# Patient Record
Sex: Female | Born: 1990 | Race: Black or African American | Hispanic: No | Marital: Single | Smoking: Never smoker
Health system: Southern US, Community
[De-identification: ages and names within clinical notes are randomized; demographics above are authoritative.]

## PROBLEM LIST (undated history)

## (undated) ENCOUNTER — Inpatient Hospital Stay (HOSPITAL_COMMUNITY): Payer: Self-pay

## (undated) ENCOUNTER — Emergency Department (HOSPITAL_COMMUNITY): Discharge status: Against Medical Advice | Payer: No Typology Code available for payment source

## (undated) DIAGNOSIS — D509 Iron deficiency anemia, unspecified: Secondary | ICD-10-CM

## (undated) DIAGNOSIS — R10A3 Flank pain, bilateral: Secondary | ICD-10-CM

## (undated) DIAGNOSIS — T8859XA Other complications of anesthesia, initial encounter: Secondary | ICD-10-CM

## (undated) DIAGNOSIS — N182 Chronic kidney disease, stage 2 (mild): Secondary | ICD-10-CM

## (undated) DIAGNOSIS — D126 Benign neoplasm of colon, unspecified: Secondary | ICD-10-CM

## (undated) DIAGNOSIS — R011 Cardiac murmur, unspecified: Secondary | ICD-10-CM

## (undated) DIAGNOSIS — E876 Hypokalemia: Secondary | ICD-10-CM

## (undated) DIAGNOSIS — Z8759 Personal history of other complications of pregnancy, childbirth and the puerperium: Secondary | ICD-10-CM

## (undated) DIAGNOSIS — Z932 Ileostomy status: Secondary | ICD-10-CM

## (undated) DIAGNOSIS — T4145XA Adverse effect of unspecified anesthetic, initial encounter: Secondary | ICD-10-CM

## (undated) DIAGNOSIS — Z87442 Personal history of urinary calculi: Secondary | ICD-10-CM

## (undated) DIAGNOSIS — D649 Anemia, unspecified: Secondary | ICD-10-CM

## (undated) DIAGNOSIS — R109 Unspecified abdominal pain: Secondary | ICD-10-CM

## (undated) DIAGNOSIS — Z8619 Personal history of other infectious and parasitic diseases: Secondary | ICD-10-CM

## (undated) DIAGNOSIS — Z87448 Personal history of other diseases of urinary system: Secondary | ICD-10-CM

## (undated) DIAGNOSIS — N281 Cyst of kidney, acquired: Secondary | ICD-10-CM

## (undated) DIAGNOSIS — R3915 Urgency of urination: Secondary | ICD-10-CM

## (undated) DIAGNOSIS — N200 Calculus of kidney: Secondary | ICD-10-CM

## (undated) DIAGNOSIS — C189 Malignant neoplasm of colon, unspecified: Secondary | ICD-10-CM

## (undated) HISTORY — PX: LEG RECONSTRUCTION USING FASCIAL FLAP: SHX1962

## (undated) HISTORY — PX: ABDOMINAL SURGERY: SHX537

## (undated) HISTORY — PX: OTHER SURGICAL HISTORY: SHX169

---
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
## 2009-05-31 ENCOUNTER — Inpatient Hospital Stay (HOSPITAL_COMMUNITY): Admission: AD | Admit: 2009-05-31 | Discharge: 2009-05-31 | Payer: Self-pay | Admitting: Pediatrics

## 2010-05-04 ENCOUNTER — Emergency Department (HOSPITAL_COMMUNITY): Admission: EM | Admit: 2010-05-04 | Discharge: 2010-05-04 | Payer: Self-pay | Admitting: Family Medicine

## 2010-08-18 ENCOUNTER — Emergency Department (HOSPITAL_COMMUNITY): Admission: EM | Admit: 2010-08-18 | Discharge: 2010-08-18 | Payer: Self-pay | Admitting: Emergency Medicine

## 2010-08-23 ENCOUNTER — Inpatient Hospital Stay (HOSPITAL_COMMUNITY): Admission: RE | Admit: 2010-08-23 | Discharge: 2010-08-24 | Payer: Self-pay | Admitting: Orthopedic Surgery

## 2010-08-23 HISTORY — PX: IM NAILING TIBIA: SUR734

## 2011-02-01 ENCOUNTER — Other Ambulatory Visit: Payer: Self-pay | Admitting: Obstetrics and Gynecology

## 2011-02-09 LAB — TYPE AND SCREEN
ABO/RH(D): A POS
Antibody Screen: NEGATIVE

## 2011-02-09 LAB — BASIC METABOLIC PANEL
BUN: 3 mg/dL — ABNORMAL LOW (ref 6–23)
CO2: 27 mEq/L (ref 19–32)
Calcium: 8.6 mg/dL (ref 8.4–10.5)
Chloride: 107 mEq/L (ref 96–112)
Creatinine, Ser: 0.6 mg/dL (ref 0.4–1.2)
GFR calc Af Amer: >60 mL/min (ref >60)
GFR calc non Af Amer: >60 mL/min (ref >60)
Glucose, Bld: 119 mg/dL — ABNORMAL HIGH (ref 70–99)
Potassium: 3.5 mEq/L (ref 3.5–5.1)
Sodium: 137 mEq/L (ref 135–145)

## 2011-02-09 LAB — CBC
HCT: 25.4 % — ABNORMAL LOW (ref 36.0–46.0)
HCT: 28.5 % — ABNORMAL LOW (ref 36.0–46.0)
Hemoglobin: 7.3 g/dL — ABNORMAL LOW (ref 12.0–15.0)
Hemoglobin: 8.2 g/dL — ABNORMAL LOW (ref 12.0–15.0)
MCH: 17.1 pg — ABNORMAL LOW (ref 26.0–34.0)
MCH: 17.4 pg — ABNORMAL LOW (ref 26.0–34.0)
MCHC: 28.7 g/dL — ABNORMAL LOW (ref 30.0–36.0)
MCHC: 28.8 g/dL — ABNORMAL LOW (ref 30.0–36.0)
MCV: 59.5 fL — ABNORMAL LOW (ref 78.0–100.0)
MCV: 60.5 fL — ABNORMAL LOW (ref 78.0–100.0)
Platelets: 315 10*3/uL (ref 150–400)
Platelets: 363 10*3/uL (ref 150–400)
RBC: 4.27 MIL/uL (ref 3.87–5.11)
RBC: 4.71 MIL/uL (ref 3.87–5.11)
RDW: 17.6 % — ABNORMAL HIGH (ref 11.5–15.5)
RDW: 17.7 % — ABNORMAL HIGH (ref 11.5–15.5)
WBC: 10 10*3/uL (ref 4.0–10.5)
WBC: 10.1 10*3/uL (ref 4.0–10.5)

## 2011-02-09 LAB — SURGICAL PCR SCREEN: Staphylococcus aureus: POSITIVE — AB

## 2011-03-05 LAB — CBC
Hemoglobin: 10.1 g/dL — ABNORMAL LOW (ref 12.0–15.0)
MCHC: 32.3 g/dL (ref 30.0–36.0)
RBC: 4.81 MIL/uL (ref 3.87–5.11)
WBC: 9 10*3/uL (ref 4.0–10.5)

## 2011-03-05 LAB — URINALYSIS, ROUTINE W REFLEX MICROSCOPIC
Bilirubin Urine: NEGATIVE
Glucose, UA: NEGATIVE mg/dL
Ketones, ur: NEGATIVE mg/dL
Leukocytes, UA: NEGATIVE
Protein, ur: NEGATIVE mg/dL

## 2011-03-05 LAB — URINE MICROSCOPIC-ADD ON

## 2011-03-05 LAB — DIFFERENTIAL
Lymphocytes Relative: 20 % (ref 12–46)
Lymphs Abs: 1.8 10*3/uL (ref 0.7–4.0)
Monocytes Absolute: 0.7 10*3/uL (ref 0.1–1.0)
Monocytes Relative: 8 % (ref 3–12)
Neutro Abs: 6.4 10*3/uL (ref 1.7–7.7)
Neutrophils Relative %: 71 % (ref 43–77)

## 2011-03-05 LAB — WET PREP, GENITAL: Trich, Wet Prep: NONE SEEN

## 2011-03-05 LAB — GC/CHLAMYDIA PROBE AMP, GENITAL
Chlamydia, DNA Probe: NEGATIVE
GC Probe Amp, Genital: NEGATIVE

## 2011-03-05 LAB — POCT PREGNANCY, URINE: Preg Test, Ur: NEGATIVE

## 2011-04-11 NOTE — Consult Note (Signed)
Cindy [name], Cindy [name]              ACCOUNT NO.:  [id number]   MEDICAL RECORD NO.:  [id number]          PATIENT TYPE:  INP   LOCATION:  [id number]                         FACILITY:  [facility]   PHYSICIAN:  Sharlet Salina, M.D.   DATE OF BIRTH:  1991/09/10   DATE OF CONSULTATION:  12/24/2007  DATE OF DISCHARGE:                                 CONSULTATION   REASON FOR CONSULTATION:  Asked to see patient for pneumonia.   CHIEF COMPLAINT:  Shortness of breath.   HISTORY OF PRESENT ILLNESS:  The patient is an 20 year old African-  American female that first presented to [facility] with  abdominal pain, treated for urinary tract infection with Macrodantin and  Pyridium.  She states that her urinary symptoms improved after a couple  of days, but she has persistent right lower quadrant pain that gradually  worsened over the week.  She presented again for evaluation and was  noted on CAT scan to have acute appendicitis.   PAST MEDICAL HISTORY:  No significant Past Medical History except for  the UTI.   FAMILY HISTORY:  She is adopted.   SOCIAL HISTORY:  She is a Ship broker.  No alcohol or tobacco use.   MEDICATIONS:  None prior to the Macrodantin and Pyridium for UTI.   ALLERGIES:  None.   REVIEW OF SYSTEMS:  The patient complains of shortness of breath and  cough productive of yellow mucus.   PHYSICAL EXAMINATION:  VITAL SIGNS:  Temperature 102.5, pulse 103,  respirations 16, blood pressure 101/57, pulse oximetry 95% on room air.  HEENT:  Head is normocephalic and atraumatic.  Pupils equal, round, and  reactive to light.  Throat without erythema.  CARDIOVASCULAR:  Regular rate and rhythm.  LUNGS:  Decreased breath sounds in the bases.  ABDOMEN:  Soft, nontender.  Positive bowel sounds.  EXTREMITIES:  No edema.   LABORATORY DATA:  Hemoglobin 8.1, hematocrit 25.2.  D-dimer 10.2.  Sodium 142, potassium 3.5, chloride 112, CO2 24, BUN 3, creatinine 0.81,  glucose 104.  WBC 9.9,  hemoglobin 7.8, platelets 455.   Spiral CT showed no PE, bilateral lower lobe consolidation, right  greater than left, aspiration or multilobar pneumonia, small bilateral  pleural effusion.   ASSESSMENT AND PLAN:  1. Multilobar pneumonia.  Continue Zosyn.  Add nebulizer treatment.      Will get a blood gas.  Will do blood cultures x2 and Tylenol p.r.n.      for fever.  Will continue IV fluids and encourage use of incentive      spirometer.  2. Urinary tract infection.  Will be treated after 3 days for 1 more      day of therapy.  3. Appendicitis status post surgery.  Manage per surgery.  4. Anemia.  The patient will be transfused 1 unit of blood today.      Sharlet Salina, M.D.  Electronically Signed     NJ/MEDQ  D:  12/24/2007  T:  12/24/2007  Job:  [id number]

## 2011-04-11 NOTE — H&P (Signed)
NAMEANGELLY, Cindy [name] NO.:  [id number]   MEDICAL RECORD NO.:  [id number]          PATIENT TYPE:  INP   LOCATION:  5032                         FACILITY:  [facility]   PHYSICIAN:  Marland Kitchen T. Hoxworth, M.D.DATE OF BIRTH:  [birth year]   DATE OF ADMISSION:  12/22/2007  DATE OF DISCHARGE:                              HISTORY & PHYSICAL   CHIEF COMPLAINT:  Right lower quadrant abdominal pain.   HISTORY OF PRESENT ILLNESS:  Ms. [name] is an 20 year old African  American female who about a week ago had the gradual onset of right  lower quadrant abdominal pain.  This was associated with some urinary  frequency as well.  She presented to the [facility] Emergency Room. She  was evaluated.  She was felt to have a urinary tract infection, and was  started on Macrodantin.  She states her urinary symptoms improved after  a couple days, but she had persistent right lower quadrant pain.  This  pain has been constant, and has gradually worsened over the week.  She  has not had any nausea or vomiting, but somewhat decreased appetite.  She gets some mid abdominal pain which she tries to eat.  She has no  history of any similar symptoms or chronic GI complaints.  She has had  some fever recently.   PAST MEDICAL HISTORY:  Negative for previous medical or surgical  illnesses except for one previous UTI.   MEDICATIONS:  Currently and Macrodantin and ES28.   ALLERGIES:  None.   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   FAMILY HISTORY:  Parents alive and well.   REVIEW OF SYSTEMS:  All negative except as above.   PHYSICAL EXAM:  VITAL SIGNS: Temperature 102, pulse 129, respirations  16, blood pressure is 148/86.  GENERAL: She appears to be a mildly ill well-developed female.  SKIN:  Warm and dry.  No rash or infection.  HEENT:  No palpable mass or thyromegaly.  Sclerae nonicteric.  Oropharynx clear.  LUNGS:  Clear without wheezing or increased work of breathing.  CARDIAC:  Regular tachycardia.  No murmurs.  ABDOMEN:  Bowel sounds are present.  There is tenderness in the right  mid abdomen and right lower quadrant and mild tenderness in the left  lower quadrant.  Some mild guarding.  No peritoneal signs.  No masses.  EXTREMITIES:  No joint swelling for neurologic.  NEUROLOGIC:  Alert and oriented.  Motor and sensory exam is grossly  normal.   LABORATORY:  White count is mildly elevated at 10.9, hemoglobin 9.  Urinalysis shows 21-50 white cells, 3-6 red cells, and positive  bacteria.  Also positive ketones, negative leukocyte esterase.  Pregnancy test negative.  A CT scan of the abdomen and pelvis is  reviewed.  This shows some definite thickening and periappendiceal  inflammatory change at the tip of the appendix felt to represent early  appendicitis.   ASSESSMENT/PLAN:  An 20 year old female with somewhat confusing course  of 1-week of right lower quadrant pain, what appears to be a urinary  tract infection, but persistent pain and tenderness despite treatment  of  her urinary tract infection, and CT scan indicating appendicitis.  I  think, that it is possible that she has a partially treated appendicitis  secondary to her antibiotics.  She is tender, febrile, and with these  abnormal CT findings I believe that appendectomy would be the best  course.  I have recommended proceeding with laparoscopic appendectomy  and she is to be admitted for this procedure.  She is receiving broad-  spectrum antibiotics preoperatively.      Darene [name]. Hoxworth, M.D.  Electronically Signed     BTH/MEDQ  D:  12/23/2007  T:  12/23/2007  Job:  [id number]

## 2011-04-11 NOTE — Op Note (Signed)
Cindy [name], Cindy [name] NO.:  [id number]   MEDICAL RECORD NO.:  [id number]          PATIENT TYPE:  INP   LOCATION:  5032                         FACILITY:  [facility]   PHYSICIAN:  Marland Kitchen T. Hoxworth, M.D.DATE OF BIRTH:  Jan 23, 1991   DATE OF PROCEDURE:  12/23/2007  DATE OF DISCHARGE:                               OPERATIVE REPORT   PRE AND POSTOPERATIVE DIAGNOSIS:  Acute appendicitis.   SURGICAL PROCEDURES:  Laparoscopic appendectomy.   SURGEON:  Darene Lamer. Hoxworth, M.D.   ANESTHESIA:  General.   BRIEF HISTORY:  Ms. Klostermann is an 20 year old female who about one week  ago developed the onset of right lower quadrant abdominal pain.  She is  also having some dysuria at that time.  She was evaluated at [facility]  emergency room, felt to have a UTI and treated with Macrodantin.  She  states her urinary symptoms improved but she continues to have right  lower quadrant abdominal pain.  The pain has actually worsened somewhat  gradually.  She gets some mid abdominal pain when she eats.  She  presented to [facility] emergency room.  Workup has included a CT scan showing  thickening inflammatory change around the tip of the appendix consistent  with early appendicitis.  She is febrile and has local tenderness in the  right lower quadrant.  She, however, continues to have some white cells  in her urine.  I have recommended proceeding with laparoscopic  appendectomy for what appears to be possibly partially treated  appendicitis with her having been on antibiotics for about a week.  The  nature of the procedure, indications, possible diagnoses, risks of  bleeding, infection, open procedure were discussed and understood.  She  is now brought to operating room for this procedure.   DESCRIPTION OF OPERATION:  The patient brought to operating room, placed  in supine position on the operating table and general orotracheal  anesthesia was induced.  Foley catheter was placed.  The  abdomen was  widely sterilely prepped and draped.  She received preoperative  antibiotics.  Correct patient and procedure were verified.  Local  anesthesia was used to infiltrate the trocar sites prior to the  incision.  A 1 cm incision made at the umbilicus.  Dissection carried  down to midline fascia which sharply incised 1 cm and the peritoneum  entered under direct vision.  Through a mattress suture of 0-0 Vicryl  the Hasson trocar was placed and pneumoperitoneum established.  Under  direct vision a 5 mm trocar was placed in the right upper quadrant and  12 mL trocar in the left lower quadrant.  There was obvious inflammatory  change in the pelvis and mostly localized in the right lower quadrant  with some exudate over bowel loops and some filmy and inflammatory  adhesions between loops of terminal ileum.  The uterus and the adnexa  appeared somewhat secondarily inflamed but otherwise normal.  Inflammatory adhesions between loops of terminal ileum were broken up  and there was more exudate right at the extreme terminal ileum overlying  the appendix.  The appendix was exposed  and elevated and the tip  appeared clearly thickened and inflamed but without obvious perforation.  Most of the length of the appendix appeared relatively normal more  proximal to this.  The appendix was elevated and mobilized dividing  lateral peritoneal attachments.  The mesentery was then sequentially  divided with harmonic scalpel.  The appendix completely freed down to  its base at which point it was divided at the tip of the cecum with a  single firing of the 35 mm linear stapler, placed in EndoCatch bag and  removed.  Staple line was intact and no bleeding.  The abdomen was then  thoroughly  irrigated until clear.  The trocars removed, CO2 evacuated.  Mattress  suture secured at the umbilicus.  Skin incisions were closed with  interrupted subcuticular Monocryl and Dermabond.  Sponge, needle and  instrument  counts were correct.  The patient taken recovery in good  condition.      Darene Lamer. Hoxworth, M.D.  Electronically Signed     BTH/MEDQ  D:  12/23/2007  T:  12/23/2007  Job:  [id number]

## 2011-04-14 NOTE — Discharge Summary (Signed)
NAMELENYA, WERDEN NO.:  [id number]   MEDICAL RECORD NO.:  [id number]          PATIENT TYPE:  INP   LOCATION:  [id number]                         FACILITY:  [facility]   PHYSICIAN:  Hassell Done, MD             DATE OF BIRTH:  [birth year]   DATE OF ADMISSION:  12/22/2007  DATE OF DISCHARGE:  12/28/2007                               DISCHARGE SUMMARY   DISCHARGING PHYSICIAN:  Dr. Hassell Done.   OPERATING SURGEON:  Marland Kitchen T. Hoxworth, M.D.   CONSULTATIONSSharlet Salina, M.D. with [facility] Team [facility].   REASON FOR ADMISSION:  Ms. Stacy is an 20 year old African-American  female who about a week ago had a gradual onset of right lower quadrant  abdominal pain.  This was associated with some urinary frequency.  She  presented to [facility] Emergency Room where she was evaluated and felt  to have an urinary tract infection and was started on Macrodantin.  At  this time, she says her urinary symptoms have improved, but has had  persistent right lower quadrant pain.  The pain has been constant and  gradually worsening over the past week.  She denies any nausea or  vomiting, but does have somewhat of a decreased appetite.  She does  state that she gets some mid abdominal pain when she does try to eat.  She also did state that she has had some fever recently.  On physical  exam, the patient does have bowel sounds.  She is tender in her right  mid abdomen and right lower quadrant with mild tenderness in her left  lower quadrant.  There is some mild guarding.  However, no peritoneal  signs are present and no masses are present.   LABORATORY DATA:  White blood cell count was mildly elevated at 10.9.  Urinalysis shows 21-50 white blood cells, 36 red cells and positive for  bacteria.   DIAGNOSTICS:  A CT scan of her abdomen and pelvis was reviewed and  showed some definite thickening, appendiceal inflammatory changes at the  tip of the appendix which was felt to represent early  appendicitis.  At  this time, the patient was admitted and an appendectomy was performed.   ADMISSION DIAGNOSES:  1. Acute appendicitis  2. Urinary tract infection.   HOSPITAL COURSE:  After the patient's surgery on postoperative day one-  half, the patient was still sleepy, was still complaining of a sore  throat and some pain with coughing.  At this time, she would only drink  ginger ale.  However, she was not feeling very hungry at that time.  Her  abdomen was soft, slightly tender with positive bowel sounds.  At this  time, her diet was advanced as tolerated.  She was started on p.o. pain  medicines as well as her IV antibiotics were continued in the meantime  while her urine culture was pending.  Later that day, the patient's  temperature increased to 102 degrees and the patient was given 2  Percocet.  However when her temperature was rechecked, this had not  resolved her temperature  at all and an order for Toradol was given to  help control her fever.  By postoperative day 2, the patient was still  having fevers with a T-max at 102.5.  At this time, she was tachycardiac  with a heart rate of 101 and her oxygen saturations had decreased to 85%  on 5 liters of oxygen.  The patient states that she is okay.  However,  she says that she is having multiple coughing episodes which are hurting  her abdomen.  At this time because her oxygen saturations were so low,  the patient was assessed for possible DVTs that might have called  pulmonary emboli.  The patient at this time states that she has not had  any swelling, redness or pain in either one of her calves.  At this  point, she had a negative Homans' sign.  A multitude of tests were run  including a STAT  D-dimer, CBC, CMET and a chest CT.  Also due to the  possibility of a DVT/PE, the patient was started on DVT protocol with  Lovenox 1 mg/kg q.12 h.  At this time,  respiratory therapy also got  involved and began giving the patient  several breathing treatments.  Later that day, her CT of her chest came back which showed bilateral  lower lobe consolidations with left greater than right.  The patient at  this time also showed some signs of anemia with a hemoglobin of 8.1 and  was given an unit of blood.  Also based on her urine cultures and now  her new pneumonia, Unasyn was stopped and Zosyn was started to cover  both her urinary tract infection and her lungs.  Since no pulmonary  embolus was found, DVT protocol with Lovenox was stopped and the patient  was just started on a regular dose of 40 mg of Lovenox every day.  CBC  at this time only showed a white cell count of 9,900.  A D-dimer was  elevated at that time at 10.02.   Because of the patient's continued medical problems,  [facility] was  consulted to help follow along and help [place] treat the patient's pneumonia  and complicated UTI.  On postoperative day 3, the patient continued to  [place].  At this time was only satting at about  84-85% on 5 liters of  oxygen.  At this time, respiratory therapy put a non-rebreather mask on  the patient and her sats improved to 97%.  Because of the patient's  inability to stay saturated on anything but a non-rebreather, the  patient's condition was discussed with Dr. Marlou Starks.  At that time, we felt  like the patient needed to be transferred off the regular floor and put  in either a step-down or an  ICU bed.  The patient was later that day  transferred to 2100.  Among her other problems, however, her abdomen was  doing well and at this time had a completely benign exam.  Her incisions  were stable without erythema or drainage.  She no longer had any pain.  At this time, she was also tolerating a solid diet.  On postoperative  day 4, the patient was doing a little bit better and her oxygen was  starting to be titrated at this time so that we could get her back down  to just using a nasal cannula instead of being on the  non-rebreather  mask.  The patient states at this time that she was also feeling  better  and not having asthma or shortness of breath.  By the next day, the  patient says that she is feeling even better and is now satting at 100%  on 5 liters of nasal cannula.  At this time, her abdomen is stable and  she is still continuing to tolerate a solid diet.  She is also continued  on her IV antibiotics.  She was also at this time transferred back to a  regular floor.  By postoperative day 6, the patient was feeling much  better and now satting at 91-96% on room air.  The patient was  tolerating a diet and was wanting to go home at this time.  Based on the  examination of the [facility] physician at that time, he felt that the  patient was stable from a pulmonary standpoint and the patient was  discharged at that time.   DISCHARGE DIAGNOSES:  1. Acute appendicitis status post laparoscopic appendectomy.  2. Bilateral pneumonia.  3. Urinary tract infection which had been treated and was resolving at      that time.  4. Anemia which had been treated at time of discharge.  Her hemoglobin      and hematocrit were stable.   DISCHARGE MEDICATIONS:  The patient was not on any home medications.  However, she is sent out on Avelox 400 mg daily for 5 days.   DISCHARGE INSTRUCTIONS:  The patient was told that she had no diet  restrictions and needed to increase her activity slowly.  At this time,  she was able to take a shower.  However, was told that she was not able  to lift anything greater than 15 pounds for the next 2 weeks.  She was  also informed that she did not need to do any driving while taking any  narcotic pain medication.  She was also informed that she needed to  follow up with Dr. Jonelle Sidle for  management of her pneumonia in 1 week.  She was also told to follow up  with Dr. Excell Seltzer in 2 weeks for her appendectomy.  She was also  informed that if she began to get fevers, worsening abdominal  pain, any  redness or pus-like drainage around her incisions, that she was to call  [facility] to speak with the doctor.      Henreitta Cea, PA    ______________________________  Hassell Done, MD    KEO/MEDQ  D:  01/17/2008  T:  01/18/2008  Job:  [id number]   cc:   Darene Lamer. Hoxworth, M.D.  Sharlet Salina, M.D.  Barbette Merino, M.D.

## 2011-05-05 ENCOUNTER — Inpatient Hospital Stay (INDEPENDENT_AMBULATORY_CARE_PROVIDER_SITE_OTHER)
Admission: RE | Admit: 2011-05-05 | Discharge: 2011-05-05 | Disposition: A | Discharge status: Home / Self Care | Payer: BC Managed Care – PPO | Source: Ambulatory Visit | Attending: Emergency Medicine | Admitting: Emergency Medicine

## 2011-05-05 DIAGNOSIS — N739 Female pelvic inflammatory disease, unspecified: Secondary | ICD-10-CM

## 2011-05-05 LAB — POCT URINALYSIS DIP (DEVICE)
Glucose, UA: NEGATIVE mg/dL
Ketones, ur: 40 mg/dL — AB
Nitrite: NEGATIVE
Protein, ur: >=300 mg/dL — AB
Specific Gravity, Urine: >=1.03 (ref 1.005–1.030)
Urobilinogen, UA: 0.2 mg/dL (ref 0.0–1.0)
pH: 6 (ref 5.0–8.0)

## 2011-05-05 LAB — POCT PREGNANCY, URINE: Preg Test, Ur: NEGATIVE

## 2011-05-05 LAB — WET PREP, GENITAL: Yeast Wet Prep HPF POC: NONE SEEN

## 2011-05-06 LAB — URINE CULTURE
Culture  Setup Time: 201206082123
Culture: NO GROWTH

## 2011-08-17 LAB — CBC
HCT: 23.9 — ABNORMAL LOW
HCT: 27.9 — ABNORMAL LOW
HCT: 28.9 — ABNORMAL LOW
Hemoglobin: 7.8 — CL
Hemoglobin: 9 — ABNORMAL LOW
Hemoglobin: 9.3 — ABNORMAL LOW
MCHC: 32
MCHC: 32.8
MCV: 67.8 — ABNORMAL LOW
MCV: 68.2 — ABNORMAL LOW
MCV: 73.1 — ABNORMAL LOW
Platelets: 453 — ABNORMAL HIGH
RBC: 3.5 — ABNORMAL LOW
RBC: 3.96
RDW: 20.3 — ABNORMAL HIGH
RDW: 21.6 — ABNORMAL HIGH
WBC: 10.9 — ABNORMAL HIGH
WBC: 8.9

## 2011-08-17 LAB — CROSSMATCH: ABO/RH(D): A POS

## 2011-08-17 LAB — BLOOD GAS, ARTERIAL
Acid-base deficit: 0.2
Bicarbonate: 23.7
Drawn by: 275531
Drawn by: 280971
FIO2: 100
O2 Saturation: 86.9
O2 Saturation: 98.7
Patient temperature: 98.6
pCO2 arterial: 39.1
pH, Arterial: 7.404 — ABNORMAL HIGH
pO2, Arterial: 173 — ABNORMAL HIGH
pO2, Arterial: 56.3 — ABNORMAL LOW

## 2011-08-17 LAB — DIFFERENTIAL
Basophils Relative: 0
Eosinophils Absolute: 0
Lymphocytes Relative: 16
Lymphocytes Relative: 5 — ABNORMAL LOW
Lymphs Abs: 0.5 — ABNORMAL LOW
Lymphs Abs: 1.3
Monocytes Relative: 6
Monocytes Relative: 9
Neutro Abs: 6.3
Neutro Abs: 9.4 — ABNORMAL HIGH
Neutrophils Relative %: 77

## 2011-08-17 LAB — COMPREHENSIVE METABOLIC PANEL
ALT: 8
BUN: 1 — ABNORMAL LOW
BUN: 3 — ABNORMAL LOW
CO2: 23
CO2: 24
Calcium: 7.7 — ABNORMAL LOW
Calcium: 7.7 — ABNORMAL LOW
Creatinine, Ser: 0.66
Creatinine, Ser: 0.81
GFR calc Af Amer: >60
GFR calc non Af Amer: >60
GFR calc non Af Amer: >60
Glucose, Bld: 104 — ABNORMAL HIGH
Glucose, Bld: 117 — ABNORMAL HIGH
Total Bilirubin: 0.3
Total Protein: 5.5 — ABNORMAL LOW

## 2011-08-17 LAB — URINE CULTURE: Colony Count: >=100000

## 2011-08-17 LAB — URINE MICROSCOPIC-ADD ON

## 2011-08-17 LAB — POCT URINALYSIS DIP (DEVICE)
Ketones, ur: >=160 — AB
Operator id: 235561
Specific Gravity, Urine: 1.025
pH: 5.5

## 2011-08-17 LAB — FERRITIN: Ferritin: 132 (ref 10–291)

## 2011-08-17 LAB — I-STAT 8, (EC8 V) (CONVERTED LAB)
BUN: 7
Chloride: 107
Glucose, Bld: 98
Hemoglobin: 10.9 — ABNORMAL LOW
Potassium: 3.2 — ABNORMAL LOW
Sodium: 139

## 2011-08-17 LAB — CULTURE, BLOOD (ROUTINE X 2)
Culture: NO GROWTH
Culture: NO GROWTH

## 2011-08-17 LAB — BASIC METABOLIC PANEL
BUN: 1 — ABNORMAL LOW
Calcium: 8.1 — ABNORMAL LOW
GFR calc non Af Amer: >60
Glucose, Bld: 107 — ABNORMAL HIGH
Sodium: 142

## 2011-08-17 LAB — RETICULOCYTES
RBC.: 3.84 — ABNORMAL LOW
Retic Count, Absolute: 11.5 — ABNORMAL LOW

## 2011-08-17 LAB — VITAMIN B12: Vitamin B-12: 723 (ref 211–911)

## 2011-08-17 LAB — URINALYSIS, ROUTINE W REFLEX MICROSCOPIC
Glucose, UA: NEGATIVE
Ketones, ur: 15 — AB
Protein, ur: NEGATIVE
Urobilinogen, UA: 1

## 2011-08-17 LAB — HEMOGLOBIN AND HEMATOCRIT, BLOOD: HCT: 25.2 — ABNORMAL LOW

## 2011-08-17 LAB — IRON AND TIBC: Iron: <10 — ABNORMAL LOW

## 2011-08-17 LAB — GC/CHLAMYDIA PROBE AMP, GENITAL: Chlamydia, DNA Probe: POSITIVE — AB

## 2011-08-17 LAB — D-DIMER, QUANTITATIVE: D-Dimer, Quant: 10.02 — ABNORMAL HIGH

## 2011-08-17 LAB — WET PREP, GENITAL: Trich, Wet Prep: NONE SEEN

## 2011-08-17 LAB — POCT PREGNANCY, URINE: Operator id: 235561

## 2011-08-17 LAB — POCT I-STAT CREATININE
Creatinine, Ser: 0.9
Operator id: 151321

## 2011-08-18 LAB — URINALYSIS, ROUTINE W REFLEX MICROSCOPIC
Nitrite: NEGATIVE
Specific Gravity, Urine: 1.022
Urobilinogen, UA: 1

## 2011-08-18 LAB — CBC
MCHC: 32.1
MCV: 69.5 — ABNORMAL LOW
Platelets: 408 — ABNORMAL HIGH
RBC: 4.97

## 2011-08-18 LAB — DIFFERENTIAL
Basophils Relative: 0
Eosinophils Absolute: 0.1
Monocytes Absolute: 1.1 — ABNORMAL HIGH
Neutro Abs: 10.5 — ABNORMAL HIGH
Neutrophils Relative %: 78 — ABNORMAL HIGH

## 2011-08-18 LAB — BASIC METABOLIC PANEL
BUN: 12
CO2: 25
Chloride: 105
Creatinine, Ser: 0.68
Glucose, Bld: 95

## 2011-08-18 LAB — URINE MICROSCOPIC-ADD ON

## 2011-09-05 LAB — I-STAT 8, (EC8 V) (CONVERTED LAB)
Acid-base deficit: 4 — ABNORMAL HIGH
BUN: 4 — ABNORMAL LOW
Bicarbonate: 22
Chloride: 110
Glucose, Bld: 98
HCT: 40
Hemoglobin: 13.6
Operator id: 294341
Potassium: 3.7
Sodium: 143
TCO2: 23
pCO2, Ven: 44 — ABNORMAL LOW
pH, Ven: 7.306 — ABNORMAL HIGH

## 2011-09-05 LAB — DIFFERENTIAL
Basophils Relative: 0
Eosinophils Absolute: 0.1 — ABNORMAL LOW
Eosinophils Relative: 1
Monocytes Relative: 10
Neutrophils Relative %: 81 — ABNORMAL HIGH

## 2011-09-05 LAB — CBC
HCT: 37
MCHC: 32
MCV: 71.8 — ABNORMAL LOW
Platelets: 411 — ABNORMAL HIGH
RBC: 5.15 — ABNORMAL HIGH

## 2011-09-05 LAB — URINALYSIS, ROUTINE W REFLEX MICROSCOPIC
Bilirubin Urine: NEGATIVE
Ketones, ur: NEGATIVE
Protein, ur: NEGATIVE
Urobilinogen, UA: 0.2

## 2011-09-05 LAB — POCT PREGNANCY, URINE
Operator id: 294341
Preg Test, Ur: NEGATIVE

## 2011-09-05 LAB — POCT I-STAT CREATININE
Creatinine, Ser: 0.8
Operator id: 294341

## 2011-09-06 LAB — GC/CHLAMYDIA PROBE AMP, GENITAL: Chlamydia, DNA Probe: POSITIVE — AB

## 2011-09-06 LAB — WET PREP, GENITAL

## 2011-11-09 ENCOUNTER — Emergency Department (HOSPITAL_COMMUNITY)
Admission: EM | Admit: 2011-11-09 | Discharge: 2011-11-09 | Disposition: A | Discharge status: Home / Self Care | Payer: Managed Care, Other (non HMO) | Attending: Emergency Medicine | Admitting: Emergency Medicine

## 2011-11-09 ENCOUNTER — Emergency Department (HOSPITAL_COMMUNITY): Payer: Managed Care, Other (non HMO)

## 2011-11-09 ENCOUNTER — Encounter: Payer: Self-pay | Admitting: *Deleted

## 2011-11-09 DIAGNOSIS — R109 Unspecified abdominal pain: Secondary | ICD-10-CM | POA: Insufficient documentation

## 2011-11-09 DIAGNOSIS — N12 Tubulo-interstitial nephritis, not specified as acute or chronic: Secondary | ICD-10-CM | POA: Insufficient documentation

## 2011-11-09 DIAGNOSIS — R300 Dysuria: Secondary | ICD-10-CM | POA: Insufficient documentation

## 2011-11-09 DIAGNOSIS — R110 Nausea: Secondary | ICD-10-CM | POA: Insufficient documentation

## 2011-11-09 DIAGNOSIS — N200 Calculus of kidney: Secondary | ICD-10-CM | POA: Insufficient documentation

## 2011-11-09 LAB — URINALYSIS, ROUTINE W REFLEX MICROSCOPIC
Bilirubin Urine: NEGATIVE
Nitrite: POSITIVE — AB
Specific Gravity, Urine: 1.018 (ref 1.005–1.030)
Urobilinogen, UA: 0.2 mg/dL (ref 0.0–1.0)
pH: 6 (ref 5.0–8.0)

## 2011-11-09 LAB — DIFFERENTIAL
Lymphocytes Relative: 4 % — ABNORMAL LOW (ref 12–46)
Lymphs Abs: 0.5 10*3/uL — ABNORMAL LOW (ref 0.7–4.0)
Monocytes Relative: 8 % (ref 3–12)
Neutro Abs: 13.7 10*3/uL — ABNORMAL HIGH (ref 1.7–7.7)
Neutrophils Relative %: 88 % — ABNORMAL HIGH (ref 43–77)

## 2011-11-09 LAB — CBC
Hemoglobin: 11.5 g/dL — ABNORMAL LOW (ref 12.0–15.0)
MCH: 25.7 pg — ABNORMAL LOW (ref 26.0–34.0)
Platelets: 240 10*3/uL (ref 150–400)
RBC: 4.47 MIL/uL (ref 3.87–5.11)
WBC: 15.5 10*3/uL — ABNORMAL HIGH (ref 4.0–10.5)

## 2011-11-09 LAB — URINE MICROSCOPIC-ADD ON

## 2011-11-09 MED ORDER — SODIUM CHLORIDE 0.9 % IV BOLUS (SEPSIS)
500.0000 mL | Freq: Once | INTRAVENOUS | Status: AC
  Administered 2011-11-09: 500 mL via INTRAVENOUS

## 2011-11-09 MED ORDER — OXYCODONE-ACETAMINOPHEN 5-325 MG PO TABS: 1.0000 | ORAL_TABLET | ORAL | Status: AC | PRN

## 2011-11-09 MED ORDER — MORPHINE SULFATE 2 MG/ML IJ SOLN
4.0000 mg | Freq: Once | INTRAMUSCULAR | Status: AC
  Administered 2011-11-09: 4 mg via INTRAVENOUS

## 2011-11-09 MED ORDER — MORPHINE SULFATE 4 MG/ML IJ SOLN: 4.0000 mg | Freq: Once | INTRAMUSCULAR | Status: DC

## 2011-11-09 MED ORDER — KETOROLAC TROMETHAMINE 30 MG/ML IJ SOLN
30.0000 mg | Freq: Once | INTRAMUSCULAR | Status: AC
  Administered 2011-11-09: 30 mg via INTRAVENOUS
  Filled 2011-11-09: qty 1

## 2011-11-09 MED ORDER — NAPROXEN 375 MG PO TABS: 375.0000 mg | ORAL_TABLET | Freq: Two times a day (BID) | ORAL | Status: DC

## 2011-11-09 MED ORDER — SODIUM CHLORIDE 0.9 % IV SOLN
Freq: Once | INTRAVENOUS | Status: AC
  Administered 2011-11-09: 11:00:00 via INTRAVENOUS

## 2011-11-09 MED ORDER — CIPROFLOXACIN HCL 500 MG PO TABS: 500.0000 mg | ORAL_TABLET | Freq: Two times a day (BID) | ORAL | Status: DC

## 2011-11-09 MED ORDER — ONDANSETRON HCL 4 MG/2ML IJ SOLN
4.0000 mg | Freq: Once | INTRAMUSCULAR | Status: AC
  Administered 2011-11-09: 4 mg via INTRAVENOUS
  Filled 2011-11-09: qty 2

## 2011-11-09 MED ORDER — MORPHINE SULFATE 2 MG/ML IJ SOLN
4.0000 mg | Freq: Once | INTRAMUSCULAR | Status: AC
  Administered 2011-11-09: 4 mg via INTRAVENOUS
  Filled 2011-11-09: qty 2

## 2011-11-09 MED ORDER — MORPHINE SULFATE 2 MG/ML IJ SOLN
INTRAMUSCULAR | Status: AC
  Administered 2011-11-09: 4 mg via INTRAVENOUS
  Filled 2011-11-09: qty 2

## 2011-11-09 MED ORDER — CIPROFLOXACIN IN D5W 400 MG/200ML IV SOLN
400.0000 mg | Freq: Once | INTRAVENOUS | Status: AC
  Administered 2011-11-09: 400 mg via INTRAVENOUS
  Filled 2011-11-09: qty 200

## 2011-11-09 NOTE — ED Notes (Signed)
[id number] Expected date:11/09/11<BR> Expected time: 8:11 AM<BR> Means of arrival:Ambulance<BR> Comments:<BR> UTI

## 2011-11-09 NOTE — ED Notes (Signed)
Per ems: pt c/o right flank pain. Pain has increased since last night. Pain does not radiated. Pt c/o nausea with no vomiting. Frequent urination and cloudy.

## 2011-11-09 NOTE — ED Notes (Signed)
Patient transported to CT 

## 2011-11-09 NOTE — ED Provider Notes (Signed)
History     CSN: [id number] Arrival date & time: 11/09/2011  8:37 AM   First MD Initiated Contact with Patient 11/09/11 0848      Chief Complaint  Patient presents with  . Flank Pain    (Consider location/radiation/quality/duration/timing/severity/associated sxs/prior treatment) HPI Comments: Patient presents with chief complaint of right flank pain.  Pain began yesterday and has worsened.  Patient reports fevers, night sweats, chills, nausea, but denies vomiting, diarrhea, change in bowels.  In addition patient reports urinary symptoms including urinary frequency and dysuria.  Patient denies hematuria.  Patient's primary care is Dr. Dema Severin.   Patient is a 20 y.o. female presenting with flank pain. The history is provided by the patient.  Flank Pain This is a new problem. The current episode started yesterday. The problem occurs constantly. The problem has been gradually worsening. Associated symptoms include anorexia and nausea. Pertinent negatives include no abdominal pain, change in bowel habit, chest pain, chills, coughing, diaphoresis, fatigue, fever, headaches, myalgias, neck pain, rash, sore throat, swollen glands, urinary symptoms, vertigo, visual change, vomiting or weakness. The symptoms are aggravated by stress, standing, walking, sneezing, exertion, coughing and bending. She has tried nothing for the symptoms.    Past Medical History  Diagnosis Date  . UTI (lower urinary tract infection)   . Anemia     Past Surgical History  Procedure Date  . Appendectomy     History reviewed. No pertinent family history.  History  Substance Use Topics  . Smoking status: Never Smoker   . Smokeless tobacco: Never Used  . Alcohol Use: Yes     occa    OB History    Grav Para Term Preterm Abortions TAB SAB Ect Mult Living                  Review of Systems  Constitutional: Negative for fever, chills, diaphoresis and fatigue.  HENT: Negative for sore throat and neck pain.     Respiratory: Negative for cough.   Cardiovascular: Negative for chest pain.  Gastrointestinal: Positive for nausea and anorexia. Negative for vomiting, abdominal pain and change in bowel habit.  Genitourinary: Positive for flank pain.  Musculoskeletal: Negative for myalgias.  Skin: Negative for rash.  Neurological: Negative for vertigo, weakness and headaches.    Allergies  Review of patient's allergies indicates no known allergies.  Home Medications   Current Outpatient Rx  Name Route Sig Dispense Refill  . FERROUS SULFATE 325 (65 FE) MG PO TABS Oral Take 325 mg by mouth daily.      . IBUPROFEN 200 MG PO TABS Oral Take 400 mg by mouth every 8 (eight) hours as needed. For pain.       BP 104/51  Pulse 102  Temp(Src) 100.5 F (38.1 C) (Oral)  Resp 16  Ht 5\' 1"  (1.549 m)  Wt 125 lb (56.7 kg)  BMI 23.62 kg/m2  SpO2 100%  LMP 10/16/2011  Physical Exam  Nursing note and vitals reviewed. Constitutional: She is oriented to person, place, and time. She appears well-developed and well-nourished. She appears distressed.  HENT:  Head: Normocephalic and atraumatic.  Eyes: Conjunctivae and EOM are normal.  Neck: Normal range of motion. Neck supple.  Cardiovascular: Normal rate, regular rhythm and normal heart sounds.   Pulmonary/Chest: Effort normal.  Abdominal: Soft. Bowel sounds are normal. She exhibits no distension, no ascites and no pulsatile midline mass. There is tenderness (CVA tenderness & RLQ tenderness). There is CVA tenderness.  Musculoskeletal: Normal range of motion.  Neurological: She is alert and oriented to person, place, and time.  Skin: Skin is warm and dry. She is not diaphoretic.  Psychiatric: She has a normal mood and affect. Her behavior is normal.    ED Course  Procedures (including critical care time)  Labs Reviewed  CBC - Abnormal; Notable for the following:    WBC 15.5 (*)    Hemoglobin 11.5 (*)    HCT 35.5 (*)    MCH 25.7 (*)    All other  components within normal limits  DIFFERENTIAL - Abnormal; Notable for the following:    Neutrophils Relative 88 (*)    Neutro Abs 13.7 (*)    Lymphocytes Relative 4 (*)    Lymphs Abs 0.5 (*)    Monocytes Absolute 1.2 (*)    All other components within normal limits  URINALYSIS, ROUTINE W REFLEX MICROSCOPIC  POCT PREGNANCY, URINE   Ct Abdomen Pelvis Wo Contrast  11/09/2011  *RADIOLOGY REPORT*  Clinical Data: Right flank pain, nausea, no history of stones, prior appendectomy  CT ABDOMEN AND PELVIS WITHOUT CONTRAST  Technique:  Multidetector CT imaging of the abdomen and pelvis was performed following the standard protocol without intravenous contrast.  Comparison: [place] CT abdomen pelvis dated 12/22/2007  Findings: Lung bases are clear.  Unenhanced liver, spleen, pancreas, and adrenal glands within normal limits.  Gallbladder is unremarkable.  No intrahepatic or extrahepatic ductal dilatation.  At least three nonobstructing right renal calculi measuring up to 3 mm in the right lower pole (series 2/image 40).  At least four nonobstructing left renal calculi, measuring up to 4 mm in the left upper pole (series 2/image 31).  Right extrarenal pelvis.  No hydronephrosis.  No evidence of bowel obstruction.  Status post appendectomy.  No evidence of abdominal aortic aneurysm.  No abdominopelvic ascites.  No suspicious abdominopelvic lymphadenopathy.  Uterus and bilateral ovaries are unremarkable.  No ureteral or bladder calculi.  Visualized osseous structures are within normal limits.  IMPRESSION: Tiny bilateral nonobstructing calculi, measuring up to 3 mm in the right lower pole and 4 mm in the left upper pole.  No hydronephrosis.  No ureteral or bladder calculi.  Original Report Authenticated By: Julian Hy, M.D.     No diagnosis found. Pt has multiple stones in her kidneys bilaterally that are non obstructing. Pts UA came back w infection. D/t pts clinical presentation being febrile, having  CVA tenderness, nausea and RLQ tenderness i will treat with 1 IV dose of abx that covers pyelonephritis. When completed she will be PO fluid challenged at which point i will decide to dc hm or admit.   Patient reevaluated vital signs stable, in no acute distress, patient able to tolerate by mouth fluids.  Patient being discharged with antibiotics, painkillers, and recommendation to followup with Dr. Dema Severin.   MDM  Pyelonephritis         Verl Dicker, [place] 11/09/11 1410

## 2011-11-10 ENCOUNTER — Inpatient Hospital Stay (HOSPITAL_COMMUNITY)
Admission: EM | Admit: 2011-11-10 | Discharge: 2011-11-14 | DRG: 689 | Disposition: A | Discharge status: Home / Self Care | Payer: Managed Care, Other (non HMO) | Attending: Internal Medicine | Admitting: Internal Medicine

## 2011-11-10 ENCOUNTER — Other Ambulatory Visit (HOSPITAL_COMMUNITY): Payer: Self-pay

## 2011-11-10 ENCOUNTER — Encounter (HOSPITAL_COMMUNITY): Payer: Self-pay | Admitting: *Deleted

## 2011-11-10 DIAGNOSIS — J189 Pneumonia, unspecified organism: Secondary | ICD-10-CM | POA: Clinically undetermined

## 2011-11-10 DIAGNOSIS — I059 Rheumatic mitral valve disease, unspecified: Secondary | ICD-10-CM | POA: Diagnosis present

## 2011-11-10 DIAGNOSIS — N10 Acute tubulo-interstitial nephritis: Secondary | ICD-10-CM

## 2011-11-10 DIAGNOSIS — N200 Calculus of kidney: Secondary | ICD-10-CM | POA: Diagnosis present

## 2011-11-10 DIAGNOSIS — R112 Nausea with vomiting, unspecified: Secondary | ICD-10-CM | POA: Diagnosis present

## 2011-11-10 DIAGNOSIS — Z87440 Personal history of urinary (tract) infections: Secondary | ICD-10-CM

## 2011-11-10 DIAGNOSIS — N12 Tubulo-interstitial nephritis, not specified as acute or chronic: Secondary | ICD-10-CM | POA: Diagnosis present

## 2011-11-10 DIAGNOSIS — D509 Iron deficiency anemia, unspecified: Secondary | ICD-10-CM | POA: Diagnosis present

## 2011-11-10 LAB — POCT I-STAT, CHEM 8
BUN: 12 mg/dL (ref 6–23)
Calcium, Ion: 1.19 mmol/L (ref 1.12–1.32)
Chloride: 108 mEq/L (ref 96–112)
HCT: 39 % (ref 36.0–46.0)
Potassium: 3.6 mEq/L (ref 3.5–5.1)

## 2011-11-10 LAB — DIFFERENTIAL
Basophils Absolute: 0 10*3/uL (ref 0.0–0.1)
Lymphocytes Relative: 5 % — ABNORMAL LOW (ref 12–46)
Monocytes Absolute: 2.3 10*3/uL — ABNORMAL HIGH (ref 0.1–1.0)
Monocytes Relative: 12 % (ref 3–12)
Neutro Abs: 15.7 10*3/uL — ABNORMAL HIGH (ref 1.7–7.7)
Neutrophils Relative %: 83 % — ABNORMAL HIGH (ref 43–77)

## 2011-11-10 LAB — CBC
HCT: 36.6 % (ref 36.0–46.0)
Hemoglobin: 12 g/dL (ref 12.0–15.0)
RDW: 15 % (ref 11.5–15.5)
WBC: 19 10*3/uL — ABNORMAL HIGH (ref 4.0–10.5)

## 2011-11-10 MED ORDER — HYDROCODONE-ACETAMINOPHEN 5-325 MG PO TABS
1.0000 | ORAL_TABLET | ORAL | Status: DC | PRN
  Administered 2011-11-11 – 2011-11-12 (×4): 2 via ORAL
  Administered 2011-11-13: 1 via ORAL
  Administered 2011-11-13: 2 via ORAL
  Filled 2011-11-10 (×2): qty 2
  Filled 2011-11-10: qty 1
  Filled 2011-11-10 (×3): qty 2

## 2011-11-10 MED ORDER — MORPHINE SULFATE 4 MG/ML IJ SOLN
4.0000 mg | Freq: Once | INTRAMUSCULAR | Status: DC
  Administered 2011-11-10: 4 mg via INTRAVENOUS

## 2011-11-10 MED ORDER — SODIUM CHLORIDE 0.9 % IV SOLN
Freq: Once | INTRAVENOUS | Status: AC
  Administered 2011-11-10: 20:00:00 via INTRAVENOUS

## 2011-11-10 MED ORDER — HYDROMORPHONE HCL PF 1 MG/ML IJ SOLN
1.0000 mg | INTRAMUSCULAR | Status: DC | PRN
Start: 2011-11-10 — End: 2011-11-14
  Administered 2011-11-11 (×4): 1 mg via INTRAVENOUS
  Filled 2011-11-10 (×4): qty 1

## 2011-11-10 MED ORDER — MORPHINE SULFATE 2 MG/ML IJ SOLN
4.0000 mg | Freq: Once | INTRAMUSCULAR | Status: AC
  Administered 2011-11-10: 4 mg via INTRAVENOUS
  Filled 2011-11-10: qty 2

## 2011-11-10 MED ORDER — ONDANSETRON HCL 4 MG PO TABS
4.0000 mg | ORAL_TABLET | Freq: Four times a day (QID) | ORAL | Status: DC | PRN
  Administered 2011-11-11 – 2011-11-12 (×2): 4 mg via ORAL
  Filled 2011-11-10 (×2): qty 1

## 2011-11-10 MED ORDER — DEXTROSE-NACL 5-0.9 % IV SOLN
INTRAVENOUS | Status: DC
  Administered 2011-11-10: 125 mL/h via INTRAVENOUS
  Administered 2011-11-11 – 2011-11-12 (×3): via INTRAVENOUS

## 2011-11-10 MED ORDER — SODIUM CHLORIDE 0.9 % IV SOLN
1.5000 g | Freq: Once | INTRAVENOUS | Status: AC
  Administered 2011-11-11: 1.5 g via INTRAVENOUS
  Filled 2011-11-10: qty 1.5

## 2011-11-10 MED ORDER — SODIUM CHLORIDE 0.9 % IV BOLUS (SEPSIS)
1000.0000 mL | Freq: Once | INTRAVENOUS | Status: AC
  Administered 2011-11-10: 1000 mL via INTRAVENOUS

## 2011-11-10 MED ORDER — ONDANSETRON HCL 4 MG/2ML IJ SOLN
4.0000 mg | Freq: Once | INTRAMUSCULAR | Status: AC
  Administered 2011-11-10: 4 mg via INTRAVENOUS
  Filled 2011-11-10: qty 2

## 2011-11-10 MED ORDER — ONDANSETRON HCL 4 MG/2ML IJ SOLN: 4.0000 mg | Freq: Four times a day (QID) | INTRAMUSCULAR | Status: DC | PRN

## 2011-11-10 NOTE — ED Provider Notes (Signed)
History     CSN: [id number] Arrival date & time: 11/10/2011  6:45 PM   First MD Initiated Contact with Patient 11/10/11 1933      Chief Complaint  Patient presents with  . Emesis    (Consider location/radiation/quality/duration/timing/severity/associated sxs/prior treatment) Patient is a 20 y.o. female presenting with vomiting. The history is provided by the patient.  Emesis  Pertinent negatives include no abdominal pain, no chills, no cough, no diarrhea, no fever and no myalgias.   Patient was seen here in the ED yesterday for R flank pain. Her urinalysis was positive for both nitrites and leukocytes. A CT was also obtained which showed renal calculi, but no evidence of calculi in the ureters. Pyelonephritis was suspected, and she was given one dose of IV antibiotics in the department. She was rehydrated, and tolerated a by mouth trial prior to discharge. She states that she has been unable to tolerate anything by mouth today, and has been vomiting. She has been unable to keep down her anti-emetic medication or antibiotic. She has continued pain in her right flank. Denies vaginal discharge or change in bowel movements. She has been afebrile at home.  Pt's PCP is Harlan Stains with [facility].  Past Medical History  Diagnosis Date  . UTI (lower urinary tract infection)   . Anemia     Past Surgical History  Procedure Date  . Appendectomy     No family history on file.  History  Substance Use Topics  . Smoking status: Never Smoker   . Smokeless tobacco: Never Used  . Alcohol Use: Yes     occa    OB History    Grav Para Term Preterm Abortions TAB SAB Ect Mult Living                  Review of Systems  Constitutional: Positive for appetite change. Negative for fever, chills, activity change and fatigue.  Respiratory: Negative for cough and shortness of breath.   Gastrointestinal: Positive for nausea and vomiting. Negative for abdominal pain and diarrhea.  Genitourinary:  Positive for dysuria, frequency and flank pain. Negative for decreased urine volume, vaginal bleeding, vaginal discharge and difficulty urinating.  Musculoskeletal: Negative for myalgias.  Skin: Negative for color change and rash.  Neurological: Negative for dizziness and weakness.    Allergies  Review of patient's allergies indicates no known allergies.  Home Medications   Current Outpatient Rx  Name Route Sig Dispense Refill  . CIPROFLOXACIN HCL 500 MG PO TABS Oral Take 1 tablet (500 mg total) by mouth 2 (two) times daily. 14 tablet 0  . FERROUS SULFATE 325 (65 FE) MG PO TABS Oral Take 325 mg by mouth daily.      . IBUPROFEN 200 MG PO TABS Oral Take 400 mg by mouth every 8 (eight) hours as needed. For pain.     . OXYCODONE-ACETAMINOPHEN 5-325 MG PO TABS Oral Take 1 tablet by mouth every 4 (four) hours as needed for pain. 30 tablet 0    BP 100/56  Pulse 103  Temp(Src) 98.4 F (36.9 C) (Oral)  Resp 20  Wt 120 lb (54.432 kg)  SpO2 100%  LMP 10/16/2011  Physical Exam  Nursing note and vitals reviewed. Constitutional: She is oriented to person, place, and time. She appears well-developed and well-nourished. No distress.       Uncomfortable appearing  HENT:  Head: Normocephalic and atraumatic.  Eyes: Conjunctivae are normal. Pupils are equal, round, and reactive to light.  Neck: Normal  range of motion.  Cardiovascular: Regular rhythm and normal heart sounds.  Tachycardia present.   Pulmonary/Chest: Effort normal and breath sounds normal. No respiratory distress. She has no wheezes.  Abdominal: Soft. Bowel sounds are normal. There is tenderness in the right lower quadrant. There is CVA tenderness. There is no rebound and no guarding.  Musculoskeletal: Normal range of motion.  Neurological: She is alert and oriented to person, place, and time.  Skin: Skin is warm and dry. No rash noted. She is not diaphoretic.  Psychiatric: She has a normal mood and affect.    ED Course    Procedures (including critical care time)  Labs Reviewed - No data to display Ct Abdomen Pelvis Wo Contrast  11/09/2011  *RADIOLOGY REPORT*  Clinical Data: Right flank pain, nausea, no history of stones, prior appendectomy  CT ABDOMEN AND PELVIS WITHOUT CONTRAST  Technique:  Multidetector CT imaging of the abdomen and pelvis was performed following the standard protocol without intravenous contrast.  Comparison: [place] CT abdomen pelvis dated 12/22/2007  Findings: Lung bases are clear.  Unenhanced liver, spleen, pancreas, and adrenal glands within normal limits.  Gallbladder is unremarkable.  No intrahepatic or extrahepatic ductal dilatation.  At least three nonobstructing right renal calculi measuring up to 3 mm in the right lower pole (series 2/image 40).  At least four nonobstructing left renal calculi, measuring up to 4 mm in the left upper pole (series 2/image 31).  Right extrarenal pelvis.  No hydronephrosis.  No evidence of bowel obstruction.  Status post appendectomy.  No evidence of abdominal aortic aneurysm.  No abdominopelvic ascites.  No suspicious abdominopelvic lymphadenopathy.  Uterus and bilateral ovaries are unremarkable.  No ureteral or bladder calculi.  Visualized osseous structures are within normal limits.  IMPRESSION: Tiny bilateral nonobstructing calculi, measuring up to 3 mm in the right lower pole and 4 mm in the left upper pole.  No hydronephrosis.  No ureteral or bladder calculi.  Original Report Authenticated By: Julian Hy, M.D.     1. Pyelonephritis, acute       MDM  8:00 PM Patient assessed. Previous notes reviewed. She was seen here yesterday and treated for suspected pyelonephritis. She tolerated a PO trial prior to discharge yesterday. She states that she has been unable to keep anything down including her antibiotics and anti-emetics today. Plan to rehydrate, medicate with anti-emetics, and repeat CBC. It is possible that she may require admission for  rehydration, control of nausea and vomiting, and IV antibiotics. Will continue to monitor.  9:51 PM Patient states pain is slightly improved; however, she continues to feel nauseated. WBC increased from 15 yesterday to 19 today. Given this information, will admit the patient. Consult to medicine placed. Discussed with Dr. Marin Comment; will give Unasyn here and admit to gen med.       Abran Richard, [place] 11/10/11 2214

## 2011-11-10 NOTE — ED Notes (Signed)
Pt states "seen here yesterday, was given medicine for vomiting but can't keep anything down, I have kidney stones"

## 2011-11-10 NOTE — ED Provider Notes (Signed)
Medical screening examination/treatment/procedure(s) were performed by non-physician practitioner and as supervising physician I was immediately available for consultation/collaboration.  Kalman Drape, MD 11/10/11 2219

## 2011-11-10 NOTE — ED Notes (Signed)
Attempted to call report to [facility] but nurse was busy. Nurse will call back.

## 2011-11-10 NOTE — ED Notes (Signed)
Gave pt water for oral trial and pt was unable to keep water down.

## 2011-11-10 NOTE — ED Provider Notes (Signed)
Medical screening examination/treatment/procedure(s) were performed by non-physician practitioner and as supervising physician I was immediately available for consultation/collaboration.   Lezlie Octave, MD 11/10/11 [phone]

## 2011-11-11 ENCOUNTER — Other Ambulatory Visit: Payer: Self-pay

## 2011-11-11 ENCOUNTER — Encounter (HOSPITAL_COMMUNITY): Payer: Self-pay | Admitting: *Deleted

## 2011-11-11 DIAGNOSIS — I059 Rheumatic mitral valve disease, unspecified: Secondary | ICD-10-CM

## 2011-11-11 HISTORY — PX: TRANSTHORACIC ECHOCARDIOGRAM: SHX275

## 2011-11-11 LAB — CBC
HCT: 31.6 % — ABNORMAL LOW (ref 36.0–46.0)
Hemoglobin: 10.1 g/dL — ABNORMAL LOW (ref 12.0–15.0)
MCH: 25.4 pg — ABNORMAL LOW (ref 26.0–34.0)
MCV: 79.4 fL (ref 78.0–100.0)
RBC: 3.98 MIL/uL (ref 3.87–5.11)
WBC: 12.6 10*3/uL — ABNORMAL HIGH (ref 4.0–10.5)

## 2011-11-11 LAB — BASIC METABOLIC PANEL
CO2: 22 mEq/L (ref 19–32)
Calcium: 8.6 mg/dL (ref 8.4–10.5)
Chloride: 108 mEq/L (ref 96–112)
Glucose, Bld: 146 mg/dL — ABNORMAL HIGH (ref 70–99)
Sodium: 139 mEq/L (ref 135–145)

## 2011-11-11 MED ORDER — SODIUM CHLORIDE 0.9 % IV SOLN
1.5000 g | Freq: Four times a day (QID) | INTRAVENOUS | Status: DC
  Administered 2011-11-11 – 2011-11-12 (×5): 1.5 g via INTRAVENOUS
  Filled 2011-11-11 (×7): qty 1.5

## 2011-11-11 MED ORDER — FERROUS SULFATE 325 (65 FE) MG PO TABS
325.0000 mg | ORAL_TABLET | Freq: Every day | ORAL | Status: DC
  Administered 2011-11-11 – 2011-11-14 (×4): 325 mg via ORAL
  Filled 2011-11-11 (×4): qty 1

## 2011-11-11 MED ORDER — POTASSIUM CHLORIDE CRYS ER 20 MEQ PO TBCR
40.0000 meq | EXTENDED_RELEASE_TABLET | ORAL | Status: AC
  Administered 2011-11-11 (×2): 40 meq via ORAL
  Filled 2011-11-11 (×2): qty 2

## 2011-11-11 NOTE — Progress Notes (Addendum)
Subjective: Patient seen and examined ,feeling better but still nauseated   Objective: Vital signs in last 24 hours: Temp:  [98.4 F (36.9 C)-100.2 F (37.9 C)] 99.7 F (37.6 C) (12/15 1049) Pulse Rate:  [103-119] 105  (12/15 1049) Resp:  [16-20] 18  (12/15 1049) BP: (94-107)/(50-74) 107/74 mmHg (12/15 1049) SpO2:  [98 %-100 %] 100 % (12/15 1049) Weight:  [54.432 kg (120 lb)-54.658 kg (120 lb 8 oz)] 120 lb 8 oz (54.658 kg) (12/14 2308) Weight change:  Last BM Date: 11/10/11  Intake/Output from previous day: 12/14 0701 - 12/15 0700 In: 980 [P.O.:180; I.V.:750; IV Piggyback:50] Out: 575 [Urine:575]     Physical Exam: General: Alert, awake, oriented x3, in no acute distress. Heart: Regular rate and rhythm, without murmurs, rubs, gallops. Lungs: Clear to auscultation bilaterally. Abdomen: Soft, nontender, nondistended, positive bowel sounds.No CVA tenderness Extremities: No clubbing cyanosis or edema with positive pedal pulses. Neuro: Grossly intact, nonfocal.    Lab Results: Results for orders placed during the hospital encounter of 11/10/11 (from the past 24 hour(s))  CBC     Status: Abnormal   Collection Time   11/10/11  8:30 PM      Component Value Range   WBC 19.0 (*) 4.0 - 10.5 (K/uL)   RBC 4.60  3.87 - 5.11 (MIL/uL)   Hemoglobin 12.0  12.0 - 15.0 (g/dL)   HCT 36.6  36.0 - 46.0 (%)   MCV 79.6  78.0 - 100.0 (fL)   MCH 26.1  26.0 - 34.0 (pg)   MCHC 32.8  30.0 - 36.0 (g/dL)   RDW 15.0  11.5 - 15.5 (%)   Platelets 234  150 - 400 (K/uL)  DIFFERENTIAL     Status: Abnormal   Collection Time   11/10/11  8:30 PM      Component Value Range   Neutrophils Relative 83 (*) 43 - 77 (%)   Neutro Abs 15.7 (*) 1.7 - 7.7 (K/uL)   Lymphocytes Relative 5 (*) 12 - 46 (%)   Lymphs Abs 1.0  0.7 - 4.0 (K/uL)   Monocytes Relative 12  3 - 12 (%)   Monocytes Absolute 2.3 (*) 0.1 - 1.0 (K/uL)   Eosinophils Relative 0  0 - 5 (%)   Eosinophils Absolute 0.0  0.0 - 0.7 (K/uL)   Basophils Relative 0  0 - 1 (%)   Basophils Absolute 0.0  0.0 - 0.1 (K/uL)  POCT I-STAT, CHEM 8     Status: Normal   Collection Time   11/10/11  9:39 PM      Component Value Range   Sodium 141  135 - 145 (mEq/L)   Potassium 3.6  3.5 - 5.1 (mEq/L)   Chloride 108  96 - 112 (mEq/L)   BUN 12  6 - 23 (mg/dL)   Creatinine, Ser 1.00  0.50 - 1.10 (mg/dL)   Glucose, Bld 91  70 - 99 (mg/dL)   Calcium, Ion 1.19  1.12 - 1.32 (mmol/L)   TCO2 23  0 - 100 (mmol/L)   Hemoglobin 13.3  12.0 - 15.0 (g/dL)   HCT 39.0  36.0 - 46.0 (%)  MRSA PCR SCREENING     Status: Normal   Collection Time   11/11/11  3:36 AM      Component Value Range   MRSA by PCR NEGATIVE  NEGATIVE   BASIC METABOLIC PANEL     Status: Abnormal   Collection Time   11/11/11  3:50 AM      Component Value  Range   Sodium 139  135 - 145 (mEq/L)   Potassium 3.3 (*) 3.5 - 5.1 (mEq/L)   Chloride 108  96 - 112 (mEq/L)   CO2 22  19 - 32 (mEq/L)   Glucose, Bld 146 (*) 70 - 99 (mg/dL)   BUN 11  6 - 23 (mg/dL)   Creatinine, Ser 0.88  0.50 - 1.10 (mg/dL)   Calcium 8.6  8.4 - 10.5 (mg/dL)   GFR calc non Af Amer >90  >90 (mL/min)   GFR calc Af Amer >90  >90 (mL/min)  CBC     Status: Abnormal   Collection Time   11/11/11  3:50 AM      Component Value Range   WBC 12.6 (*) 4.0 - 10.5 (K/uL)   RBC 3.98  3.87 - 5.11 (MIL/uL)   Hemoglobin 10.1 (*) 12.0 - 15.0 (g/dL)   HCT 31.6 (*) 36.0 - 46.0 (%)   MCV 79.4  78.0 - 100.0 (fL)   MCH 25.4 (*) 26.0 - 34.0 (pg)   MCHC 32.0  30.0 - 36.0 (g/dL)   RDW 15.1  11.5 - 15.5 (%)   Platelets 191  150 - 400 (K/uL)    Studies/Results: No results found.  Medications:    . sodium chloride   Intravenous Once  . ampicillin-sulbactam (UNASYN) IV  1.5 g Intravenous Once  . ampicillin-sulbactam (UNASYN) IV  1.5 g Intravenous Q6H  . ferrous sulfate  325 mg Oral Daily  .  morphine injection  4 mg Intravenous Once  . ondansetron (ZOFRAN) IV  4 mg Intravenous Once  . sodium chloride  1,000 mL Intravenous  Once  . DISCONTD:  morphine injection  4 mg Intravenous Once    HYDROcodone-acetaminophen, HYDROmorphone, ondansetron (ZOFRAN) IV, ondansetron     . dextrose 5 % and 0.9% NaCl 125 mL/hr at 11/11/11 [id number]    Assessment/Plan:  Active Problems:  Nephrolithiasis  Nausea & vomiting UTI Plan: Continue antibiotics ,IVF and antiemetics  Follow urine culture results Replete k    LOS: 1 day   Myer Bohlman 11/11/2011, 11:54 AM

## 2011-11-11 NOTE — Progress Notes (Signed)
  Echocardiogram 2D Echocardiogram has been performed.  Cindy [name] 11/11/2011, 10:46 AM

## 2011-11-11 NOTE — H&P (Signed)
PCP: Harlan Stains, M.D.   Chief Complaint: Nausea and vomiting, pyelonephritis   HPI: Cindy [name] is an 20 y.o. female with history of iron deficiency anemia, prior UTI, recently diagnosed with pyelonephritis and nonobstructing nephrolithiasis, treated with Cipro, presents back to the emergency room for persistent nausea and vomiting. She has not been able to take her medications. She has mild back pain but denied fever or chills, shortness of breath or chest pain. She has no dysuria. She is aware that she has a heart murmur, but does not believe she ever had an echo.  Rewiew of Systems:  The patient denies , fever, weight loss,, vision loss, decreased hearing, hoarseness, chest pain, syncope, dyspnea on exertion, peripheral edema, balance deficits, hemoptysis, abdominal pain, melena, hematochezia, severe indigestion/heartburn, hematuria, incontinence, genital sores, muscle weakness, suspicious skin lesions, transient blindness, difficulty walking, depression, unusual weight change, abnormal bleeding, enlarged lymph nodes, angioedema, and breast masses.  Past Medical History  Diagnosis Date  . UTI (lower urinary tract infection)   . Anemia    heart murmur  Past Surgical History  Procedure Date  . Appendectomy     Medications:  HOME MEDS: Prior to Admission medications   Medication Sig Start Date End Date Taking? Authorizing Provider  ciprofloxacin (CIPRO) 500 MG tablet Take 1 tablet (500 mg total) by mouth 2 (two) times daily. 11/09/11 11/19/11 Yes Lisette Paz, PA  ferrous sulfate 325 (65 FE) MG tablet Take 325 mg by mouth daily.     Yes Historical Provider, MD  ibuprofen (ADVIL,MOTRIN) 200 MG tablet Take 400 mg by mouth every 8 (eight) hours as needed. For pain.    Yes Historical Provider, MD  oxyCODONE-acetaminophen (PERCOCET) 5-325 MG per tablet Take 1 tablet by mouth every 4 (four) hours as needed for pain. 11/09/11 11/19/11 Yes Allgood, PA     Allergies:  No Known  Allergies  Social History:   reports that she has never smoked. She has never used smokeless tobacco. She reports that she drinks alcohol. She reports that she does not use illicit drugs.  Family History: No family history on file.   Physical Exam: Filed Vitals:   11/10/11 1851 11/10/11 2308  BP: 100/56 104/50  Pulse: 103 114  Temp: 98.4 F (36.9 C) 98.6 F (37 C)  TempSrc: Oral Oral  Resp: 20 16  Height:  5\' 1"  (1.549 m)  Weight: 54.432 kg (120 lb) 54.658 kg (120 lb 8 oz)  SpO2: 100% 100%   Blood pressure 104/50, pulse 114, temperature 98.6 F (37 C), temperature source Oral, resp. rate 16, height 5\' 1"  (1.549 m), weight 54.658 kg (120 lb 8 oz), last menstrual period 10/16/2011, SpO2 100.00%.  GEN:  Pleasant person lying in the stretcher in no acute distress; cooperative with exam PSYCH:  alert and oriented x4; does not appear anxious does not appear depressed; affect is normal HEENT: Mucous membranes pink and anicteric; PERRLA; EOM intact; no cervical lymphadenopathy nor thyromegaly or carotid bruit; no JVD; Breasts:: Not examined CHEST WALL: No tenderness CHEST: Normal respiration, clear to auscultation bilaterally HEART: Regular rate and rhythm; there is a 2-3 systolic ejection murmur at the right sternal border.  BACK: No kyphosis or scoliosis; no CVA tenderness ABDOMEN: Obese, soft non-tender; no masses, no organomegaly, normal abdominal bowel sounds; no pannus; no intertriginous candida. Rectal Exam: Not done EXTREMITIES: No bone or joint deformity; age-appropriate arthropathy of the hands and knees; no edema; no ulcerations. Genitalia: not examined PULSES: 2+ and symmetric SKIN: Normal hydration  no rash or ulceration CNS: Cranial nerves 2-12 grossly intact no focal neurologic deficit   Labs & Imaging Results for orders placed during the hospital encounter of 11/10/11 (from the past 48 hour(s))  CBC     Status: Abnormal   Collection Time   11/10/11  8:30 PM       Component Value Range Comment   WBC 19.0 (*) 4.0 - 10.5 (K/uL)    RBC 4.60  3.87 - 5.11 (MIL/uL)    Hemoglobin 12.0  12.0 - 15.0 (g/dL)    HCT 36.6  36.0 - 46.0 (%)    MCV 79.6  78.0 - 100.0 (fL)    MCH 26.1  26.0 - 34.0 (pg)    MCHC 32.8  30.0 - 36.0 (g/dL)    RDW 15.0  11.5 - 15.5 (%)    Platelets 234  150 - 400 (K/uL)   DIFFERENTIAL     Status: Abnormal   Collection Time   11/10/11  8:30 PM      Component Value Range Comment   Neutrophils Relative 83 (*) 43 - 77 (%)    Neutro Abs 15.7 (*) 1.7 - 7.7 (K/uL)    Lymphocytes Relative 5 (*) 12 - 46 (%)    Lymphs Abs 1.0  0.7 - 4.0 (K/uL)    Monocytes Relative 12  3 - 12 (%)    Monocytes Absolute 2.3 (*) 0.1 - 1.0 (K/uL)    Eosinophils Relative 0  0 - 5 (%)    Eosinophils Absolute 0.0  0.0 - 0.7 (K/uL)    Basophils Relative 0  0 - 1 (%)    Basophils Absolute 0.0  0.0 - 0.1 (K/uL)   POCT I-STAT, CHEM 8     Status: Normal   Collection Time   11/10/11  9:39 PM      Component Value Range Comment   Sodium 141  135 - 145 (mEq/L)    Potassium 3.6  3.5 - 5.1 (mEq/L)    Chloride 108  96 - 112 (mEq/L)    BUN 12  6 - 23 (mg/dL)    Creatinine, Ser 1.00  0.50 - 1.10 (mg/dL)    Glucose, Bld 91  70 - 99 (mg/dL)    Calcium, Ion 1.19  1.12 - 1.32 (mmol/L)    TCO2 23  0 - 100 (mmol/L)    Hemoglobin 13.3  12.0 - 15.0 (g/dL)    HCT 39.0  36.0 - 46.0 (%)    Ct Abdomen Pelvis Wo Contrast  11/09/2011  *RADIOLOGY REPORT*  Clinical Data: Right flank pain, nausea, no history of stones, prior appendectomy  CT ABDOMEN AND PELVIS WITHOUT CONTRAST  Technique:  Multidetector CT imaging of the abdomen and pelvis was performed following the standard protocol without intravenous contrast.  Comparison: [place] CT abdomen pelvis dated 12/22/2007  Findings: Lung bases are clear.  Unenhanced liver, spleen, pancreas, and adrenal glands within normal limits.  Gallbladder is unremarkable.  No intrahepatic or extrahepatic ductal dilatation.  At least three  nonobstructing right renal calculi measuring up to 3 mm in the right lower pole (series 2/image 40).  At least four nonobstructing left renal calculi, measuring up to 4 mm in the left upper pole (series 2/image 31).  Right extrarenal pelvis.  No hydronephrosis.  No evidence of bowel obstruction.  Status post appendectomy.  No evidence of abdominal aortic aneurysm.  No abdominopelvic ascites.  No suspicious abdominopelvic lymphadenopathy.  Uterus and bilateral ovaries are unremarkable.  No ureteral or bladder calculi.  Visualized osseous structures are within normal limits.  IMPRESSION: Tiny bilateral nonobstructing calculi, measuring up to 3 mm in the right lower pole and 4 mm in the left upper pole.  No hydronephrosis.  No ureteral or bladder calculi.  Original Report Authenticated By: Julian Hy, M.D.      Assessment Present on Admission:  .Nephrolithiasis .Nausea & vomiting Heart murmur Pyelonephritis  PLAN: Will admit her to general medical floor. I will switch her from the quinolone to Unasyn to have better enterococcus coverage. She will be given antiemetic, analgesic, and IV fluids. She will be given antipyretic as well. She is stable, full code, and will be admitted to [facility] hospitalist. Her nephrolithiasis nonobstructing and will continue to follow. For heart murmur, we'll obtain a cardiac echo.  Other plans as per orders.    Tasnia Spegal 11/11/2011, 12:15 AM

## 2011-11-12 ENCOUNTER — Inpatient Hospital Stay (HOSPITAL_COMMUNITY): Payer: Managed Care, Other (non HMO)

## 2011-11-12 MED ORDER — DEXTROSE 5 % IV SOLN
1.0000 g | Freq: Two times a day (BID) | INTRAVENOUS | Status: DC
  Administered 2011-11-12 – 2011-11-14 (×4): 1 g via INTRAVENOUS
  Filled 2011-11-12 (×6): qty 1

## 2011-11-12 MED ORDER — CIPROFLOXACIN HCL 500 MG PO TABS
500.0000 mg | ORAL_TABLET | Freq: Two times a day (BID) | ORAL | Status: DC
  Administered 2011-11-12: 500 mg via ORAL
  Filled 2011-11-12 (×4): qty 1

## 2011-11-12 MED ORDER — LEVOFLOXACIN IN D5W 500 MG/100ML IV SOLN
500.0000 mg | INTRAVENOUS | Status: DC
  Administered 2011-11-12 – 2011-11-13 (×2): 500 mg via INTRAVENOUS
  Filled 2011-11-12 (×3): qty 100

## 2011-11-12 MED ORDER — SODIUM CHLORIDE 0.9 % IV SOLN
750.0000 mg | Freq: Three times a day (TID) | INTRAVENOUS | Status: DC
  Administered 2011-11-12 – 2011-11-14 (×5): 750 mg via INTRAVENOUS
  Filled 2011-11-12 (×9): qty 750

## 2011-11-12 MED ORDER — GUAIFENESIN 100 MG/5ML PO SOLN
5.0000 mL | ORAL | Status: DC | PRN
  Administered 2011-11-12 (×2): 100 mg via ORAL
  Filled 2011-11-12 (×2): qty 10

## 2011-11-12 NOTE — Progress Notes (Signed)
Subjective:  Patient seen and examined, complaining of cough which he described as dry denies any shortness of breath and  Objective: Vital signs in last 24 hours: Temp:  [98.1 F (36.7 C)-99.9 F (37.7 C)] 98.8 F (37.1 C) (12/16 0600) Pulse Rate:  [101-124] 105  (12/16 0600) Resp:  [18] 18  (12/16 0600) BP: (98-124)/(60-82) 105/72 mmHg (12/16 0600) SpO2:  [95 %-100 %] 95 % (12/16 0600) Weight change:  Last BM Date: 11/11/11  Intake/Output from previous day: 12/15 0701 - 12/16 0700 In: 3780.4 [P.O.:720; I.V.:3060.4] Out: 2425 [Urine:2425]     Physical Exam: General: Alert, awake, oriented x3, in no acute distress.  Heart: Regular rate and rhythm, without murmurs, rubs, gallops.  Lungs: Clear to auscultation bilaterally.  Abdomen: Soft, nontender, nondistended, positive bowel sounds.No CVA tenderness  Extremities: No clubbing cyanosis or edema with positive pedal pulses.  Neuro: Grossly intact, nonfocal.    Lab Results: No results found for this or any previous visit (from the past 24 hour(s)).  Studies/Results: No results found.  Medications:    . ciprofloxacin  500 mg Oral BID  . ferrous sulfate  325 mg Oral Daily  . potassium chloride  40 mEq Oral Q4H  . DISCONTD: ampicillin-sulbactam (UNASYN) IV  1.5 g Intravenous Q6H    HYDROcodone-acetaminophen, HYDROmorphone, ondansetron (ZOFRAN) IV, ondansetron     . 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 Plan:  She is improving, we'll change IV unaysn to by mouth ciprofloxacin . Unfortunately I was unable to locate the urine culture, I will order for urine culture even though the yield would be low since she is already on antibiotics. Will do chest x-ray and Robitussin when necessary for cough.       LOS: 2 days   Ronnell Clinger 11/12/2011, 10:19 AM

## 2011-11-12 NOTE — Progress Notes (Signed)
ANTIBIOTIC CONSULT NOTE - INITIAL  Pharmacy Consult for Cefepime and Vancomycin Indication: Suspected PNA   No Known Allergies  Patient Measurements: Height: 5\' 1"  (154.9 cm) Weight: 120 lb 8 oz (54.658 kg) IBW/kg (Calculated) : 47.8   Vital Signs: Temp: 99.2 F (37.3 C) (12/16 1750) Temp src: Oral (12/16 1750) BP: 109/64 mmHg (12/16 1750) Pulse Rate: 126  (12/16 1750) Intake/Output from previous day: 12/15 0701 - 12/16 0700 In: 3780.4 [P.O.:720; I.V.:3060.4] Out: 2425 [Urine:2425] Intake/Output from this shift: Total I/O In: 720 [P.O.:720] Out: 1300 [Urine:1300]  Labs:  [facility] 11/11/11 0350 11/10/11 2139 11/10/11 2030  WBC 12.6* -- 19.0*  HGB 10.1* 13.3 12.0  PLT 191 -- 234  LABCREA -- -- --  CREATININE 0.88 1.00 --   Estimated Creatinine Clearance: 75.7 ml/min (by C-G formula based on Cr of 0.88). No results found for this basename: VANCOTROUGH:2,VANCOPEAK:2,VANCORANDOM:2,GENTTROUGH:2,GENTPEAK:2,GENTRANDOM:2,TOBRATROUGH:2,TOBRAPEAK:2,TOBRARND:2,AMIKACINPEAK:2,AMIKACINTROU:2,AMIKACIN:2, in the last 72 hours   Microbiology: Recent Results (from the past 720 hour(s))  MRSA PCR SCREENING     Status: Normal   Collection Time   11/11/11  3:36 AM      Component Value Range Status Comment   MRSA by PCR NEGATIVE  NEGATIVE  Final     Medical History: Past Medical History  Diagnosis Date  . UTI (lower urinary tract infection)   . Anemia     Medications:  Scheduled:    . ferrous sulfate  325 mg Oral Daily  . levofloxacin (LEVAQUIN) IV  500 mg Intravenous Q24H  . DISCONTD: ampicillin-sulbactam (UNASYN) IV  1.5 g Intravenous Q6H  . DISCONTD: ciprofloxacin  500 mg Oral BID   Infusions:    . DISCONTD: dextrose 5 % and 0.9% NaCl 125 mL/hr at 11/12/11 0350   Assessment:  20 YOF to start Cefepime and Vancomycin per pharmacy for suspected PNA.  MD also ordered Levaquin 500 mg daily, ok if for CAP.    Normalized CrCl 111 ml/min, Tmax 99.9, leukocytosis  Goal  of Therapy:  Vancomycin trough level 15-20 mcg/ml  Plan:   Vancomycin 750 mg IV q8 hours, first dose now   Cefepime 1gm IV q12h, first dose now.  Follow renal fxn and trough at steady state if indicated.   Vanessa [place] Thi 11/12/2011,6:20 PM

## 2011-11-13 LAB — BASIC METABOLIC PANEL
Calcium: 9.1 mg/dL (ref 8.4–10.5)
GFR calc non Af Amer: >90 mL/min (ref >90)
Sodium: 138 mEq/L (ref 135–145)

## 2011-11-13 LAB — CBC
MCH: 25.1 pg — ABNORMAL LOW (ref 26.0–34.0)
MCHC: 32.2 g/dL (ref 30.0–36.0)
Platelets: 253 10*3/uL (ref 150–400)
RBC: 4.38 MIL/uL (ref 3.87–5.11)

## 2011-11-13 MED ORDER — POTASSIUM CHLORIDE CRYS ER 20 MEQ PO TBCR
40.0000 meq | EXTENDED_RELEASE_TABLET | ORAL | Status: AC
  Administered 2011-11-13 (×2): 40 meq via ORAL
  Filled 2011-11-13 (×2): qty 2

## 2011-11-13 NOTE — Progress Notes (Signed)
UR completed 

## 2011-11-13 NOTE — Progress Notes (Signed)
Subjective:  Patient seen and examined, feeling better today. Still coughing but no fever or chills.  Objective: Vital signs in last 24 hours: Temp:  [98.8 F (37.1 C)-99.3 F (37.4 C)] 98.8 F (37.1 C) (12/17 1343) Pulse Rate:  [100-126] 112  (12/17 1343) Resp:  [18] 18  (12/17 1343) BP: (107-137)/(64-85) 137/85 mmHg (12/17 1343) SpO2:  [85 %-95 %] 95 % (12/17 1343) Weight change:  Last BM Date: 11/11/11  Intake/Output from previous day: 12/16 0701 - 12/17 0700 In: 720 [P.O.:720] Out: 2700 [Urine:2700] Total I/O In: 680 [P.O.:480; IV Piggyback:200] Out: 1500 [Urine:1500]   Physical Exam: General: Alert, awake, oriented x3, in no acute distress. Heart: Regular rate and rhythm, without murmurs, rubs, gallops. Lungs: Clear to auscultation bilaterally. Abdomen: Soft, nontender, nondistended, positive bowel sounds. Extremities: No clubbing cyanosis or edema with positive pedal pulses. Neuro: Grossly intact, nonfocal.    Lab Results: Results for orders placed during the hospital encounter of 11/10/11 (from the past 24 hour(s))  CBC     Status: Abnormal   Collection Time   11/13/11  3:50 AM      Component Value Range   WBC 9.2  4.0 - 10.5 (K/uL)   RBC 4.38  3.87 - 5.11 (MIL/uL)   Hemoglobin 11.0 (*) 12.0 - 15.0 (g/dL)   HCT 34.2 (*) 36.0 - 46.0 (%)   MCV 78.1  78.0 - 100.0 (fL)   MCH 25.1 (*) 26.0 - 34.0 (pg)   MCHC 32.2  30.0 - 36.0 (g/dL)   RDW 15.7 (*) 11.5 - 15.5 (%)   Platelets 253  150 - 400 (K/uL)  BASIC METABOLIC PANEL     Status: Abnormal   Collection Time   11/13/11  3:50 AM      Component Value Range   Sodium 138  135 - 145 (mEq/L)   Potassium 3.4 (*) 3.5 - 5.1 (mEq/L)   Chloride 108  96 - 112 (mEq/L)   CO2 22  19 - 32 (mEq/L)   Glucose, Bld 98  70 - 99 (mg/dL)   BUN 4 (*) 6 - 23 (mg/dL)   Creatinine, Ser 0.80  0.50 - 1.10 (mg/dL)   Calcium 9.1  8.4 - 10.5 (mg/dL)   GFR calc non Af Amer >90  >90 (mL/min)   GFR calc Af Amer >90  >90 (mL/min)     Studies/Results: Dg Chest 2vsame Day  11/12/2011  *RADIOLOGY REPORT*  Clinical Data: Cough and fever  CHEST - 2 VIEW SAME DAY  Comparison: 12/26/2007  Findings: The heart size appears normal.  There is airspace consolidation involving much of the left lower lobe.   The right lung appears clear.  The visualized osseous structures are unremarkable.  IMPRESSION:  1.  Left lower lobe pneumonia.  Original Report Authenticated By: Angelita Ingles, M.D.    Medications:    . ceFEPime (MAXIPIME) IV  1 g Intravenous Q12H  . ferrous sulfate  325 mg Oral Daily  . levofloxacin (LEVAQUIN) IV  500 mg Intravenous Q24H  . potassium chloride  40 mEq Oral Q4H  . vancomycin  750 mg Intravenous Q8H  . DISCONTD: ciprofloxacin  500 mg Oral BID    guaiFENesin, HYDROcodone-acetaminophen, HYDROmorphone, ondansetron (ZOFRAN) IV, ondansetron     Assessment/Plan: Active Problems:  Nephrolithiasis  Nausea & vomiting  UTI  Hospital-acquired pneumonia Plan:  Continue  IV antibiotics for one more day then switched to by mouth tomorrow.titrate O2 down as tolerated. Follow urine culture results  Replete k Possible discharge to  home in the a.m.    LOS: 3 days   Cindy [name] 11/13/2011, 4:56 PM

## 2011-11-14 DIAGNOSIS — N12 Tubulo-interstitial nephritis, not specified as acute or chronic: Secondary | ICD-10-CM | POA: Diagnosis present

## 2011-11-14 DIAGNOSIS — J189 Pneumonia, unspecified organism: Secondary | ICD-10-CM | POA: Clinically undetermined

## 2011-11-14 LAB — URINE CULTURE
Colony Count: NO GROWTH
Culture: NO GROWTH

## 2011-11-14 LAB — POTASSIUM: Potassium: 3.9 meq/L (ref 3.5–5.1)

## 2011-11-14 MED ORDER — GUAIFENESIN 100 MG/5ML PO SOLN: 5.0000 mL | ORAL | Status: DC | PRN

## 2011-11-14 MED ORDER — LEVOFLOXACIN 500 MG PO TABS: 500.0000 mg | ORAL_TABLET | Freq: Every day | ORAL | Status: AC

## 2011-11-14 NOTE — Discharge Summary (Signed)
Patient ID: Cindy [name] MRN: [id number] DOB/AGE: 22-28-1992 56 y.o.  Admit date: 11/10/2011 Discharge date: 11/14/2011  Primary Care Physician:  No primary provider on file.   Discharge Diagnoses:    Present on Admission:  *Pyelonephritis *Pneumonia .Nephrolithiasis .Nausea & vomiting   Current Discharge Medication List    START taking these medications   Details  guaiFENesin (ROBITUSSIN) 100 MG/5ML SOLN Take 5 mLs (100 mg total) by mouth every 4 (four) hours as needed. Qty: 120 mL, Refills: 0    levofloxacin (LEVAQUIN) 500 MG tablet Take 1 tablet (500 mg total) by mouth daily. Qty: 8 tablet, Refills: 0      CONTINUE these medications which have NOT CHANGED   Details  ferrous sulfate 325 (65 FE) MG tablet Take 325 mg by mouth daily.     ibuprofen (ADVIL,MOTRIN) 200 MG tablet Take 400 mg by mouth every 8 (eight) hours as needed. For pain.    oxyCODONE-acetaminophen (PERCOCET) 5-325 MG per tablet Take 1 tablet by mouth every 4 (four) hours as needed for pain. Qty: 30 tablet, Refills: 0      STOP taking these medications     ciprofloxacin (CIPRO) 500 MG tablet          Consults:   None   Significant Diagnostic Studies:  Ct Abdomen Pelvis Wo Contrast  11/09/2011  *RADIOLOGY REPORT*  Clinical Data: Right flank pain, nausea, no history of stones, prior appendectomy  CT ABDOMEN AND PELVIS WITHOUT CONTRAST  Technique:  Multidetector CT imaging of the abdomen and pelvis was performed following the standard protocol without intravenous contrast.  Comparison: [place] CT abdomen pelvis dated 12/22/2007  Findings: Lung bases are clear.  Unenhanced liver, spleen, pancreas, and adrenal glands within normal limits.  Gallbladder is unremarkable.  No intrahepatic or extrahepatic ductal dilatation.  At least three nonobstructing right renal calculi measuring up to 3 mm in the right lower pole (series 2/image 40).  At least four nonobstructing left renal calculi, measuring up  to 4 mm in the left upper pole (series 2/image 31).  Right extrarenal pelvis.  No hydronephrosis.  No evidence of bowel obstruction.  Status post appendectomy.  No evidence of abdominal aortic aneurysm.  No abdominopelvic ascites.  No suspicious abdominopelvic lymphadenopathy.  Uterus and bilateral ovaries are unremarkable.  No ureteral or bladder calculi.  Visualized osseous structures are within normal limits.  IMPRESSION: Tiny bilateral nonobstructing calculi, measuring up to 3 mm in the right lower pole and 4 mm in the left upper pole.  No hydronephrosis.  No ureteral or bladder calculi.  Original Report Authenticated By: Julian Hy, M.D.   Dg Chest 2vsame Day  11/12/2011  *RADIOLOGY REPORT*  Clinical Data: Cough and fever  CHEST - 2 VIEW SAME DAY  Comparison: 12/26/2007  Findings: The heart size appears normal.  There is airspace consolidation involving much of the left lower lobe.   The right lung appears clear.  The visualized osseous structures are unremarkable.  IMPRESSION:  1.  Left lower lobe pneumonia.  Original Report Authenticated By: Angelita Ingles, M.D.    Brief H and P: For complete details please refer to admission H and P, but in brief  Cindy [name] is an 20 y.o. female with history of iron deficiency anemia, prior UTI, recently diagnosed with pyelonephritis and nonobstructing nephrolithiasis, treated with Cipro, presents back to the emergency room for persistent nausea and vomiting. She has not been able to take her medications. She has mild back pain but denied  fever or chills, shortness of breath or chest pain. She has no dysuria. She is aware that she has a heart murmur, but does not believe she ever had an echo.    Hospital Course:  Pyelonephritis/Nephrolitiasis : Patient was initially  treated with IV unasn ,however antibiotics were changed after she developed pneumonia .urine culture still pending ,please follow as outpatient. CT abdomen showed bilateral non  obstructing calculi as above  Nausea and vomiting: secondary to the above,now  resolved  Hospital acquired pneumonia Patient developed cough and hypoxemia  in the hospital ,CXR showed PNA,she was treated with vancomycin ,cefepime and levaquin ,will discharge on PO levaquin for more 8 days to complete 10 days of antibiotics .she was weaned off o2 ans saturating 95% on RA. Mitral valve prolapse on echo: Patient is a symptomatic ,to follow with PCP as outpatient   Subjective: Patient seen and examined ,feeling better ,not in distress   Filed Vitals:   11/14/11 0555  BP: 123/82  Pulse: 92  Temp: 98.6 F (37 C)  Resp: 18    General: Alert, awake, oriented x3, in no acute distress.  Heart: Regular rate and rhythm, without murmurs, rubs, gallops.  Lungs: Clear to auscultation bilaterally.  Abdomen: Soft, nontender, nondistended, positive bowel sounds.  Extremities: No clubbing cyanosis or edema with positive pedal pulses.  Neuro: Grossly intact, nonfocal.    Disposition and Follow-up: Home Follow with PCP in one week   Time spent on Discharge:  50 minutes    Signed: Gregoria Selvy 11/14/2011, 6:51 AM

## 2012-02-14 ENCOUNTER — Encounter (HOSPITAL_COMMUNITY): Payer: Self-pay | Admitting: Emergency Medicine

## 2012-02-14 ENCOUNTER — Emergency Department (HOSPITAL_COMMUNITY)
Admission: EM | Admit: 2012-02-14 | Discharge: 2012-02-15 | Disposition: A | Discharge status: Home / Self Care | Payer: Managed Care, Other (non HMO) | Attending: Emergency Medicine | Admitting: Emergency Medicine

## 2012-02-14 DIAGNOSIS — R197 Diarrhea, unspecified: Secondary | ICD-10-CM | POA: Insufficient documentation

## 2012-02-14 DIAGNOSIS — K117 Disturbances of salivary secretion: Secondary | ICD-10-CM | POA: Insufficient documentation

## 2012-02-14 DIAGNOSIS — R000 Tachycardia, unspecified: Secondary | ICD-10-CM | POA: Insufficient documentation

## 2012-02-14 DIAGNOSIS — D72829 Elevated white blood cell count, unspecified: Secondary | ICD-10-CM | POA: Insufficient documentation

## 2012-02-14 DIAGNOSIS — R1110 Vomiting, unspecified: Secondary | ICD-10-CM

## 2012-02-14 DIAGNOSIS — R231 Pallor: Secondary | ICD-10-CM | POA: Insufficient documentation

## 2012-02-14 DIAGNOSIS — E860 Dehydration: Secondary | ICD-10-CM | POA: Insufficient documentation

## 2012-02-14 DIAGNOSIS — R112 Nausea with vomiting, unspecified: Secondary | ICD-10-CM | POA: Insufficient documentation

## 2012-02-14 LAB — DIFFERENTIAL
Basophils Absolute: 0 10*3/uL (ref 0.0–0.1)
Basophils Relative: 0 % (ref 0–1)
Monocytes Absolute: 0.4 10*3/uL (ref 0.1–1.0)
Neutro Abs: 10.5 10*3/uL — ABNORMAL HIGH (ref 1.7–7.7)
Neutrophils Relative %: 92 % — ABNORMAL HIGH (ref 43–77)

## 2012-02-14 LAB — CBC
MCHC: 32.1 g/dL (ref 30.0–36.0)
Platelets: 315 10*3/uL (ref 150–400)
RDW: 14 % (ref 11.5–15.5)

## 2012-02-14 LAB — BASIC METABOLIC PANEL
Chloride: 105 mEq/L (ref 96–112)
Creatinine, Ser: 0.69 mg/dL (ref 0.50–1.10)
GFR calc Af Amer: >90 mL/min (ref >90)
Potassium: 4 mEq/L (ref 3.5–5.1)

## 2012-02-14 LAB — URINALYSIS, ROUTINE W REFLEX MICROSCOPIC
Ketones, ur: 40 mg/dL — AB
Leukocytes, UA: NEGATIVE
Nitrite: NEGATIVE
Protein, ur: NEGATIVE mg/dL
Urobilinogen, UA: 0.2 mg/dL (ref 0.0–1.0)

## 2012-02-14 MED ORDER — SODIUM CHLORIDE 0.9 % IV BOLUS (SEPSIS)
2000.0000 mL | Freq: Once | INTRAVENOUS | Status: AC
  Administered 2012-02-14: 2000 mL via INTRAVENOUS

## 2012-02-14 MED ORDER — ONDANSETRON 8 MG PO TBDP: 8.0000 mg | ORAL_TABLET | Freq: Three times a day (TID) | ORAL | Status: AC | PRN

## 2012-02-14 MED ORDER — SODIUM CHLORIDE 0.9 % IV SOLN
INTRAVENOUS | Status: DC
  Administered 2012-02-14 – 2012-02-15 (×2): via INTRAVENOUS

## 2012-02-14 MED ORDER — ONDANSETRON HCL 4 MG/2ML IJ SOLN
4.0000 mg | INTRAMUSCULAR | Status: DC | PRN
  Administered 2012-02-14: 4 mg via INTRAVENOUS
  Filled 2012-02-14: qty 2

## 2012-02-14 MED ORDER — PROMETHAZINE HCL 25 MG RE SUPP: 25.0000 mg | Freq: Four times a day (QID) | RECTAL | Status: DC | PRN

## 2012-02-14 NOTE — Discharge Instructions (Signed)
Drink plenty of fluids (clear liquids) the next 12-24 hours then start the BRAT diet. . Use the zofran or the phenergan suppositories for nausea or vomiting. Take imodium OTC for diarrhea. Avoid mild products until the diarrhea is gone. Recheck if you get worse.

## 2012-02-14 NOTE — ED Notes (Signed)
PT. REPORTS VOMITTING AND DIARRHEA ONSET THIS MORNING , DENIES FEVER OR CHILLS, NO ABDOMINAL PAIN .

## 2012-02-14 NOTE — ED Provider Notes (Signed)
History     CSN: [id number]  Arrival date & time 02/14/12  [id number]   First MD Initiated Contact with Patient 02/14/12 2201      Chief Complaint  Patient presents with  . Diarrhea    (Consider location/radiation/quality/duration/timing/severity/associated sxs/prior treatment) HPI  Patient relates about 5 AM she was awakened with nausea and vomiting over 10 times and diarrhea about 5 times. She states the diarrhea is watery and loose. She denies any abdominal pain or feeling dizzy. She states however if she stands up for a prolonged period of time she starts vomiting and having dry heaves. She denies any fever. She states her physician called in Phenergan tablets today however they have not controlled her vomiting. She denies being around anybody else is sick.  PCP Dr. Harlan Stains  Past Medical History  Diagnosis Date  . UTI (lower urinary tract infection)   . Anemia     Past Surgical History  Procedure Date  . Appendectomy     No family history on file.  History  Substance Use Topics  . Smoking status: Never Smoker   . Smokeless tobacco: Never Used  . Alcohol Use: Yes     occa   employed  OB History    Grav Para Term Preterm Abortions TAB SAB Ect Mult Living                  Review of Systems  All other systems reviewed and are negative.    Allergies  Review of patient's allergies indicates no known allergies.  Home Medications   Current Outpatient Rx  Name Route Sig Dispense Refill  . PROMETHAZINE HCL 25 MG PO TABS Oral Take 25 mg by mouth every 6 (six) hours as needed. For nausea      BP 109/64  Pulse 106  Temp(Src) 98.3 F (36.8 C) (Oral)  Resp 16  SpO2 100%  LMP 02/07/2012  Vital signs normal except tachycardia   Physical Exam  Nursing note and vitals reviewed. Constitutional: She is oriented to person, place, and time. She appears well-developed and well-nourished.  Non-toxic appearance. She does not appear ill. No distress.  HENT:    Head: Normocephalic and atraumatic.  Right Ear: External ear normal.  Left Ear: External ear normal.  Nose: Nose normal. No mucosal edema or rhinorrhea.  Mouth/Throat: Mucous membranes are normal. No dental abscesses or uvula swelling.       Mucous membranes are dry  Eyes: Conjunctivae and EOM are normal. Pupils are equal, round, and reactive to light.  Neck: Normal range of motion and full passive range of motion without pain. Neck supple.  Cardiovascular: Regular rhythm and normal heart sounds.  Tachycardia present.  Exam reveals no gallop and no friction rub.   No murmur heard. Pulmonary/Chest: Effort normal and breath sounds normal. No respiratory distress. She has no wheezes. She has no rhonchi. She has no rales. She exhibits no tenderness and no crepitus.  Abdominal: Soft. Normal appearance and bowel sounds are normal. She exhibits no distension. There is no tenderness. There is no rebound and no guarding.  Musculoskeletal: Normal range of motion. She exhibits no edema and no tenderness.       Moves all extremities well.   Neurological: She is alert and oriented to person, place, and time. She has normal strength. No cranial nerve deficit.  Skin: Skin is warm, dry and intact. No rash noted. No erythema. There is pallor.  Psychiatric: She has a normal mood and affect. Her  speech is normal and behavior is normal. Her mood appears not anxious.       Affect is flat    ED Course  Procedures (including critical care time)   Medications  0.9 %  sodium chloride infusion (not administered)  ondansetron (ZOFRAN) injection 4 mg (4 mg Intravenous Given 02/14/12 2308)  promethazine (PHENERGAN) 25 MG suppository (not administered)  sodium chloride 0.9 % bolus 2,000 mL (2000 mL Intravenous Given 02/14/12 2308)   23:48 recheck after first liter, she is feeling better, wants to try oral fluids.   Results for orders placed during the hospital encounter of 02/14/12  URINALYSIS, ROUTINE W REFLEX  MICROSCOPIC      Component Value Range   Color, Urine YELLOW  YELLOW    APPearance CLEAR  CLEAR    Specific Gravity, Urine 1.030  1.005 - 1.030    pH 5.5  5.0 - 8.0    Glucose, UA NEGATIVE  NEGATIVE (mg/dL)   Hgb urine dipstick NEGATIVE  NEGATIVE    Bilirubin Urine SMALL (*) NEGATIVE    Ketones, ur 40 (*) NEGATIVE (mg/dL)   Protein, ur NEGATIVE  NEGATIVE (mg/dL)   Urobilinogen, UA 0.2  0.0 - 1.0 (mg/dL)   Nitrite NEGATIVE  NEGATIVE    Leukocytes, UA NEGATIVE  NEGATIVE   CBC      Component Value Range   WBC 11.4 (*) 4.0 - 10.5 (K/uL)   RBC 5.13 (*) 3.87 - 5.11 (MIL/uL)   Hemoglobin 12.6  12.0 - 15.0 (g/dL)   HCT 39.3  36.0 - 46.0 (%)   MCV 76.6 (*) 78.0 - 100.0 (fL)   MCH 24.6 (*) 26.0 - 34.0 (pg)   MCHC 32.1  30.0 - 36.0 (g/dL)   RDW 14.0  11.5 - 15.5 (%)   Platelets 315  150 - 400 (K/uL)  DIFFERENTIAL      Component Value Range   Neutrophils Relative 92 (*) 43 - 77 (%)   Neutro Abs 10.5 (*) 1.7 - 7.7 (K/uL)   Lymphocytes Relative 5 (*) 12 - 46 (%)   Lymphs Abs 0.5 (*) 0.7 - 4.0 (K/uL)   Monocytes Relative 3  3 - 12 (%)   Monocytes Absolute 0.4  0.1 - 1.0 (K/uL)   Eosinophils Relative 0  0 - 5 (%)   Eosinophils Absolute 0.0  0.0 - 0.7 (K/uL)   Basophils Relative 0  0 - 1 (%)   Basophils Absolute 0.0  0.0 - 0.1 (K/uL)  BASIC METABOLIC PANEL      Component Value Range   Sodium 141  135 - 145 (mEq/L)   Potassium 4.0  3.5 - 5.1 (mEq/L)   Chloride 105  96 - 112 (mEq/L)   CO2 23  19 - 32 (mEq/L)   Glucose, Bld 99  70 - 99 (mg/dL)   BUN 17  6 - 23 (mg/dL)   Creatinine, Ser 0.69  0.50 - 1.10 (mg/dL)   Calcium 9.4  8.4 - 10.5 (mg/dL)   GFR calc non Af Amer >90  >90 (mL/min)   GFR calc Af Amer >90  >90 (mL/min)  POCT PREGNANCY, URINE      Component Value Range   Preg Test, Ur NEGATIVE  NEGATIVE    Laboratory interpretation all normal except leukocytosis    1. Vomiting and diarrhea   2. Dehydration    Patient's Medications  New Prescriptions   ONDANSETRON (ZOFRAN  ODT) 8 MG DISINTEGRATING TABLET    Take 1 tablet (8 mg total) by mouth every  8 (eight) hours as needed for nausea.   PROMETHAZINE (PHENERGAN) 25 MG SUPPOSITORY    Place 1 suppository (25 mg total) rectally every 6 (six) hours as needed for nausea.   Plan discharge Rolland Porter, MD, [place], MD 02/16/12 [phone]

## 2012-02-15 MED ORDER — METOCLOPRAMIDE HCL 5 MG/ML IJ SOLN
10.0000 mg | Freq: Once | INTRAMUSCULAR | Status: AC
  Administered 2012-02-15: 10 mg via INTRAVENOUS
  Filled 2012-02-15: qty 2

## 2012-02-15 NOTE — ED Notes (Signed)
Sprite on ice given - pt tolerating well

## 2012-02-15 NOTE — ED Notes (Signed)
Ginger Ale given to pt for oral trial

## 2012-07-18 ENCOUNTER — Encounter (HOSPITAL_COMMUNITY): Payer: Self-pay | Admitting: Emergency Medicine

## 2012-07-18 ENCOUNTER — Emergency Department (HOSPITAL_COMMUNITY)
Admission: EM | Admit: 2012-07-18 | Discharge: 2012-07-19 | Disposition: A | Discharge status: Home / Self Care | Payer: Managed Care, Other (non HMO) | Attending: Emergency Medicine | Admitting: Emergency Medicine

## 2012-07-18 DIAGNOSIS — N23 Unspecified renal colic: Secondary | ICD-10-CM

## 2012-07-18 LAB — CBC WITH DIFFERENTIAL/PLATELET
Basophils Relative: 0 % (ref 0–1)
Eosinophils Relative: 1 % (ref 0–5)
Hemoglobin: 10.7 g/dL — ABNORMAL LOW (ref 12.0–15.0)
Lymphs Abs: 1.2 10*3/uL (ref 0.7–4.0)
MCH: 20.8 pg — ABNORMAL LOW (ref 26.0–34.0)
MCHC: 30.5 g/dL (ref 30.0–36.0)
MCV: 68.3 fL — ABNORMAL LOW (ref 78.0–100.0)
Monocytes Absolute: 0.5 10*3/uL (ref 0.1–1.0)
RBC: 5.14 MIL/uL — ABNORMAL HIGH (ref 3.87–5.11)

## 2012-07-18 LAB — URINALYSIS, ROUTINE W REFLEX MICROSCOPIC
Bilirubin Urine: NEGATIVE
Glucose, UA: NEGATIVE mg/dL
Ketones, ur: NEGATIVE mg/dL
Protein, ur: NEGATIVE mg/dL

## 2012-07-18 LAB — BASIC METABOLIC PANEL
BUN: 12 mg/dL (ref 6–23)
CO2: 23 mEq/L (ref 19–32)
Calcium: 9.5 mg/dL (ref 8.4–10.5)
Creatinine, Ser: 0.86 mg/dL (ref 0.50–1.10)
Glucose, Bld: 128 mg/dL — ABNORMAL HIGH (ref 70–99)

## 2012-07-18 LAB — POCT PREGNANCY, URINE: Preg Test, Ur: NEGATIVE

## 2012-07-18 LAB — URINE MICROSCOPIC-ADD ON

## 2012-07-18 MED ORDER — KETOROLAC TROMETHAMINE 60 MG/2ML IM SOLN
60.0000 mg | Freq: Once | INTRAMUSCULAR | Status: AC
  Administered 2012-07-18: 60 mg via INTRAMUSCULAR
  Filled 2012-07-18: qty 2

## 2012-07-18 MED ORDER — PERCOCET 5-325 MG PO TABS: ORAL_TABLET | ORAL | Status: DC

## 2012-07-18 MED ORDER — KETOROLAC TROMETHAMINE 10 MG PO TABS: ORAL_TABLET | ORAL | Status: DC

## 2012-07-18 MED ORDER — TAMSULOSIN HCL 0.4 MG PO CAPS: ORAL_CAPSULE | ORAL | Status: DC

## 2012-07-18 MED ORDER — PROMETHAZINE HCL 25 MG RE SUPP: 25.0000 mg | Freq: Four times a day (QID) | RECTAL | Status: DC | PRN

## 2012-07-18 NOTE — ED Notes (Signed)
Pt c/o left sided flank pain that radiates around lateral abd to lower left abd started this morning. Pt reports she also has an increased urgency to urinate. Pt reports she just ate and has been able to keep food and fluids down.

## 2012-07-18 NOTE — ED Provider Notes (Signed)
History     CSN: [id number]  Arrival date & time 07/18/12  1502   First MD Initiated Contact with Patient 07/18/12 2225      Chief Complaint  Patient presents with  . Flank Pain    (Consider location/radiation/quality/duration/timing/severity/associated sxs/prior treatment) HPI  Patient relates this morning she had some urgency that when she went to the bathroom felt like she couldn't urinate. She started getting a pressure or disc scribed as a pain in her left flank that radiated into her left abdomen that got very intense about 2:30 PM. She reports she's had diarrhea scattered throughout the day and has had about 7 or 8 episodes. She also relates she's had 6-7 episodes of vomiting that started when she started getting the pain in her flank and abdomen. She states the pain became intense described as stabbing. She states she was extremely uncomfortable and could not find a comfortable position to stop the pain. She reports about 3-4 hours ago while waiting in the ED waiting room her pain has gotten much better. She relates she only has minimal discomfort now. She states she's had similar pain before with urinary tract infections and renal stones. She denies any nausea now. She states she has mild urgency now and states she has not had dysuria or frequency today. She denies any vaginal discharge. She relates she's not on her period currently. She states she has never passed a kidney stone although she knows she has kidney stones.  PCP Harlan Stains  Past Medical History  Diagnosis Date  . UTI (lower urinary tract infection)   . Anemia    renal stones  Past Surgical History  Procedure Date  . Appendectomy     History reviewed. No pertinent family history.  History  Substance Use Topics  . Smoking status: Never Smoker   . Smokeless tobacco: Never Used  . Alcohol Use: Yes     occa  employed  OB History    Grav Para Term Preterm Abortions TAB SAB Ect Mult Living                  Review of Systems  All other systems reviewed and are negative.    Allergies  Review of patient's allergies indicates no known allergies.  Home Medications   Current Outpatient Rx  Name Route Sig Dispense Refill  . FERROUS SULFATE 325 (65 FE) MG PO TABS Oral Take 325 mg by mouth 2 (two) times daily.    Marland Kitchen PROMETHAZINE HCL 25 MG PO TABS Oral Take 25 mg by mouth every 6 (six) hours as needed. For nausea    . KETOROLAC TROMETHAMINE 10 MG PO TABS  Take 1 po QID until gone 10 tablet 0    BP 120/66  Pulse 83  Temp 98.2 F (36.8 C) (Oral)  Resp 18  SpO2 100%  Vital signs normal    Physical Exam  Nursing note and vitals reviewed. Constitutional: She is oriented to person, place, and time. She appears well-developed and well-nourished.  Non-toxic appearance. She does not appear ill. No distress.  HENT:  Head: Normocephalic and atraumatic.  Right Ear: External ear normal.  Left Ear: External ear normal.  Nose: Nose normal. No mucosal edema or rhinorrhea.  Mouth/Throat: Oropharynx is clear and moist and mucous membranes are normal. No dental abscesses or uvula swelling.  Eyes: Conjunctivae and EOM are normal. Pupils are equal, round, and reactive to light.  Neck: Normal range of motion and full passive range of motion  without pain. Neck supple.  Cardiovascular: Normal rate, regular rhythm and normal heart sounds.  Exam reveals no gallop and no friction rub.   No murmur heard. Pulmonary/Chest: Effort normal and breath sounds normal. No respiratory distress. She has no wheezes. She has no rhonchi. She has no rales. She exhibits no tenderness and no crepitus.  Abdominal: Soft. Normal appearance and bowel sounds are normal. She exhibits no distension. There is no tenderness. There is no rebound and no guarding.  Genitourinary:       Indicates her pain was over the left flank.  Musculoskeletal: Normal range of motion. She exhibits no edema and no tenderness.       Moves all  extremities well.   Neurological: She is alert and oriented to person, place, and time. She has normal strength. No cranial nerve deficit.  Skin: Skin is warm, dry and intact. No rash noted. No erythema. No pallor.  Psychiatric: She has a normal mood and affect. Her speech is normal and behavior is normal. Her mood appears not anxious.    ED Course  Procedures (including critical care time)   Medications  ketorolac (TORADOL) injection 60 mg (not administered)   Pt states she has minimal pain, agreeable to get toradol before discharge.   Review of her prior charts shows her last abdominal/pelvic CT scan was in December 2012. At that time she had 3 intrarenal stones measuring up to 3 mm in size and 4 renal stones on the left measuring up to 4 mm in size.  Patient's symptoms today are consistent with passing a left ureteral stone. Her pain is now gone. Further testing was not done since she has known renal stones.   Results for orders placed during the hospital encounter of 07/18/12  CBC WITH DIFFERENTIAL      Component Value Range   WBC 11.7 (*) 4.0 - 10.5 K/uL   RBC 5.14 (*) 3.87 - 5.11 MIL/uL   Hemoglobin 10.7 (*) 12.0 - 15.0 g/dL   HCT 35.1 (*) 36.0 - 46.0 %   MCV 68.3 (*) 78.0 - 100.0 fL   MCH 20.8 (*) 26.0 - 34.0 pg   MCHC 30.5  30.0 - 36.0 g/dL   RDW 17.0 (*) 11.5 - 15.5 %   Platelets 317  150 - 400 K/uL   Neutrophils Relative 85 (*) 43 - 77 %   Lymphocytes Relative 10 (*) 12 - 46 %   Monocytes Relative 4  3 - 12 %   Eosinophils Relative 1  0 - 5 %   Basophils Relative 0  0 - 1 %   Neutro Abs 9.9 (*) 1.7 - 7.7 K/uL   Lymphs Abs 1.2  0.7 - 4.0 K/uL   Monocytes Absolute 0.5  0.1 - 1.0 K/uL   Eosinophils Absolute 0.1  0.0 - 0.7 K/uL   Basophils Absolute 0.0  0.0 - 0.1 K/uL   Smear Review MORPHOLOGY UNREMARKABLE    BASIC METABOLIC PANEL      Component Value Range   Sodium 142  135 - 145 mEq/L   Potassium 3.7  3.5 - 5.1 mEq/L   Chloride 107  96 - 112 mEq/L   CO2 23  19  - 32 mEq/L   Glucose, Bld 128 (*) 70 - 99 mg/dL   BUN 12  6 - 23 mg/dL   Creatinine, Ser 0.86  0.50 - 1.10 mg/dL   Calcium 9.5  8.4 - 10.5 mg/dL   GFR calc non Af Amer >90  >90  mL/min   GFR calc Af Amer >90  >90 mL/min  URINALYSIS, ROUTINE W REFLEX MICROSCOPIC      Component Value Range   Color, Urine YELLOW  YELLOW   APPearance CLEAR  CLEAR   Specific Gravity, Urine 1.015  1.005 - 1.030   pH 5.5  5.0 - 8.0   Glucose, UA NEGATIVE  NEGATIVE mg/dL   Hgb urine dipstick SMALL (*) NEGATIVE   Bilirubin Urine NEGATIVE  NEGATIVE   Ketones, ur NEGATIVE  NEGATIVE mg/dL   Protein, ur NEGATIVE  NEGATIVE mg/dL   Urobilinogen, UA 0.2  0.0 - 1.0 mg/dL   Nitrite NEGATIVE  NEGATIVE   Leukocytes, UA TRACE (*) NEGATIVE  POCT PREGNANCY, URINE      Component Value Range   Preg Test, Ur NEGATIVE  NEGATIVE  URINE MICROSCOPIC-ADD ON      Component Value Range   Squamous Epithelial / LPF MANY (*) RARE   WBC, UA 0-2  <3 WBC/hpf   RBC / HPF 3-6  <3 RBC/hpf   Bacteria, UA RARE  RARE   Laboratory interpretation all normal except mild anemia, mild leukocytosis    1. Ureteral colic    New Prescriptions   KETOROLAC (TORADOL) 10 MG TABLET    Take 1 po QID until gone   PERCOCET 5-325 MG PER TABLET    Take 1 or 2 po Q 6hrs for pain   PROMETHAZINE (PHENERGAN) 25 MG SUPPOSITORY    Place 1 suppository (25 mg total) rectally every 6 (six) hours as needed for nausea.   TAMSULOSIN HCL (FLOMAX) 0.4 MG CAPS    Take 1 po QD until you pass the stone.    Plan discharge  Rolland Porter, MD, [place], MD 07/18/12 [phone]

## 2012-07-18 NOTE — ED Notes (Signed)
Pt c/o left flank pain starting today with burning urination

## 2012-11-27 ENCOUNTER — Inpatient Hospital Stay (HOSPITAL_COMMUNITY)
Admission: AD | Admit: 2012-11-27 | Discharge: 2012-11-27 | Disposition: A | Discharge status: Home / Self Care | Payer: Managed Care, Other (non HMO) | Source: Ambulatory Visit | Attending: Obstetrics and Gynecology | Admitting: Obstetrics and Gynecology

## 2012-11-27 ENCOUNTER — Encounter (HOSPITAL_COMMUNITY): Payer: Self-pay

## 2012-11-27 DIAGNOSIS — C189 Malignant neoplasm of colon, unspecified: Secondary | ICD-10-CM

## 2012-11-27 DIAGNOSIS — J069 Acute upper respiratory infection, unspecified: Secondary | ICD-10-CM

## 2012-11-27 DIAGNOSIS — J00 Acute nasopharyngitis [common cold]: Secondary | ICD-10-CM | POA: Insufficient documentation

## 2012-11-27 DIAGNOSIS — O99891 Other specified diseases and conditions complicating pregnancy: Secondary | ICD-10-CM | POA: Insufficient documentation

## 2012-11-27 HISTORY — DX: Malignant neoplasm of colon, unspecified: C18.9

## 2012-11-27 LAB — URINALYSIS, ROUTINE W REFLEX MICROSCOPIC
Bilirubin Urine: NEGATIVE
Nitrite: NEGATIVE
Specific Gravity, Urine: 1.02 (ref 1.005–1.030)
Urobilinogen, UA: 0.2 mg/dL (ref 0.0–1.0)

## 2012-11-27 MED ORDER — DM-GUAIFENESIN ER 30-600 MG PO TB12
1.0000 | ORAL_TABLET | Freq: Two times a day (BID) | ORAL | Status: DC
  Administered 2012-11-27: 21:00:00 via ORAL
  Filled 2012-11-27 (×2): qty 1

## 2012-11-27 MED ORDER — ACETAMINOPHEN 325 MG PO TABS
650.0000 mg | ORAL_TABLET | Freq: Once | ORAL | Status: AC
  Administered 2012-11-27: 650 mg via ORAL
  Filled 2012-11-27: qty 2

## 2012-11-27 MED ORDER — PSEUDOEPHEDRINE HCL 60 MG PO TABS: 60.0000 mg | ORAL_TABLET | ORAL | Status: DC | PRN

## 2012-11-27 MED ORDER — DM-GUAIFENESIN ER 30-600 MG PO TB12: 1.0000 | ORAL_TABLET | Freq: Two times a day (BID) | ORAL | Status: DC

## 2012-11-27 MED ORDER — PSEUDOEPHEDRINE HCL 30 MG PO TABS
60.0000 mg | ORAL_TABLET | Freq: Once | ORAL | Status: AC
  Administered 2012-11-27: 60 mg via ORAL
  Filled 2012-11-27: qty 2

## 2012-11-27 NOTE — MAU Note (Signed)
Patient presents with c/o cough and sore throat since this morning, non productive cough.

## 2012-11-27 NOTE — L&D Delivery Note (Signed)
Pt had a total of 3 cytotecs placed in vagina. At one cm the pt had an amniotomy with bloody fluid and gas. Pt completed the first stage and had a SVD of one dead fetus with macerated skin and depressed skull. It did not appear c/w a fetal demise in the last 12 hours. There was a defect in the anterior abd wall superior to the umbilicus. This appeared c/w a tear in the friable wall and not a gastroschesis. There was a very foul odor to the fetus and the placenta. Placenta was delivered intact. Aerobic and anaerobic cultures were obtained. Placenta was sent to path. An autopsy was offered and declined.

## 2012-11-27 NOTE — MAU Provider Note (Signed)
History     CSN: [id number]  Arrival date and time: 11/27/12 2030   None     Chief Complaint  Patient presents with  . Cough  . Chest Pain   HPI This is a 22 y.o. female at [redacted]w[redacted]d who presents with c/o sore throat and nasal congestion since this morning. States is getting much worse. Denies fever. Denies other symptoms. Has an appt at [facility] on the 21st for New OB.  Requests ultrasound.  RN Note: Patient presents with c/o cough and sore throat since this morning, non productive cough.      OB History    Grav Para Term Preterm Abortions TAB SAB Ect Mult Living   1               Past Medical History  Diagnosis Date  . UTI (lower urinary tract infection)   . Anemia     Past Surgical History  Procedure Date  . Appendectomy     No family history on file.  History  Substance Use Topics  . Smoking status: Never Smoker   . Smokeless tobacco: Never Used  . Alcohol Use: Yes     Comment: occa    Allergies: No Known Allergies  Prescriptions prior to admission  Medication Sig Dispense Refill  . ferrous sulfate 325 (65 FE) MG tablet Take 325 mg by mouth 2 (two) times daily.      . Prenatal Vit-Fe Fumarate-FA (PRENATAL MULTIVITAMIN) TABS Take 1 tablet by mouth daily.      . promethazine (PHENERGAN) 25 MG suppository Place 1 suppository (25 mg total) rectally every 6 (six) hours as needed for nausea.  6 each  0  . promethazine (PHENERGAN) 25 MG suppository Place 1 suppository (25 mg total) rectally every 6 (six) hours as needed for nausea.  8 each  0  . [DISCONTINUED] ketorolac (TORADOL) 10 MG tablet Take 1 po QID until gone  10 tablet  0  . [DISCONTINUED] PERCOCET 5-325 MG per tablet Take 1 or 2 po Q 6hrs for pain  10 tablet  0  . [DISCONTINUED] promethazine (PHENERGAN) 25 MG tablet Take 25 mg by mouth every 6 (six) hours as needed. For nausea      . [DISCONTINUED] Tamsulosin HCl (FLOMAX) 0.4 MG CAPS Take 1 po QD until you pass the stone.  10 capsule  0     ROS See HPI  Physical Exam   Blood pressure 114/46, pulse 97, temperature 98.5 F (36.9 C), resp. rate 18, SpO2 97.00%.  Physical Exam  Constitutional: She is oriented to person, place, and time. She appears well-developed and well-nourished. No distress.  HENT:  Head: Normocephalic.  Nose: Nose normal.  Mouth/Throat: Oropharynx is clear and moist. No oropharyngeal exudate.       Clear nasal discharge   Eyes: Left eye exhibits no discharge.  Cardiovascular: Normal rate.   Respiratory: Effort normal.  GI: Soft. There is no tenderness.       Bedside US:  No fetus visible on bedside US.  Denies pain or bleeding. Uterus nontender to ultrasound.   Musculoskeletal: Normal range of motion.  Neurological: She is alert and oriented to person, place, and time.  Skin: Skin is warm and dry.  Psychiatric: She has a normal mood and affect.    MAU Course  Procedures  Assessment and Plan  A:  Pregnancy at [redacted]w[redacted]d       Common Cold, no fever or evidence of flu  P:  Discharge home  Gave Tylenol, Mucinex and Sudafed here and will Rx for home use      Followup with office. Instructed to call office tomorrow to request ultrasound due to S<D  [facility] 11/27/2012, 9:46 PM

## 2012-12-25 ENCOUNTER — Encounter (HOSPITAL_COMMUNITY): Payer: Self-pay

## 2012-12-25 ENCOUNTER — Inpatient Hospital Stay (HOSPITAL_COMMUNITY)
Admission: AD | Admit: 2012-12-25 | Discharge: 2012-12-25 | Disposition: A | Discharge status: Home / Self Care | Payer: Managed Care, Other (non HMO) | Source: Ambulatory Visit | Attending: Obstetrics and Gynecology | Admitting: Obstetrics and Gynecology

## 2012-12-25 DIAGNOSIS — N390 Urinary tract infection, site not specified: Secondary | ICD-10-CM | POA: Insufficient documentation

## 2012-12-25 DIAGNOSIS — O2340 Unspecified infection of urinary tract in pregnancy, unspecified trimester: Secondary | ICD-10-CM

## 2012-12-25 DIAGNOSIS — O2390 Unspecified genitourinary tract infection in pregnancy, unspecified trimester: Secondary | ICD-10-CM | POA: Insufficient documentation

## 2012-12-25 DIAGNOSIS — O219 Vomiting of pregnancy, unspecified: Secondary | ICD-10-CM

## 2012-12-25 DIAGNOSIS — O210 Mild hyperemesis gravidarum: Secondary | ICD-10-CM | POA: Insufficient documentation

## 2012-12-25 LAB — URINE MICROSCOPIC-ADD ON

## 2012-12-25 LAB — URINALYSIS, ROUTINE W REFLEX MICROSCOPIC
Glucose, UA: NEGATIVE mg/dL
Ketones, ur: 15 mg/dL — AB
Protein, ur: NEGATIVE mg/dL

## 2012-12-25 LAB — COMPREHENSIVE METABOLIC PANEL
Albumin: 3.2 g/dL — ABNORMAL LOW (ref 3.5–5.2)
BUN: 8 mg/dL (ref 6–23)
Calcium: 9.8 mg/dL (ref 8.4–10.5)
Creatinine, Ser: 0.67 mg/dL (ref 0.50–1.10)
Total Protein: 7.1 g/dL (ref 6.0–8.3)

## 2012-12-25 LAB — CBC
HCT: 30.5 % — ABNORMAL LOW (ref 36.0–46.0)
MCH: 18.4 pg — ABNORMAL LOW (ref 26.0–34.0)
MCHC: 29.5 g/dL — ABNORMAL LOW (ref 30.0–36.0)
MCV: 62.2 fL — ABNORMAL LOW (ref 78.0–100.0)
RDW: 17.7 % — ABNORMAL HIGH (ref 11.5–15.5)

## 2012-12-25 MED ORDER — ONDANSETRON 8 MG PO TBDP: 8.0000 mg | ORAL_TABLET | Freq: Once | ORAL | Status: DC

## 2012-12-25 MED ORDER — ONDANSETRON 8 MG PO TBDP
8.0000 mg | ORAL_TABLET | Freq: Once | ORAL | Status: AC
  Administered 2012-12-25: 8 mg via ORAL
  Filled 2012-12-25: qty 1

## 2012-12-25 MED ORDER — CEPHALEXIN 500 MG PO CAPS: 500.0000 mg | ORAL_CAPSULE | Freq: Two times a day (BID) | ORAL | Status: DC

## 2012-12-25 MED ORDER — LACTATED RINGERS IV BOLUS (SEPSIS)
1000.0000 mL | Freq: Once | INTRAVENOUS | Status: AC
  Administered 2012-12-25: 1000 mL via INTRAVENOUS

## 2012-12-25 NOTE — MAU Provider Note (Signed)
History     CSN: [id number]  Arrival date and time: 12/25/12 1638   None     Chief Complaint  Patient presents with  . Nausea  . Dizziness   HPI 22 y.o. G1P0 at [redacted]w[redacted]d with lightheadedness all day today and vomiting x 2 today. Only nausea prior to this. Taking B6 for nausea. Ate waffle, bacon and applesauce this morning, hasn't eaten since because of lack of appetite and feeling lightheaded.    Past Medical History  Diagnosis Date  . UTI (lower urinary tract infection)   . Anemia     Past Surgical History  Procedure Date  . Appendectomy     No family history on file.  History  Substance Use Topics  . Smoking status: Never Smoker   . Smokeless tobacco: Never Used  . Alcohol Use: Yes     Comment: occa    Allergies: No Known Allergies  Prescriptions prior to admission  Medication Sig Dispense Refill  . dextromethorphan-guaiFENesin (MUCINEX DM) 30-600 MG per 12 hr tablet Take 1 tablet by mouth 2 (two) times daily.  30 tablet  1  . ferrous sulfate 325 (65 FE) MG tablet Take 325 mg by mouth 2 (two) times daily.      . Prenatal Vit-Fe Fumarate-FA (PRENATAL MULTIVITAMIN) TABS Take 1 tablet by mouth daily.      . promethazine (PHENERGAN) 25 MG suppository Place 1 suppository (25 mg total) rectally every 6 (six) hours as needed for nausea.  6 each  0  . promethazine (PHENERGAN) 25 MG suppository Place 1 suppository (25 mg total) rectally every 6 (six) hours as needed for nausea.  8 each  0  . pseudoephedrine (SUDAFED) 60 MG tablet Take 1 tablet (60 mg total) by mouth every 4 (four) hours as needed for congestion.  30 tablet  0    Review of Systems  Constitutional: Positive for malaise/fatigue.  Respiratory: Negative.   Cardiovascular: Negative.   Gastrointestinal: Positive for nausea and vomiting. Negative for abdominal pain, diarrhea and constipation.  Genitourinary: Negative for dysuria, urgency, frequency, hematuria and flank pain.       Negative for vaginal  bleeding, vaginal discharge  Musculoskeletal: Negative.   Neurological: Positive for dizziness and weakness.  Psychiatric/Behavioral: Negative.    Physical Exam   Blood pressure 125/60, pulse 98, temperature 98.4 F (36.9 C), temperature source Oral, resp. rate 16, height 5\' 1"  (1.549 m), weight 115 lb (52.164 kg), SpO2 100.00%.  Physical Exam  Nursing note and vitals reviewed. Constitutional: She is oriented to person, place, and time. She appears well-developed and well-nourished. No distress.  Cardiovascular: Normal rate.   Respiratory: Effort normal.  GI: Soft. There is no tenderness.  Musculoskeletal: Normal range of motion.  Neurological: She is alert and oriented to person, place, and time.  Skin: Skin is warm and dry.  Psychiatric: She has a normal mood and affect.    MAU Course  Procedures  Results for orders placed during the hospital encounter of 12/25/12 (from the past 24 hour(s))  URINALYSIS, ROUTINE W REFLEX MICROSCOPIC     Status: Abnormal   Collection Time   12/25/12  5:00 PM      Component Value Range   Color, Urine YELLOW  YELLOW   APPearance CLOUDY (*) CLEAR   Specific Gravity, Urine >1.030 (*) 1.005 - 1.030   pH 6.0  5.0 - 8.0   Glucose, UA NEGATIVE  NEGATIVE mg/dL   Hgb urine dipstick MODERATE (*) NEGATIVE   Bilirubin Urine NEGATIVE  NEGATIVE   Ketones, ur 15 (*) NEGATIVE mg/dL   Protein, ur NEGATIVE  NEGATIVE mg/dL   Urobilinogen, UA 0.2  0.0 - 1.0 mg/dL   Nitrite NEGATIVE  NEGATIVE   Leukocytes, UA LARGE (*) NEGATIVE  URINE MICROSCOPIC-ADD ON     Status: Abnormal   Collection Time   12/25/12  5:00 PM      Component Value Range   Squamous Epithelial / LPF FEW (*) RARE   WBC, UA TOO NUMEROUS TO COUNT  <3 WBC/hpf   RBC / HPF 11-20  <3 RBC/hpf   Bacteria, UA MANY (*) RARE   Urine-Other MUCOUS PRESENT    CBC     Status: Abnormal   Collection Time   12/25/12  5:02 PM      Component Value Range   WBC 17.0 (*) 4.0 - 10.5 K/uL   RBC 4.90  3.87 - 5.11  MIL/uL   Hemoglobin 9.0 (*) 12.0 - 15.0 g/dL   HCT 30.5 (*) 36.0 - 46.0 %   MCV 62.2 (*) 78.0 - 100.0 fL   MCH 18.4 (*) 26.0 - 34.0 pg   MCHC 29.5 (*) 30.0 - 36.0 g/dL   RDW 17.7 (*) 11.5 - 15.5 %   Platelets 470 (*) 150 - 400 K/uL  COMPREHENSIVE METABOLIC PANEL     Status: Abnormal   Collection Time   12/25/12  5:02 PM      Component Value Range   Sodium 134 (*) 135 - 145 mEq/L   Potassium 3.9  3.5 - 5.1 mEq/L   Chloride 99  96 - 112 mEq/L   CO2 22  19 - 32 mEq/L   Glucose, Bld 86  70 - 99 mg/dL   BUN 8  6 - 23 mg/dL   Creatinine, Ser 0.67  0.50 - 1.10 mg/dL   Calcium 9.8  8.4 - 10.5 mg/dL   Total Protein 7.1  6.0 - 8.3 g/dL   Albumin 3.2 (*) 3.5 - 5.2 g/dL   AST 11  0 - 37 U/L   ALT 8  0 - 35 U/L   Alkaline Phosphatase 66  39 - 117 U/L   Total Bilirubin 0.2 (*) 0.3 - 1.2 mg/dL   GFR calc non Af Amer >90  >90 mL/min   GFR calc Af Amer >90  >90 mL/min    Meds ordered this encounter  Medications  . ondansetron (ZOFRAN-ODT) disintegrating tablet 8 mg    Sig:   . DISCONTD: ranitidine (ZANTAC) 150 MG tablet    Sig: Take 150 mg by mouth 2 (two) times daily.  Marland Kitchen lactated ringers bolus 1,000 mL    Sig:       Assessment and Plan   1. Nausea and vomiting in pregnancy prior to [redacted] weeks gestation   2. UTI in pregnancy, antepartum       Medication List     As of 12/25/2012  7:26 PM    START taking these medications         cephALEXin 500 MG capsule   Commonly known as: KEFLEX   Take 1 capsule (500 mg total) by mouth 2 (two) times daily.      ondansetron 8 MG disintegrating tablet   Commonly known as: ZOFRAN-ODT   Take 1 tablet (8 mg total) by mouth once.      CONTINUE taking these medications         ferrous sulfate 325 (65 FE) MG tablet      prenatal multivitamin Tabs  Where to get your medications    These are the prescriptions that you need to pick up. We sent them to a specific pharmacy, so you will need to go there to get them.   [facility]  #[id number] [address], [place] - [place] [address]    [phone]        cephALEXin 500 MG capsule   ondansetron 8 MG disintegrating tablet            Follow-up Information    Follow up with Olga Millers, MD. (as scheduled or sooner as needed)    Contact information:   [address] [place] [id number] [place] 12/25/2012, 5:00 PM

## 2012-12-25 NOTE — MAU Note (Signed)
Pt states she has been feeling light headed since this morning, and started feeling like she was going to vomit. Pt had and episode of vomiting after drinking water. Pt states she was able to tolerate breakfast

## 2012-12-25 NOTE — MAU Note (Signed)
Patient states she has had periods of being dizzy and lightheaded since this am. Has had nausea for a while and has been taking Vit B6. Vomited once today. Denies pain, bleeding or discharge.

## 2012-12-27 LAB — URINE CULTURE: Colony Count: >=100000

## 2013-01-13 ENCOUNTER — Other Ambulatory Visit: Payer: Self-pay | Admitting: Advanced Practice Midwife

## 2013-01-13 MED ORDER — ONDANSETRON 8 MG PO TBDP: 8.0000 mg | ORAL_TABLET | Freq: Once | ORAL | Status: DC

## 2013-01-13 NOTE — Progress Notes (Signed)
Pt called requesting refill of zofran, will send in rx this time, informed patient to call her regular OB doctor for refill in the future for refills.

## 2013-01-15 ENCOUNTER — Encounter (HOSPITAL_COMMUNITY): Payer: Self-pay | Admitting: *Deleted

## 2013-01-15 ENCOUNTER — Inpatient Hospital Stay (HOSPITAL_COMMUNITY)
Admission: AD | Admit: 2013-01-15 | Discharge: 2013-01-15 | Disposition: A | Discharge status: Home / Self Care | Payer: Managed Care, Other (non HMO) | Source: Ambulatory Visit | Attending: Obstetrics and Gynecology | Admitting: Obstetrics and Gynecology

## 2013-01-15 DIAGNOSIS — R112 Nausea with vomiting, unspecified: Secondary | ICD-10-CM

## 2013-01-15 DIAGNOSIS — D649 Anemia, unspecified: Secondary | ICD-10-CM

## 2013-01-15 DIAGNOSIS — O99019 Anemia complicating pregnancy, unspecified trimester: Secondary | ICD-10-CM | POA: Insufficient documentation

## 2013-01-15 DIAGNOSIS — O210 Mild hyperemesis gravidarum: Secondary | ICD-10-CM | POA: Insufficient documentation

## 2013-01-15 LAB — URINALYSIS, ROUTINE W REFLEX MICROSCOPIC
Leukocytes, UA: NEGATIVE
Nitrite: NEGATIVE
Protein, ur: NEGATIVE mg/dL
Specific Gravity, Urine: >1.03 — ABNORMAL HIGH (ref 1.005–1.030)
Urobilinogen, UA: 0.2 mg/dL (ref 0.0–1.0)

## 2013-01-15 LAB — COMPREHENSIVE METABOLIC PANEL
AST: 9 U/L (ref 0–37)
Albumin: 2.6 g/dL — ABNORMAL LOW (ref 3.5–5.2)
BUN: 7 mg/dL (ref 6–23)
Calcium: 8.6 mg/dL (ref 8.4–10.5)
Creatinine, Ser: 0.57 mg/dL (ref 0.50–1.10)
GFR calc non Af Amer: >90 mL/min (ref >90)

## 2013-01-15 LAB — CBC
HCT: 25.1 % — ABNORMAL LOW (ref 36.0–46.0)
MCH: 17.7 pg — ABNORMAL LOW (ref 26.0–34.0)
MCV: 60 fL — ABNORMAL LOW (ref 78.0–100.0)
Platelets: 393 10*3/uL (ref 150–400)
RDW: 16.9 % — ABNORMAL HIGH (ref 11.5–15.5)

## 2013-01-15 LAB — URINE MICROSCOPIC-ADD ON

## 2013-01-15 MED ORDER — METOCLOPRAMIDE HCL 5 MG/ML IJ SOLN
10.0000 mg | Freq: Once | INTRAMUSCULAR | Status: AC
  Administered 2013-01-15: 10 mg via INTRAVENOUS
  Filled 2013-01-15: qty 2

## 2013-01-15 MED ORDER — INTEGRA F 125-1 MG PO CAPS: 1.0000 | ORAL_CAPSULE | Freq: Every day | ORAL | Status: DC

## 2013-01-15 MED ORDER — LACTATED RINGERS IV BOLUS (SEPSIS)
1000.0000 mL | Freq: Once | INTRAVENOUS | Status: AC
  Administered 2013-01-15: 1000 mL via INTRAVENOUS

## 2013-01-15 MED ORDER — ONDANSETRON 8 MG PO TBDP: 8.0000 mg | ORAL_TABLET | Freq: Once | ORAL | Status: DC

## 2013-01-15 NOTE — MAU Provider Note (Signed)
History     CSN: [id number]  Arrival date and time: 01/15/13 1418   First Provider Initiated Contact with Patient 01/15/13 1641      Chief Complaint  Patient presents with  . Morning Sickness  . Abdominal Pain   HPI  Pt is a G1P0 here at 13.0 wks IUP here with report of nausea and vomiting that did not improve with antiemetic this morning.  Reports vomiting approximately four times today, unable to hold any food down.  No report of fever, diarrhea, body aches, or chills.    Past Medical History  Diagnosis Date  . UTI (lower urinary tract infection)   . Anemia     Past Surgical History  Procedure Laterality Date  . Appendectomy      Family History  Problem Relation Age of Onset  . Alcohol abuse Neg Hx   . Arthritis Neg Hx   . Asthma Neg Hx   . Birth defects Neg Hx   . Cancer Neg Hx   . COPD Neg Hx   . Depression Neg Hx   . Diabetes Neg Hx   . Drug abuse Neg Hx   . Early death Neg Hx   . Hearing loss Neg Hx   . Heart disease Neg Hx   . Hyperlipidemia Neg Hx   . Hypertension Neg Hx   . Kidney disease Neg Hx   . Learning disabilities Neg Hx   . Mental illness Neg Hx   . Mental retardation Neg Hx   . Miscarriages / Stillbirths Neg Hx   . Stroke Neg Hx   . Vision loss Neg Hx     History  Substance Use Topics  . Smoking status: Never Smoker   . Smokeless tobacco: Never Used  . Alcohol Use: Yes     Comment: occa    Allergies: No Known Allergies  Prescriptions prior to admission  Medication Sig Dispense Refill  . cephALEXin (KEFLEX) 500 MG capsule Take 1 capsule (500 mg total) by mouth 2 (two) times daily.  21 capsule  0  . ferrous sulfate 325 (65 FE) MG tablet Take 325 mg by mouth 2 (two) times daily.      . ondansetron (ZOFRAN-ODT) 8 MG disintegrating tablet Take 1 tablet (8 mg total) by mouth once.  20 tablet  2  . Prenatal Vit-Fe Fumarate-FA (PRENATAL MULTIVITAMIN) TABS Take 1 tablet by mouth daily.        Review of Systems  Gastrointestinal:  Positive for nausea and vomiting. Negative for abdominal pain, diarrhea and constipation.  Genitourinary: Negative.   All other systems reviewed and are negative.   Physical Exam   Blood pressure 118/51, pulse 95, temperature 98.3 F (36.8 C), temperature source Oral, resp. rate 16, height 5' 1.5" (1.562 m), weight 51.801 kg (114 lb 3.2 oz), SpO2 100.00%.  Physical Exam  Constitutional: She is oriented to person, place, and time. She appears well-developed and well-nourished.  HENT:  Head: Normocephalic.  Mouth/Throat: Mucous membranes are dry.  Neck: Normal range of motion. Neck supple.  Cardiovascular: Normal rate, regular rhythm and normal heart sounds.   Respiratory: Effort normal and breath sounds normal.  GI: Soft. Bowel sounds are normal. There is no tenderness.  Genitourinary: No bleeding around the vagina.  Neurological: She is alert and oriented to person, place, and time. She has normal reflexes.  Skin: Skin is warm and dry. She is not diaphoretic. There is pallor.  Psychiatric: She has a normal mood and affect.  Doppler:  162  MAU Course  Procedures IV LR 1L w/ Reglan 10 mg  Results for orders placed during the hospital encounter of 01/15/13 (from the past 24 hour(s))  URINALYSIS, ROUTINE W REFLEX MICROSCOPIC     Status: Abnormal   Collection Time    01/15/13  1:45 PM      Result Value Range   Color, Urine YELLOW  YELLOW   APPearance CLEAR  CLEAR   Specific Gravity, Urine >1.030 (*) 1.005 - 1.030   pH 5.5  5.0 - 8.0   Glucose, UA NEGATIVE  NEGATIVE mg/dL   Hgb urine dipstick SMALL (*) NEGATIVE   Bilirubin Urine NEGATIVE  NEGATIVE   Ketones, ur 15 (*) NEGATIVE mg/dL   Protein, ur NEGATIVE  NEGATIVE mg/dL   Urobilinogen, UA 0.2  0.0 - 1.0 mg/dL   Nitrite NEGATIVE  NEGATIVE   Leukocytes, UA NEGATIVE  NEGATIVE  URINE MICROSCOPIC-ADD ON     Status: None   Collection Time    01/15/13  1:45 PM      Result Value Range   Squamous Epithelial / LPF RARE  RARE    WBC, UA 3-6  <3 WBC/hpf   RBC / HPF 0-2  <3 RBC/hpf   Urine-Other MUCOUS PRESENT    CBC     Status: Abnormal   Collection Time    01/15/13  4:59 PM      Result Value Range   WBC 15.3 (*) 4.0 - 10.5 K/uL   RBC 4.18  3.87 - 5.11 MIL/uL   Hemoglobin 7.4 (*) 12.0 - 15.0 g/dL   HCT 25.1 (*) 36.0 - 46.0 %   MCV 60.0 (*) 78.0 - 100.0 fL   MCH 17.7 (*) 26.0 - 34.0 pg   MCHC 29.5 (*) 30.0 - 36.0 g/dL   RDW 16.9 (*) 11.5 - 15.5 %   Platelets 393  150 - 400 K/uL  COMPREHENSIVE METABOLIC PANEL     Status: Abnormal   Collection Time    01/15/13  4:59 PM      Result Value Range   Sodium 134 (*) 135 - 145 mEq/L   Potassium 3.8  3.5 - 5.1 mEq/L   Chloride 104  96 - 112 mEq/L   CO2 21  19 - 32 mEq/L   Glucose, Bld 94  70 - 99 mg/dL   BUN 7  6 - 23 mg/dL   Creatinine, Ser 0.57  0.50 - 1.10 mg/dL   Calcium 8.6  8.4 - 10.5 mg/dL   Total Protein 6.0  6.0 - 8.3 g/dL   Albumin 2.6 (*) 3.5 - 5.2 g/dL   AST 9  0 - 37 U/L   ALT 5  0 - 35 U/L   Alkaline Phosphatase 70  39 - 117 U/L   Total Bilirubin 0.2 (*) 0.3 - 1.2 mg/dL   GFR calc non Af Amer >90  >90 mL/min   GFR calc Af Amer >90  >90 mL/min   Consulted with Dr. Ouida Sills > IV hydration and DC home > ensure taking PO iron  Assessment and Plan  Anemia Nausea and Vomiting of Pregnancy  Plan: DC to home RX [facility] scheduled appointment  [facility] 01/15/2013, 7:35 PM

## 2013-01-15 NOTE — MAU Note (Signed)
Patient states she has been having nausea with occasional vomiting. Took last medication today. This morning had a ball of mucus come out of the vagina then started having left side pain. No bleeding.

## 2013-01-15 NOTE — MAU Note (Signed)
Pt states she has been throwing up all day even after taking nausea medication. Pt states she passed a ball of mucous

## 2013-01-28 ENCOUNTER — Encounter (HOSPITAL_COMMUNITY): Payer: Self-pay | Admitting: *Deleted

## 2013-01-28 ENCOUNTER — Inpatient Hospital Stay (HOSPITAL_COMMUNITY)
Admission: AD | Admit: 2013-01-28 | Discharge: 2013-01-28 | Disposition: A | Discharge status: Home / Self Care | Payer: Managed Care, Other (non HMO) | Source: Ambulatory Visit | Attending: Obstetrics and Gynecology | Admitting: Obstetrics and Gynecology

## 2013-01-28 DIAGNOSIS — O210 Mild hyperemesis gravidarum: Secondary | ICD-10-CM | POA: Insufficient documentation

## 2013-01-28 DIAGNOSIS — R42 Dizziness and giddiness: Secondary | ICD-10-CM | POA: Insufficient documentation

## 2013-01-28 LAB — URINE MICROSCOPIC-ADD ON

## 2013-01-28 LAB — COMPREHENSIVE METABOLIC PANEL
AST: 10 U/L (ref 0–37)
BUN: 6 mg/dL (ref 6–23)
CO2: 22 mEq/L (ref 19–32)
Calcium: 9.4 mg/dL (ref 8.4–10.5)
Chloride: 101 mEq/L (ref 96–112)
Creatinine, Ser: 0.48 mg/dL — ABNORMAL LOW (ref 0.50–1.10)
GFR calc Af Amer: >90 mL/min (ref >90)
GFR calc non Af Amer: >90 mL/min (ref >90)
Glucose, Bld: 92 mg/dL (ref 70–99)
Total Bilirubin: 0.2 mg/dL — ABNORMAL LOW (ref 0.3–1.2)

## 2013-01-28 LAB — URINALYSIS, ROUTINE W REFLEX MICROSCOPIC
Glucose, UA: NEGATIVE mg/dL
Leukocytes, UA: NEGATIVE
Protein, ur: NEGATIVE mg/dL
Specific Gravity, Urine: >1.03 — ABNORMAL HIGH (ref 1.005–1.030)
Urobilinogen, UA: 0.2 mg/dL (ref 0.0–1.0)

## 2013-01-28 MED ORDER — ONDANSETRON HCL 4 MG/2ML IJ SOLN
4.0000 mg | Freq: Once | INTRAMUSCULAR | Status: AC
  Administered 2013-01-28: 4 mg via INTRAVENOUS
  Filled 2013-01-28: qty 2

## 2013-01-28 MED ORDER — DEXTROSE 5 % IN LACTATED RINGERS IV BOLUS
1000.0000 mL | Freq: Once | INTRAVENOUS | Status: AC
  Administered 2013-01-28: 1000 mL via INTRAVENOUS

## 2013-01-28 NOTE — MAU Note (Signed)
ID and DOB verified, pt confirmed spelling of name on idband

## 2013-01-28 NOTE — MAU Note (Signed)
Dr Philis Pique called in and aware pt feels better, drinking juice, and CMP results. Pt stable for d/c home.

## 2013-01-28 NOTE — MAU Note (Signed)
Took nausea med this morning, ate breakfast, got to work, threw up. Since then has been vomiting straight blood.  Body hurts.

## 2013-01-28 NOTE — MAU Provider Note (Signed)
History     CSN: [id number]  Arrival date and time: 01/28/13 1206   None     Chief Complaint  Patient presents with  . Morning Sickness   HPI Cindy [name] is 22 y.o. G1P0 [redacted]w[redacted]d weeks presenting with vomiting, with blood, lightheadness that began this am.  Unable to eat this am.  She called Dr. Malachi Carl office and instructed to come in.  Has had nausea/vomiting the entire pregnancy.  Last seen in the office 2/28.   States she has begun care and was told she was anemia.  Taking prenatal vitamins.  HX MRSA at age 58.      Past Medical History  Diagnosis Date  . UTI (lower urinary tract infection)   . Anemia     Past Surgical History  Procedure Laterality Date  . Appendectomy    . Leg reconstruction using fascial flap      Family History  Problem Relation Age of Onset  . Alcohol abuse Neg Hx   . Arthritis Neg Hx   . Asthma Neg Hx   . Birth defects Neg Hx   . Cancer Neg Hx   . COPD Neg Hx   . Depression Neg Hx   . Diabetes Neg Hx   . Drug abuse Neg Hx   . Early death Neg Hx   . Hearing loss Neg Hx   . Heart disease Neg Hx   . Hyperlipidemia Neg Hx   . Hypertension Neg Hx   . Kidney disease Neg Hx   . Learning disabilities Neg Hx   . Mental illness Neg Hx   . Mental retardation Neg Hx   . Miscarriages / Stillbirths Neg Hx   . Stroke Neg Hx   . Vision loss Neg Hx     History  Substance Use Topics  . Smoking status: Never Smoker   . Smokeless tobacco: Never Used  . Alcohol Use: No    Allergies: No Known Allergies  Prescriptions prior to admission  Medication Sig Dispense Refill  . Fe Fum-FePoly-FA-Vit C-Vit B3 (INTEGRA F) 125-1 MG CAPS Take 1 tablet by mouth daily.  30 capsule  1  . ondansetron (ZOFRAN-ODT) 8 MG disintegrating tablet Take 1 tablet (8 mg total) by mouth once.  20 tablet  2  . Prenatal Vit-Fe Fumarate-FA (PRENATAL MULTIVITAMIN) TABS Take 1 tablet by mouth daily.      . cephALEXin (KEFLEX) 500 MG capsule Take 1 capsule (500 mg total) by  mouth 2 (two) times daily.  21 capsule  0    Review of Systems  Constitutional:       Decreased apptite  HENT:       Blood seen with vomiting  Gastrointestinal: Positive for nausea and vomiting.  Genitourinary: Negative.    Physical Exam   Blood pressure 119/56, pulse 89, temperature 98 F (36.7 C), temperature source Oral, resp. rate 16, height 5\' 1"  (1.549 m), weight 108 lb (48.988 kg).  Physical Exam  Constitutional: She is oriented to person, place, and time. She appears well-developed and well-nourished. No distress.  HENT:  Head: Normocephalic.  Respiratory: Effort normal.  GI: There is no tenderness.  Genitourinary:  Not indicated  Neurological: She is alert and oriented to person, place, and time.  Skin: Skin is warm and dry.  Psychiatric: She has a normal mood and affect. Her behavior is normal.    Results for orders placed during the hospital encounter of 01/28/13 (from the past 24 hour(s))  URINALYSIS, ROUTINE W REFLEX  MICROSCOPIC     Status: Abnormal   Collection Time    01/28/13 12:28 PM      Result Value Range   Color, Urine YELLOW  YELLOW   APPearance HAZY (*) CLEAR   Specific Gravity, Urine >1.030 (*) 1.005 - 1.030   pH 6.0  5.0 - 8.0   Glucose, UA NEGATIVE  NEGATIVE mg/dL   Hgb urine dipstick SMALL (*) NEGATIVE   Bilirubin Urine NEGATIVE  NEGATIVE   Ketones, ur NEGATIVE  NEGATIVE mg/dL   Protein, ur NEGATIVE  NEGATIVE mg/dL   Urobilinogen, UA 0.2  0.0 - 1.0 mg/dL   Nitrite NEGATIVE  NEGATIVE   Leukocytes, UA NEGATIVE  NEGATIVE  URINE MICROSCOPIC-ADD ON     Status: Abnormal   Collection Time    01/28/13 12:28 PM      Result Value Range   Squamous Epithelial / LPF MANY (*) RARE   WBC, UA 0-2  <3 WBC/hpf   RBC / HPF 7-10  <3 RBC/hpf   Bacteria, UA MANY (*) RARE   Urine-Other RARE YEAST     MAU Course  Procedures  MDM 13:30  Reported MSE and UA to Dr. Philis Pique.  Orders given for Zofran 4mg  IV, 1liter of D5LR, and CMP.  15:15  Patient is asking  for crackers.  700cc of Iv fluid has been infused.   Assessment and Plan  A:  Hyperemesis in pregnancy  P:  Instructed to see small frequent meals with bland foods      Keep scheduled appointment      Patient has Zofran at home, doesn't need new Rx  Marua Qin,EVE M 01/28/2013, 1:22 PM

## 2013-01-28 NOTE — Progress Notes (Signed)
Written and verbal d/c instructions given and understanding voiced. 

## 2013-02-25 ENCOUNTER — Encounter (HOSPITAL_COMMUNITY): Payer: Self-pay

## 2013-02-25 ENCOUNTER — Emergency Department (INDEPENDENT_AMBULATORY_CARE_PROVIDER_SITE_OTHER)
Admission: EM | Admit: 2013-02-25 | Discharge: 2013-02-25 | Disposition: A | Discharge status: Home / Self Care | Payer: Managed Care, Other (non HMO) | Source: Home / Self Care | Attending: Family Medicine | Admitting: Family Medicine

## 2013-02-25 DIAGNOSIS — Z3490 Encounter for supervision of normal pregnancy, unspecified, unspecified trimester: Secondary | ICD-10-CM

## 2013-02-25 DIAGNOSIS — R1110 Vomiting, unspecified: Secondary | ICD-10-CM

## 2013-02-25 DIAGNOSIS — Z331 Pregnant state, incidental: Secondary | ICD-10-CM

## 2013-02-25 LAB — POCT URINALYSIS DIP (DEVICE)
Glucose, UA: NEGATIVE mg/dL
Ketones, ur: 15 mg/dL — AB
Nitrite: NEGATIVE
pH: 6 (ref 5.0–8.0)

## 2013-02-25 MED ORDER — ONDANSETRON 4 MG PO TBDP: 4.0000 mg | ORAL_TABLET | Freq: Three times a day (TID) | ORAL | Status: DC | PRN

## 2013-02-25 MED ORDER — ONDANSETRON 4 MG PO TBDP
4.0000 mg | ORAL_TABLET | Freq: Once | ORAL | Status: AC
  Administered 2013-02-25: 4 mg via ORAL

## 2013-02-25 MED ORDER — ONDANSETRON 4 MG PO TBDP
ORAL_TABLET | ORAL | Status: AC
  Filled 2013-02-25: qty 1

## 2013-02-25 NOTE — ED Provider Notes (Signed)
History     CSN: [id number]  Arrival date & time 02/25/13  1307   First MD Initiated Contact with Patient 02/25/13 1427      Chief Complaint  Patient presents with  . Nausea    (Consider location/radiation/quality/duration/timing/severity/associated sxs/prior treatment) Patient is a 22 y.o. female presenting with vomiting. The history is provided by the patient. No language interpreter was used.  Emesis Severity:  Mild Timing:  Constant Quality:  Stomach contents Able to tolerate:  Liquids Progression:  Worsening Recent urination:  Normal Associated symptoms: no abdominal pain   Pt is [redacted] weeks pregnant.  Pt complains of nausea and vomiting today  Past Medical History  Diagnosis Date  . UTI (lower urinary tract infection)   . Anemia     Past Surgical History  Procedure Laterality Date  . Appendectomy    . Leg reconstruction using fascial flap      Family History  Problem Relation Age of Onset  . Alcohol abuse Neg Hx   . Arthritis Neg Hx   . Asthma Neg Hx   . Birth defects Neg Hx   . Cancer Neg Hx   . COPD Neg Hx   . Depression Neg Hx   . Diabetes Neg Hx   . Drug abuse Neg Hx   . Early death Neg Hx   . Hearing loss Neg Hx   . Heart disease Neg Hx   . Hyperlipidemia Neg Hx   . Hypertension Neg Hx   . Kidney disease Neg Hx   . Learning disabilities Neg Hx   . Mental illness Neg Hx   . Mental retardation Neg Hx   . Miscarriages / Stillbirths Neg Hx   . Stroke Neg Hx   . Vision loss Neg Hx     History  Substance Use Topics  . Smoking status: Never Smoker   . Smokeless tobacco: Never Used  . Alcohol Use: No    OB History   Grav Para Term Preterm Abortions TAB SAB Ect Mult Living   1               Review of Systems  Gastrointestinal: Positive for vomiting. Negative for abdominal pain.  All other systems reviewed and are negative.    Allergies  Review of patient's allergies indicates no known allergies.  Home Medications   Current  Outpatient Rx  Name  Route  Sig  Dispense  Refill  . cephALEXin (KEFLEX) 500 MG capsule   Oral   Take 1 capsule (500 mg total) by mouth 2 (two) times daily.   21 capsule   0   . Fe Fum-FePoly-FA-Vit C-Vit B3 (INTEGRA F) 125-1 MG CAPS   Oral   Take 1 tablet by mouth daily.   30 capsule   1   . ondansetron (ZOFRAN-ODT) 8 MG disintegrating tablet   Oral   Take 1 tablet (8 mg total) by mouth once.   20 tablet   2   . Prenatal Vit-Fe Fumarate-FA (PRENATAL MULTIVITAMIN) TABS   Oral   Take 1 tablet by mouth daily.           BP 113/57  Pulse 96  Temp(Src) 98.9 F (37.2 C) (Oral)  Resp 24  SpO2 100%  Physical Exam  Vitals reviewed. Constitutional: She is oriented to person, place, and time. She appears well-developed and well-nourished.  HENT:  Head: Normocephalic.  Neck: Normal range of motion.  Cardiovascular: Normal rate and normal heart sounds.   Pulmonary/Chest: Effort normal and  breath sounds normal.  Abdominal: Soft. Bowel sounds are normal.  Musculoskeletal: Normal range of motion.  Neurological: She is alert and oriented to person, place, and time.  Skin: Skin is warm.  Psychiatric: She has a normal mood and affect.    ED Course  Procedures (including critical care time)  Labs Reviewed  POCT URINALYSIS DIP (DEVICE) - Abnormal; Notable for the following:    Bilirubin Urine SMALL (*)    Ketones, ur 15 (*)    Hgb urine dipstick SMALL (*)    Protein, ur 30 (*)    Leukocytes, UA SMALL (*)    All other components within normal limits   No results found.   No diagnosis found.    MDM  Pt given zofran odt,  Urine shows 15 ketones.   Pt able to tolerate fluids,   Pt encouraged to drink liquids, go to women's if symptoms worsen or change.         [place], PA-C 02/25/13 [phone]

## 2013-02-25 NOTE — ED Notes (Signed)
States she has felt nauseated, vomiting , lightheaded, upper abdominal area; [redacted] weeks pregnant, pt of Dr Lenore Cordia , who she reportedly called, and has not had a called back

## 2013-02-27 NOTE — ED Provider Notes (Signed)
Medical screening examination/treatment/procedure(s) were performed by resident physician or non-physician practitioner and as supervising physician I was immediately available for consultation/collaboration.   Pauline Good MD.   Billy Fischer, MD 02/27/13 [phone]

## 2013-03-21 ENCOUNTER — Encounter (HOSPITAL_COMMUNITY): Payer: Self-pay | Admitting: *Deleted

## 2013-03-21 ENCOUNTER — Inpatient Hospital Stay (HOSPITAL_COMMUNITY)
Admission: AD | Admit: 2013-03-21 | Discharge: 2013-03-23 | DRG: 775 | Disposition: A | Discharge status: Home / Self Care | Payer: Managed Care, Other (non HMO) | Source: Ambulatory Visit | Attending: Obstetrics and Gynecology | Admitting: Obstetrics and Gynecology

## 2013-03-21 DIAGNOSIS — O9902 Anemia complicating childbirth: Secondary | ICD-10-CM | POA: Diagnosis present

## 2013-03-21 DIAGNOSIS — O2390 Unspecified genitourinary tract infection in pregnancy, unspecified trimester: Secondary | ICD-10-CM | POA: Diagnosis present

## 2013-03-21 DIAGNOSIS — O2302 Infections of kidney in pregnancy, second trimester: Secondary | ICD-10-CM

## 2013-03-21 DIAGNOSIS — O411090 Infection of amniotic sac and membranes, unspecified, unspecified trimester, not applicable or unspecified: Secondary | ICD-10-CM | POA: Diagnosis present

## 2013-03-21 DIAGNOSIS — N12 Tubulo-interstitial nephritis, not specified as acute or chronic: Secondary | ICD-10-CM | POA: Diagnosis present

## 2013-03-21 DIAGNOSIS — O364XX0 Maternal care for intrauterine death, not applicable or unspecified: Principal | ICD-10-CM | POA: Diagnosis present

## 2013-03-21 DIAGNOSIS — D649 Anemia, unspecified: Secondary | ICD-10-CM | POA: Diagnosis present

## 2013-03-21 LAB — COMPREHENSIVE METABOLIC PANEL
BUN: 6 mg/dL (ref 6–23)
CO2: 20 mEq/L (ref 19–32)
Calcium: 8.2 mg/dL — ABNORMAL LOW (ref 8.4–10.5)
Creatinine, Ser: 0.61 mg/dL (ref 0.50–1.10)
GFR calc Af Amer: >90 mL/min (ref >90)
GFR calc non Af Amer: >90 mL/min (ref >90)
Glucose, Bld: 87 mg/dL (ref 70–99)

## 2013-03-21 LAB — CBC
Hemoglobin: 6.4 g/dL — CL (ref 12.0–15.0)
MCH: 16.5 pg — ABNORMAL LOW (ref 26.0–34.0)
MCV: 55.9 fL — ABNORMAL LOW (ref 78.0–100.0)
Platelets: 453 10*3/uL — ABNORMAL HIGH (ref 150–400)
RBC: 3.88 MIL/uL (ref 3.87–5.11)

## 2013-03-21 LAB — URINALYSIS, ROUTINE W REFLEX MICROSCOPIC
Nitrite: NEGATIVE
Specific Gravity, Urine: 1.025 (ref 1.005–1.030)
Urobilinogen, UA: 0.2 mg/dL (ref 0.0–1.0)

## 2013-03-21 LAB — URINE MICROSCOPIC-ADD ON

## 2013-03-21 MED ORDER — DOCUSATE SODIUM 100 MG PO CAPS
100.0000 mg | ORAL_CAPSULE | Freq: Every day | ORAL | Status: DC
  Filled 2013-03-21: qty 1

## 2013-03-21 MED ORDER — CALCIUM CARBONATE ANTACID 500 MG PO CHEW
2.0000 | CHEWABLE_TABLET | ORAL | Status: DC | PRN
  Filled 2013-03-21: qty 2

## 2013-03-21 MED ORDER — ZOLPIDEM TARTRATE 5 MG PO TABS: 5.0000 mg | ORAL_TABLET | Freq: Every evening | ORAL | Status: DC | PRN

## 2013-03-21 MED ORDER — DEXTROSE 5 % IV SOLN
1.0000 g | INTRAVENOUS | Status: DC
  Administered 2013-03-22: 1 g via INTRAVENOUS
  Filled 2013-03-21: qty 10

## 2013-03-21 MED ORDER — ACETAMINOPHEN 325 MG PO TABS: 650.0000 mg | ORAL_TABLET | ORAL | Status: DC | PRN

## 2013-03-21 MED ORDER — ACETAMINOPHEN 160 MG/5ML PO SOLN
650.0000 mg | Freq: Once | ORAL | Status: AC
  Administered 2013-03-21: 650 mg via ORAL
  Filled 2013-03-21: qty 20.3

## 2013-03-21 MED ORDER — PRENATAL MULTIVITAMIN CH
1.0000 | ORAL_TABLET | Freq: Every day | ORAL | Status: DC
  Filled 2013-03-21: qty 1

## 2013-03-21 MED ORDER — LACTATED RINGERS IV SOLN
INTRAVENOUS | Status: DC
  Administered 2013-03-22: 02:00:00 via INTRAVENOUS

## 2013-03-21 NOTE — MAU Provider Note (Signed)
History     CSN: [id number]  Arrival date and time: 03/21/13 2125   First Provider Initiated Contact with Patient 03/21/13 2150      Chief Complaint  Patient presents with  . Foot Swelling   HPI  Cindy [name] is a 22 y.o. G1P0 at [redacted]w[redacted]d who presents today with a fever, headache, shortness of breath, vomiting and lightheadedness since about 1900 today. She did not take tylenol for her fever at home. She states that she did not call her doctors office prior to coming in today.   Past Medical History  Diagnosis Date  . UTI (lower urinary tract infection)   . Anemia     Past Surgical History  Procedure Laterality Date  . Appendectomy    . Leg reconstruction using fascial flap      Family History  Problem Relation Age of Onset  . Alcohol abuse Neg Hx   . Arthritis Neg Hx   . Asthma Neg Hx   . Birth defects Neg Hx   . Cancer Neg Hx   . COPD Neg Hx   . Depression Neg Hx   . Diabetes Neg Hx   . Drug abuse Neg Hx   . Early death Neg Hx   . Hearing loss Neg Hx   . Heart disease Neg Hx   . Hyperlipidemia Neg Hx   . Hypertension Neg Hx   . Kidney disease Neg Hx   . Learning disabilities Neg Hx   . Mental illness Neg Hx   . Mental retardation Neg Hx   . Miscarriages / Stillbirths Neg Hx   . Stroke Neg Hx   . Vision loss Neg Hx     History  Substance Use Topics  . Smoking status: Never Smoker   . Smokeless tobacco: Never Used  . Alcohol Use: No    Allergies: No Known Allergies  Prescriptions prior to admission  Medication Sig Dispense Refill  . Fe Fum-FePoly-Vit C-Vit B3 (INTEGRA) 62.5-62.5-40-3 MG CAPS Take 1 tablet by mouth daily.      . Prenatal Vit-Fe Fumarate-FA (PRENATAL MULTIVITAMIN) TABS Take 1 tablet by mouth daily at 12 noon.        Review of Systems  Constitutional: Positive for fever and chills.  Respiratory: Positive for shortness of breath.   Cardiovascular: Negative for chest pain.  Gastrointestinal: Positive for nausea and vomiting.  Negative for abdominal pain, diarrhea and constipation.  Genitourinary: Negative for dysuria, urgency and frequency.  Musculoskeletal: Positive for myalgias.  Neurological: Positive for dizziness and headaches.   Physical Exam   Blood pressure 118/48, pulse 104, temperature 100.6 F (38.1 C), temperature source Oral, resp. rate 24, height 5\' 1"  (1.549 m), weight 52.164 kg (115 lb), SpO2 98.00%.  Physical Exam  Nursing note and vitals reviewed. Constitutional: She is oriented to person, place, and time. She appears well-developed and well-nourished.  Somnolent.   Cardiovascular: Normal rate.   Respiratory: Effort normal and breath sounds normal. No respiratory distress. She has no wheezes. She has no rales. She exhibits no tenderness.  GI: Soft. She exhibits no distension. There is no tenderness.  Genitourinary:  No CVA tenderness  Neurological: She is alert and oriented to person, place, and time.  Skin: Skin is warm and dry.  Psychiatric: She has a normal mood and affect.    MAU Course  Procedures  Results for orders placed during the hospital encounter of 03/21/13 (from the past 24 hour(s))  URINALYSIS, ROUTINE W REFLEX MICROSCOPIC  Status: Abnormal   Collection Time    03/21/13 10:00 PM      Result Value Range   Color, Urine YELLOW  YELLOW   APPearance HAZY (*) CLEAR   Specific Gravity, Urine 1.025  1.005 - 1.030   pH 6.0  5.0 - 8.0   Glucose, UA NEGATIVE  NEGATIVE mg/dL   Hgb urine dipstick TRACE (*) NEGATIVE   Bilirubin Urine NEGATIVE  NEGATIVE   Ketones, ur NEGATIVE  NEGATIVE mg/dL   Protein, ur 30 (*) NEGATIVE mg/dL   Urobilinogen, UA 0.2  0.0 - 1.0 mg/dL   Nitrite NEGATIVE  NEGATIVE   Leukocytes, UA SMALL (*) NEGATIVE  URINE MICROSCOPIC-ADD ON     Status: Abnormal   Collection Time    03/21/13 10:00 PM      Result Value Range   Squamous Epithelial / LPF MANY (*) RARE   WBC, UA 0-2  <3 WBC/hpf   RBC / HPF 0-2  <3 RBC/hpf   Bacteria, UA RARE  RARE  CBC      Status: Abnormal   Collection Time    03/21/13 10:11 PM      Result Value Range   WBC 17.9 (*) 4.0 - 10.5 K/uL   RBC 3.88  3.87 - 5.11 MIL/uL   Hemoglobin 6.4 (*) 12.0 - 15.0 g/dL   HCT 21.7 (*) 36.0 - 46.0 %   MCV 55.9 (*) 78.0 - 100.0 fL   MCH 16.5 (*) 26.0 - 34.0 pg   MCHC 29.5 (*) 30.0 - 36.0 g/dL   RDW 18.0 (*) 11.5 - 15.5 %   Platelets 453 (*) 150 - 400 K/uL   2259: Spoke with Dr. Ouida Sills. He will review her chart and call back.  2322: Dr. Ouida Sills here on unit to see the patient.  Assessment and Plan  Presumed pyelonephritis Admit to women's unit.   Mathis Bud 03/21/2013, 9:50 PM

## 2013-03-21 NOTE — MAU Note (Signed)
Critical value -hgb of 6.4 reported to [facility]

## 2013-03-21 NOTE — MAU Note (Signed)
Pt states that after work she started shaking all over and feeling hot-her feet have both swollen and feels short of breath

## 2013-03-21 NOTE — H&P (Signed)
Pt is a 22 yr old black female, G1P0  At 22 weeks who was brought in by EMS for F/C and SOB. Pt states that she felt fine yesterday. She worked today and when she went home she broke out in sweats. She said she also could not catch her breath. She has not had any URI symptoms and does not have a productive cough. She has a normal appetite and bowel function. She had an Appendectomy at age 66. She has had multiple UTIs in the last 2 yrs but denies any symptoms today. She denies any pain.She has good FM.  A UA was negative. Her WBC is elevated and she is anemic. She states that she is taking FeSo4 Q day but her Hgb has dropped from 8.5 to 6.4in the last two months. She denies any blood loss. PE: HEENT-wnl, no nuchal rigidity.        Lungs - cta        Back- no CVAT        Abd- gravid, non tender. No rebound, no guarding        Exts- wnl IMP/ 1)Anemia          2) F/C- most likely diagnosis is early pyelo, given h.o. Multiple UTIs and F/Cs. No evidence of chorio.          3) IUP at 22 weeks Plan/ Admit          Rocephin

## 2013-03-22 ENCOUNTER — Inpatient Hospital Stay (HOSPITAL_COMMUNITY): Payer: Managed Care, Other (non HMO) | Admitting: Anesthesiology

## 2013-03-22 ENCOUNTER — Encounter (HOSPITAL_COMMUNITY): Payer: Self-pay | Admitting: Anesthesiology

## 2013-03-22 ENCOUNTER — Inpatient Hospital Stay (HOSPITAL_COMMUNITY): Payer: Managed Care, Other (non HMO)

## 2013-03-22 LAB — CBC
HCT: 24.9 % — ABNORMAL LOW (ref 36.0–46.0)
Hemoglobin: 6.6 g/dL — CL (ref 12.0–15.0)
MCH: 16.7 pg — ABNORMAL LOW (ref 26.0–34.0)
MCHC: 29.7 g/dL — ABNORMAL LOW (ref 30.0–36.0)
MCV: 56.2 fL — ABNORMAL LOW (ref 78.0–100.0)
Platelets: 369 10*3/uL (ref 150–400)
RBC: 3.91 MIL/uL (ref 3.87–5.11)
RDW: 18.3 % — ABNORMAL HIGH (ref 11.5–15.5)

## 2013-03-22 LAB — SAVE SMEAR: Smear Review: NONE SEEN

## 2013-03-22 LAB — INFLUENZA PANEL BY PCR (TYPE A & B)
Influenza A By PCR: NEGATIVE
Influenza B By PCR: NEGATIVE

## 2013-03-22 LAB — APTT: aPTT: 28 seconds (ref 24–37)

## 2013-03-22 LAB — PREPARE RBC (CROSSMATCH)

## 2013-03-22 MED ORDER — EPHEDRINE 5 MG/ML INJ: 10.0000 mg | INTRAVENOUS | Status: DC | PRN

## 2013-03-22 MED ORDER — EPHEDRINE 5 MG/ML INJ
10.0000 mg | INTRAVENOUS | Status: DC | PRN
  Filled 2013-03-22: qty 4

## 2013-03-22 MED ORDER — IBUPROFEN 100 MG/5ML PO SUSP
600.0000 mg | Freq: Four times a day (QID) | ORAL | Status: DC | PRN
  Administered 2013-03-22: 600 mg via ORAL
  Filled 2013-03-22 (×2): qty 30

## 2013-03-22 MED ORDER — DIBUCAINE 1 % RE OINT
1.0000 "application " | TOPICAL_OINTMENT | RECTAL | Status: DC | PRN
  Filled 2013-03-22: qty 28

## 2013-03-22 MED ORDER — ZOLPIDEM TARTRATE 5 MG PO TABS: 5.0000 mg | ORAL_TABLET | Freq: Every evening | ORAL | Status: DC | PRN

## 2013-03-22 MED ORDER — ONDANSETRON HCL 4 MG/2ML IJ SOLN: 4.0000 mg | Freq: Four times a day (QID) | INTRAMUSCULAR | Status: DC | PRN

## 2013-03-22 MED ORDER — LACTATED RINGERS IV SOLN: 500.0000 mL | INTRAVENOUS | Status: DC | PRN

## 2013-03-22 MED ORDER — PENICILLIN G POTASSIUM 5000000 UNITS IJ SOLR
2.5000 10*6.[IU] | INTRAVENOUS | Status: DC
  Filled 2013-03-22 (×3): qty 2.5

## 2013-03-22 MED ORDER — PENICILLIN G POTASSIUM 5000000 UNITS IJ SOLR
5.0000 10*6.[IU] | Freq: Once | INTRAVENOUS | Status: DC
  Filled 2013-03-22: qty 5

## 2013-03-22 MED ORDER — TETANUS-DIPHTH-ACELL PERTUSSIS 5-2.5-18.5 LF-MCG/0.5 IM SUSP
0.5000 mL | Freq: Once | INTRAMUSCULAR | Status: DC
  Filled 2013-03-22: qty 0.5

## 2013-03-22 MED ORDER — NALBUPHINE SYRINGE 5 MG/0.5 ML: 5.0000 mg | INJECTION | INTRAMUSCULAR | Status: DC | PRN

## 2013-03-22 MED ORDER — OXYTOCIN 40 UNITS IN LACTATED RINGERS INFUSION - SIMPLE MED
62.5000 mL/h | INTRAVENOUS | Status: DC
  Administered 2013-03-22: 62.5 mL/h via INTRAVENOUS
  Filled 2013-03-22: qty 1000

## 2013-03-22 MED ORDER — PHENYLEPHRINE 40 MCG/ML (10ML) SYRINGE FOR IV PUSH (FOR BLOOD PRESSURE SUPPORT)
80.0000 ug | PREFILLED_SYRINGE | INTRAVENOUS | Status: DC | PRN
  Filled 2013-03-22: qty 5

## 2013-03-22 MED ORDER — IBUPROFEN 600 MG PO TABS: 600.0000 mg | ORAL_TABLET | Freq: Four times a day (QID) | ORAL | Status: DC | PRN

## 2013-03-22 MED ORDER — LACTATED RINGERS IV SOLN: INTRAVENOUS | Status: DC

## 2013-03-22 MED ORDER — PHENYLEPHRINE 40 MCG/ML (10ML) SYRINGE FOR IV PUSH (FOR BLOOD PRESSURE SUPPORT): 80.0000 ug | PREFILLED_SYRINGE | INTRAVENOUS | Status: DC | PRN

## 2013-03-22 MED ORDER — SIMETHICONE 80 MG PO CHEW: 80.0000 mg | CHEWABLE_TABLET | ORAL | Status: DC | PRN

## 2013-03-22 MED ORDER — MISOPROSTOL 25 MCG QUARTER TABLET
100.0000 ug | ORAL_TABLET | ORAL | Status: DC
  Administered 2013-03-22: 100 ug via VAGINAL
  Filled 2013-03-22: qty 1

## 2013-03-22 MED ORDER — FENTANYL 2.5 MCG/ML BUPIVACAINE 1/10 % EPIDURAL INFUSION (WH - ANES)
14.0000 mL/h | INTRAMUSCULAR | Status: DC | PRN
  Administered 2013-03-22: 14 mL/h via EPIDURAL
  Filled 2013-03-22 (×2): qty 125

## 2013-03-22 MED ORDER — LACTATED RINGERS IV SOLN
500.0000 mL | Freq: Once | INTRAVENOUS | Status: AC
  Administered 2013-03-22: 500 mL via INTRAVENOUS

## 2013-03-22 MED ORDER — ONDANSETRON HCL 4 MG/2ML IJ SOLN: 4.0000 mg | INTRAMUSCULAR | Status: DC | PRN

## 2013-03-22 MED ORDER — IBUPROFEN 600 MG PO TABS
600.0000 mg | ORAL_TABLET | Freq: Four times a day (QID) | ORAL | Status: DC
  Filled 2013-03-22: qty 1

## 2013-03-22 MED ORDER — ONDANSETRON HCL 4 MG PO TABS: 4.0000 mg | ORAL_TABLET | ORAL | Status: DC | PRN

## 2013-03-22 MED ORDER — OXYCODONE-ACETAMINOPHEN 5-325 MG PO TABS: 1.0000 | ORAL_TABLET | ORAL | Status: DC | PRN

## 2013-03-22 MED ORDER — LIDOCAINE HCL (PF) 1 % IJ SOLN
INTRAMUSCULAR | Status: DC | PRN
  Administered 2013-03-22: 3 mL
  Administered 2013-03-22: 4 mL

## 2013-03-22 MED ORDER — FENTANYL 2.5 MCG/ML BUPIVACAINE 1/10 % EPIDURAL INFUSION (WH - ANES)
INTRAMUSCULAR | Status: DC | PRN
  Administered 2013-03-22: 12 mL/h via EPIDURAL

## 2013-03-22 MED ORDER — MEASLES, MUMPS & RUBELLA VAC ~~LOC~~ INJ
0.5000 mL | INJECTION | Freq: Once | SUBCUTANEOUS | Status: DC
  Filled 2013-03-22: qty 0.5

## 2013-03-22 MED ORDER — ACETAMINOPHEN 160 MG/5ML PO SOLN
1000.0000 mg | Freq: Once | ORAL | Status: AC
  Administered 2013-03-22: 1000 mg via ORAL
  Filled 2013-03-22: qty 40.6

## 2013-03-22 MED ORDER — DIPHENHYDRAMINE HCL 50 MG/ML IJ SOLN: 12.5000 mg | INTRAMUSCULAR | Status: DC | PRN

## 2013-03-22 MED ORDER — BENZOCAINE-MENTHOL 20-0.5 % EX AERO
1.0000 "application " | INHALATION_SPRAY | CUTANEOUS | Status: DC | PRN
  Filled 2013-03-22: qty 56

## 2013-03-22 MED ORDER — ACETAMINOPHEN 500 MG PO TABS
1000.0000 mg | ORAL_TABLET | Freq: Once | ORAL | Status: DC
  Filled 2013-03-22: qty 2

## 2013-03-22 MED ORDER — MISOPROSTOL 200 MCG PO TABS
200.0000 ug | ORAL_TABLET | ORAL | Status: DC
  Administered 2013-03-22 (×2): 200 ug via VAGINAL
  Filled 2013-03-22 (×2): qty 1

## 2013-03-22 MED ORDER — BUTORPHANOL TARTRATE 1 MG/ML IJ SOLN
1.0000 mg | INTRAMUSCULAR | Status: DC | PRN
  Administered 2013-03-22: 1 mg via INTRAVENOUS
  Filled 2013-03-22: qty 1

## 2013-03-22 MED ORDER — DEXTROSE 5 % IV SOLN
1.0000 g | Freq: Once | INTRAVENOUS | Status: AC
  Administered 2013-03-22: 1 g via INTRAVENOUS
  Filled 2013-03-22: qty 10

## 2013-03-22 MED ORDER — KCL-LACTATED RINGERS 20 MEQ/L IV SOLN
INTRAVENOUS | Status: DC
  Administered 2013-03-22 (×2): via INTRAVENOUS
  Filled 2013-03-22 (×4): qty 1000

## 2013-03-22 MED ORDER — ACETAMINOPHEN 325 MG PO TABS: 650.0000 mg | ORAL_TABLET | ORAL | Status: DC | PRN

## 2013-03-22 MED ORDER — OXYTOCIN BOLUS FROM INFUSION: 500.0000 mL | INTRAVENOUS | Status: DC

## 2013-03-22 MED ORDER — WITCH HAZEL-GLYCERIN EX PADS: 1.0000 "application " | MEDICATED_PAD | CUTANEOUS | Status: DC | PRN

## 2013-03-22 NOTE — Anesthesia Preprocedure Evaluation (Signed)
Anesthesia Evaluation  Patient identified by MRN, date of birth, ID band Patient awake    Reviewed: Allergy & Precautions, H&P , NPO status , Patient's Chart, lab work & pertinent test results  Airway Mallampati: III TM Distance: >3 FB Neck ROM: full    Dental no notable dental hx. (+) Teeth Intact   Pulmonary neg pulmonary ROS, pneumonia -, resolved,  breath sounds clear to auscultation  Pulmonary exam normal       Cardiovascular negative cardio ROS  Rhythm:regular Rate:Normal     Neuro/Psych negative neurological ROS  negative psych ROS   GI/Hepatic negative GI ROS, Neg liver ROS,   Endo/Other  negative endocrine ROS  Renal/GU Renal disease  negative genitourinary   Musculoskeletal   Abdominal Normal abdominal exam  (+)   Peds  Hematology negative hematology ROS (+) anemia ,   Anesthesia Other Findings   Reproductive/Obstetrics (+) Pregnancy                           Anesthesia Physical Anesthesia Plan  ASA: II  Anesthesia Plan: Epidural   Post-op Pain Management:    Induction:   Airway Management Planned:   Additional Equipment:   Intra-op Plan:   Post-operative Plan:   Informed Consent: I have reviewed the patients History and Physical, chart, labs and discussed the procedure including the risks, benefits and alternatives for the proposed anesthesia with the patient or authorized representative who has indicated his/her understanding and acceptance.     Plan Discussed with: Anesthesiologist  Anesthesia Plan Comments:         Anesthesia Quick Evaluation

## 2013-03-22 NOTE — Progress Notes (Signed)
US at bedside

## 2013-03-22 NOTE — Progress Notes (Signed)
Report given to [facility] RN L&D. Room assignment 161.

## 2013-03-22 NOTE — Progress Notes (Signed)
Pt tx via [place] with family to room 161.

## 2013-03-22 NOTE — Progress Notes (Signed)
RN unable to locate [facility] on admission to unit.  Called C. Harper RN to assess, unable to locate [place]. Diona Browner RN  house coverage to bedside unable to locate [facility].  No c/o of pain or vaginal bleeding. Pt states she has not felt the baby move as much today. Dr. Ouida Sills notified orders given for u/s. Will continue to monitor.

## 2013-03-22 NOTE — Progress Notes (Signed)
Dr. Ouida Sills notified absent [place]. Orders given to transfer to L&D. MD will see pt on the unit

## 2013-03-22 NOTE — Anesthesia Procedure Notes (Addendum)
Epidural Patient location during procedure: OB Start time: 03/22/2013 4:55 AM  Staffing Anesthesiologist: Lamichael Youkhana A. Performed by: anesthesiologist   Preanesthetic Checklist Completed: patient identified, site marked, surgical consent, pre-op evaluation, timeout performed, IV checked, risks and benefits discussed and monitors and equipment checked  Epidural Patient position: sitting Prep: site prepped and draped and DuraPrep Patient monitoring: continuous pulse ox and blood pressure Approach: midline Injection technique: LOR air  Needle:  Needle type: Tuohy  Needle gauge: 17 G Needle length: 9 cm and 9 Needle insertion depth: 4 cm Catheter type: closed end flexible Catheter size: 19 Gauge Catheter at skin depth: 8 cm Test dose: negative and Other  Assessment Events: blood not aspirated, injection not painful, no injection resistance, negative IV test and no paresthesia  Additional Notes Patient identified. Risks and benefits discussed including failed block, incomplete  Pain control, post dural puncture headache, nerve damage, paralysis, blood pressure Changes, nausea, vomiting, reactions to medications-both toxic and allergic and post Partum back pain. All questions were answered. Patient expressed understanding and wished to proceed. Sterile technique was used throughout procedure. Epidural site was Dressed with sterile barrier dressing. No paresthesias, signs of intravascular injection Or signs of intrathecal spread were encountered.  Patient was more comfortable after the epidural was dosed. Please see RN's note for documentation of vital signs.

## 2013-03-22 NOTE — Progress Notes (Signed)
After admission the nursing service could not hear FHTs. An ultrasound confirmed a fetal demise. I assume that the pt has chorioamnionitis despite having a non tender uterus. Will transfer to L&D for cytotec induction. Will check blood cultures and get a type and screen.

## 2013-03-23 LAB — CBC
Hemoglobin: 6.1 g/dL — CL (ref 12.0–15.0)
MCH: 16.5 pg — ABNORMAL LOW (ref 26.0–34.0)
MCHC: 29.3 g/dL — ABNORMAL LOW (ref 30.0–36.0)

## 2013-03-23 NOTE — Progress Notes (Signed)
PPD#1 Pt without complaints. No F/C. No pain Ut- non tender IMP/ stable PLAN/ will discharge to home and follow up in office this week.

## 2013-03-23 NOTE — Progress Notes (Signed)
Discharge instructions reviewed with patient.  Patient states understanding of home care, medications, activity, signs/symptoms to report to MD and return MD office visit.  No home equipment needed.  Patient ambulated with staff in stable condition for discharge without incident.

## 2013-03-23 NOTE — Anesthesia Postprocedure Evaluation (Signed)
  Anesthesia Post-op Note  Patient: Cindy [name]  Procedure(s) Performed: * No procedures listed *  Patient Location: PACU and Women's Unit  Anesthesia Type:Epidural  Level of Consciousness: awake, alert  and oriented  Airway and Oxygen Therapy: Patient Spontanous Breathing  Post-op Pain: mild  Post-op Assessment: Patient's Cardiovascular Status Stable, Respiratory Function Stable, No signs of Nausea or vomiting, Adequate PO intake and Pain level controlled  Post-op Vital Signs: stable  Complications: No apparent anesthesia complications

## 2013-03-23 NOTE — Anesthesia Postprocedure Evaluation (Signed)
  Anesthesia Post-op Note  Patient: Cindy [name]  Procedure(s) Performed: * No procedures listed *  Patient Location: Women's Unit  Anesthesia Type:Epidural  Level of Consciousness: awake, alert  and oriented  Airway and Oxygen Therapy: Patient Spontanous Breathing  Post-op Pain: none  Post-op Assessment: Post-op Vital signs reviewed, Patient's Cardiovascular Status Stable, Respiratory Function Stable, Patent Airway, No signs of Nausea or vomiting, Pain level controlled, No headache, No backache, No residual numbness and No residual motor weakness  Post-op Vital Signs: Reviewed and stable  Complications: No apparent anesthesia complications

## 2013-03-24 LAB — TYPE AND SCREEN: Unit division: 0

## 2013-03-24 LAB — RUBELLA SCREEN: Rubella: 1.98 Index — ABNORMAL HIGH (ref <0.90)

## 2013-03-24 NOTE — Discharge Summary (Signed)
NAMEMAGDALINA, Cindy [name] NO.:  [id number]  MEDICAL RECORD NO.:  [id number]  LOCATION:  9304                          FACILITY:  [facility]  PHYSICIAN:  Freda Munro, M.D.    DATE OF BIRTH:  [birth year]  DATE OF ADMISSION:  03/21/2013 DATE OF DISCHARGE:  03/23/2013                              DISCHARGE SUMMARY   PRINCIPAL DISCHARGE DIAGNOSES: 1. Intrauterine pregnancy at 22-1/2 weeks estimated gestational age. 2. Fetal demise. 3. Chorioamnionitis.  PRINCIPAL PROCEDURES: 1. Cytotec induction. 2. Fetal ultrasound.  HISTORY OF PRESENT ILLNESS:  Ms. Cindy [name] is a 22 year old, black female, G1, P0, at 22-1/2 weeks estimated gestational age.  She was brought into [facility] via EMS after having fever and chills and shortness of breath.  The patient stated that she had no complications until approximately 6 hours prior to admission when she broke out in sweats. She also said that she was having difficulty catching her breath; however, she had no cough.  She had normal bowel function.  Upon admission, the patient was noted to have temperature elevation and an elevated white count, however, her physical exam was completely benign. The etiology of her fever could not be determined because of a history of multiple UTIs for the last several years.  Presumptive diagnosis was pyelonephritis.  The patient was admitted and begun on Rocephin.  Two hours after admission, fetal heart tones were checked and found to be absent, and ultrasound confirmed fetal demise.  At that point, she was transferred to labor and delivery where a Cytotec induction was begun. The patient had 3 doses of Cytotec and then had a spontaneous delivery of 1 dead fetus in the breech presentation.  Placenta was delivered spontaneously and intact.  On delivery, the fetus appeared to have a very foul odor consistent with chorioamnionitis.  The skin was very friable and fragile.  It appeared that the infant had  been dead for several days.  This brings into question whether the fetal heart rate of 210 on admission was correct.  After delivery, the placenta had aerobic and anaerobic cultures obtained which are still pending.  The patient was offered an autopsy and declined.  In post partum course, the patient had no complaints.  She remained afebrile after delivery.  Initially, she was tachycardic, however, this resolved.  The patient's white count did reach 38,000.  On the day of discharge, it decreased to 29,000.  The patient's exam was completely benign.  She was discharged to home with Motrin, iron, and told to call the office for appointment in 3-4 days.  The patient's hemoglobin was 6.  On admission, it was 7.4.  It was stressed to the patient the need to take her iron.  The patient states that she had been taking her iron throughout the pregnancy.  If her hemoglobin continues to be low despite her taking iron, we will check iron level and TIBC in the office.          ______________________________ Freda Munro, M.D.     MA/MEDQ  D:  03/23/2013  T:  03/24/2013  Job:  [id number]

## 2013-03-25 LAB — URINE CULTURE: Colony Count: >=100000

## 2013-03-26 LAB — WOUND CULTURE: Special Requests: NORMAL

## 2013-03-27 ENCOUNTER — Encounter (HOSPITAL_COMMUNITY): Payer: Self-pay | Admitting: *Deleted

## 2013-03-28 LAB — CULTURE, BLOOD (ROUTINE X 2): Culture: NO GROWTH

## 2013-03-29 LAB — ANAEROBIC CULTURE: Special Requests: NORMAL

## 2013-04-25 ENCOUNTER — Encounter (HOSPITAL_COMMUNITY): Payer: Self-pay | Admitting: Emergency Medicine

## 2013-04-25 ENCOUNTER — Inpatient Hospital Stay (HOSPITAL_COMMUNITY): Payer: Medicaid Other

## 2013-04-25 ENCOUNTER — Inpatient Hospital Stay (HOSPITAL_COMMUNITY)
Admission: EM | Admit: 2013-04-25 | Discharge: 2013-04-26 | DRG: 812 | Disposition: A | Discharge status: Home / Self Care | Payer: Medicaid Other | Attending: Internal Medicine | Admitting: Internal Medicine

## 2013-04-25 DIAGNOSIS — D509 Iron deficiency anemia, unspecified: Secondary | ICD-10-CM | POA: Diagnosis present

## 2013-04-25 DIAGNOSIS — E876 Hypokalemia: Secondary | ICD-10-CM | POA: Diagnosis present

## 2013-04-25 DIAGNOSIS — R0989 Other specified symptoms and signs involving the circulatory and respiratory systems: Secondary | ICD-10-CM

## 2013-04-25 DIAGNOSIS — R0600 Dyspnea, unspecified: Secondary | ICD-10-CM

## 2013-04-25 DIAGNOSIS — D72829 Elevated white blood cell count, unspecified: Secondary | ICD-10-CM | POA: Diagnosis present

## 2013-04-25 DIAGNOSIS — R0609 Other forms of dyspnea: Secondary | ICD-10-CM

## 2013-04-25 DIAGNOSIS — D649 Anemia, unspecified: Secondary | ICD-10-CM

## 2013-04-25 LAB — URINALYSIS, ROUTINE W REFLEX MICROSCOPIC
Bilirubin Urine: NEGATIVE
Glucose, UA: NEGATIVE mg/dL
Hgb urine dipstick: NEGATIVE
Ketones, ur: NEGATIVE mg/dL
Leukocytes, UA: NEGATIVE
Nitrite: NEGATIVE
Protein, ur: NEGATIVE mg/dL
Specific Gravity, Urine: 1.021 (ref 1.005–1.030)
Urobilinogen, UA: 1 mg/dL (ref 0.0–1.0)
pH: 6 (ref 5.0–8.0)

## 2013-04-25 LAB — CBC WITH DIFFERENTIAL/PLATELET
Basophils Absolute: 0 10*3/uL (ref 0.0–0.1)
Basophils Relative: 0 % (ref 0–1)
Eosinophils Absolute: 0 10*3/uL (ref 0.0–0.7)
Eosinophils Relative: 0 % (ref 0–5)
HCT: 18.4 % — ABNORMAL LOW (ref 36.0–46.0)
Hemoglobin: 5 g/dL — CL (ref 12.0–15.0)
Lymphocytes Relative: 31 % (ref 12–46)
Lymphs Abs: 3.8 10*3/uL (ref 0.7–4.0)
MCH: 16 pg — ABNORMAL LOW (ref 26.0–34.0)
MCHC: 27.2 g/dL — ABNORMAL LOW (ref 30.0–36.0)
MCV: 59 fL — ABNORMAL LOW (ref 78.0–100.0)
Monocytes Absolute: 1.1 10*3/uL — ABNORMAL HIGH (ref 0.1–1.0)
Monocytes Relative: 9 % (ref 3–12)
Neutro Abs: 7.2 10*3/uL (ref 1.7–7.7)
Neutrophils Relative %: 60 % (ref 43–77)
Platelets: 430 10*3/uL — ABNORMAL HIGH (ref 150–400)
RBC: 3.12 MIL/uL — ABNORMAL LOW (ref 3.87–5.11)
RDW: 21.2 % — ABNORMAL HIGH (ref 11.5–15.5)
WBC: 12.1 10*3/uL — ABNORMAL HIGH (ref 4.0–10.5)

## 2013-04-25 LAB — PREPARE RBC (CROSSMATCH)

## 2013-04-25 LAB — RETICULOCYTES
RBC.: 3.11 MIL/uL — ABNORMAL LOW (ref 3.87–5.11)
Retic Count, Absolute: 40.4 10*3/uL (ref 19.0–186.0)
Retic Ct Pct: 1.3 % (ref 0.4–3.1)

## 2013-04-25 LAB — BASIC METABOLIC PANEL
BUN: 8 mg/dL (ref 6–23)
CO2: 25 mEq/L (ref 19–32)
Calcium: 8 mg/dL — ABNORMAL LOW (ref 8.4–10.5)
Chloride: 101 mEq/L (ref 96–112)
Creatinine, Ser: 0.47 mg/dL — ABNORMAL LOW (ref 0.50–1.10)
GFR calc Af Amer: >90 mL/min (ref >90)
GFR calc non Af Amer: >90 mL/min (ref >90)
Glucose, Bld: 106 mg/dL — ABNORMAL HIGH (ref 70–99)
Potassium: 2.9 mEq/L — ABNORMAL LOW (ref 3.5–5.1)
Sodium: 136 mEq/L (ref 135–145)

## 2013-04-25 LAB — ABO/RH: ABO/RH(D): A POS

## 2013-04-25 LAB — PREGNANCY, URINE: Preg Test, Ur: NEGATIVE

## 2013-04-25 LAB — MRSA PCR SCREENING: MRSA by PCR: NEGATIVE

## 2013-04-25 MED ORDER — ONDANSETRON HCL 4 MG PO TABS: 4.0000 mg | ORAL_TABLET | Freq: Four times a day (QID) | ORAL | Status: DC | PRN

## 2013-04-25 MED ORDER — SODIUM CHLORIDE 0.9 % IV SOLN
INTRAVENOUS | Status: AC
  Administered 2013-04-25: 21:00:00 via INTRAVENOUS

## 2013-04-25 MED ORDER — ACETAMINOPHEN 650 MG RE SUPP: 650.0000 mg | Freq: Four times a day (QID) | RECTAL | Status: DC | PRN

## 2013-04-25 MED ORDER — SODIUM CHLORIDE 0.9 % IJ SOLN: 3.0000 mL | Freq: Two times a day (BID) | INTRAMUSCULAR | Status: DC

## 2013-04-25 MED ORDER — ACETAMINOPHEN 325 MG PO TABS
ORAL_TABLET | ORAL | Status: AC
  Administered 2013-04-25: 650 mg
  Filled 2013-04-25: qty 2

## 2013-04-25 MED ORDER — FAMOTIDINE 20 MG PO TABS
20.0000 mg | ORAL_TABLET | Freq: Every evening | ORAL | Status: DC | PRN
  Filled 2013-04-25: qty 1

## 2013-04-25 MED ORDER — POTASSIUM CHLORIDE CRYS ER 20 MEQ PO TBCR
60.0000 meq | EXTENDED_RELEASE_TABLET | Freq: Once | ORAL | Status: AC
  Administered 2013-04-25: 60 meq via ORAL
  Filled 2013-04-25: qty 3

## 2013-04-25 MED ORDER — ACETAMINOPHEN 325 MG PO TABS: 650.0000 mg | ORAL_TABLET | Freq: Four times a day (QID) | ORAL | Status: DC | PRN

## 2013-04-25 MED ORDER — ONDANSETRON HCL 4 MG/2ML IJ SOLN: 4.0000 mg | Freq: Four times a day (QID) | INTRAMUSCULAR | Status: DC | PRN

## 2013-04-25 MED ORDER — HYDROCODONE-ACETAMINOPHEN 5-325 MG PO TABS: 1.0000 | ORAL_TABLET | ORAL | Status: DC | PRN

## 2013-04-25 NOTE — ED Notes (Signed)
Verified blood with pt's nurse at bedside only -  prior to pt's rn hanging blood. Lucius Conn BSN, RN-BC Admissions RN  04/25/2013 6:18 PM

## 2013-04-25 NOTE — Progress Notes (Signed)
   CARE MANAGEMENT ED NOTE 04/25/2013  Patient:  Cindy [name], Cindy [name]   Account Number:  [id number]  Date Initiated:  04/25/2013  Documentation initiated by:  Livia Snellen  Subjective/Objective Assessment:     Subjective/Objective Assessment Detail:   As per ED note, patient came to ED from PCP with low H&H.     Action/Plan:   Action/Plan Detail:   Anticipated DC Date:       Status Recommendation to Physician:   Result of Recommendation:    Other ED [place]  Other  PCP issues    Choice offered to / List presented to:            Status of service:  Completed, signed off  ED Comments:   ED Comments Detail:  Patient listed in computer as not having a PCP.  EDCM spoke to patient who stated that her PCP is Dr. Harlan Stains of [facility].  Offered patient encouragement and support.  No further needs at this time.

## 2013-04-25 NOTE — ED Notes (Signed)
Pt here for PCP office  for low h/h Hx of Anemia

## 2013-04-25 NOTE — ED Notes (Signed)
1st unit of PRBC transfusion completed without transfusion reaction. Latest V/S within normal ranges. Pt. Ambulated to restroom without any distress. To receive 2nd unit of PRBC, endorsed to receiving Nurse Sussie,RN on floor.

## 2013-04-25 NOTE — H&P (Addendum)
[facility] History and Physical  Cindy [name] [id number] DOB: 03-12-1991 DOA: 04/25/2013  Referring physician: ER physician PCP: Vidal Schwalbe, MD   Chief Complaint: shortness of breath  HPI:  22 year old female with history of iron deficiency anemia, recent pregnancy that resulted in intrauterine fetal demise and hemoglobin of 6.6 at that time (02/2013) who presented to [facility] ED at the PCP referral for low hemoglobin of 5.6. Patient additionally reported shortness of breath and weakness but no fevers or cough. No complaints of chest pain, no abdominal pain, no nausea or vomiting. No blood in stool or urine. No diarrhea or constipation. In ED, vital signs remain stable, O2 saturation of 100%, BP 126/74 and T = 98.8 F. CBC showed hemoglobin of 5, WBC count of 12.1. BMP revealed potassium of 2.9.  Assessment and Plan:  Principal Problem:   *Symptomatic Anemia - pt with history of iron deficiency anemia - will start 2 units PRBC in ED - follow up CBC in am Active Problems:   Leukocytosis - unclear etiology - follow up CXR - normal; urinalysis negative   Hypokalemia - repleted in ED - follow up BMP in am  Leisa Lenz [facility] [id number]  Review of Systems:  Constitutional: Negative for fever, chills and malaise/fatigue. Negative for diaphoresis.  HENT: Negative for hearing loss, ear pain, nosebleeds, congestion, sore throat, neck pain, tinnitus and ear discharge.  Eyes: Negative for blurred vision, double vision, photophobia, pain, discharge and redness.  Respiratory: Negative for cough, hemoptysis, sputum production, positive for shortness of breath, no wheezing and stridor.   Cardiovascular: Negative for chest pain, palpitations, orthopnea, claudication and leg swelling.  Gastrointestinal: Negative for nausea, vomiting and abdominal pain. Negative for heartburn, constipation, blood in stool and melena.  Genitourinary: Negative for dysuria, urgency, frequency, hematuria and  flank pain.  Musculoskeletal: Negative for myalgias, back pain, joint pain and falls.  Skin: Negative for itching and rash.  Neurological: Negative for dizziness and weakness. Negative for tingling, tremors, sensory change, speech change, focal weakness, loss of consciousness and headaches.  Endo/Heme/Allergies: Negative for environmental allergies and polydipsia. Does not bruise/bleed easily.  Psychiatric/Behavioral: Negative for suicidal ideas. The patient is not nervous/anxious.      Past Medical History  Diagnosis Date  . UTI (lower urinary tract infection)   . Anemia    Past Surgical History  Procedure Laterality Date  . Appendectomy    . Leg reconstruction using fascial flap    . Still born baby  03/22/13    delivered vaginally   Social History:  reports that she has never smoked. She has never used smokeless tobacco. She reports that she does not drink alcohol or use illicit drugs.  No Known Allergies  Family History:  Family History  Problem Relation Age of Onset  . Alcohol abuse Neg Hx   . Arthritis Neg Hx   . Asthma Neg Hx   . Birth defects Neg Hx   . Cancer Neg Hx   . COPD Neg Hx   . Depression Neg Hx   . Diabetes Neg Hx   . Drug abuse Neg Hx   . Early death Neg Hx   . Hearing loss Neg Hx   . Heart disease Neg Hx   . Hyperlipidemia Neg Hx   . Hypertension Neg Hx   . Kidney disease Neg Hx   . Learning disabilities Neg Hx   . Mental illness Neg Hx   . Mental retardation Neg Hx   . Miscarriages / Stillbirths Neg  Hx   . Stroke Neg Hx   . Vision loss Neg Hx      Prior to Admission medications   Medication Sig Start Date End Date Taking? Authorizing Provider  famotidine (PEPCID) 20 MG tablet Take 20 mg by mouth at bedtime as needed for heartburn.   Yes Historical Provider, MD  ferrous gluconate (FERGON) 325 MG tablet Take 325 mg by mouth 2 (two) times daily.   Yes Historical Provider, MD   Physical Exam: Filed Vitals:   04/25/13 1502 04/25/13 1531  BP:  126/74   Pulse: 114   Temp: 99 F (37.2 C)   TempSrc: Oral   Resp: 18   SpO2: 100% 100%    Physical Exam  Constitutional: Appears well-developed and well-nourished. No distress.  HENT: Normocephalic. External right and left ear normal. Oropharynx is clear and moist.  Eyes: Conjunctivae and EOM are normal. PERRLA, no scleral icterus.  Neck: Normal ROM. Neck supple. No JVD. No tracheal deviation. No thyromegaly.  CVS: RRR, S1/S2 +, no murmurs, no gallops, no carotid bruit.  Pulmonary: Effort and breath sounds normal, no stridor, rhonchi, wheezes, rales.  Abdominal: Soft. BS +,  no distension, tenderness, rebound or guarding.  Musculoskeletal: Normal range of motion. No edema and no tenderness.  Lymphadenopathy: No lymphadenopathy noted, cervical, inguinal. Neuro: Alert. Normal reflexes, muscle tone coordination. No cranial nerve deficit. Skin: Skin is warm and dry. No rash noted. Not diaphoretic. No erythema. No pallor.  Psychiatric: Normal mood and affect. Behavior, judgment, thought content normal.   Labs on Admission:  Basic Metabolic Panel:  Recent Labs Lab 04/25/13 1556  NA 136  K 2.9*  CL 101  CO2 25  GLUCOSE 106*  BUN 8  CREATININE 0.47*  CALCIUM 8.0*   Liver Function Tests: No results found for this basename: AST, ALT, ALKPHOS, BILITOT, PROT, ALBUMIN,  in the last 168 hours No results found for this basename: LIPASE, AMYLASE,  in the last 168 hours No results found for this basename: AMMONIA,  in the last 168 hours CBC:  Recent Labs Lab 04/25/13 1556  WBC 12.1*  NEUTROABS 7.2  HGB 5.0*  HCT 18.4*  MCV 59.0*  PLT 430*   Cardiac Enzymes: No results found for this basename: CKTOTAL, CKMB, CKMBINDEX, TROPONINI,  in the last 168 hours BNP: No components found with this basename: POCBNP,  CBG: No results found for this basename: GLUCAP,  in the last 168 hours  Radiological Exams on Admission: No results found.  EKG: Normal sinus rhythm, no ST/T wave  changes  Code Status: Full Family Communication: Pt at bedside Disposition Plan: Admit for further evaluation  Leisa Lenz, MD  [facility] Pager [phone]  If 7PM-7AM, please contact night-coverage [email or web address] Password [facility] 04/25/2013, 6:04 PM

## 2013-04-25 NOTE — ED Provider Notes (Signed)
History    22 year old female presenting with shortness of breath. Patient was evaluated by her PCP and had a CBC with hemoglobin of 5.6 so she was referred to the emergency room. Patient unfortunately had an intrauterine fetal demise and subsequent chorioamnionitis approximately one month ago. She was discharged with a hemoglobin of 6.1. She states she felt SOB then but she related this to her recent illness. She states that it has not improved. She has mild shortness of breath at rest which is exacerbated by even mild activities such as walking across the room. She has a history of anemia, but she's not quite sure of her baseline. She is on ferrous gluconate and reports compliance with this. Has not had a period since her delivery. No BRBPR or melena.  ROS also significant for b/l LE edema which has been persistent since her delivery. Worsens through out day while standing at work. Improved but still significant when she wakes up. No urinary complaints. No HA or visual changes. No fever or chills. Denies any pain.    CSN: [id number]  Arrival date & time 04/25/13  56   First MD Initiated Contact with Patient 04/25/13 1508      No chief complaint on file.   (Consider location/radiation/quality/duration/timing/severity/associated sxs/prior treatment) HPI  Past Medical History  Diagnosis Date  . UTI (lower urinary tract infection)   . Anemia     Past Surgical History  Procedure Laterality Date  . Appendectomy    . Leg reconstruction using fascial flap      Family History  Problem Relation Age of Onset  . Alcohol abuse Neg Hx   . Arthritis Neg Hx   . Asthma Neg Hx   . Birth defects Neg Hx   . Cancer Neg Hx   . COPD Neg Hx   . Depression Neg Hx   . Diabetes Neg Hx   . Drug abuse Neg Hx   . Early death Neg Hx   . Hearing loss Neg Hx   . Heart disease Neg Hx   . Hyperlipidemia Neg Hx   . Hypertension Neg Hx   . Kidney disease Neg Hx   . Learning disabilities Neg Hx   .  Mental illness Neg Hx   . Mental retardation Neg Hx   . Miscarriages / Stillbirths Neg Hx   . Stroke Neg Hx   . Vision loss Neg Hx     History  Substance Use Topics  . Smoking status: Never Smoker   . Smokeless tobacco: Never Used  . Alcohol Use: No    OB History   Grav Para Term Preterm Abortions TAB SAB Ect Mult Living   1 1  1             Review of Systems  All systems reviewed and negative, other than as noted in HPI.   Allergies  Review of patient's allergies indicates no known allergies.  Home Medications   Current Outpatient Rx  Name  Route  Sig  Dispense  Refill  . famotidine (PEPCID) 20 MG tablet   Oral   Take 20 mg by mouth at bedtime as needed for heartburn.         . ferrous gluconate (FERGON) 325 MG tablet   Oral   Take 325 mg by mouth 2 (two) times daily.           BP 126/74  Pulse 114  Temp(Src) 99 F (37.2 C) (Oral)  Resp 18  SpO2 100%  LMP  10/24/2012  Physical Exam  Nursing note and vitals reviewed. Constitutional: She appears well-developed and well-nourished. No distress.  HENT:  Head: Normocephalic and atraumatic.  Eyes: Pupils are equal, round, and reactive to light. Right eye exhibits no discharge. Left eye exhibits no discharge.  Conjunctiva pale  Neck: Neck supple.  Cardiovascular: Regular rhythm and normal heart sounds.  Exam reveals no gallop and no friction rub.   No murmur heard. Tachycardic  Pulmonary/Chest: Effort normal and breath sounds normal. No respiratory distress.  Abdominal: Soft. She exhibits no distension. There is no tenderness.  Musculoskeletal: She exhibits edema. She exhibits no tenderness.  Pitting, symmetric bilateral lower extremity edema  Neurological: She is alert.  Skin: Skin is warm and dry.  Psychiatric: She has a normal mood and affect. Her behavior is normal. Thought content normal.    ED Course  Procedures (including critical care time)  CRITICAL CARE Performed by: Virgel Manifold  Total critical care time: 35 minutes  Critical care time was exclusive of separately billable procedures and treating other patients. Critical care was necessary to treat or prevent imminent or life-threatening deterioration. Critical care was time spent personally by me on the following activities: development of treatment plan with patient and/or surrogate as well as nursing, discussions with consultants, evaluation of patient's response to treatment, examination of patient, obtaining history from patient or surrogate, ordering and performing treatments and interventions, ordering and review of laboratory studies, ordering and review of radiographic studies, pulse oximetry and re-evaluation of patient's condition.    Labs Reviewed  CBC WITH DIFFERENTIAL - Abnormal; Notable for the following:    WBC 12.1 (*)    RBC 3.12 (*)    Hemoglobin 5.0 (*)    HCT 18.4 (*)    MCV 59.0 (*)    MCH 16.0 (*)    MCHC 27.2 (*)    RDW 21.2 (*)    Platelets 430 (*)    All other components within normal limits  BASIC METABOLIC PANEL - Abnormal; Notable for the following:    Potassium 2.9 (*)    Glucose, Bld 106 (*)    Creatinine, Ser 0.47 (*)    Calcium 8.0 (*)    All other components within normal limits  URINALYSIS, ROUTINE W REFLEX MICROSCOPIC - Abnormal; Notable for the following:    APPearance CLOUDY (*)    All other components within normal limits  RETICULOCYTES - Abnormal; Notable for the following:    RBC. 3.11 (*)    All other components within normal limits  PREGNANCY, URINE  VITAMIN B12  FOLATE  IRON AND TIBC  FERRITIN  TYPE AND SCREEN  PREPARE RBC (CROSSMATCH)  ABO/RH   No results found.   1. Anemia   2. Dyspnea   3. Hypokalemia       MDM  22 year old female with symptomatic anemia. Patient's baseline is unclear. Hemoglobin as high as 12.6 barely over a year ago. Etiology not completely clear. Microcytic. She does not describe particulary heavy periods. No BRBPR  or melena. Fe deficiency acutely exacerbated by recent delivery? Pt SOB and sinus tach in 110s at rest. Will transfuse. Admit.        Virgel Manifold, MD 04/25/13 [phone]

## 2013-04-25 NOTE — ED Notes (Signed)
Attempted to call report to floor , receiving Nurse in the middle of procedure, to call back.

## 2013-04-26 LAB — CBC WITH DIFFERENTIAL/PLATELET
Basophils Absolute: 0 10*3/uL (ref 0.0–0.1)
Eosinophils Absolute: 0 10*3/uL (ref 0.0–0.7)
HCT: 25.3 % — ABNORMAL LOW (ref 36.0–46.0)
Lymphs Abs: 4 10*3/uL (ref 0.7–4.0)
MCH: 19.4 pg — ABNORMAL LOW (ref 26.0–34.0)
MCHC: 29.6 g/dL — ABNORMAL LOW (ref 30.0–36.0)
MCV: 65.5 fL — ABNORMAL LOW (ref 78.0–100.0)
Monocytes Absolute: 1.5 10*3/uL — ABNORMAL HIGH (ref 0.1–1.0)
Monocytes Relative: 10 % (ref 3–12)
Neutro Abs: 9.4 10*3/uL — ABNORMAL HIGH (ref 1.7–7.7)
RDW: 26.5 % — ABNORMAL HIGH (ref 11.5–15.5)

## 2013-04-26 LAB — COMPREHENSIVE METABOLIC PANEL
ALT: 16 U/L (ref 0–35)
AST: 39 U/L — ABNORMAL HIGH (ref 0–37)
CO2: 26 mEq/L (ref 19–32)
Chloride: 104 mEq/L (ref 96–112)
GFR calc non Af Amer: >90 mL/min (ref >90)
Glucose, Bld: 108 mg/dL — ABNORMAL HIGH (ref 70–99)
Sodium: 137 mEq/L (ref 135–145)
Total Bilirubin: 0.3 mg/dL (ref 0.3–1.2)

## 2013-04-26 LAB — IRON AND TIBC
Iron: <10 ug/dL — ABNORMAL LOW (ref 42–135)
UIBC: 162 ug/dL (ref 125–400)

## 2013-04-26 LAB — FOLATE: Folate: 6.1 ng/mL

## 2013-04-26 LAB — PROTIME-INR: Prothrombin Time: 13.9 seconds (ref 11.6–15.2)

## 2013-04-26 LAB — GLUCOSE, CAPILLARY: Glucose-Capillary: 94 mg/dL (ref 70–99)

## 2013-04-26 LAB — TSH: TSH: 1.2 u[IU]/mL (ref 0.350–4.500)

## 2013-04-26 LAB — VITAMIN B12: Vitamin B-12: 476 pg/mL (ref 211–911)

## 2013-04-26 LAB — FERRITIN: Ferritin: 33 ng/mL (ref 10–291)

## 2013-04-26 MED ORDER — POTASSIUM CHLORIDE CRYS ER 20 MEQ PO TBCR
40.0000 meq | EXTENDED_RELEASE_TABLET | Freq: Once | ORAL | Status: AC
  Administered 2013-04-26: 40 meq via ORAL
  Filled 2013-04-26: qty 2

## 2013-04-26 NOTE — Discharge Summary (Signed)
Physician Discharge Summary  Cindy [name] [id number] DOB: 06/12/1991 DOA: 04/25/2013  PCP: Vidal Schwalbe, MD  Admit date: 04/25/2013 Discharge date: 04/26/2013  Recommendations for Outpatient Follow-up:  1. Follow up with PCP as soon as possible to recheck hemoglobin  Discharge Diagnoses:  Principal Problem:   Anemia Active Problems:   Leukocytosis   Hypokalemia   Discharge Condition: medically stable for discharge home today  Diet recommendation: as tolerated  History of present illness:  22 year old female with history of iron deficiency anemia, recent pregnancy that resulted in intrauterine fetal demise and hemoglobin of 6.6 at that time (02/2013) who presented to [facility] ED at the PCP referral for low hemoglobin of 5.6. Patient additionally reported shortness of breath and weakness but no fevers or cough. No complaints of chest pain, no abdominal pain, no nausea or vomiting. No blood in stool or urine. No diarrhea or constipation.  In ED, vital signs remain stable, O2 saturation of 100%, BP 126/74 and T = 98.8 F. CBC showed hemoglobin of 5, WBC count of 12.1. BMP revealed potassium of 2.9.   Assessment and Plan:   Principal Problem:  *Symptomatic Anemia  - pt with history of iron deficiency anemia  - received 2 units PRBC in ED  - follow up hemoglobin 7.9  - we will transfuse additional 2 units PRBC prior to discharge Active Problems:  Leukocytosis  - unclear etiology  - follow up CXR - normal; urinalysis negative  Hypokalemia  - repleted in ED and today   Leisa Lenz  [facility]  [id number]   Discharge Exam: Filed Vitals:   04/26/13 1155  BP: 121/76  Pulse: 101  Temp: 98.4 F (36.9 C)  Resp: 14   Filed Vitals:   04/26/13 0915 04/26/13 1015 04/26/13 1110 04/26/13 1155  BP: 127/73 126/71 122/75 121/76  Pulse: 96 98 102 101  Temp: 98.3 F (36.8 C) 98.8 F (37.1 C) 98.2 F (36.8 C) 98.4 F (36.9 C)  TempSrc: Oral Oral Oral Oral  Resp: 18 16 16 14   Height:       Weight:      SpO2:        General: Pt is alert, follows commands appropriately, not in acute distress Cardiovascular: Regular rate and rhythm, S1/S2 +, no murmurs, no rubs, no gallops Respiratory: Clear to auscultation bilaterally, no wheezing, no crackles, no rhonchi Abdominal: Soft, non tender, non distended, bowel sounds +, no guarding Extremities: no edema, no cyanosis, pulses palpable bilaterally DP and PT Neuro: Grossly nonfocal  Discharge Instructions  Discharge Orders   Future Orders Complete By Expires     Call MD for:  difficulty breathing, headache or visual disturbances  As directed     Call MD for:  persistant dizziness or light-headedness  As directed     Call MD for:  persistant nausea and vomiting  As directed     Call MD for:  severe uncontrolled pain  As directed     Diet - low sodium heart healthy  As directed     Discharge instructions  As directed     Comments:      Please follow up with PCP in next few days after discharge to recheck CBC and ensure hemoglobin stability. You have received 4 units of blood on this admission    Increase activity slowly  As directed         Medication List    TAKE these medications       famotidine 20 MG tablet  Commonly  known as:  PEPCID  Take 20 mg by mouth at bedtime as needed for heartburn.     ferrous gluconate 325 MG tablet  Commonly known as:  FERGON  Take 325 mg by mouth 2 (two) times daily.           Follow-up Information   Schedule an appointment as soon as possible for a visit with Vidal Schwalbe, MD.   Contact information:   [address] [address] [phone]        The results of significant diagnostics from this hospitalization (including imaging, microbiology, ancillary and laboratory) are listed below for reference.    Significant Diagnostic Studies: Dg Chest Port 1 View  04/25/2013   *RADIOLOGY REPORT*  Clinical Data: Shortness of breath for 1 month.  History of  anemia. Nonsmoker.  PORTABLE CHEST - 1 VIEW  Comparison: 11/12/2011  Findings: Midline trachea.  Normal heart size for level of inspiration.  No pleural effusion or pneumothorax.  Clear lungs.  IMPRESSION: Normal chest.   Original Report Authenticated By: Abigail Miyamoto, M.D.    Microbiology: Recent Results (from the past 240 hour(s))  MRSA PCR SCREENING     Status: None   Collection Time    04/25/13  9:07 PM      Result Value Range Status   MRSA by PCR NEGATIVE  NEGATIVE Final   Comment:            The GeneXpert MRSA Assay (FDA     approved for NASAL specimens     only), is one component of a     comprehensive MRSA colonization     surveillance program. It is not     intended to diagnose MRSA     infection nor to guide or     monitor treatment for     MRSA infections.     Labs: Basic Metabolic Panel:  Recent Labs Lab 04/25/13 1556 04/26/13 0340  NA 136 137  K 2.9* 3.3*  CL 101 104  CO2 25 26  GLUCOSE 106* 108*  BUN 8 8  CREATININE 0.47* 0.48*  CALCIUM 8.0* 8.1*  MG  --  1.8  PHOS  --  2.9   Liver Function Tests:  Recent Labs Lab 04/26/13 0340  AST 39*  ALT 16  ALKPHOS 125*  BILITOT 0.3  PROT 5.9*  ALBUMIN 1.6*   No results found for this basename: LIPASE, AMYLASE,  in the last 168 hours No results found for this basename: AMMONIA,  in the last 168 hours CBC:  Recent Labs Lab 04/25/13 1556 04/26/13 0340  WBC 12.1* 14.9*  NEUTROABS 7.2 9.4*  HGB 5.0* 7.5*  HCT 18.4* 25.3*  MCV 59.0* 65.5*  PLT 430* 353   Cardiac Enzymes: No results found for this basename: CKTOTAL, CKMB, CKMBINDEX, TROPONINI,  in the last 168 hours BNP: BNP (last 3 results) No results found for this basename: PROBNP,  in the last 8760 hours CBG:  Recent Labs Lab 04/26/13 0723  GLUCAP 94    Time coordinating discharge: Over 30 minutes  Signed:  Leisa Lenz, MD  [place]  04/26/2013, 12:23 PM  Pager #: [phone]

## 2013-04-27 LAB — TYPE AND SCREEN
ABO/RH(D): A POS
Antibody Screen: NEGATIVE
Unit division: 0
Unit division: 0
Unit division: 0
Unit division: 0

## 2013-05-20 ENCOUNTER — Emergency Department (HOSPITAL_COMMUNITY)
Admission: EM | Admit: 2013-05-20 | Discharge: 2013-05-20 | Disposition: A | Discharge status: Other Acute Inpatient Hospital | Payer: Medicaid Other | Source: Home / Self Care | Attending: Family Medicine | Admitting: Family Medicine

## 2013-05-20 ENCOUNTER — Encounter (HOSPITAL_COMMUNITY): Payer: Self-pay

## 2013-05-20 ENCOUNTER — Encounter (HOSPITAL_COMMUNITY): Payer: Self-pay | Admitting: *Deleted

## 2013-05-20 ENCOUNTER — Inpatient Hospital Stay (HOSPITAL_COMMUNITY)
Admission: EM | Admit: 2013-05-20 | Discharge: 2013-05-27 | DRG: 374 | Disposition: A | Discharge status: Home / Self Care | Payer: Medicaid Other | Attending: Internal Medicine | Admitting: Internal Medicine

## 2013-05-20 DIAGNOSIS — D72829 Elevated white blood cell count, unspecified: Secondary | ICD-10-CM | POA: Diagnosis present

## 2013-05-20 DIAGNOSIS — R1903 Right lower quadrant abdominal swelling, mass and lump: Secondary | ICD-10-CM

## 2013-05-20 DIAGNOSIS — C189 Malignant neoplasm of colon, unspecified: Secondary | ICD-10-CM | POA: Diagnosis present

## 2013-05-20 DIAGNOSIS — Z801 Family history of malignant neoplasm of trachea, bronchus and lung: Secondary | ICD-10-CM

## 2013-05-20 DIAGNOSIS — K635 Polyp of colon: Secondary | ICD-10-CM

## 2013-05-20 DIAGNOSIS — D129 Benign neoplasm of anus and anal canal: Secondary | ICD-10-CM | POA: Diagnosis present

## 2013-05-20 DIAGNOSIS — R6510 Systemic inflammatory response syndrome (SIRS) of non-infectious origin without acute organ dysfunction: Secondary | ICD-10-CM

## 2013-05-20 DIAGNOSIS — R0981 Nasal congestion: Secondary | ICD-10-CM | POA: Diagnosis not present

## 2013-05-20 DIAGNOSIS — C182 Malignant neoplasm of ascending colon: Principal | ICD-10-CM | POA: Diagnosis present

## 2013-05-20 DIAGNOSIS — Z9089 Acquired absence of other organs: Secondary | ICD-10-CM

## 2013-05-20 DIAGNOSIS — D128 Benign neoplasm of rectum: Secondary | ICD-10-CM | POA: Diagnosis present

## 2013-05-20 DIAGNOSIS — J189 Pneumonia, unspecified organism: Secondary | ICD-10-CM | POA: Diagnosis present

## 2013-05-20 DIAGNOSIS — Z79899 Other long term (current) drug therapy: Secondary | ICD-10-CM

## 2013-05-20 DIAGNOSIS — R112 Nausea with vomiting, unspecified: Secondary | ICD-10-CM | POA: Diagnosis present

## 2013-05-20 DIAGNOSIS — D509 Iron deficiency anemia, unspecified: Secondary | ICD-10-CM | POA: Diagnosis present

## 2013-05-20 DIAGNOSIS — D649 Anemia, unspecified: Secondary | ICD-10-CM

## 2013-05-20 DIAGNOSIS — K6389 Other specified diseases of intestine: Secondary | ICD-10-CM | POA: Diagnosis present

## 2013-05-20 DIAGNOSIS — E876 Hypokalemia: Secondary | ICD-10-CM

## 2013-05-20 HISTORY — DX: Iron deficiency anemia, unspecified: D50.9

## 2013-05-20 HISTORY — DX: Cardiac murmur, unspecified: R01.1

## 2013-05-20 HISTORY — DX: Adverse effect of unspecified anesthetic, initial encounter: T41.45XA

## 2013-05-20 HISTORY — DX: Other complications of anesthesia, initial encounter: T88.59XA

## 2013-05-20 LAB — COMPREHENSIVE METABOLIC PANEL
ALT: 9 U/L (ref 0–35)
AST: 10 U/L (ref 0–37)
Albumin: 2.3 g/dL — ABNORMAL LOW (ref 3.5–5.2)
CO2: 28 mEq/L (ref 19–32)
Calcium: 9.2 mg/dL (ref 8.4–10.5)
Creatinine, Ser: 0.53 mg/dL (ref 0.50–1.10)
Sodium: 139 mEq/L (ref 135–145)
Total Protein: 7.6 g/dL (ref 6.0–8.3)

## 2013-05-20 LAB — CBC WITH DIFFERENTIAL/PLATELET
Basophils Absolute: 0 10*3/uL (ref 0.0–0.1)
Eosinophils Absolute: 0 10*3/uL (ref 0.0–0.7)
Eosinophils Relative: 0 % (ref 0–5)
MCH: 21 pg — ABNORMAL LOW (ref 26.0–34.0)
Monocytes Absolute: 1.8 10*3/uL — ABNORMAL HIGH (ref 0.1–1.0)
Neutrophils Relative %: 68 % (ref 43–77)
Platelets: 579 10*3/uL — ABNORMAL HIGH (ref 150–400)
RBC: 3.95 MIL/uL (ref 3.87–5.11)
RDW: 24.8 % — ABNORMAL HIGH (ref 11.5–15.5)
WBC: 19.6 10*3/uL — ABNORMAL HIGH (ref 4.0–10.5)

## 2013-05-20 LAB — POCT URINALYSIS DIP (DEVICE)
Glucose, UA: NEGATIVE mg/dL
Nitrite: NEGATIVE
Protein, ur: 30 mg/dL — AB
Urobilinogen, UA: 0.2 mg/dL (ref 0.0–1.0)

## 2013-05-20 NOTE — ED Notes (Signed)
Pt c/o RLQ pain w/palpation, vomiting, and sore throat x1 day

## 2013-05-20 NOTE — ED Notes (Signed)
Pt  Reports  Symptoms  Of  Vomiting    X   2  Today  -  3  Times  Yesterday      - she  Reports  As well  intermittant  abd  Pain       -   Denys  Any     Diarrhea    -  She  Reports  Able  To       Take  Po  Fluids

## 2013-05-20 NOTE — ED Notes (Signed)
Pt reports she gave birth to a still born daughter March 22, 2013

## 2013-05-20 NOTE — ED Notes (Signed)
Pt  Had   Still  Birth    sev  Months  Ago

## 2013-05-20 NOTE — ED Notes (Signed)
Pt sent here from [facility], they collected a urine sample on her there

## 2013-05-20 NOTE — ED Provider Notes (Signed)
History    CSN: [id number] Arrival date & time 05/20/13  1850  First MD Initiated Contact with Patient 05/20/13 1858     Chief Complaint  Patient presents with  . Emesis   (Consider location/radiation/quality/duration/timing/severity/associated sxs/prior Treatment) Patient is a 22 y.o. female presenting with vomiting. The history is provided by the patient.  Emesis Severity:  Moderate Duration:  1 day Timing:  Intermittent Quality:  Stomach contents Progression:  Unchanged Chronicity:  New Recent urination:  Normal Associated symptoms: abdominal pain   Associated symptoms: no diarrhea and no fever   Risk factors: not pregnant now, no sick contacts, no suspect food intake and no travel to endemic areas   Risk factors comment:  S/p stillborn in april, no vag bleeding or d/c  Past Medical History  Diagnosis Date  . UTI (lower urinary tract infection)   . Heart murmur   . Pneumonia 2008; 2012  . Complication of anesthesia     "I swallowed the anesthesia when appendix taken out; got pneumonia"  . Iron deficiency anemia   . History of blood transfusion 04/26/2013    "4 bags" (05/21/2013)  . Cancer     Colon   Past Surgical History  Procedure Laterality Date  . Leg reconstruction using fascial flap  ~ 2009  . Still born baby  03/22/13    delivered vaginally @ ~ 24 weeks  . Appendectomy  2008  . Colonoscopy N/A 05/24/2013    Procedure: COLONOSCOPY;  Surgeon: Missy Sabins, MD;  Location: [facility];  Service: Endoscopy;  Laterality: N/A;   Family History  Problem Relation Age of Onset  . Alcohol abuse Neg Hx   . Arthritis Neg Hx   . Asthma Neg Hx   . Birth defects Neg Hx   . Cancer Neg Hx   . COPD Neg Hx   . Depression Neg Hx   . Diabetes Neg Hx   . Drug abuse Neg Hx   . Early death Neg Hx   . Hearing loss Neg Hx   . Heart disease Neg Hx   . Hyperlipidemia Neg Hx   . Hypertension Neg Hx   . Kidney disease Neg Hx   . Learning disabilities Neg Hx   . Mental  illness Neg Hx   . Mental retardation Neg Hx   . Miscarriages / Stillbirths Neg Hx   . Stroke Neg Hx   . Vision loss Neg Hx   . ALS Mother    History  Substance Use Topics  . Smoking status: Never Smoker   . Smokeless tobacco: Never Used  . Alcohol Use: No   OB History   Grav Para Term Preterm Abortions TAB SAB Ect Mult Living   1 1  1            Review of Systems  Constitutional: Negative.   Gastrointestinal: Positive for vomiting and abdominal pain. Negative for diarrhea and constipation.  Genitourinary: Negative for dysuria, frequency, hematuria, vaginal bleeding, vaginal discharge and vaginal pain.  Musculoskeletal: Negative.     Allergies  Review of patient's allergies indicates no known allergies.  Home Medications   Current Outpatient Rx  Name  Route  Sig  Dispense  Refill  . acetaminophen (TYLENOL) 325 MG tablet   Oral   Take 2 tablets (650 mg total) by mouth every 6 (six) hours as needed for pain or fever.         . diphenhydrAMINE (BENADRYL) 50 MG capsule   Oral   Take  1 capsule (50 mg total) by mouth every 6 (six) hours as needed for allergies.   30 capsule   0   . ferrous sulfate 325 (65 FE) MG tablet   Oral   Take 325 mg by mouth 3 (three) times daily with meals.         . fluticasone (FLONASE) 50 MCG/ACT nasal spray   Nasal   Place 2 sprays into the nose daily.      2   . guaiFENesin (MUCINEX) 600 MG 12 hr tablet   Oral   Take 1 tablet (600 mg total) by mouth 2 (two) times daily as needed for congestion.         Marland Kitchen HYDROcodone-acetaminophen (NORCO/VICODIN) 5-325 MG per tablet   Oral   Take 1 tablet by mouth every 6 (six) hours as needed for pain.   10 tablet   0   . levofloxacin (LEVAQUIN) 500 MG tablet   Oral   Take 1 tablet (500 mg total) by mouth daily.   6 tablet   0   . promethazine (PHENERGAN) 12.5 MG tablet   Oral   Take 1 tablet (12.5 mg total) by mouth every 6 (six) hours as needed for nausea.   10 tablet   0    BP  107/68  Pulse 122  Temp(Src) 98.6 F (37 C) (Oral)  Resp 16  SpO2 100%  LMP 10/24/2012 Physical Exam  Nursing note and vitals reviewed. Constitutional: She is oriented to person, place, and time. She appears well-developed and well-nourished.  Abdominal: Soft. Bowel sounds are normal. She exhibits mass. She exhibits no distension. There is no hepatosplenomegaly. There is no tenderness. There is no rebound, no guarding and no CVA tenderness.    Neurological: She is alert and oriented to person, place, and time.  Skin: Skin is warm and dry.    ED Course  Procedures (including critical care time) Labs Reviewed  POCT URINALYSIS DIP (DEVICE) - Abnormal; Notable for the following:    Hgb urine dipstick SMALL (*)    Protein, ur 30 (*)    Leukocytes, UA TRACE (*)    All other components within normal limits  POCT PREGNANCY, URINE   No results found. 1. Abdominal mass, RLQ (right lower quadrant)     MDM  Sent for ct eval of right sided abd mass with assoc n/v, nl bm , no fever.  Billy Fischer, MD 06/03/13 [phone]

## 2013-05-21 ENCOUNTER — Inpatient Hospital Stay (HOSPITAL_COMMUNITY): Payer: Medicaid Other

## 2013-05-21 ENCOUNTER — Emergency Department (HOSPITAL_COMMUNITY): Payer: Medicaid Other

## 2013-05-21 ENCOUNTER — Encounter (HOSPITAL_COMMUNITY): Payer: Self-pay

## 2013-05-21 DIAGNOSIS — D490 Neoplasm of unspecified behavior of digestive system: Secondary | ICD-10-CM

## 2013-05-21 DIAGNOSIS — K6389 Other specified diseases of intestine: Secondary | ICD-10-CM

## 2013-05-21 DIAGNOSIS — R112 Nausea with vomiting, unspecified: Secondary | ICD-10-CM | POA: Diagnosis present

## 2013-05-21 DIAGNOSIS — D509 Iron deficiency anemia, unspecified: Secondary | ICD-10-CM

## 2013-05-21 DIAGNOSIS — D72829 Elevated white blood cell count, unspecified: Secondary | ICD-10-CM

## 2013-05-21 LAB — RETICULOCYTES
RBC.: 3.72 MIL/uL — ABNORMAL LOW (ref 3.87–5.11)
Retic Count, Absolute: 33.5 10*3/uL (ref 19.0–186.0)
Retic Ct Pct: 0.9 % (ref 0.4–3.1)

## 2013-05-21 LAB — LACTIC ACID, PLASMA: Lactic Acid, Venous: 0.6 mmol/L (ref 0.5–2.2)

## 2013-05-21 LAB — CBC WITH DIFFERENTIAL/PLATELET
Basophils Absolute: 0 10*3/uL (ref 0.0–0.1)
Eosinophils Absolute: 0.1 10*3/uL (ref 0.0–0.7)
Hemoglobin: 7.8 g/dL — ABNORMAL LOW (ref 12.0–15.0)
Lymphocytes Relative: 17 % (ref 12–46)
MCHC: 29.8 g/dL — ABNORMAL LOW (ref 30.0–36.0)
Monocytes Relative: 8 % (ref 3–12)
Neutrophils Relative %: 74 % (ref 43–77)
RDW: 24.7 % — ABNORMAL HIGH (ref 11.5–15.5)
WBC: 14.5 10*3/uL — ABNORMAL HIGH (ref 4.0–10.5)

## 2013-05-21 LAB — LACTATE DEHYDROGENASE: LDH: 177 U/L (ref 94–250)

## 2013-05-21 LAB — FERRITIN: Ferritin: 283 ng/mL (ref 10–291)

## 2013-05-21 LAB — POCT I-STAT TROPONIN I: Troponin i, poc: 0.01 ng/mL (ref 0.00–0.08)

## 2013-05-21 LAB — POCT PREGNANCY, URINE: Preg Test, Ur: NEGATIVE

## 2013-05-21 MED ORDER — ONDANSETRON HCL 4 MG/2ML IJ SOLN
4.0000 mg | Freq: Three times a day (TID) | INTRAMUSCULAR | Status: DC | PRN
  Administered 2013-05-21: 4 mg via INTRAVENOUS
  Filled 2013-05-21: qty 2

## 2013-05-21 MED ORDER — SODIUM CHLORIDE 0.9 % IV SOLN: INTRAVENOUS | Status: DC

## 2013-05-21 MED ORDER — IOHEXOL 300 MG/ML  SOLN
25.0000 mL | INTRAMUSCULAR | Status: AC
  Administered 2013-05-21: 25 mL via ORAL

## 2013-05-21 MED ORDER — PROMETHAZINE HCL 25 MG/ML IJ SOLN: 12.5000 mg | Freq: Once | INTRAMUSCULAR | Status: DC

## 2013-05-21 MED ORDER — PIPERACILLIN-TAZOBACTAM 3.375 G IVPB 30 MIN
3.3750 g | Freq: Once | INTRAVENOUS | Status: AC
  Administered 2013-05-21: 3.375 g via INTRAVENOUS
  Filled 2013-05-21: qty 50

## 2013-05-21 MED ORDER — SODIUM CHLORIDE 0.9 % IV BOLUS (SEPSIS)
1000.0000 mL | Freq: Once | INTRAVENOUS | Status: AC
  Administered 2013-05-21: 1000 mL via INTRAVENOUS

## 2013-05-21 MED ORDER — IOHEXOL 300 MG/ML  SOLN
100.0000 mL | Freq: Once | INTRAMUSCULAR | Status: AC | PRN
  Administered 2013-05-21: 70 mL via INTRAVENOUS

## 2013-05-21 MED ORDER — ONDANSETRON HCL 4 MG/2ML IJ SOLN
4.0000 mg | Freq: Four times a day (QID) | INTRAMUSCULAR | Status: DC | PRN
  Administered 2013-05-21: 4 mg via INTRAVENOUS
  Filled 2013-05-21: qty 2

## 2013-05-21 MED ORDER — DEXTROSE-NACL 5-0.45 % IV SOLN
INTRAVENOUS | Status: AC
  Administered 2013-05-21: 10:00:00 via INTRAVENOUS

## 2013-05-21 MED ORDER — PEG 3350-KCL-NA BICARB-NACL 420 G PO SOLR
4000.0000 mL | Freq: Once | ORAL | Status: AC
  Administered 2013-05-21: 4000 mL via ORAL
  Filled 2013-05-21: qty 4000

## 2013-05-21 MED ORDER — OXYCODONE-ACETAMINOPHEN 5-325 MG PO TABS: 1.0000 | ORAL_TABLET | Freq: Once | ORAL | Status: DC

## 2013-05-21 MED ORDER — ONDANSETRON HCL 4 MG/2ML IJ SOLN
4.0000 mg | Freq: Once | INTRAMUSCULAR | Status: AC
  Administered 2013-05-21: 4 mg via INTRAVENOUS
  Filled 2013-05-21: qty 2

## 2013-05-21 MED ORDER — ACETAMINOPHEN 325 MG PO TABS
650.0000 mg | ORAL_TABLET | Freq: Four times a day (QID) | ORAL | Status: DC | PRN
  Filled 2013-05-21: qty 2

## 2013-05-21 MED ORDER — HYDROMORPHONE HCL PF 1 MG/ML IJ SOLN
1.0000 mg | INTRAMUSCULAR | Status: DC | PRN
  Administered 2013-05-21: 1 mg via INTRAVENOUS
  Filled 2013-05-21: qty 1

## 2013-05-21 MED ORDER — POTASSIUM CHLORIDE IN NACL 20-0.9 MEQ/L-% IV SOLN
INTRAVENOUS | Status: DC
  Administered 2013-05-21: 15:00:00 via INTRAVENOUS
  Filled 2013-05-21 (×4): qty 1000

## 2013-05-21 MED ORDER — ZOLPIDEM TARTRATE 5 MG PO TABS: 5.0000 mg | ORAL_TABLET | Freq: Every evening | ORAL | Status: DC | PRN

## 2013-05-21 MED ORDER — MORPHINE SULFATE 4 MG/ML IJ SOLN
4.0000 mg | Freq: Once | INTRAMUSCULAR | Status: AC
Start: 2013-05-21 — End: 2013-05-21
  Administered 2013-05-21: 4 mg via INTRAVENOUS
  Filled 2013-05-21: qty 1

## 2013-05-21 NOTE — ED Provider Notes (Signed)
History    CSN: [id number] Arrival date & time 05/20/13  [id number]  First MD Initiated Contact with Patient 05/20/13 2326     Chief Complaint  Patient presents with  . Abdominal Pain   (Consider location/radiation/quality/duration/timing/severity/associated sxs/prior Treatment) HPI Patient is a generally healthy 22 yo woman who was referred to the ED from local urgent care center for evaluation of an abdominal mass. The patient presented to UC for evaluation of 2d of nausea and vomiting along with sore throat. She admitted to abdominal pain in the ED but says that this was not among her complaints when she presented to UC.   The patient denies unusual vaginal discharge, dysuria. She is s/p IUFD at approximately 24 wks on 03/22/13.  She endorses daily night sweats since delivery and says she has lost weight and her appetite has been poor. Her pre-pregnancy weight was 125lb and she says she weighed 105lb today.   She denies diarrhea, constipation, bloody stools.  Past Medical History  Diagnosis Date  . UTI (lower urinary tract infection)   . Anemia    Past Surgical History  Procedure Laterality Date  . Appendectomy    . Leg reconstruction using fascial flap    . Still born baby  03/22/13    delivered vaginally   Family History  Problem Relation Age of Onset  . Alcohol abuse Neg Hx   . Arthritis Neg Hx   . Asthma Neg Hx   . Birth defects Neg Hx   . Cancer Neg Hx   . COPD Neg Hx   . Depression Neg Hx   . Diabetes Neg Hx   . Drug abuse Neg Hx   . Early death Neg Hx   . Hearing loss Neg Hx   . Heart disease Neg Hx   . Hyperlipidemia Neg Hx   . Hypertension Neg Hx   . Kidney disease Neg Hx   . Learning disabilities Neg Hx   . Mental illness Neg Hx   . Mental retardation Neg Hx   . Miscarriages / Stillbirths Neg Hx   . Stroke Neg Hx   . Vision loss Neg Hx    History  Substance Use Topics  . Smoking status: Never Smoker   . Smokeless tobacco: Never Used  . Alcohol Use: No    OB History   Grav Para Term Preterm Abortions TAB SAB Ect Mult Living   1 1  1            Review of Systems Gen: no weight loss, fevers, chills, night sweats Eyes: no discharge or drainage, no occular pain or visual changes Nose: no epistaxis or rhinorrhea Mouth: no dental pain, no sore throat Neck: no neck pain Lungs: no SOB, cough, wheezing CV: no chest pain, palpitations, dependent edema or orthopnea Abd: no abdominal pain, nausea, vomiting GU: no dysuria or gross hematuria MSK: no myalgias or arthralgias Neuro: no headache, no focal neurologic deficits Skin: no rash Psyche: negative.  Allergies  Review of patient's allergies indicates no known allergies.  Home Medications   Current Outpatient Rx  Name  Route  Sig  Dispense  Refill  . ferrous sulfate 325 (65 FE) MG tablet   Oral   Take 325 mg by mouth 2 (two) times daily.          BP 122/77  Pulse 109  Temp(Src) 99.1 F (37.3 C) (Oral)  Resp 18  Ht 5\' 1"  (1.549 m)  Wt 106 lb (48.081 kg)  BMI 20.04  kg/m2  SpO2 98%  LMP 10/24/2012 Physical Exam Gen: well developed and thin, does not appear in distress Head: NCAT Eyes: PERL, EOMI Nose: no epistaixis or rhinorrhea Mouth/throat: mucosa is moist and pink Neck: supple, no stridor, no adenopathy Lungs: CTA B, no wheezing, rhonchi or rales CV: rapid and regular, rate 108 Abd: soft, nondistended, large mass palpated in rlq which extends to cross the midline, mass is mildly tender.no rebound or gaurding.  Back: no ttp, no cva ttp Skin: no rashese, wnl Neuro: CN ii-xii grossly intact, no focal deficits Psyche; normal affect,  calm and cooperative.   ED Course  Procedures (including critical care time) Results for orders placed during the hospital encounter of 05/20/13 (from the past 24 hour(s))  CBC WITH DIFFERENTIAL     Status: Abnormal   Collection Time    05/20/13  8:54 PM      Result Value Range   WBC 19.6 (*) 4.0 - 10.5 K/uL   RBC 3.95  3.87 - 5.11  MIL/uL   Hemoglobin 8.3 (*) 12.0 - 15.0 g/dL   HCT 27.4 (*) 36.0 - 46.0 %   MCV 69.4 (*) 78.0 - 100.0 fL   MCH 21.0 (*) 26.0 - 34.0 pg   MCHC 30.3  30.0 - 36.0 g/dL   RDW 24.8 (*) 11.5 - 15.5 %   Platelets 579 (*) 150 - 400 K/uL   Neutrophils Relative % 68  43 - 77 %   Lymphocytes Relative 23  12 - 46 %   Monocytes Relative 9  3 - 12 %   Eosinophils Relative 0  0 - 5 %   Basophils Relative 0  0 - 1 %   Neutro Abs 13.3 (*) 1.7 - 7.7 K/uL   Lymphs Abs 4.5 (*) 0.7 - 4.0 K/uL   Monocytes Absolute 1.8 (*) 0.1 - 1.0 K/uL   Eosinophils Absolute 0.0  0.0 - 0.7 K/uL   Basophils Absolute 0.0  0.0 - 0.1 K/uL   RBC Morphology POLYCHROMASIA PRESENT    COMPREHENSIVE METABOLIC PANEL     Status: Abnormal   Collection Time    05/20/13  8:54 PM      Result Value Range   Sodium 139  135 - 145 mEq/L   Potassium 3.8  3.5 - 5.1 mEq/L   Chloride 101  96 - 112 mEq/L   CO2 28  19 - 32 mEq/L   Glucose, Bld 99  70 - 99 mg/dL   BUN 8  6 - 23 mg/dL   Creatinine, Ser 0.53  0.50 - 1.10 mg/dL   Calcium 9.2  8.4 - 10.5 mg/dL   Total Protein 7.6  6.0 - 8.3 g/dL   Albumin 2.3 (*) 3.5 - 5.2 g/dL   AST 10  0 - 37 U/L   ALT 9  0 - 35 U/L   Alkaline Phosphatase 133 (*) 39 - 117 U/L   Total Bilirubin 0.4  0.3 - 1.2 mg/dL   GFR calc non Af Amer >90  >90 mL/min   GFR calc Af Amer >90  >90 mL/min  LIPASE, BLOOD     Status: None   Collection Time    05/20/13  8:54 PM      Result Value Range   Lipase 11  11 - 59 U/L  POCT PREGNANCY, URINE     Status: None   Collection Time    05/21/13 12:11 AM      Result Value Range   Preg Test, Ur NEGATIVE  NEGATIVE  LACTIC ACID, PLASMA     Status: None   Collection Time    05/21/13  1:54 AM      Result Value Range   Lactic Acid, Venous 0.6  0.5 - 2.2 mmol/L  POCT I-STAT TROPONIN I     Status: None   Collection Time    05/21/13  4:56 AM      Result Value Range   Troponin i, poc 0.01  0.00 - 0.08 ng/mL   Comment 3             CT ABDOMEN AND PELVIS WITH  CONTRAST  Technique: Multidetector CT imaging of the abdomen and pelvis was performed following the standard protocol during bolus administration of intravenous contrast.  Contrast: 38mL OMNIPAQUE IOHEXOL 300 MG/ML SOLN  Comparison: 11/09/2011  Findings: Heart is borderline in size. Minimal linear ground-glass opacities in the left base, presumably atelectasis. Right lung base clear. No effusions.  Liver, gallbladder, spleen, pancreas, adrenals are unremarkable. Small bilateral nonobstructing renal stones. Mild fullness of the right renal pelvis appears similar to prior study.  There is marked irregular wall thickening and mass involving the ascending colon and hepatic flexure. Contrast is seen within the lumen of the colon at this level, at some places the lumen is mildly prominent and dilated. Colonic wall measures as thick as 3.5 cm in places. No evidence of bowel obstruction. Mild wall thickening extends into the transverse colon. There appears to be a large polypoid mass within the distal transverse colon on a stalk. The polypoid mass measures maximally 4.0 cm on coronal image 47. While infection/colitis could have this appearance, the constellation of findings is concerning for possible neoplasm. Lymphoma not excluded. There are enlarged adjacent mesenteric lymph nodes. Small bowel is decompressed.  Trace through the tip in the pelvis. Uterus, adnexa urinary bladder are unremarkable. No acute bony abnormality.  IMPRESSION: Large irregular mass involving the ascending colon and hepatic flexure of the colon. This has a cobblestone appearance. The lumen in areas is dilated. There is also a 4 cm mass within the distal transverse colon on a stalk. Constellation of findings concerning for malignancy such as lymphoma. There are adjacent enlarged mesenteric lymph nodes.  Bilateral nephrolithiasis. Slight fullness of the right renal pelvis could be related to compression from  the large colonic mass or mild chronic UPJ obstruction.  These results were called to Dr. Cheri Guppy at the time of interpretation.    MDM  Patient with large intracolonic mass and bowel wall thickening which is suspicious for lymphoma per Radiology report. Results conveyed to the patient. I am requesting general surgery consultation.  Receved phone call back from [place].  Dr. Hulen Skains suggests that the patient might be best served on Medicine Service but, will send a colleague down to evaluate the patient in the ED. Will page medicine to admit.   Elyn Peers, MD 05/21/13 [phone]

## 2013-05-21 NOTE — ED Notes (Signed)
CT notified pt is finished with contrast.

## 2013-05-21 NOTE — Consult Note (Signed)
Subjective:   HPI  The patient is a 22 year old female who went to the urgent care yesterday thinking that she had a cold. Abdominal examination was done there and no mass was felt. She was sent to the hospital and had a CT scan of the abdomen done which shows a large mass in the right colon of uncertain etiology. The patient states that she has been having normal bowel movements. She denies rectal bleeding. She has not been losing weight. Also found to be markedly anemic and  Review of Systems No chest pain or shortness of breath. No rectal bleeding. No change in bowel habits. No weight loss.  Past Medical History  Diagnosis Date  . UTI (lower urinary tract infection)   . Anemia   . Blood transfusion without reported diagnosis    Past Surgical History  Procedure Laterality Date  . Appendectomy    . Leg reconstruction using fascial flap    . Still born baby  03/22/13    delivered vaginally   History   Social History  . Marital Status: Single    Spouse Name: N/A    Number of Children: N/A  . Years of Education: N/A   Occupational History  . Not on file.   Social History Main Topics  . Smoking status: Never Smoker   . Smokeless tobacco: Never Used  . Alcohol Use: No  . Drug Use: No  . Sexually Active: Yes    Birth Control/ Protection: Condom     Comment: pregnancy   Other Topics Concern  . Not on file   Social History Narrative  . No narrative on file   family history includes ALS in her mother.  There is no history of Alcohol abuse, and Arthritis, and Asthma, and Birth defects, and Cancer, and COPD, and Depression, and Diabetes, and Drug abuse, and Early death, and Hearing loss, and Heart disease, and Hyperlipidemia, and Hypertension, and Kidney disease, and Learning disabilities, and Mental illness, and Mental retardation, and Miscarriages / Stillbirths, and Stroke, and Vision loss, . Current facility-administered medications:0.9 % NaCl with KCl 20 mEq/ L  infusion, ,  Intravenous, Continuous, Delfina Redwood, MD, Last Rate: 75 mL/hr at 05/21/13 1502;  acetaminophen (TYLENOL) tablet 650 mg, 650 mg, Oral, Q6H PRN, Delfina Redwood, MD;  dextrose 5 %-0.45 % sodium chloride infusion, , Intravenous, STAT, Elyn Peers, MD, Last Rate: 125 mL/hr at 05/21/13 0959 ondansetron (ZOFRAN) injection 4 mg, 4 mg, Intravenous, Q6H PRN, Delfina Redwood, MD;  zolpidem (AMBIEN) tablet 5 mg, 5 mg, Oral, QHS PRN, Delfina Redwood, MD No Known Allergies   Objective:     BP 121/73  Pulse 100  Temp(Src) 98.2 F (36.8 C) (Oral)  Resp 18  Ht 5\' 1"  (1.549 m)  Wt 48.081 kg (106 lb)  BMI 20.04 kg/m2  SpO2 100%  LMP 10/24/2012  She is in no distress  Nonicteric  Heart regular rhythm  Lungs clear  Abdomen: Bowel sounds are normal, it is soft, there is a mass which is rather large felt in the right side of her abdomen  Laboratory No components found with this basename: d1      Assessment:     Large mass in the region of the ascending colon of uncertain etiology, and also a 4 cm mass in the distal transverse colon of uncertain etiology which appears to be on a stalk.      Plan:     I will plan to proceed with colonoscopy. Lab  Results  Component Value Date   HGB 8.3* 05/20/2013   HGB 7.5* 04/26/2013   HGB 5.0* 04/25/2013   HCT 27.4* 05/20/2013   HCT 25.3* 04/26/2013   HCT 18.4* 04/25/2013   ALKPHOS 133* 05/20/2013   ALKPHOS 125* 04/26/2013   ALKPHOS 119* 03/21/2013   AST 10 05/20/2013   AST 39* 04/26/2013   AST 8 03/21/2013   ALT 9 05/20/2013   ALT 16 04/26/2013   ALT 5 03/21/2013

## 2013-05-21 NOTE — Consult Note (Signed)
Reason for Consult: colon mass Referring Physician: Dr. Roney Mans is an 22 y.o. female.  HPI: This is a surgical consultation for Cindy [name] who is a 22 year old female with a history of iron deficiency anemia.  She was hospitalized at [facility] at the end of may at which time she was transfused with 4 units of blood according to the patient.  She has been followed by PCP and OB/GYN.  She had a miscarriage in April of 2014. She is otherwise healthy.  She developed cold like symptoms; sneezing, rhinorrhea and cough on Monday followed by nausea and vomiting on Monday. On Tuesday, she was evaluated at an urgent care and subsequently sent to the emergency because of evidence of a abdominal mass.  The patient reports that her weight is stable, however, her normal wieight is 125lb and is now down to 105lbs.  She denies abdominal pain, changes in bowel pattern, melena or hematochezia.  She denies personal history of cancer. Family history includes; PGP with lung cancer, mother with ALS, father and brother are healthy.  She had a cousin who recently died of cancer but she is unsure of the type. She denies history of alcohol, drug or tobacco use.  Past Medical History  Diagnosis Date  . UTI (lower urinary tract infection)   . Anemia   . Blood transfusion without reported diagnosis     Past Surgical History  Procedure Laterality Date  . Appendectomy    . Leg reconstruction using fascial flap    . Still born baby  03/22/13    delivered vaginally    Family History  Problem Relation Age of Onset  . Alcohol abuse Neg Hx   . Arthritis Neg Hx   . Asthma Neg Hx   . Birth defects Neg Hx   . Cancer Neg Hx   . COPD Neg Hx   . Depression Neg Hx   . Diabetes Neg Hx   . Drug abuse Neg Hx   . Early death Neg Hx   . Hearing loss Neg Hx   . Heart disease Neg Hx   . Hyperlipidemia Neg Hx   . Hypertension Neg Hx   . Kidney disease Neg Hx   . Learning disabilities Neg Hx   . Mental  illness Neg Hx   . Mental retardation Neg Hx   . Miscarriages / Stillbirths Neg Hx   . Stroke Neg Hx   . Vision loss Neg Hx   . ALS Mother     Social History:  reports that she has never smoked. She has never used smokeless tobacco. She reports that she does not drink alcohol or use illicit drugs.  Allergies: No Known Allergies  Medications: iron supplement BID  Results for orders placed during the hospital encounter of 05/20/13 (from the past 48 hour(s))  CBC WITH DIFFERENTIAL     Status: Abnormal   Collection Time    05/20/13  8:54 PM      Result Value Range   WBC 19.6 (*) 4.0 - 10.5 K/uL   RBC 3.95  3.87 - 5.11 MIL/uL   Hemoglobin 8.3 (*) 12.0 - 15.0 g/dL   HCT 27.4 (*) 36.0 - 46.0 %   MCV 69.4 (*) 78.0 - 100.0 fL   MCH 21.0 (*) 26.0 - 34.0 pg   MCHC 30.3  30.0 - 36.0 g/dL   RDW 24.8 (*) 11.5 - 15.5 %   Platelets 579 (*) 150 - 400 K/uL  Neutrophils Relative % 68  43 - 77 %   Lymphocytes Relative 23  12 - 46 %   Monocytes Relative 9  3 - 12 %   Eosinophils Relative 0  0 - 5 %   Basophils Relative 0  0 - 1 %   Neutro Abs 13.3 (*) 1.7 - 7.7 K/uL   Lymphs Abs 4.5 (*) 0.7 - 4.0 K/uL   Monocytes Absolute 1.8 (*) 0.1 - 1.0 K/uL   Eosinophils Absolute 0.0  0.0 - 0.7 K/uL   Basophils Absolute 0.0  0.0 - 0.1 K/uL   RBC Morphology POLYCHROMASIA PRESENT     Comment: SPHEROCYTES  COMPREHENSIVE METABOLIC PANEL     Status: Abnormal   Collection Time    05/20/13  8:54 PM      Result Value Range   Sodium 139  135 - 145 mEq/L   Potassium 3.8  3.5 - 5.1 mEq/L   Chloride 101  96 - 112 mEq/L   CO2 28  19 - 32 mEq/L   Glucose, Bld 99  70 - 99 mg/dL   BUN 8  6 - 23 mg/dL   Creatinine, Ser 0.53  0.50 - 1.10 mg/dL   Calcium 9.2  8.4 - 10.5 mg/dL   Total Protein 7.6  6.0 - 8.3 g/dL   Albumin 2.3 (*) 3.5 - 5.2 g/dL   AST 10  0 - 37 U/L   ALT 9  0 - 35 U/L   Alkaline Phosphatase 133 (*) 39 - 117 U/L   Total Bilirubin 0.4  0.3 - 1.2 mg/dL   GFR calc non Af Amer >90  >90 mL/min   GFR  calc Af Amer >90  >90 mL/min   Comment:            The eGFR has been calculated     using the CKD EPI equation.     This calculation has not been     validated in all clinical     situations.     eGFR's persistently     <90 mL/min signify     possible Chronic Kidney Disease.  LIPASE, BLOOD     Status: None   Collection Time    05/20/13  8:54 PM      Result Value Range   Lipase 11  11 - 59 U/L  POCT PREGNANCY, URINE     Status: None   Collection Time    05/21/13 12:11 AM      Result Value Range   Preg Test, Ur NEGATIVE  NEGATIVE   Comment:            THE SENSITIVITY OF THIS     METHODOLOGY IS >24 mIU/mL  LACTIC ACID, PLASMA     Status: None   Collection Time    05/21/13  1:54 AM      Result Value Range   Lactic Acid, Venous 0.6  0.5 - 2.2 mmol/L  POCT I-STAT TROPONIN I     Status: None   Collection Time    05/21/13  4:56 AM      Result Value Range   Troponin i, poc 0.01  0.00 - 0.08 ng/mL   Comment 3            Comment: Due to the release kinetics of cTnI,     a negative result within the first hours     of the onset of symptoms does not rule out     myocardial infarction with certainty.  If myocardial infarction is still suspected,     repeat the test at appropriate intervals.    US Transvaginal Non-ob  05/21/2013   *RADIOLOGY REPORT*  Clinical Data: Abdominal pain.  Evaluate right lower quadrant mass.  TRANSABDOMINAL AND TRANSVAGINAL ULTRASOUND OF PELVIS  Technique:  Both transabdominal and transvaginal ultrasound examinations of the pelvis were performed including evaluation of the uterus, ovaries, adnexal regions, and pelvic cul-de-sac.  Comparison: CT 11/09/2011  Findings:  Uterus:  6.9 x 3.3 x 4.4 cm. Normal echotexture.  No abnormality.  Endometrium: Normal appearance and thickness, 4 mm.  Right Ovary: 4.3 x 2.2 x 2.9 cm.  Dominant follicles in the right ovary, the largest 1.4 cm.  No adnexal masses.  Left Ovary: 2.7 x 1.5 x 2.2 cm.  Multiple small follicles.   Other Findings:  Small amount of free fluid in the pelvis.  IMPRESSION: Unremarkable exam.   Original Report Authenticated By: Rolm Baptise, M.D.   US Pelvis Complete  05/21/2013   *RADIOLOGY REPORT*  Clinical Data: Abdominal pain.  Evaluate right lower quadrant mass.  TRANSABDOMINAL AND TRANSVAGINAL ULTRASOUND OF PELVIS  Technique:  Both transabdominal and transvaginal ultrasound examinations of the pelvis were performed including evaluation of the uterus, ovaries, adnexal regions, and pelvic cul-de-sac.  Comparison: CT 11/09/2011  Findings:  Uterus:  6.9 x 3.3 x 4.4 cm. Normal echotexture.  No abnormality.  Endometrium: Normal appearance and thickness, 4 mm.  Right Ovary: 4.3 x 2.2 x 2.9 cm.  Dominant follicles in the right ovary, the largest 1.4 cm.  No adnexal masses.  Left Ovary: 2.7 x 1.5 x 2.2 cm.  Multiple small follicles.  Other Findings:  Small amount of free fluid in the pelvis.  IMPRESSION: Unremarkable exam.   Original Report Authenticated By: Rolm Baptise, M.D.   Ct Abdomen Pelvis W Contrast  05/21/2013   *RADIOLOGY REPORT*  Clinical Data: Right lower quadrant pain.  CT ABDOMEN AND PELVIS WITH CONTRAST  Technique:  Multidetector CT imaging of the abdomen and pelvis was performed following the standard protocol during bolus administration of intravenous contrast.  Contrast: 67mL OMNIPAQUE IOHEXOL 300 MG/ML  SOLN  Comparison: 11/09/2011  Findings: Heart is borderline in size.  Minimal linear ground-glass opacities in the left base, presumably atelectasis.  Right lung base clear.  No effusions.  Liver, gallbladder, spleen, pancreas, adrenals are unremarkable. Small bilateral nonobstructing renal stones.  Mild fullness of the right renal pelvis appears similar to prior study.  There is marked irregular wall thickening and mass involving the ascending colon and hepatic flexure.  Contrast is seen within the lumen of the colon at this level, at some places the lumen is mildly prominent and dilated.   Colonic wall measures as thick as 3.5 cm in places.  No evidence of bowel obstruction.  Mild wall thickening extends into the transverse colon.  There appears to be a large polypoid mass within the distal transverse colon on a stalk.  The polypoid mass measures maximally 4.0 cm on coronal image 47.  While infection/colitis could have this appearance, the constellation of findings is concerning for possible neoplasm. Lymphoma not excluded.  There are enlarged adjacent mesenteric lymph nodes.  Small bowel is decompressed.  Trace through the tip in the pelvis.  Uterus, adnexa urinary bladder are unremarkable.  No acute bony abnormality.  IMPRESSION: Large irregular mass involving the ascending colon and hepatic flexure of the colon.  This has a cobblestone appearance.  The lumen in areas is dilated.  There is also a  4 cm mass within the distal transverse colon on a stalk.  Constellation of findings concerning for malignancy such as lymphoma.  There are adjacent enlarged mesenteric lymph nodes.  Bilateral nephrolithiasis.  Slight fullness of the right renal pelvis could be related to compression from the large colonic mass or mild chronic UPJ obstruction.  These results were called to Dr. Cheri Guppy at the time of interpretation.   Original Report Authenticated By: Rolm Baptise, M.D.    Review of Systems  Constitutional: Positive for chills. Negative for fever, malaise/fatigue and diaphoresis.  Eyes: Negative for blurred vision and photophobia.  Respiratory: Positive for cough and sputum production. Negative for hemoptysis, shortness of breath and wheezing.   Cardiovascular: Positive for leg swelling. Negative for chest pain, palpitations and PND.  Gastrointestinal: Positive for nausea and vomiting. Negative for heartburn, abdominal pain, diarrhea, constipation, blood in stool and melena.  Genitourinary: Negative for dysuria, urgency, frequency, hematuria and flank pain.  Neurological: Negative for dizziness,  tingling, sensory change, speech change, focal weakness, seizures, loss of consciousness, weakness and headaches.  Psychiatric/Behavioral: Negative for substance abuse. The patient is not nervous/anxious.    Blood pressure 113/70, pulse 95, temperature 99.1 F (37.3 C), temperature source Oral, resp. rate 18, height 5\' 1"  (1.549 m), weight 106 lb (48.081 kg), last menstrual period 10/24/2012, SpO2 100.00%. Physical Exam  Constitutional: She is oriented to person, place, and time. She appears well-developed and well-nourished. No distress.  HENT:  Head: Normocephalic and atraumatic.  Left Ear: External ear normal.  Nose: Nose normal.  Mouth/Throat: Oropharynx is clear and moist. No oropharyngeal exudate.  Eyes: Conjunctivae and EOM are normal. Pupils are equal, round, and reactive to light. Right eye exhibits no discharge. Left eye exhibits no discharge. No scleral icterus.  Neck: Normal range of motion. Neck supple. No thyromegaly present.  Cardiovascular: Normal rate, regular rhythm, normal heart sounds and intact distal pulses.  Exam reveals no gallop and no friction rub.   No murmur heard. Respiratory: Effort normal and breath sounds normal. No respiratory distress. She has no wheezes. She has no rales. She exhibits no tenderness.  GI: Bowel sounds are normal. She exhibits no distension.  Large palpable mass right side of abdomen, firm, immovable  Musculoskeletal: She exhibits no edema and no tenderness.  Lymphadenopathy:    She has no cervical adenopathy.  Neurological: She is oriented to person, place, and time.  Skin: Skin is warm and dry. No rash noted. She is not diaphoretic. No erythema. No pallor.  Psychiatric: She has a normal mood and affect. Her behavior is normal. Judgment and thought content normal.    Assessment/Plan: Colon mass involving the hepatic flexure of colon and ascending colon 4cm distal transverse colon mass -recommend consult to GI and oncology -further  surgical intervention will be based on above consultation -monitor hemoglobin and hematocrit -she is being admitted under medicine service for nausea/vomiting -surgery will follow along.  Dr. Hulen Skains to see the patient while in ED   Oneida Arenas [facility] ANP-BC  Pager [phone]  05/21/2013, 9:39 AM

## 2013-05-21 NOTE — ED Notes (Signed)
Pt in room resting with eyes closed.

## 2013-05-21 NOTE — H&P (Signed)
Hospital Admission Note Date: 05/21/2013  Patient name: Cindy [name] Medical record number: [id number] Date of birth: [birth year] Age: 22 y.o. Gender: female PCP: Vidal Schwalbe, MD  Attending physician: Delfina Redwood, MD  Chief Complaint: sore throat, vomiting  History of Present Illness:  Cindy [name] is an 22 y.o. female who presented to [facility] with sore throat and periodic vomiting for a few days. Abdominal mass found on exam, CT abdomen and pelvis done which showed colonic mass/polypoid and severel wall thickening concerning for malignancy/lymphoma.  Initially tachycardic, and received saline bolus.  Patient has had weight loss of 20 pounds, but thought it was related to recent miscarriage.  Has had nightsweats, weakness, vague cough. Also required recent admission for transfusion.  Leukocytosis noted from 1/14. EDP initially called surgery to admit, who recommended admission to medicine for workup.  Received single dose empiric zosyn. Sore throat now improved.  Nausea improved after zofran.  Asking for food.  Past Medical History  Diagnosis Date  . UTI (lower urinary tract infection)   . Anemia   . Blood transfusion without reported diagnosis     Meds: Prescriptions prior to admission  Medication Sig Dispense Refill  . ferrous sulfate 325 (65 FE) MG tablet Take 325 mg by mouth 2 (two) times daily.        Allergies: Review of patient's allergies indicates no known allergies. History   Social History  . Marital Status: Single    Spouse Name: N/A    Number of Children: N/A  . Years of Education: N/A   Occupational History  . Not on file.   Social History Main Topics  . Smoking status: Never Smoker   . Smokeless tobacco: Never Used  . Alcohol Use: No  . Drug Use: No  . Sexually Active: Yes    Birth Control/ Protection: Condom     Comment: pregnancy   Other Topics Concern  . Not on file   Social History Narrative  . No narrative on file   Family history:   Unknown. Patient is adopted. Past Surgical History  Procedure Laterality Date  . Appendectomy    . Leg reconstruction using fascial flap    . Still born baby  03[birth year]/13    delivered vaginally    Review of Systems: Systems reviewed and as per HPI, otherwise negative.  Physical Exam: Blood pressure 121/73, pulse 100, temperature 98.2 F (36.8 C), temperature source Oral, resp. rate 18, height 5\' 1"  (1.549 m), weight 48.081 kg (106 lb), last menstrual period 10/24/2012, SpO2 100.00%.  BP 122/67  Pulse 100  Temp(Src) 98.7 F (37.1 C) (Oral)  Resp 18  Ht 5\' 1"  (1.549 m)  Wt 48.081 kg (106 lb)  BMI 20.04 kg/m2  SpO2 100%  LMP 10/24/2012  General Appearance:    Thin female, no acute distress  Head:    Normocephalic, without obvious abnormality, atraumatic  Eyes:    PERRL, pale conjunctiva, EOM's intact, fundi    benign, both eyes     Nose:   Nares normal, septum midline, mucosa normal, no drainage    or sinus tenderness  Throat:   Lips, mucosa, and tongue normal; teeth and gums normal  Neck:   Supple, symmetrical, trachea midline, no adenopathy;    thyroid:  no enlargement/tenderness/nodules; no carotid   bruit or JVD  Back:     Symmetric, no curvature, ROM normal, no CVA tenderness  Lungs:     Clear to auscultation bilaterally, respirations unlabored  Chest Wall:  No tenderness or deformity   Heart:    Regular rate and rhythm, S1 and S2 normal, no murmur, rub   or gallop     Abdomen:     Right sided abdominal mass. Normal bowel sounds. nontender  Genitalia:  deferred  Rectal:   deferred  Extremities:   Extremities normal, atraumatic, no cyanosis or edema  Pulses:   2+ and symmetric all extremities  Skin:   Skin color, texture, turgor normal, no rashes or lesions  Lymph nodes:   Cervical, supraclavicular, and axillary nodes normal  Neurologic:   CNII-XII intact, normal strength, sensation and reflexes    throughout    Psychiatric:  Guarded.  Cooperative  Lab  results: Basic Metabolic Panel:  Recent Labs  05/20/13 2054  NA 139  K 3.8  CL 101  CO2 28  GLUCOSE 99  BUN 8  CREATININE 0.53  CALCIUM 9.2   Liver Function Tests:  Recent Labs  05/20/13 2054  AST 10  ALT 9  ALKPHOS 133*  BILITOT 0.4  PROT 7.6  ALBUMIN 2.3*    Recent Labs  05/20/13 2054  LIPASE 11   No results found for this basename: AMMONIA,  in the last 72 hours CBC:  Recent Labs  05/20/13 2054  WBC 19.6*  NEUTROABS 13.3*  HGB 8.3*  HCT 27.4*  MCV 69.4*  PLT 579*    Alcohol Level: No results found for this basename: ETH,  in the last 72 hours Urinalysis:  Recent Labs  05/20/13 1948  LABSPEC >=1.030  PHURINE 6.0  GLUCOSEU NEGATIVE  HGBUR SMALL*  BILIRUBINUR NEGATIVE  KETONESUR NEGATIVE  PROTEINUR 30*  UROBILINOGEN 0.2  NITRITE NEGATIVE  LEUKOCYTESUR TRACE*    Imaging results:  US Transvaginal Non-ob  05/21/2013   *RADIOLOGY REPORT*  Clinical Data: Abdominal pain.  Evaluate right lower quadrant mass.  TRANSABDOMINAL AND TRANSVAGINAL ULTRASOUND OF PELVIS  Technique:  Both transabdominal and transvaginal ultrasound examinations of the pelvis were performed including evaluation of the uterus, ovaries, adnexal regions, and pelvic cul-de-sac.  Comparison: CT 11/09/2011  Findings:  Uterus:  6.9 x 3.3 x 4.4 cm. Normal echotexture.  No abnormality.  Endometrium: Normal appearance and thickness, 4 mm.  Right Ovary: 4.3 x 2.2 x 2.9 cm.  Dominant follicles in the right ovary, the largest 1.4 cm.  No adnexal masses.  Left Ovary: 2.7 x 1.5 x 2.2 cm.  Multiple small follicles.  Other Findings:  Small amount of free fluid in the pelvis.  IMPRESSION: Unremarkable exam.   Original Report Authenticated By: Rolm Baptise, M.D.   US Pelvis Complete  05/21/2013   *RADIOLOGY REPORT*  Clinical Data: Abdominal pain.  Evaluate right lower quadrant mass.  TRANSABDOMINAL AND TRANSVAGINAL ULTRASOUND OF PELVIS  Technique:  Both transabdominal and transvaginal ultrasound  examinations of the pelvis were performed including evaluation of the uterus, ovaries, adnexal regions, and pelvic cul-de-sac.  Comparison: CT 11/09/2011  Findings:  Uterus:  6.9 x 3.3 x 4.4 cm. Normal echotexture.  No abnormality.  Endometrium: Normal appearance and thickness, 4 mm.  Right Ovary: 4.3 x 2.2 x 2.9 cm.  Dominant follicles in the right ovary, the largest 1.4 cm.  No adnexal masses.  Left Ovary: 2.7 x 1.5 x 2.2 cm.  Multiple small follicles.  Other Findings:  Small amount of free fluid in the pelvis.  IMPRESSION: Unremarkable exam.   Original Report Authenticated By: Rolm Baptise, M.D.   Ct Abdomen Pelvis W Contrast  05/21/2013   *RADIOLOGY REPORT*  Clinical Data: Right  lower quadrant pain.  CT ABDOMEN AND PELVIS WITH CONTRAST  Technique:  Multidetector CT imaging of the abdomen and pelvis was performed following the standard protocol during bolus administration of intravenous contrast.  Contrast: 74mL OMNIPAQUE IOHEXOL 300 MG/ML  SOLN  Comparison: 11/09/2011  Findings: Heart is borderline in size.  Minimal linear ground-glass opacities in the left base, presumably atelectasis.  Right lung base clear.  No effusions.  Liver, gallbladder, spleen, pancreas, adrenals are unremarkable. Small bilateral nonobstructing renal stones.  Mild fullness of the right renal pelvis appears similar to prior study.  There is marked irregular wall thickening and mass involving the ascending colon and hepatic flexure.  Contrast is seen within the lumen of the colon at this level, at some places the lumen is mildly prominent and dilated.  Colonic wall measures as thick as 3.5 cm in places.  No evidence of bowel obstruction.  Mild wall thickening extends into the transverse colon.  There appears to be a large polypoid mass within the distal transverse colon on a stalk.  The polypoid mass measures maximally 4.0 cm on coronal image 47.  While infection/colitis could have this appearance, the constellation of findings is  concerning for possible neoplasm. Lymphoma not excluded.  There are enlarged adjacent mesenteric lymph nodes.  Small bowel is decompressed.  Trace through the tip in the pelvis.  Uterus, adnexa urinary bladder are unremarkable.  No acute bony abnormality.  IMPRESSION: Large irregular mass involving the ascending colon and hepatic flexure of the colon.  This has a cobblestone appearance.  The lumen in areas is dilated.  There is also a 4 cm mass within the distal transverse colon on a stalk.  Constellation of findings concerning for malignancy such as lymphoma.  There are adjacent enlarged mesenteric lymph nodes.  Bilateral nephrolithiasis.  Slight fullness of the right renal pelvis could be related to compression from the large colonic mass or mild chronic UPJ obstruction.  These results were called to Dr. Cheri Guppy at the time of interpretation.   Original Report Authenticated By: Rolm Baptise, M.D.    Assessment & Plan:  Colonic mass: with B symptoms, anemia, concerning for lymphoma/malignancy.  Have consulted Dr. Penelope Coop for colonoscopy/biopsy.    Microcytic anemia:  Check anemia panel.  Monitor    Leukocytosis: chronic since at least 1/14.  Does have mild cough.  Will check cxr r/o infiltrate.  Hold further antibiotics for now    Nausea with vomiting: continue zofran as needed.     Herndon L 05/21/2013, 3:12 PM

## 2013-05-21 NOTE — Progress Notes (Signed)
UR completed 

## 2013-05-22 ENCOUNTER — Encounter (HOSPITAL_COMMUNITY)
Admission: EM | Disposition: A | Discharge status: Home / Self Care | Payer: Self-pay | Source: Home / Self Care | Attending: Internal Medicine

## 2013-05-22 DIAGNOSIS — D649 Anemia, unspecified: Secondary | ICD-10-CM

## 2013-05-22 DIAGNOSIS — R1903 Right lower quadrant abdominal swelling, mass and lump: Secondary | ICD-10-CM

## 2013-05-22 LAB — COMPREHENSIVE METABOLIC PANEL
BUN: 3 mg/dL — ABNORMAL LOW (ref 6–23)
CO2: 24 mEq/L (ref 19–32)
Calcium: 8.6 mg/dL (ref 8.4–10.5)
Chloride: 104 mEq/L (ref 96–112)
Creatinine, Ser: 0.55 mg/dL (ref 0.50–1.10)
GFR calc Af Amer: >90 mL/min (ref >90)
GFR calc non Af Amer: >90 mL/min (ref >90)
Glucose, Bld: 90 mg/dL (ref 70–99)
Total Bilirubin: 0.3 mg/dL (ref 0.3–1.2)

## 2013-05-22 LAB — URINALYSIS, ROUTINE W REFLEX MICROSCOPIC
Bilirubin Urine: NEGATIVE
Ketones, ur: NEGATIVE mg/dL
Nitrite: NEGATIVE
Urobilinogen, UA: 0.2 mg/dL (ref 0.0–1.0)

## 2013-05-22 LAB — GLUCOSE, CAPILLARY: Glucose-Capillary: 89 mg/dL (ref 70–99)

## 2013-05-22 SURGERY — COLONOSCOPY
Anesthesia: Moderate Sedation

## 2013-05-22 MED ORDER — ACETAMINOPHEN 160 MG/5ML PO SOLN
650.0000 mg | Freq: Four times a day (QID) | ORAL | Status: DC | PRN
  Administered 2013-05-22 – 2013-05-26 (×4): 650 mg via ORAL
  Filled 2013-05-22 (×4): qty 20.3

## 2013-05-22 NOTE — Progress Notes (Signed)
The patient's colonoscopy is being canceled today because she did not drink her prep. She does not want to drink her prep today either. She is demanding to eat food. She said that she would drink her prep tomorrow which means that we would not be able to do her colonoscopy until Saturday.

## 2013-05-22 NOTE — Progress Notes (Signed)
During the night Pt. Stated that she needed her blood sugar checked because she had not eaten in two days. BS=89. Will continue to monitor pt.

## 2013-05-22 NOTE — Progress Notes (Signed)
Noted bowel prep not drank by patient, patient stated that she cannot tolerated prep and that it makes her nauseous. She claimed she had not eaten for 2 days and wants to eat at this time then proceed with the prep. Explained to the patient about the colonoscopy and that she needed to be NPo prior to that test but patient still refused. Md made aware.

## 2013-05-22 NOTE — Progress Notes (Signed)
Patient ID: Cindy [name], female   DOB: 1990/12/05, 22 y.o.   MRN: [id number]    Subjective: No acute changes but having a hard time drinking prep for colonoscopy which is scheduled for tomrrow, pain a little better  Objective: Vital signs in last 24 hours: Temp:  [98.2 F (36.8 C)-98.7 F (37.1 C)] 98.5 F (36.9 C) (06/26 1013) Pulse Rate:  [96-119] 101 (06/26 1013) Resp:  [17-18] 18 (06/26 1013) BP: (115-140)/(67-85) 119/70 mmHg (06/26 1013) SpO2:  [99 %-100 %] 100 % (06/26 1013) Last BM Date: 05/21/13  Intake/Output from previous day: 06/25 0701 - 06/26 0700 In: 1197.5 [I.V.:1197.5] Out: -  Intake/Output this shift:    PE: Abd: mildly distended, mildly tender, +BS General: appears sleepy, NAD  Lab Results:   Recent Labs  05/20/13 2054 05/21/13 1510  WBC 19.6* 14.5*  HGB 8.3* 7.8*  HCT 27.4* 26.2*  PLT 579* 453*   BMET  Recent Labs  05/20/13 2054 05/22/13 0605  NA 139 138  K 3.8 3.6  CL 101 104  CO2 28 24  GLUCOSE 99 90  BUN 8 3*  CREATININE 0.53 0.55  CALCIUM 9.2 8.6   PT/INR No results found for this basename: LABPROT, INR,  in the last 72 hours CMP     Component Value Date/Time   NA 138 05/22/2013 0605   K 3.6 05/22/2013 0605   CL 104 05/22/2013 0605   CO2 24 05/22/2013 0605   GLUCOSE 90 05/22/2013 0605   BUN 3* 05/22/2013 0605   CREATININE 0.55 05/22/2013 0605   CALCIUM 8.6 05/22/2013 0605   PROT 6.2 05/22/2013 0605   ALBUMIN 1.7* 05/22/2013 0605   AST 7 05/22/2013 0605   ALT 6 05/22/2013 0605   ALKPHOS 106 05/22/2013 0605   BILITOT 0.3 05/22/2013 0605   GFRNONAA >90 05/22/2013 0605   GFRAA >90 05/22/2013 0605   Lipase     Component Value Date/Time   LIPASE 11 05/20/2013 2054       Studies/Results: Dg Chest 2 View  05/21/2013   *RADIOLOGY REPORT*  Clinical Data: Cough.  Leukocytosis.  Colon mass.  CHEST - 2 VIEW  Comparison: None.  Findings: Borderline heart size.  The lungs are clear.  No airspace disease.  No effusion.  Stable prominence  of pulmonary vessels seen on end on the lateral view.  IMPRESSION: Borderline heart size without acute cardiopulmonary disease.   Original Report Authenticated By: Dereck Ligas, M.D.   US Transvaginal Non-ob  05/21/2013   *RADIOLOGY REPORT*  Clinical Data: Abdominal pain.  Evaluate right lower quadrant mass.  TRANSABDOMINAL AND TRANSVAGINAL ULTRASOUND OF PELVIS  Technique:  Both transabdominal and transvaginal ultrasound examinations of the pelvis were performed including evaluation of the uterus, ovaries, adnexal regions, and pelvic cul-de-sac.  Comparison: CT 11/09/2011  Findings:  Uterus:  6.9 x 3.3 x 4.4 cm. Normal echotexture.  No abnormality.  Endometrium: Normal appearance and thickness, 4 mm.  Right Ovary: 4.3 x 2.2 x 2.9 cm.  Dominant follicles in the right ovary, the largest 1.4 cm.  No adnexal masses.  Left Ovary: 2.7 x 1.5 x 2.2 cm.  Multiple small follicles.  Other Findings:  Small amount of free fluid in the pelvis.  IMPRESSION: Unremarkable exam.   Original Report Authenticated By: Rolm Baptise, M.D.   US Pelvis Complete  05/21/2013   *RADIOLOGY REPORT*  Clinical Data: Abdominal pain.  Evaluate right lower quadrant mass.  TRANSABDOMINAL AND TRANSVAGINAL ULTRASOUND OF PELVIS  Technique:  Both transabdominal and transvaginal  ultrasound examinations of the pelvis were performed including evaluation of the uterus, ovaries, adnexal regions, and pelvic cul-de-sac.  Comparison: CT 11/09/2011  Findings:  Uterus:  6.9 x 3.3 x 4.4 cm. Normal echotexture.  No abnormality.  Endometrium: Normal appearance and thickness, 4 mm.  Right Ovary: 4.3 x 2.2 x 2.9 cm.  Dominant follicles in the right ovary, the largest 1.4 cm.  No adnexal masses.  Left Ovary: 2.7 x 1.5 x 2.2 cm.  Multiple small follicles.  Other Findings:  Small amount of free fluid in the pelvis.  IMPRESSION: Unremarkable exam.   Original Report Authenticated By: Rolm Baptise, M.D.   Ct Abdomen Pelvis W Contrast  05/21/2013   *RADIOLOGY REPORT*   Clinical Data: Right lower quadrant pain.  CT ABDOMEN AND PELVIS WITH CONTRAST  Technique:  Multidetector CT imaging of the abdomen and pelvis was performed following the standard protocol during bolus administration of intravenous contrast.  Contrast: 74mL OMNIPAQUE IOHEXOL 300 MG/ML  SOLN  Comparison: 11/09/2011  Findings: Heart is borderline in size.  Minimal linear ground-glass opacities in the left base, presumably atelectasis.  Right lung base clear.  No effusions.  Liver, gallbladder, spleen, pancreas, adrenals are unremarkable. Small bilateral nonobstructing renal stones.  Mild fullness of the right renal pelvis appears similar to prior study.  There is marked irregular wall thickening and mass involving the ascending colon and hepatic flexure.  Contrast is seen within the lumen of the colon at this level, at some places the lumen is mildly prominent and dilated.  Colonic wall measures as thick as 3.5 cm in places.  No evidence of bowel obstruction.  Mild wall thickening extends into the transverse colon.  There appears to be a large polypoid mass within the distal transverse colon on a stalk.  The polypoid mass measures maximally 4.0 cm on coronal image 47.  While infection/colitis could have this appearance, the constellation of findings is concerning for possible neoplasm. Lymphoma not excluded.  There are enlarged adjacent mesenteric lymph nodes.  Small bowel is decompressed.  Trace through the tip in the pelvis.  Uterus, adnexa urinary bladder are unremarkable.  No acute bony abnormality.  IMPRESSION: Large irregular mass involving the ascending colon and hepatic flexure of the colon.  This has a cobblestone appearance.  The lumen in areas is dilated.  There is also a 4 cm mass within the distal transverse colon on a stalk.  Constellation of findings concerning for malignancy such as lymphoma.  There are adjacent enlarged mesenteric lymph nodes.  Bilateral nephrolithiasis.  Slight fullness of the right  renal pelvis could be related to compression from the large colonic mass or mild chronic UPJ obstruction.  These results were called to Dr. Cheri Guppy at the time of interpretation.   Original Report Authenticated By: Rolm Baptise, M.D.    Anti-infectives: Anti-infectives   Start     Dose/Rate Route Frequency Ordered Stop   05/21/13 0900  piperacillin-tazobactam (ZOSYN) IVPB 3.375 g     3.375 g 100 mL/hr over 30 Minutes Intravenous  Once 05/21/13 0854 05/21/13 1156       Assessment/Plan Colon mass involving the hepatic flexure of colon and ascending colon 4cm distal transverse colon mass: colonoscopy tomorrow for path, will plan surgical course pending resutls.   LOS: 2 days    Kayne Yuhas 05/22/2013

## 2013-05-22 NOTE — Progress Notes (Signed)
Pt. Requested something to eat and to have IV pain medication. Pt. States that she feels nauseous and is unable to tolerate drinking prep without eating first.  MD notified. MD stated that she would not allow her to eat but would place a order for her to have Phenergan and Percocet. MD orders explained to pt. . Pt. Refused medications. Pt. Also refused to finish bowel prep. Pt. Strongly encouraged to finish prep. Pt. Still has only drank two cups and refuses to finish.

## 2013-05-22 NOTE — Progress Notes (Signed)
[facility] PROGRESS NOTE  DELAINEE KISHEL [id number] DOB: August 29, 1991 DOA: 05/20/2013 PCP: Vidal Schwalbe, MD  Assessment/Plan:  Active Problems:   Microcytic anemia   Leukocytosis   Colonic mass   Nausea with vomiting  Unable to tolerate prep. Refused any further.  Discussed with Dr. Penelope Coop.  Try prep tomorrow, colonoscopy sat, then discharge home with outpt f/u onc. Repeat cbc in am. No bleeding. May need blood if symptomatic   Code Status: full Family Communication: none today Disposition Plan: home   Consultants:  GI  surgery  Procedures:    Antibiotics:    HPI/Subjective: Some nausea, but ate pizza without difficulty. No bleeding. Not much pain. Lightheaded last night. Better today.  Objective: Filed Vitals:   05/21/13 2155 05/22/13 0528 05/22/13 1013 05/22/13 1317  BP: 122/67 115/71 119/70 120/72  Pulse: 100 104 101 109  Temp: 98.7 F (37.1 C) 98.3 F (36.8 C) 98.5 F (36.9 C) 98 F (36.7 C)  TempSrc: Oral Oral  Oral  Resp: 18 17 18 16   Height:      Weight:      SpO2: 100% 100% 100% 100%    Intake/Output Summary (Last 24 hours) at 05/22/13 1723 Last data filed at 05/22/13 0700  Gross per 24 hour  Intake 1197.5 ml  Output      0 ml  Net 1197.5 ml   Filed Weights   05/20/13 2011  Weight: 48.081 kg (106 lb)    Exam:   General:  comfortable  Cardiovascular: RRR without MGR  Respiratory: CTA without WRR  Abdomen: s, nt, nd  Ext: no CCE   Data Reviewed: Basic Metabolic Panel:  Recent Labs Lab 05/20/13 2054 05/22/13 0605  NA 139 138  K 3.8 3.6  CL 101 104  CO2 28 24  GLUCOSE 99 90  BUN 8 3*  CREATININE 0.53 0.55  CALCIUM 9.2 8.6   Liver Function Tests:  Recent Labs Lab 05/20/13 2054 05/22/13 0605  AST 10 7  ALT 9 6  ALKPHOS 133* 106  BILITOT 0.4 0.3  PROT 7.6 6.2  ALBUMIN 2.3* 1.7*    Recent Labs Lab 05/20/13 2054  LIPASE 11   No results found for this basename: AMMONIA,  in the last 168  hours CBC:  Recent Labs Lab 05/20/13 2054 05/21/13 1510  WBC 19.6* 14.5*  NEUTROABS 13.3* 10.7*  HGB 8.3* 7.8*  HCT 27.4* 26.2*  MCV 69.4* 70.4*  PLT 579* 453*   Cardiac Enzymes: No results found for this basename: CKTOTAL, CKMB, CKMBINDEX, TROPONINI,  in the last 168 hours BNP (last 3 results) No results found for this basename: PROBNP,  in the last 8760 hours CBG:  Recent Labs Lab 05/22/13 0012  GLUCAP 89    No results found for this or any previous visit (from the past 240 hour(s)).   Studies: Dg Chest 2 View  05/21/2013   *RADIOLOGY REPORT*  Clinical Data: Cough.  Leukocytosis.  Colon mass.  CHEST - 2 VIEW  Comparison: None.  Findings: Borderline heart size.  The lungs are clear.  No airspace disease.  No effusion.  Stable prominence of pulmonary vessels seen on end on the lateral view.  IMPRESSION: Borderline heart size without acute cardiopulmonary disease.   Original Report Authenticated By: Dereck Ligas, M.D.   US Transvaginal Non-ob  05/21/2013   *RADIOLOGY REPORT*  Clinical Data: Abdominal pain.  Evaluate right lower quadrant mass.  TRANSABDOMINAL AND TRANSVAGINAL ULTRASOUND OF PELVIS  Technique:  Both transabdominal and transvaginal ultrasound examinations  of the pelvis were performed including evaluation of the uterus, ovaries, adnexal regions, and pelvic cul-de-sac.  Comparison: CT 11/09/2011  Findings:  Uterus:  6.9 x 3.3 x 4.4 cm. Normal echotexture.  No abnormality.  Endometrium: Normal appearance and thickness, 4 mm.  Right Ovary: 4.3 x 2.2 x 2.9 cm.  Dominant follicles in the right ovary, the largest 1.4 cm.  No adnexal masses.  Left Ovary: 2.7 x 1.5 x 2.2 cm.  Multiple small follicles.  Other Findings:  Small amount of free fluid in the pelvis.  IMPRESSION: Unremarkable exam.   Original Report Authenticated By: Rolm Baptise, M.D.   US Pelvis Complete  05/21/2013   *RADIOLOGY REPORT*  Clinical Data: Abdominal pain.  Evaluate right lower quadrant mass.   TRANSABDOMINAL AND TRANSVAGINAL ULTRASOUND OF PELVIS  Technique:  Both transabdominal and transvaginal ultrasound examinations of the pelvis were performed including evaluation of the uterus, ovaries, adnexal regions, and pelvic cul-de-sac.  Comparison: CT 11/09/2011  Findings:  Uterus:  6.9 x 3.3 x 4.4 cm. Normal echotexture.  No abnormality.  Endometrium: Normal appearance and thickness, 4 mm.  Right Ovary: 4.3 x 2.2 x 2.9 cm.  Dominant follicles in the right ovary, the largest 1.4 cm.  No adnexal masses.  Left Ovary: 2.7 x 1.5 x 2.2 cm.  Multiple small follicles.  Other Findings:  Small amount of free fluid in the pelvis.  IMPRESSION: Unremarkable exam.   Original Report Authenticated By: Rolm Baptise, M.D.   Ct Abdomen Pelvis W Contrast  05/21/2013   *RADIOLOGY REPORT*  Clinical Data: Right lower quadrant pain.  CT ABDOMEN AND PELVIS WITH CONTRAST  Technique:  Multidetector CT imaging of the abdomen and pelvis was performed following the standard protocol during bolus administration of intravenous contrast.  Contrast: 32mL OMNIPAQUE IOHEXOL 300 MG/ML  SOLN  Comparison: 11/09/2011  Findings: Heart is borderline in size.  Minimal linear ground-glass opacities in the left base, presumably atelectasis.  Right lung base clear.  No effusions.  Liver, gallbladder, spleen, pancreas, adrenals are unremarkable. Small bilateral nonobstructing renal stones.  Mild fullness of the right renal pelvis appears similar to prior study.  There is marked irregular wall thickening and mass involving the ascending colon and hepatic flexure.  Contrast is seen within the lumen of the colon at this level, at some places the lumen is mildly prominent and dilated.  Colonic wall measures as thick as 3.5 cm in places.  No evidence of bowel obstruction.  Mild wall thickening extends into the transverse colon.  There appears to be a large polypoid mass within the distal transverse colon on a stalk.  The polypoid mass measures maximally 4.0  cm on coronal image 47.  While infection/colitis could have this appearance, the constellation of findings is concerning for possible neoplasm. Lymphoma not excluded.  There are enlarged adjacent mesenteric lymph nodes.  Small bowel is decompressed.  Trace through the tip in the pelvis.  Uterus, adnexa urinary bladder are unremarkable.  No acute bony abnormality.  IMPRESSION: Large irregular mass involving the ascending colon and hepatic flexure of the colon.  This has a cobblestone appearance.  The lumen in areas is dilated.  There is also a 4 cm mass within the distal transverse colon on a stalk.  Constellation of findings concerning for malignancy such as lymphoma.  There are adjacent enlarged mesenteric lymph nodes.  Bilateral nephrolithiasis.  Slight fullness of the right renal pelvis could be related to compression from the large colonic mass or mild chronic UPJ obstruction.  These results were called to Dr. Cheri Guppy at the time of interpretation.   Original Report Authenticated By: Rolm Baptise, M.D.    Scheduled Meds: . oxyCODONE-acetaminophen  1 tablet Oral Once  . promethazine  12.5 mg Intravenous Once   Continuous Infusions: . sodium chloride    . 0.9 % NaCl with KCl 20 mEq / L 75 mL/hr at 05/21/13 1502   Time spent: 25 min  [facility] Pager [phone]. If 7PM-7AM, please contact night-coverage at [email or web address], password [facility] 05/22/2013, 5:23 PM  LOS: 2 days

## 2013-05-23 DIAGNOSIS — J029 Acute pharyngitis, unspecified: Secondary | ICD-10-CM

## 2013-05-23 LAB — CBC
MCV: 70 fL — ABNORMAL LOW (ref 78.0–100.0)
Platelets: 480 10*3/uL — ABNORMAL HIGH (ref 150–400)
RBC: 3.6 MIL/uL — ABNORMAL LOW (ref 3.87–5.11)
RDW: 24 % — ABNORMAL HIGH (ref 11.5–15.5)
WBC: 12.7 10*3/uL — ABNORMAL HIGH (ref 4.0–10.5)

## 2013-05-23 MED ORDER — MENTHOL 3 MG MT LOZG
1.0000 | LOZENGE | OROMUCOSAL | Status: DC | PRN
  Administered 2013-05-23: 3 mg via ORAL
  Filled 2013-05-23: qty 9

## 2013-05-23 MED ORDER — PEG 3350-KCL-NA BICARB-NACL 420 G PO SOLR
4000.0000 mL | Freq: Once | ORAL | Status: AC
  Administered 2013-05-23: 4000 mL via ORAL
  Filled 2013-05-23: qty 4000

## 2013-05-23 MED ORDER — SODIUM CHLORIDE 0.9 % IV SOLN
INTRAVENOUS | Status: DC
  Administered 2013-05-24: 01:00:00 via INTRAVENOUS

## 2013-05-23 NOTE — Progress Notes (Signed)
Subjective: Pt feels good, no real abdominal pain N/V.  Pt refused to drink her contrast on Wednesday so she couldn't have the prep done Yesterday.  She has agreed to drink her contrast today so she can have a colonoscopy on Saturday.    Objective: Vital signs in last 24 hours: Temp:  [98 F (36.7 C)-99.8 F (37.7 C)] 98.3 F (36.8 C) (06/27 0548) Pulse Rate:  [83-114] 83 (06/27 0548) Resp:  [16-19] 16 (06/27 0548) BP: (115-130)/(64-76) 115/64 mmHg (06/27 0548) SpO2:  [98 %-100 %] 100 % (06/27 0548) Last BM Date: 05/21/13  Intake/Output from previous day:   Intake/Output this shift:    PE: Gen:  Alert, NAD, pleasant Abd: Soft, NT, mild distension, +BS, large palpable mass in the right side of the abdomen half way between the costal margin and pelvis   Lab Results:   Recent Labs  05/21/13 1510 05/23/13 0410  WBC 14.5* 12.7*  HGB 7.8* 7.4*  HCT 26.2* 25.2*  PLT 453* 480*   BMET  Recent Labs  05/20/13 2054 05/22/13 0605  NA 139 138  K 3.8 3.6  CL 101 104  CO2 28 24  GLUCOSE 99 90  BUN 8 3*  CREATININE 0.53 0.55  CALCIUM 9.2 8.6   PT/INR No results found for this basename: LABPROT, INR,  in the last 72 hours CMP     Component Value Date/Time   NA 138 05/22/2013 0605   K 3.6 05/22/2013 0605   CL 104 05/22/2013 0605   CO2 24 05/22/2013 0605   GLUCOSE 90 05/22/2013 0605   BUN 3* 05/22/2013 0605   CREATININE 0.55 05/22/2013 0605   CALCIUM 8.6 05/22/2013 0605   PROT 6.2 05/22/2013 0605   ALBUMIN 1.7* 05/22/2013 0605   AST 7 05/22/2013 0605   ALT 6 05/22/2013 0605   ALKPHOS 106 05/22/2013 0605   BILITOT 0.3 05/22/2013 0605   GFRNONAA >90 05/22/2013 0605   GFRAA >90 05/22/2013 0605   Lipase     Component Value Date/Time   LIPASE 11 05/20/2013 2054       Studies/Results: Dg Chest 2 View  05/21/2013   *RADIOLOGY REPORT*  Clinical Data: Cough.  Leukocytosis.  Colon mass.  CHEST - 2 VIEW  Comparison: None.  Findings: Borderline heart size.  The lungs are clear.   No airspace disease.  No effusion.  Stable prominence of pulmonary vessels seen on end on the lateral view.  IMPRESSION: Borderline heart size without acute cardiopulmonary disease.   Original Report Authenticated By: Dereck Ligas, M.D.    Anti-infectives: Anti-infectives   Start     Dose/Rate Route Frequency Ordered Stop   05/21/13 0900  piperacillin-tazobactam (ZOSYN) IVPB 3.375 g     3.375 g 100 mL/hr over 30 Minutes Intravenous  Once 05/21/13 0854 05/21/13 1156       Assessment/Plan Colon mass involving the hepatic flexure of colon and ascending colon 4cm distal transverse colon mass:  1.  Colonoscopy on Saturday since she refused to drink the contrast previously 2.  Talked with Dr. Georgette Dover who will be DOW next week, his preference is to keep her on clear liquids after the CSP then perform surgery sometime next week 3.  May need a small prep on Sunday and hopefully surgery on Monday, but need path from both masses to determine how much colon needs to be taken out 4.   Ambulate off the floor with a tech/RN 5.  SCD, ok to start lovenox/heparin  if okay with GI  Sore throat - start cepacol cont tylenol     LOS: 3 days    Cindy [name], Cindy [name] 05/23/2013, 7:40 AM Pager: [phone]

## 2013-05-23 NOTE — Progress Notes (Signed)
[facility] PROGRESS NOTE  Cindy [name] [id number] DOB: [birth year] DOA: 05/20/2013 PCP: Vidal Schwalbe, MD  Assessment/Plan:  Active Problems:   Microcytic anemia   Leukocytosis   Colonic mass   Nausea with vomiting  Tolerating prep.  For colonoscopy tomorrow  Code Status: full Family Communication: none today Disposition Plan: home   Consultants:  GI  surgery  Procedures:    Antibiotics:    HPI/Subjective: No n/v/pain.  Objective: Filed Vitals:   05/22/13 1800 05/22/13 2145 05/23/13 0548 05/23/13 1300  BP: 130/74 130/76 115/64 133/71  Pulse: 114 106 83 92  Temp: 99.6 F (37.6 C) 99.8 F (37.7 C) 98.3 F (36.8 C) 97.7 F (36.5 C)  TempSrc: Oral Oral Oral Oral  Resp: 19 18 16 18   Height:      Weight:      SpO2: 98% 99% 100% 100%   No intake or output data in the 24 hours ending 05/23/13 2107 Filed Weights   05/20/13 2011  Weight: 48.081 kg (106 lb)    Exam:   General:  comfortable  Cardiovascular: RRR without MGR  Respiratory: CTA without WRR  Abdomen: s, nt, nd  Ext: no CCE   Data Reviewed: Basic Metabolic Panel:  Recent Labs Lab 05/20/13 2054 05/22/13 0605  NA 139 138  K 3.8 3.6  CL 101 104  CO2 28 24  GLUCOSE 99 90  BUN 8 3*  CREATININE 0.53 0.55  CALCIUM 9.2 8.6   Liver Function Tests:  Recent Labs Lab 05/20/13 2054 05/22/13 0605  AST 10 7  ALT 9 6  ALKPHOS 133* 106  BILITOT 0.4 0.3  PROT 7.6 6.2  ALBUMIN 2.3* 1.7*    Recent Labs Lab 05/20/13 2054  LIPASE 11   No results found for this basename: AMMONIA,  in the last 168 hours CBC:  Recent Labs Lab 05/20/13 2054 05/21/13 1510 05/23/13 0410  WBC 19.6* 14.5* 12.7*  NEUTROABS 13.3* 10.7*  --   HGB 8.3* 7.8* 7.4*  HCT 27.4* 26.2* 25.2*  MCV 69.4* 70.4* 70.0*  PLT 579* 453* 480*   Cardiac Enzymes: No results found for this basename: CKTOTAL, CKMB, CKMBINDEX, TROPONINI,  in the last 168 hours BNP (last 3 results) No results found  for this basename: PROBNP,  in the last 8760 hours CBG:  Recent Labs Lab 05/22/13 0012  GLUCAP 89    Recent Results (from the past 240 hour(s))  CULTURE, BLOOD (ROUTINE X 2)     Status: None   Collection Time    05/21/13  3:00 PM      Result Value Range Status   Specimen Description BLOOD LEFT ARM   Final   Special Requests     Final   Value: BOTTLES DRAWN AEROBIC AND ANAEROBIC 10CC AER,5CC ANA   Culture  Setup Time 05/22/2013 01:15   Final   Culture     Final   Value:        BLOOD CULTURE RECEIVED NO GROWTH TO DATE CULTURE WILL BE HELD FOR 5 DAYS BEFORE ISSUING A FINAL NEGATIVE REPORT   Report Status PENDING   Incomplete  CULTURE, BLOOD (ROUTINE X 2)     Status: None   Collection Time    05/21/13  3:15 PM      Result Value Range Status   Specimen Description BLOOD LEFT HAND   Final   Special Requests BOTTLES DRAWN AEROBIC ONLY 10CC   Final   Culture  Setup Time 05/22/2013 01:15   Final  Culture     Final   Value:        BLOOD CULTURE RECEIVED NO GROWTH TO DATE CULTURE WILL BE HELD FOR 5 DAYS BEFORE ISSUING A FINAL NEGATIVE REPORT   Report Status PENDING   Incomplete     Studies: No results found.  Scheduled Meds: . oxyCODONE-acetaminophen  1 tablet Oral Once  . promethazine  12.5 mg Intravenous Once   Continuous Infusions: . sodium chloride     Time spent: 25 min  [facility] Pager [phone]. If 7PM-7AM, please contact night-coverage at [email or web address], password [facility] 05/23/2013, 9:07 PM  LOS: 3 days

## 2013-05-23 NOTE — Progress Notes (Signed)
She has no specific complaints today. She is agreeable to proceed with her prep today for colonoscopy tomorrow.

## 2013-05-23 NOTE — Progress Notes (Signed)
Needs colonoscopy before anything surgical can be considered.  Kathryne Eriksson. Dahlia Bailiff, MD, [place] [phone] [facility]

## 2013-05-24 ENCOUNTER — Encounter (HOSPITAL_COMMUNITY): Payer: Self-pay | Admitting: *Deleted

## 2013-05-24 ENCOUNTER — Encounter (HOSPITAL_COMMUNITY)
Admission: EM | Disposition: A | Discharge status: Home / Self Care | Payer: Self-pay | Source: Home / Self Care | Attending: Internal Medicine

## 2013-05-24 DIAGNOSIS — C189 Malignant neoplasm of colon, unspecified: Secondary | ICD-10-CM

## 2013-05-24 HISTORY — PX: COLONOSCOPY: SHX5424

## 2013-05-24 SURGERY — COLONOSCOPY
Anesthesia: Moderate Sedation

## 2013-05-24 MED ORDER — FENTANYL CITRATE 0.05 MG/ML IJ SOLN
INTRAMUSCULAR | Status: DC | PRN
  Administered 2013-05-24 (×3): 25 ug via INTRAVENOUS

## 2013-05-24 MED ORDER — MIDAZOLAM HCL 5 MG/5ML IJ SOLN
INTRAMUSCULAR | Status: DC | PRN
  Administered 2013-05-24 (×3): 2.5 mg via INTRAVENOUS

## 2013-05-24 NOTE — Progress Notes (Signed)
[facility] PROGRESS NOTE  Cindy [name] [id number] DOB: 1991/06/01 DOA: 05/20/2013 PCP: Vidal Schwalbe, MD  Assessment/Plan:  Active Problems:   Microcytic anemia   Leukocytosis   Colonic mass   Nausea with vomiting  Await pathology. Appreciate consultants.  Code Status: full Family Communication: none today Disposition Plan: home   Consultants:  GI  surgery  Procedures:    Antibiotics:    HPI/Subjective: No new complaints.; Her cousin who is a Stage manager at [employer] in [facility].  Dr. Rosemarie Ax [phone]  Objective: Filed Vitals:   05/24/13 1054 05/24/13 1139 05/24/13 1430 05/24/13 1901  BP: 105/58 98/53 122/79 116/72  Pulse: 92 86 116 124  Temp: 97.4 F (36.3 C) 97.2 F (36.2 C) 98 F (36.7 C) 97.6 F (36.4 C)  TempSrc: Axillary Axillary Axillary Axillary  Resp: 24  24 18   Height:      Weight:      SpO2: 100% 100% 100% 100%    Intake/Output Summary (Last 24 hours) at 05/24/13 1920 Last data filed at 05/24/13 0514  Gross per 24 hour  Intake  92.33 ml  Output      0 ml  Net  92.33 ml   Filed Weights   05/20/13 2011  Weight: 48.081 kg (106 lb)    Exam:   General:  comfortable  Cardiovascular: RRR without MGR  Respiratory: CTA without WRR  Abdomen: s, nt, nd  Ext: no CCE   Data Reviewed: Basic Metabolic Panel:  Recent Labs Lab 05/20/13 2054 05/22/13 0605  NA 139 138  K 3.8 3.6  CL 101 104  CO2 28 24  GLUCOSE 99 90  BUN 8 3*  CREATININE 0.53 0.55  CALCIUM 9.2 8.6   Liver Function Tests:  Recent Labs Lab 05/20/13 2054 05/22/13 0605  AST 10 7  ALT 9 6  ALKPHOS 133* 106  BILITOT 0.4 0.3  PROT 7.6 6.2  ALBUMIN 2.3* 1.7*    Recent Labs Lab 05/20/13 2054  LIPASE 11   No results found for this basename: AMMONIA,  in the last 168 hours CBC:  Recent Labs Lab 05/20/13 2054 05/21/13 1510 05/23/13 0410  WBC 19.6* 14.5* 12.7*  NEUTROABS 13.3* 10.7*  --   HGB 8.3* 7.8* 7.4*  HCT  27.4* 26.2* 25.2*  MCV 69.4* 70.4* 70.0*  PLT 579* 453* 480*   Cardiac Enzymes: No results found for this basename: CKTOTAL, CKMB, CKMBINDEX, TROPONINI,  in the last 168 hours BNP (last 3 results) No results found for this basename: PROBNP,  in the last 8760 hours CBG:  Recent Labs Lab 05/22/13 0012  GLUCAP 89    Recent Results (from the past 240 hour(s))  CULTURE, BLOOD (ROUTINE X 2)     Status: None   Collection Time    05/21/13  3:00 PM      Result Value Range Status   Specimen Description BLOOD LEFT ARM   Final   Special Requests     Final   Value: BOTTLES DRAWN AEROBIC AND ANAEROBIC 10CC AER,5CC ANA   Culture  Setup Time 05/22/2013 01:15   Final   Culture     Final   Value:        BLOOD CULTURE RECEIVED NO GROWTH TO DATE CULTURE WILL BE HELD FOR 5 DAYS BEFORE ISSUING A FINAL NEGATIVE REPORT   Report Status PENDING   Incomplete  CULTURE, BLOOD (ROUTINE X 2)     Status: None   Collection Time    05/21/13  3:15  PM      Result Value Range Status   Specimen Description BLOOD LEFT HAND   Final   Special Requests BOTTLES DRAWN AEROBIC ONLY 10CC   Final   Culture  Setup Time 05/22/2013 01:15   Final   Culture     Final   Value:        BLOOD CULTURE RECEIVED NO GROWTH TO DATE CULTURE WILL BE HELD FOR 5 DAYS BEFORE ISSUING A FINAL NEGATIVE REPORT   Report Status PENDING   Incomplete     Studies: No results found.  Scheduled Meds: . oxyCODONE-acetaminophen  1 tablet Oral Once  . promethazine  12.5 mg Intravenous Once   Continuous Infusions:   Time spent: 25 min  [facility] Pager [phone]. If 7PM-7AM, please contact night-coverage at [email or web address], password [email or web address] 05/24/2013, 7:20 PM  LOS: 4 days

## 2013-05-24 NOTE — Progress Notes (Signed)
Addendum. Patient has a very large obviously bulky distal  ascending colon mass I could not traverse. It was hard like a malignancy. However she also had 2 more greater than 3-4 cm softer polypoid masses that were quite bulky, one in the transverse and one estimated in the descending colon. There was a large 3 cm pedunculated polyp at the rectosigmoid which I removed. However there were multiple small sessile less than 6 cm polyps in all segments of the colon several of which were randomly biopsied.  Her young age and the small polyps throughout the colon raise a question of familial polyposis.  Will await pathology and unless in expected histology such as lymphoma etc. will need to be evaluated for familial polyposis and consideration of total colectomy with ileal pouch anal anastomosis.

## 2013-05-24 NOTE — Progress Notes (Signed)
Day of Surgery  Subjective:  Alert and stable. Cooperative. Minimal abdominal pain. She drank half of the prep and is having watery stools. He ready for her colonoscopy.  CT scan reviewed. A very large, very bulky mass of the right colon and hepatic flexure. Second, baseball size mass of the distal transverse colon. No obstruction radiographically.  Hemoglobin 7.4 yesterday.  Objective: Vital signs in last 24 hours: Temp:  [97.7 F (36.5 C)-99.7 F (37.6 C)] 98.8 F (37.1 C) (06/28 0511) Pulse Rate:  [91-106] 91 (06/28 0511) Resp:  [17-19] 17 (06/28 0511) BP: (95-133)/(61-80) 95/61 mmHg (06/28 0511) SpO2:  [100 %] 100 % (06/28 0511) Last BM Date: 05/24/13  Intake/Output from previous day: 06/27 0701 - 06/28 0700 In: 92.3 [I.V.:92.3] Out: -  Intake/Output this shift:    General appearance: Alert. No distress. Mental status normal. GI: soft. Large palpable mass central and right abdomen. Slightly tender. No hernia.  Lab Results:   Recent Labs  05/21/13 1510 05/23/13 0410  WBC 14.5* 12.7*  HGB 7.8* 7.4*  HCT 26.2* 25.2*  PLT 453* 480*   BMET  Recent Labs  05/22/13 0605  NA 138  K 3.6  CL 104  CO2 24  GLUCOSE 90  BUN 3*  CREATININE 0.55  CALCIUM 8.6   PT/INR No results found for this basename: LABPROT, INR,  in the last 72 hours ABG No results found for this basename: PHART, PCO2, PO2, HCO3,  in the last 72 hours  Studies/Results: No results found.  Anti-infectives: Anti-infectives   Start     Dose/Rate Route Frequency Ordered Stop   05/21/13 0900  piperacillin-tazobactam (ZOSYN) IVPB 3.375 g     3.375 g 100 mL/hr over 30 Minutes Intravenous  Once 05/21/13 0854 05/21/13 1156      Assessment/Plan: s/p Procedure(s): COLONOSCOPY  Bulky colonic mass of the ascending colon and hepatic flexure with synchronous 4 cm distal transverse colonic mass. Lymphoma seems more likely than adenocarcinoma or GIST.  Significant anemia. Tolerating  well.  Proceed with colonoscopy today. Eventually she will need resection. Hopefully she can have primary anastomosis.  She should stay on clear liquids only in anticipation of surgery Monday or Tuesday. Check CBC tomorrow.   LOS: 4 days    Cedar Roseman M. Dalbert Batman, M.D., [facility] General and Minimally invasive Surgery Breast and Colorectal Surgery Office:   [phone] Pager:   [phone]  05/24/2013

## 2013-05-24 NOTE — Op Note (Signed)
[facility] [place], [address]   COLONOSCOPY PROCEDURE REPORT  PATIENT: Cindy [name], Cindy [name]  MR#: [id number] BIRTHDATE: Dec 31, 1990 , 22  yrs. old GENDER: Female ENDOSCOPIST: Teena Irani, MD REFERRED BY: PROCEDURE DATE:  05/24/2013 PROCEDURE: ASA CLASS: INDICATIONS: colonic mass on CT scan MEDICATIONS:  fentanyl 100 mcg, Versed 10 mg  FINDINGS :large obstructing hard mass in the distal acending colon, unable to traverse. Biopsies taken Relatively soft and nonobstructing polypoid massin the mid transverse colon biopsies taken Similar soft and nonobstructing slightly smaller polypoid mass in the descending colon, not biopsied. Large pedunculated rectosigmoid polyp removed by snare Multiple small sessile polyps from 2-6 mm scattered throughout all sections of the colon  COMPLICATIONS: None  ENDOSCOPIC IMPRESSION:obstructing ascending colon mass, Endoscopically unresectable soft polypoid masses in the transverse and descending colon Large pedunculated rectal polyp approximately 2-1/2-3 cm, snared. Multiple small sessile less than 6 cm polyps scattered through all sections of the colon including the rectum, raising the question of familial polyposis.   RECOMMENDATIONS:Await pathology which has been rushed. If the lesions above are in the adenoma carcinoma spectrum will need genetic testing for familial polyposis and if positive total colectomy with ileal pouch anal anastomosis, otherwise at minimum will probably need subtotal colectomy.    _______________________________ Lorrin MaisTeena Irani, MD 05/24/2013 10:26 AM

## 2013-05-24 NOTE — Progress Notes (Signed)
0030 Patient stated she could not drink anymore of the golytely. Patient had drink 1/2 of the golytely and stated that her stools are liquid light brown. Will continue to monitor.

## 2013-05-25 LAB — CBC
HCT: 25.1 % — ABNORMAL LOW (ref 36.0–46.0)
Hemoglobin: 7.5 g/dL — ABNORMAL LOW (ref 12.0–15.0)
MCHC: 29.9 g/dL — ABNORMAL LOW (ref 30.0–36.0)
RBC: 3.61 MIL/uL — ABNORMAL LOW (ref 3.87–5.11)
WBC: 14.2 10*3/uL — ABNORMAL HIGH (ref 4.0–10.5)

## 2013-05-25 LAB — CEA: CEA: 1074.2 ng/mL — ABNORMAL HIGH (ref 0.0–5.0)

## 2013-05-25 LAB — TYPE AND SCREEN: Antibody Screen: NEGATIVE

## 2013-05-25 MED ORDER — PSEUDOEPHEDRINE HCL 30 MG PO TABS
30.0000 mg | ORAL_TABLET | Freq: Four times a day (QID) | ORAL | Status: DC | PRN
  Administered 2013-05-25 – 2013-05-26 (×3): 30 mg via ORAL
  Filled 2013-05-25 (×4): qty 1

## 2013-05-25 NOTE — Progress Notes (Signed)
[facility] PROGRESS NOTE  Cindy [name] [id number] DOB: 07/15/1991 DOA: 05/20/2013 PCP: Vidal Schwalbe, MD  Assessment/Plan:  Active Problems:   Microcytic anemia   Leukocytosis   Colonic mass   Nausea with vomiting  Await pathology. Appreciate consultants.  Code Status: full Family Communication: none today Disposition Plan: home   Consultants:  GI  surgery  Procedures:    Antibiotics:    HPI/Subjective: No new complaints.; Requests I contact Her cousin who is a Stage manager at [employer] in [place] [facility] when path is back.  Dr. Rosemarie Ax [phone]  Objective: Filed Vitals:   05/24/13 1901 05/24/13 2119 05/25/13 0227 05/25/13 0606  BP: 116/72 114/59 115/70 99/45  Pulse: 124 71 70 75  Temp: 97.6 F (36.4 C) 98.7 F (37.1 C) 98.6 F (37 C) 98.5 F (36.9 C)  TempSrc: Axillary Oral Oral Oral  Resp: 18 18 16 16   Height:      Weight:      SpO2: 100% 100% 100% 100%   No intake or output data in the 24 hours ending 05/25/13 1125 Filed Weights   05/20/13 2011  Weight: 48.081 kg (106 lb)    Exam:   General:  comfortable  Cardiovascular: RRR without MGR  Respiratory: CTA without WRR  Abdomen: s, nt, nd  Ext: no CCE   Data Reviewed: Basic Metabolic Panel:  Recent Labs Lab 05/20/13 2054 05/22/13 0605  NA 139 138  K 3.8 3.6  CL 101 104  CO2 28 24  GLUCOSE 99 90  BUN 8 3*  CREATININE 0.53 0.55  CALCIUM 9.2 8.6   Liver Function Tests:  Recent Labs Lab 05/20/13 2054 05/22/13 0605  AST 10 7  ALT 9 6  ALKPHOS 133* 106  BILITOT 0.4 0.3  PROT 7.6 6.2  ALBUMIN 2.3* 1.7*    Recent Labs Lab 05/20/13 2054  LIPASE 11   No results found for this basename: AMMONIA,  in the last 168 hours CBC:  Recent Labs Lab 05/20/13 2054 05/21/13 1510 05/23/13 0410 05/25/13 0525  WBC 19.6* 14.5* 12.7* 14.2*  NEUTROABS 13.3* 10.7*  --   --   HGB 8.3* 7.8* 7.4* 7.5*  HCT 27.4* 26.2* 25.2* 25.1*  MCV 69.4* 70.4* 70.0*  69.5*  PLT 579* 453* 480* 507*   Cardiac Enzymes: No results found for this basename: CKTOTAL, CKMB, CKMBINDEX, TROPONINI,  in the last 168 hours BNP (last 3 results) No results found for this basename: PROBNP,  in the last 8760 hours CBG:  Recent Labs Lab 05/22/13 0012  GLUCAP 89    Recent Results (from the past 240 hour(s))  CULTURE, BLOOD (ROUTINE X 2)     Status: None   Collection Time    05/21/13  3:00 PM      Result Value Range Status   Specimen Description BLOOD LEFT ARM   Final   Special Requests     Final   Value: BOTTLES DRAWN AEROBIC AND ANAEROBIC 10CC AER,5CC ANA   Culture  Setup Time 05/22/2013 01:15   Final   Culture     Final   Value:        BLOOD CULTURE RECEIVED NO GROWTH TO DATE CULTURE WILL BE HELD FOR 5 DAYS BEFORE ISSUING A FINAL NEGATIVE REPORT   Report Status PENDING   Incomplete  CULTURE, BLOOD (ROUTINE X 2)     Status: None   Collection Time    05/21/13  3:15 PM      Result Value Range Status  Specimen Description BLOOD LEFT HAND   Final   Special Requests BOTTLES DRAWN AEROBIC ONLY 10CC   Final   Culture  Setup Time 05/22/2013 01:15   Final   Culture     Final   Value:        BLOOD CULTURE RECEIVED NO GROWTH TO DATE CULTURE WILL BE HELD FOR 5 DAYS BEFORE ISSUING A FINAL NEGATIVE REPORT   Report Status PENDING   Incomplete     Studies: No results found.  Scheduled Meds: . oxyCODONE-acetaminophen  1 tablet Oral Once  . promethazine  12.5 mg Intravenous Once   Continuous Infusions:   Time spent: 25 min  [facility] Pager [phone]. If 7PM-7AM, please contact night-coverage at [email or web address], password [email or web address] 05/25/2013, 11:25 AM  LOS: 5 days

## 2013-05-25 NOTE — Progress Notes (Signed)
1 Day Post-Op  Subjective: Stable and alert. Denies abdominal pain. Having stools. Actually ate a regular diet last night..  Colonoscopy yesterday showed 4 areas of neoplasia. A large obstructing mass in the distal A.; which was biopsied. Second, a relatively soft polypoid mass in the mid transverse colon which was biopsied. Third, ACE similarly soft and nonobstructing polypoid mass in the descending colon, not biopsied. Fourth, a large pedunculated rectosigmoid polyp which was removed by snare.  I have discussed this with the pathology Department, and they are going to get the reports out at 8:00 AM on Monday. I have discussed this with the patient and told her that the histology will have an influence of the extent of surgery. We will hold off on final surgical recommendations in total pathology review.  Hemoglobin remained stable, 7.5 this morning  Objective: Vital signs in last 24 hours: Temp:  [97.2 F (36.2 C)-98.7 F (37.1 C)] 98.5 F (36.9 C) (06/29 0606) Pulse Rate:  [70-124] 75 (06/29 0606) Resp:  [15-29] 16 (06/29 0606) BP: (98-122)/(45-79) 99/45 mmHg (06/29 0606) SpO2:  [98 %-100 %] 100 % (06/29 0606) Last BM Date: 05/24/13  Intake/Output from previous day:   Intake/Output this shift:    General appearance: sleepy, arousable,  flattened affect, no distress. thin. GI: large palpable mass filling the right abdomen, slightly tender. Soft elsewhere. Normal bowel sounds.  Lab Results:   Recent Labs  05/23/13 0410 05/25/13 0525  WBC 12.7* 14.2*  HGB 7.4* 7.5*  HCT 25.2* 25.1*  PLT 480* 507*   BMET No results found for this basename: NA, K, CL, CO2, GLUCOSE, BUN, CREATININE, CALCIUM,  in the last 72 hours PT/INR No results found for this basename: LABPROT, INR,  in the last 72 hours ABG No results found for this basename: PHART, PCO2, PO2, HCO3,  in the last 72 hours  Studies/Results: No results found.  Anti-infectives: Anti-infectives   Start     Dose/Rate  Route Frequency Ordered Stop   05/21/13 0900  piperacillin-tazobactam (ZOSYN) IVPB 3.375 g     3.375 g 100 mL/hr over 30 Minutes Intravenous  Once 05/21/13 0854 05/21/13 1156      Assessment/Plan: s/p Procedure(s): COLONOSCOPY  Multifocal colonic neoplasia as described above. We'll postpone surgery until histopathology is completed, As that will affect extent of resection and level of distal anastomosis.  Full liquid diet.  Review pathology at 8:00 AM Monday   LOS: 5 days    Felicia Bloomquist M. Dalbert Batman, M.D., [facility] General and Minimally invasive Surgery Breast and Colorectal Surgery Office:   [phone] Pager:   [phone]  05/25/2013

## 2013-05-26 ENCOUNTER — Inpatient Hospital Stay (HOSPITAL_COMMUNITY): Payer: Medicaid Other

## 2013-05-26 ENCOUNTER — Encounter (HOSPITAL_COMMUNITY): Payer: Self-pay | Admitting: Gastroenterology

## 2013-05-26 DIAGNOSIS — R0981 Nasal congestion: Secondary | ICD-10-CM | POA: Diagnosis not present

## 2013-05-26 DIAGNOSIS — C189 Malignant neoplasm of colon, unspecified: Secondary | ICD-10-CM

## 2013-05-26 HISTORY — DX: Malignant neoplasm of colon, unspecified: C18.9

## 2013-05-26 MED ORDER — DIPHENHYDRAMINE HCL 25 MG PO CAPS
50.0000 mg | ORAL_CAPSULE | Freq: Four times a day (QID) | ORAL | Status: DC | PRN
  Administered 2013-05-26 – 2013-05-27 (×2): 50 mg via ORAL
  Filled 2013-05-26: qty 1
  Filled 2013-05-26 (×2): qty 2

## 2013-05-26 MED ORDER — ENOXAPARIN SODIUM 40 MG/0.4ML ~~LOC~~ SOLN
40.0000 mg | SUBCUTANEOUS | Status: DC
  Administered 2013-05-26 – 2013-05-27 (×2): 40 mg via SUBCUTANEOUS
  Filled 2013-05-26 (×3): qty 0.4

## 2013-05-26 MED ORDER — FLUTICASONE PROPIONATE 50 MCG/ACT NA SUSP
2.0000 | Freq: Every day | NASAL | Status: DC
  Administered 2013-05-26 – 2013-05-27 (×2): 2 via NASAL
  Filled 2013-05-26: qty 16

## 2013-05-26 MED ORDER — GUAIFENESIN ER 600 MG PO TB12
600.0000 mg | ORAL_TABLET | Freq: Two times a day (BID) | ORAL | Status: DC | PRN
  Administered 2013-05-27: 600 mg via ORAL
  Filled 2013-05-26: qty 1

## 2013-05-26 MED ORDER — IOHEXOL 300 MG/ML  SOLN
80.0000 mL | Freq: Once | INTRAMUSCULAR | Status: AC | PRN
  Administered 2013-05-26: 80 mL via INTRAVENOUS

## 2013-05-26 NOTE — Progress Notes (Signed)
[facility] Progress Note  Subjective: Patient has no new complaints.  Objective: Vital signs in last 24 hours: Temp:  [98.5 F (36.9 C)-99.5 F (37.5 C)] 98.7 F (37.1 C) (06/30 0531) Pulse Rate:  [100-124] 100 (06/30 0531) Resp:  [16-18] 18 (06/30 0531) BP: (104-122)/(59-75) 104/59 mmHg (06/30 0531) SpO2:  [100 %] 100 % (06/30 0531) Weight change:    PE: Unchanged. Abdominal mass palpable  Lab Results: Results for orders placed during the hospital encounter of 05/20/13 (from the past 24 hour(s))  CEA     Status: Abnormal   Collection Time    05/25/13  9:12 AM      Result Value Range   CEA 1074.2 (*) 0.0 - 5.0 ng/mL  TYPE AND SCREEN     Status: None   Collection Time    05/25/13  9:20 AM      Result Value Range   ABO/RH(D) A POS     Antibody Screen NEG     Sample Expiration 05/28/2013      Studies/Results: No results found. Histology from colonoscopy still pending    Assessment: Strong likelihood of proximal colon cancer with 3 other large neoplastic lesion only one of which could be removed with multiple small polyps.  Plan: Await all biopsy results. If all histology is within the adenoma carcinoma spectrum, particularly the small polyps, will need genetic counseling and screening for familial polyposis before definitive surgical procedure decided upon.   [id number] C 05/26/2013, 8:01 AM

## 2013-05-26 NOTE — Progress Notes (Signed)
Discussed with Drs. 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 Genetic testing underway. CT chest pending. Plans per surgery and GI. Updated patient's family at bedside and via phone  Anemia stable  Sinus congestion: flonase and guaifenesin ordered  Code Status: full Family Communication: mother and cousin Disposition Plan: home   Consultants:  GI  surgery  Procedures:  Colonoscopy with biopsy  Antibiotics:    HPI/Subjective: C/o sinus congestion. Upset about diagnosis. Eating ok  Objective: Filed Vitals:   05/25/13 1100 05/25/13 1435 05/25/13 2125 05/26/13 0531  BP: 118/75 122/74 120/75 104/59  Pulse: 121 122 124 100  Temp: 98.9 F (37.2 C) 98.5 F (36.9 C) 99.5 F (37.5 C) 98.7 F (37.1 C)  TempSrc: Oral Axillary Oral Oral  Resp: 16 18 18 18   Height:      Weight:      SpO2: 100% 100% 100% 100%   No intake or output data in the 24 hours ending 05/26/13 1521 Filed Weights   05/20/13 2011  Weight: 48.081 kg (106 lb)    Exam:   General:  tearful  Cardiovascular: RRR without MGR  Respiratory: CTA without WRR  Abdomen: s, nt, nd  Ext: no CCE   Data Reviewed: Basic Metabolic Panel:  Recent Labs Lab 05/20/13 2054 05/22/13 0605  NA 139 138  K 3.8 3.6  CL 101 104  CO2 28 24  GLUCOSE 99 90  BUN 8 3*  CREATININE 0.53 0.55  CALCIUM 9.2 8.6   Liver Function Tests:  Recent Labs Lab 05/20/13 2054 05/22/13 0605  AST 10 7  ALT 9 6  ALKPHOS 133* 106  BILITOT 0.4 0.3  PROT 7.6 6.2  ALBUMIN 2.3* 1.7*    Recent Labs Lab 05/20/13 2054  LIPASE 11   No results found for this basename: AMMONIA,  in the last 168 hours CBC:  Recent Labs Lab 05/20/13 2054 05/21/13 1510 05/23/13 0410 05/25/13 0525  WBC 19.6* 14.5* 12.7* 14.2*  NEUTROABS 13.3* 10.7*  --   --    HGB 8.3* 7.8* 7.4* 7.5*  HCT 27.4* 26.2* 25.2* 25.1*  MCV 69.4* 70.4* 70.0* 69.5*  PLT 579* 453* 480* 507*   Cardiac Enzymes: No results found for this basename: CKTOTAL, CKMB, CKMBINDEX, TROPONINI,  in the last 168 hours BNP (last 3 results) No results found for this basename: PROBNP,  in the last 8760 hours CBG:  Recent Labs Lab 05/22/13 0012  GLUCAP 89    Recent Results (from the past 240 hour(s))  CULTURE, BLOOD (ROUTINE X 2)     Status: None   Collection Time    05/21/13  3:00 PM      Result Value Range Status   Specimen Description BLOOD LEFT ARM   Final   Special Requests     Final   Value: BOTTLES DRAWN AEROBIC AND ANAEROBIC 10CC AER,5CC ANA   Culture  Setup Time 05/22/2013 01:15   Final   Culture     Final   Value:        BLOOD CULTURE RECEIVED NO GROWTH TO DATE CULTURE WILL BE HELD FOR 5 DAYS BEFORE ISSUING A FINAL NEGATIVE REPORT   Report Status PENDING   Incomplete  CULTURE, BLOOD (ROUTINE X 2)     Status: None   Collection Time    05/21/13  3:15 PM      Result Value Range  Status   Specimen Description BLOOD LEFT HAND   Final   Special Requests BOTTLES DRAWN AEROBIC ONLY 10CC   Final   Culture  Setup Time 05/22/2013 01:15   Final   Culture     Final   Value:        BLOOD CULTURE RECEIVED NO GROWTH TO DATE CULTURE WILL BE HELD FOR 5 DAYS BEFORE ISSUING A FINAL NEGATIVE REPORT   Report Status PENDING   Incomplete     Studies: No results found.  Scheduled Meds: . enoxaparin (LOVENOX) injection  40 mg Subcutaneous Q24H  . fluticasone  2 spray Each Nare Daily  . oxyCODONE-acetaminophen  1 tablet Oral Once  . promethazine  12.5 mg Intravenous Once   Continuous Infusions:   Time spent: 35 min  [facility] Pager [phone]. If 7PM-7AM, please contact night-coverage at [email or web address], password [facility] 05/26/2013, 3:21 PM  LOS: 6 days

## 2013-05-26 NOTE — Progress Notes (Signed)
2 Days Post-Op  Subjective: Pt in sad spirits after hearing the news of her pathology.  She notes she has unknown family history because she is adopted.  She also notes she recently miscarried her baby.  She is concerned about getting an ostomy.  She also wanted to know why she has to be on Lovenox.  She is tolerating fulls well.  +BM's none yesterday or today yet.  +flatus.  Objective: Vital signs in last 24 hours: Temp:  [98.5 F (36.9 C)-99.5 F (37.5 C)] 98.7 F (37.1 C) (06/30 0531) Pulse Rate:  [100-124] 100 (06/30 0531) Resp:  [16-18] 18 (06/30 0531) BP: (104-122)/(59-75) 104/59 mmHg (06/30 0531) SpO2:  [100 %] 100 % (06/30 0531) Last BM Date: 05/24/13  Intake/Output from previous day: 06/29 0701 - 06/30 0700 In: 360 [P.O.:360] Out: -  Intake/Output this shift:    PE: Gen:  Alert, NAD, very emotional and concerned Abd:  Soft, NT, mild distension, +BS, large palpable mass in the right side of the abdomen half way between the costal margin and pelvis   Lab Results:   Recent Labs  05/25/13 0525  WBC 14.2*  HGB 7.5*  HCT 25.1*  PLT 507*   BMET No results found for this basename: NA, K, CL, CO2, GLUCOSE, BUN, CREATININE, CALCIUM,  in the last 72 hours PT/INR No results found for this basename: LABPROT, INR,  in the last 72 hours CMP     Component Value Date/Time   NA 138 05/22/2013 0605   K 3.6 05/22/2013 0605   CL 104 05/22/2013 0605   CO2 24 05/22/2013 0605   GLUCOSE 90 05/22/2013 0605   BUN 3* 05/22/2013 0605   CREATININE 0.55 05/22/2013 0605   CALCIUM 8.6 05/22/2013 0605   PROT 6.2 05/22/2013 0605   ALBUMIN 1.7* 05/22/2013 0605   AST 7 05/22/2013 0605   ALT 6 05/22/2013 0605   ALKPHOS 106 05/22/2013 0605   BILITOT 0.3 05/22/2013 0605   GFRNONAA >90 05/22/2013 0605   GFRAA >90 05/22/2013 0605   Lipase     Component Value Date/Time   LIPASE 11 05/20/2013 2054       Studies/Results: No results found.  Anti-infectives: Anti-infectives   Start     Dose/Rate  Route Frequency Ordered Stop   05/21/13 0900  piperacillin-tazobactam (ZOSYN) IVPB 3.375 g     3.375 g 100 mL/hr over 30 Minutes Intravenous  Once 05/21/13 0854 05/21/13 1156       Assessment/Plan Colon mass 4 large colon masses along with multiple smaller polyps pathology says adenoca 1.  CSP done, GI following 2.  Called cancer center and talked to genetic counselor Irving Copas) who recommended that tumor testing be done after surgical specimens are obtained (pathology should do this automatically MSI, IHC to check for Lynch) 3.  Pathologist Dr. Saralyn Pilar said this would be done to the surgical specimens as well 4.  The genetic counselor recommended no urgent genetic testing (most of their patients have it after their surgery), but FAP (APC) gene testing could be done by blood as well as CEA - these were sent off today 5.  She is adopted so has unknown family history 6.  If the patient can tolerate regular food then we can send her home and await the results of the FAP which will determine whether she will need only a right hemicolectomy vs subtotal colectomy with loop ileostomy, Dr. Marcello Moores can do her surgery, but not this week.  She could go home as  early as tomorrow. 7.  If she becomes obstructed then we will await the labs then have her stay in the hospital until we can operate on her.   8.  Obtain a CT chest with contrast to look for lung mets     LOS: 6 days    DORT, Kayson Tasker 05/26/2013, 8:30 AM Pager: [phone]

## 2013-05-26 NOTE — Progress Notes (Signed)
I spent some time discussing the situation with the patient and her family.  She likely has some form of familial polyposis or Lynch syndrome, with the multiple polyps and the early onset of colon adenocarcinoma.  Staging and genetic testing are pending.  CT chest/ CEA/ FAP.  The patient is tolerating full liquids without difficulty and still having bowel movements.  If she continues to do well with full liquids, then she can be discharged to finish her work-up.  She will likely need a total colectomy with an ileoanal pouch with diverting loop ileostomy.  She will then have subsequent ileostomy closure.  If she is unable to tolerate PO's, then she will need an initial right hemicolectomy with end ileostomy, complete her genetic work-up, then undergo probable completion colectomy with pouch and loop ileostomy, with a third surgery to close the ileostomy.    Imogene Burn. Georgette Dover, MD, [facility]  General/ Trauma Surgery  05/26/2013 5:22 PM

## 2013-05-27 DIAGNOSIS — J189 Pneumonia, unspecified organism: Secondary | ICD-10-CM | POA: Diagnosis present

## 2013-05-27 DIAGNOSIS — E876 Hypokalemia: Secondary | ICD-10-CM

## 2013-05-27 DIAGNOSIS — C189 Malignant neoplasm of colon, unspecified: Secondary | ICD-10-CM

## 2013-05-27 LAB — BASIC METABOLIC PANEL
BUN: 9 mg/dL (ref 6–23)
Calcium: 9.2 mg/dL (ref 8.4–10.5)
Creatinine, Ser: 0.52 mg/dL (ref 0.50–1.10)
GFR calc Af Amer: >90 mL/min (ref >90)
GFR calc non Af Amer: >90 mL/min (ref >90)
Glucose, Bld: 114 mg/dL — ABNORMAL HIGH (ref 70–99)

## 2013-05-27 MED ORDER — LEVOFLOXACIN 500 MG PO TABS
500.0000 mg | ORAL_TABLET | Freq: Every day | ORAL | Status: DC
  Filled 2013-05-27: qty 1

## 2013-05-27 MED ORDER — DIPHENHYDRAMINE HCL 50 MG PO CAPS: 50.0000 mg | ORAL_CAPSULE | Freq: Four times a day (QID) | ORAL | Status: DC | PRN

## 2013-05-27 MED ORDER — HYDROCODONE-ACETAMINOPHEN 5-325 MG PO TABS: 1.0000 | ORAL_TABLET | Freq: Four times a day (QID) | ORAL | Status: DC | PRN

## 2013-05-27 MED ORDER — FLUTICASONE PROPIONATE 50 MCG/ACT NA SUSP: 2.0000 | Freq: Every day | NASAL | Status: DC

## 2013-05-27 MED ORDER — ACETAMINOPHEN 325 MG PO TABS: 650.0000 mg | ORAL_TABLET | Freq: Four times a day (QID) | ORAL | Status: DC | PRN

## 2013-05-27 MED ORDER — PROMETHAZINE HCL 12.5 MG PO TABS: 12.5000 mg | ORAL_TABLET | Freq: Four times a day (QID) | ORAL | Status: DC | PRN

## 2013-05-27 MED ORDER — GUAIFENESIN ER 600 MG PO TB12: 600.0000 mg | ORAL_TABLET | Freq: Two times a day (BID) | ORAL | Status: DC | PRN

## 2013-05-27 MED ORDER — LEVOFLOXACIN 500 MG PO TABS: 500.0000 mg | ORAL_TABLET | Freq: Every day | ORAL | Status: DC

## 2013-05-27 NOTE — Progress Notes (Signed)
3 Days Post-Op  Subjective: Pt in good spirits today.  Denies any abdominal pain or distension, no N/V tolerating a regular diet.  Had a BM yesterday and this morning.  Ambulating well.  C/o cough, but no SOB.  Objective: Vital signs in last 24 hours: Temp:  [98.1 F (36.7 C)-100.3 F (37.9 C)] 99.7 F (37.6 C) (07/01 0551) Pulse Rate:  [128-146] 128 (07/01 0551) Resp:  [17-18] 17 (07/01 0551) BP: (113-125)/(66-74) 113/66 mmHg (07/01 0551) SpO2:  [100 %] 100 % (07/01 0551) Last BM Date: 05/26/13  Intake/Output from previous day: 06/30 0701 - 07/01 0700 In: 720 [P.O.:720] Out: -  Intake/Output this shift:    PE: Gen:  Alert, NAD, pleasant Abd: Soft, NT/ND, +BS, large palpable mass in the right side of the abdomen half way between the costal margin and pelvis, no abdominal scars noted   Lab Results:   Recent Labs  05/25/13 0525  WBC 14.2*  HGB 7.5*  HCT 25.1*  PLT 507*   BMET  Recent Labs  05/27/13 0455  NA 133*  K 4.6  CL 97  CO2 26  GLUCOSE 114*  BUN 9  CREATININE 0.52  CALCIUM 9.2   PT/INR No results found for this basename: LABPROT, INR,  in the last 72 hours CMP     Component Value Date/Time   NA 133* 05/27/2013 0455   K 4.6 05/27/2013 0455   CL 97 05/27/2013 0455   CO2 26 05/27/2013 0455   GLUCOSE 114* 05/27/2013 0455   BUN 9 05/27/2013 0455   CREATININE 0.52 05/27/2013 0455   CALCIUM 9.2 05/27/2013 0455   PROT 6.2 05/22/2013 0605   ALBUMIN 1.7* 05/22/2013 0605   AST 7 05/22/2013 0605   ALT 6 05/22/2013 0605   ALKPHOS 106 05/22/2013 0605   BILITOT 0.3 05/22/2013 0605   GFRNONAA >90 05/27/2013 0455   GFRAA >90 05/27/2013 0455   Lipase     Component Value Date/Time   LIPASE 11 05/20/2013 2054       Studies/Results: Ct Chest W Contrast  05/26/2013   *RADIOLOGY REPORT*  Clinical Data: Colon cancer staging  CT CHEST WITH CONTRAST  Technique:  Multidetector CT imaging of the chest was performed following the standard protocol during bolus administration of  intravenous contrast.  Contrast: 31mL OMNIPAQUE IOHEXOL 300 MG/ML  SOLN  Comparison: Chest radiographs dated 05/21/2013  Findings: Mild patchy/ground-glass left lower lobe opacity (series 3/image 39), suspicious for pneumonia.  No suspicious pulmonary nodules. No pleural effusion or pneumothorax.  The visualized thyroid is unremarkable.  The heart is normal in size.  No pericardial effusion.  No suspicious mediastinal, hilar, or axillary lymphadenopathy.  Visualized upper abdomen is unremarkable.  Visualized osseous structures are within normal limits.  IMPRESSION: Mild patchy left lower lobe opacity, suspicious for pneumonia.  No evidence of metastatic disease in the chest.   Original Report Authenticated By: Julian Hy, M.D.    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 CSP done, GI following, may need descending colon mass tattooed 2. Called cancer center and talked to genetic counselor Santiago Glad Powel)  3. The genetic counselor recommended no urgent genetic testing, but FAP (APC)  gene testing could be done by blood as well as CEA - CEA is 1346.4, pending FAP testing 4. Pathologist Dr. Saralyn Pilar said will check MSI, IHC for Lynch on surgical specimens 5. She is adopted so has unknown family history, notes a recent stillbirth of a baby girl 72. Can still plan on sending her home and await the results of the FAP.  If she becomes obstructed before Dr. Marcello Moores can help with the procedure she may need a right hemicolectomy with ostomy.  If we can get this scheduled as an outpatient we are planning a subtotal colectomy with loop ileostomy.   7. If she becomes obstructed she knows to return to the ER or  call the [facility] office 8. CT of chest - no lung mets, but showed left lower lobe opacity suspicious for pneumonia, may need antibiotic treatment - IS 9. F/u appt on 06/02/13 at 2:10pm, the patient needs to arrive no later than 1:40pm for check (the check in process takes 30 minutes for new patients)    LOS: 7 days    DORT, Lory Nowaczyk 05/27/2013, 9:10 AM Pager: [phone]

## 2013-05-27 NOTE — Progress Notes (Addendum)
[facility]  SURGICAL [id number] [place] [address] Phone: [phone]  May 27, 2013  Patient: Cindy [name]  Date of Birth: 10-24-1991  Date of Visit: 05/20/2013    To Whom It May Concern:  Ailyn Flanary was seen and treated in our emergency department on 05/20/2013. LIANNY OLLAR  may return to work on 07/20/2013.  Sincerely,      Doree Barthel, M.D.

## 2013-05-27 NOTE — Progress Notes (Signed)
Staging work-up to this point shows no sign of mets. Tolerating PO's.  No signs of obstruction. Plan d/c home with close follow-up with Dr. Marcello Moores for possible total colectomy/ ileoanal pouch reconstruction.    Imogene Burn. Georgette Dover, MD, [facility] Surgery  General/ Trauma Surgery  05/27/2013 1:46 PM

## 2013-05-28 LAB — CULTURE, BLOOD (ROUTINE X 2): Culture: NO GROWTH

## 2013-06-01 NOTE — Discharge Summary (Addendum)
Physician Discharge Summary  Cindy [name] [id number] DOB: 06-Apr-1991 DOA: 05/20/2013  PCP: Vidal Schwalbe, MD  Admit date: 05/20/2013 Discharge date: 05/27/2013  Time spent: greater than 30 minutes  Recommendations for Outpatient Follow-up:  1. Follow up genetic testing for Familial polyposis Follow up with Dr. Marcello Moores. May requiretotal colectomy/ ileoanal pouch reconstruction.   Discharge Diagnoses:  Principal Problem:   Adenocarcinoma of colon Active Problems:   Microcytic anemia   Leukocytosis   Nausea with vomiting   Colon polyps, numerous   CAP (community acquired pneumonia)  Discharge Condition: stable   Filed Weights   05/20/13 2011  Weight: 48.081 kg (106 lb)    History of present illness:  Cindy [name] is an 22 y.o. female who presented to [facility] with sore throat and periodic vomiting for a few days. Abdominal mass found on exam, CT abdomen and pelvis done which showed colonic mass/polypoid and severel wall thickening concerning for malignancy/lymphoma. Initially tachycardic, and received saline bolus. Patient has had weight loss of 20 pounds, but thought it was related to recent miscarriage. Has had nightsweats, weakness, vague cough. Also required recent admission for transfusion. Leukocytosis noted from 1/14. EDP initially called surgery to admit, who recommended admission to medicine for workup. Received single dose empiric zosyn. Sore throat now improved. Nausea improved after zofran. Asking for food.   Hospital Course:  GI and surgery consulted.  Colonoscopy showed ascending colon mass,  Endoscopically  unresectable soft polypoid masses in the transverse and descending  colon  Large pedunculated  rectal polyp approximately 2-1/2-3 cm, snared.  Multiple small  sessile less than 6 cm polyps scattered through all sections of the  colon including the rectum, raising the question of familial  Polyposis.  Pathology showed adenocarcinoma. CT chest for staging  showed no mass, but probable pneumonia. Patient likely had pneumonia on admission that was not seen on CXR. Started on levaquin and complete as outpatient. As patient is tolerating a diet, may be discharged and follow up with colorectal surgeon as outpatient when FAP result is back.  Procedures:  colonoscopy  Consultations:  GI, surgery  Discharge Exam: Filed Vitals:   05/26/13 0531 05/26/13 1430 05/26/13 2230 05/27/13 0551  BP: 104/59 121/69 125/74 113/66  Pulse: 100 146 142 128  Temp: 98.7 F (37.1 C) 100.3 F (37.9 C) 98.1 F (36.7 C) 99.7 F (37.6 C)  TempSrc: Oral Oral  Oral  Resp: 18 18 18 17   Height:      Weight:      SpO2: 100% 100% 100% 100%    General: comfortable Cardiovascular: RRR without MGR Respiratory: CTA without WRR Abd: soft, nontender, nondistended  Discharge Instructions  Discharge Orders   Future Appointments Provider Department Dept Phone   [phone] PM Leighton Ruff, MD [facility] Surgery, [place] [phone]   Future Orders Complete By Expires     Activity as tolerated - No restrictions  As directed     Diet general  As directed         Medication List    STOP taking these medications       ferrous sulfate 325 (65 FE) MG tablet      TAKE these medications       acetaminophen 325 MG tablet  Commonly known as:  TYLENOL  Take 2 tablets (650 mg total) by mouth every 6 (six) hours as needed for pain or fever.     diphenhydrAMINE 50 MG capsule  Commonly known as:  BENADRYL  Take 1 capsule (  50 mg total) by mouth every 6 (six) hours as needed for allergies.     fluticasone 50 MCG/ACT nasal spray  Commonly known as:  FLONASE  Place 2 sprays into the nose daily.     guaiFENesin 600 MG 12 hr tablet  Commonly known as:  MUCINEX  Take 1 tablet (600 mg total) by mouth 2 (two) times daily as needed for congestion.     HYDROcodone-acetaminophen 5-325 MG per tablet  Commonly known as:  NORCO/VICODIN  Take 1 tablet by mouth every 6  (six) hours as needed for pain.     levofloxacin 500 MG tablet  Commonly known as:  LEVAQUIN  Take 1 tablet (500 mg total) by mouth daily.     promethazine 12.5 MG tablet  Commonly known as:  PHENERGAN  Take 1 tablet (12.5 mg total) by mouth every 6 (six) hours as needed for nausea.       No Known Allergies     Follow-up Information   Follow up with Rosario Adie., MD On [id number]. (Your appointment is at 2:10am, please arrive at least 30 minutes before (1:40pm) your appointment to complete your check in paperwork.  If you are unable to arrive 30 minutes prior to your appointment time we may have to cancel or reschedule you.)    Contact information:   [place] [place] [address] [phone]        The results of significant diagnostics from this hospitalization (including imaging, microbiology, ancillary and laboratory) are listed below for reference.    Significant Diagnostic Studies: Dg Chest 2 View  05/21/2013   *RADIOLOGY REPORT*  Clinical Data: Cough.  Leukocytosis.  Colon mass.  CHEST - 2 VIEW  Comparison: None.  Findings: Borderline heart size.  The lungs are clear.  No airspace disease.  No effusion.  Stable prominence of pulmonary vessels seen on end on the lateral view.  IMPRESSION: Borderline heart size without acute cardiopulmonary disease.   Original Report Authenticated By: Dereck Ligas, M.D.   Ct Chest W Contrast  05/26/2013   *RADIOLOGY REPORT*  Clinical Data: Colon cancer staging  CT CHEST WITH CONTRAST  Technique:  Multidetector CT imaging of the chest was performed following the standard protocol during bolus administration of intravenous contrast.  Contrast: 27mL OMNIPAQUE IOHEXOL 300 MG/ML  SOLN  Comparison: Chest radiographs dated 05/21/2013  Findings: Mild patchy/ground-glass left lower lobe opacity (series 3/image 39), suspicious for pneumonia.  No suspicious pulmonary nodules. No pleural effusion or pneumothorax.  The visualized  thyroid is unremarkable.  The heart is normal in size.  No pericardial effusion.  No suspicious mediastinal, hilar, or axillary lymphadenopathy.  Visualized upper abdomen is unremarkable.  Visualized osseous structures are within normal limits.  IMPRESSION: Mild patchy left lower lobe opacity, suspicious for pneumonia.  No evidence of metastatic disease in the chest.   Original Report Authenticated By: Julian Hy, M.D.   US Transvaginal Non-ob  05/21/2013   *RADIOLOGY REPORT*  Clinical Data: Abdominal pain.  Evaluate right lower quadrant mass.  TRANSABDOMINAL AND TRANSVAGINAL ULTRASOUND OF PELVIS  Technique:  Both transabdominal and transvaginal ultrasound examinations of the pelvis were performed including evaluation of the uterus, ovaries, adnexal regions, and pelvic cul-de-sac.  Comparison: CT 11/09/2011  Findings:  Uterus:  6.9 x 3.3 x 4.4 cm. Normal echotexture.  No abnormality.  Endometrium: Normal appearance and thickness, 4 mm.  Right Ovary: 4.3 x 2.2 x 2.9 cm.  Dominant follicles in the right ovary, the largest 1.4  cm.  No adnexal masses.  Left Ovary: 2.7 x 1.5 x 2.2 cm.  Multiple small follicles.  Other Findings:  Small amount of free fluid in the pelvis.  IMPRESSION: Unremarkable exam.   Original Report Authenticated By: Rolm Baptise, M.D.   US Pelvis Complete  05/21/2013   *RADIOLOGY REPORT*  Clinical Data: Abdominal pain.  Evaluate right lower quadrant mass.  TRANSABDOMINAL AND TRANSVAGINAL ULTRASOUND OF PELVIS  Technique:  Both transabdominal and transvaginal ultrasound examinations of the pelvis were performed including evaluation of the uterus, ovaries, adnexal regions, and pelvic cul-de-sac.  Comparison: CT 11/09/2011  Findings:  Uterus:  6.9 x 3.3 x 4.4 cm. Normal echotexture.  No abnormality.  Endometrium: Normal appearance and thickness, 4 mm.  Right Ovary: 4.3 x 2.2 x 2.9 cm.  Dominant follicles in the right ovary, the largest 1.4 cm.  No adnexal masses.  Left Ovary: 2.7 x 1.5 x 2.2  cm.  Multiple small follicles.  Other Findings:  Small amount of free fluid in the pelvis.  IMPRESSION: Unremarkable exam.   Original Report Authenticated By: Rolm Baptise, M.D.   Ct Abdomen Pelvis W Contrast  05/21/2013   *RADIOLOGY REPORT*  Clinical Data: Right lower quadrant pain.  CT ABDOMEN AND PELVIS WITH CONTRAST  Technique:  Multidetector CT imaging of the abdomen and pelvis was performed following the standard protocol during bolus administration of intravenous contrast.  Contrast: 48mL OMNIPAQUE IOHEXOL 300 MG/ML  SOLN  Comparison: 11/09/2011  Findings: Heart is borderline in size.  Minimal linear ground-glass opacities in the left base, presumably atelectasis.  Right lung base clear.  No effusions.  Liver, gallbladder, spleen, pancreas, adrenals are unremarkable. Small bilateral nonobstructing renal stones.  Mild fullness of the right renal pelvis appears similar to prior study.  There is marked irregular wall thickening and mass involving the ascending colon and hepatic flexure.  Contrast is seen within the lumen of the colon at this level, at some places the lumen is mildly prominent and dilated.  Colonic wall measures as thick as 3.5 cm in places.  No evidence of bowel obstruction.  Mild wall thickening extends into the transverse colon.  There appears to be a large polypoid mass within the distal transverse colon on a stalk.  The polypoid mass measures maximally 4.0 cm on coronal image 47.  While infection/colitis could have this appearance, the constellation of findings is concerning for possible neoplasm. Lymphoma not excluded.  There are enlarged adjacent mesenteric lymph nodes.  Small bowel is decompressed.  Trace through the tip in the pelvis.  Uterus, adnexa urinary bladder are unremarkable.  No acute bony abnormality.  IMPRESSION: Large irregular mass involving the ascending colon and hepatic flexure of the colon.  This has a cobblestone appearance.  The lumen in areas is dilated.  There is  also a 4 cm mass within the distal transverse colon on a stalk.  Constellation of findings concerning for malignancy such as lymphoma.  There are adjacent enlarged mesenteric lymph nodes.  Bilateral nephrolithiasis.  Slight fullness of the right renal pelvis could be related to compression from the large colonic mass or mild chronic UPJ obstruction.  These results were called to Dr. Cheri Guppy at the time of interpretation.   Original Report Authenticated By: Rolm Baptise, M.D.    Microbiology: No results found for this or any previous visit (from the past 240 hour(s)).   Labs: Basic Metabolic Panel:  Recent Labs Lab 05/27/13 0455  NA 133*  K 4.6  CL 97  CO2  26  GLUCOSE 114*  BUN 9  CREATININE 0.52  CALCIUM 9.2   Liver Function Tests: No results found for this basename: AST, ALT, ALKPHOS, BILITOT, PROT, ALBUMIN,  in the last 168 hours No results found for this basename: LIPASE, AMYLASE,  in the last 168 hours No results found for this basename: AMMONIA,  in the last 168 hours CBC: No results found for this basename: WBC, NEUTROABS, HGB, HCT, MCV, PLT,  in the last 168 hours Cardiac Enzymes: No results found for this basename: CKTOTAL, CKMB, CKMBINDEX, TROPONINI,  in the last 168 hours BNP: BNP (last 3 results) No results found for this basename: PROBNP,  in the last 8760 hours CBG: No results found for this basename: GLUCAP,  in the last 168 hours   Signed:  Marinette L  [facility] 06/01/2013, 9:35 PM

## 2013-06-02 ENCOUNTER — Encounter: Payer: Self-pay | Admitting: Genetic Counselor

## 2013-06-02 ENCOUNTER — Telehealth (INDEPENDENT_AMBULATORY_CARE_PROVIDER_SITE_OTHER): Payer: Self-pay | Admitting: *Deleted

## 2013-06-02 ENCOUNTER — Encounter (INDEPENDENT_AMBULATORY_CARE_PROVIDER_SITE_OTHER): Payer: Self-pay | Admitting: General Surgery

## 2013-06-02 ENCOUNTER — Ambulatory Visit (INDEPENDENT_AMBULATORY_CARE_PROVIDER_SITE_OTHER): Payer: BC Managed Care – PPO | Admitting: General Surgery

## 2013-06-02 ENCOUNTER — Telehealth (INDEPENDENT_AMBULATORY_CARE_PROVIDER_SITE_OTHER): Payer: Self-pay

## 2013-06-02 VITALS — BP 112/68 | HR 118 | Temp 96.6°F | Resp 18 | Ht 61.0 in | Wt 92.6 lb

## 2013-06-02 DIAGNOSIS — C189 Malignant neoplasm of colon, unspecified: Secondary | ICD-10-CM

## 2013-06-02 NOTE — Telephone Encounter (Signed)
LATE ENTRY:    Dr Marcello Moores and I both spoke to Cindy [name] regarding genetic testing on this pt for FAP.  Cindy [name] recommends that instead of [place] sending her to [facility] that she would need further testing and the insurance may not cover it.  She offered to see her tomorrow at 1:30 or Wednesday at 10 am.  I called the pt right away and caught her at [facility] before they drew her blood.  I told her to cancel that and I offered her an appointment at one of those times with Cindy [name] at the [facility].  She took the one for tomorrow.  I notified Cindy [name].

## 2013-06-02 NOTE — Telephone Encounter (Signed)
I called [facility] to try to obtain the FAP lab results and they state they no longer have the specimen and they only ran the APC resistance.  Dr Marcello Moores said this is not the same and we are looking for a genetic result.  I notified Dr Marcello Moores.

## 2013-06-02 NOTE — Telephone Encounter (Signed)
I called [facility] to see which lab test to order for FAP.  I was transferred to a nice man in [place] whose name I forgot.  He told me they run the FAP mutation screening, full sequence and it's sent out to [facility].  He gave me the test # [phone] and said I should write that on the order sheet along with FAP.  I will send the pt to the lab with a written order sheet.

## 2013-06-02 NOTE — Progress Notes (Signed)
Chief Complaint  Patient presents with  . New Evaluation    multiple colon masses    HISTORY: Cindy [name] is a 22 y.o. female who presents to the office with a colon cancer found on colonoscopy.  She was admitted to the hospital last week with obstruction.  She was found to have multiple large polyps and a partially obstructing right sided mass.  Workup thus far has included a chest CT and CT A&P, which were negative for any signs of metastatic disease.    Past Medical History  Diagnosis Date  . UTI (lower urinary tract infection)   . Heart murmur   . Pneumonia 2008; 2012  . Complication of anesthesia     "I swallowed the anesthesia when appendix taken out; got pneumonia"  . Iron deficiency anemia   . History of blood transfusion 04/26/2013    "4 bags" (05/21/2013)  . Cancer     Colon      Past Surgical History  Procedure Laterality Date  . Leg reconstruction using fascial flap  ~ 2009  . Still born baby  03/22/13    delivered vaginally @ ~ 24 weeks  . Appendectomy  2008  . Colonoscopy N/A 05/24/2013    Procedure: COLONOSCOPY;  Surgeon: Missy Sabins, MD;  Location: [facility];  Service: Endoscopy;  Laterality: N/A;      Current Outpatient Prescriptions  Medication Sig Dispense Refill  . levofloxacin (LEVAQUIN) 500 MG tablet Take 1 tablet (500 mg total) by mouth daily.  6 tablet  0  . acetaminophen (TYLENOL) 325 MG tablet Take 2 tablets (650 mg total) by mouth every 6 (six) hours as needed for pain or fever.      . diphenhydrAMINE (BENADRYL) 50 MG capsule Take 1 capsule (50 mg total) by mouth every 6 (six) hours as needed for allergies.  30 capsule  0  . ferrous sulfate 325 (65 FE) MG tablet Take 325 mg by mouth 3 (three) times daily with meals.      . fluticasone (FLONASE) 50 MCG/ACT nasal spray Place 2 sprays into the nose daily.    2  . guaiFENesin (MUCINEX) 600 MG 12 hr tablet Take 1 tablet (600 mg total) by mouth 2 (two) times daily as needed for congestion.      Marland Kitchen  HYDROcodone-acetaminophen (NORCO/VICODIN) 5-325 MG per tablet Take 1 tablet by mouth every 6 (six) hours as needed for pain.  10 tablet  0  . promethazine (PHENERGAN) 12.5 MG tablet Take 1 tablet (12.5 mg total) by mouth every 6 (six) hours as needed for nausea.  10 tablet  0   No current facility-administered medications for this visit.      No Known Allergies    Family History  Problem Relation Age of Onset  . Alcohol abuse Neg Hx   . Arthritis Neg Hx   . Asthma Neg Hx   . Birth defects Neg Hx   . Cancer Neg Hx   . COPD Neg Hx   . Depression Neg Hx   . Diabetes Neg Hx   . Drug abuse Neg Hx   . Early death Neg Hx   . Hearing loss Neg Hx   . Heart disease Neg Hx   . Hyperlipidemia Neg Hx   . Hypertension Neg Hx   . Kidney disease Neg Hx   . Learning disabilities Neg Hx   . Mental illness Neg Hx   . Mental retardation Neg Hx   . Miscarriages / Stillbirths Neg  Hx   . Stroke Neg Hx   . Vision loss Neg Hx   . ALS Mother       History   Social History  . Marital Status: Single    Spouse Name: N/A    Number of Children: N/A  . Years of Education: N/A   Social History Main Topics  . Smoking status: Never Smoker   . Smokeless tobacco: Never Used  . Alcohol Use: No  . Drug Use: No  . Sexually Active: Not Currently    Birth Control/ Protection: Condom     Comment: pregnancy   Other Topics Concern  . None   Social History Narrative  . None       REVIEW OF SYSTEMS - PERTINENT POSITIVES ONLY: Review of Systems - General ROS: negative for - chills or fever Hematological and Lymphatic ROS: negative for - bleeding problems or blood clots Respiratory ROS: no cough, shortness of breath, or wheezing Cardiovascular ROS: no chest pain or dyspnea on exertion Gastrointestinal ROS: positive for - change in stools negative for - abdominal pain, gas/bloating or nausea/vomiting Genito-Urinary ROS: no dysuria, trouble voiding, or hematuria  EXAM: Filed Vitals:   06/02/13  1415  BP: 112/68  Pulse: 118  Temp: 96.6 F (35.9 C)  Resp: 18    Gen:  No acute distress.  Well nourished and well groomed.   Neurological: Alert and oriented to person, place, and time. Coordination normal.  Head: Normocephalic and atraumatic.  Eyes: Conjunctivae are normal. Pupils are equal, round, and reactive to light. No scleral icterus.  Neck: Normal range of motion. Neck supple. No tracheal deviation or thyromegaly present.  No cervical lymphadenopathy. Cardiovascular: Normal rate, regular rhythm, normal heart sounds and intact distal pulses.  Exam reveals no gallop and no friction rub.  No murmur heard. Respiratory: Effort normal.  No respiratory distress. No chest wall tenderness. Breath sounds normal.  No wheezes, rales or rhonchi.  GI: Soft. Bowel sounds are normal. The abdomen is soft and nontender.  There is no rebound and no guarding.  Musculoskeletal: Normal range of motion. Extremities are nontender.  Skin: Skin is warm and dry. No rash noted. No diaphoresis. No erythema. No pallor. No clubbing, cyanosis, or edema.   Psychiatric: Normal mood and affect. Behavior is normal. Judgment and thought content normal.    LABORATORY RESULTS: Available labs are reviewed  Alb 1.7-2.4  Lab Results  Component Value Date   CEA 1346.4* 05/26/2013     RADIOLOGY RESULTS:   Images and reports are reviewed. CT ABD IMPRESSION:  Large irregular mass involving the ascending colon and hepatic flexure of the colon. This has a cobblestone appearance. The lumen in areas is dilated. There is also a 4 cm mass within the distal transverse colon on a stalk. Constellation of findings concerning for malignancy such as lymphoma. There are adjacent enlarged mesenteric lymph nodes.  Bilateral nephrolithiasis. Slight fullness of the right renal pelvis could be related to compression from the large colonic mass or mild chronic UPJ obstruction. CT CHEST IMPRESSION:  Mild patchy left lower lobe opacity,  suspicious for pneumonia.  No evidence of metastatic disease in the chest.  ASSESSMENT AND PLAN: Cindy [name] is a 22 y.o. 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  I have discussed her case with Santiago Glad in genetics.  She offered to see her tomorrow for urgent evaluation.  We will await her FAP testing and in the meantime, I will have her continue to work on her  nutrition.  I discussed options that include cancer resection alone, TAC and leave the rectum in until after done having children or total proctocolectomy with j pouch and diverting ileostomy.  I have asked her to discuss her inability to tolerate her antibiotics with her PCP to see if there was an alternative treatment available or needed.  I will see her back in 3-4 weeks to plan surgery.  She will call us sooner if develops obstructive symptoms.  We will discuss her case further in multidisciplinary GI conference as well.      Rosario Adie, MD Colon and Rectal Surgery / [facility]      Visit Diagnoses: No diagnosis found.  Primary Care Physician: Vidal Schwalbe, MD

## 2013-06-02 NOTE — Patient Instructions (Addendum)
Start drinking more liquids.  Try using a nutritional supplement at least twice daily.  We will order the appropriate genetic test today.

## 2013-06-02 NOTE — Telephone Encounter (Signed)
Patient called to ask if Dr. Marcello Moores can call her cousin Dr. Clotilde Dieter to discuss her case.  Instructed patient that Dr. Marcello Moores would be fine calling him however she would need to come in to sign a HIPAA form placing his name as one of people to release medical information too.  Patient states understanding at this time.

## 2013-06-03 ENCOUNTER — Encounter: Payer: Self-pay | Admitting: Genetic Counselor

## 2013-06-03 ENCOUNTER — Encounter: Payer: Self-pay | Admitting: Oncology

## 2013-06-03 ENCOUNTER — Ambulatory Visit (HOSPITAL_BASED_OUTPATIENT_CLINIC_OR_DEPARTMENT_OTHER): Payer: BC Managed Care – PPO | Admitting: Genetic Counselor

## 2013-06-03 ENCOUNTER — Other Ambulatory Visit: Payer: Medicaid Other | Admitting: Lab

## 2013-06-03 DIAGNOSIS — K635 Polyp of colon: Secondary | ICD-10-CM

## 2013-06-03 DIAGNOSIS — IMO0002 Reserved for concepts with insufficient information to code with codable children: Secondary | ICD-10-CM

## 2013-06-03 DIAGNOSIS — C189 Malignant neoplasm of colon, unspecified: Secondary | ICD-10-CM

## 2013-06-03 NOTE — Progress Notes (Signed)
Dr.  Leighton Ruff requested a consultation for genetic counseling and risk assessment for Cindy [name], a 22 y.o. female, for discussion of her personal history of colon cancer.  She presents to clinic today to discuss the possibility of a genetic predisposition to cancer, and to further clarify her risks, as well as her family members' risks for cancer.   HISTORY OF PRESENT ILLNESS: In 2014, at the age of 28, Cindy [name] was diagnosed with colon cancer.  She has been found to have multiple large polyps and a partially obstructing mass. Thus far, all testing has been negative for metastatic disease.  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  This will be treated with surgery, but that is pending the genetic test results.    Past Medical History  Diagnosis Date  . UTI (lower urinary tract infection)   . Heart murmur   . Pneumonia 2008; 2012  . Complication of anesthesia     "I swallowed the anesthesia when appendix taken out; got pneumonia"  . Iron deficiency anemia   . History of blood transfusion 04/26/2013    "4 bags" (05/21/2013)  . Cancer 2014    Colon    Past Surgical History  Procedure Laterality Date  . Leg reconstruction using fascial flap  ~ 2009  . Still born baby  03/22/13    delivered vaginally @ ~ 24 weeks  . Appendectomy  2008  . Colonoscopy N/A 05/24/2013    Procedure: COLONOSCOPY;  Surgeon: Missy Sabins, MD;  Location: [facility];  Service: Endoscopy;  Laterality: N/A;    History   Social History  . Marital Status: Single    Spouse Name: N/A    Number of Children: N/A  . Years of Education: N/A   Social History Main Topics  . Smoking status: Never Smoker   . Smokeless tobacco: Never Used  . Alcohol Use: No  . Drug Use: No  . Sexually Active: Not Currently    Birth Control/ Protection: Condom     Comment: pregnancy   Other Topics Concern  . None   Social History Narrative  . None    REPRODUCTIVE HISTORY AND  PERSONAL RISK ASSESSMENT FACTORS: Colonoscopy: yes, found polyps and large tumor with signet ring features Menarche was at age 22.   premenopausal Uterus Intact: yes Ovaries Intact: yes G1P0A1, first live birth at age 53; Cindy [name] had a stillborn baby girl in April.  She has not previously undergone treatment for infertility.   Oral Contraceptive use: 7 years   She has not used HRT in the past.    FAMILY HISTORY:  We obtained a detailed, 4-generation family history.  Significant diagnoses are listed below: Family History  Problem Relation Age of Onset  . Adopted: Yes  . Alcohol abuse Neg Hx   . Arthritis Neg Hx   . Asthma Neg Hx   . Birth defects Neg Hx   . Cancer Neg Hx   . COPD Neg Hx   . Depression Neg Hx   . Diabetes Neg Hx   . Drug abuse Neg Hx   . Early death Neg Hx   . Hearing loss Neg Hx   . Heart disease Neg Hx   . Hyperlipidemia Neg Hx   . Hypertension Neg Hx   . Kidney disease Neg Hx   . Learning disabilities Neg Hx   . Mental illness Neg Hx   . Mental retardation Neg Hx   . Miscarriages /  Stillbirths Neg Hx   . Stroke Neg Hx   . Vision loss Neg Hx   . ALS Mother    The patient is adopted and reports that she does not know anything about her biological family.  GENETIC COUNSELING ASSESSMENT: Cindy [name] is a 22 y.o. female with a personal history of colon cancer which somewhat suggestive of familial adenomatous polyposis (FAP) or MYH-associated polyposis (MAP) and predisposition to cancer. We, therefore, discussed and recommended the following at today's visit.   DISCUSSION: We reviewed the characteristics, features and inheritance patterns of hereditary cancer syndromes. About 5-10% of colon cancer is hereditary.  Based on her early onset of colon cancer, and multiple polyps, we are concerned primarily for FAP or MAP, although other colon cancer syndromes could be implicated as well.  We also discussed genetic testing, the process of testing, insurance  coverage and turn-around-time for results. We recommended Cindy [name] pursue genetic testing for FAP, MYH mutations and other colon cancer syndromes at [facility]   PLAN: After considering the risks, benefits, and limitations, Cindy [name] provided informed consent to pursue genetic testing and the blood sample will be sent to [facility] for analysis of the [facility] AP MyRisk. We discussed the implications of a positive, negative and/ or variant of uncertain significance genetic test result. Results should be available within approximately 3 weeks' time, at which point they will be disclosed by telephone to Cindy [name], as will any additional her recommendations warranted by these results. Cindy [name] will receive a summary of her genetic counseling visit and a copy of her results once available. This information will also be available in Epic. We encouraged Cindy [name] to remain in contact with cancer genetics annually so that we can continuously update the family history and inform her of any changes in cancer genetics and testing that may be of benefit for her family. Cindy [name]'s questions were answered to her satisfaction today. Our contact information was provided should additional questions or concerns arise.   Per the patient's request, we will contact her by telephone to discuss these results. A follow up genetic counseling visit will be scheduled if indicated.  The patient was seen for a total of 30 minutes, greater than 50% of which was spent face-to-face counseling.  This plan is being carried out per Dr. Marcy [name] recommendations.  This note will also be sent to the referring provider via the electronic medical record. The patient will be supplied with a summary of this genetic counseling discussion as well as educational information on the discussed hereditary cancer syndromes following the conclusion of their visit.   Patient was discussed with Dr.  Marcy [name].   _______________________________________________________________________ For Office Staff:  Number of people involved in session: 1 Was an Intern/ student involved with case: no

## 2013-06-03 NOTE — Progress Notes (Signed)
Patient to know if she needed to get regular medicaid. She had pregnancy medicaid. I gave her an application. She gave ok to s/w with cousin [place]. She just wondered if the wait was 90days like in [facility], and advised her yes. I advised the patient to go ahead and check so it can get in the system prior to her surgery. She still has [place].

## 2013-06-04 ENCOUNTER — Telehealth (INDEPENDENT_AMBULATORY_CARE_PROVIDER_SITE_OTHER): Payer: Self-pay | Admitting: General Surgery

## 2013-06-04 NOTE — Telephone Encounter (Signed)
Discussed patient's treatment with her cousin who is a Stage manager.  Explained the issues regarding surgical decisions.  He will discuss this with her and get back to [place] with their decision.  AT

## 2013-06-06 ENCOUNTER — Encounter (INDEPENDENT_AMBULATORY_CARE_PROVIDER_SITE_OTHER): Payer: Self-pay | Admitting: General Surgery

## 2013-06-09 ENCOUNTER — Telehealth (INDEPENDENT_AMBULATORY_CARE_PROVIDER_SITE_OTHER): Payer: Self-pay | Admitting: General Surgery

## 2013-06-09 NOTE — Telephone Encounter (Signed)
I believe this is in reference to her Genetic blood tests.  The cancer center is managing that.  We await those results.

## 2013-06-09 NOTE — Telephone Encounter (Signed)
Patient calling stating that Dr Marcello Moores told her she would need her protein level checked prior to scheduling surgery. There is not an order in EPIC. Please advise. She would like to have drawn today. [id number].

## 2013-06-10 ENCOUNTER — Other Ambulatory Visit (INDEPENDENT_AMBULATORY_CARE_PROVIDER_SITE_OTHER): Payer: Self-pay | Admitting: *Deleted

## 2013-06-10 ENCOUNTER — Other Ambulatory Visit (INDEPENDENT_AMBULATORY_CARE_PROVIDER_SITE_OTHER): Payer: Self-pay | Admitting: General Surgery

## 2013-06-10 DIAGNOSIS — E440 Moderate protein-calorie malnutrition: Secondary | ICD-10-CM

## 2013-06-10 NOTE — Telephone Encounter (Signed)
Patient called back and was informed to go to [facility] to have labs drawn.  Patient agreeable at this time and will head over there now.

## 2013-06-10 NOTE — Telephone Encounter (Signed)
We were wanting to check her nutrition labs again.  Orders are in the computer.  I believe the family does not want to wait for the genetic tests and would like to proceed with a subtotal colectomy, but I will need to discuss that with Cindy [name].  She can go ahead and have the labs drawn, and I will call her once the results have returned.

## 2013-06-11 ENCOUNTER — Other Ambulatory Visit (INDEPENDENT_AMBULATORY_CARE_PROVIDER_SITE_OTHER): Payer: Self-pay | Admitting: General Surgery

## 2013-06-11 ENCOUNTER — Telehealth (INDEPENDENT_AMBULATORY_CARE_PROVIDER_SITE_OTHER): Payer: Self-pay

## 2013-06-11 ENCOUNTER — Telehealth (INDEPENDENT_AMBULATORY_CARE_PROVIDER_SITE_OTHER): Payer: Self-pay | Admitting: General Surgery

## 2013-06-11 ENCOUNTER — Other Ambulatory Visit: Payer: Self-pay | Admitting: Family Medicine

## 2013-06-11 NOTE — Telephone Encounter (Signed)
Pt calling back for the results again. I spoke to Clarise Cruz about the results and she stated that Dr Marcello Moores is planning on calling pt this afternoon when she gets done with her clinic to discuss the results. I advised pt that Dr Orest Dikes office did call to notify Dr Marcello Moores that the pt's hgb was low so she should be receiving a phone call from their office too. The pt understands.

## 2013-06-11 NOTE — Telephone Encounter (Signed)
Called patient to discuss results.  Prealbumin low, suggesting added PO nutrition not helping much.  We decided to proceed with open TAC and IRA.  We discussed the possibility of anastomotic leak, but I think her chances of healing are good.    The surgery and anatomy were described to the patient as well as the risks of surgery and the possible complications.  These include: Bleeding, infection and possible wound complications such as hernia, damage to adjacent structures, leak of surgical connections, which can lead to other surgeries and possibly an ostomy (5-7%), possible need for other procedures, such as abscess drains in radiology, possible prolonged hospital stay, possible diarrhea from removal of part of the colon, possible constipation from narcotics, prolonged fatigue/weakness or appetite loss, possible early recurrence of cancer, possible complications of their medical problems such as heart disease or arrhythmias or lung problems, death (less than 1%). I believe the patient understands and wishes to proceed with the surgery.  We will get her scheduled ASAP.

## 2013-06-11 NOTE — Telephone Encounter (Signed)
Rec'd a call from Dr Dema Severin that the pt's Hgb is 6.7 and wondered about transfusing her.  I spoke to Dr Marcello Moores who states it is fine.  Dr Dema Severin was notified and she said they will take care of that.

## 2013-06-11 NOTE — Telephone Encounter (Signed)
Pt calling for her lab results from yesterday. Pls call pt with results.

## 2013-06-12 ENCOUNTER — Emergency Department (HOSPITAL_COMMUNITY): Admission: EM | Admit: 2013-06-12 | Payer: BC Managed Care – PPO | Source: Home / Self Care

## 2013-06-12 ENCOUNTER — Other Ambulatory Visit: Payer: Self-pay | Admitting: Family Medicine

## 2013-06-12 ENCOUNTER — Encounter (HOSPITAL_COMMUNITY)
Admission: RE | Admit: 2013-06-12 | Discharge: 2013-06-12 | Disposition: A | Discharge status: Home / Self Care | Payer: BC Managed Care – PPO | Source: Ambulatory Visit | Attending: Family Medicine | Admitting: Family Medicine

## 2013-06-12 DIAGNOSIS — C189 Malignant neoplasm of colon, unspecified: Secondary | ICD-10-CM | POA: Insufficient documentation

## 2013-06-12 DIAGNOSIS — D509 Iron deficiency anemia, unspecified: Secondary | ICD-10-CM | POA: Insufficient documentation

## 2013-06-13 ENCOUNTER — Encounter (HOSPITAL_COMMUNITY): Payer: BC Managed Care – PPO

## 2013-06-13 ENCOUNTER — Ambulatory Visit (HOSPITAL_COMMUNITY)
Admission: RE | Admit: 2013-06-13 | Discharge: 2013-06-13 | Disposition: A | Discharge status: Home / Self Care | Payer: BC Managed Care – PPO | Source: Ambulatory Visit | Attending: Family Medicine | Admitting: Family Medicine

## 2013-06-13 ENCOUNTER — Encounter (HOSPITAL_COMMUNITY): Payer: Self-pay

## 2013-06-13 DIAGNOSIS — Z5189 Encounter for other specified aftercare: Secondary | ICD-10-CM | POA: Insufficient documentation

## 2013-06-13 DIAGNOSIS — Z0183 Encounter for blood typing: Secondary | ICD-10-CM | POA: Insufficient documentation

## 2013-06-13 MED ORDER — SODIUM CHLORIDE 0.9 % IV SOLN: 250.0000 mL | INTRAVENOUS | Status: DC

## 2013-06-14 LAB — TYPE AND SCREEN
ABO/RH(D): A POS
Antibody Screen: NEGATIVE
Unit division: 0

## 2013-06-18 ENCOUNTER — Telehealth (INDEPENDENT_AMBULATORY_CARE_PROVIDER_SITE_OTHER): Payer: Self-pay

## 2013-06-18 NOTE — Telephone Encounter (Signed)
I called the pt because I had mailed her bowel prep information to her but it came back undeliverable.  She said they are having trouble with their mailboxes so they can't get mail.  She asked me to mail it to her parents address.  I will mail it to [place], [place].

## 2013-06-19 ENCOUNTER — Encounter (INDEPENDENT_AMBULATORY_CARE_PROVIDER_SITE_OTHER): Payer: Self-pay

## 2013-06-20 ENCOUNTER — Encounter (INDEPENDENT_AMBULATORY_CARE_PROVIDER_SITE_OTHER): Payer: Self-pay

## 2013-06-23 ENCOUNTER — Encounter (HOSPITAL_COMMUNITY): Payer: Self-pay | Admitting: Pharmacy Technician

## 2013-06-23 ENCOUNTER — Encounter (INDEPENDENT_AMBULATORY_CARE_PROVIDER_SITE_OTHER): Payer: Self-pay

## 2013-06-27 ENCOUNTER — Telehealth (INDEPENDENT_AMBULATORY_CARE_PROVIDER_SITE_OTHER): Payer: Self-pay

## 2013-06-27 ENCOUNTER — Encounter (INDEPENDENT_AMBULATORY_CARE_PROVIDER_SITE_OTHER): Payer: Self-pay | Admitting: General Surgery

## 2013-06-27 ENCOUNTER — Telehealth (INDEPENDENT_AMBULATORY_CARE_PROVIDER_SITE_OTHER): Payer: Self-pay | Admitting: General Surgery

## 2013-06-27 NOTE — Patient Instructions (Signed)
Cindy [name]  06/27/2013   Your procedure is scheduled on: 07/03/13               Surgery [id number]   Report to [facility] at   [place]  AM.  Call this number if you have problems the morning of surgery: [phone]   Remember:   Do not eat food or drink liquids after midnight.   Take these medicines the morning of surgery with A SIP OF WATER:    Do not wear jewelry, make-up or nail polish.  Do not wear lotions, powders, or perfumes. .  Do not shave 48 hours prior to surgery.   Do not bring valuables to the hospital.  Contacts, dentures or bridgework may not be worn into surgery.  Leave suitcase in the car. After surgery it may be brought to your room.  For patients admitted to the hospital, checkout time is 11:00 AM the day of  discharge.       SEE CHG INSTRUCTION SHEET    Please read over the following fact sheets that you were given:  , coughing and deep breathing exercises, leg exercises               Failure to comply with these instructions may result in cancellation of your surgery.                Patient Signature ____________________________              Nurse Signature _____________________________

## 2013-06-27 NOTE — Telephone Encounter (Signed)
I was unable to reach the pt on her # so I called her dad's #.  When he heard who I was and where I was calling from he asked did I not know she already had surgery in [place].  I did not know.  He said she had been in pain and went for a 2nd opinion.  They have already done her surgery.  Her dad told me she had a bad experience with our office too because of all the confusion with the lab work.  He said he will have her call me.  I notified Katie in surgery scheduling to cancel her surgery.  Katie had been unable to reach her and her medicaid didn't go through anyway.

## 2013-06-27 NOTE — Telephone Encounter (Signed)
Received notice that patient has surgery at [facility]. Have been unable to reach pt address and # not correct

## 2013-06-30 ENCOUNTER — Telehealth: Payer: Self-pay | Admitting: Genetic Counselor

## 2013-06-30 ENCOUNTER — Inpatient Hospital Stay (HOSPITAL_COMMUNITY)
Admission: RE | Admit: 2013-06-30 | Discharge: 2013-06-30 | Disposition: A | Discharge status: Home / Self Care | Payer: BC Managed Care – PPO | Source: Ambulatory Visit

## 2013-06-30 NOTE — Telephone Encounter (Signed)
Left message on VM that we had test results back and to please call so we could go through them.

## 2013-06-30 NOTE — Telephone Encounter (Signed)
Left message that I was calling and to please call back.

## 2013-07-01 ENCOUNTER — Telehealth: Payer: Self-pay | Admitting: Genetic Counselor

## 2013-07-01 ENCOUNTER — Encounter (INDEPENDENT_AMBULATORY_CARE_PROVIDER_SITE_OTHER): Payer: BC Managed Care – PPO | Admitting: General Surgery

## 2013-07-01 ENCOUNTER — Encounter: Payer: Self-pay | Admitting: Genetic Counselor

## 2013-07-01 NOTE — Telephone Encounter (Signed)
Left voice mail message that we had results back and to please call so we could go through them.

## 2013-07-02 ENCOUNTER — Telehealth: Payer: Self-pay | Admitting: Genetic Counselor

## 2013-07-02 NOTE — Telephone Encounter (Signed)
Spoke with patients father that I had genetic test results back.  There was some confusion on whether these were part of her surgery that was scheduled or if this was something different.  He asked whether he could get the results.  I told him that since she had come in by herself, that I did not think that I could reveal results to him.  He took my name and number and told me that he would have her call me.

## 2013-07-03 ENCOUNTER — Encounter (HOSPITAL_COMMUNITY): Admission: RE | Payer: Self-pay | Source: Ambulatory Visit

## 2013-07-03 ENCOUNTER — Ambulatory Visit (HOSPITAL_COMMUNITY): Admission: RE | Admit: 2013-07-03 | Payer: BC Managed Care – PPO | Source: Ambulatory Visit | Admitting: General Surgery

## 2013-07-03 ENCOUNTER — Telehealth: Payer: Self-pay | Admitting: Genetic Counselor

## 2013-07-03 SURGERY — COLECTOMY, TOTAL
Anesthesia: General

## 2013-07-03 NOTE — Telephone Encounter (Signed)
Revealed positive result for FAP.  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  She had surgery a week ago Tuesday and is expected to get out tomorrow.  When she is feeling better and up to coming in I asked that she call and set up an appointment.

## 2013-07-22 ENCOUNTER — Encounter: Payer: Self-pay | Admitting: Genetic Counselor

## 2013-07-22 ENCOUNTER — Telehealth: Payer: Self-pay | Admitting: Genetic Counselor

## 2013-07-22 NOTE — Telephone Encounter (Signed)
Called Cindy [name] to set up a time to come in and talk about her genetic test results.  She will come in on Tuesday, 9/2 at 2 PM.

## 2013-07-23 ENCOUNTER — Telehealth: Payer: Self-pay | Admitting: Genetic Counselor

## 2013-07-23 NOTE — Telephone Encounter (Signed)
Per staff message from Santiago Glad added pt to her schedule 9/2 @ 2pm. Per Santiago Glad no lb and no need to call just add pt.

## 2013-08-01 ENCOUNTER — Encounter: Payer: Self-pay | Admitting: Genetic Counselor

## 2013-08-05 ENCOUNTER — Encounter (INDEPENDENT_AMBULATORY_CARE_PROVIDER_SITE_OTHER): Payer: Self-pay

## 2013-08-10 ENCOUNTER — Emergency Department (HOSPITAL_COMMUNITY)
Admission: EM | Admit: 2013-08-10 | Discharge: 2013-08-11 | Disposition: A | Discharge status: Home / Self Care | Payer: Medicaid Other | Attending: Emergency Medicine | Admitting: Emergency Medicine

## 2013-08-10 ENCOUNTER — Encounter (HOSPITAL_COMMUNITY): Payer: Self-pay | Admitting: Emergency Medicine

## 2013-08-10 DIAGNOSIS — R1110 Vomiting, unspecified: Secondary | ICD-10-CM | POA: Insufficient documentation

## 2013-08-10 DIAGNOSIS — R011 Cardiac murmur, unspecified: Secondary | ICD-10-CM | POA: Insufficient documentation

## 2013-08-10 DIAGNOSIS — R0989 Other specified symptoms and signs involving the circulatory and respiratory systems: Secondary | ICD-10-CM | POA: Insufficient documentation

## 2013-08-10 DIAGNOSIS — E860 Dehydration: Secondary | ICD-10-CM | POA: Insufficient documentation

## 2013-08-10 DIAGNOSIS — Z87440 Personal history of urinary (tract) infections: Secondary | ICD-10-CM | POA: Insufficient documentation

## 2013-08-10 DIAGNOSIS — T7840XA Allergy, unspecified, initial encounter: Secondary | ICD-10-CM

## 2013-08-10 DIAGNOSIS — R0609 Other forms of dyspnea: Secondary | ICD-10-CM | POA: Insufficient documentation

## 2013-08-10 DIAGNOSIS — Z862 Personal history of diseases of the blood and blood-forming organs and certain disorders involving the immune mechanism: Secondary | ICD-10-CM | POA: Insufficient documentation

## 2013-08-10 DIAGNOSIS — Z8701 Personal history of pneumonia (recurrent): Secondary | ICD-10-CM | POA: Insufficient documentation

## 2013-08-10 DIAGNOSIS — R21 Rash and other nonspecific skin eruption: Secondary | ICD-10-CM | POA: Insufficient documentation

## 2013-08-10 DIAGNOSIS — Z85038 Personal history of other malignant neoplasm of large intestine: Secondary | ICD-10-CM | POA: Insufficient documentation

## 2013-08-10 MED ORDER — PREDNISONE 20 MG PO TABS: ORAL_TABLET | ORAL | Status: DC

## 2013-08-10 MED ORDER — SODIUM CHLORIDE 0.9 % IV BOLUS (SEPSIS)
1000.0000 mL | Freq: Once | INTRAVENOUS | Status: AC
  Administered 2013-08-10: 1000 mL via INTRAVENOUS

## 2013-08-10 MED ORDER — ONDANSETRON HCL 4 MG/2ML IJ SOLN
4.0000 mg | Freq: Once | INTRAMUSCULAR | Status: AC
  Administered 2013-08-10: 4 mg via INTRAVENOUS
  Filled 2013-08-10: qty 2

## 2013-08-10 MED ORDER — PREDNISONE 20 MG PO TABS
60.0000 mg | ORAL_TABLET | Freq: Once | ORAL | Status: AC
  Administered 2013-08-10: 60 mg via ORAL
  Filled 2013-08-10: qty 3

## 2013-08-10 MED ORDER — SODIUM CHLORIDE 0.9 % IV BOLUS (SEPSIS): 1000.0000 mL | Freq: Once | INTRAVENOUS | Status: DC

## 2013-08-10 MED ORDER — RANITIDINE HCL 150 MG PO CAPS: 150.0000 mg | ORAL_CAPSULE | Freq: Every day | ORAL | Status: DC

## 2013-08-10 MED ORDER — FAMOTIDINE 20 MG PO TABS
20.0000 mg | ORAL_TABLET | Freq: Once | ORAL | Status: AC
  Administered 2013-08-10: 20 mg via ORAL
  Filled 2013-08-10: qty 1

## 2013-08-10 MED ORDER — EPINEPHRINE 0.3 MG/0.3ML IJ SOAJ: 0.3000 mg | INTRAMUSCULAR | Status: DC | PRN

## 2013-08-10 NOTE — ED Notes (Signed)
Pt c/o itching on arms and abdomen

## 2013-08-10 NOTE — ED Provider Notes (Signed)
CSN: [id number]     Arrival date & time 08/10/13  2105 History   First MD Initiated Contact with Patient 08/10/13 2218     Chief Complaint  Patient presents with  . Emesis  . Urticaria   (Consider location/radiation/quality/duration/timing/severity/associated sxs/prior Treatment) Patient is a 22 y.o. female presenting with allergic reaction. The history is provided by the patient. No language interpreter was used.  Allergic Reaction Presenting symptoms: difficulty breathing, itching and rash   Presenting symptoms: no swelling and no wheezing   Difficulty breathing:    Severity:  Mild   Onset quality:  Gradual   Timing:  Intermittent   Progression:  Resolved Itching:    Location:  Arm   Severity:  Mild   Onset quality:  Sudden   Timing:  Constant Rash:    Location:  Arm   Quality: itchiness     Severity:  Mild   Onset quality:  Sudden   Timing:  Constant   Progression:  Unchanged Severity:  Moderate Prior allergic episodes:  No prior episodes Context: no animal exposure, no chemicals, no cosmetics, no dairy/milk products, no eggs, no insect bite/sting, no medications, no new detergents/soaps and no nuts   Relieved by:  Antihistamines Worsened by:  Nothing tried Ineffective treatments:  None tried   Past Medical History  Diagnosis Date  . UTI (lower urinary tract infection)   . Heart murmur   . Pneumonia 2008; 2012  . Complication of anesthesia     "I swallowed the anesthesia when appendix taken out; got pneumonia"  . Iron deficiency anemia   . History of blood transfusion 04/26/2013    "4 bags" (05/21/2013)  . Cancer 2014    Colon   Past Surgical History  Procedure Laterality Date  . Leg reconstruction using fascial flap  ~ 2009  . Still born baby  03/22/13    delivered vaginally @ ~ 24 weeks  . Appendectomy  2008  . Colonoscopy N/A 05/24/2013    Procedure: COLONOSCOPY;  Surgeon: Missy Sabins, MD;  Location: [place] [facility];  Service: Endoscopy;  Laterality: N/A;  .  Colon surgery     Family History  Problem Relation Age of Onset  . Adopted: Yes  . Alcohol abuse Neg Hx   . Arthritis Neg Hx   . Asthma Neg Hx   . Birth defects Neg Hx   . Cancer Neg Hx   . COPD Neg Hx   . Depression Neg Hx   . Diabetes Neg Hx   . Drug abuse Neg Hx   . Early death Neg Hx   . Hearing loss Neg Hx   . Heart disease Neg Hx   . Hyperlipidemia Neg Hx   . Hypertension Neg Hx   . Kidney disease Neg Hx   . Learning disabilities Neg Hx   . Mental illness Neg Hx   . Mental retardation Neg Hx   . Miscarriages / Stillbirths Neg Hx   . Stroke Neg Hx   . Vision loss Neg Hx   . ALS Mother    History  Substance Use Topics  . Smoking status: Never Smoker   . Smokeless tobacco: Never Used  . Alcohol Use: No   OB History   Grav Para Term Preterm Abortions TAB SAB Ect Mult Living   1 1  1            Review of Systems  Constitutional: Negative for fever.  HENT: Negative for congestion, sore throat and rhinorrhea.   Respiratory:  Negative for cough, shortness of breath and wheezing.   Cardiovascular: Negative for chest pain.  Gastrointestinal: Negative for nausea, vomiting, abdominal pain and diarrhea.  Genitourinary: Negative for dysuria and hematuria.  Skin: Positive for itching and rash.  Neurological: Negative for syncope, light-headedness and headaches.  All other systems reviewed and are negative.    Allergies  Review of patient's allergies indicates no known allergies.  Home Medications   Current Outpatient Rx  Name  Route  Sig  Dispense  Refill  . loperamide (IMODIUM) 1 MG/5ML solution   Oral   Take 4 mg by mouth 4 (four) times daily as needed for diarrhea or loose stools. For diarrhea          BP 98/67  Pulse 141  Temp(Src) 98 F (36.7 C) (Oral)  Resp 16  SpO2 100%  LMP 07/22/2013 Physical Exam  Nursing note and vitals reviewed. Constitutional: She is oriented to person, place, and time. She appears well-developed and well-nourished.  HENT:   Head: Normocephalic and atraumatic.  Right Ear: External ear normal.  Left Ear: External ear normal.  Eyes: EOM are normal. Pupils are equal, round, and reactive to light.  Neck: Normal range of motion. Neck supple.  Cardiovascular: Normal rate, regular rhythm, normal heart sounds and intact distal pulses.  Exam reveals no gallop and no friction rub.   No murmur heard. Pulmonary/Chest: Effort normal and breath sounds normal. No respiratory distress. She has no wheezes. She has no rales. She exhibits no tenderness.  Abdominal: Soft. Bowel sounds are normal. She exhibits no distension. There is no tenderness. There is no rebound.  Ostomy no evidence of drainage, erythema, or infection  Musculoskeletal: Normal range of motion. She exhibits no edema and no tenderness.  Lymphadenopathy:    She has no cervical adenopathy.  Neurological: She is alert and oriented to person, place, and time.  Skin: Skin is warm. Rash noted.  Psychiatric: She has a normal mood and affect. Her behavior is normal.    ED Course  Procedures (including critical care time) Labs Review Labs Reviewed - No data to display Imaging Review No results found.  MDM   1. Allergic reaction, initial encounter   2. Dehydration    10:18 PM Pt is a 22 y.o. female with pertinent PMHX of Colon cancer s/p colectomy with placement of ostomy bag who presents to the ED with possible allergic reaction. Pt noted shortness of breath, rash to her arm and back which spread to her left arm. Shortness of breath has since resolved. Pt took benadryl earlier today. Unknown exposure. Pt denies food or drug allergies. Pt denies eating shellfish or peanuts. Pt denies changes in soaps, detergents or diet. Pt also had associated lightheadedness  On exam: AF, tachycardic. Pt having decreased PO intake. Plan to check orthostatics if orthostatic will give 1L bag of fluids. Small papules that are erythematous and itchy noted on Right upper and left  upper extremity. No stridor or wheezing noted. Given multiple systems: GI/Respiratory and skin plan to discharge with epi pen, steroids and zantac. Will give po dose of pepcid and prednisone in ED.  Pt noted symptomatic lightheadedness on sitting and standing will give 1L bolus and re-eval. On re-eval at 12am pt's heart rate 97. Pt feeling subjectively better. Plan for discharge.  11:06 PM:  I have discussed the diagnosis/risks/treatment options with the patient and believe the pt to be eligible for discharge home to follow-up with PCP for possible skin testing. We also discussed returning  to the ED immediately if new or worsening sx occur. We discussed the sx which are most concerning (e.g., anaphylaxis symptoms) that necessitate immediate return. Any new prescriptions provided to the patient are listed below.   New Prescriptions   EPINEPHRINE (EPIPEN) 0.3 MG/0.3 ML SOAJ INJECTION    Inject 0.3 mLs (0.3 mg total) into the muscle as needed.   PREDNISONE (DELTASONE) 20 MG TABLET    3 tabs po daily x 3 days, then 2 tabs x 3 days, then 1.5 tabs x 3 days, then 1 tab x 3 days, then 0.5 tabs x 3 days   RANITIDINE (ZANTAC) 150 MG CAPSULE    Take 1 capsule (150 mg total) by mouth daily.    The patient appears reasonably screened and/or stabilized for discharge and I doubt any other medical condition or other [facility] requiring further screening, evaluation or treatment in the ED at this time prior to discharge . Pt in agreement with discharge plan. Return precautions given. Pt discharged VSS  Pt was discussed with my attending, Dr. Caryl Ada, MD 08/10/13 2357  Eyes personally saw and evaluated this patient. I I agree with Dr. Aris Everts assessment and plan above. This seems to be an urticarial reaction. She still felt dizzy and was a bit tachycardic therefore was given a liter of IV fluid. She is not show signs of this being anaphylaxis she has clear lungs her rash is minimal nearly resolved  upon the time of her arrival. She is feeling much better after the above. I think her dizziness was due to poor by mouth intake and  her ostomy output.  Lolita Patella, MD 08/12/13 [phone]

## 2013-08-10 NOTE — ED Notes (Signed)
C/o hives all over, vomiting, and feeling lightheaded since this morning.  No known allergies.

## 2013-08-28 ENCOUNTER — Encounter: Payer: BC Managed Care – PPO | Admitting: Genetic Counselor

## 2013-09-22 ENCOUNTER — Inpatient Hospital Stay (HOSPITAL_COMMUNITY)
Admission: EM | Admit: 2013-09-22 | Discharge: 2013-09-23 | DRG: 683 | Disposition: A | Discharge status: Home / Self Care | Payer: Medicaid Other | Attending: Internal Medicine | Admitting: Internal Medicine

## 2013-09-22 ENCOUNTER — Encounter (HOSPITAL_COMMUNITY): Payer: Self-pay | Admitting: Emergency Medicine

## 2013-09-22 ENCOUNTER — Encounter: Payer: BC Managed Care – PPO | Admitting: Genetic Counselor

## 2013-09-22 ENCOUNTER — Emergency Department (HOSPITAL_COMMUNITY)
Admission: EM | Admit: 2013-09-22 | Discharge: 2013-09-22 | Disposition: A | Discharge status: Home / Self Care | Payer: Medicaid Other | Source: Home / Self Care

## 2013-09-22 DIAGNOSIS — IMO0002 Reserved for concepts with insufficient information to code with codable children: Secondary | ICD-10-CM

## 2013-09-22 DIAGNOSIS — J189 Pneumonia, unspecified organism: Secondary | ICD-10-CM

## 2013-09-22 DIAGNOSIS — R0981 Nasal congestion: Secondary | ICD-10-CM

## 2013-09-22 DIAGNOSIS — C189 Malignant neoplasm of colon, unspecified: Secondary | ICD-10-CM

## 2013-09-22 DIAGNOSIS — N390 Urinary tract infection, site not specified: Secondary | ICD-10-CM | POA: Diagnosis present

## 2013-09-22 DIAGNOSIS — R112 Nausea with vomiting, unspecified: Secondary | ICD-10-CM

## 2013-09-22 DIAGNOSIS — R55 Syncope and collapse: Secondary | ICD-10-CM | POA: Diagnosis present

## 2013-09-22 DIAGNOSIS — K6389 Other specified diseases of intestine: Secondary | ICD-10-CM

## 2013-09-22 DIAGNOSIS — E860 Dehydration: Secondary | ICD-10-CM | POA: Diagnosis present

## 2013-09-22 DIAGNOSIS — K635 Polyp of colon: Secondary | ICD-10-CM

## 2013-09-22 DIAGNOSIS — Z79899 Other long term (current) drug therapy: Secondary | ICD-10-CM

## 2013-09-22 DIAGNOSIS — Z85038 Personal history of other malignant neoplasm of large intestine: Secondary | ICD-10-CM | POA: Diagnosis present

## 2013-09-22 DIAGNOSIS — D509 Iron deficiency anemia, unspecified: Secondary | ICD-10-CM

## 2013-09-22 DIAGNOSIS — Z932 Ileostomy status: Secondary | ICD-10-CM

## 2013-09-22 DIAGNOSIS — E876 Hypokalemia: Secondary | ICD-10-CM

## 2013-09-22 DIAGNOSIS — R0602 Shortness of breath: Secondary | ICD-10-CM | POA: Diagnosis present

## 2013-09-22 DIAGNOSIS — D72829 Elevated white blood cell count, unspecified: Secondary | ICD-10-CM

## 2013-09-22 DIAGNOSIS — N179 Acute kidney failure, unspecified: Secondary | ICD-10-CM

## 2013-09-22 LAB — URINALYSIS, ROUTINE W REFLEX MICROSCOPIC
Glucose, UA: NEGATIVE mg/dL
Ketones, ur: NEGATIVE mg/dL
Protein, ur: 30 mg/dL — AB

## 2013-09-22 LAB — CBC WITH DIFFERENTIAL/PLATELET
Eosinophils Absolute: 0.2 10*3/uL (ref 0.0–0.7)
Eosinophils Relative: 1 % (ref 0–5)
Lymphs Abs: 3.7 10*3/uL (ref 0.7–4.0)
MCH: 27.1 pg (ref 26.0–34.0)
MCV: 78.3 fL (ref 78.0–100.0)
Monocytes Relative: 7 % (ref 3–12)
Platelets: 398 10*3/uL (ref 150–400)
RBC: 5.21 MIL/uL — ABNORMAL HIGH (ref 3.87–5.11)

## 2013-09-22 LAB — POCT I-STAT, CHEM 8
BUN: 27 mg/dL — ABNORMAL HIGH (ref 6–23)
Calcium, Ion: 1.24 mmol/L — ABNORMAL HIGH (ref 1.12–1.23)
Creatinine, Ser: 1.7 mg/dL — ABNORMAL HIGH (ref 0.50–1.10)
TCO2: 16 mmol/L (ref 0–100)

## 2013-09-22 LAB — URINE MICROSCOPIC-ADD ON

## 2013-09-22 MED ORDER — ALBUTEROL SULFATE (5 MG/ML) 0.5% IN NEBU: 2.5000 mg | INHALATION_SOLUTION | RESPIRATORY_TRACT | Status: DC | PRN

## 2013-09-22 MED ORDER — ONDANSETRON 4 MG PO TBDP
8.0000 mg | ORAL_TABLET | Freq: Once | ORAL | Status: AC
  Administered 2013-09-22: 8 mg via ORAL
  Filled 2013-09-22: qty 2

## 2013-09-22 MED ORDER — SODIUM CHLORIDE 0.9 % IV BOLUS (SEPSIS)
1000.0000 mL | Freq: Once | INTRAVENOUS | Status: AC
  Administered 2013-09-22: 1000 mL via INTRAVENOUS

## 2013-09-22 MED ORDER — POTASSIUM CHLORIDE 10 MEQ/100ML IV SOLN
10.0000 meq | INTRAVENOUS | Status: AC
  Administered 2013-09-22 – 2013-09-23 (×2): 10 meq via INTRAVENOUS
  Filled 2013-09-22 (×2): qty 100

## 2013-09-22 MED ORDER — POTASSIUM CHLORIDE 20 MEQ/15ML (10%) PO LIQD
40.0000 meq | Freq: Once | ORAL | Status: AC
  Administered 2013-09-22: 40 meq via ORAL
  Filled 2013-09-22: qty 30

## 2013-09-22 NOTE — H&P (Signed)
[facility] History and Physical  Cindy [name] [id number] DOB: 1991-04-26 DOA: 09/22/2013  Referring physician: ED physician PCP: Vidal Schwalbe, MD   Chief Complaint: wewakness, shortness of breath  HPI:  22 year old female with past medical history significant for colon carcinoma and ostomy bag placement who presented to [facility] ED with feeling weak, dizzy and short of breath for past few days prior to this admission. Patient reported having dizzy spells but no associated loss of consciousness. Pt also reported somewhat increased ostomy output but with no blood in stool. No fever or chills. No abdominal pain but she did endorse nausea. No chest pain and no palpitations.  ed urinary output , near syncopal..  In ED, BP was 91/54 and HR 89 - 105 but with IV fluids BP was 106/69. O2 saturation was 99 % on room air. BMP revealed potassium of 2.6 which was repelted with total of 60 meq of potassium in ED. Creatinine was 1.7 on admission.   Assessment and Plan:  Principal Problem: Hypokalemia - secondary to GI losses - repelted in ED and will continue IV fluids with potassium supplementation - follow up magnesium level - follow up BMP in am  Active Problems: Acute renal failure - secondary to dehydration - continue IV fluids - follow up BMP in am Dehydration - continue IV fluids     Code Status: Full Family Communication: Pt at bedside Disposition Plan: PT evaluation    Review of Systems:  Constitutional: Negative for fever, chills and positive for malaise/fatigue. Negative for diaphoresis.  HENT: Negative for hearing loss, ear pain, nosebleeds, congestion, sore throat, neck pain, tinnitus and ear discharge.   Eyes: Negative for blurred vision, double vision, photophobia, pain, discharge and redness.  Respiratory: Negative for cough, hemoptysis, sputum production, shortness of breath, wheezing and stridor.   Cardiovascular: Negative for chest pain, palpitations,  orthopnea, claudication and leg swelling.  Gastrointestinal: positive for nausea, no vomiting or abdominal pain. Negative for heartburn, constipation, blood in stool and melena.  Genitourinary: Negative for dysuria, urgency, frequency, hematuria and flank pain.  Musculoskeletal: Negative for myalgias, back pain, joint pain and falls.  Skin: Negative for itching and rash.  Neurological: positive for dizziness and weakness. Negative for tingling, tremors, sensory change, speech change, focal weakness, loss of consciousness and headaches.  Endo/Heme/Allergies: Negative for environmental allergies and polydipsia. Does not bruise/bleed easily.  Psychiatric/Behavioral: Negative for suicidal ideas. The patient is not nervous/anxious.      Past Medical History  Diagnosis Date  . UTI (lower urinary tract infection)   . Heart murmur   . Pneumonia 2008; 2012  . Complication of anesthesia     "I swallowed the anesthesia when appendix taken out; got pneumonia"  . Iron deficiency anemia   . History of blood transfusion 04/26/2013    "4 bags" (05/21/2013)  . Cancer 2014    Colon    Past Surgical History  Procedure Laterality Date  . Leg reconstruction using fascial flap  ~ 2009  . Still born baby  03/22/13    delivered vaginally @ ~ 24 weeks  . Appendectomy  2008  . Colonoscopy N/A 05/24/2013    Procedure: COLONOSCOPY;  Surgeon: Missy Sabins, MD;  Location: [place];  Service: Endoscopy;  Laterality: N/A;  . Colon surgery      Social History:  reports that she has never smoked. She has never used smokeless tobacco. She reports that she does not drink alcohol or use illicit drugs.  No Known Allergies  Family History  Problem Relation Age of Onset  . Adopted: Yes  . Alcohol abuse Neg Hx   . Arthritis Neg Hx   . Asthma Neg Hx   . Birth defects Neg Hx   . Cancer Neg Hx   . COPD Neg Hx   . Depression Neg Hx   . Diabetes Neg Hx   . Drug abuse Neg Hx   . Early death Neg Hx   . Hearing  loss Neg Hx   . Heart disease Neg Hx   . Hyperlipidemia Neg Hx   . Hypertension Neg Hx   . Kidney disease Neg Hx   . Learning disabilities Neg Hx   . Mental illness Neg Hx   . Mental retardation Neg Hx   . Miscarriages / Stillbirths Neg Hx   . Stroke Neg Hx   . Vision loss Neg Hx   . ALS Mother     Prior to Admission medications   Medication Sig Start Date End Date Taking? Authorizing Provider  loperamide (IMODIUM) 1 MG/5ML solution Take 3 mg by mouth 4 (four) times daily as needed for diarrhea or loose stools.   Yes Historical Provider, MD  EPINEPHrine (EPIPEN) 0.3 mg/0.3 mL SOAJ injection Inject 0.3 mLs (0.3 mg total) into the muscle as needed. 08/10/13   Joanell Rising, MD    Physical Exam: Filed Vitals:   09/22/13 1949 09/22/13 1959 09/22/13 2130  BP: 95/56  92/59  Pulse: 105    Temp: 97.8 F (36.6 C) 98.3 F (36.8 C) 98.1 F (36.7 C)  TempSrc: Oral Oral Oral  Resp: 16  16  Height: 5\' 1"  (1.549 m)    Weight: 42.666 kg (94 lb 1 oz)    SpO2: 100%  100%    Physical Exam  Constitutional: Appears ill but no acute distress HENT: Normocephalic. External right and left ear normal. Dry mucus membranes Eyes: Conjunctivae and EOM are normal. PERRLA, no scleral icterus.  Neck: Normal ROM. Neck supple. No JVD. No tracheal deviation. No thyromegaly.  CVS: RRR, S1/S2 appreciated Pulmonary: Effort and breath sounds normal, no stridor, rhonchi, wheezes, rales.  Abdominal: Soft. BS +,  no distension, tenderness, rebound, (+) osotomy Musculoskeletal: Normal range of motion. No edema and no tenderness.  Lymphadenopathy: No lymphadenopathy noted, cervical, inguinal. Neuro: Alert. Normal reflexes, muscle tone coordination. No cranial nerve deficit. Skin: Skin is warm and dry. No rash noted. Not diaphoretic. No erythema. No pallor.  Psychiatric: Normal mood and affect. Behavior, judgment, thought content normal.   Labs on Admission:  Basic Metabolic Panel:  Recent Labs Lab  09/22/13 2040  NA 133*  K 2.6*  CL 103  GLUCOSE 94  BUN 27*  CREATININE 1.70*   Liver Function Tests: No results found for this basename: AST, ALT, ALKPHOS, BILITOT, PROT, ALBUMIN,  in the last 168 hours No results found for this basename: LIPASE, AMYLASE,  in the last 168 hours No results found for this basename: AMMONIA,  in the last 168 hours CBC:  Recent Labs Lab 09/22/13 2015 09/22/13 2040  WBC 10.4  --   NEUTROABS 5.8  --   HGB 14.1 15.3*  HCT 40.8 45.0  MCV 78.3  --   PLT 398  --    Cardiac Enzymes: No results found for this basename: CKTOTAL, CKMB, CKMBINDEX, TROPONINI,  in the last 168 hours BNP: No components found with this basename: POCBNP,  CBG: No results found for this basename: GLUCAP,  in the last 168 hours  Radiological  Exams on Admission: No results found.   Faye Ramsay, MD  [facility] Pager [phone]  If 7PM-7AM, please contact night-coverage [email or web address] Password [email or web address] 09/22/2013, 11:16 PM

## 2013-09-22 NOTE — ED Provider Notes (Addendum)
CSN: [id number]     Arrival date & time 09/22/13  1939 History   None    Chief Complaint  Patient presents with  . Shortness of Breath  . Near Syncope  . Dehydration   (Consider location/radiation/quality/duration/timing/severity/associated sxs/prior Treatment) HPI  Past Medical History  Diagnosis Date  . UTI (lower urinary tract infection)   . Heart murmur   . Pneumonia 2008; 2012  . Complication of anesthesia     "I swallowed the anesthesia when appendix taken out; got pneumonia"  . Iron deficiency anemia   . History of blood transfusion 04/26/2013    "4 bags" (05/21/2013)  . Cancer 2014    Colon   Past Surgical History  Procedure Laterality Date  . Leg reconstruction using fascial flap  ~ 2009  . Still born baby  03/22/13    delivered vaginally @ ~ 24 weeks  . Appendectomy  2008  . Colonoscopy N/A 05/24/2013    Procedure: COLONOSCOPY;  Surgeon: Missy Sabins, MD;  Location: [place];  Service: Endoscopy;  Laterality: N/A;  . Colon surgery     Family History  Problem Relation Age of Onset  . Adopted: Yes  . Alcohol abuse Neg Hx   . Arthritis Neg Hx   . Asthma Neg Hx   . Birth defects Neg Hx   . Cancer Neg Hx   . COPD Neg Hx   . Depression Neg Hx   . Diabetes Neg Hx   . Drug abuse Neg Hx   . Early death Neg Hx   . Hearing loss Neg Hx   . Heart disease Neg Hx   . Hyperlipidemia Neg Hx   . Hypertension Neg Hx   . Kidney disease Neg Hx   . Learning disabilities Neg Hx   . Mental illness Neg Hx   . Mental retardation Neg Hx   . Miscarriages / Stillbirths Neg Hx   . Stroke Neg Hx   . Vision loss Neg Hx   . ALS Mother    History  Substance Use Topics  . Smoking status: Never Smoker   . Smokeless tobacco: Never Used  . Alcohol Use: No   OB History   Grav Para Term Preterm Abortions TAB SAB Ect Mult Living   1 1  1            Review of Systems  Allergies  Review of patient's allergies indicates no known allergies.  Home Medications   Current  Outpatient Rx  Name  Route  Sig  Dispense  Refill  . EPINEPHrine (EPIPEN) 0.3 mg/0.3 mL SOAJ injection   Intramuscular   Inject 0.3 mLs (0.3 mg total) into the muscle as needed.   2 Device   0   . loperamide (IMODIUM) 1 MG/5ML solution   Oral   Take 4 mg by mouth 4 (four) times daily as needed for diarrhea or loose stools. For diarrhea         . predniSONE (DELTASONE) 20 MG tablet      3 tabs po daily x 3 days, then 2 tabs x 3 days, then 1.5 tabs x 3 days, then 1 tab x 3 days, then 0.5 tabs x 3 days   27 tablet   0   . ranitidine (ZANTAC) 150 MG capsule   Oral   Take 1 capsule (150 mg total) by mouth daily.   30 capsule   0    BP 95/56  Pulse 105  Temp(Src) 98.3 F (36.8 C) (Oral)  Resp 16  Ht 5\' 1"  (1.549 m)  Wt 94 lb 1 oz (42.666 kg)  BMI 17.78 kg/m2  SpO2 100%  LMP 08/27/2013 Physical Exam  ED Course  Procedures (including critical care time) Labs Review Labs Reviewed  CBC WITH DIFFERENTIAL - Abnormal; Notable for the following:    RBC 5.21 (*)    All other components within normal limits  POCT I-STAT, CHEM 8 - Abnormal; Notable for the following:    Sodium 133 (*)    Potassium 2.6 (*)    BUN 27 (*)    Creatinine, Ser 1.70 (*)    Calcium, Ion 1.24 (*)    Hemoglobin 15.3 (*)    All other components within normal limits  URINALYSIS, ROUTINE W REFLEX MICROSCOPIC   Imaging Review No results found.  EKG Interpretation   None       MDM  No diagnosis found.      Billy Fischer, MD 09/22/13 2120  Billy Fischer, MD 09/22/13 2121

## 2013-09-22 NOTE — ED Notes (Signed)
Has an ostomy bag. Reports increased ostomy out put, "feels dehydrated", also sob, dizzy, nausea, weakness, (denies: fever, pain, vomiting, bleeding), last ate 1630, last drink 1630. VS noted.

## 2013-09-22 NOTE — ED Provider Notes (Signed)
CSN: [id number]     Arrival date & time 09/22/13  1939 History   First MD Initiated Contact with Patient 09/22/13 2146     Chief Complaint  Patient presents with  . Shortness of Breath  . Near Syncope  . Dehydration   The history is provided by the patient. No language interpreter was used.   Cindy [name] is a 22 year old Caucasian female with past medical history of colon cancer resulting in a colectomy approximately 4 years ago who comes emergency department today with dehydration and high output from her ostomy. She has been admitted to the hospital many times for similar complaint. Yesterday she began having a large amount of output from her ostomy. She normally changes her ostomy through 4 times a day. And has had to change it every 30 minutes for the past 48 hours. Today she began feeling weak and dizzy a result she came to the emergency department. She denies abdominal pain, fevers, chills, dysuria, frequency, or urgency. She does report some nausea but has not vomited.  Past Medical History  Diagnosis Date  . UTI (lower urinary tract infection)   . Heart murmur   . Pneumonia 2008; 2012  . Complication of anesthesia     "I swallowed the anesthesia when appendix taken out; got pneumonia"  . Iron deficiency anemia   . History of blood transfusion 04/26/2013    "4 bags" (05/21/2013)  . Cancer 2014    Colon   Past Surgical History  Procedure Laterality Date  . Leg reconstruction using fascial flap  ~ 2009  . Still born baby  03/22/13    delivered vaginally @ ~ 24 weeks  . Appendectomy  2008  . Colonoscopy N/A 05/24/2013    Procedure: COLONOSCOPY;  Surgeon: Missy Sabins, MD;  Location: [facility];  Service: Endoscopy;  Laterality: N/A;  . Colon surgery     Family History  Problem Relation Age of Onset  . Adopted: Yes  . Alcohol abuse Neg Hx   . Arthritis Neg Hx   . Asthma Neg Hx   . Birth defects Neg Hx   . Cancer Neg Hx   . COPD Neg Hx   . Depression Neg Hx   . Diabetes  Neg Hx   . Drug abuse Neg Hx   . Early death Neg Hx   . Hearing loss Neg Hx   . Heart disease Neg Hx   . Hyperlipidemia Neg Hx   . Hypertension Neg Hx   . Kidney disease Neg Hx   . Learning disabilities Neg Hx   . Mental illness Neg Hx   . Mental retardation Neg Hx   . Miscarriages / Stillbirths Neg Hx   . Stroke Neg Hx   . Vision loss Neg Hx   . ALS Mother    History  Substance Use Topics  . Smoking status: Never Smoker   . Smokeless tobacco: Never Used  . Alcohol Use: No   OB History   Grav Para Term Preterm Abortions TAB SAB Ect Mult Living   1 1  1            Review of Systems  Constitutional: Negative for fever and chills.  Respiratory: Negative for cough and shortness of breath.   Gastrointestinal: Negative for nausea, vomiting, abdominal pain, diarrhea, constipation and abdominal distention.  Genitourinary: Negative for dysuria, urgency, frequency, decreased urine volume, vaginal bleeding and vaginal discharge.  Skin: Negative for color change.  Neurological: Positive for dizziness. Negative for weakness  and numbness.  All other systems reviewed and are negative.    Allergies  Review of patient's allergies indicates no known allergies.  Home Medications   Current Outpatient Rx  Name  Route  Sig  Dispense  Refill  . loperamide (IMODIUM) 1 MG/5ML solution   Oral   Take 3 mg by mouth 4 (four) times daily as needed for diarrhea or loose stools.         Marland Kitchen EPINEPHrine (EPIPEN) 0.3 mg/0.3 mL SOAJ injection   Intramuscular   Inject 0.3 mLs (0.3 mg total) into the muscle as needed.   2 Device   0    BP 106/69  Pulse 89  Temp(Src) 98.1 F (36.7 C) (Oral)  Resp 19  Ht 5\' 1"  (1.549 m)  Wt 94 lb 1 oz (42.666 kg)  BMI 17.78 kg/m2  SpO2 99%  LMP 08/27/2013  Physical Exam  Nursing note and vitals reviewed. Constitutional: She is oriented to person, place, and time. She appears well-developed and well-nourished. No distress.  HENT:  Head: Normocephalic  and atraumatic.  Eyes: Pupils are equal, round, and reactive to light.  Neck: Normal range of motion.  Cardiovascular: Normal rate, regular rhythm, normal heart sounds and intact distal pulses.   Pulmonary/Chest: Effort normal. No respiratory distress. She has no wheezes. She exhibits no tenderness.  Abdominal: Soft. Bowel sounds are normal. She exhibits no distension. There is no tenderness. There is no rebound and no guarding.  Colostomy with liquid watery stool in bag.    Neurological: She is alert and oriented to person, place, and time. She has normal strength. No cranial nerve deficit or sensory deficit. She exhibits normal muscle tone. Coordination and gait normal.  Skin: Skin is warm and dry.    ED Course  Procedures (including critical care time) Labs Review Labs Reviewed  CBC WITH DIFFERENTIAL - Abnormal; Notable for the following:    RBC 5.21 (*)    All other components within normal limits  URINALYSIS, ROUTINE W REFLEX MICROSCOPIC - Abnormal; Notable for the following:    APPearance CLOUDY (*)    Hgb urine dipstick LARGE (*)    Protein, ur 30 (*)    Leukocytes, UA LARGE (*)    All other components within normal limits  URINE MICROSCOPIC-ADD ON - Abnormal; Notable for the following:    Squamous Epithelial / LPF MANY (*)    Bacteria, UA MANY (*)    Casts HYALINE CASTS (*)    All other components within normal limits  POCT I-STAT, CHEM 8 - Abnormal; Notable for the following:    Sodium 133 (*)    Potassium 2.6 (*)    BUN 27 (*)    Creatinine, Ser 1.70 (*)    Calcium, Ion 1.24 (*)    Hemoglobin 15.3 (*)    All other components within normal limits  URINE CULTURE   Imaging Review No results found.  EKG Interpretation   None       MDM  Cindy [name] is a 22 year old Caucasian female with past medical history of colostomy and colon cancer who comes emergency department today with concern for dehydration. Physical exam as above. With no abdominal pain and a benign  abdominal exam shows not felt to require abdominal imaging. Initial workup included an i-STAT chem 8, CBC, and a UA. I-STAT chem 8 demonstrated a creatinine of 1.7 which is up from her baseline of 0.5. CBC had a hemoglobin of 14 which is up from baseline of 7. This is  concerning for severe dehydration. In addition potassium was low at 2.6. Dispense potassium was replaced using IV and by mouth potassium. She was administered a liter of normal saline IV. Cindy [name] was felt to require admission to the hospital for dehydration secondary to high output ostomy. Cindy [name] was admitted to the hospitalist service in stable condition. Labs reviewed by myself and considered and medical decision-making. Care was discussed with my attending Dr. Roxanne Mins.  1. Adenocarcinoma of colon   2. Hypokalemia   3. Microcytic anemia   4. Nausea with vomiting        Katheren Shams, MD 09/23/13 0000

## 2013-09-22 NOTE — ED Provider Notes (Addendum)
CSN: [id number]     Arrival date & time 09/22/13  1833 History   None    Chief Complaint  Patient presents with  . Dehydration   (Consider location/radiation/quality/duration/timing/severity/associated sxs/prior Treatment) Patient is a 22 y.o. female presenting with weakness. The history is provided by the patient.  Weakness This is a new problem. The current episode started 2 days ago. The problem has been gradually worsening. Associated symptoms include shortness of breath. Pertinent negatives include no chest pain and no abdominal pain. Associated symptoms comments: Colon ca and hiv , h/o dehydration . Feeling weak from increased urinary output , near syncopal..    Past Medical History  Diagnosis Date  . UTI (lower urinary tract infection)   . Heart murmur   . Pneumonia 2008; 2012  . Complication of anesthesia     "I swallowed the anesthesia when appendix taken out; got pneumonia"  . Iron deficiency anemia   . History of blood transfusion 04/26/2013    "4 bags" (05/21/2013)  . Cancer 2014    Colon   Past Surgical History  Procedure Laterality Date  . Leg reconstruction using fascial flap  ~ 2009  . Still born baby  03/22/13    delivered vaginally @ ~ 24 weeks  . Appendectomy  2008  . Colonoscopy N/A 05/24/2013    Procedure: COLONOSCOPY;  Surgeon: Missy Sabins, MD;  Location: [facility];  Service: Endoscopy;  Laterality: N/A;  . Colon surgery     Family History  Problem Relation Age of Onset  . Adopted: Yes  . Alcohol abuse Neg Hx   . Arthritis Neg Hx   . Asthma Neg Hx   . Birth defects Neg Hx   . Cancer Neg Hx   . COPD Neg Hx   . Depression Neg Hx   . Diabetes Neg Hx   . Drug abuse Neg Hx   . Early death Neg Hx   . Hearing loss Neg Hx   . Heart disease Neg Hx   . Hyperlipidemia Neg Hx   . Hypertension Neg Hx   . Kidney disease Neg Hx   . Learning disabilities Neg Hx   . Mental illness Neg Hx   . Mental retardation Neg Hx   . Miscarriages / Stillbirths Neg Hx    . Stroke Neg Hx   . Vision loss Neg Hx   . ALS Mother    History  Substance Use Topics  . Smoking status: Never Smoker   . Smokeless tobacco: Never Used  . Alcohol Use: No   OB History   Grav Para Term Preterm Abortions TAB SAB Ect Mult Living   1 1  1            Review of Systems  Constitutional: Negative for fever.  Respiratory: Positive for shortness of breath. Negative for cough.   Cardiovascular: Negative for chest pain and leg swelling.  Gastrointestinal: Negative for nausea, vomiting, abdominal pain, diarrhea and blood in stool.  Neurological: Positive for weakness.    Allergies  Review of patient's allergies indicates no known allergies.  Home Medications   No current outpatient prescriptions on file. BP 91/54  Pulse 105  Temp(Src) 97.7 F (36.5 C) (Oral)  Resp 16  SpO2 100%  LMP 08/27/2013 Physical Exam  Nursing note and vitals reviewed. Constitutional: She is oriented to person, place, and time. She appears lethargic. She has a sickly appearance. She appears ill. No distress.  Neck: Normal range of motion. Neck supple.  Cardiovascular: Regular  rhythm.   Pulmonary/Chest: Breath sounds normal.  Abdominal: She exhibits no distension. There is no tenderness. There is no rebound.  Ostomy bag present.  Lymphadenopathy:    She has no cervical adenopathy.  Neurological: She is oriented to person, place, and time. She appears lethargic.  Skin: Skin is warm and dry.    ED Course  Procedures (including critical care time) Labs Review Labs Reviewed - No data to display Imaging Review No results found.    MDM   1. Dehydration syndrome        Billy Fischer, MD 09/22/13 [place], MD 09/23/13 [phone]

## 2013-09-22 NOTE — ED Notes (Signed)
Received report from the off going RN and introduced self to the pt.

## 2013-09-22 NOTE — ED Notes (Signed)
Report attempted x 1

## 2013-09-22 NOTE — ED Notes (Signed)
Sob, dehydration.  Patient has been dehydrated before.  Feeling dehydrated now.

## 2013-09-23 ENCOUNTER — Encounter (HOSPITAL_COMMUNITY): Payer: Self-pay | Admitting: General Practice

## 2013-09-23 DIAGNOSIS — N179 Acute kidney failure, unspecified: Secondary | ICD-10-CM | POA: Diagnosis present

## 2013-09-23 LAB — COMPREHENSIVE METABOLIC PANEL
AST: 14 U/L (ref 0–37)
BUN: 20 mg/dL (ref 6–23)
Calcium: 8.8 mg/dL (ref 8.4–10.5)
Chloride: 106 mEq/L (ref 96–112)
GFR calc Af Amer: >90 mL/min (ref >90)
Glucose, Bld: 100 mg/dL — ABNORMAL HIGH (ref 70–99)
Sodium: 131 mEq/L — ABNORMAL LOW (ref 135–145)
Total Protein: 7 g/dL (ref 6.0–8.3)

## 2013-09-23 LAB — CBC WITH DIFFERENTIAL/PLATELET
Basophils Absolute: 0 10*3/uL (ref 0.0–0.1)
Basophils Relative: 0 % (ref 0–1)
Eosinophils Relative: 2 % (ref 0–5)
Lymphocytes Relative: 41 % (ref 12–46)
Lymphs Abs: 3.3 10*3/uL (ref 0.7–4.0)
Neutro Abs: 4 10*3/uL (ref 1.7–7.7)
Neutrophils Relative %: 49 % (ref 43–77)
Platelets: 349 10*3/uL (ref 150–400)
RBC: 4.51 MIL/uL (ref 3.87–5.11)
WBC: 8.1 10*3/uL (ref 4.0–10.5)

## 2013-09-23 LAB — TSH: TSH: 0.828 u[IU]/mL (ref 0.350–4.500)

## 2013-09-23 LAB — MRSA PCR SCREENING: MRSA by PCR: POSITIVE — AB

## 2013-09-23 LAB — PROTIME-INR
INR: 1.13 (ref 0.00–1.49)
Prothrombin Time: 14.3 seconds (ref 11.6–15.2)

## 2013-09-23 LAB — MAGNESIUM: Magnesium: 2.1 mg/dL (ref 1.5–2.5)

## 2013-09-23 MED ORDER — SODIUM CHLORIDE 0.9 % IV SOLN
INTRAVENOUS | Status: DC
  Administered 2013-09-23: 10:00:00 via INTRAVENOUS

## 2013-09-23 MED ORDER — PROMETHAZINE HCL 12.5 MG PO TABS: 12.5000 mg | ORAL_TABLET | Freq: Four times a day (QID) | ORAL | Status: DC | PRN

## 2013-09-23 MED ORDER — ACETAMINOPHEN 325 MG PO TABS: 650.0000 mg | ORAL_TABLET | Freq: Four times a day (QID) | ORAL | Status: DC | PRN

## 2013-09-23 MED ORDER — ONDANSETRON HCL 4 MG PO TABS: 4.0000 mg | ORAL_TABLET | Freq: Four times a day (QID) | ORAL | Status: DC | PRN

## 2013-09-23 MED ORDER — INFLUENZA VAC SPLIT QUAD 0.5 ML IM SUSP
0.5000 mL | Freq: Once | INTRAMUSCULAR | Status: AC
  Administered 2013-09-23: 0.5 mL via INTRAMUSCULAR
  Filled 2013-09-23: qty 0.5

## 2013-09-23 MED ORDER — CIPROFLOXACIN HCL 250 MG PO TABS: 250.0000 mg | ORAL_TABLET | Freq: Two times a day (BID) | ORAL | Status: DC

## 2013-09-23 MED ORDER — LOPERAMIDE HCL 1 MG/5ML PO LIQD: 3.0000 mg | Freq: Four times a day (QID) | ORAL | Status: DC | PRN

## 2013-09-23 MED ORDER — ACETAMINOPHEN 650 MG RE SUPP: 650.0000 mg | Freq: Four times a day (QID) | RECTAL | Status: DC | PRN

## 2013-09-23 MED ORDER — SODIUM CHLORIDE 0.9 % IJ SOLN: 3.0000 mL | Freq: Two times a day (BID) | INTRAMUSCULAR | Status: DC

## 2013-09-23 MED ORDER — DEXTROSE 5 % IV SOLN
1.0000 g | INTRAVENOUS | Status: DC
  Administered 2013-09-23: 1 g via INTRAVENOUS
  Filled 2013-09-23: qty 10

## 2013-09-23 MED ORDER — POTASSIUM CHLORIDE IN NACL 20-0.9 MEQ/L-% IV SOLN
INTRAVENOUS | Status: DC
  Administered 2013-09-23: 04:00:00 via INTRAVENOUS
  Filled 2013-09-23 (×2): qty 1000

## 2013-09-23 MED ORDER — HYDROCODONE-ACETAMINOPHEN 5-325 MG PO TABS: 1.0000 | ORAL_TABLET | ORAL | Status: DC | PRN

## 2013-09-23 MED ORDER — ONDANSETRON HCL 4 MG/2ML IJ SOLN: 4.0000 mg | Freq: Four times a day (QID) | INTRAMUSCULAR | Status: DC | PRN

## 2013-09-23 NOTE — Progress Notes (Signed)
Ambulated pt up in the hallway around nurses station 2 times, pt did not have any c/o lightheadedness or being dizzy; Dr.Rai paged and made aware

## 2013-09-23 NOTE — ED Provider Notes (Signed)
22 year old female with colostomy status post colon resection for cancer and has had excessive output from her colostomy today. She states that this happens frequently and she usually just gets IV hydration. However, and today her potassium is noted to be low at 2.9, BUN has increased from 0.5-1.7 and hemoglobin has increased from 8.5-14. This indicates severe dehydration. It is too severe to warrant hydration in the ED and discharged. She is admitted for aggressive hydration and will need monitoring of her renal status.  I saw and evaluated the patient, reviewed the resident's note and I agree with the findings and plan.   Delora Fuel, MD [id number]

## 2013-09-23 NOTE — Consult Note (Signed)
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 She is independent with ostomy care. Changing pouch every 4 days at home.   Stomal assessment/size: 7/8" round, budded(slight prolapse), pink and moist Peristomal assessment: pouch intact from patient's placement last evening Treatment options for stomal/peristomal skin: pt not using any additional products other than wafer and pouch Output high volume, liquid brown-green Ostomy pouching: 1pc karaya in place from last evening (house staff could not identify correct pouching system and ordered this for patient), she will change once correct supplies arrive to her room. Education provided:  Pt independent in her ostomy care and understands she will need to use CTF wafer while inpatient, she is ok with this and will cut new wafer once arrives to her room. She is familiar with lock and roll closure also.   No other ostomy needs at this time. Discussed POC with patient and bedside nurse.  Re consult if needed, will not follow at this time. Thanks  Alaze Garverick [place], [place] [phone])

## 2013-09-23 NOTE — Discharge Summary (Signed)
Physician Discharge Summary  Patient ID: Cindy [name] MRN: [id number] DOB/AGE: 12-15-1990 22 y.o.  Admit date: 09/22/2013 Discharge date: 09/23/2013  Primary Care Physician:  Vidal Schwalbe, MD  Discharge Diagnoses:    . Acute renal failure . Hypokalemia Severe dehydration due to high output ostomy Urinary tract infection Recent diagnosis of colon cancer, status post colostomy   Consults:  None   Recommendations for Outpatient Follow-up:  1. patient was recommended to followup with Dr. Morton Stall at [facility] sooner as she's been having intermittent dehydration issues due to high output in colostomy. She has an appointment next month. 2. please follow urine cultures and sensitivities.   Allergies:  No Known Allergies   Discharge Medications:   Medication List         ciprofloxacin 250 MG tablet  Commonly known as:  CIPRO  Take 1 tablet (250 mg total) by mouth 2 (two) times daily. X 3 days     EPINEPHrine 0.3 mg/0.3 mL Soaj injection  Commonly known as:  EPIPEN  Inject 0.3 mLs (0.3 mg total) into the muscle as needed.     loperamide 1 MG/5ML solution  Commonly known as:  IMODIUM  Take 15 mLs (3 mg total) by mouth 4 (four) times daily as needed for diarrhea or loose stools.     promethazine 12.5 MG tablet  Commonly known as:  PHENERGAN  Take 1 tablet (12.5 mg total) by mouth every 6 (six) hours as needed for nausea.         Brief H and P: For complete details please refer to admission H and P, but in brief patient is a 22 year old Caucasian female with history of colon cancer s/p colostomy, diagnosed in June 2014 presented to the ER feeling weak, dizzy and short of breath for the last few days prior to admission. Patient reported dizzy spells but no associated loss of consciousness. She also reported somewhat increased ostomy output but no blood in the stools. No fever or chills no abdominal pain. Patient was noted to have potassium of 2.6 in ED. Creatinine was 1.7,  hemoglobin of 15.3, hematocrit 45.0, consistent with severe dehydration.  Hospital Course:     Acute renal failure with dehydration, hypokalemia: Secondary to dehydration from highly output ostomy.  Patient was admitted and placed on aggressive IV fluid hydration. Potassium was replaced. She is tolerating her oral diet without any difficulty. Per patient, the output from the ostomy has normalized now and this intermittently happens causing dehydration. She feels closer to her baseline. Creatinine has improved to 0.94, potassium to 3.9, hemoglobin 12.2 which is probably close to her baseline. I encouraged her to followup with Dr. Morton Stall (surgical oncology) at [facility] sooner than her November 20th appt if possible. BP is borderline low but patient is ambulating in the hallway without any difficulty, dizziness prior to DC.  UTI: Asymptomatic, patient denied any dysuria or hematuria, placed on oral ciprofloxacin for 3 days. Please follow urine cultures for the sensitivities    Hypokalemia: replaced   Day of Discharge BP 83/41  Pulse 67  Temp(Src) 97.6 F (36.4 C) (Oral)  Resp 18  Ht 5\' 1"  (1.549 m)  Wt 42.3 kg (93 lb 4.1 oz)  BMI 17.63 kg/m2  SpO2 100%  LMP 08/27/2013  Physical Exam: General: Alert and awake oriented x3 not in any acute distress. CVS: S1-S2 clear no murmur rubs or gallops Chest: clear to auscultation bilaterally, no wheezing rales or rhonchi Abdomen: soft nontender, nondistended, + colostomy  bag with small watery stool Extremities: no cyanosis, clubbing or edema noted bilaterally    The results of significant diagnostics from this hospitalization (including imaging, microbiology, ancillary and laboratory) are listed below for reference.    LAB RESULTS: Basic Metabolic Panel:  Recent Labs Lab 09/22/13 2040 09/23/13 0535  NA 133* 131*  K 2.6* 3.9  CL 103 106  CO2  --  13*  GLUCOSE 94 100*  BUN 27* 20  CREATININE 1.70* 0.94  CALCIUM   --  8.8  MG  --  2.1  PHOS  --  3.2   Liver Function Tests:  Recent Labs Lab 09/23/13 0535  AST 14  ALT 11  ALKPHOS 81  BILITOT 0.2*  PROT 7.0  ALBUMIN 3.4*   No results found for this basename: LIPASE, AMYLASE,  in the last 168 hours No results found for this basename: AMMONIA,  in the last 168 hours CBC:  Recent Labs Lab 09/22/13 2015 09/22/13 2040 09/23/13 0535  WBC 10.4  --  8.1  NEUTROABS 5.8  --  4.0  HGB 14.1 15.3* 12.2  HCT 40.8 45.0 35.5*  MCV 78.3  --  78.7  PLT 398  --  349   Cardiac Enzymes: No results found for this basename: CKTOTAL, CKMB, CKMBINDEX, TROPONINI,  in the last 168 hours BNP: No components found with this basename: POCBNP,  CBG:  Recent Labs Lab 09/23/13 0746  GLUCAP 103*    Significant Diagnostic Studies:  No results found.    Disposition and Follow-up:     Discharge Orders   Future Orders Complete By Expires   Diet general  As directed    Increase activity slowly  As directed        DISPOSITION: Home DIET: Regular diet  ACTIVITY: As tolerated  DISCHARGE FOLLOW-UP Follow-up Information   Follow up with Vidal Schwalbe, MD. Schedule an appointment as soon as possible for a visit in 10 days. (for hospital follow-up)    Specialty:  Family Medicine   Contact information:   [address], [place] [address] [phone]       Follow up with Cheryll Cockayne, MD. Schedule an appointment as soon as possible for a visit in 2 weeks. (please try to get the appointment sooner if you can, otherwise keep your appointment on Nov 20th.   )    Specialty:  Surgical Oncology   Contact information:   [place] [address] [phone]       Time spent on Discharge: 35 mins  Signed:   Malay Fantroy M.D. [facility] 09/23/2013, 1:24 PM Pager: [phone]

## 2013-09-23 NOTE — Progress Notes (Signed)
Utilization review completed.  

## 2013-09-23 NOTE — Progress Notes (Signed)
D/c orders received;IV removed with gauze on, pt remains in stable condition, pt meds and instructions reviewed and given to pt; pt d/c to home 

## 2013-09-24 LAB — URINE CULTURE: Colony Count: NO GROWTH

## 2013-11-03 ENCOUNTER — Emergency Department (HOSPITAL_COMMUNITY): Payer: Self-pay

## 2013-11-03 ENCOUNTER — Encounter (HOSPITAL_COMMUNITY): Payer: Self-pay | Admitting: Emergency Medicine

## 2013-11-03 ENCOUNTER — Emergency Department (HOSPITAL_COMMUNITY)
Admission: EM | Admit: 2013-11-03 | Discharge: 2013-11-03 | Disposition: A | Discharge status: Home / Self Care | Payer: Self-pay | Attending: Emergency Medicine | Admitting: Emergency Medicine

## 2013-11-03 DIAGNOSIS — R011 Cardiac murmur, unspecified: Secondary | ICD-10-CM | POA: Insufficient documentation

## 2013-11-03 DIAGNOSIS — T23122A Burn of first degree of single left finger (nail) except thumb, initial encounter: Secondary | ICD-10-CM

## 2013-11-03 DIAGNOSIS — T23229A Burn of second degree of unspecified single finger (nail) except thumb, initial encounter: Secondary | ICD-10-CM | POA: Insufficient documentation

## 2013-11-03 DIAGNOSIS — S62639A Displaced fracture of distal phalanx of unspecified finger, initial encounter for closed fracture: Secondary | ICD-10-CM | POA: Insufficient documentation

## 2013-11-03 DIAGNOSIS — Z3202 Encounter for pregnancy test, result negative: Secondary | ICD-10-CM | POA: Insufficient documentation

## 2013-11-03 DIAGNOSIS — Y93G9 Activity, other involving cooking and grilling: Secondary | ICD-10-CM | POA: Insufficient documentation

## 2013-11-03 DIAGNOSIS — W268XXA Contact with other sharp object(s), not elsewhere classified, initial encounter: Secondary | ICD-10-CM | POA: Insufficient documentation

## 2013-11-03 DIAGNOSIS — Z8701 Personal history of pneumonia (recurrent): Secondary | ICD-10-CM | POA: Insufficient documentation

## 2013-11-03 DIAGNOSIS — Y990 Civilian activity done for income or pay: Secondary | ICD-10-CM | POA: Insufficient documentation

## 2013-11-03 DIAGNOSIS — Z85038 Personal history of other malignant neoplasm of large intestine: Secondary | ICD-10-CM | POA: Insufficient documentation

## 2013-11-03 DIAGNOSIS — Y9269 Other specified industrial and construction area as the place of occurrence of the external cause: Secondary | ICD-10-CM | POA: Insufficient documentation

## 2013-11-03 DIAGNOSIS — W230XXA Caught, crushed, jammed, or pinched between moving objects, initial encounter: Secondary | ICD-10-CM | POA: Insufficient documentation

## 2013-11-03 DIAGNOSIS — Z87440 Personal history of urinary (tract) infections: Secondary | ICD-10-CM | POA: Insufficient documentation

## 2013-11-03 DIAGNOSIS — Z862 Personal history of diseases of the blood and blood-forming organs and certain disorders involving the immune mechanism: Secondary | ICD-10-CM | POA: Insufficient documentation

## 2013-11-03 MED ORDER — SILVER SULFADIAZINE 1 % EX CREA: 1.0000 "application " | TOPICAL_CREAM | Freq: Every day | CUTANEOUS | Status: DC

## 2013-11-03 MED ORDER — TRAMADOL HCL 50 MG PO TABS: 50.0000 mg | ORAL_TABLET | Freq: Four times a day (QID) | ORAL | Status: DC | PRN

## 2013-11-03 MED ORDER — SILVER SULFADIAZINE 1 % EX CREA
TOPICAL_CREAM | Freq: Two times a day (BID) | CUTANEOUS | Status: DC
Start: 2013-11-03 — End: 2013-11-03
  Administered 2013-11-03: 16:00:00 via TOPICAL
  Filled 2013-11-03: qty 50

## 2013-11-03 NOTE — Progress Notes (Signed)
[facility] provided pt with a list of primary care resources and information on [facility].

## 2013-11-03 NOTE — ED Provider Notes (Signed)
CSN: [id number]     Arrival date & time 11/03/13  1321 History   This chart was scribed for Ignacia Felling, [place] working with Varney Biles, MD by Roxan Diesel, ED Scribe. This patient was seen in room WTR7/WTR7 and the patient's care was started at 3:15 PM.   Chief Complaint  Patient presents with  . Finger Injury    The history is provided by the patient. No language interpreter was used.    HPI Comments: Cindy [name] is a 22 y.o. female who presents to the Emergency Department complaining of a left middle finger injury sustained 5 days ago.  Pt reports she was at work at [employer] 5 days ago and was making a sandwich when she accidentally jabbed her finger into something.  She immediately developed severe pain to that finger that is worsened by movement.  The following day she also noticed a painful blister to the tip of the finger.  She was working near [employer] but does not remember whether she burned herself during the injury.  She has been wearing a finger splint supplied by her work, without relief.  She denies pain or injury to any other area.  Pt also requests a pregnancy test.   Past Medical History  Diagnosis Date  . UTI (lower urinary tract infection)   . Heart murmur   . Pneumonia 2008; 2012  . Complication of anesthesia     "I swallowed the anesthesia when appendix taken out; got pneumonia"  . Iron deficiency anemia   . History of blood transfusion 04/26/2013    "4 bags" (05/21/2013)  . Cancer 2014    Colon    Past Surgical History  Procedure Laterality Date  . Leg reconstruction using fascial flap  ~ 2009  . Still born baby  03/22/13    delivered vaginally @ ~ 24 weeks  . Appendectomy  2008  . Colonoscopy N/A 05/24/2013    Procedure: COLONOSCOPY;  Surgeon: Missy Sabins, MD;  Location: [place];  Service: Endoscopy;  Laterality: N/A;  . Colon surgery    . Ileostomy  06/26/2013    Family History  Problem Relation Age of Onset  . Adopted: Yes  .  Alcohol abuse Neg Hx   . Arthritis Neg Hx   . Asthma Neg Hx   . Birth defects Neg Hx   . Cancer Neg Hx   . COPD Neg Hx   . Depression Neg Hx   . Diabetes Neg Hx   . Drug abuse Neg Hx   . Early death Neg Hx   . Hearing loss Neg Hx   . Heart disease Neg Hx   . Hyperlipidemia Neg Hx   . Hypertension Neg Hx   . Kidney disease Neg Hx   . Learning disabilities Neg Hx   . Mental illness Neg Hx   . Mental retardation Neg Hx   . Miscarriages / Stillbirths Neg Hx   . Stroke Neg Hx   . Vision loss Neg Hx   . ALS Mother     History  Substance Use Topics  . Smoking status: Never Smoker   . Smokeless tobacco: Never Used  . Alcohol Use: No    OB History   Grav Para Term Preterm Abortions TAB SAB Ect Mult Living   1 1  1             Review of Systems  All other systems reviewed and are negative.     Allergies  Review of  patient's allergies indicates no known allergies.  Home Medications   Current Outpatient Rx  Name  Route  Sig  Dispense  Refill  . acetaminophen (TYLENOL) 325 MG tablet   Oral   Take 325 mg by mouth every 6 (six) hours as needed (pain).         Marland Kitchen loperamide (IMODIUM) 1 MG/5ML solution   Oral   Take 15 mLs (3 mg total) by mouth 4 (four) times daily as needed for diarrhea or loose stools.   120 mL   3   . EPINEPHrine (EPIPEN) 0.3 mg/0.3 mL SOAJ injection   Intramuscular   Inject 0.3 mLs (0.3 mg total) into the muscle as needed.   2 Device   0    BP 115/64  Pulse 108  Temp(Src) 98.4 F (36.9 C) (Oral)  SpO2 98%  Physical Exam  Nursing note and vitals reviewed. Constitutional: She is oriented to person, place, and time. She appears well-developed and well-nourished. No distress.  HENT:  Head: Normocephalic and atraumatic.  Eyes: Conjunctivae are normal. No scleral icterus.  Pulmonary/Chest: Effort normal.  Musculoskeletal:       Left hand: She exhibits tenderness. She exhibits normal range of motion, normal two-point discrimination and no  deformity. Normal sensation noted. Normal strength noted. She exhibits no thumb/finger opposition.       Hands: Neurological: She is alert and oriented to person, place, and time. She exhibits normal muscle tone. Coordination normal.  Skin: Skin is warm and dry. No rash noted. No erythema. No pallor.  Psychiatric: She has a normal mood and affect. Her behavior is normal. Judgment and thought content normal.    ED Course  Procedures (including critical care time)  DIAGNOSTIC STUDIES: Oxygen Saturation is 98% on room air, normal by my interpretation.    COORDINATION OF CARE: 3:20 PM-Informed pt that imaging reveals fracture.  Discussed treatment plan which includes finger splint application and referral to hand surgery with pt at bedside and pt agreed to plan.    Results for orders placed during the hospital encounter of 11/03/13  POCT PREGNANCY, URINE      Result Value Range   Preg Test, Ur NEGATIVE  NEGATIVE    Dg Finger Middle Left  11/03/2013   CLINICAL DATA:  Pain post trauma  EXAM: LEFT 3RD FINGER 2+V  COMPARISON:  None.  FINDINGS: Frontal, oblique, and lateral views were obtained. There is an obliquely oriented fracture along the proximal most aspect of the 3rd distal phalanx along the medial aspect. Alignment is near anatomic. No other fracture. No dislocation. Joint spaces appear intact.  IMPRESSION: Fracture along the medial aspect of the proximal portion of the 3rd distal phalanx. Alignment near anatomic. Study otherwise unremarkable.   Electronically Signed   By: Lowella Grip M.D.   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  Placed in finger splint and dressing.  I personally performed the services described in this documentation, which was scribed in my  presence. The recorded information has been reviewed and is accurate.    Idalia Needle Joelyn Oms, PA-C 11/03/13 1558

## 2013-11-03 NOTE — ED Notes (Signed)
On wed jammed lt middle finger and now has a bump at top of finger that concerns her. Pain to finger and work gave her a splint for it with no relief.

## 2013-11-04 NOTE — ED Provider Notes (Signed)
Medical screening examination/treatment/procedure(s) were performed by non-physician practitioner and as supervising physician I was immediately available for consultation/collaboration.  EKG Interpretation   None        Varney Biles, MD 11/04/13 [phone]

## 2013-11-09 ENCOUNTER — Emergency Department (HOSPITAL_COMMUNITY)
Admission: EM | Admit: 2013-11-09 | Discharge: 2013-11-10 | Disposition: A | Discharge status: Home / Self Care | Payer: Medicaid Other | Attending: Emergency Medicine | Admitting: Emergency Medicine

## 2013-11-09 ENCOUNTER — Encounter (HOSPITAL_COMMUNITY): Payer: Self-pay | Admitting: Emergency Medicine

## 2013-11-09 DIAGNOSIS — Z87440 Personal history of urinary (tract) infections: Secondary | ICD-10-CM | POA: Insufficient documentation

## 2013-11-09 DIAGNOSIS — Z8701 Personal history of pneumonia (recurrent): Secondary | ICD-10-CM | POA: Insufficient documentation

## 2013-11-09 DIAGNOSIS — Z792 Long term (current) use of antibiotics: Secondary | ICD-10-CM | POA: Insufficient documentation

## 2013-11-09 DIAGNOSIS — Z862 Personal history of diseases of the blood and blood-forming organs and certain disorders involving the immune mechanism: Secondary | ICD-10-CM | POA: Insufficient documentation

## 2013-11-09 DIAGNOSIS — R011 Cardiac murmur, unspecified: Secondary | ICD-10-CM | POA: Insufficient documentation

## 2013-11-09 DIAGNOSIS — M79609 Pain in unspecified limb: Secondary | ICD-10-CM | POA: Insufficient documentation

## 2013-11-09 DIAGNOSIS — Z85038 Personal history of other malignant neoplasm of large intestine: Secondary | ICD-10-CM | POA: Insufficient documentation

## 2013-11-09 DIAGNOSIS — S62639S Displaced fracture of distal phalanx of unspecified finger, sequela: Secondary | ICD-10-CM

## 2013-11-09 DIAGNOSIS — G8911 Acute pain due to trauma: Secondary | ICD-10-CM | POA: Insufficient documentation

## 2013-11-09 MED ORDER — OXYCODONE-ACETAMINOPHEN 5-325 MG PO TABS
1.0000 | ORAL_TABLET | Freq: Once | ORAL | Status: AC
  Administered 2013-11-09: 1 via ORAL
  Filled 2013-11-09: qty 1

## 2013-11-09 MED ORDER — IBUPROFEN 800 MG PO TABS
800.0000 mg | ORAL_TABLET | Freq: Once | ORAL | Status: AC
  Administered 2013-11-09: 800 mg via ORAL
  Filled 2013-11-09: qty 1

## 2013-11-09 NOTE — ED Notes (Signed)
Pt arrived to th ED with a complaint of finger pain. Pt was here on Tuesday and told that she had a broken knuckle and a burn to her left middle finger.  Pt states she had increased swelling so she took off her finger splint and stopped using her silverdyne

## 2013-11-10 MED ORDER — IBUPROFEN 800 MG PO TABS: 800.0000 mg | ORAL_TABLET | Freq: Three times a day (TID) | ORAL | Status: DC | PRN

## 2013-11-10 MED ORDER — HYDROCODONE-ACETAMINOPHEN 5-325 MG PO TABS: 1.0000 | ORAL_TABLET | Freq: Four times a day (QID) | ORAL | Status: DC | PRN

## 2013-11-10 NOTE — ED Provider Notes (Signed)
CSN: [id number]     Arrival date & time 11/09/13  2101 History   First MD Initiated Contact with Patient 11/09/13 2308     Chief Complaint  Patient presents with  . Extremity Pain   (Consider location/radiation/quality/duration/timing/severity/associated sxs/prior Treatment) HPI Patient presents emergency department for reevaluation of her finger injury that occurred 10 days, ago.  Patient, states, that she smashed her finger at work and then burned it on the grill.  The patient, states, other area, is still very painful.  Patient denies numbness, weakness, in the finger.  He states that she did not receive any pain medications on her last visit. Past Medical History  Diagnosis Date  . UTI (lower urinary tract infection)   . Heart murmur   . Pneumonia 2008; 2012  . Complication of anesthesia     "I swallowed the anesthesia when appendix taken out; got pneumonia"  . Iron deficiency anemia   . History of blood transfusion 04/26/2013    "4 bags" (05/21/2013)  . Cancer 2014    Colon   Past Surgical History  Procedure Laterality Date  . Leg reconstruction using fascial flap  ~ 2009  . Still born baby  03[birth year]    delivered vaginally @ ~ 24 weeks  . Appendectomy  2008  . Colonoscopy N/A 05/24/2013    Procedure: COLONOSCOPY;  Surgeon: Missy Sabins, MD;  Location: [facility];  Service: Endoscopy;  Laterality: N/A;  . Colon surgery    . Ileostomy  06/26/2013   Family History  Problem Relation Age of Onset  . Adopted: Yes  . Alcohol abuse Neg Hx   . Arthritis Neg Hx   . Asthma Neg Hx   . Birth defects Neg Hx   . Cancer Neg Hx   . COPD Neg Hx   . Depression Neg Hx   . Diabetes Neg Hx   . Drug abuse Neg Hx   . Early death Neg Hx   . Hearing loss Neg Hx   . Heart disease Neg Hx   . Hyperlipidemia Neg Hx   . Hypertension Neg Hx   . Kidney disease Neg Hx   . Learning disabilities Neg Hx   . Mental illness Neg Hx   . Mental retardation Neg Hx   . Miscarriages / Stillbirths Neg Hx    . Stroke Neg Hx   . Vision loss Neg Hx   . ALS Mother    History  Substance Use Topics  . Smoking status: Never Smoker   . Smokeless tobacco: Never Used  . Alcohol Use: No   OB History   Grav Para Term Preterm Abortions TAB SAB Ect Mult Living   1 1  1            Review of Systems All other systems negative except as documented in the HPI. All pertinent positives and negatives as reviewed in the HPI. Allergies  Review of patient's allergies indicates no known allergies.  Home Medications   Current Outpatient Rx  Name  Route  Sig  Dispense  Refill  . acetaminophen (TYLENOL) 500 MG tablet   Oral   Take 2,000 mg by mouth every 4 (four) hours as needed for moderate pain.         Marland Kitchen EPINEPHrine (EPIPEN) 0.3 mg/0.3 mL SOAJ injection   Intramuscular   Inject 0.3 mLs (0.3 mg total) into the muscle as needed.   2 Device   0   . ibuprofen (ADVIL,MOTRIN) 200 MG tablet   Oral  Take 200 mg by mouth every 6 (six) hours as needed for moderate pain.         Marland Kitchen loperamide (IMODIUM) 1 MG/5ML solution   Oral   Take 15 mLs (3 mg total) by mouth 4 (four) times daily as needed for diarrhea or loose stools.   120 mL   3   . naproxen sodium (ANAPROX) 220 MG tablet   Oral   Take 220 mg by mouth every 8 (eight) hours as needed (pain).         Marland Kitchen silver sulfADIAZINE (SILVADENE) 1 % cream   Topical   Apply 1 application topically daily.   50 g   0    BP 112/58  Pulse 112  Temp(Src) 98.5 F (36.9 C) (Oral)  Resp 15  SpO2 100%  LMP 10/27/2013 Physical Exam  Constitutional: She is oriented to person, place, and time. She appears well-developed and well-nourished.  Eyes: Pupils are equal, round, and reactive to light.  Musculoskeletal:       Hands: Neurological: She is alert and oriented to person, place, and time.  Skin: Skin is warm and dry.    ED Course  Procedures (including critical care time) Patient be referred to hand.  There is no sign of infection at this  time.  She is advised to return here as needed.  Patient is advised to ice and elevate the finger.   Brent General, PA-C 11/10/13 [phone]

## 2013-11-10 NOTE — ED Provider Notes (Signed)
Medical screening examination/treatment/procedure(s) were conducted as a shared visit with non-physician practitioner(s) and myself.  I personally evaluated the patient during the encounter.  EKG Interpretation   None       Pt with burn to the finger without signs of infection at this time and no evidence of felon or paronychia  Blanchie Dessert, MD 11/10/13 [phone]

## 2013-11-13 ENCOUNTER — Emergency Department (HOSPITAL_COMMUNITY)
Admission: EM | Admit: 2013-11-13 | Discharge: 2013-11-14 | Disposition: A | Discharge status: Home / Self Care | Payer: Worker's Compensation | Attending: Emergency Medicine | Admitting: Emergency Medicine

## 2013-11-13 DIAGNOSIS — R011 Cardiac murmur, unspecified: Secondary | ICD-10-CM | POA: Insufficient documentation

## 2013-11-13 DIAGNOSIS — Z87828 Personal history of other (healed) physical injury and trauma: Secondary | ICD-10-CM | POA: Insufficient documentation

## 2013-11-13 DIAGNOSIS — Z862 Personal history of diseases of the blood and blood-forming organs and certain disorders involving the immune mechanism: Secondary | ICD-10-CM | POA: Insufficient documentation

## 2013-11-13 DIAGNOSIS — Z85038 Personal history of other malignant neoplasm of large intestine: Secondary | ICD-10-CM | POA: Insufficient documentation

## 2013-11-13 DIAGNOSIS — Z8701 Personal history of pneumonia (recurrent): Secondary | ICD-10-CM | POA: Insufficient documentation

## 2013-11-13 DIAGNOSIS — Z87440 Personal history of urinary (tract) infections: Secondary | ICD-10-CM | POA: Insufficient documentation

## 2013-11-13 DIAGNOSIS — Z792 Long term (current) use of antibiotics: Secondary | ICD-10-CM | POA: Insufficient documentation

## 2013-11-13 DIAGNOSIS — IMO0002 Reserved for concepts with insufficient information to code with codable children: Secondary | ICD-10-CM | POA: Insufficient documentation

## 2013-11-13 DIAGNOSIS — L02512 Cutaneous abscess of left hand: Secondary | ICD-10-CM

## 2013-11-14 ENCOUNTER — Emergency Department (HOSPITAL_COMMUNITY): Payer: Worker's Compensation

## 2013-11-14 ENCOUNTER — Ambulatory Visit (HOSPITAL_BASED_OUTPATIENT_CLINIC_OR_DEPARTMENT_OTHER): Payer: Self-pay | Admitting: Certified Registered"

## 2013-11-14 ENCOUNTER — Ambulatory Visit (HOSPITAL_BASED_OUTPATIENT_CLINIC_OR_DEPARTMENT_OTHER)
Admission: RE | Admit: 2013-11-14 | Discharge: 2013-11-14 | Disposition: A | Discharge status: Home / Self Care | Payer: Medicaid Other | Source: Ambulatory Visit | Attending: Orthopedic Surgery | Admitting: Orthopedic Surgery

## 2013-11-14 ENCOUNTER — Other Ambulatory Visit: Payer: Self-pay | Admitting: Orthopedic Surgery

## 2013-11-14 ENCOUNTER — Encounter (HOSPITAL_COMMUNITY): Payer: Self-pay | Admitting: Emergency Medicine

## 2013-11-14 ENCOUNTER — Encounter (HOSPITAL_BASED_OUTPATIENT_CLINIC_OR_DEPARTMENT_OTHER): Payer: Self-pay | Admitting: *Deleted

## 2013-11-14 ENCOUNTER — Encounter (HOSPITAL_BASED_OUTPATIENT_CLINIC_OR_DEPARTMENT_OTHER): Payer: Self-pay | Admitting: Certified Registered"

## 2013-11-14 ENCOUNTER — Encounter (HOSPITAL_BASED_OUTPATIENT_CLINIC_OR_DEPARTMENT_OTHER)
Admission: RE | Disposition: A | Discharge status: Home / Self Care | Payer: Self-pay | Source: Ambulatory Visit | Attending: Orthopedic Surgery

## 2013-11-14 DIAGNOSIS — L03019 Cellulitis of unspecified finger: Secondary | ICD-10-CM | POA: Insufficient documentation

## 2013-11-14 DIAGNOSIS — L02519 Cutaneous abscess of unspecified hand: Secondary | ICD-10-CM | POA: Insufficient documentation

## 2013-11-14 DIAGNOSIS — D649 Anemia, unspecified: Secondary | ICD-10-CM | POA: Insufficient documentation

## 2013-11-14 DIAGNOSIS — M869 Osteomyelitis, unspecified: Secondary | ICD-10-CM | POA: Insufficient documentation

## 2013-11-14 DIAGNOSIS — A4902 Methicillin resistant Staphylococcus aureus infection, unspecified site: Secondary | ICD-10-CM | POA: Insufficient documentation

## 2013-11-14 HISTORY — PX: I & D EXTREMITY: SHX5045

## 2013-11-14 LAB — CBC WITH DIFFERENTIAL/PLATELET
Basophils Relative: 0 % (ref 0–1)
Eosinophils Absolute: 0.1 10*3/uL (ref 0.0–0.7)
Eosinophils Relative: 1 % (ref 0–5)
Hemoglobin: 11.7 g/dL — ABNORMAL LOW (ref 12.0–15.0)
MCHC: 33 g/dL (ref 30.0–36.0)
Monocytes Absolute: 1.2 10*3/uL — ABNORMAL HIGH (ref 0.1–1.0)
Monocytes Relative: 10 % (ref 3–12)
Neutro Abs: 9 10*3/uL — ABNORMAL HIGH (ref 1.7–7.7)
Neutrophils Relative %: 75 % (ref 43–77)
RDW: 14.3 % (ref 11.5–15.5)
WBC: 12 10*3/uL — ABNORMAL HIGH (ref 4.0–10.5)

## 2013-11-14 LAB — BASIC METABOLIC PANEL
BUN: 15 mg/dL (ref 6–23)
CO2: 14 mEq/L — ABNORMAL LOW (ref 19–32)
Calcium: 9.3 mg/dL (ref 8.4–10.5)
Creatinine, Ser: 1.41 mg/dL — ABNORMAL HIGH (ref 0.50–1.10)
GFR calc non Af Amer: 52 mL/min — ABNORMAL LOW (ref >90)
Glucose, Bld: 114 mg/dL — ABNORMAL HIGH (ref 70–99)
Sodium: 136 mEq/L (ref 135–145)

## 2013-11-14 SURGERY — IRRIGATION AND DEBRIDEMENT EXTREMITY
Anesthesia: General | Site: Finger | Laterality: Left

## 2013-11-14 MED ORDER — BUPIVACAINE HCL (PF) 0.25 % IJ SOLN
INTRAMUSCULAR | Status: AC
  Filled 2013-11-14: qty 30

## 2013-11-14 MED ORDER — PROPOFOL 10 MG/ML IV BOLUS
INTRAVENOUS | Status: AC
  Filled 2013-11-14: qty 20

## 2013-11-14 MED ORDER — FENTANYL CITRATE 0.05 MG/ML IJ SOLN
INTRAMUSCULAR | Status: AC
  Filled 2013-11-14: qty 4

## 2013-11-14 MED ORDER — CEPHALEXIN 500 MG PO CAPS: 500.0000 mg | ORAL_CAPSULE | Freq: Four times a day (QID) | ORAL | Status: DC

## 2013-11-14 MED ORDER — LACTATED RINGERS IV SOLN
INTRAVENOUS | Status: DC
  Administered 2013-11-14: 15:00:00 via INTRAVENOUS

## 2013-11-14 MED ORDER — MIDAZOLAM HCL 5 MG/5ML IJ SOLN
INTRAMUSCULAR | Status: DC | PRN
  Administered 2013-11-14: 2 mg via INTRAVENOUS

## 2013-11-14 MED ORDER — FENTANYL CITRATE 0.05 MG/ML IJ SOLN
INTRAMUSCULAR | Status: DC | PRN
  Administered 2013-11-14: 100 ug via INTRAVENOUS

## 2013-11-14 MED ORDER — VANCOMYCIN HCL IN DEXTROSE 1-5 GM/200ML-% IV SOLN
INTRAVENOUS | Status: AC
  Filled 2013-11-14: qty 200

## 2013-11-14 MED ORDER — OXYCODONE HCL 5 MG/5ML PO SOLN: 5.0000 mg | Freq: Once | ORAL | Status: DC | PRN

## 2013-11-14 MED ORDER — PROMETHAZINE HCL 25 MG PO TABS: 25.0000 mg | ORAL_TABLET | Freq: Four times a day (QID) | ORAL | Status: DC | PRN

## 2013-11-14 MED ORDER — PROPOFOL 10 MG/ML IV BOLUS
INTRAVENOUS | Status: DC | PRN
  Administered 2013-11-14: 150 mg via INTRAVENOUS

## 2013-11-14 MED ORDER — OXYCODONE-ACETAMINOPHEN 5-325 MG PO TABS: 1.0000 | ORAL_TABLET | Freq: Four times a day (QID) | ORAL | Status: DC | PRN

## 2013-11-14 MED ORDER — CHLORHEXIDINE GLUCONATE 4 % EX LIQD: 60.0000 mL | Freq: Once | CUTANEOUS | Status: DC

## 2013-11-14 MED ORDER — CEPHALEXIN 250 MG PO CAPS
250.0000 mg | ORAL_CAPSULE | Freq: Once | ORAL | Status: DC
  Filled 2013-11-14: qty 1

## 2013-11-14 MED ORDER — ONDANSETRON HCL 4 MG/2ML IJ SOLN
INTRAMUSCULAR | Status: DC | PRN
  Administered 2013-11-14: 4 mg via INTRAVENOUS

## 2013-11-14 MED ORDER — DEXAMETHASONE SODIUM PHOSPHATE 10 MG/ML IJ SOLN
INTRAMUSCULAR | Status: DC | PRN
  Administered 2013-11-14: 10 mg via INTRAVENOUS

## 2013-11-14 MED ORDER — OXYCODONE HCL 5 MG PO TABS: 5.0000 mg | ORAL_TABLET | Freq: Once | ORAL | Status: DC | PRN

## 2013-11-14 MED ORDER — MIDAZOLAM HCL 2 MG/2ML IJ SOLN
INTRAMUSCULAR | Status: AC
  Filled 2013-11-14: qty 2

## 2013-11-14 MED ORDER — SULFAMETHOXAZOLE-TRIMETHOPRIM 800-160 MG PO TABS: 1.0000 | ORAL_TABLET | Freq: Two times a day (BID) | ORAL | Status: DC

## 2013-11-14 MED ORDER — VANCOMYCIN HCL IN DEXTROSE 1-5 GM/200ML-% IV SOLN
1000.0000 mg | Freq: Once | INTRAVENOUS | Status: AC
  Administered 2013-11-14: 1000 mg via INTRAVENOUS

## 2013-11-14 MED ORDER — LIDOCAINE HCL (CARDIAC) 20 MG/ML IV SOLN
INTRAVENOUS | Status: DC | PRN
  Administered 2013-11-14: 50 mg via INTRAVENOUS

## 2013-11-14 MED ORDER — OXYCODONE-ACETAMINOPHEN 5-325 MG PO TABS: ORAL_TABLET | ORAL | Status: DC

## 2013-11-14 MED ORDER — SULFAMETHOXAZOLE-TMP DS 800-160 MG PO TABS
1.0000 | ORAL_TABLET | Freq: Once | ORAL | Status: AC
  Administered 2013-11-14: 1 via ORAL
  Filled 2013-11-14: qty 1

## 2013-11-14 MED ORDER — BUPIVACAINE HCL (PF) 0.25 % IJ SOLN
INTRAMUSCULAR | Status: DC | PRN
  Administered 2013-11-14: 10 mL

## 2013-11-14 MED ORDER — OXYCODONE-ACETAMINOPHEN 5-325 MG PO TABS
1.0000 | ORAL_TABLET | Freq: Once | ORAL | Status: AC
  Administered 2013-11-14: 1 via ORAL
  Filled 2013-11-14: qty 1

## 2013-11-14 MED ORDER — PROMETHAZINE HCL 25 MG/ML IJ SOLN: 6.2500 mg | INTRAMUSCULAR | Status: DC | PRN

## 2013-11-14 MED ORDER — HYDROMORPHONE HCL PF 1 MG/ML IJ SOLN: 0.2500 mg | INTRAMUSCULAR | Status: DC | PRN

## 2013-11-14 SURGICAL SUPPLY — 48 items
BAG DECANTER FOR FLEXI CONT (MISCELLANEOUS) IMPLANT
BANDAGE ELASTIC 3 VELCRO ST LF (GAUZE/BANDAGES/DRESSINGS) IMPLANT
BANDAGE GAUZE STRT 1 STR LF (GAUZE/BANDAGES/DRESSINGS) IMPLANT
BLADE MINI RND TIP GREEN BEAV (BLADE) IMPLANT
BLADE SURG 15 STRL LF DISP TIS (BLADE) ×2 IMPLANT
BLADE SURG 15 STRL SS (BLADE) ×2
BNDG CMPR 9X4 STRL LF SNTH (GAUZE/BANDAGES/DRESSINGS) ×1
BNDG CMPR MD 5X2 ELC HKLP STRL (GAUZE/BANDAGES/DRESSINGS)
BNDG COHESIVE 1X5 TAN STRL LF (GAUZE/BANDAGES/DRESSINGS) ×1 IMPLANT
BNDG ELASTIC 2 VLCR STRL LF (GAUZE/BANDAGES/DRESSINGS) IMPLANT
BNDG ESMARK 4X9 LF (GAUZE/BANDAGES/DRESSINGS) ×1 IMPLANT
BNDG GAUZE ELAST 4 BULKY (GAUZE/BANDAGES/DRESSINGS) IMPLANT
CHLORAPREP W/TINT 26ML (MISCELLANEOUS) ×1 IMPLANT
CORDS BIPOLAR (ELECTRODE) ×1 IMPLANT
COVER MAYO STAND STRL (DRAPES) ×2 IMPLANT
COVER TABLE BACK 60X90 (DRAPES) ×2 IMPLANT
CUFF TOURNIQUET SINGLE 18IN (TOURNIQUET CUFF) ×2 IMPLANT
DRAPE EXTREMITY T 121X128X90 (DRAPE) ×2 IMPLANT
DRAPE SURG 17X23 STRL (DRAPES) ×2 IMPLANT
GAUZE PACKING IODOFORM 1/4X5 (PACKING) ×1 IMPLANT
GAUZE XEROFORM 1X8 LF (GAUZE/BANDAGES/DRESSINGS) ×2 IMPLANT
GLOVE BIO SURGEON STRL SZ7.5 (GLOVE) ×2 IMPLANT
GLOVE BIOGEL M STRL SZ7.5 (GLOVE) ×1 IMPLANT
GLOVE BIOGEL PI IND STRL 8 (GLOVE) ×1 IMPLANT
GLOVE BIOGEL PI INDICATOR 8 (GLOVE) ×2
GOWN BRE IMP PREV XXLGXLNG (GOWN DISPOSABLE) ×2 IMPLANT
GOWN PREVENTION PLUS XLARGE (GOWN DISPOSABLE) ×1 IMPLANT
LOOP VESSEL MAXI BLUE (MISCELLANEOUS) IMPLANT
NDL HYPO 25X1 1.5 SAFETY (NEEDLE) IMPLANT
NEEDLE HYPO 25X1 1.5 SAFETY (NEEDLE) ×2 IMPLANT
NS IRRIG 1000ML POUR BTL (IV SOLUTION) ×2 IMPLANT
PACK BASIN DAY SURGERY FS (CUSTOM PROCEDURE TRAY) ×2 IMPLANT
PAD CAST 3X4 CTTN HI CHSV (CAST SUPPLIES) IMPLANT
PADDING CAST ABS 4INX4YD NS (CAST SUPPLIES)
PADDING CAST ABS COTTON 4X4 ST (CAST SUPPLIES) ×1 IMPLANT
PADDING CAST COTTON 3X4 STRL (CAST SUPPLIES)
SPLINT PLASTER CAST XFAST 3X15 (CAST SUPPLIES) IMPLANT
SPLINT PLASTER XTRA FASTSET 3X (CAST SUPPLIES)
SPONGE GAUZE 4X4 12PLY (GAUZE/BANDAGES/DRESSINGS) ×2 IMPLANT
STOCKINETTE 4X48 STRL (DRAPES) ×2 IMPLANT
SUT ETHILON 4 0 PS 2 18 (SUTURE) IMPLANT
SWAB COLLECTION DEVICE MRSA (MISCELLANEOUS) ×1 IMPLANT
SYR BULB 3OZ (MISCELLANEOUS) ×2 IMPLANT
SYR CONTROL 10ML LL (SYRINGE) ×1 IMPLANT
TOWEL OR 17X24 6PK STRL BLUE (TOWEL DISPOSABLE) ×3 IMPLANT
TUBE ANAEROBIC SPECIMEN COL (MISCELLANEOUS) ×1 IMPLANT
TUBE FEEDING 5FR 15 INCH (TUBING) IMPLANT
UNDERPAD 30X30 INCONTINENT (UNDERPADS AND DIAPERS) ×2 IMPLANT

## 2013-11-14 NOTE — Transfer of Care (Signed)
Immediate Anesthesia Transfer of Care Note  Patient: Cindy [name]  Procedure(s) Performed: Procedure(s): IRRIGATION AND DEBRIDEMENT LEFT LONG FINGER (Left)  Patient Location: PACU  Anesthesia Type:General  Level of Consciousness: sedated  Airway & Oxygen Therapy: Patient Spontanous Breathing and Patient connected to face mask oxygen  Post-op Assessment: Report given to PACU RN and Post -op Vital signs reviewed and stable  Post vital signs: Reviewed and stable  Complications: No apparent anesthesia complications

## 2013-11-14 NOTE — Anesthesia Procedure Notes (Signed)
Procedure Name: LMA Insertion Performed by: Tawni Millers Pre-anesthesia Checklist: Patient identified, Emergency Drugs available, Suction available and Patient being monitored Patient Re-evaluated:Patient Re-evaluated prior to inductionOxygen Delivery Method: Circle System Utilized Preoxygenation: Pre-oxygenation with 100% oxygen Intubation Type: IV induction Ventilation: Mask ventilation without difficulty LMA: LMA inserted LMA Size: 3.0 Number of attempts: 1 Airway Equipment and Method: bite block Placement Confirmation: positive ETCO2 Tube secured with: Tape Dental Injury: Teeth and Oropharynx as per pre-operative assessment

## 2013-11-14 NOTE — ED Provider Notes (Signed)
CSN: [id number]     Arrival date & time 11/13/13  2346 History   First MD Initiated Contact with Patient 11/14/13 0010     Chief Complaint  Patient presents with  . Finger Injury   (Consider location/radiation/quality/duration/timing/severity/associated sxs/prior Treatment) HPI  Patient presents to the ED with complaints of infection to her left middle finger. She accidentally burned it on December 3rd, was seen in the ED and given Silvadene, she said her finger started to get infected, she saw her PCP yesterday who agreed and started abx. She endorses her finger being significantly worse and more swollen.  Past Medical History  Diagnosis Date  . UTI (lower urinary tract infection)   . Heart murmur   . Pneumonia 2008; 2012  . Complication of anesthesia     "I swallowed the anesthesia when appendix taken out; got pneumonia"  . Iron deficiency anemia   . History of blood transfusion 04/26/2013    "4 bags" (05/21/2013)  . Cancer 2014    Colon   Past Surgical History  Procedure Laterality Date  . Leg reconstruction using fascial flap  ~ 2009  . Still born baby  03/22/13    delivered vaginally @ ~ 24 weeks  . Appendectomy  2008  . Colonoscopy N/A 05/24/2013    Procedure: COLONOSCOPY;  Surgeon: Missy Sabins, MD;  Location: [place];  Service: Endoscopy;  Laterality: N/A;  . Colon surgery    . Ileostomy  06/26/2013   Family History  Problem Relation Age of Onset  . Adopted: Yes  . Alcohol abuse Neg Hx   . Arthritis Neg Hx   . Asthma Neg Hx   . Birth defects Neg Hx   . Cancer Neg Hx   . COPD Neg Hx   . Depression Neg Hx   . Diabetes Neg Hx   . Drug abuse Neg Hx   . Early death Neg Hx   . Hearing loss Neg Hx   . Heart disease Neg Hx   . Hyperlipidemia Neg Hx   . Hypertension Neg Hx   . Kidney disease Neg Hx   . Learning disabilities Neg Hx   . Mental illness Neg Hx   . Mental retardation Neg Hx   . Miscarriages / Stillbirths Neg Hx   . Stroke Neg Hx   . Vision loss  Neg Hx   . ALS Mother    History  Substance Use Topics  . Smoking status: Never Smoker   . Smokeless tobacco: Never Used  . Alcohol Use: No   OB History   Grav Para Term Preterm Abortions TAB SAB Ect Mult Living   1 1  1            Review of Systems The patient denies anorexia, fever, weight loss,, vision loss, decreased hearing, hoarseness, chest pain, syncope, dyspnea on exertion, peripheral edema, balance deficits, hemoptysis, abdominal pain, melena, hematochezia, severe indigestion/heartburn, hematuria, incontinence, genital sores, muscle weakness, suspicious skin lesions, transient blindness, difficulty walking, depression, unusual weight change, abnormal bleeding, enlarged lymph nodes, angioedema, and breast masses.  Allergies  Review of patient's allergies indicates no known allergies.  Home Medications   Current Outpatient Rx  Name  Route  Sig  Dispense  Refill  . acetaminophen (TYLENOL) 500 MG tablet   Oral   Take 2,000 mg by mouth every 4 (four) hours as needed for moderate pain.         . cephALEXin (KEFLEX) 500 MG capsule   Oral   Take  1 capsule (500 mg total) by mouth 4 (four) times daily.   20 capsule   0   . EPINEPHrine (EPIPEN) 0.3 mg/0.3 mL SOAJ injection   Intramuscular   Inject 0.3 mLs (0.3 mg total) into the muscle as needed.   2 Device   0   . HYDROcodone-acetaminophen (NORCO/VICODIN) 5-325 MG per tablet   Oral   Take 1 tablet by mouth every 6 (six) hours as needed for moderate pain.   20 tablet   0   . ibuprofen (ADVIL,MOTRIN) 200 MG tablet   Oral   Take 200 mg by mouth every 6 (six) hours as needed for moderate pain.         Marland Kitchen ibuprofen (ADVIL,MOTRIN) 800 MG tablet   Oral   Take 1 tablet (800 mg total) by mouth every 8 (eight) hours as needed.   21 tablet   0   . loperamide (IMODIUM) 1 MG/5ML solution   Oral   Take 15 mLs (3 mg total) by mouth 4 (four) times daily as needed for diarrhea or loose stools.   120 mL   3   . naproxen  sodium (ANAPROX) 220 MG tablet   Oral   Take 220 mg by mouth every 8 (eight) hours as needed (pain).         Marland Kitchen oxyCODONE-acetaminophen (PERCOCET/ROXICET) 5-325 MG per tablet   Oral   Take 1-2 tablets by mouth every 6 (six) hours as needed for severe pain.   20 tablet   0   . promethazine (PHENERGAN) 25 MG tablet   Oral   Take 1 tablet (25 mg total) by mouth every 6 (six) hours as needed for nausea or vomiting.   30 tablet   0   . silver sulfADIAZINE (SILVADENE) 1 % cream   Topical   Apply 1 application topically daily.   50 g   0   . sulfamethoxazole-trimethoprim (SEPTRA DS) 800-160 MG per tablet   Oral   Take 1 tablet by mouth 2 (two) times daily.   14 tablet   0    BP 113/77  Pulse 112  Temp(Src) 98.4 F (36.9 C) (Oral)  Resp 16  SpO2 98%  LMP 10/27/2013 Physical Exam  Nursing note and vitals reviewed. Constitutional: She appears well-developed and well-nourished. No distress.  HENT:  Head: Normocephalic and atraumatic.  Eyes: Pupils are equal, round, and reactive to light.  Neck: Normal range of motion. Neck supple.  Cardiovascular: Normal rate and regular rhythm.   Pulmonary/Chest: Effort normal.  Abdominal: Soft.  Musculoskeletal:       Left hand: She exhibits decreased range of motion, tenderness, bony tenderness and laceration.       Hands: Pt has large felon to middle left finger. IT is open and draining. Pt has significant tenderness to the distal top but it does not extend down to the MIP.  Neurological: She is alert.  Skin: Skin is warm and dry.    ED Course  Procedures (including critical care time) Labs Review Labs Reviewed  CBC WITH DIFFERENTIAL  BASIC METABOLIC PANEL   Imaging Review No results found.  EKG Interpretation   None       MDM   1. Felon, left    INCISION AND DRAINAGE Performed by: Linus Mako Consent: Verbal consent obtained. Risks and benefits: risks, benefits and alternatives were discussed Type:  abscess  Body area: left middle finger  Anesthesia: local infiltration  Incision was made with a scalpel.  Local anesthetic: lidocaine  2% wo epinephrine  Anesthetic total: 5 ml  Lateral incision  Drainage: purulent  Drainage amount: large   Patient tolerance: Patient tolerated the procedure well with no immediate complications.   Pt tolerated procedure well. Will switch from Amoxicillin as previously prescribed by PCP and place her on Bactrim and pain medication. Referral to Dr. Leanora Cover, I request the ysee him Monday or return to the ER if they can not be seen anywhere else for recheck.  22 y.o.Cindy [name]'s evaluation in the Emergency Department is complete. It has been determined that no acute conditions requiring further emergency intervention are present at this time. The patient/guardian have been advised of the diagnosis and plan. We have discussed signs and symptoms that warrant return to the ED, such as changes or worsening in symptoms.  Vital signs are stable at discharge. Filed Vitals:   11/14/13 0001  BP: 113/77  Pulse: 112  Temp: 98.4 F (36.9 C)  Resp: 16    Patient/guardian has voiced understanding and agreed to follow-up with the PCP or specialist.     Linus Mako, PA-C 11/14/13 0119

## 2013-11-14 NOTE — ED Notes (Signed)
Pt states that she's been vomiting since yesterday, she started amoxicillian and vicodin yesterday as well.

## 2013-11-14 NOTE — ED Notes (Signed)
PA @ beside doing a procedure. Will attempt lab draw when PA is finish.

## 2013-11-14 NOTE — Anesthesia Preprocedure Evaluation (Addendum)
Anesthesia Evaluation  Patient identified by MRN, date of birth, ID band Patient awake    Reviewed: Allergy & Precautions, H&P , NPO status , Patient's Chart, lab work & pertinent test results  History of Anesthesia Complications Negative for: history of anesthetic complications  Airway Mallampati: II TM Distance: >3 FB Neck ROM: full    Dental  (+) Teeth Intact and Dental Advidsory Given   Pulmonary neg pulmonary ROS, resolved,  breath sounds clear to auscultation  Pulmonary exam normal       Cardiovascular negative cardio ROS  Rhythm:regular Rate:Normal     Neuro/Psych negative neurological ROS  negative psych ROS   GI/Hepatic negative GI ROS, Neg liver ROS,   Endo/Other  negative endocrine ROS  Renal/GU negative Renal ROS     Musculoskeletal   Abdominal Normal abdominal exam  (+)   Peds  Hematology  (+) anemia ,   Anesthesia Other Findings   Reproductive/Obstetrics negative OB ROS                           Anesthesia Physical Anesthesia Plan  ASA: II  Anesthesia Plan: General LMA   Post-op Pain Management:    Induction:   Airway Management Planned:   Additional Equipment:   Intra-op Plan:   Post-operative Plan:   Informed Consent: I have reviewed the patients History and Physical, chart, labs and discussed the procedure including the risks, benefits and alternatives for the proposed anesthesia with the patient or authorized representative who has indicated his/her understanding and acceptance.   Dental Advisory Given  Plan Discussed with: Anesthesiologist, CRNA and Surgeon  Anesthesia Plan Comments:         Anesthesia Quick Evaluation

## 2013-11-14 NOTE — Op Note (Signed)
[id number] 

## 2013-11-14 NOTE — Anesthesia Postprocedure Evaluation (Signed)
Anesthesia Post Note  Patient: Cindy [name]  Procedure(s) Performed: Procedure(s) (LRB): IRRIGATION AND DEBRIDEMENT LEFT LONG FINGER (Left)  Anesthesia type: general  Patient location: PACU  Post pain: Pain level controlled  Post assessment: Patient's Cardiovascular Status Stable  Last Vitals:  Filed Vitals:   11/14/13 1730  BP: 100/58  Pulse: 88  Temp:   Resp: 26    Post vital signs: Reviewed and stable  Level of consciousness: sedated  Complications: No apparent anesthesia complications

## 2013-11-14 NOTE — Brief Op Note (Signed)
11/14/2013  3:38 PM  PATIENT:  Cindy [name]  23 y.o. female  PRE-OPERATIVE DIAGNOSIS:  LEFT LONG FINGER INFECTION & osteomyelitis  POST-OPERATIVE DIAGNOSIS:  LEFT LONG FINGER INFECTION & osteomyelitis  PROCEDURE:  Procedure(s): IRRIGATION AND DEBRIDEMENT LEFT LONG FINGER (Left) including osteomyelitis  SURGEON:  Surgeon(s) and Role:    * Tennis Must, MD - Primary  PHYSICIAN ASSISTANT:   ASSISTANTS: none   ANESTHESIA:   general  EBL:     BLOOD ADMINISTERED:none  DRAINS: iodoform packing  LOCAL MEDICATIONS USED:  MARCAINE     SPECIMEN:  Source of Specimen:  left long finger  DISPOSITION OF SPECIMEN:  micro  COUNTS:  YES  TOURNIQUET:   Total Tourniquet Time Documented: Upper Arm (Left) - 24 minutes Total: Upper Arm (Left) - 24 minutes   DICTATION: .Other Dictation: Dictation Number [id number]  PLAN OF CARE: Discharge to home after PACU  PATIENT DISPOSITION:  PACU - hemodynamically stable.

## 2013-11-14 NOTE — H&P (Signed)
Cindy [name] is an 22 y.o. female.   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  Seen 5 days later in ED where XR revealed avulsion fracture distal phalanx and topical treatment begun for burn.  Seen by PCP and ED since and has had increasing pain, swelling, erythema of finger.  I&D in ER last night.  No fevers, chills, night sweats.  Reports no previous problem with finger and no other injury at this time.  Past Medical History  Diagnosis Date  . UTI (lower urinary tract infection)   . Heart murmur   . Pneumonia 2008; 2012  . Complication of anesthesia     "I swallowed the anesthesia when appendix taken out; got pneumonia"  . Iron deficiency anemia   . History of blood transfusion 04/26/2013    "4 bags" (05/21/2013)  . Cancer 2014    Colon    Past Surgical History  Procedure Laterality Date  . Leg reconstruction using fascial flap  ~ 2009  . Still born baby  03/22/13    delivered vaginally @ ~ 24 weeks  . Appendectomy  2008  . Colonoscopy N/A 05/24/2013    Procedure: COLONOSCOPY;  Surgeon: Missy Sabins, MD;  Location: [facility];  Service: Endoscopy;  Laterality: N/A;  . Colon surgery    . Ileostomy  06/26/2013    Family History  Problem Relation Age of Onset  . Adopted: Yes  . Alcohol abuse Neg Hx   . Arthritis Neg Hx   . Asthma Neg Hx   . Birth defects Neg Hx   . Cancer Neg Hx   . COPD Neg Hx   . Depression Neg Hx   . Diabetes Neg Hx   . Drug abuse Neg Hx   . Early death Neg Hx   . Hearing loss Neg Hx   . Heart disease Neg Hx   . Hyperlipidemia Neg Hx   . Hypertension Neg Hx   . Kidney disease Neg Hx   . Learning disabilities Neg Hx   . Mental illness Neg Hx   . Mental retardation Neg Hx   . Miscarriages / Stillbirths Neg Hx   . Stroke Neg Hx   . Vision loss Neg Hx   . ALS Mother    Social History:  reports that she has never smoked. She has never used smokeless tobacco. She  reports that she does not drink alcohol or use illicit drugs.  Allergies: No Known Allergies  No prescriptions prior to admission    Results for orders placed during the hospital encounter of 11/13/13 (from the past 48 hour(s))  CBC WITH DIFFERENTIAL     Status: Abnormal   Collection Time    11/14/13  1:12 AM      Result Value Range   WBC 12.0 (*) 4.0 - 10.5 K/uL   RBC 4.39  3.87 - 5.11 MIL/uL   Hemoglobin 11.7 (*) 12.0 - 15.0 g/dL   HCT 35.5 (*) 36.0 - 46.0 %   MCV 80.9  78.0 - 100.0 fL   MCH 26.7  26.0 - 34.0 pg   MCHC 33.0  30.0 - 36.0 g/dL   RDW 14.3  11.5 - 15.5 %   Platelets 332  150 - 400 K/uL   Neutrophils Relative % 75  43 - 77 %   Neutro Abs 9.0 (*) 1.7 - 7.7 K/uL   Lymphocytes Relative 14  12 - 46 %  Lymphs Abs 1.6  0.7 - 4.0 K/uL   Monocytes Relative 10  3 - 12 %   Monocytes Absolute 1.2 (*) 0.1 - 1.0 K/uL   Eosinophils Relative 1  0 - 5 %   Eosinophils Absolute 0.1  0.0 - 0.7 K/uL   Basophils Relative 0  0 - 1 %   Basophils Absolute 0.0  0.0 - 0.1 K/uL  BASIC METABOLIC PANEL     Status: Abnormal   Collection Time    11/14/13  1:12 AM      Result Value Range   Sodium 136  135 - 145 mEq/L   Potassium 3.3 (*) 3.5 - 5.1 mEq/L   Chloride 109  96 - 112 mEq/L   CO2 14 (*) 19 - 32 mEq/L   Glucose, Bld 114 (*) 70 - 99 mg/dL   BUN 15  6 - 23 mg/dL   Creatinine, Ser 1.41 (*) 0.50 - 1.10 mg/dL   Calcium 9.3  8.4 - 10.5 mg/dL   GFR calc non Af Amer 52 (*) >90 mL/min   GFR calc Af Amer 60 (*) >90 mL/min   Comment: (NOTE)     The eGFR has been calculated using the CKD EPI equation.     This calculation has not been validated in all clinical situations.     eGFR's persistently <90 mL/min signify possible Chronic Kidney     Disease.    Dg Finger Middle Left  11/14/2013   CLINICAL DATA:  Fractured finger 2 weeks ago, finger now infected  EXAM: LEFT MIDDLE FINGER 2+V  COMPARISON:  11/03/2013  FINDINGS: Osseous mineralization normal.  Joint spaces preserved.  Again  identified fracture at radial aspect base of distal phalanx with intra-articular extension.  Newly identified significant soft tissue swelling of the left little finger greatest distally.  Loss of cortical margination and bone destruction at the tuft of the distal phalanx consistent with osteomyelitis.  No additional focal bony abnormalities identified.  IMPRESSION: Bone destruction at the tuft of the distal phalanx left middle finger consistent with osteomyelitis.  Nondisplaced intra-articular fracture at radial aspect base of distal phalanx left middle finger.   Electronically Signed   By: Lavonia Dana M.D.   On: 11/14/2013 01:24     A comprehensive review of systems was negative.  Last menstrual period 10/27/2013.  General appearance: alert, cooperative and appears stated age Head: Normocephalic, without obvious abnormality, atraumatic Neck: supple, symmetrical, trachea midline Resp: clear to auscultation bilaterally Cardio: regular rate and rhythm GI: non tender Extremities: ttp left long finger at distal and middle phalanges.  erythema distal and middle phalanges and distall of proximal phalanx.  no proximal phalanx or hand tenderness.  able to wiggle finger.  no swelling proximal or middle phalanges.  purulent drainage distal phalanx. Pulses: 2+ and symmetric Skin: as above Neurologic: Grossly normal Incision/Wound: As above  Assessment/Plan Left long finger abscess and osteomyelitis.  Non operative and operative treatment options were discussed with the patient and patient wishes to proceed with operative treatment. Risks, benefits, and alternatives of surgery were discussed and the patient agrees with the plan of care.   Cindy [name] 11/14/2013, 1:33 PM

## 2013-11-14 NOTE — ED Notes (Signed)
Pt injured her finger Dec. 3rd and now it's infected

## 2013-11-14 NOTE — ED Provider Notes (Signed)
The patient presents with left middle finger swelling after having an accidental burn approximately 2 weeks ago. On exam she has distal finger tuft swelling with fluctuance and purulence extruding from a distal finger opening. Digital block performed by physician Asst. Green, drainage completed, antibiotic started, patient appears stable followup with hand surgery.  Medical screening examination/treatment/procedure(s) were conducted as a shared visit with non-physician practitioner(s) and myself.  I personally evaluated the patient during the encounter.       Johnna Acosta, MD 11/14/13 [phone]

## 2013-11-15 NOTE — Op Note (Signed)
NAMEARIAS, BARMES NO.:  [id number]  MEDICAL RECORD NO.:  [id number]  LOCATION:                                 FACILITY:  PHYSICIAN:  Leanora Cover, MD        DATE OF BIRTH:  [birth year]  DATE OF PROCEDURE:  11/14/2013 DATE OF DISCHARGE:                              OPERATIVE REPORT   PREOPERATIVE DIAGNOSIS:  Left long finger abscess and osteomyelitis.  POSTOPERATIVE DIAGNOSIS:  Left long finger abscess and osteomyelitis.  PROCEDURE:  Left long finger irrigation and debridement of abscess and osteomyelitis distal phalanx.  SURGEON:  Leanora Cover, MD  ASSISTANT:  None.  ANESTHESIA:  General.  IV FLUIDS:  Per anesthesia flow sheet.  ESTIMATED BLOOD LOSS:  Minimal.  COMPLICATIONS:  None.  SPECIMENS:  Cultures to Micro and left long finger.  TOURNIQUET TIME:  24 minutes.  DISPOSITION:  Stable to PACU.  INDICATIONS:  Ms. Autry is a 22 year old female who approximately 2 weeks ago burned her left long finger.  She has been seen by her primary care physician in the emergency department on multiple occasions. Incision and drainage was performed last night by the emergency department.  She was referred to me for further care.  She was seen in the office.  There was purulent drainage from the finger as well as significant erythema.  I discussed with Ms. Job and her mother who was with her the nature of the condition.  I recommended irrigation and debridement of the left long finger abscess and osteomyelitis in the operating room.  Risks, benefits, and alternatives of surgery were discussed including risk of blood loss, infection, damage to nerves, vessels, tendons, ligaments, bone, failure of surgery, need for additional surgery, complications with wound healing, continued pain, continued infection, potential need for repeat irrigation, debridement, or amputation.  They voiced understanding of these risks and elected to proceed.  OPERATIVE COURSE:   After being identified preoperatively by myself, the patient, the patient's mother, and I agreed upon procedure and site of procedure.  Surgical site was marked.  The risks, benefits, and alternatives of surgery were reviewed and she wished to proceed. Surgical consent had been signed.  She was transported to the operating room and placed on the operating table in a supine position with left upper extremity on the arm board.  General anesthesia was induced by the anesthesiologist.  Left upper extremity was prepped and draped in normal sterile orthopedic fashion.  Surgical pause was performed between surgeons, anesthesia, and operating room staff, and all were in agreement as to the patient, procedure, and site of procedure. Tourniquet at the proximal aspect of the extremity was inflated to 250 mmHg after gravity exsanguination of the hand and Esmarch exsanguination the forearm.  The distal phalanx of the finger was squeezed and purulent drainage was expressed.  Cultures were taken for aerobes, anaerobes, AFB, and fungus.  The superficial layers of the skin had delaminated and dried out.  These were surgically debrided with the scissors sharply. There was skin remaining underneath.  There was a hole at the distal aspect of the phalanx as well as 2 holes in the pad of the finger.  An  incision presumably from the incision and drainage last night was present on the radial side of the finger.  The nail was removed.  There was no purulent drainage from under the nail.  A small incision was made at the dorsum of the finger where there was significant erythema.  There was no purulence from within the dorsal tissues.  A small incision was made over the middle phalanx again where there was significant erythema. There was no purulence in these tissues either.  The purulence seemed to be localized to the distal phalanx.  There was significant fatty necrosis.  This was all removed using the rongeurs  and scraping with the Ray-Tec sponge.  The bone at the tip of the tuft had eroded.  This was removed with the rongeurs as well.  The wounds were copiously irrigated with 1000 mL of sterile saline by bulb syringe.  All wounds were packed with quarter-inch iodoform gauze.  A piece of Xeroform was placed in the nail fold and all wounds were dressed with sterile Xeroform.  They were then dressed with sterile 4 x 4 and wrapped with a Coban dressing lightly.  A digital block had been performed with 10 mL of 0.25% plain Marcaine to aid in postoperative analgesia.  The operative drapes were broken down.  The tourniquet was deflated at 24 minutes.  The patient was awoken from anesthesia safely.  She was transferred back to the stretcher and taken to PACU in stable condition.  I will see her back in the office next week for postprocedure followup.  She will be given Percocet 5/325 one to two p.o. q.6 hours p.r.n. pain, dispensed #30 and she will continue on Bactrim DS 1 p.o. b.i.d., which was prescribed by the emergency department yesterday.  We will also give her a dose of vancomycin prior to her discharge from here today.  We worked in a referral to Infectious Disease early next week.     Leanora Cover, MD     KK/MEDQ  D:  11/14/2013  T:  11/15/2013  Job:  [id number]

## 2013-11-17 LAB — CULTURE, ROUTINE-ABSCESS

## 2013-11-18 ENCOUNTER — Ambulatory Visit (INDEPENDENT_AMBULATORY_CARE_PROVIDER_SITE_OTHER): Payer: Worker's Compensation | Admitting: Internal Medicine

## 2013-11-18 ENCOUNTER — Encounter: Payer: Self-pay | Admitting: Internal Medicine

## 2013-11-18 VITALS — BP 120/73 | HR 86 | Temp 98.3°F | Wt 99.0 lb

## 2013-11-18 DIAGNOSIS — M869 Osteomyelitis, unspecified: Secondary | ICD-10-CM

## 2013-11-18 DIAGNOSIS — A4902 Methicillin resistant Staphylococcus aureus infection, unspecified site: Secondary | ICD-10-CM

## 2013-11-18 LAB — CBC WITH DIFFERENTIAL/PLATELET
Basophils Relative: 0 % (ref 0–1)
Eosinophils Absolute: 0.1 10*3/uL (ref 0.0–0.7)
HCT: 34.1 % — ABNORMAL LOW (ref 36.0–46.0)
Hemoglobin: 11 g/dL — ABNORMAL LOW (ref 12.0–15.0)
Lymphocytes Relative: 44 % (ref 12–46)
Lymphs Abs: 3.1 10*3/uL (ref 0.7–4.0)
MCHC: 32.3 g/dL (ref 30.0–36.0)
Monocytes Relative: 8 % (ref 3–12)
Neutro Abs: 3.2 10*3/uL (ref 1.7–7.7)
Neutrophils Relative %: 46 % (ref 43–77)
Platelets: 395 10*3/uL (ref 150–400)
RBC: 4.16 MIL/uL (ref 3.87–5.11)

## 2013-11-18 LAB — BASIC METABOLIC PANEL WITH GFR
CO2: 15 mEq/L — ABNORMAL LOW (ref 19–32)
Calcium: 8.9 mg/dL (ref 8.4–10.5)
Chloride: 109 mEq/L (ref 96–112)
GFR, Est African American: 79 mL/min
Glucose, Bld: 82 mg/dL (ref 70–99)
Potassium: 3.9 mEq/L (ref 3.5–5.3)
Sodium: 135 mEq/L (ref 135–145)

## 2013-11-18 LAB — C-REACTIVE PROTEIN: CRP: <0.5 mg/dL (ref <0.60)

## 2013-11-18 LAB — SEDIMENTATION RATE: Sed Rate: 40 mm/hr — ABNORMAL HIGH (ref 0–22)

## 2013-11-18 MED ORDER — PROMETHAZINE HCL 25 MG PO TABS: 25.0000 mg | ORAL_TABLET | Freq: Four times a day (QID) | ORAL | Status: DC | PRN

## 2013-11-18 MED ORDER — OXYCODONE-ACETAMINOPHEN 5-325 MG PO TABS: ORAL_TABLET | ORAL | Status: DC

## 2013-11-18 MED ORDER — DOXYCYCLINE HYCLATE 100 MG PO TABS: 100.0000 mg | ORAL_TABLET | Freq: Two times a day (BID) | ORAL | Status: DC

## 2013-11-18 NOTE — Progress Notes (Signed)
Subjective:    Patient ID: Cindy [name], female    DOB: 01-11-1991, 22 y.o.   MRN: [id number]  HPI 22yo right-handed F with recent diagnosis of colon ca s/p colectomy, ileostomy who  sustained a work place injury, where she burned and fractured her long finger of her left hand on 12/3 and had numerous ED visits - 5 to evaluate her injury. Predominantly treated with topical ointments and pain medication. Did receive course of amox for 1 day. She eventually was referred to Dr. Fredna Dow on 5 days ago and taken to the OR that day for debridement on 12/19. cx showed MRSA. Placed on bactrim ds 1 tab bid.she reports that she saw dr. Fredna Dow yesterday and her wound is looking good. She goes back tomorrow.  She is on leave from work from [employer]. Has been paying out of pocket despite being workers comp case.  No fever, chills, nightsweats  No Known Allergies   Current Outpatient Prescriptions on File Prior to Visit  Medication Sig Dispense Refill  . EPINEPHrine (EPIPEN) 0.3 mg/0.3 mL SOAJ injection Inject 0.3 mLs (0.3 mg total) into the muscle as needed.  2 Device  0  . ibuprofen (ADVIL,MOTRIN) 800 MG tablet Take 1 tablet (800 mg total) by mouth every 8 (eight) hours as needed.  21 tablet  0  . loperamide (IMODIUM) 1 MG/5ML solution Take 15 mLs (3 mg total) by mouth 4 (four) times daily as needed for diarrhea or loose stools.  120 mL  3  . oxyCODONE-acetaminophen (PERCOCET) 5-325 MG per tablet 1-2 tabs po q6 hours prn pain  30 tablet  0  . promethazine (PHENERGAN) 25 MG tablet Take 1 tablet (25 mg total) by mouth every 6 (six) hours as needed for nausea or vomiting.  30 tablet  0  . sulfamethoxazole-trimethoprim (SEPTRA DS) 800-160 MG per tablet Take 1 tablet by mouth 2 (two) times daily.  14 tablet  0   No current facility-administered medications on file prior to visit.   Active Ambulatory Problems    Diagnosis Date Noted  . Microcytic anemia 04/25/2013  . Leukocytosis 04/25/2013  . Hypokalemia  04/25/2013  . Colonic mass 05/21/2013  . Nausea with vomiting 05/21/2013  . Sinus congestion 05/26/2013  . Adenocarcinoma of colon 05/26/2013  . Colon polyps 05/26/2013  . CAP (community acquired pneumonia) 05/27/2013  . Acute renal failure 09/23/2013   Resolved Ambulatory Problems    Diagnosis Date Noted  . Nephrolithiasis 11/10/2011  . Nausea & vomiting 11/10/2011  . Pyelonephritis 11/14/2011  . Pneumonia 11/14/2011   Past Medical History  Diagnosis Date  . UTI (lower urinary tract infection)   . Heart murmur   . Complication of anesthesia   . Iron deficiency anemia   . History of blood transfusion 04/26/2013  . Cancer 2014   History  Substance Use Topics  . Smoking status: Never Smoker   . Smokeless tobacco: Never Used  . Alcohol Use: No  family history includes ALS in her mother. There is no history of Alcohol abuse, Arthritis, Asthma, Birth defects, Cancer, COPD, Depression, Diabetes, Drug abuse, Early death, Hearing loss, Heart disease, Hyperlipidemia, Hypertension, Kidney disease, Learning disabilities, Mental illness, Mental retardation, Miscarriages / Stillbirths, Stroke, or Vision loss. She was adopted.  Review of Systems  Constitutional: Negative for fever, chills, diaphoresis, activity change, appetite change, fatigue and + unexpected weight change due to malignancy.  HENT: Negative for congestion, sore throat, rhinorrhea, sneezing, trouble swallowing and sinus pressure.  Eyes: Negative for  photophobia and visual disturbance.  Respiratory: Negative for cough, chest tightness, shortness of breath, wheezing and stridor.  Cardiovascular: Negative for chest pain, palpitations and leg swelling.  Gastrointestinal: Negative for nausea, vomiting, abdominal pain, diarrhea, constipation, blood in stool, abdominal distention and anal bleeding.  Genitourinary: Negative for dysuria, hematuria, flank pain and difficulty urinating.  Musculoskeletal: Negative for myalgias, back  pain, joint swelling, arthralgias and gait problem.  Skin: left long finger injury per hpi. Neurological: Negative for dizziness, tremors, weakness and light-headedness.  Hematological: Negative for adenopathy. Does not bruise/bleed easily.  Psychiatric/Behavioral: Negative for behavioral problems, confusion, sleep disturbance, dysphoric mood, decreased concentration and agitation.       Objective:   Physical Exam BP 120/73  Pulse 86  Temp(Src) 98.3 F (36.8 C) (Oral)  Wt 99 lb (44.906 kg)  LMP 10/27/2013  Constitutional:  oriented to person, place, and time.  appears well-developed and well-nourished. No distress.  HENT:  Mouth/Throat: Oropharynx is clear and moist. No oropharyngeal exudate.  Cardiovascular: Normal rate, regular rhythm and normal heart sounds. Exam reveals no gallop and no friction rub.  No murmur heard.  Pulmonary/Chest: Effort normal and breath sounds normal. No respiratory distress. no wheezes.  Abdominal: Soft. Bowel sounds are normal.  exhibits no distension. no tenderness. Ileostomy in the right lower quadrant. Midline scar.  Lymphadenopathy:  no cervical adenopathy.  Ext: left long finger wrapped Neurological:  alert and oriented to person, place, and time.  Skin: Skin is warm and dry. No rash noted. No erythema.  Psychiatric:  a normal mood and affect.  behavior is normal.       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 Will switch from bactrim due to her AKI.  We will check sed rate and crp today in addn to cbc with diff and bmp.  aki = will check bmp  rtc in 4 wk.  Cc: Fredna Dow

## 2013-11-19 LAB — ANAEROBIC CULTURE

## 2013-12-04 ENCOUNTER — Ambulatory Visit: Payer: Self-pay | Admitting: Infectious Disease

## 2013-12-15 ENCOUNTER — Telehealth: Payer: Self-pay | Admitting: *Deleted

## 2013-12-15 NOTE — Telephone Encounter (Signed)
Pt states that her left middle finger "hurts when it gets cold outside."  Requesting refill for pain rx.  Pt is continuing to be seen at Dr Levell July office for rehab.  Please adivse.

## 2013-12-16 NOTE — Telephone Encounter (Signed)
Please have her see Cindy [name] about pain mgmt. thx

## 2013-12-17 NOTE — Telephone Encounter (Addendum)
Patient called back as she did not get a return call about her pain medication. Advised her Dr Baxter Flattery request that she calls Dr Fredna Dow about pain management. She advised she will do so. Advised her if she has any question or issues with her antibiotics she can call here and we will do what we can to help.

## 2013-12-25 ENCOUNTER — Encounter (HOSPITAL_COMMUNITY): Payer: Self-pay | Admitting: Emergency Medicine

## 2013-12-25 ENCOUNTER — Inpatient Hospital Stay (HOSPITAL_COMMUNITY)
Admission: EM | Admit: 2013-12-25 | Discharge: 2013-12-27 | DRG: 682 | Disposition: A | Discharge status: Home / Self Care | Payer: No Typology Code available for payment source | Attending: Internal Medicine | Admitting: Internal Medicine

## 2013-12-25 DIAGNOSIS — E860 Dehydration: Secondary | ICD-10-CM

## 2013-12-25 DIAGNOSIS — Z9049 Acquired absence of other specified parts of digestive tract: Secondary | ICD-10-CM

## 2013-12-25 DIAGNOSIS — D509 Iron deficiency anemia, unspecified: Secondary | ICD-10-CM | POA: Diagnosis present

## 2013-12-25 DIAGNOSIS — J189 Pneumonia, unspecified organism: Secondary | ICD-10-CM

## 2013-12-25 DIAGNOSIS — M869 Osteomyelitis, unspecified: Secondary | ICD-10-CM

## 2013-12-25 DIAGNOSIS — N179 Acute kidney failure, unspecified: Principal | ICD-10-CM

## 2013-12-25 DIAGNOSIS — Z681 Body mass index (BMI) 19 or less, adult: Secondary | ICD-10-CM

## 2013-12-25 DIAGNOSIS — C189 Malignant neoplasm of colon, unspecified: Secondary | ICD-10-CM

## 2013-12-25 DIAGNOSIS — R000 Tachycardia, unspecified: Secondary | ICD-10-CM | POA: Diagnosis present

## 2013-12-25 DIAGNOSIS — R0981 Nasal congestion: Secondary | ICD-10-CM

## 2013-12-25 DIAGNOSIS — E872 Acidosis, unspecified: Secondary | ICD-10-CM | POA: Diagnosis present

## 2013-12-25 DIAGNOSIS — Z79899 Other long term (current) drug therapy: Secondary | ICD-10-CM

## 2013-12-25 DIAGNOSIS — I959 Hypotension, unspecified: Secondary | ICD-10-CM

## 2013-12-25 DIAGNOSIS — E43 Unspecified severe protein-calorie malnutrition: Secondary | ICD-10-CM | POA: Diagnosis present

## 2013-12-25 DIAGNOSIS — Z85038 Personal history of other malignant neoplasm of large intestine: Secondary | ICD-10-CM

## 2013-12-25 DIAGNOSIS — Z792 Long term (current) use of antibiotics: Secondary | ICD-10-CM

## 2013-12-25 DIAGNOSIS — E871 Hypo-osmolality and hyponatremia: Secondary | ICD-10-CM | POA: Diagnosis present

## 2013-12-25 DIAGNOSIS — Z932 Ileostomy status: Secondary | ICD-10-CM

## 2013-12-25 DIAGNOSIS — R112 Nausea with vomiting, unspecified: Secondary | ICD-10-CM | POA: Diagnosis present

## 2013-12-25 DIAGNOSIS — R571 Hypovolemic shock: Secondary | ICD-10-CM

## 2013-12-25 DIAGNOSIS — K6389 Other specified diseases of intestine: Secondary | ICD-10-CM

## 2013-12-25 DIAGNOSIS — K635 Polyp of colon: Secondary | ICD-10-CM

## 2013-12-25 DIAGNOSIS — D75839 Thrombocytosis, unspecified: Secondary | ICD-10-CM

## 2013-12-25 DIAGNOSIS — D72829 Elevated white blood cell count, unspecified: Secondary | ICD-10-CM | POA: Diagnosis present

## 2013-12-25 DIAGNOSIS — D473 Essential (hemorrhagic) thrombocythemia: Secondary | ICD-10-CM | POA: Diagnosis present

## 2013-12-25 DIAGNOSIS — E876 Hypokalemia: Secondary | ICD-10-CM | POA: Diagnosis present

## 2013-12-25 DIAGNOSIS — D518 Other vitamin B12 deficiency anemias: Secondary | ICD-10-CM | POA: Diagnosis present

## 2013-12-25 DIAGNOSIS — R578 Other shock: Secondary | ICD-10-CM | POA: Diagnosis present

## 2013-12-25 HISTORY — DX: Malignant neoplasm of colon, unspecified: C18.9

## 2013-12-25 LAB — COMPREHENSIVE METABOLIC PANEL
ALT: 15 U/L (ref 0–35)
AST: 20 U/L (ref 0–37)
Albumin: 5 g/dL (ref 3.5–5.2)
Alkaline Phosphatase: 129 U/L — ABNORMAL HIGH (ref 39–117)
BILIRUBIN TOTAL: 0.4 mg/dL (ref 0.3–1.2)
BUN: 38 mg/dL — AB (ref 6–23)
CO2: 12 mEq/L — ABNORMAL LOW (ref 19–32)
Calcium: 9.7 mg/dL (ref 8.4–10.5)
Chloride: 93 mEq/L — ABNORMAL LOW (ref 96–112)
Creatinine, Ser: 2.11 mg/dL — ABNORMAL HIGH (ref 0.50–1.10)
GFR calc non Af Amer: 32 mL/min — ABNORMAL LOW (ref >90)
GFR, EST AFRICAN AMERICAN: 37 mL/min — AB (ref >90)
GLUCOSE: 109 mg/dL — AB (ref 70–99)
Potassium: 3.8 mEq/L (ref 3.7–5.3)
Sodium: 127 mEq/L — ABNORMAL LOW (ref 137–147)
TOTAL PROTEIN: 9.9 g/dL — AB (ref 6.0–8.3)

## 2013-12-25 LAB — URINALYSIS, ROUTINE W REFLEX MICROSCOPIC
BILIRUBIN URINE: NEGATIVE
Glucose, UA: NEGATIVE mg/dL
KETONES UR: NEGATIVE mg/dL
NITRITE: NEGATIVE
PROTEIN: 30 mg/dL — AB
Specific Gravity, Urine: 1.012 (ref 1.005–1.030)
UROBILINOGEN UA: 0.2 mg/dL (ref 0.0–1.0)
pH: 6 (ref 5.0–8.0)

## 2013-12-25 LAB — TYPE AND SCREEN
ABO/RH(D): A POS
Antibody Screen: NEGATIVE

## 2013-12-25 LAB — MRSA PCR SCREENING: MRSA by PCR: NEGATIVE

## 2013-12-25 LAB — TROPONIN I: Troponin I: <0.3 ng/mL (ref <0.30)

## 2013-12-25 LAB — CG4 I-STAT (LACTIC ACID): Lactic Acid, Venous: 1.49 mmol/L (ref 0.5–2.2)

## 2013-12-25 LAB — CBC WITH DIFFERENTIAL/PLATELET
BASOS PCT: 0 % (ref 0–1)
Basophils Absolute: 0 10*3/uL (ref 0.0–0.1)
EOS PCT: 0 % (ref 0–5)
Eosinophils Absolute: 0 10*3/uL (ref 0.0–0.7)
HCT: 44 % (ref 36.0–46.0)
Hemoglobin: 15.4 g/dL — ABNORMAL HIGH (ref 12.0–15.0)
Lymphocytes Relative: 10 % — ABNORMAL LOW (ref 12–46)
Lymphs Abs: 1.4 10*3/uL (ref 0.7–4.0)
MCH: 28.3 pg (ref 26.0–34.0)
MCHC: 35 g/dL (ref 30.0–36.0)
MCV: 80.7 fL (ref 78.0–100.0)
MONOS PCT: 4 % (ref 3–12)
Monocytes Absolute: 0.6 10*3/uL (ref 0.1–1.0)
NEUTROS PCT: 86 % — AB (ref 43–77)
Neutro Abs: 11.5 10*3/uL — ABNORMAL HIGH (ref 1.7–7.7)
PLATELETS: 513 10*3/uL — AB (ref 150–400)
RBC: 5.45 MIL/uL — ABNORMAL HIGH (ref 3.87–5.11)
RDW: 15.1 % (ref 11.5–15.5)
WBC: 13.5 10*3/uL — ABNORMAL HIGH (ref 4.0–10.5)

## 2013-12-25 LAB — CLOSTRIDIUM DIFFICILE BY PCR: Toxigenic C. Difficile by PCR: NEGATIVE

## 2013-12-25 LAB — PHOSPHORUS: Phosphorus: 7.2 mg/dL — ABNORMAL HIGH (ref 2.3–4.6)

## 2013-12-25 LAB — URINE MICROSCOPIC-ADD ON

## 2013-12-25 LAB — POCT PREGNANCY, URINE: PREG TEST UR: NEGATIVE

## 2013-12-25 LAB — MAGNESIUM: Magnesium: 2.5 mg/dL (ref 1.5–2.5)

## 2013-12-25 MED ORDER — PROMETHAZINE HCL 25 MG/ML IJ SOLN
25.0000 mg | Freq: Once | INTRAMUSCULAR | Status: AC
  Administered 2013-12-25: 25 mg via INTRAVENOUS
  Filled 2013-12-25: qty 1

## 2013-12-25 MED ORDER — SODIUM CHLORIDE 0.9 % IV BOLUS (SEPSIS): 1000.0000 mL | Freq: Once | INTRAVENOUS | Status: AC

## 2013-12-25 MED ORDER — SODIUM CHLORIDE 0.9 % IV BOLUS (SEPSIS)
1000.0000 mL | Freq: Once | INTRAVENOUS | Status: AC
  Administered 2013-12-25: 1000 mL via INTRAVENOUS

## 2013-12-25 MED ORDER — ENOXAPARIN SODIUM 30 MG/0.3ML ~~LOC~~ SOLN
30.0000 mg | SUBCUTANEOUS | Status: DC
  Administered 2013-12-25 – 2013-12-26 (×2): 30 mg via SUBCUTANEOUS
  Filled 2013-12-25 (×3): qty 0.3

## 2013-12-25 MED ORDER — ACETAMINOPHEN 650 MG RE SUPP
650.0000 mg | Freq: Four times a day (QID) | RECTAL | Status: DC | PRN
Start: 2013-12-25 — End: 2013-12-27

## 2013-12-25 MED ORDER — PROCHLORPERAZINE EDISYLATE 5 MG/ML IJ SOLN: 10.0000 mg | Freq: Four times a day (QID) | INTRAMUSCULAR | Status: DC | PRN

## 2013-12-25 MED ORDER — PROCHLORPERAZINE EDISYLATE 5 MG/ML IJ SOLN
10.0000 mg | Freq: Once | INTRAMUSCULAR | Status: AC
  Administered 2013-12-25: 10 mg via INTRAVENOUS
  Filled 2013-12-25: qty 2

## 2013-12-25 MED ORDER — SODIUM CHLORIDE 0.9 % IV SOLN
INTRAVENOUS | Status: DC
  Administered 2013-12-25: 18:00:00 via INTRAVENOUS

## 2013-12-25 MED ORDER — LOPERAMIDE HCL 1 MG/5ML PO LIQD
4.0000 mg | Freq: Three times a day (TID) | ORAL | Status: DC
  Administered 2013-12-25 – 2013-12-27 (×5): 4 mg via ORAL
  Filled 2013-12-25 (×7): qty 20

## 2013-12-25 MED ORDER — ONDANSETRON HCL 4 MG/2ML IJ SOLN: 4.0000 mg | Freq: Four times a day (QID) | INTRAMUSCULAR | Status: DC

## 2013-12-25 MED ORDER — ZOLPIDEM TARTRATE 5 MG PO TABS
5.0000 mg | ORAL_TABLET | Freq: Every day | ORAL | Status: DC
  Administered 2013-12-25 – 2013-12-26 (×2): 5 mg via ORAL
  Filled 2013-12-25 (×2): qty 1

## 2013-12-25 MED ORDER — PROMETHAZINE HCL 25 MG PO TABS: 25.0000 mg | ORAL_TABLET | Freq: Four times a day (QID) | ORAL | Status: DC | PRN

## 2013-12-25 MED ORDER — DOXYCYCLINE HYCLATE 100 MG PO TABS
100.0000 mg | ORAL_TABLET | Freq: Two times a day (BID) | ORAL | Status: DC
  Administered 2013-12-25 – 2013-12-27 (×4): 100 mg via ORAL
  Filled 2013-12-25 (×5): qty 1

## 2013-12-25 MED ORDER — ONDANSETRON HCL 4 MG/2ML IJ SOLN: 4.0000 mg | Freq: Three times a day (TID) | INTRAMUSCULAR | Status: DC | PRN

## 2013-12-25 MED ORDER — ONDANSETRON HCL 4 MG/2ML IJ SOLN
4.0000 mg | Freq: Four times a day (QID) | INTRAMUSCULAR | Status: DC
  Administered 2013-12-25 – 2013-12-26 (×3): 4 mg via INTRAVENOUS
  Filled 2013-12-25 (×3): qty 2

## 2013-12-25 MED ORDER — SODIUM CHLORIDE 0.9 % IV SOLN
INTRAVENOUS | Status: DC
  Administered 2013-12-25 – 2013-12-26 (×3): via INTRAVENOUS

## 2013-12-25 MED ORDER — ACETAMINOPHEN 325 MG PO TABS: 650.0000 mg | ORAL_TABLET | Freq: Four times a day (QID) | ORAL | Status: DC | PRN

## 2013-12-25 MED ORDER — ONDANSETRON HCL 4 MG/2ML IJ SOLN
4.0000 mg | Freq: Once | INTRAMUSCULAR | Status: AC
  Administered 2013-12-25: 4 mg via INTRAVENOUS
  Filled 2013-12-25: qty 2

## 2013-12-25 MED ORDER — SODIUM CHLORIDE 0.9 % IJ SOLN
3.0000 mL | Freq: Two times a day (BID) | INTRAMUSCULAR | Status: DC
  Administered 2013-12-26 – 2013-12-27 (×2): 3 mL via INTRAVENOUS

## 2013-12-25 NOTE — H&P (Addendum)
[facility] History and Physical  Cindy [name] [id number] DOB: [birth year] DOA: 12/25/2013  Referring physician:  Joseph Berkshire PCP:  Vidal Schwalbe, MD   Chief Complaint:  Nausea and vomiting HPI:  The patient is a 23 y.o. year-old female with history of colon cancer s/p colectomy with ileostomy 05/2013, iron deficiency anemia, and left third finger osteomyelitis who presents with a two day history of nausea, vomiting and inability to tolerate food or beverage.  The patient was last at their baseline health 3-4 days ago.  She normally has watery stools and empties her ostomy bag 5-6 times per day.  The frequency and consistency of her stools has not changed.  She denies blood in her stools and abdominal pain.  For the last two days, however, she has had nausea and vomiting, NBNB, about 3 times per day and inability to eat or drink.  She denies fevers, URI symptoms, cough, dysuria.  She is taking doxycycline for left third finger osteomyelitis and has been on antibiotics for > 5 weeks.  She presented to her PCP office today and she was feeling lightheaded and fatigued.  Her SBP was 60.  She was brought to the ER where initial BP was 90/57, HR 145, sinus tachycardia.  In ER, labs were notable for WBC 13.5, hgb 15.4, plt 513, Na 127, CO2 12, BUN 38, creatinine 2.11.  UA is pending.  She has been started on IVF and after 1.5 liters, her BP is now 110s and HR 110s.  She is being admitted for severe dehydration, AKI, and electrolyte disturbance.    Review of Systems:  General:  Denies fevers, chills, weight loss or gain HEENT:  Denies changes to hearing and vision, rhinorrhea, sinus congestion, sore throat CV:  Denies chest pain and palpitations, lower extremity edema.  PULM:  + SOB today GI:  Per HPI GU:  Denies dysuria, frequency, urgency ENDO:  Denies polyuria, polydipsia.   HEME:  Denies hematemesis, blood in stools, melena, abnormal bruising or bleeding.  LYMPH:  Denies  lymphadenopathy.   MSK:  Denies arthralgias, myalgias.   DERM:  Denies skin rash or ulcer.   NEURO:  Denies focal numbness, weakness, slurred speech, confusion, facial droop.  PSYCH:  Denies anxiety and depression.    Past Medical History  Diagnosis Date  . UTI (lower urinary tract infection)   . Heart murmur   . Pneumonia 2008; 2012  . Complication of anesthesia     "I swallowed the anesthesia when appendix taken out; got pneumonia"  . Iron deficiency anemia   . History of blood transfusion 04/26/2013    "4 bags" (05/21/2013)  . Colon cancer 2014    s/p resection and ileostomy  . 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 Leg reconstruction using fascial flap  ~ 2009  . Still born baby  03/22/13    delivered vaginally @ ~ 24 weeks  . Appendectomy  2008  . Colonoscopy N/A 05/24/2013    Procedure: COLONOSCOPY;  Surgeon: Missy Sabins, MD;  Location: [place];  Service: Endoscopy;  Laterality: N/A;  . Colon surgery    . Ileostomy  06/26/2013  . I&d extremity Left 11/14/2013    Procedure: IRRIGATION AND DEBRIDEMENT LEFT LONG FINGER;  Surgeon: Tennis Must, MD;  Location: [facility];  Service: Orthopedics;  Laterality: Left;   Social History:  reports that she has never smoked. She  has never used smokeless tobacco. She reports that she does not drink alcohol or use illicit drugs. Worker's comp.  Previously worked at [employer]  . Other     [employer]    Family History  Problem Relation Age of Onset  . Adopted: Yes  . Alcohol abuse Neg Hx   . Arthritis Neg Hx   . Asthma Neg Hx   . Birth defects Neg Hx   . Cancer Neg Hx   . COPD Neg Hx   . Depression Neg Hx   . Diabetes Neg Hx   . Drug abuse Neg Hx   . Early death Neg Hx   . Hearing loss Neg Hx   . Heart disease Neg Hx   . Hyperlipidemia Neg Hx   . Hypertension Neg Hx   . Kidney disease Neg Hx   .  Learning disabilities Neg Hx   . Mental illness Neg Hx   . Mental retardation Neg Hx   . Miscarriages / Stillbirths Neg Hx   . Stroke Neg Hx   . Vision loss Neg Hx   . ALS Mother      Prior to Admission medications   Medication Sig Start Date End Date Taking? Authorizing Provider  doxycycline (VIBRA-TABS) 100 MG tablet Take 1 tablet (100 mg total) by mouth 2 (two) times daily. 11/18/13  Yes Carlyle Basques, MD  loperamide (IMODIUM) 1 MG/5ML solution Take 3 mg by mouth 2 (two) times daily.   Yes Historical Provider, MD  promethazine (PHENERGAN) 25 MG tablet Take 1 tablet (25 mg total) by mouth every 6 (six) hours as needed for nausea or vomiting. 11/18/13  Yes Carlyle Basques, MD  EPINEPHrine (EPIPEN) 0.3 mg/0.3 mL SOAJ injection Inject 0.3 mLs (0.3 mg total) into the muscle as needed. 08/10/13   Joanell Rising, MD   Physical Exam: Filed Vitals:   12/25/13 1359 12/25/13 1440 12/25/13 1618  BP: 90/57  105/64  Pulse: 145  118  Temp: 97.8 F (36.6 C)    TempSrc: Oral    Resp: 16  20  Height:  5\' 2"  (1.575 m)   Weight:  43.999 kg (97 lb)   SpO2: 97%  98%     General:  Ill-appearing caucasian female with sunken eyes, perioral pallor, NAD  Eyes:  PERRL, anicteric, non-injected.    ENT:  Nares clear.  OP clear, non-erythematous without plaques or exudates.  Dry lips  Neck:  Supple without TM or JVD.    Lymph:  No cervical, supraclavicular, or submandibular LAD.  Cardiovascular:  Tachycardic RR, normal S1, S2, without m/r/g.  2+ pulses, warm extremities  Respiratory:  CTA bilaterally without increased WOB.  Abdomen:  Hyperactive BS.  Soft, ND/NT.  ileostomy with watery green liquid  Skin:  No rashes or focal lesions.  Musculoskeletal:  Normal bulk and tone.  No LE edema.  Psychiatric:  A & O x 4.  Appropriate affect.  Neurologic:  CN 3-12 intact.  4/5 strength diffusely.  Sensation intact.  Labs on Admission:  Basic Metabolic Panel:  Recent Labs Lab 12/25/13 1435   NA 127*  K 3.8  CL 93*  CO2 12*  GLUCOSE 109*  BUN 38*  CREATININE 2.11*  CALCIUM 9.7   Liver Function Tests:  Recent Labs Lab 12/25/13 1435  AST 20  ALT 15  ALKPHOS 129*  BILITOT 0.4  PROT 9.9*  ALBUMIN 5.0   No results found for this basename: LIPASE, AMYLASE,  in the  last 168 hours No results found for this basename: AMMONIA,  in the last 168 hours CBC:  Recent Labs Lab 12/25/13 1435  WBC 13.5*  NEUTROABS 11.5*  HGB 15.4*  HCT 44.0  MCV 80.7  PLT 513*   Cardiac Enzymes:  Recent Labs Lab 12/25/13 1435  TROPONINI <0.30    BNP (last 3 results) No results found for this basename: PROBNP,  in the last 8760 hours CBG: No results found for this basename: GLUCAP,  in the last 168 hours  Radiological Exams on Admission: No results found.  EKG: Independently reviewed. Sinus tachycardia with some mild ST-segment elevations probably due to repolarization abnl from tachycardia.    Assessment/Plan Principal Problem:   Hypovolemic shock Active Problems:   Leukocytosis   Nausea with vomiting   Dehydration   AKI (acute kidney injury)   Hyponatremia   Thrombocytosis   Metabolic acidosis   Osteomyelitis  ---  Hypovolemic shock due to nausea, vomiting, and inability to concentrate stools because of ileostomy with tachycardia and hypotension and AKI.   -  NS bolus x 2 more liters -  NS at 148ml/h overnight -  Low threshold for blood cultures and empiric antibiotics  AKI, hyponatremia, and hypochloremia are likely due to dehydration, baseline creatinine 0.4. -  IVF as above -  Repeat BMP in AM -  Minimize nephrotoxins -  Renally dose medications ( lovenox) -  Trend potassium, magnesium, and phos as these are likely depleted   + gap metabolic acidosis is likely due to starvation ketosis, but most of acidosis is due to high output ileostomy.  Baseline CO2 is 14-15.  Lactic acid 1.49. -  IVF overnight -  Regular diet as tolerated  Nausea & vomiting,  normal LFTs, no abdominal pain to suggest pancreatitis -  F/u UA -  zofran scheduled -  Compazine prn  Diarrhea with abx exposure -  C. Diff -  Stool culture and GI pathogen panel -  Okay to increase imodium to 4mg  TID despite pending C. Diff -  Consider trying lomotil if imodium does not work  Leukocytosis, thrombocytosis, ane elevated hgb, may all be due to dehydration -  No resp symptoms to suggest PNA -  F/u UA -  F/u stool studies -  Repeat CBC in AM  Osteomyelitis, stable.  Continue doxycycline  Colon cancer in remission.    Diet:  regular Access:  PIV IVF:  yes Proph:  Lovenox, renally dosed  Code Status: full Family Communication: patient alone Disposition Plan: Admit to telemetry  Time spent: 60 min Janece Canterbury [facility] Pager [phone]  If 7PM-7AM, please contact night-coverage [email or web address] Password [email or web address] 12/25/2013, 5:30 PM

## 2013-12-25 NOTE — ED Notes (Signed)
Pt reports she was being seen at pcp for nausea and sob. Reports blood pressure was in the 60's and was told to come to ED. At present pt denies pain. Reports SOB and weakness.

## 2013-12-25 NOTE — ED Provider Notes (Signed)
CSN: [id number]     Arrival date & time 12/25/13  1352 History   First MD Initiated Contact with Patient 12/25/13 1456     Chief Complaint  Patient presents with  . Hypotension   (Consider location/radiation/quality/duration/timing/severity/associated sxs/prior Treatment) HPI Comments: Patient sent to the emergency department from her primary care doctor's office for hypotension. The patient reports that she has been sick for the last several days. She has had nausea without vomiting. She has not been here taking because of this. She has not experienced any abdominal pain. She reports that she has felt progressively worsening weakness and some shortness of breath and dizziness associated with the symptoms. When she was seen by her doctor this morning, blood pressure was 60 systolic.  Patient has a complex past medical history including osteomyelitis of the left index finger, currently on long-term antibiotic coverage. She reports no change in the finger, has not had any swelling, redness or fever. Patient has ileostomy secondary to colon cancer. She has not noticed any significant increase in output.   Past Medical History  Diagnosis Date  . UTI (lower urinary tract infection)   . Heart murmur   . Pneumonia 2008; 2012  . Complication of anesthesia     "I swallowed the anesthesia when appendix taken out; got pneumonia"  . Iron deficiency anemia   . History of blood transfusion 04/26/2013    "4 bags" (05/21/2013)  . Cancer 2014    Colon   Past Surgical History  Procedure Laterality Date  . Leg reconstruction using fascial flap  ~ 2009  . Still born baby  03/22/13    delivered vaginally @ ~ 24 weeks  . Appendectomy  2008  . Colonoscopy N/A 05/24/2013    Procedure: COLONOSCOPY;  Surgeon: Missy Sabins, MD;  Location: [facility];  Service: Endoscopy;  Laterality: N/A;  . Colon surgery    . Ileostomy  06/26/2013  . I&d extremity Left 11/14/2013    Procedure: IRRIGATION AND DEBRIDEMENT LEFT  LONG FINGER;  Surgeon: Tennis Must, MD;  Location: [facility];  Service: Orthopedics;  Laterality: Left;   Family History  Problem Relation Age of Onset  . Adopted: Yes  . Alcohol abuse Neg Hx   . Arthritis Neg Hx   . Asthma Neg Hx   . Birth defects Neg Hx   . Cancer Neg Hx   . COPD Neg Hx   . Depression Neg Hx   . Diabetes Neg Hx   . Drug abuse Neg Hx   . Early death Neg Hx   . Hearing loss Neg Hx   . Heart disease Neg Hx   . Hyperlipidemia Neg Hx   . Hypertension Neg Hx   . Kidney disease Neg Hx   . Learning disabilities Neg Hx   . Mental illness Neg Hx   . Mental retardation Neg Hx   . Miscarriages / Stillbirths Neg Hx   . Stroke Neg Hx   . Vision loss Neg Hx   . ALS Mother    History  Substance Use Topics  . Smoking status: Never Smoker   . Smokeless tobacco: Never Used  . Alcohol Use: No   OB History   Grav Para Term Preterm Abortions TAB SAB Ect Mult Living   1 1  1            Review of Systems  Constitutional: Negative for fever.  Gastrointestinal: Positive for nausea. Negative for vomiting and abdominal pain.  Neurological: Positive  for dizziness and weakness.  All other systems reviewed and are negative.    Allergies  Review of patient's allergies indicates no known allergies.  Home Medications   Current Outpatient Rx  Name  Route  Sig  Dispense  Refill  . doxycycline (VIBRA-TABS) 100 MG tablet   Oral   Take 1 tablet (100 mg total) by mouth 2 (two) times daily.   60 tablet   1   . oxyCODONE-acetaminophen (PERCOCET) 5-325 MG per tablet      1-2 tabs po q6 hours prn pain   30 tablet   0   . promethazine (PHENERGAN) 25 MG tablet   Oral   Take 1 tablet (25 mg total) by mouth every 6 (six) hours as needed for nausea or vomiting.   30 tablet   1   . EPINEPHrine (EPIPEN) 0.3 mg/0.3 mL SOAJ injection   Intramuscular   Inject 0.3 mLs (0.3 mg total) into the muscle as needed.   2 Device   0    BP 90/57  Pulse 145   Temp(Src) 97.8 F (36.6 C) (Oral)  Resp 16  Ht 5\' 2"  (1.575 m)  Wt 97 lb (43.999 kg)  BMI 17.74 kg/m2  SpO2 97% Physical Exam  Constitutional: She is oriented to person, place, and time. She appears well-developed and well-nourished. No distress.  HENT:  Head: Normocephalic and atraumatic.  Right Ear: Hearing normal.  Left Ear: Hearing normal.  Nose: Nose normal.  Mouth/Throat: Oropharynx is clear and moist and mucous membranes are normal.  Eyes: Conjunctivae and EOM are normal. Pupils are equal, round, and reactive to light.  Neck: Normal range of motion. Neck supple.  Cardiovascular: Regular rhythm, S1 normal and S2 normal.  Exam reveals no gallop and no friction rub.   No murmur heard. Pulmonary/Chest: Effort normal and breath sounds normal. No respiratory distress. She exhibits no tenderness.  Abdominal: Soft. Normal appearance and bowel sounds are normal. There is no hepatosplenomegaly. There is no tenderness. There is no rebound, no guarding, no tenderness at McBurney's point and negative Murphy's sign. No hernia.  Musculoskeletal: Normal range of motion.  Neurological: She is alert and oriented to person, place, and time. She has normal strength. No cranial nerve deficit or sensory deficit. Coordination normal. GCS eye subscore is 4. GCS verbal subscore is 5. GCS motor subscore is 6.  Skin: Skin is warm, dry and intact. No rash noted. No cyanosis.  Psychiatric: She has a normal mood and affect. Her speech is normal and behavior is normal. Thought content normal.    ED Course  Procedures (including critical care time) Labs Review Labs Reviewed  CBC WITH DIFFERENTIAL - Abnormal; Notable for the following:    WBC 13.5 (*)    RBC 5.45 (*)    Hemoglobin 15.4 (*)    Platelets 513 (*)    Neutrophils Relative % 86 (*)    Neutro Abs 11.5 (*)    Lymphocytes Relative 10 (*)    All other components within normal limits  COMPREHENSIVE METABOLIC PANEL - Abnormal; Notable for the  following:    Sodium 127 (*)    Chloride 93 (*)    CO2 12 (*)    Glucose, Bld 109 (*)    BUN 38 (*)    Creatinine, Ser 2.11 (*)    Total Protein 9.9 (*)    Alkaline Phosphatase 129 (*)    GFR calc non Af Amer 32 (*)    GFR calc Af Amer 37 (*)  All other components within normal limits  CLOSTRIDIUM DIFFICILE BY PCR  TROPONIN I  URINALYSIS, ROUTINE W REFLEX MICROSCOPIC  CG4 I-STAT (LACTIC ACID)  TYPE AND SCREEN   Imaging Review No results found.  EKG Interpretation    Date/Time:  Thursday December 25 2013 14:47:24 EST Ventricular Rate:  98 PR Interval:  141 QRS Duration: 77 QT Interval:  333 QTC Calculation: 425 R Axis:   84 Text Interpretation:  Sinus rhythm Right atrial enlargement ST elev, probable normal early repol pattern No significant change since last tracing Confirmed by Anayah Arvanitis  MD, [place] ([id number]) on 12/25/2013 3:01:22 PM            MDM  Diagnosis: 1. Dehydration 2. Hypertension 3. Acute kidney injury  Patient presents to the ER for evaluation of profound dehydration and hypotension. The patient has had nausea without vomiting. She has had poor intake because of this. Reviewing the patient's records reveals that she has had similar presentations in the past which were secondary to high output ostomy.  Patient has had some improvement with IV fluids. There is no sign of acute infection or sepsis. Because of her chronic antibiotic use for osteomyelitis, however, a C. difficile antigen has been sent. Patient's abdominal exam is benign and nontender. Lab work reveals mild leukocytosis of unclear etiology. She has mild hyponatremia with elevated BUN and creatinine of 38 and 2.11.  Patient will require admission for IV fluids due to her profound dehydration.    Orpah Greek, MD 12/25/13 [id number]

## 2013-12-25 NOTE — Progress Notes (Signed)
Utilization Review completed.  Brooklynn Brandenburg RN CM  

## 2013-12-26 DIAGNOSIS — E871 Hypo-osmolality and hyponatremia: Secondary | ICD-10-CM

## 2013-12-26 DIAGNOSIS — E876 Hypokalemia: Secondary | ICD-10-CM

## 2013-12-26 LAB — BASIC METABOLIC PANEL
BUN: 29 mg/dL — ABNORMAL HIGH (ref 6–23)
CO2: 13 mEq/L — ABNORMAL LOW (ref 19–32)
Calcium: 8.3 mg/dL — ABNORMAL LOW (ref 8.4–10.5)
Chloride: 102 mEq/L (ref 96–112)
Creatinine, Ser: 1.4 mg/dL — ABNORMAL HIGH (ref 0.50–1.10)
GFR calc Af Amer: 60 mL/min — ABNORMAL LOW (ref >90)
GFR calc non Af Amer: 52 mL/min — ABNORMAL LOW (ref >90)
GLUCOSE: 96 mg/dL (ref 70–99)
Potassium: 3.3 mEq/L — ABNORMAL LOW (ref 3.7–5.3)
Sodium: 132 mEq/L — ABNORMAL LOW (ref 137–147)

## 2013-12-26 LAB — CBC
HCT: 34.6 % — ABNORMAL LOW (ref 36.0–46.0)
Hemoglobin: 11.7 g/dL — ABNORMAL LOW (ref 12.0–15.0)
MCH: 27.5 pg (ref 26.0–34.0)
MCHC: 33.8 g/dL (ref 30.0–36.0)
MCV: 81.4 fL (ref 78.0–100.0)
Platelets: 376 10*3/uL (ref 150–400)
RBC: 4.25 MIL/uL (ref 3.87–5.11)
RDW: 15.1 % (ref 11.5–15.5)
WBC: 7.3 10*3/uL (ref 4.0–10.5)

## 2013-12-26 LAB — MAGNESIUM: Magnesium: 2 mg/dL (ref 1.5–2.5)

## 2013-12-26 LAB — URINE CULTURE: Colony Count: 40000

## 2013-12-26 LAB — IRON AND TIBC
IRON: 104 ug/dL (ref 42–135)
Saturation Ratios: 30 % (ref 20–55)
TIBC: 351 ug/dL (ref 250–470)
UIBC: 247 ug/dL (ref 125–400)

## 2013-12-26 LAB — PHOSPHORUS: PHOSPHORUS: 4.6 mg/dL (ref 2.3–4.6)

## 2013-12-26 LAB — FERRITIN: Ferritin: 24 ng/mL (ref 10–291)

## 2013-12-26 LAB — VITAMIN B12: VITAMIN B 12: 224 pg/mL (ref 211–911)

## 2013-12-26 LAB — TRANSFERRIN: Transferrin: 275 mg/dL (ref 200–360)

## 2013-12-26 MED ORDER — KCL IN DEXTROSE-NACL 20-5-0.45 MEQ/L-%-% IV SOLN
INTRAVENOUS | Status: DC
  Administered 2013-12-26 – 2013-12-27 (×3): via INTRAVENOUS
  Filled 2013-12-26 (×5): qty 1000

## 2013-12-26 MED ORDER — ONDANSETRON HCL 4 MG/2ML IJ SOLN: 4.0000 mg | Freq: Four times a day (QID) | INTRAMUSCULAR | Status: DC | PRN

## 2013-12-26 MED ORDER — ENSURE COMPLETE PO LIQD
237.0000 mL | Freq: Two times a day (BID) | ORAL | Status: DC
  Administered 2013-12-26 – 2013-12-27 (×2): 237 mL via ORAL

## 2013-12-26 NOTE — Progress Notes (Signed)
[facility] PROGRESS NOTE  Cindy [name] [id number] DOB: [birth year] DOA: 12/25/2013 PCP: Vidal Schwalbe, MD  Assessment/Plan  Hypovolemic shock due to nausea, vomiting, and inability to concentrate stools because of ileostomy with tachycardia and hypotension and AKI, resolving with IVF. - Decrease IVF rate today -  No signs of sepsis so far  AKI, hyponatremia, and hypochloremia are likely due to dehydration, baseline creatinine 0.4.  Resolving - IVF as above  - Repeat BMP in AM  - Minimize nephrotoxins  - Renally dose medications ( lovenox)  - Trend potassium, magnesium, and phos as these are likely depleted   + gap metabolic acidosis is likely due to starvation ketosis, but most of acidosis is due to high output ileostomy.  Resolving. Baseline CO2 is 14-15. Lactic acid 1.49.  - Continue IVF  - Regular diet as tolerated   Nausea & vomiting, normal LFTs, no abdominal pain to suggest pancreatitis, resolving.   - F/u UA: trace LE, 3-6 WBC, 11-20 RBC, many bacteria and hyaline casts -  F/u urine culture - zofran scheduled  - Compazine prn   Diarrhea, likely due to ileostomy but rule out infection - C. Diff neg - Stool culture and GI pathogen panel pending - Continue increased imodium and adjust dose as needed  Leukocytosis, thrombocytosis, and elevated hgb, due to dehydration and resolving with IVF - No resp symptoms to suggest PNA  - F/u UCx  - F/u stool studies   Normocytic anemia, at risk for b12 and iron deficiency -  Iron studies, b12, folate  Osteomyelitis, stable. Continue doxycycline   Colon cancer in remission.   Hypokalemia, likely due to diarrhea and severe protein-calorie malnutrition -  IV supplementation -  Nutrition consultation -  Start supplements  Diet: regular  Access: PIV  IVF: yes  Proph: Lovenox, renally dosed  Code Status: full  Family Communication: patient alone  Disposition Plan: Admit to  telemetry  Consultants:  none  Procedures:  none  Antibiotics:  none   HPI/Subjective:  States she is feeling much better.  Fatigue and nausea improving.      Objective: Filed Vitals:   12/25/13 1800 12/25/13 2240 12/26/13 0258 12/26/13 0455  BP: 106/60 96/50 98/50  95/49  Pulse: 105 92  80  Temp: 97.7 F (36.5 C) 98.1 F (36.7 C)  98 F (36.7 C)  TempSrc: Oral Oral  Oral  Resp: 20 20  18   Height: 5\' 2"  (1.575 m)     Weight: 43.999 kg (97 lb)     SpO2: 100% 100%  100%    Intake/Output Summary (Last 24 hours) at 12/26/13 1130 Last data filed at 12/26/13 1014  Gross per 24 hour  Intake      0 ml  Output   1575 ml  Net  -1575 ml   Filed Weights   12/25/13 1440 12/25/13 1800  Weight: 43.999 kg (97 lb) 43.999 kg (97 lb)    Exam:   General:  Thin F, No acute distress, eyes less sunken  HEENT:  NCAT, MMM  Cardiovascular:  RRR, nl S1, S2 no mrg, 2+ pulses, warm extremities  Respiratory:  CTAB, no increased WOB  Abdomen:   NABS, soft, NT/ND, liquid stool in ostomy bag  MSK:   Normal tone and bulk, no LEE  Neuro:  Grossly intact  Data Reviewed: Basic Metabolic Panel:  Recent Labs Lab 12/25/13 1435 12/25/13 1727 12/26/13 0333  NA 127*  --  132*  K 3.8  --  3.3*  CL 93*  --  102  CO2 12*  --  13*  GLUCOSE 109*  --  96  BUN 38*  --  29*  CREATININE 2.11*  --  1.40*  CALCIUM 9.7  --  8.3*  MG  --  2.5 2.0  PHOS  --  7.2* 4.6   Liver Function Tests:  Recent Labs Lab 12/25/13 1435  AST 20  ALT 15  ALKPHOS 129*  BILITOT 0.4  PROT 9.9*  ALBUMIN 5.0   No results found for this basename: LIPASE, AMYLASE,  in the last 168 hours No results found for this basename: AMMONIA,  in the last 168 hours CBC:  Recent Labs Lab 12/25/13 1435 12/26/13 0333  WBC 13.5* 7.3  NEUTROABS 11.5*  --   HGB 15.4* 11.7*  HCT 44.0 34.6*  MCV 80.7 81.4  PLT 513* 376   Cardiac Enzymes:  Recent Labs Lab 12/25/13 1435  TROPONINI <0.30   BNP (last 3  results) No results found for this basename: PROBNP,  in the last 8760 hours CBG: No results found for this basename: GLUCAP,  in the last 168 hours  Recent Results (from the past 240 hour(s))  CLOSTRIDIUM DIFFICILE BY PCR     Status: None   Collection Time    12/25/13  3:16 PM      Result Value Range Status   C difficile by pcr NEGATIVE  NEGATIVE Final   Comment: Performed at [facility]  MRSA PCR SCREENING     Status: None   Collection Time    12/25/13  8:37 PM      Result Value Range Status   MRSA by PCR NEGATIVE  NEGATIVE Final   Comment:            The GeneXpert MRSA Assay (FDA     approved for NASAL specimens     only), is one component of a     comprehensive MRSA colonization     surveillance program. It is not     intended to diagnose MRSA     infection nor to guide or     monitor treatment for     MRSA infections.     Studies: No results found.  Scheduled Meds: . doxycycline  100 mg Oral BID  . enoxaparin (LOVENOX) injection  30 mg Subcutaneous Q24H  . loperamide  4 mg Oral TID  . ondansetron (ZOFRAN) IV  4 mg Intravenous Q6H  . sodium chloride  3 mL Intravenous Q12H  . zolpidem  5 mg Oral QHS   Continuous Infusions: . dextrose 5 % and 0.45 % NaCl with KCl 20 mEq/L 100 mL/hr at 12/26/13 [id number]    Principal Problem:   Hypovolemic shock Active Problems:   Leukocytosis   Nausea with vomiting   Dehydration   AKI (acute kidney injury)   Hyponatremia   Thrombocytosis   Metabolic acidosis   Osteomyelitis    Time spent: 30 min    Kush Farabee, [facility] Pager [phone]. If 7PM-7AM, please contact night-coverage at [email or web address], password [facility] 12/26/2013, 11:30 AM  LOS: 1 day

## 2013-12-26 NOTE — Progress Notes (Signed)
INITIAL NUTRITION ASSESSMENT  DOCUMENTATION CODES Per approved criteria  -Underweight   INTERVENTION: - Ensure Complete BID - Educated pt on high calorie/protein diet which was discussed with pt as well as diet therapy for high ileostomy output and provided handouts of this information - Will continue to monitor   NUTRITION DIAGNOSIS: Increased nutrient needs related to underweight as evidenced by BMI of 17 kg/(m^2).   Goal: Pt to consume >90% of meals/supplements   Monitor:  Weights, labs, intake, ostomy output  Reason for Assessment: Consult   23 y.o. female  Admitting Dx: Hypovolemic shock  ASSESSMENT: Pt with history of colon cancer s/p colectomy with ileostomy 05/2013, iron deficiency anemia, and left third finger osteomyelitis who presents with a two day history of nausea, vomiting and inability to tolerate food or beverage. The patient was last at their baseline health 3-4 days ago. She normally has watery stools and empties her ostomy bag 5-6 times per day. The frequency and consistency of her stools has not changed. She denies blood in her stools and abdominal pain. For the last two days, however, she has had nausea and vomiting, NBNB, about 3 times per day and inability to eat or drink. She denies fevers, URI symptoms, cough, dysuria.  She presented to her PCP office yesterday and she was feeling lightheaded and fatigued.   Met with pt who reports eating nothing but half of a chick-fil-a sandwich for the past 2 days due to nausea. Reports then she was eating all time, mostly foods like steak, chicken, sandwiches, and chips. Tried to drink 12 cups of fluid per day, would drink water, juices, and soda. Reports weighing 125 pounds in July of last year however weight trend shows pt's weight was 92 pounds in July. Denies any nausea today. Pt with 364m total ileostomy output yesterday.   Potassium low, getting replacement in IVF Alk phos elevated BUN/Cr elevated with low  GFR  Nutrition Focused Physical Exam:  Subcutaneous Fat:  Orbital Region: WNL Upper Arm Region: WNL Thoracic and Lumbar Region: WNL  Muscle:  Temple Region: WNL Clavicle Bone Region: WNL Clavicle and Acromion Bone Region: WNL Scapular Bone Region: NA Dorsal Hand: WNL Patellar Region: WNL Anterior Thigh Region: WNL Posterior Calf Region: WNL  Edema: None noted    Height: Ht Readings from Last 1 Encounters:  12/25/13 5' 2"  (1.575 m)    Weight: Wt Readings from Last 1 Encounters:  12/25/13 97 lb (43.999 kg)    Ideal Body Weight: 110 lb   % Ideal Body Weight: 88%  Wt Readings from Last 10 Encounters:  12/25/13 97 lb (43.999 kg)  11/18/13 99 lb (44.906 kg)  11/14/13 96 lb (43.545 kg)  11/14/13 96 lb (43.545 kg)  09/23/13 93 lb 4.1 oz (42.3 kg)  06/13/13 92 lb (41.731 kg)  06/02/13 92 lb 9.6 oz (42.003 kg)  05/20/13 106 lb (48.081 kg)  05/20/13 106 lb (48.081 kg)  05/20/13 106 lb (48.081 kg)    Usual Body Weight: 125 lb in July 2014 per pt  % Usual Body Weight: 78%  BMI:  Body mass index is 17.74 kg/(m^2). Underweight  Estimated Nutritional Needs: Kcal: 1350-1550 Protein: 55-70g Fluid: 1.3-1.5L/day   Skin: Intact  Diet Order: General  EDUCATION NEEDS: -Education needs addressed - discussed diet therapy for high ileostomy output, provided handouts with RD contact information    Intake/Output Summary (Last 24 hours) at 12/26/13 1421 Last data filed at 12/26/13 1218  Gross per 24 hour  Intake  0 ml  Output   1775 ml  Net  -1775 ml    Last BM: 1/30, watery brown from ileostomy   Labs:   Recent Labs Lab 12/25/13 1435 12/25/13 1727 12/26/13 0333  NA 127*  --  132*  K 3.8  --  3.3*  CL 93*  --  102  CO2 12*  --  13*  BUN 38*  --  29*  CREATININE 2.11*  --  1.40*  CALCIUM 9.7  --  8.3*  MG  --  2.5 2.0  PHOS  --  7.2* 4.6  GLUCOSE 109*  --  96    CBG (last 3)  No results found for this basename: GLUCAP,  in the last 72  hours  Scheduled Meds: . doxycycline  100 mg Oral BID  . enoxaparin (LOVENOX) injection  30 mg Subcutaneous Q24H  . feeding supplement (ENSURE COMPLETE)  237 mL Oral BID BM  . loperamide  4 mg Oral TID  . sodium chloride  3 mL Intravenous Q12H  . zolpidem  5 mg Oral QHS    Continuous Infusions: . dextrose 5 % and 0.45 % NaCl with KCl 20 mEq/L 100 mL/hr at 12/26/13 8588    Past Medical History  Diagnosis Date  . UTI (lower urinary tract infection)   . Heart murmur   . Pneumonia 2008; 2012  . Complication of anesthesia     "I swallowed the anesthesia when appendix taken out; got pneumonia"  . Iron deficiency anemia   . History of blood transfusion 04/26/2013    "4 bags" (05/21/2013)  . Colon cancer 2014    s/p resection and ileostomy  . 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 Leg reconstruction using fascial flap  ~ 2009  . Still born baby  03/22/13    delivered vaginally @ ~ 24 weeks  . Appendectomy  2008  . Colonoscopy N/A 05/24/2013    Procedure: COLONOSCOPY;  Surgeon: Missy Sabins, MD;  Location: [facility];  Service: Endoscopy;  Laterality: N/A;  . Colon surgery    . Ileostomy  06/26/2013  . I&d extremity Left 11/14/2013    Procedure: IRRIGATION AND DEBRIDEMENT LEFT LONG FINGER;  Surgeon: Tennis Must, MD;  Location: [facility];  Service: Orthopedics;  Laterality: Left;    Mikey College MS, [place], [place] Pager [phone] After Hours Pager

## 2013-12-26 NOTE — Care Management Note (Signed)
    Page 1 of 1   12/26/2013     1:22:14 PM   CARE MANAGEMENT NOTE 12/26/2013  Patient:  Cindy [name], Cindy [name]   Account Number:  [id number]  Date Initiated:  12/26/2013  Documentation initiated by:  [facility]  Subjective/Objective Assessment:   23 Y/O F ADMITTED W/NAUSEA/VOMITING.ZA:[id number] CA,OSTEOMYELITIS,ILEOSTOMY.     Action/Plan:   FROM HOME.HAS PCP,PHARMACY.   Anticipated DC Date:  12/30/2013   Anticipated DC Plan:  [facility]  CM consult      Choice offered to / List presented to:             Status of service:  In process, will continue to follow Medicare Important Message given?   (If response is "NO", the following Medicare IM given date fields will be blank) Date Medicare IM given:   Date Additional Medicare IM given:    Discharge Disposition:    Per UR Regulation:  Reviewed for med. necessity/level of care/duration of stay  If discussed at [place] of Stay Meetings, dates discussed:    Comments:  12/26/13 Jerell Demery RN,BSN NCM [phone] UTI,ELEVATED BUN,& HR.C DIFF NEG.

## 2013-12-27 DIAGNOSIS — D509 Iron deficiency anemia, unspecified: Secondary | ICD-10-CM

## 2013-12-27 LAB — CBC
HCT: 30.4 % — ABNORMAL LOW (ref 36.0–46.0)
Hemoglobin: 9.8 g/dL — ABNORMAL LOW (ref 12.0–15.0)
MCH: 26.9 pg (ref 26.0–34.0)
MCHC: 32.2 g/dL (ref 30.0–36.0)
MCV: 83.5 fL (ref 78.0–100.0)
Platelets: 273 10*3/uL (ref 150–400)
RBC: 3.64 MIL/uL — ABNORMAL LOW (ref 3.87–5.11)
RDW: 15.5 % (ref 11.5–15.5)
WBC: 5.5 10*3/uL (ref 4.0–10.5)

## 2013-12-27 LAB — BASIC METABOLIC PANEL
BUN: 16 mg/dL (ref 6–23)
CO2: 17 mEq/L — ABNORMAL LOW (ref 19–32)
Calcium: 8.2 mg/dL — ABNORMAL LOW (ref 8.4–10.5)
Chloride: 108 mEq/L (ref 96–112)
Creatinine, Ser: 1.03 mg/dL (ref 0.50–1.10)
GFR, EST AFRICAN AMERICAN: 87 mL/min — AB (ref >90)
GFR, EST NON AFRICAN AMERICAN: 75 mL/min — AB (ref >90)
Glucose, Bld: 100 mg/dL — ABNORMAL HIGH (ref 70–99)
Potassium: 3.9 mEq/L (ref 3.7–5.3)
SODIUM: 135 meq/L — AB (ref 137–147)

## 2013-12-27 LAB — MAGNESIUM: Magnesium: 1.6 mg/dL (ref 1.5–2.5)

## 2013-12-27 LAB — PHOSPHORUS: PHOSPHORUS: 3.3 mg/dL (ref 2.3–4.6)

## 2013-12-27 MED ORDER — FERROUS SULFATE 325 (65 FE) MG PO TABS
325.0000 mg | ORAL_TABLET | Freq: Every day | ORAL | Status: DC
  Filled 2013-12-27 (×2): qty 1

## 2013-12-27 MED ORDER — VITAMIN B-12 1000 MCG PO TABS
1000.0000 ug | ORAL_TABLET | Freq: Every day | ORAL | Status: DC
  Filled 2013-12-27 (×2): qty 1

## 2013-12-27 MED ORDER — MAGNESIUM SULFATE 40 MG/ML IJ SOLN
2.0000 g | Freq: Once | INTRAMUSCULAR | Status: AC
  Administered 2013-12-27: 2 g via INTRAVENOUS
  Filled 2013-12-27: qty 50

## 2013-12-27 MED ORDER — FERUMOXYTOL INJECTION 510 MG/17 ML
510.0000 mg | Freq: Once | INTRAVENOUS | Status: AC
  Administered 2013-12-27: 510 mg via INTRAVENOUS
  Filled 2013-12-27: qty 17

## 2013-12-27 MED ORDER — CYANOCOBALAMIN 1000 MCG PO TABS: 1000.0000 ug | ORAL_TABLET | Freq: Every day | ORAL | Status: DC

## 2013-12-27 MED ORDER — LOPERAMIDE HCL 1 MG/5ML PO LIQD: 4.0000 mg | Freq: Three times a day (TID) | ORAL | Status: DC

## 2013-12-27 NOTE — Discharge Summary (Addendum)
Physician Discharge Summary  Cindy [name] [id number] DOB: 1991-02-04 DOA: 12/25/2013  PCP: Vidal Schwalbe, MD  Admit date: 12/25/2013 Discharge date: 12/27/2013  Recommendations for Outpatient Follow-up:  1. Follow up with primary care doctor within 1 week of discharge for repeat BMP and to discuss ongoing treatment of iron deficiency anemia.  Stool culture and GI pathogen panel pending.  Folate level pending.  She may need injection vitamin B12 if the oral tabs are ineffective due to her colectomy.  2. Advised to increase her imodium as needed.    Discharge Diagnoses:  Principal Problem:   Hypovolemic shock Active Problems:   Leukocytosis   Nausea with vomiting   Dehydration   AKI (acute kidney injury)   Hyponatremia   Thrombocytosis   Metabolic acidosis   Osteomyelitis   Anemia, iron deficiency   Vitamin B12 deficiency anemia   Discharge Condition: stable, improved  Diet recommendation: regular  Wt Readings from Last 3 Encounters:  12/25/13 43.999 kg (97 lb)  11/18/13 44.906 kg (99 lb)  11/14/13 43.545 kg (96 lb)    History of present illness:  The patient is a 23 y.o. year-old female with history of colon cancer s/p colectomy with ileostomy 05/2013, iron deficiency anemia, and left third finger osteomyelitis who presents with a two day history of nausea, vomiting and inability to tolerate food or beverage. The patient was last at their baseline health 3-4 days ago. She normally has watery stools and empties her ostomy bag 5-6 times per day. The frequency and consistency of her stools has not changed. She denies blood in her stools and abdominal pain. For the last two days, however, she has had nausea and vomiting, NBNB, about 3 times per day and inability to eat or drink. She denies fevers, URI symptoms, cough, dysuria. She is taking doxycycline for left third finger osteomyelitis and has been on antibiotics for > 5 weeks. She presented to her PCP office today and she was  feeling lightheaded and fatigued. Her SBP was 60. She was brought to the ER where initial BP was 90/57, HR 145, sinus tachycardia.  In ER, labs were notable for WBC 13.5, hgb 15.4, plt 513, Na 127, CO2 12, BUN 38, creatinine 2.11. UA is pending. She has been started on IVF and after 1.5 liters, her BP is now 110s and HR 110s. She is being admitted for severe dehydration, AKI, and electrolyte disturbance.    Hospital Course:   Hypovolemic shock due to nausea, vomiting, and inability to concentrate stools because of ileostomy with tachycardia and hypotension and AKI, resolved with IVF.  She did not exhibit evidence of sepsis.    AKI, hyponatremia, and hypochloremia were likely due to dehydration, baseline creatinine 0.4.  All three improved with IVF.  Her creatinine trended down to 1 by the day of discharge and now that she is eating and drinking well, she should be able to stay hydrated and have recovery of her kidney function over the next week.  Her primary care doctor may repeat her blood work in 1 week to verify resolution.    Hypomagnesemia, likely secondary to diarrhea and dehydration.  She was given IV repletion.    + gap metabolic acidosis is likely due to starvation ketosis, but most of acidosis is due to high output ileostomy.  Lactic acid 1.49.  With IVF her bicarb trended up to 16.  She is encouraged to drink at least 1.5L of fluid every day and more if possible.    Nausea &  vomiting, normal LFTs, no abdominal pain to suggest pancreatitis.  Her lipase was mildly elevated, but this was probably due to vomiting and dehydration.  UA: trace LE, 3-6 WBC, 11-20 RBC, many bacteria and hyaline casts  Urine culture grew 40K colonies of mixed flora.  She was given zofran and compazine and her nausea resolved and she was able to tolerate a regular diet.    Diarrhea, likely due to ileostomy but rule out infection.  C. Diff neg.  Stool culture and GI pathogen panel pending.  Increased imodium so that  her stools because slightly less frequent.  Warned against dangers of constipation with ileostomy and advised her to adjust her dose of imodium so that she has 3 soft stools per day.    Leukocytosis, thrombocytosis, and elevated hgb, due to dehydration and resolved with IVF.  No resp symptoms to suggest PNA.  UCx grew mixed flora.  Stool studies pending.    Normocytic anemia due to iron deficiency and b12 deficiency.  She was started on vitamin B12 1073mcg daily and given a dose of IV iron while in the hospital.  She states that she cannot take oral iron because it causes constipation.  Advised her to talk to her primary care doctor about ongoing treatment options.  Folate pending.  She may need injection vitamin B12 if the oral tabs are ineffective due to her colectomy.   Osteomyelitis, stable. Continue doxycycline per ID.   Colon cancer in remission.   Hypokalemia, likely due to diarrhea and severe protein-calorie malnutrition, resolved with IV supplementation.    Severe protein calorie malnutrition, encouraged regular diet with supplements.   Consultants:  none Procedures:  none Antibiotics:  none    Discharge Exam: Filed Vitals:   12/27/13 0635  BP: 90/40  Pulse: 74  Temp: 98.4 F (36.9 C)  Resp: 18   Filed Vitals:   12/26/13 0455 12/26/13 1359 12/26/13 2323 12/27/13 0635  BP: 95/49 102/47 100/58 90/40  Pulse: 80 58 97 74  Temp: 98 F (36.7 C) 98 F (36.7 C) 98.1 F (36.7 C) 98.4 F (36.9 C)  TempSrc: Oral Oral Oral Oral  Resp: 18 20  18   Height:      Weight:      SpO2: 100% 100% 100% 100%    General: Thin F, No acute distress HEENT: NCAT, MMM  Cardiovascular: RRR, nl S1, S2 no mrg, 2+ pulses, warm extremities  Respiratory: CTAB, no increased WOB  Abdomen: NABS, soft, NT/ND, liquid stool in ostomy bag  MSK: Normal tone and bulk, no LEE  Neuro: Grossly intact   Discharge Instructions      Discharge Orders   Future Appointments Provider Department Dept  Phone   01/06/2014 9:45 AM Carlyle Basques, MD [facility] for Infectious Disease [phone]   Future Orders Complete By Expires   Call MD for:  difficulty breathing, headache or visual disturbances  As directed    Call MD for:  extreme fatigue  As directed    Call MD for:  hives  As directed    Call MD for:  persistant dizziness or light-headedness  As directed    Call MD for:  persistant nausea and vomiting  As directed    Call MD for:  severe uncontrolled pain  As directed    Call MD for:  temperature >100.4  As directed    Diet general  As directed    Discharge instructions  As directed    Comments:  You were hospitalized with dehydration.  Please drink at least 1.5 to 2L of fluid every day.  Consider increasing your imodium to 4mg  three times a day to slightly decrease the frequency of your bowel movements to decrease the risk of dehydration.  You are iron and vitamin B12 deficiency.  You received a dose of IV iron and have been given a prescription for oral vitamin B12 supplementation to take daily at home.  You will need to have your levels repeated by your primary care doctor in a month and you will need to discuss ongoing treatment for iron deficiency.  Please have your doctor repeat your bloodwork in 1-2 weeks to make sure your kidneys have fully recovered.  You are severely malnourished and underweight.  Please try to eat more and drink at least two supplements a day.  You can try ensure or similar supplements or carnation instant breakfast, whatever tastes good enough to take at least twice a day for extra calories.  Please return to the hospital if you feel you are getting dehydrated again.   Increase activity slowly  As directed        Medication List         cyanocobalamin 1000 MCG tablet  Take 1 tablet (1,000 mcg total) by mouth daily.     doxycycline 100 MG tablet  Commonly known as:  VIBRA-TABS  Take 1 tablet (100 mg total) by mouth 2 (two) times daily.      EPINEPHrine 0.3 mg/0.3 mL Soaj injection  Commonly known as:  EPIPEN  Inject 0.3 mLs (0.3 mg total) into the muscle as needed.     loperamide 1 MG/5ML solution  Commonly known as:  IMODIUM  Take 20 mLs (4 mg total) by mouth 3 (three) times daily.     promethazine 25 MG tablet  Commonly known as:  PHENERGAN  Take 1 tablet (25 mg total) by mouth every 6 (six) hours as needed for nausea or vomiting.       Follow-up Information   Follow up with Vidal Schwalbe, MD. Schedule an appointment as soon as possible for a visit in 1 week.   Specialty:  Family Medicine   Contact information:   [address], [place] [address] [phone]        The results of significant diagnostics from this hospitalization (including imaging, microbiology, ancillary and laboratory) are listed below for reference.    Significant Diagnostic Studies: No results found.  Microbiology: Recent Results (from the past 240 hour(s))  CLOSTRIDIUM DIFFICILE BY PCR     Status: None   Collection Time    12/25/13  3:16 PM      Result Value Range Status   C difficile by pcr NEGATIVE  NEGATIVE Final   Comment: Performed at [facility]     Status: None   Collection Time    12/25/13  4:19 PM      Result Value Range Status   Specimen Description URINE, CLEAN CATCH   Final   Special Requests NONE   Final   Culture  Setup Time     Final   Value: 12/25/2013 22:34     Performed at [facility]     Final   Value: 40,000 COLONIES/ML     Performed at [facility]   Culture     Final   Value: Multiple bacterial morphotypes present, none predominant. Suggest appropriate recollection if clinically indicated.  Performed at [facility]   Report Status 12/26/2013 FINAL   Final  MRSA PCR SCREENING     Status: None   Collection Time    12/25/13  8:37 PM      Result Value Range Status   MRSA by PCR NEGATIVE  NEGATIVE Final    Comment:            The GeneXpert MRSA Assay (FDA     approved for NASAL specimens     only), is one component of a     comprehensive MRSA colonization     surveillance program. It is not     intended to diagnose MRSA     infection nor to guide or     monitor treatment for     MRSA infections.     Labs: Basic Metabolic Panel:  Recent Labs Lab 12/25/13 1435 12/25/13 1727 12/26/13 0333 12/27/13 0505  NA 127*  --  132* 135*  K 3.8  --  3.3* 3.9  CL 93*  --  102 108  CO2 12*  --  13* 17*  GLUCOSE 109*  --  96 100*  BUN 38*  --  29* 16  CREATININE 2.11*  --  1.40* 1.03  CALCIUM 9.7  --  8.3* 8.2*  MG  --  2.5 2.0 1.6  PHOS  --  7.2* 4.6 3.3   Liver Function Tests:  Recent Labs Lab 12/25/13 1435  AST 20  ALT 15  ALKPHOS 129*  BILITOT 0.4  PROT 9.9*  ALBUMIN 5.0   No results found for this basename: LIPASE, AMYLASE,  in the last 168 hours No results found for this basename: AMMONIA,  in the last 168 hours CBC:  Recent Labs Lab 12/25/13 1435 12/26/13 0333 12/27/13 0505  WBC 13.5* 7.3 5.5  NEUTROABS 11.5*  --   --   HGB 15.4* 11.7* 9.8*  HCT 44.0 34.6* 30.4*  MCV 80.7 81.4 83.5  PLT 513* 376 273   Cardiac Enzymes:  Recent Labs Lab 12/25/13 1435  TROPONINI <0.30   BNP: BNP (last 3 results) No results found for this basename: PROBNP,  in the last 8760 hours CBG: No results found for this basename: GLUCAP,  in the last 168 hours  Time coordinating discharge: 45 minutes  Signed:  Joyceann Kruser  [facility] 12/27/2013, 11:27 AM

## 2013-12-28 LAB — AFB CULTURE WITH SMEAR (NOT AT ARMC): Acid Fast Smear: NONE SEEN

## 2013-12-28 LAB — FOLATE RBC: RBC FOLATE: 595 ng/mL (ref >280)

## 2014-01-01 ENCOUNTER — Ambulatory Visit: Payer: Self-pay | Admitting: Internal Medicine

## 2014-01-06 ENCOUNTER — Ambulatory Visit: Payer: Self-pay | Admitting: Internal Medicine

## 2014-01-19 ENCOUNTER — Ambulatory Visit (INDEPENDENT_AMBULATORY_CARE_PROVIDER_SITE_OTHER): Payer: Worker's Compensation | Admitting: Internal Medicine

## 2014-01-19 ENCOUNTER — Encounter: Payer: Self-pay | Admitting: Internal Medicine

## 2014-01-19 VITALS — BP 97/65 | HR 77 | Temp 97.7°F | Wt 107.0 lb

## 2014-01-19 DIAGNOSIS — M869 Osteomyelitis, unspecified: Secondary | ICD-10-CM

## 2014-01-19 LAB — SEDIMENTATION RATE: Sed Rate: 6 mm/hr (ref 0–22)
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
## 2014-01-28 ENCOUNTER — Emergency Department (HOSPITAL_COMMUNITY)
Admission: EM | Admit: 2014-01-28 | Discharge: 2014-01-28 | Disposition: A | Discharge status: Home / Self Care | Payer: No Typology Code available for payment source | Attending: Emergency Medicine | Admitting: Emergency Medicine

## 2014-01-28 ENCOUNTER — Encounter (HOSPITAL_COMMUNITY): Payer: Self-pay | Admitting: Emergency Medicine

## 2014-01-28 DIAGNOSIS — Z87440 Personal history of urinary (tract) infections: Secondary | ICD-10-CM | POA: Insufficient documentation

## 2014-01-28 DIAGNOSIS — Z85038 Personal history of other malignant neoplasm of large intestine: Secondary | ICD-10-CM | POA: Insufficient documentation

## 2014-01-28 DIAGNOSIS — Z3202 Encounter for pregnancy test, result negative: Secondary | ICD-10-CM | POA: Insufficient documentation

## 2014-01-28 DIAGNOSIS — Z8739 Personal history of other diseases of the musculoskeletal system and connective tissue: Secondary | ICD-10-CM | POA: Insufficient documentation

## 2014-01-28 DIAGNOSIS — R109 Unspecified abdominal pain: Secondary | ICD-10-CM | POA: Insufficient documentation

## 2014-01-28 DIAGNOSIS — R300 Dysuria: Secondary | ICD-10-CM | POA: Insufficient documentation

## 2014-01-28 DIAGNOSIS — Z9089 Acquired absence of other organs: Secondary | ICD-10-CM | POA: Insufficient documentation

## 2014-01-28 DIAGNOSIS — R011 Cardiac murmur, unspecified: Secondary | ICD-10-CM | POA: Insufficient documentation

## 2014-01-28 DIAGNOSIS — Z79899 Other long term (current) drug therapy: Secondary | ICD-10-CM | POA: Insufficient documentation

## 2014-01-28 DIAGNOSIS — Z862 Personal history of diseases of the blood and blood-forming organs and certain disorders involving the immune mechanism: Secondary | ICD-10-CM | POA: Insufficient documentation

## 2014-01-28 DIAGNOSIS — Z8701 Personal history of pneumonia (recurrent): Secondary | ICD-10-CM | POA: Insufficient documentation

## 2014-01-28 LAB — CBC WITH DIFFERENTIAL/PLATELET
Basophils Absolute: 0 10*3/uL (ref 0.0–0.1)
Basophils Relative: 0 % (ref 0–1)
EOS ABS: 0.1 10*3/uL (ref 0.0–0.7)
EOS PCT: 1 % (ref 0–5)
HCT: 38.2 % (ref 36.0–46.0)
Hemoglobin: 13.1 g/dL (ref 12.0–15.0)
Lymphocytes Relative: 19 % (ref 12–46)
Lymphs Abs: 2.2 10*3/uL (ref 0.7–4.0)
MCH: 28.7 pg (ref 26.0–34.0)
MCHC: 34.3 g/dL (ref 30.0–36.0)
MCV: 83.8 fL (ref 78.0–100.0)
Monocytes Absolute: 0.9 10*3/uL (ref 0.1–1.0)
Monocytes Relative: 8 % (ref 3–12)
NEUTROS PCT: 72 % (ref 43–77)
Neutro Abs: 8.1 10*3/uL — ABNORMAL HIGH (ref 1.7–7.7)
PLATELETS: 313 10*3/uL (ref 150–400)
RBC: 4.56 MIL/uL (ref 3.87–5.11)
RDW: 15.1 % (ref 11.5–15.5)
WBC: 11.3 10*3/uL — ABNORMAL HIGH (ref 4.0–10.5)

## 2014-01-28 LAB — URINE MICROSCOPIC-ADD ON

## 2014-01-28 LAB — BASIC METABOLIC PANEL
BUN: 27 mg/dL — ABNORMAL HIGH (ref 6–23)
CALCIUM: 9 mg/dL (ref 8.4–10.5)
CO2: 16 mEq/L — ABNORMAL LOW (ref 19–32)
Chloride: 99 mEq/L (ref 96–112)
Creatinine, Ser: 1.2 mg/dL — ABNORMAL HIGH (ref 0.50–1.10)
GFR calc Af Amer: 73 mL/min — ABNORMAL LOW (ref >90)
GFR calc non Af Amer: 63 mL/min — ABNORMAL LOW (ref >90)
GLUCOSE: 105 mg/dL — AB (ref 70–99)
Potassium: 4.1 mEq/L (ref 3.7–5.3)
SODIUM: 131 meq/L — AB (ref 137–147)

## 2014-01-28 LAB — URINALYSIS, ROUTINE W REFLEX MICROSCOPIC
Bilirubin Urine: NEGATIVE
Glucose, UA: NEGATIVE mg/dL
Ketones, ur: NEGATIVE mg/dL
Leukocytes, UA: NEGATIVE
Nitrite: NEGATIVE
PROTEIN: NEGATIVE mg/dL
Specific Gravity, Urine: 1.012 (ref 1.005–1.030)
Urobilinogen, UA: 0.2 mg/dL (ref 0.0–1.0)
pH: 6 (ref 5.0–8.0)

## 2014-01-28 LAB — POC URINE PREG, ED: Preg Test, Ur: NEGATIVE

## 2014-01-28 MED ORDER — ONDANSETRON HCL 4 MG/2ML IJ SOLN
4.0000 mg | Freq: Once | INTRAMUSCULAR | Status: AC
  Administered 2014-01-28: 4 mg via INTRAVENOUS
  Filled 2014-01-28: qty 2

## 2014-01-28 MED ORDER — HYDROMORPHONE HCL PF 1 MG/ML IJ SOLN
1.0000 mg | Freq: Once | INTRAMUSCULAR | Status: AC
  Administered 2014-01-28: 1 mg via INTRAMUSCULAR

## 2014-01-28 MED ORDER — HYDROMORPHONE HCL PF 1 MG/ML IJ SOLN
1.0000 mg | Freq: Once | INTRAMUSCULAR | Status: DC
  Filled 2014-01-28: qty 1

## 2014-01-28 MED ORDER — HYDROMORPHONE HCL PF 1 MG/ML IJ SOLN
1.0000 mg | Freq: Once | INTRAMUSCULAR | Status: AC
  Administered 2014-01-28: 1 mg via INTRAVENOUS
  Filled 2014-01-28: qty 1

## 2014-01-28 MED ORDER — ONDANSETRON HCL 4 MG PO TABS: 4.0000 mg | ORAL_TABLET | Freq: Four times a day (QID) | ORAL | Status: DC

## 2014-01-28 MED ORDER — SODIUM CHLORIDE 0.9 % IV BOLUS (SEPSIS)
1000.0000 mL | Freq: Once | INTRAVENOUS | Status: AC
  Administered 2014-01-28: 1000 mL via INTRAVENOUS

## 2014-01-28 MED ORDER — HYDROCODONE-ACETAMINOPHEN 5-325 MG PO TABS: 2.0000 | ORAL_TABLET | Freq: Four times a day (QID) | ORAL | Status: DC | PRN

## 2014-01-28 NOTE — ED Notes (Signed)
Per pt report: Pt c/o of left flank pain that began yesterday morning and has become progressively worse.  Pt reports it feels like a "really bad urinary tract infection."  Pt reports pain increases after voiding. Pt endorses nausea but denies vomiting, SOB, fever or chills.  Pt a/o x 4 and ambulatory.  Skin warm and dry.

## 2014-01-28 NOTE — Discharge Instructions (Signed)
Flank Pain Flank pain refers to pain that is located on the side of the body between the upper abdomen and the back. The pain may occur over a short period of time (acute) or may be long-term or reoccurring (chronic). It may be mild or severe. Flank pain can be caused by many things. CAUSES  Some of the more common causes of flank pain include:  Muscle strains.   Muscle spasms.   A disease of your spine (vertebral disk disease).   A lung infection (pneumonia).   Fluid around your lungs (pulmonary edema).   A kidney infection.   Kidney stones.   A very painful skin rash caused by the chickenpox virus (shingles).   Gallbladder disease.  [place] care will depend on the cause of your pain. In general,  Rest as directed by your caregiver.  Drink enough fluids to keep your urine clear or pale yellow.  Only take over-the-counter or prescription medicines as directed by your caregiver. Some medicines may help relieve the pain.  Tell your caregiver about any changes in your pain.  Follow up with your caregiver as directed. SEEK IMMEDIATE MEDICAL CARE IF:   Your pain is not controlled with medicine.   You have new or worsening symptoms.  Your pain increases.   You have abdominal pain.   You have shortness of breath.   You have persistent nausea or vomiting.   You have swelling in your abdomen.   You feel faint or pass out.   You have blood in your urine.  You have a fever or persistent symptoms for more than 2 3 days.  You have a fever and your symptoms suddenly get worse. MAKE SURE YOU:   Understand these instructions.  Will watch your condition.  Will get help right away if you are not doing well or get worse. Document Released: 01/04/2006 Document Revised: 08/07/2012 Document Reviewed: 06/27/2012 [facility] Patient Information 2014 [place]. Kidney Stones Kidney stones (urolithiasis) are deposits that form inside  your kidneys. The intense pain is caused by the stone moving through the urinary tract. When the stone moves, the ureter goes into spasm around the stone. The stone is usually passed in the urine.  CAUSES   A disorder that makes certain neck glands produce too much parathyroid hormone (primary hyperparathyroidism).  A buildup of uric acid crystals, similar to gout in your joints.  Narrowing (stricture) of the ureter.  A kidney obstruction present at birth (congenital obstruction).  Previous surgery on the kidney or ureters.  Numerous kidney infections. SYMPTOMS   Feeling sick to your stomach (nauseous).  Throwing up (vomiting).  Blood in the urine (hematuria).  Pain that usually spreads (radiates) to the groin.  Frequency or urgency of urination. DIAGNOSIS   Taking a history and physical exam.  Blood or urine tests.  CT scan.  Occasionally, an examination of the inside of the urinary bladder (cystoscopy) is performed. TREATMENT   Observation.  Increasing your fluid intake.  Extracorporeal shock wave lithotripsy This is a noninvasive procedure that uses shock waves to break up kidney stones.  Surgery may be needed if you have severe pain or persistent obstruction. There are various surgical procedures. Most of the procedures are performed with the use of small instruments. Only small incisions are needed to accommodate these instruments, so recovery time is minimized. The size, location, and chemical composition are all important variables that will determine the proper choice of action for you. Talk to your  health care provider to better understand your situation so that you will minimize the risk of injury to yourself and your kidney.  HOME CARE INSTRUCTIONS   Drink enough water and fluids to keep your urine clear or pale yellow. This will help you to pass the stone or stone fragments.  Strain all urine through the provided strainer. Keep all particulate matter and  stones for your health care provider to see. The stone causing the pain may be as small as a grain of salt. It is very important to use the strainer each and every time you pass your urine. The collection of your stone will allow your health care provider to analyze it and verify that a stone has actually passed. The stone analysis will often identify what you can do to reduce the incidence of recurrences.  Only take over-the-counter or prescription medicines for pain, discomfort, or fever as directed by your health care provider.  Make a follow-up appointment with your health care provider as directed.  Get follow-up X-rays if required. The absence of pain does not always mean that the stone has passed. It may have only stopped moving. If the urine remains completely obstructed, it can cause loss of kidney function or even complete destruction of the kidney. It is your responsibility to make sure X-rays and follow-ups are completed. Ultrasounds of the kidney can show blockages and the status of the kidney. Ultrasounds are not associated with any radiation and can be performed easily in a matter of minutes. SEEK MEDICAL CARE IF:  You experience pain that is progressive and unresponsive to any pain medicine you have been prescribed. SEEK IMMEDIATE MEDICAL CARE IF:   Pain cannot be controlled with the prescribed medicine.  You have a fever or shaking chills.  The severity or intensity of pain increases over 18 hours and is not relieved by pain medicine.  You develop a new onset of abdominal pain.  You feel faint or pass out.  You are unable to urinate. MAKE SURE YOU:   Understand these instructions.  Will watch your condition.  Will get help right away if you are not doing well or get worse. Document Released: 11/13/2005 Document Revised: 07/16/2013 Document Reviewed: 04/16/2013 [facility] Patient Information 2014 [place].

## 2014-01-28 NOTE — ED Notes (Signed)
POC urine preg result not crossing over in system. [place], [place] informed of neg result and updated on pt status.

## 2014-01-28 NOTE — ED Notes (Signed)
Pt waiting in ED until 15 minutes after IM injection.

## 2014-01-28 NOTE — ED Provider Notes (Signed)
CSN: [id number]     Arrival date & time 01/28/14  [id number] History   First MD Initiated Contact with Patient 01/28/14 [phone]     Chief Complaint  Patient presents with  . Flank Pain     (Consider location/radiation/quality/duration/timing/severity/associated sxs/prior Treatment) HPI Comments: Patient with history of kidney stones, presents emergency department with chief complaints of dysuria, and flank pain that began yesterday. She states that it is progressively worsened. She complains of 10 out of 10 pain. She denies any vomiting, but has felt nauseated. She denies any chest pain, shortness of breath, fevers, or chills. She has not tried taking anything to alleviate her symptoms. Of note, patient has a complex past medical history remarkable for recent colectomy with colostomy placement. She states that she does not have any complaints regarding her recent surgeries. She has close followup. She states only thing that is bothering her is the dysuria and flank pain.  The history is provided by the patient. No language interpreter was used.    Past Medical History  Diagnosis Date  . UTI (lower urinary tract infection)   . Heart murmur   . Pneumonia 2008; 2012  . Complication of anesthesia     "I swallowed the anesthesia when appendix taken out; got pneumonia"  . Iron deficiency anemia   . History of blood transfusion 04/26/2013    "4 bags" (05/21/2013)  . Colon cancer 2014    s/p resection and ileostomy  . 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 Leg reconstruction using fascial flap  ~ 2009  . Still born baby  [birth year]/13    delivered vaginally @ ~ 24 weeks  . Appendectomy  2008  . Colonoscopy N/A 05/24/2013    Procedure: COLONOSCOPY;  Surgeon: Missy Sabins, MD;  Location: [place];  Service: Endoscopy;  Laterality: N/A;  . Colon surgery    . Ileostomy  06/26/2013  . I&d extremity Left 11/14/2013    Procedure:  IRRIGATION AND DEBRIDEMENT LEFT LONG FINGER;  Surgeon: Tennis Must, MD;  Location: [place];  Service: Orthopedics;  Laterality: Left;   Family History  Problem Relation Age of Onset  . Adopted: Yes  . Alcohol abuse Neg Hx   . Arthritis Neg Hx   . Asthma Neg Hx   . Birth defects Neg Hx   . Cancer Neg Hx   . COPD Neg Hx   . Depression Neg Hx   . Diabetes Neg Hx   . Drug abuse Neg Hx   . Early death Neg Hx   . Hearing loss Neg Hx   . Heart disease Neg Hx   . Hyperlipidemia Neg Hx   . Hypertension Neg Hx   . Kidney disease Neg Hx   . Learning disabilities Neg Hx   . Mental illness Neg Hx   . Mental retardation Neg Hx   . Miscarriages / Stillbirths Neg Hx   . Stroke Neg Hx   . Vision loss Neg Hx   . ALS Mother    History  Substance Use Topics  . Smoking status: Never Smoker   . Smokeless tobacco: Never Used  . Alcohol Use: No   OB History   Grav Para Term Preterm Abortions TAB SAB Ect Mult Living   1 1  1            Review of Systems  All other systems reviewed and are  negative.      Allergies  Other  Home Medications   Current Outpatient Rx  Name  Route  Sig  Dispense  Refill  . EPINEPHrine (EPIPEN) 0.3 mg/0.3 mL SOAJ injection   Intramuscular   Inject 0.3 mLs (0.3 mg total) into the muscle as needed.   2 Device   0   . loperamide (IMODIUM) 1 MG/5ML solution   Oral   Take 20 mLs (4 mg total) by mouth 3 (three) times daily.   120 mL   0   . promethazine (PHENERGAN) 25 MG tablet   Oral   Take 1 tablet (25 mg total) by mouth every 6 (six) hours as needed for nausea or vomiting.   30 tablet   1   . vitamin B-12 1000 MCG tablet   Oral   Take 1 tablet (1,000 mcg total) by mouth daily.   32 tablet   0    BP 101/63  Pulse 87  Temp(Src) 97.6 F (36.4 C) (Oral)  Resp 16  Ht 5\' 1"  (1.549 m)  Wt 107 lb (48.535 kg)  BMI 20.23 kg/m2  SpO2 100%  LMP 01/25/2014 Physical Exam  Nursing note and vitals reviewed. Constitutional: She  is oriented to person, place, and time. She appears well-developed and well-nourished.  HENT:  Head: Normocephalic and atraumatic.  Eyes: Conjunctivae and EOM are normal. Pupils are equal, round, and reactive to light.  Neck: Normal range of motion. Neck supple.  Cardiovascular: Normal rate and regular rhythm.  Exam reveals no gallop and no friction rub.   No murmur heard. Pulmonary/Chest: Effort normal and breath sounds normal. No respiratory distress. She has no wheezes. She has no rales. She exhibits no tenderness.  Abdominal: Soft. Bowel sounds are normal. She exhibits no distension and no mass. There is no tenderness. There is no rebound and no guarding.  CVA tenderness, some tenderness over the urinary bladder, no other focal abdominal tenderness, no RLQ tenderness or pain at McBurney's point, no RUQ tenderness or Murphy's sign, no left-sided abdominal tenderness, no fluid wave, or signs of peritonitis   Musculoskeletal: Normal range of motion. She exhibits no edema and no tenderness.  Neurological: She is alert and oriented to person, place, and time.  Skin: Skin is warm and dry.  Psychiatric: She has a normal mood and affect. Her behavior is normal. Judgment and thought content normal.    ED Course  Procedures (including critical care time) Results for orders placed during the hospital encounter of 01/28/14  CBC WITH DIFFERENTIAL      Result Value Ref Range   WBC 11.3 (*) 4.0 - 10.5 K/uL   RBC 4.56  3.87 - 5.11 MIL/uL   Hemoglobin 13.1  12.0 - 15.0 g/dL   HCT 38.2  36.0 - 46.0 %   MCV 83.8  78.0 - 100.0 fL   MCH 28.7  26.0 - 34.0 pg   MCHC 34.3  30.0 - 36.0 g/dL   RDW 15.1  11.5 - 15.5 %   Platelets 313  150 - 400 K/uL   Neutrophils Relative % 72  43 - 77 %   Neutro Abs 8.1 (*) 1.7 - 7.7 K/uL   Lymphocytes Relative 19  12 - 46 %   Lymphs Abs 2.2  0.7 - 4.0 K/uL   Monocytes Relative 8  3 - 12 %   Monocytes Absolute 0.9  0.1 - 1.0 K/uL   Eosinophils Relative 1  0 - 5 %    Eosinophils Absolute  0.1  0.0 - 0.7 K/uL   Basophils Relative 0  0 - 1 %   Basophils Absolute 0.0  0.0 - 0.1 K/uL  BASIC METABOLIC PANEL      Result Value Ref Range   Sodium 131 (*) 137 - 147 mEq/L   Potassium 4.1  3.7 - 5.3 mEq/L   Chloride 99  96 - 112 mEq/L   CO2 16 (*) 19 - 32 mEq/L   Glucose, Bld 105 (*) 70 - 99 mg/dL   BUN 27 (*) 6 - 23 mg/dL   Creatinine, Ser 1.20 (*) 0.50 - 1.10 mg/dL   Calcium 9.0  8.4 - 10.5 mg/dL   GFR calc non Af Amer 63 (*) >90 mL/min   GFR calc Af Amer 73 (*) >90 mL/min  URINALYSIS, ROUTINE W REFLEX MICROSCOPIC      Result Value Ref Range   Color, Urine YELLOW  YELLOW   APPearance CLEAR  CLEAR   Specific Gravity, Urine 1.012  1.005 - 1.030   pH 6.0  5.0 - 8.0   Glucose, UA NEGATIVE  NEGATIVE mg/dL   Hgb urine dipstick LARGE (*) NEGATIVE   Bilirubin Urine NEGATIVE  NEGATIVE   Ketones, ur NEGATIVE  NEGATIVE mg/dL   Protein, ur NEGATIVE  NEGATIVE mg/dL   Urobilinogen, UA 0.2  0.0 - 1.0 mg/dL   Nitrite NEGATIVE  NEGATIVE   Leukocytes, UA NEGATIVE  NEGATIVE  URINE MICROSCOPIC-ADD ON      Result Value Ref Range   Squamous Epithelial / LPF RARE  RARE   RBC / HPF 21-50  <3 RBC/hpf   Casts GRANULAR CAST (*) NEGATIVE  POC URINE PREG, ED      Result Value Ref Range   Preg Test, Ur NEGATIVE  NEGATIVE   No results found.    EKG Interpretation None      MDM   Final diagnoses:  Flank pain    Patient with flank pain.  Hx of kidney stones.  No fever, or chills, or vomiting.  9:47 AM Patient's pain is now 4/10. States that she is feeling little better.  Labs are near patient's baseline.  I suspect that she is dehydrated based on the chemistry panel.  Patient states that she is feeling much better.  I have discussed the patient with Dr. Maryan Rued, who agrees that the patient can be discharged to home.  She has not findings of acute abdomen.  Nothing to suggest obstruction or complication from prior surgery.  Patient has agreed to follow-up as  needed.  Return precautions have been given.  Patient is stable and read for discharge.    Montine Circle, PA-C 01/28/14 1029

## 2014-01-29 NOTE — ED Provider Notes (Signed)
Medical screening examination/treatment/procedure(s) were performed by non-physician practitioner and as supervising physician I was immediately available for consultation/collaboration.   EKG Interpretation None        Blanchie Dessert, MD 01/29/14 [phone]

## 2014-02-11 ENCOUNTER — Emergency Department (HOSPITAL_COMMUNITY)
Admission: EM | Admit: 2014-02-11 | Discharge: 2014-02-12 | Disposition: A | Discharge status: Home / Self Care | Payer: No Typology Code available for payment source | Attending: Emergency Medicine | Admitting: Emergency Medicine

## 2014-02-11 ENCOUNTER — Encounter (HOSPITAL_COMMUNITY): Payer: Self-pay | Admitting: Emergency Medicine

## 2014-02-11 DIAGNOSIS — Z87440 Personal history of urinary (tract) infections: Secondary | ICD-10-CM | POA: Insufficient documentation

## 2014-02-11 DIAGNOSIS — Z862 Personal history of diseases of the blood and blood-forming organs and certain disorders involving the immune mechanism: Secondary | ICD-10-CM | POA: Insufficient documentation

## 2014-02-11 DIAGNOSIS — A499 Bacterial infection, unspecified: Secondary | ICD-10-CM | POA: Insufficient documentation

## 2014-02-11 DIAGNOSIS — Z8739 Personal history of other diseases of the musculoskeletal system and connective tissue: Secondary | ICD-10-CM | POA: Insufficient documentation

## 2014-02-11 DIAGNOSIS — Z85038 Personal history of other malignant neoplasm of large intestine: Secondary | ICD-10-CM | POA: Insufficient documentation

## 2014-02-11 DIAGNOSIS — R109 Unspecified abdominal pain: Secondary | ICD-10-CM

## 2014-02-11 DIAGNOSIS — Z3202 Encounter for pregnancy test, result negative: Secondary | ICD-10-CM | POA: Insufficient documentation

## 2014-02-11 DIAGNOSIS — Z8701 Personal history of pneumonia (recurrent): Secondary | ICD-10-CM | POA: Insufficient documentation

## 2014-02-11 DIAGNOSIS — B9689 Other specified bacterial agents as the cause of diseases classified elsewhere: Secondary | ICD-10-CM | POA: Insufficient documentation

## 2014-02-11 DIAGNOSIS — R112 Nausea with vomiting, unspecified: Secondary | ICD-10-CM | POA: Insufficient documentation

## 2014-02-11 DIAGNOSIS — Z79899 Other long term (current) drug therapy: Secondary | ICD-10-CM | POA: Insufficient documentation

## 2014-02-11 DIAGNOSIS — N760 Acute vaginitis: Secondary | ICD-10-CM | POA: Insufficient documentation

## 2014-02-11 DIAGNOSIS — R011 Cardiac murmur, unspecified: Secondary | ICD-10-CM | POA: Insufficient documentation

## 2014-02-11 LAB — URINE MICROSCOPIC-ADD ON

## 2014-02-11 LAB — URINALYSIS, ROUTINE W REFLEX MICROSCOPIC
BILIRUBIN URINE: NEGATIVE
Glucose, UA: NEGATIVE mg/dL
Ketones, ur: NEGATIVE mg/dL
Leukocytes, UA: NEGATIVE
NITRITE: NEGATIVE
PROTEIN: 30 mg/dL — AB
Specific Gravity, Urine: 1.015 (ref 1.005–1.030)
UROBILINOGEN UA: 0.2 mg/dL (ref 0.0–1.0)
pH: 6 (ref 5.0–8.0)

## 2014-02-11 MED ORDER — ONDANSETRON HCL 4 MG/2ML IJ SOLN
4.0000 mg | Freq: Once | INTRAMUSCULAR | Status: AC
  Administered 2014-02-11: 4 mg via INTRAVENOUS
  Filled 2014-02-11: qty 2

## 2014-02-11 MED ORDER — MORPHINE SULFATE 4 MG/ML IJ SOLN
4.0000 mg | Freq: Once | INTRAMUSCULAR | Status: AC
  Administered 2014-02-11: 4 mg via INTRAVENOUS
  Filled 2014-02-11: qty 1

## 2014-02-11 NOTE — ED Notes (Signed)
Pt states she has had nausea and vomiting since 6am this morning  Pt denies pain or diarrhea  Pt states her last period was 12/28/13 and is late  States her periods are normally regular  Pt states she has not done a preg test

## 2014-02-11 NOTE — ED Provider Notes (Signed)
CSN: [id number]     Arrival date & time 02/11/14  2158 History  This chart was scribed for non-physician practitioner, Cindy Burke, PA-C working with Cindy Acosta, MD by Cindy [name], ED scribe. This patient was seen in room WTR2/WLPT2 and the patient's care was started at 11:13 PM.    Chief Complaint  Patient presents with  . Emesis   The history is provided by the patient. No language interpreter was used.   HPI Comments: Cindy [name] is a 23 y.o. female with history of FAP and colectomy who presents to the Emergency Department complaining of emesis that started at 6AM today. Pt states that her emesis is exarcerbated by eating. She is also complaining of associated abdominal pain. Pt describes the pain as stabbing. She reports eating a grilled chicken sandwich from [facility] this afternoon and [employer] at 6 am when her symptoms started.  She later tried to eat chicken wings. She has not taken any medication to improve her symptoms. Her last period was 12/28/13. She says that her periods are usually regular. She is also complaining of associated white vaginal discharge. [name] states that she was diagnosed with colon cancer so she had a colostomy bag placed. Her last appointment with her oncologist was February 12 who told her everything looked good. She reports being sexually active. Denies any fever, chills, or diaphoresis.   Past Medical History  Diagnosis Date  . UTI (lower urinary tract infection)   . Heart murmur   . Pneumonia 2008; 2012  . Complication of anesthesia     "I swallowed the anesthesia when appendix taken out; got pneumonia"  . Iron deficiency anemia   . History of blood transfusion 04/26/2013    "4 bags" (05/21/2013)  . Colon cancer 2014    s/p resection and ileostomy  . 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 Leg reconstruction using fascial flap  ~ 2009  . Still born baby   03/22/13    delivered vaginally @ ~ 24 weeks  . Appendectomy  2008  . Colonoscopy N/A 05/24/2013    Procedure: COLONOSCOPY;  Surgeon: Cindy Sabins, MD;  Location: [place];  Service: Endoscopy;  Laterality: N/A;  . Colon surgery    . Ileostomy  06/26/2013  . I&d extremity Left 11/14/2013    Procedure: IRRIGATION AND DEBRIDEMENT LEFT LONG FINGER;  Surgeon: Cindy Must, MD;  Location: [place];  Service: Orthopedics;  Laterality: Left;   Family History  Problem Relation Age of Onset  . Adopted: Yes  . Alcohol abuse Neg Hx   . Arthritis Neg Hx   . Asthma Neg Hx   . Birth defects Neg Hx   . Cancer Neg Hx   . COPD Neg Hx   . Depression Neg Hx   . Diabetes Neg Hx   . Drug abuse Neg Hx   . Early death Neg Hx   . Hearing loss Neg Hx   . Heart disease Neg Hx   . Hyperlipidemia Neg Hx   . Hypertension Neg Hx   . Kidney disease Neg Hx   . Learning disabilities Neg Hx   . Mental illness Neg Hx   . Mental retardation Neg Hx   . Miscarriages / Stillbirths Neg Hx   . Stroke Neg Hx   . Vision loss Neg Hx   . ALS Mother    History  Substance Use Topics  . Smoking status: Never Smoker   . Smokeless tobacco: Never Used  . Alcohol Use: No   OB History   Grav Para Term Preterm Abortions TAB SAB Ect Mult Living   1 1  1            Review of Systems  Constitutional: Negative for fever, chills and diaphoresis.  Gastrointestinal: Positive for nausea, vomiting and abdominal pain.  Genitourinary: Positive for vaginal discharge.  All other systems reviewed and are negative.       Allergies  Other  Home Medications   Current Outpatient Rx  Name  Route  Sig  Dispense  Refill  . EPINEPHrine (EPIPEN) 0.3 mg/0.3 mL SOAJ injection   Intramuscular   Inject 0.3 mLs (0.3 mg total) into the muscle as needed.   2 Device   0   . loperamide (IMODIUM) 1 MG/5ML solution   Oral   Take 20 mLs (4 mg total) by mouth 3 (three) times daily.   120 mL   0    BP 108/67   Pulse 99  Temp(Src) 97.9 F (36.6 C) (Oral)  Resp 18  SpO2 99%  LMP 12/28/2013  Physical Exam  Nursing note and vitals reviewed. Constitutional: She is oriented to person, place, and time. She appears well-developed and well-nourished.  Non-toxic appearance. She does not have a sickly appearance. She does not appear ill. No distress.  Well appearing, no distress noted  HENT:  Head: Normocephalic and atraumatic.  Right Ear: External ear normal.  Left Ear: External ear normal.  Nose: Nose normal.  Mouth/Throat: Uvula is midline, oropharynx is clear and moist and mucous membranes are normal.  Eyes: Conjunctivae are normal.  Neck: Normal range of motion.  No nuchal rigidity or meningeal signs  Cardiovascular: Normal rate, regular rhythm, normal heart sounds, intact distal pulses and normal pulses.   Pulmonary/Chest: Effort normal and breath sounds normal. No stridor. No respiratory distress. She has no wheezes. She has no rales.  Abdominal: Soft. Bowel sounds are normal. She exhibits no distension. There is generalized tenderness.  Colostomy bag in place. No surrounding erythema. Generalized abdominal tenderness without rebound  Genitourinary: No labial fusion. There is no rash, tenderness, lesion or injury on the right labia. There is no rash, tenderness, lesion or injury on the left labia. Uterus is tender. Uterus is not enlarged. Cervix exhibits no motion tenderness and no friability. Right adnexum displays no mass, no tenderness and no fullness. Left adnexum displays no mass, no tenderness and no fullness. No erythema, tenderness or bleeding around the vagina. No foreign body around the vagina. No signs of injury around the vagina. No vaginal discharge found.  Small amount of white discharge coming from cervical os. No cervical motion tenderness.   Musculoskeletal: Normal range of motion.  Neurological: She is alert and oriented to person, place, and time. She has normal strength.  Skin:  Skin is warm and dry. She is not diaphoretic. No erythema.  Psychiatric: Her behavior is normal.  Flat affect    ED Course  Procedures (including critical care time)  DIAGNOSTIC STUDIES: Oxygen Saturation is 99% on RA, normal by my interpretation.    COORDINATION OF CARE: 11:27 PM- Will order pregnancy test. Pt advised of plan for treatment and pt agrees.  Medications  ondansetron (ZOFRAN) injection 4 mg (4 mg Intravenous Given 02/11/14 2343)  morphine 4 MG/ML injection 4 mg (4 mg Intravenous Given 02/11/14 2342)  sodium chloride 0.9 % bolus 1,000 mL (0  mLs Intravenous Stopped 02/12/14 0419)  morphine 4 MG/ML injection 4 mg (4 mg Intravenous Given 02/12/14 0227)  ondansetron (ZOFRAN) injection 4 mg (4 mg Intravenous Given 02/12/14 0227)    Labs Review Labs Reviewed  WET PREP, GENITAL - Abnormal; Notable for the following:    Clue Cells Wet Prep HPF POC FEW (*)    WBC, Wet Prep HPF POC FEW (*)    All other components within normal limits  URINALYSIS, ROUTINE W REFLEX MICROSCOPIC - Abnormal; Notable for the following:    APPearance CLOUDY (*)    Hgb urine dipstick LARGE (*)    Protein, ur 30 (*)    All other components within normal limits  CBC WITH DIFFERENTIAL - Abnormal; Notable for the following:    WBC 11.5 (*)    All other components within normal limits  COMPREHENSIVE METABOLIC PANEL - Abnormal; Notable for the following:    Sodium 130 (*)    Potassium 3.5 (*)    CO2 15 (*)    Glucose, Bld 107 (*)    Creatinine, Ser 1.14 (*)    Alkaline Phosphatase 121 (*)    Total Bilirubin 0.2 (*)    GFR calc non Af Amer 67 (*)    GFR calc Af Amer 77 (*)    All other components within normal limits  LIPASE, BLOOD - Abnormal; Notable for the following:    Lipase 61 (*)    All other components within normal limits  URINE MICROSCOPIC-ADD ON - Abnormal; Notable for the following:    Squamous Epithelial / LPF MANY (*)    All other components within normal limits  URINE CULTURE   GC/CHLAMYDIA PROBE AMP  HIV ANTIBODY (ROUTINE TESTING)  POC URINE PREG, ED   Imaging Review Dg Abd Acute W/chest  02/12/2014   CLINICAL DATA:  Upper abdominal pain.  Nausea.  EXAM: ACUTE ABDOMEN SERIES (ABDOMEN 2 VIEW & CHEST 1 VIEW)  COMPARISON:  PA and lateral chest 05/21/2013. CT abdomen and pelvis 05/21/2013  FINDINGS: Single view of the chest demonstrates clear lungs and normal heart size. No pneumothorax or pleural fluid.  Two views of the abdomen show no free intraperitoneal air. The bowel gas pattern is normal. No abnormal abdominal calcification is seen.  IMPRESSION: Negative exam.   Electronically Signed   By: Inge Rise M.D.   On: 02/12/2014 03:27     EKG Interpretation None      MDM   Final diagnoses:  Abdominal pain  Nausea and vomiting  Bacterial vaginosis   Patient is nontoxic, nonseptic appearing, in no apparent distress.  Patient's pain and other symptoms adequately managed in emergency department.  Fluid bolus given.  Labs, imaging and vitals reviewed.  Patient does not meet the SIRS or Sepsis criteria.  On repeat exam patient does not have a surgical abdomen and there are no peritoneal signs.  Patient able to tolerate PO fluids and saltines. No indication of appendicitis, bowel obstruction, bowel perforation, cholecystitis, diverticulitis, PID or ectopic pregnancy.  Pt does have mild BV. Will treat with Flagyl. Discussed risks of drinking alcohol while on Flagyl. Patient discharged home with symptomatic treatment and given strict instructions for follow-up with their primary care physician.  I have also discussed reasons to return immediately to the ER.  Patient expresses understanding and agrees with plan.   I personally performed the services described in this documentation, which was scribed in my presence. The recorded information has been reviewed and is accurate.     Jarrett Soho  Baker Janus, PA-C 02/12/14 [phone]

## 2014-02-12 ENCOUNTER — Emergency Department (HOSPITAL_COMMUNITY): Payer: No Typology Code available for payment source

## 2014-02-12 LAB — COMPREHENSIVE METABOLIC PANEL
ALBUMIN: 4.2 g/dL (ref 3.5–5.2)
ALT: 11 U/L (ref 0–35)
AST: 15 U/L (ref 0–37)
Alkaline Phosphatase: 121 U/L — ABNORMAL HIGH (ref 39–117)
BUN: 22 mg/dL (ref 6–23)
CALCIUM: 9 mg/dL (ref 8.4–10.5)
CO2: 15 mEq/L — ABNORMAL LOW (ref 19–32)
CREATININE: 1.14 mg/dL — AB (ref 0.50–1.10)
Chloride: 98 mEq/L (ref 96–112)
GFR calc Af Amer: 77 mL/min — ABNORMAL LOW (ref >90)
GFR calc non Af Amer: 67 mL/min — ABNORMAL LOW (ref >90)
Glucose, Bld: 107 mg/dL — ABNORMAL HIGH (ref 70–99)
Potassium: 3.5 mEq/L — ABNORMAL LOW (ref 3.7–5.3)
Sodium: 130 mEq/L — ABNORMAL LOW (ref 137–147)
TOTAL PROTEIN: 8.2 g/dL (ref 6.0–8.3)
Total Bilirubin: 0.2 mg/dL — ABNORMAL LOW (ref 0.3–1.2)

## 2014-02-12 LAB — WET PREP, GENITAL
TRICH WET PREP: NONE SEEN
Yeast Wet Prep HPF POC: NONE SEEN

## 2014-02-12 LAB — CBC WITH DIFFERENTIAL/PLATELET
BASOS ABS: 0 10*3/uL (ref 0.0–0.1)
BASOS PCT: 0 % (ref 0–1)
EOS PCT: 1 % (ref 0–5)
Eosinophils Absolute: 0.1 10*3/uL (ref 0.0–0.7)
HCT: 42.6 % (ref 36.0–46.0)
Hemoglobin: 14.6 g/dL (ref 12.0–15.0)
Lymphocytes Relative: 32 % (ref 12–46)
Lymphs Abs: 3.7 10*3/uL (ref 0.7–4.0)
MCH: 28.8 pg (ref 26.0–34.0)
MCHC: 34.3 g/dL (ref 30.0–36.0)
MCV: 84 fL (ref 78.0–100.0)
Monocytes Absolute: 0.7 10*3/uL (ref 0.1–1.0)
Monocytes Relative: 6 % (ref 3–12)
Neutro Abs: 6.9 10*3/uL (ref 1.7–7.7)
Neutrophils Relative %: 60 % (ref 43–77)
Platelets: 370 10*3/uL (ref 150–400)
RBC: 5.07 MIL/uL (ref 3.87–5.11)
RDW: 14.3 % (ref 11.5–15.5)
WBC: 11.5 10*3/uL — ABNORMAL HIGH (ref 4.0–10.5)

## 2014-02-12 LAB — POC URINE PREG, ED: PREG TEST UR: NEGATIVE

## 2014-02-12 LAB — GC/CHLAMYDIA PROBE AMP
CT Probe RNA: NEGATIVE
GC Probe RNA: NEGATIVE

## 2014-02-12 LAB — HIV ANTIBODY (ROUTINE TESTING W REFLEX): HIV: NONREACTIVE

## 2014-02-12 LAB — LIPASE, BLOOD: Lipase: 61 U/L — ABNORMAL HIGH (ref 11–59)

## 2014-02-12 MED ORDER — ONDANSETRON HCL 4 MG PO TABS: 4.0000 mg | ORAL_TABLET | Freq: Four times a day (QID) | ORAL | Status: DC

## 2014-02-12 MED ORDER — METRONIDAZOLE 500 MG PO TABS: 500.0000 mg | ORAL_TABLET | Freq: Two times a day (BID) | ORAL | Status: DC

## 2014-02-12 MED ORDER — SODIUM CHLORIDE 0.9 % IV BOLUS (SEPSIS)
1000.0000 mL | Freq: Once | INTRAVENOUS | Status: AC
  Administered 2014-02-12: 1000 mL via INTRAVENOUS

## 2014-02-12 MED ORDER — ONDANSETRON HCL 4 MG/2ML IJ SOLN
4.0000 mg | Freq: Once | INTRAMUSCULAR | Status: AC
  Administered 2014-02-12: 4 mg via INTRAVENOUS
  Filled 2014-02-12: qty 2

## 2014-02-12 MED ORDER — MORPHINE SULFATE 4 MG/ML IJ SOLN
4.0000 mg | Freq: Once | INTRAMUSCULAR | Status: AC
  Administered 2014-02-12: 4 mg via INTRAVENOUS
  Filled 2014-02-12: qty 1

## 2014-02-12 NOTE — ED Notes (Signed)
Patient transported to X-ray 

## 2014-02-12 NOTE — Discharge Instructions (Signed)
Abdominal Pain, Women °Abdominal (stomach, pelvic, or belly) pain can be caused by many things. It is important to tell your doctor: °· The location of the pain. °· Does it come and go or is it present all the time? °· Are there things that start the pain (eating certain foods, exercise)? °· Are there other symptoms associated with the pain (fever, nausea, vomiting, diarrhea)? °All of this is helpful to know when trying to find the cause of the pain. °CAUSES  °· Stomach: virus or bacteria infection, or ulcer. °· Intestine: appendicitis (inflamed appendix), regional ileitis (Crohn's disease), ulcerative colitis (inflamed colon), irritable bowel syndrome, diverticulitis (inflamed diverticulum of the colon), or cancer of the stomach or intestine. °· Gallbladder disease or stones in the gallbladder. °· Kidney disease, kidney stones, or infection. °· Pancreas infection or cancer. °· Fibromyalgia (pain disorder). °· Diseases of the female organs: °· Uterus: fibroid (non-cancerous) tumors or infection. °· Fallopian tubes: infection or tubal pregnancy. °· Ovary: cysts or tumors. °· Pelvic adhesions (scar tissue). °· Endometriosis (uterus lining tissue growing in the pelvis and on the pelvic organs). °· Pelvic congestion syndrome (female organs filling up with blood just before the menstrual period). °· Pain with the menstrual period. °· Pain with ovulation (producing an egg). °· Pain with an IUD (intrauterine device, birth control) in the uterus. °· Cancer of the female organs. °· Functional pain (pain not caused by a disease, may improve without treatment). °· Psychological pain. °· Depression. °DIAGNOSIS  °Your doctor will decide the seriousness of your pain by doing an examination. °· Blood tests. °· X-rays. °· Ultrasound. °· CT scan (computed tomography, special type of X-ray). °· MRI (magnetic resonance imaging). °· Cultures, for infection. °· Barium enema (dye inserted in the large intestine, to better view it with  X-rays). °· Colonoscopy (looking in intestine with a lighted tube). °· Laparoscopy (minor surgery, looking in abdomen with a lighted tube). °· Major abdominal exploratory surgery (looking in abdomen with a large incision). °TREATMENT  °The treatment will depend on the cause of the pain.  °· Many cases can be observed and treated at home. °· Over-the-counter medicines recommended by your caregiver. °· Prescription medicine. °· Antibiotics, for infection. °· Birth control pills, for painful periods or for ovulation pain. °· Hormone treatment, for endometriosis. °· Nerve blocking injections. °· Physical therapy. °· Antidepressants. °· Counseling with a psychologist or psychiatrist. °· Minor or major surgery. °HOME CARE INSTRUCTIONS  °· Do not take laxatives, unless directed by your caregiver. °· Take over-the-counter pain medicine only if ordered by your caregiver. Do not take aspirin because it can cause an upset stomach or bleeding. °· Try a clear liquid diet (broth or water) as ordered by your caregiver. Slowly move to a bland diet, as tolerated, if the pain is related to the stomach or intestine. °· Have a thermometer and take your temperature several times a day, and record it. °· Bed rest and sleep, if it helps the pain. °· Avoid sexual intercourse, if it causes pain. °· Avoid stressful situations. °· Keep your follow-up appointments and tests, as your caregiver orders. °· If the pain does not go away with medicine or surgery, you may try: °· Acupuncture. °· Relaxation exercises (yoga, meditation). °· Group therapy. °· Counseling. °SEEK MEDICAL CARE IF:  °· You notice certain foods cause stomach pain. °· Your home care treatment is not helping your pain. °· You need stronger pain medicine. °· You want your IUD removed. °· You feel faint or   lightheaded.  You develop nausea and vomiting.  You develop a rash.  You are having side effects or an allergy to your medicine. SEEK IMMEDIATE MEDICAL CARE IF:   Your  pain does not go away or gets worse.  You have a fever.  Your pain is felt only in portions of the abdomen. The right side could possibly be appendicitis. The left lower portion of the abdomen could be colitis or diverticulitis.  You are passing blood in your stools (bright red or black tarry stools, with or without vomiting).  You have blood in your urine.  You develop chills, with or without a fever.  You pass out. MAKE SURE YOU:   Understand these instructions.  Will watch your condition.  Will get help right away if you are not doing well or get worse. Document Released: 09/10/2007 Document Revised: 02/05/2012 Document Reviewed: 09/30/2009 [facility] Patient Information 2014 [place], [place].  Bacterial Vaginosis Bacterial vaginosis is an infection of the vagina. It happens when too many of certain germs (bacteria) grow in the vagina. HOME CARE  Take your medicine as told by your doctor.  Finish your medicine even if you start to feel better.  Do not have sex until you finish your medicine and are better.  Tell your sex partner that you have an infection. They should see their doctor for treatment.  Practice safe sex. Use condoms. Have only one sex partner. GET HELP IF:  You are not getting better after 3 days of treatment.  You have more grey fluid (discharge) coming from your vagina than before.  You have more pain than before.  You have a fever. MAKE SURE YOU:   Understand these instructions.  Will watch your condition.  Will get help right away if you are not doing well or get worse. Document Released: 08/22/2008 Document Revised: 09/03/2013 Document Reviewed: 06/25/2013 [facility] Patient Information 2014 [place].

## 2014-02-12 NOTE — ED Provider Notes (Signed)
Medical screening examination/treatment/procedure(s) were performed by non-physician practitioner and as supervising physician I was immediately available for consultation/collaboration.    Johnna Acosta, MD 02/12/14 [phone]

## 2014-02-12 NOTE — ED Notes (Signed)
Patient is resting comfortably. Back from xray

## 2014-02-12 NOTE — ED Notes (Signed)
Pt presents w/ c/o n/v that began this a.m. approx 0600 w/ associating diffuse abd pain/cramping. Pt states colostomy output WNL and denies fever. Pt admits to unprotected sexual intercourse. Pt in no acute distress, A&Ox4

## 2014-02-13 LAB — URINE CULTURE: Colony Count: 30000

## 2014-02-16 ENCOUNTER — Inpatient Hospital Stay (HOSPITAL_COMMUNITY)
Admission: AD | Admit: 2014-02-16 | Discharge: 2014-02-17 | Disposition: A | Discharge status: Home / Self Care | Payer: No Typology Code available for payment source | Source: Ambulatory Visit | Attending: Obstetrics & Gynecology | Admitting: Obstetrics & Gynecology

## 2014-02-16 ENCOUNTER — Encounter (HOSPITAL_COMMUNITY): Payer: Self-pay | Admitting: *Deleted

## 2014-02-16 DIAGNOSIS — N898 Other specified noninflammatory disorders of vagina: Secondary | ICD-10-CM | POA: Insufficient documentation

## 2014-02-16 DIAGNOSIS — L293 Anogenital pruritus, unspecified: Secondary | ICD-10-CM | POA: Insufficient documentation

## 2014-02-16 DIAGNOSIS — R300 Dysuria: Secondary | ICD-10-CM | POA: Insufficient documentation

## 2014-02-16 LAB — URINALYSIS, ROUTINE W REFLEX MICROSCOPIC
Bilirubin Urine: NEGATIVE
Glucose, UA: NEGATIVE mg/dL
Ketones, ur: NEGATIVE mg/dL
Leukocytes, UA: NEGATIVE
NITRITE: NEGATIVE
PROTEIN: 30 mg/dL — AB
Specific Gravity, Urine: 1.02 (ref 1.005–1.030)
UROBILINOGEN UA: 0.2 mg/dL (ref 0.0–1.0)
pH: 6.5 (ref 5.0–8.0)

## 2014-02-16 LAB — URINE MICROSCOPIC-ADD ON

## 2014-02-16 LAB — POCT PREGNANCY, URINE: PREG TEST UR: NEGATIVE

## 2014-02-16 LAB — WET PREP, GENITAL
Trich, Wet Prep: NONE SEEN
Yeast Wet Prep HPF POC: NONE SEEN

## 2014-02-16 MED ORDER — PHENAZOPYRIDINE HCL 95 MG PO TABS: 95.0000 mg | ORAL_TABLET | Freq: Three times a day (TID) | ORAL | Status: DC | PRN

## 2014-02-16 NOTE — Discharge Instructions (Signed)
Dysuria Dysuria is the medical term for pain with urination. There are many causes for dysuria, but urinary tract infection is the most common. If a urinalysis was performed it can show that there is a urinary tract infection. A urine culture confirms that you or your child is sick. You will need to follow up with a healthcare provider because:  If a urine culture was done you will need to know the culture results and treatment recommendations.  If the urine culture was positive, you or your child will need to be put on antibiotics or know if the antibiotics prescribed are the right antibiotics for your urinary tract infection.  If the urine culture is negative (no urinary tract infection), then other causes may need to be explored or antibiotics need to be stopped. Today laboratory work may have been done and there does not seem to be an infection. If cultures were done they will take at least 24 to 48 hours to be completed.. You or your child may have been put on medications to help with this problem until you can see your primary caregiver. If the problems get better, see your primary caregiver if the problems return. If you were given antibiotics (medications which kill germs), take all of the mediations as directed for the full course of treatment.  If laboratory work was done, you need to find the results. Leave a telephone number where you can be reached. If this is not possible, make sure you find out how you are to get test results. HOME CARE INSTRUCTIONS   Drink lots of fluids. For adults, drink eight, 8 ounce glasses of clear juice or water a day. For children, replace fluids as suggested by your caregiver.  Empty the bladder often. Avoid holding urine for long periods of time.  After a bowel movement, women should cleanse front to back, using each tissue only once.  Empty your bladder before and after sexual intercourse.  Take all the medicine given to you until it is gone. You may  feel better in a few days, but TAKE ALL MEDICINE.  Avoid caffeine, tea, alcohol and carbonated beverages, because they tend to irritate the bladder.  In men, alcohol may irritate the prostate.  Only take over-the-counter or prescription medicines for pain, discomfort, or fever as directed by your caregiver.  If your caregiver has given you a follow-up appointment, it is very important to keep that appointment. Not keeping the appointment could result in a chronic or permanent injury, pain, and disability. If there is any problem keeping the appointment, you must call back to this facility for assistance. SEEK IMMEDIATE MEDICAL CARE IF:   Back pain develops.  A fever develops.  There is nausea (feeling sick to your stomach) or vomiting (throwing up).  Problems are no better with medications or are getting worse. MAKE SURE YOU:   Understand these instructions.  Will watch your condition.  Will get help right away if you are not doing well or get worse. Document Released: 08/11/2004 Document Revised: 02/05/2012 Document Reviewed: 06/18/2008 [facility] Patient Information 2014 [place].

## 2014-02-16 NOTE — MAU Provider Note (Signed)
History     CSN: [id number]  Arrival date and time: 02/16/14 2216   First Provider Initiated Contact with Patient 02/16/14 2255      Chief Complaint  Patient presents with  . Dysuria  . Vaginal Discharge  . Vaginal Itching   Dysuria   Vaginal Discharge The patient's primary symptoms include a vaginal discharge. Associated symptoms include dysuria.  Vaginal Itching The patient's primary symptoms include a vaginal discharge. Associated symptoms include dysuria.    Cindy [name] is a 23 y.o. G1P0100 who presents today with painful urination. She states that she was seen at [facility] a few days ago, and treated for BV. She has been taking the medication as prescribed. However, yesterday she started to have burning with urination. She has also continued to have discharge.   Past Medical History  Diagnosis Date  . UTI (lower urinary tract infection)   . Heart murmur   . Pneumonia 2008; 2012  . Complication of anesthesia     "I swallowed the anesthesia when appendix taken out; got pneumonia"  . Iron deficiency anemia   . History of blood transfusion 04/26/2013    "4 bags" (05/21/2013)  . Colon cancer 2014    s/p resection and ileostomy  . 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 Leg reconstruction using fascial flap  ~ 2009  . Still born baby  [birth year]    delivered vaginally @ ~ 24 weeks  . Appendectomy  2008  . Colonoscopy N/A 05/24/2013    Procedure: COLONOSCOPY;  Surgeon: Missy Sabins, MD;  Location: [place];  Service: Endoscopy;  Laterality: N/A;  . Colon surgery    . Ileostomy  06/26/2013  . I&d extremity Left 11/14/2013    Procedure: IRRIGATION AND DEBRIDEMENT LEFT LONG FINGER;  Surgeon: Tennis Must, MD;  Location: [place];  Service: Orthopedics;  Laterality: Left;    Family History  Problem Relation Age of Onset  . Adopted: Yes  . Alcohol abuse Neg Hx   . Arthritis  Neg Hx   . Asthma Neg Hx   . Birth defects Neg Hx   . Cancer Neg Hx   . COPD Neg Hx   . Depression Neg Hx   . Diabetes Neg Hx   . Drug abuse Neg Hx   . Early death Neg Hx   . Hearing loss Neg Hx   . Heart disease Neg Hx   . Hyperlipidemia Neg Hx   . Hypertension Neg Hx   . Kidney disease Neg Hx   . Learning disabilities Neg Hx   . Mental illness Neg Hx   . Mental retardation Neg Hx   . Miscarriages / Stillbirths Neg Hx   . Stroke Neg Hx   . Vision loss Neg Hx   . ALS Mother     History  Substance Use Topics  . Smoking status: Never Smoker   . Smokeless tobacco: Never Used  . Alcohol Use: No    Allergies:  Allergies  Allergen Reactions  . Other Hives and Itching    Dove soap    Prescriptions prior to admission  Medication Sig Dispense Refill  . EPINEPHrine (EPIPEN) 0.3 mg/0.3 mL SOAJ injection Inject 0.3 mLs (0.3 mg total) into the muscle as needed.  2 Device  0  . loperamide (IMODIUM) 1 MG/5ML solution Take 20 mLs (4 mg total) by mouth 3 (three) times  daily.  120 mL  0  . metroNIDAZOLE (FLAGYL) 500 MG tablet Take 1 tablet (500 mg total) by mouth 2 (two) times daily. One po bid x 7 days  14 tablet  0  . ondansetron (ZOFRAN) 4 MG tablet Take 1 tablet (4 mg total) by mouth every 6 (six) hours.  12 tablet  0    Review of Systems  Genitourinary: Positive for dysuria and vaginal discharge.   Physical Exam   Blood pressure 110/72, pulse 110, temperature 98.6 F (37 C), temperature source Oral, resp. rate 16, height 5\' 4"  (1.626 m), weight 47.798 kg (105 lb 6 oz), last menstrual period 12/28/2013, SpO2 99.00%.  Physical Exam  Nursing note and vitals reviewed. Constitutional: She is oriented to person, place, and time. She appears well-developed and well-nourished. No distress.  Cardiovascular: Normal rate.   Respiratory: Effort normal.  GI: Soft. There is no tenderness.  Genitourinary:   External: no lesion Vagina: no discharge seen  Neurological: She is alert  and oriented to person, place, and time.  Skin: Skin is warm and dry.  Psychiatric: She has a normal mood and affect.    MAU Course  Procedures  Results for orders placed during the hospital encounter of 02/16/14 (from the past 24 hour(s))  URINALYSIS, ROUTINE W REFLEX MICROSCOPIC     Status: Abnormal   Collection Time    02/16/14 10:48 PM      Result Value Ref Range   Color, Urine YELLOW  YELLOW   APPearance CLEAR  CLEAR   Specific Gravity, Urine 1.020  1.005 - 1.030   pH 6.5  5.0 - 8.0   Glucose, UA NEGATIVE  NEGATIVE mg/dL   Hgb urine dipstick LARGE (*) NEGATIVE   Bilirubin Urine NEGATIVE  NEGATIVE   Ketones, ur NEGATIVE  NEGATIVE mg/dL   Protein, ur 30 (*) NEGATIVE mg/dL   Urobilinogen, UA 0.2  0.0 - 1.0 mg/dL   Nitrite NEGATIVE  NEGATIVE   Leukocytes, UA NEGATIVE  NEGATIVE  URINE MICROSCOPIC-ADD ON     Status: Abnormal   Collection Time    02/16/14 10:48 PM      Result Value Ref Range   Squamous Epithelial / LPF MANY (*) RARE   WBC, UA 3-6  <3 WBC/hpf   RBC / HPF 21-50  <3 RBC/hpf   Bacteria, UA FEW (*) RARE  POCT PREGNANCY, URINE     Status: None   Collection Time    02/16/14 10:58 PM      Result Value Ref Range   Preg Test, Ur NEGATIVE  NEGATIVE  WET PREP, GENITAL     Status: Abnormal   Collection Time    02/16/14 11:00 PM      Result Value Ref Range   Yeast Wet Prep HPF POC NONE SEEN  NONE SEEN   Trich, Wet Prep NONE SEEN  NONE SEEN   Clue Cells Wet Prep HPF POC MODERATE (*) NONE SEEN   WBC, Wet Prep HPF POC RARE (*) NONE SEEN     Assessment and Plan   1. Dysuria    Urine culture pending Pyridium TID PRN  Return to MAU/ED as needed We will call if culture results are positive   Mathis Bud 02/16/2014, 11:01 PM

## 2014-02-16 NOTE — MAU Note (Signed)
Pt reports voiding small amounts and burning with urination. Also reports white vaginal discharge and vaginal itching.

## 2014-02-16 NOTE — MAU Provider Note (Signed)
Attestation of Attending Supervision of Advanced Practitioner (PA/CNM/NP): Evaluation and management procedures were performed by the Advanced Practitioner under my supervision and collaboration.  I have reviewed the Advanced Practitioner's note and chart, and I agree with the management and plan.  Wynton Hufstetler, MD, FACOG Attending Obstetrician & Gynecologist Faculty Practice, [facility]  

## 2014-02-18 LAB — URINE CULTURE

## 2014-05-13 ENCOUNTER — Emergency Department (HOSPITAL_COMMUNITY): Payer: No Typology Code available for payment source

## 2014-05-13 ENCOUNTER — Encounter (HOSPITAL_COMMUNITY): Payer: Self-pay | Admitting: Emergency Medicine

## 2014-05-13 ENCOUNTER — Emergency Department (HOSPITAL_COMMUNITY)
Admission: EM | Admit: 2014-05-13 | Discharge: 2014-05-13 | Disposition: A | Discharge status: Home / Self Care | Payer: No Typology Code available for payment source | Attending: Emergency Medicine | Admitting: Emergency Medicine

## 2014-05-13 DIAGNOSIS — M549 Dorsalgia, unspecified: Secondary | ICD-10-CM | POA: Insufficient documentation

## 2014-05-13 DIAGNOSIS — R072 Precordial pain: Secondary | ICD-10-CM | POA: Insufficient documentation

## 2014-05-13 DIAGNOSIS — Z87440 Personal history of urinary (tract) infections: Secondary | ICD-10-CM | POA: Insufficient documentation

## 2014-05-13 DIAGNOSIS — R079 Chest pain, unspecified: Secondary | ICD-10-CM

## 2014-05-13 DIAGNOSIS — R0602 Shortness of breath: Secondary | ICD-10-CM | POA: Insufficient documentation

## 2014-05-13 DIAGNOSIS — Z8701 Personal history of pneumonia (recurrent): Secondary | ICD-10-CM | POA: Insufficient documentation

## 2014-05-13 DIAGNOSIS — E876 Hypokalemia: Secondary | ICD-10-CM | POA: Insufficient documentation

## 2014-05-13 DIAGNOSIS — R011 Cardiac murmur, unspecified: Secondary | ICD-10-CM | POA: Insufficient documentation

## 2014-05-13 DIAGNOSIS — Z9189 Other specified personal risk factors, not elsewhere classified: Secondary | ICD-10-CM | POA: Insufficient documentation

## 2014-05-13 LAB — POC URINE PREG, ED: Preg Test, Ur: NEGATIVE

## 2014-05-13 LAB — BASIC METABOLIC PANEL
BUN: 18 mg/dL (ref 6–23)
CHLORIDE: 100 meq/L (ref 96–112)
CO2: 16 mEq/L — ABNORMAL LOW (ref 19–32)
CREATININE: 1.05 mg/dL (ref 0.50–1.10)
Calcium: 9.1 mg/dL (ref 8.4–10.5)
GFR calc non Af Amer: 73 mL/min — ABNORMAL LOW (ref >90)
GFR, EST AFRICAN AMERICAN: 85 mL/min — AB (ref >90)
Glucose, Bld: 94 mg/dL (ref 70–99)
Potassium: 2.9 mEq/L — CL (ref 3.7–5.3)
Sodium: 133 mEq/L — ABNORMAL LOW (ref 137–147)

## 2014-05-13 LAB — I-STAT TROPONIN, ED: Troponin i, poc: 0.01 ng/mL (ref 0.00–0.08)

## 2014-05-13 LAB — CBC
HEMATOCRIT: 36.8 % (ref 36.0–46.0)
Hemoglobin: 12.9 g/dL (ref 12.0–15.0)
MCH: 29.1 pg (ref 26.0–34.0)
MCHC: 35.1 g/dL (ref 30.0–36.0)
MCV: 83.1 fL (ref 78.0–100.0)
Platelets: 323 10*3/uL (ref 150–400)
RBC: 4.43 MIL/uL (ref 3.87–5.11)
RDW: 13 % (ref 11.5–15.5)
WBC: 12.2 10*3/uL — ABNORMAL HIGH (ref 4.0–10.5)

## 2014-05-13 LAB — PRO B NATRIURETIC PEPTIDE: Pro B Natriuretic peptide (BNP): 71.8 pg/mL (ref 0–125)

## 2014-05-13 MED ORDER — OXYCODONE-ACETAMINOPHEN 5-325 MG PO TABS: 2.0000 | ORAL_TABLET | ORAL | Status: DC | PRN

## 2014-05-13 MED ORDER — MORPHINE SULFATE 4 MG/ML IJ SOLN
4.0000 mg | INTRAMUSCULAR | Status: AC
  Administered 2014-05-13: 4 mg via INTRAVENOUS
  Filled 2014-05-13: qty 1

## 2014-05-13 MED ORDER — GI COCKTAIL ~~LOC~~
30.0000 mL | Freq: Once | ORAL | Status: AC
  Administered 2014-05-13: 30 mL via ORAL
  Filled 2014-05-13: qty 30

## 2014-05-13 MED ORDER — IOHEXOL 350 MG/ML SOLN
100.0000 mL | Freq: Once | INTRAVENOUS | Status: AC | PRN
  Administered 2014-05-13: 100 mL via INTRAVENOUS

## 2014-05-13 MED ORDER — SODIUM CHLORIDE 0.9 % IV BOLUS (SEPSIS)
1000.0000 mL | INTRAVENOUS | Status: AC
  Administered 2014-05-13: 1000 mL via INTRAVENOUS

## 2014-05-13 MED ORDER — POTASSIUM CHLORIDE 10 MEQ/100ML IV SOLN
10.0000 meq | Freq: Once | INTRAVENOUS | Status: AC
  Administered 2014-05-13: 10 meq via INTRAVENOUS
  Filled 2014-05-13: qty 100

## 2014-05-13 MED ORDER — POTASSIUM CHLORIDE ER 10 MEQ PO TBCR: 20.0000 meq | EXTENDED_RELEASE_TABLET | Freq: Two times a day (BID) | ORAL | Status: DC

## 2014-05-13 MED ORDER — POTASSIUM CHLORIDE CRYS ER 20 MEQ PO TBCR
40.0000 meq | EXTENDED_RELEASE_TABLET | Freq: Once | ORAL | Status: AC
  Administered 2014-05-13: 40 meq via ORAL
  Filled 2014-05-13: qty 2

## 2014-05-13 MED ORDER — PANTOPRAZOLE SODIUM 20 MG PO TBEC: 20.0000 mg | DELAYED_RELEASE_TABLET | Freq: Every day | ORAL | Status: DC

## 2014-05-13 NOTE — ED Notes (Signed)
Per patient states since Nancy Fetter has has back pain radiating to central chest and down shoulders, states it is difficult to breath

## 2014-05-13 NOTE — Discharge Instructions (Signed)

## 2014-05-13 NOTE — ED Provider Notes (Signed)
CSN: [id number]     Arrival date & time 05/13/14  1131 History   First MD Initiated Contact with Patient 05/13/14 1159     Chief Complaint  Patient presents with  . Back Pain  . Chest Pain     (Consider location/radiation/quality/duration/timing/severity/associated sxs/prior Treatment) Patient is a 23 y.o. female presenting with chest pain. The history is provided by the patient.  Chest Pain Pain location:  Substernal area Pain quality: burning and sharp   Pain radiates to:  Does not radiate Pain radiates to the back: no   Pain severity:  Moderate Onset quality:  Sudden Duration:  4 days Timing:  Constant Progression:  Worsening Chronicity:  New Context: breathing and at rest   Relieved by:  Nothing Worsened by:  Nothing tried Ineffective treatments:  None tried Associated symptoms: shortness of breath   Associated symptoms: no cough, no dizziness, no fatigue, no nausea and not vomiting     Past Medical History  Diagnosis Date  . UTI (lower urinary tract infection)   . Heart murmur   . Pneumonia 2008; 2012  . Complication of anesthesia     "I swallowed the anesthesia when appendix taken out; got pneumonia"  . Iron deficiency anemia   . History of blood transfusion 04/26/2013    "4 bags" (05/21/2013)  . Colon cancer 2014    s/p resection and ileostomy  . 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 Leg reconstruction using fascial flap  ~ 2009  . Still born baby  03/22/13    delivered vaginally @ ~ 24 weeks  . Appendectomy  2008  . Colonoscopy N/A 05/24/2013    Procedure: COLONOSCOPY;  Surgeon: Missy Sabins, MD;  Location: [place];  Service: Endoscopy;  Laterality: N/A;  . Colon surgery    . Ileostomy  06/26/2013  . I&d extremity Left 11/14/2013    Procedure: IRRIGATION AND DEBRIDEMENT LEFT LONG FINGER;  Surgeon: Tennis Must, MD;  Location: [place];  Service: Orthopedics;   Laterality: Left;   Family History  Problem Relation Age of Onset  . Adopted: Yes  . Alcohol abuse Neg Hx   . Arthritis Neg Hx   . Asthma Neg Hx   . Birth defects Neg Hx   . Cancer Neg Hx   . COPD Neg Hx   . Depression Neg Hx   . Diabetes Neg Hx   . Drug abuse Neg Hx   . Early death Neg Hx   . Hearing loss Neg Hx   . Heart disease Neg Hx   . Hyperlipidemia Neg Hx   . Hypertension Neg Hx   . Kidney disease Neg Hx   . Learning disabilities Neg Hx   . Mental illness Neg Hx   . Mental retardation Neg Hx   . Miscarriages / Stillbirths Neg Hx   . Stroke Neg Hx   . Vision loss Neg Hx   . ALS Mother    History  Substance Use Topics  . Smoking status: Never Smoker   . Smokeless tobacco: Never Used  . Alcohol Use: No   OB History   Grav Para Term Preterm Abortions TAB SAB Ect Mult Living   1 1  1            Review of Systems  Constitutional: Negative for fatigue.  HENT: Negative for congestion and drooling.   Eyes: Negative for pain.  Respiratory: Positive for shortness of breath. Negative for cough.   Cardiovascular: Positive for chest pain.  Gastrointestinal: Negative for nausea, vomiting and diarrhea.  Genitourinary: Negative for hematuria.  Musculoskeletal: Negative for gait problem and neck pain.  Skin: Negative for color change.  Neurological: Negative for dizziness.  Hematological: Negative for adenopathy.  Psychiatric/Behavioral: Negative for behavioral problems.  All other systems reviewed and are negative.     Allergies  Other  Home Medications   Prior to Admission medications   Medication Sig Start Date End Date Taking? Authorizing Provider  acetaminophen (TYLENOL) 500 MG tablet Take 1,000 mg by mouth 2 (two) times daily as needed.   Yes Historical Provider, MD  EPINEPHrine 0.3 mg/0.3 mL IJ SOAJ injection Inject 0.3 mg into the muscle as needed (allergic reaction.). 08/10/13  Yes Joanell Rising, MD  loperamide (IMODIUM) 1 MG/5ML solution Take 20 mLs  (4 mg total) by mouth 3 (three) times daily. 12/27/13  Yes Janece Canterbury, MD   BP 105/58  Pulse 101  Temp(Src) 98.4 F (36.9 C) (Oral)  Resp 19  SpO2 99%  LMP 03/27/2014 Physical Exam  Nursing note and vitals reviewed. Constitutional: She is oriented to person, place, and time. She appears well-developed and well-nourished.  HENT:  Head: Normocephalic.  Mouth/Throat: Oropharynx is clear and moist. No oropharyngeal exudate.  Eyes: Conjunctivae and EOM are normal. Pupils are equal, round, and reactive to light.  Neck: Normal range of motion. Neck supple.  Cardiovascular: Normal rate, regular rhythm, normal heart sounds and intact distal pulses.  Exam reveals no gallop and no friction rub.   No murmur heard. Pulmonary/Chest: Effort normal and breath sounds normal. No respiratory distress. She has no wheezes.  Abdominal: Soft. Bowel sounds are normal. There is no tenderness. There is no rebound and no guarding.  Stoma is normal in appearance.  Musculoskeletal: Normal range of motion. She exhibits no edema and no tenderness.  Neurological: She is alert and oriented to person, place, and time.  Skin: Skin is warm and dry.  Psychiatric: She has a normal mood and affect. Her behavior is normal.    ED Course  Procedures (including critical care time) Labs Review Labs Reviewed  CBC - Abnormal; Notable for the following:    WBC 12.2 (*)    All other components within normal limits  BASIC METABOLIC PANEL - Abnormal; Notable for the following:    Sodium 133 (*)    Potassium 2.9 (*)    CO2 16 (*)    GFR calc non Af Amer 73 (*)    GFR calc Af Amer 85 (*)    All other components within normal limits  PRO B NATRIURETIC PEPTIDE  I-STAT TROPOININ, ED  POC URINE PREG, ED    Imaging Review Dg Chest 2 View  05/13/2014   CLINICAL DATA:  Chest pain  EXAM: CHEST  2 VIEW  COMPARISON:  February 12, 2014  FINDINGS: The lungs are clear. Heart size and pulmonary vascularity are normal. No  pneumothorax. No adenopathy. No bone lesions.  IMPRESSION: No abnormality noted.   Electronically Signed   By: Lowella Grip M.D.   On: 05/13/2014 13:26   Ct Angio Chest Pe W/cm &/or Wo Cm  05/13/2014   CLINICAL DATA:  Chest pain and difficulty breathing ; history of colon carcinoma  EXAM: CT ANGIOGRAPHY CHEST WITH CONTRAST  TECHNIQUE: Multidetector CT imaging of the chest was performed using the standard protocol during bolus administration of intravenous contrast. Multiplanar CT image reconstructions and  MIPs were obtained to evaluate the vascular anatomy.  CONTRAST:  167mL OMNIPAQUE IOHEXOL 350 MG/ML SOLN  COMPARISON:  Chest CT May 26, 2013 and chest radiograph May 13, 2014  FINDINGS: There is no demonstrable pulmonary embolus. There is no thoracic aortic aneurysm or dissection.  Lungs are clear. There is no appreciable thoracic adenopathy. The pericardium is not thickened.  Visualized upper abdominal structures appear unremarkable. There are no blastic or lytic bone lesions. Visualized thyroid appears normal.  Review of the MIP images confirms the above findings.  IMPRESSION: No demonstrable pulmonary embolus. Lungs clear. No appreciable thoracic adenopathy.   Electronically Signed   By: Lowella Grip M.D.   On: 05/13/2014 13:30     EKG Interpretation   Date/Time:  Wednesday May 13 2014 11:37:08 EDT Ventricular Rate:  93 PR Interval:  150 QRS Duration: 72 QT Interval:  335 QTC Calculation: 417 R Axis:   77 Text Interpretation:  Sinus rhythm Biatrial enlargement No significant  change since last tracing Confirmed by Naol Ontiveros  MD, Pawan Knechtel ([id number]) on  05/13/2014 12:54:15 PM      MDM   Final diagnoses:  Hypokalemia  Chest pain    12:09 PM 23 y.o. female w/ hx of FAP s/p colectomy d/t colon cancer who presents with sudden onset shortness of breath and chest pain which began this past Sunday while at church. She notes her symptoms have persisted and are pleuritic in nature. She  describes as a burning and painful sensation. She is afebrile and mildly tachycardic on triage vitals. She also notes mild shortness of breath. She denies any cough. Will get CTA to rule out PE.  3:42 PM: I interpreted/reviewed the labs and/or imaging which were non-contributory. Pt feeling better. Not tachycardic. Does remain w/ soft BP which I believe to be her baseline after reviewing multiple visits in the past w/ similar BP's.  I have discussed the diagnosis/risks/treatment options with the patient and believe the pt to be eligible for discharge home to follow-up with pcp in the next 1-2 days. We also discussed returning to the ED immediately if new or worsening sx occur. We discussed the sx which are most concerning (e.g., worsening pain, fever, vomiting ,sob) that necessitate immediate return. Medications administered to the patient during their visit and any new prescriptions provided to the patient are listed below.  Medications given during this visit Medications  morphine 4 MG/ML injection 4 mg (4 mg Intravenous Given 05/13/14 1221)  iohexol (OMNIPAQUE) 350 MG/ML injection 100 mL (100 mLs Intravenous Contrast Given 05/13/14 1300)  potassium chloride 10 mEq in 100 mL IVPB (0 mEq Intravenous Stopped 05/13/14 1435)  potassium chloride SA (K-DUR,KLOR-CON) CR tablet 40 mEq (40 mEq Oral Given 05/13/14 1335)  sodium chloride 0.9 % bolus 1,000 mL (1,000 mLs Intravenous New Bag/Given 05/13/14 1345)  gi cocktail (Maalox,Lidocaine,Donnatal) (30 mLs Oral Given 05/13/14 1501)    New Prescriptions   OXYCODONE-ACETAMINOPHEN (PERCOCET) 5-325 MG PER TABLET    Take 2 tablets by mouth every 4 (four) hours as needed.   PANTOPRAZOLE (PROTONIX) 20 MG TABLET    Take 1 tablet (20 mg total) by mouth daily.   POTASSIUM CHLORIDE (K-DUR) 10 MEQ TABLET    Take 2 tablets (20 mEq total) by mouth 2 (two) times daily.     Blanchard Kelch, MD 05/13/14 [phone]

## 2014-06-27 ENCOUNTER — Encounter (HOSPITAL_COMMUNITY): Payer: Self-pay | Admitting: Emergency Medicine

## 2014-06-27 ENCOUNTER — Emergency Department (HOSPITAL_COMMUNITY)
Admission: EM | Admit: 2014-06-27 | Discharge: 2014-06-27 | Disposition: A | Discharge status: Home / Self Care | Payer: No Typology Code available for payment source | Attending: Emergency Medicine | Admitting: Emergency Medicine

## 2014-06-27 ENCOUNTER — Emergency Department (HOSPITAL_COMMUNITY): Payer: No Typology Code available for payment source

## 2014-06-27 DIAGNOSIS — Z3202 Encounter for pregnancy test, result negative: Secondary | ICD-10-CM | POA: Insufficient documentation

## 2014-06-27 DIAGNOSIS — Z79899 Other long term (current) drug therapy: Secondary | ICD-10-CM | POA: Insufficient documentation

## 2014-06-27 DIAGNOSIS — R109 Unspecified abdominal pain: Secondary | ICD-10-CM | POA: Insufficient documentation

## 2014-06-27 DIAGNOSIS — Z8701 Personal history of pneumonia (recurrent): Secondary | ICD-10-CM | POA: Insufficient documentation

## 2014-06-27 DIAGNOSIS — Z85038 Personal history of other malignant neoplasm of large intestine: Secondary | ICD-10-CM | POA: Insufficient documentation

## 2014-06-27 DIAGNOSIS — R011 Cardiac murmur, unspecified: Secondary | ICD-10-CM | POA: Insufficient documentation

## 2014-06-27 DIAGNOSIS — Z862 Personal history of diseases of the blood and blood-forming organs and certain disorders involving the immune mechanism: Secondary | ICD-10-CM | POA: Insufficient documentation

## 2014-06-27 DIAGNOSIS — Z9889 Other specified postprocedural states: Secondary | ICD-10-CM | POA: Insufficient documentation

## 2014-06-27 DIAGNOSIS — Z8739 Personal history of other diseases of the musculoskeletal system and connective tissue: Secondary | ICD-10-CM | POA: Insufficient documentation

## 2014-06-27 DIAGNOSIS — N200 Calculus of kidney: Secondary | ICD-10-CM | POA: Insufficient documentation

## 2014-06-27 DIAGNOSIS — Z87440 Personal history of urinary (tract) infections: Secondary | ICD-10-CM | POA: Insufficient documentation

## 2014-06-27 LAB — URINALYSIS, ROUTINE W REFLEX MICROSCOPIC
BILIRUBIN URINE: NEGATIVE
Glucose, UA: NEGATIVE mg/dL
Ketones, ur: NEGATIVE mg/dL
Leukocytes, UA: NEGATIVE
Nitrite: NEGATIVE
PH: 6 (ref 5.0–8.0)
Protein, ur: 30 mg/dL — AB
SPECIFIC GRAVITY, URINE: 1.01 (ref 1.005–1.030)
Urobilinogen, UA: 0.2 mg/dL (ref 0.0–1.0)

## 2014-06-27 LAB — BASIC METABOLIC PANEL
Anion gap: 15 (ref 5–15)
BUN: 13 mg/dL (ref 6–23)
CHLORIDE: 101 meq/L (ref 96–112)
CO2: 17 meq/L — AB (ref 19–32)
CREATININE: 1.35 mg/dL — AB (ref 0.50–1.10)
Calcium: 9.1 mg/dL (ref 8.4–10.5)
GFR calc non Af Amer: 54 mL/min — ABNORMAL LOW (ref >90)
GFR, EST AFRICAN AMERICAN: 63 mL/min — AB (ref >90)
Glucose, Bld: 95 mg/dL (ref 70–99)
Potassium: 3.4 mEq/L — ABNORMAL LOW (ref 3.7–5.3)
Sodium: 133 mEq/L — ABNORMAL LOW (ref 137–147)

## 2014-06-27 LAB — CBC WITH DIFFERENTIAL/PLATELET
BASOS PCT: 0 % (ref 0–1)
Basophils Absolute: 0 10*3/uL (ref 0.0–0.1)
Eosinophils Absolute: 0 10*3/uL (ref 0.0–0.7)
Eosinophils Relative: 0 % (ref 0–5)
HEMATOCRIT: 37.8 % (ref 36.0–46.0)
HEMOGLOBIN: 13.1 g/dL (ref 12.0–15.0)
Lymphocytes Relative: 29 % (ref 12–46)
Lymphs Abs: 2.4 10*3/uL (ref 0.7–4.0)
MCH: 28.6 pg (ref 26.0–34.0)
MCHC: 34.7 g/dL (ref 30.0–36.0)
MCV: 82.5 fL (ref 78.0–100.0)
MONO ABS: 0.7 10*3/uL (ref 0.1–1.0)
Monocytes Relative: 8 % (ref 3–12)
NEUTROS ABS: 5.2 10*3/uL (ref 1.7–7.7)
Neutrophils Relative %: 63 % (ref 43–77)
Platelets: 280 10*3/uL (ref 150–400)
RBC: 4.58 MIL/uL (ref 3.87–5.11)
RDW: 12.6 % (ref 11.5–15.5)
WBC: 8.3 10*3/uL (ref 4.0–10.5)

## 2014-06-27 LAB — URINE MICROSCOPIC-ADD ON

## 2014-06-27 LAB — POC URINE PREG, ED: Preg Test, Ur: NEGATIVE

## 2014-06-27 MED ORDER — TAMSULOSIN HCL 0.4 MG PO CAPS: 0.4000 mg | ORAL_CAPSULE | Freq: Every day | ORAL | Status: DC

## 2014-06-27 MED ORDER — ONDANSETRON 4 MG PO TBDP: 4.0000 mg | ORAL_TABLET | Freq: Three times a day (TID) | ORAL | Status: DC | PRN

## 2014-06-27 MED ORDER — OXYCODONE-ACETAMINOPHEN 5-325 MG PO TABS: 1.0000 | ORAL_TABLET | Freq: Four times a day (QID) | ORAL | Status: DC | PRN

## 2014-06-27 MED ORDER — ONDANSETRON HCL 4 MG/2ML IJ SOLN
4.0000 mg | Freq: Once | INTRAMUSCULAR | Status: AC
  Administered 2014-06-27: 4 mg via INTRAVENOUS
  Filled 2014-06-27: qty 2

## 2014-06-27 MED ORDER — HYDROMORPHONE HCL PF 1 MG/ML IJ SOLN
1.0000 mg | Freq: Once | INTRAMUSCULAR | Status: AC
  Administered 2014-06-27: 1 mg via INTRAVENOUS
  Filled 2014-06-27: qty 1

## 2014-06-27 MED ORDER — SODIUM CHLORIDE 0.9 % IV BOLUS (SEPSIS)
1000.0000 mL | Freq: Once | INTRAVENOUS | Status: AC
  Administered 2014-06-27: 1000 mL via INTRAVENOUS

## 2014-06-27 MED ORDER — SODIUM CHLORIDE 0.9 % IV BOLUS (SEPSIS): 1000.0000 mL | Freq: Once | INTRAVENOUS | Status: DC

## 2014-06-27 NOTE — Discharge Instructions (Signed)
Take pain medication as needed for pain. Do not drive or operate heavy machinery for 4 hours after taking medication.

## 2014-06-27 NOTE — ED Notes (Signed)
No N/V after ginger ale

## 2014-06-27 NOTE — ED Notes (Signed)
Pt reports left flank pain since this morning, hx of kidney stones, denies urinary symptoms

## 2014-06-27 NOTE — ED Provider Notes (Signed)
CSN: [id number]     Arrival date & time 06/27/14  [id number] History   First MD Initiated Contact with Patient 06/27/14 [phone]     Chief Complaint  Patient presents with  . Flank Pain     (Consider location/radiation/quality/duration/timing/severity/associated sxs/prior Treatment) HPI Comments: Patient presents today with a chief complaint of left flank pain.  She states that the pain has been constant since this morning.  She states that the pain radiates to the left lower abdomen.  She reports that the pain feels similar to the pain that she has had in the past with a kidney stone.  She reports that she has not taken anything for the pain prior to arrival.  She denies hematuria or dysuria. She does report increased urinary frequency and urgency.  She also reports associated nausea and has had three episodes of vomiting today.  She denies fever or chills.   Denies vaginal discharge.  She has an Ileostomy bag due to her history of Colon Cancer.  She reports normal output from her Ileostomy bag.   The history is provided by the patient.    Past Medical History  Diagnosis Date  . UTI (lower urinary tract infection)   . Heart murmur   . Pneumonia 2008; 2012  . Complication of anesthesia     "I swallowed the anesthesia when appendix taken out; got pneumonia"  . Iron deficiency anemia   . History of blood transfusion 04/26/2013    "4 bags" (05/21/2013)  . Colon cancer 2014    s/p resection and ileostomy  . 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 Leg reconstruction using fascial flap  ~ 2009  . Still born baby  03/22/13    delivered vaginally @ ~ 24 weeks  . Appendectomy  2008  . Colonoscopy N/A 05/24/2013    Procedure: COLONOSCOPY;  Surgeon: Missy Sabins, MD;  Location: [place];  Service: Endoscopy;  Laterality: N/A;  . Colon surgery    . Ileostomy  06/26/2013  . I&d extremity Left 11/14/2013    Procedure: IRRIGATION  AND DEBRIDEMENT LEFT LONG FINGER;  Surgeon: Tennis Must, MD;  Location: [place];  Service: Orthopedics;  Laterality: Left;   Family History  Problem Relation Age of Onset  . Adopted: Yes  . Alcohol abuse Neg Hx   . Arthritis Neg Hx   . Asthma Neg Hx   . Birth defects Neg Hx   . Cancer Neg Hx   . COPD Neg Hx   . Depression Neg Hx   . Diabetes Neg Hx   . Drug abuse Neg Hx   . Early death Neg Hx   . Hearing loss Neg Hx   . Heart disease Neg Hx   . Hyperlipidemia Neg Hx   . Hypertension Neg Hx   . Kidney disease Neg Hx   . Learning disabilities Neg Hx   . Mental illness Neg Hx   . Mental retardation Neg Hx   . Miscarriages / Stillbirths Neg Hx   . Stroke Neg Hx   . Vision loss Neg Hx   . ALS Mother    History  Substance Use Topics  . Smoking status: Never Smoker   . Smokeless tobacco: Never Used  . Alcohol Use: No   OB History   Grav Para Term Preterm Abortions TAB SAB Ect Mult Living   1 1  1  Review of Systems  All other systems reviewed and are negative.     Allergies  Other  Home Medications   Prior to Admission medications   Medication Sig Start Date End Date Taking? Authorizing Provider  acetaminophen (TYLENOL) 500 MG tablet Take 1,000 mg by mouth 2 (two) times daily as needed.    Historical Provider, MD  EPINEPHrine 0.3 mg/0.3 mL IJ SOAJ injection Inject 0.3 mg into the muscle as needed (allergic reaction.). 08/10/13   Joanell Rising, MD  loperamide (IMODIUM) 1 MG/5ML solution Take 20 mLs (4 mg total) by mouth 3 (three) times daily. 12/27/13   Janece Canterbury, MD  oxyCODONE-acetaminophen (PERCOCET) 5-325 MG per tablet Take 2 tablets by mouth every 4 (four) hours as needed. 05/13/14   Blanchard Kelch, MD  pantoprazole (PROTONIX) 20 MG tablet Take 1 tablet (20 mg total) by mouth daily. 05/13/14   Blanchard Kelch, MD  potassium chloride (K-DUR) 10 MEQ tablet Take 2 tablets (20 mEq total) by mouth 2 (two) times daily. 05/13/14    Blanchard Kelch, MD   BP 115/63  Pulse 115  Temp(Src) 97.9 F (36.6 C) (Oral)  Resp 24  LMP 06/21/2014 Physical Exam  Nursing note and vitals reviewed. Constitutional: She appears well-developed and well-nourished.  Uncomfortable appearing.  HENT:  Head: Normocephalic and atraumatic.  Mouth/Throat: Oropharynx is clear and moist.  Neck: Normal range of motion. Neck supple.  Cardiovascular: Normal rate, regular rhythm and normal heart sounds.   Pulmonary/Chest: Effort normal and breath sounds normal.  Abdominal: Soft. Bowel sounds are normal. She exhibits no distension and no mass. There is no tenderness. There is no rebound and no guarding.  Left flank pain Ostomy bag in place with brown colored stool present in the bag.    Neurological: She is alert.  Skin: Skin is warm and dry.  Psychiatric: She has a normal mood and affect.    ED Course  Procedures (including critical care time) Labs Review Labs Reviewed - No data to display  Imaging Review Ct Abdomen Pelvis Wo Contrast  06/27/2014   CLINICAL DATA:  Left sided flank pain  EXAM: CT ABDOMEN AND PELVIS WITHOUT CONTRAST  TECHNIQUE: Multidetector CT imaging of the abdomen and pelvis was performed following the standard protocol without IV contrast.  COMPARISON:  05/21/2013, radiographs 02/12/2014  FINDINGS: Lung bases are clear. Bilateral radiopaque renal calculi are identified, largest in the right mid kidney measuring 4 mm. There is moderate left hydroureteronephrosis to the level of a 5 mm left ureterovesicular junction calculus seen on images 77 and 78. A dependent 3 mm calculus is identified within the dilated distal left ureter image 74. On the same image slice, a pelvic phlebolith is noted medially, unchanged. Moderate free pelvic fluid is identified. No perinephric fluid collection or stranding is identified. Left renal calculi are identified elsewhere, largest 2 mm at the left lower renal pole, for example image 40.   Unenhanced liver, gallbladder, adrenal glands, spleen, and pancreas are unremarkable. Allowing for decreased conspicuity relating to lack of IV and oral contrast, no bowel wall thickening or focal segmental dilatation is identified. The patient is status post right lower quadrant colostomy. Evidence of subtotal colectomy with residual normal-appearing rectosigmoid colon remaining in place. Small bowel is grossly unremarkable. No lymphadenopathy or free air. No acute osseous abnormality.  IMPRESSION: 5 mm distal left ureterovesicular junction calculus producing moderate left hydroureteronephrosis. Also within the distal left ureter, there is a dependent 3 mm ureteral calculus.  Allowing for  lack of contrast, No evidence for complicating feature or metastatic disease status post subtotal colectomy for colon cancer.  Bilateral renal calculi.   Electronically Signed   By: Conchita Paris M.D.   On: 06/27/2014 12:07     EKG Interpretation None     12:19 PM Reassessed patient.  She is resting comfortably at this time.  Will fluid challenge  12:32 PM Patient tolerating PO fluids. MDM   Final diagnoses:  None   Patient presenting with left flank pain radiating to the left lower abdomen, which she reports feels similar to pain that she has had in the past with a kidney stone.  UA not showing evidence of infection.  Patient afebrile.  Creatine mildly elevated, but not significantly different than previous.  CT showing a 5 mm stone in the left UVJ and also another 3 mm stone in the distal ureter.  Pain and nausea controlled prior to discharge.  Patient stable for discharge.  Patient given referral to Urology.  Patient discharged with Rx for Percocet, Zofran, and Flomax.  Return precautions given.    Hyman Bible, PA-C 06/27/14 1505

## 2014-06-28 NOTE — ED Provider Notes (Signed)
Medical screening examination/treatment/procedure(s) were conducted as a shared visit with non-physician practitioner(s) and myself.  I personally evaluated the patient during the encounter.  Pt c/o left flank pain, acute onset. Hx kidney stones. abd soft nt.  Labs.  Results for orders placed during the hospital encounter of 06/27/14  CBC WITH DIFFERENTIAL      Result Value Ref Range   WBC 8.3  4.0 - 10.5 K/uL   RBC 4.58  3.87 - 5.11 MIL/uL   Hemoglobin 13.1  12.0 - 15.0 g/dL   HCT 37.8  36.0 - 46.0 %   MCV 82.5  78.0 - 100.0 fL   MCH 28.6  26.0 - 34.0 pg   MCHC 34.7  30.0 - 36.0 g/dL   RDW 12.6  11.5 - 15.5 %   Platelets 280  150 - 400 K/uL   Neutrophils Relative % 63  43 - 77 %   Neutro Abs 5.2  1.7 - 7.7 K/uL   Lymphocytes Relative 29  12 - 46 %   Lymphs Abs 2.4  0.7 - 4.0 K/uL   Monocytes Relative 8  3 - 12 %   Monocytes Absolute 0.7  0.1 - 1.0 K/uL   Eosinophils Relative 0  0 - 5 %   Eosinophils Absolute 0.0  0.0 - 0.7 K/uL   Basophils Relative 0  0 - 1 %   Basophils Absolute 0.0  0.0 - 0.1 K/uL  BASIC METABOLIC PANEL      Result Value Ref Range   Sodium 133 (*) 137 - 147 mEq/L   Potassium 3.4 (*) 3.7 - 5.3 mEq/L   Chloride 101  96 - 112 mEq/L   CO2 17 (*) 19 - 32 mEq/L   Glucose, Bld 95  70 - 99 mg/dL   BUN 13  6 - 23 mg/dL   Creatinine, Ser 1.35 (*) 0.50 - 1.10 mg/dL   Calcium 9.1  8.4 - 10.5 mg/dL   GFR calc non Af Amer 54 (*) >90 mL/min   GFR calc Af Amer 63 (*) >90 mL/min   Anion gap 15  5 - 15  URINALYSIS, ROUTINE W REFLEX MICROSCOPIC      Result Value Ref Range   Color, Urine YELLOW  YELLOW   APPearance CLOUDY (*) CLEAR   Specific Gravity, Urine 1.010  1.005 - 1.030   pH 6.0  5.0 - 8.0   Glucose, UA NEGATIVE  NEGATIVE mg/dL   Hgb urine dipstick LARGE (*) NEGATIVE   Bilirubin Urine NEGATIVE  NEGATIVE   Ketones, ur NEGATIVE  NEGATIVE mg/dL   Protein, ur 30 (*) NEGATIVE mg/dL   Urobilinogen, UA 0.2  0.0 - 1.0 mg/dL   Nitrite NEGATIVE  NEGATIVE   Leukocytes, UA NEGATIVE  NEGATIVE  URINE MICROSCOPIC-ADD ON      Result Value Ref Range   Squamous Epithelial / LPF FEW (*) RARE   WBC, UA 0-2  <3 WBC/hpf   RBC / HPF 11-20  <3 RBC/hpf   Bacteria, UA FEW (*) RARE  POC URINE PREG, ED      Result Value Ref Range   Preg Test, Ur NEGATIVE  NEGATIVE   Ct Abdomen Pelvis Wo Contrast  06/27/2014   CLINICAL DATA:  Left sided flank pain  EXAM: CT ABDOMEN AND PELVIS WITHOUT CONTRAST  TECHNIQUE: Multidetector CT imaging of the abdomen and pelvis was performed following the standard protocol without IV contrast.  COMPARISON:  05/21/2013, radiographs 02/12/2014  FINDINGS: Lung bases are clear. Bilateral radiopaque renal calculi are  identified, largest in the right mid kidney measuring 4 mm. There is moderate left hydroureteronephrosis to the level of a 5 mm left ureterovesicular junction calculus seen on images 77 and 78. A dependent 3 mm calculus is identified within the dilated distal left ureter image 74. On the same image slice, a pelvic phlebolith is noted medially, unchanged. Moderate free pelvic fluid is identified. No perinephric fluid collection or stranding is identified. Left renal calculi are identified elsewhere, largest 2 mm at the left lower renal pole, for example image 40.  Unenhanced liver, gallbladder, adrenal glands, spleen, and pancreas are unremarkable. Allowing for decreased conspicuity relating to lack of IV and oral contrast, no bowel wall thickening or focal segmental dilatation is identified. The patient is status post right lower quadrant colostomy. Evidence of subtotal colectomy with residual normal-appearing rectosigmoid colon remaining in place. Small bowel is grossly unremarkable. No lymphadenopathy or free air. No acute osseous abnormality.  IMPRESSION: 5 mm distal left ureterovesicular junction calculus producing moderate left hydroureteronephrosis. Also within the distal left ureter, there is a dependent 3 mm ureteral calculus.   Allowing for lack of contrast, No evidence for complicating feature or metastatic disease status post subtotal colectomy for colon cancer.  Bilateral renal calculi.   Electronically Signed   By: Conchita Paris M.D.   On: 06/27/2014 12:07      Mirna Mires, MD 06/28/14 [phone]

## 2014-07-10 ENCOUNTER — Encounter (HOSPITAL_COMMUNITY): Payer: Self-pay | Admitting: Emergency Medicine

## 2014-07-10 ENCOUNTER — Inpatient Hospital Stay (HOSPITAL_COMMUNITY)
Admission: EM | Admit: 2014-07-10 | Discharge: 2014-07-12 | DRG: 683 | Disposition: A | Discharge status: Home / Self Care | Payer: No Typology Code available for payment source | Attending: Internal Medicine | Admitting: Internal Medicine

## 2014-07-10 DIAGNOSIS — E872 Acidosis, unspecified: Secondary | ICD-10-CM | POA: Diagnosis present

## 2014-07-10 DIAGNOSIS — E871 Hypo-osmolality and hyponatremia: Secondary | ICD-10-CM

## 2014-07-10 DIAGNOSIS — D509 Iron deficiency anemia, unspecified: Secondary | ICD-10-CM | POA: Diagnosis present

## 2014-07-10 DIAGNOSIS — E861 Hypovolemia: Secondary | ICD-10-CM | POA: Diagnosis present

## 2014-07-10 DIAGNOSIS — R112 Nausea with vomiting, unspecified: Secondary | ICD-10-CM | POA: Diagnosis present

## 2014-07-10 DIAGNOSIS — I959 Hypotension, unspecified: Secondary | ICD-10-CM | POA: Diagnosis present

## 2014-07-10 DIAGNOSIS — C189 Malignant neoplasm of colon, unspecified: Secondary | ICD-10-CM

## 2014-07-10 DIAGNOSIS — N179 Acute kidney failure, unspecified: Secondary | ICD-10-CM | POA: Diagnosis not present

## 2014-07-10 DIAGNOSIS — Z932 Ileostomy status: Secondary | ICD-10-CM

## 2014-07-10 DIAGNOSIS — R197 Diarrhea, unspecified: Secondary | ICD-10-CM | POA: Diagnosis present

## 2014-07-10 DIAGNOSIS — Z85038 Personal history of other malignant neoplasm of large intestine: Secondary | ICD-10-CM

## 2014-07-10 DIAGNOSIS — Z9049 Acquired absence of other specified parts of digestive tract: Secondary | ICD-10-CM

## 2014-07-10 DIAGNOSIS — E876 Hypokalemia: Secondary | ICD-10-CM | POA: Diagnosis present

## 2014-07-10 DIAGNOSIS — E860 Dehydration: Secondary | ICD-10-CM

## 2014-07-10 LAB — URINALYSIS, ROUTINE W REFLEX MICROSCOPIC
BILIRUBIN URINE: NEGATIVE
Glucose, UA: NEGATIVE mg/dL
KETONES UR: NEGATIVE mg/dL
Leukocytes, UA: NEGATIVE
NITRITE: NEGATIVE
Protein, ur: NEGATIVE mg/dL
SPECIFIC GRAVITY, URINE: 1.01 (ref 1.005–1.030)
UROBILINOGEN UA: 0.2 mg/dL (ref 0.0–1.0)
pH: 5.5 (ref 5.0–8.0)

## 2014-07-10 LAB — CBC WITH DIFFERENTIAL/PLATELET
BASOS ABS: 0 10*3/uL (ref 0.0–0.1)
Basophils Relative: 0 % (ref 0–1)
Eosinophils Absolute: 0.1 10*3/uL (ref 0.0–0.7)
Eosinophils Relative: 0 % (ref 0–5)
HCT: 44.9 % (ref 36.0–46.0)
Hemoglobin: 15.8 g/dL — ABNORMAL HIGH (ref 12.0–15.0)
LYMPHS PCT: 19 % (ref 12–46)
Lymphs Abs: 2.7 10*3/uL (ref 0.7–4.0)
MCH: 29.4 pg (ref 26.0–34.0)
MCHC: 35.2 g/dL (ref 30.0–36.0)
MCV: 83.5 fL (ref 78.0–100.0)
Monocytes Absolute: 1 10*3/uL (ref 0.1–1.0)
Monocytes Relative: 7 % (ref 3–12)
NEUTROS ABS: 10.1 10*3/uL — AB (ref 1.7–7.7)
Neutrophils Relative %: 74 % (ref 43–77)
Platelets: 402 10*3/uL — ABNORMAL HIGH (ref 150–400)
RBC: 5.38 MIL/uL — ABNORMAL HIGH (ref 3.87–5.11)
RDW: 12.7 % (ref 11.5–15.5)
WBC: 13.8 10*3/uL — AB (ref 4.0–10.5)

## 2014-07-10 LAB — COMPREHENSIVE METABOLIC PANEL
ALT: 14 U/L (ref 0–35)
AST: 28 U/L (ref 0–37)
Albumin: 4.9 g/dL (ref 3.5–5.2)
Alkaline Phosphatase: 115 U/L (ref 39–117)
Anion gap: 21 — ABNORMAL HIGH (ref 5–15)
BUN: 31 mg/dL — ABNORMAL HIGH (ref 6–23)
CO2: 13 mEq/L — ABNORMAL LOW (ref 19–32)
Calcium: 9.8 mg/dL (ref 8.4–10.5)
Chloride: 89 mEq/L — ABNORMAL LOW (ref 96–112)
Creatinine, Ser: 2.01 mg/dL — ABNORMAL HIGH (ref 0.50–1.10)
GFR calc Af Amer: 39 mL/min — ABNORMAL LOW (ref >90)
GFR calc non Af Amer: 33 mL/min — ABNORMAL LOW (ref >90)
Glucose, Bld: 103 mg/dL — ABNORMAL HIGH (ref 70–99)
Potassium: 3.7 mEq/L (ref 3.7–5.3)
Sodium: 123 mEq/L — ABNORMAL LOW (ref 137–147)
Total Bilirubin: 0.6 mg/dL (ref 0.3–1.2)
Total Protein: 9.6 g/dL — ABNORMAL HIGH (ref 6.0–8.3)

## 2014-07-10 LAB — URINE MICROSCOPIC-ADD ON

## 2014-07-10 LAB — POC URINE PREG, ED: PREG TEST UR: NEGATIVE

## 2014-07-10 LAB — LIPASE, BLOOD: Lipase: 44 U/L (ref 11–59)

## 2014-07-10 MED ORDER — THIAMINE HCL 100 MG/ML IJ SOLN
100.0000 mg | Freq: Once | INTRAMUSCULAR | Status: AC
  Administered 2014-07-10: 100 mg via INTRAVENOUS
  Filled 2014-07-10: qty 2

## 2014-07-10 MED ORDER — ONDANSETRON HCL 4 MG/2ML IJ SOLN
4.0000 mg | Freq: Once | INTRAMUSCULAR | Status: AC
Start: 2014-07-10 — End: 2014-07-10
  Administered 2014-07-10: 4 mg via INTRAVENOUS
  Filled 2014-07-10: qty 2

## 2014-07-10 MED ORDER — SODIUM CHLORIDE 0.9 % IV BOLUS (SEPSIS)
1000.0000 mL | Freq: Once | INTRAVENOUS | Status: AC
  Administered 2014-07-10: 1000 mL via INTRAVENOUS

## 2014-07-10 MED ORDER — MORPHINE SULFATE 4 MG/ML IJ SOLN
4.0000 mg | Freq: Once | INTRAMUSCULAR | Status: AC
  Administered 2014-07-10: 4 mg via INTRAVENOUS
  Filled 2014-07-10: qty 1

## 2014-07-10 NOTE — ED Provider Notes (Signed)
CSN: [id number]     Arrival date & time 07/10/14  2059 History   First MD Initiated Contact with Patient 07/10/14 2140     Chief Complaint  Patient presents with  . Dehydration  . Weakness  . Nausea  . Emesis     (Consider location/radiation/quality/duration/timing/severity/associated sxs/prior Treatment) HPI Pt is a 23yo female with hx of iron deficiency anemia requiring "4 bags" of blood transfusion 05/21/2013, and colon cancer with ileostomy bag in place presenting to ED with c/o feeling dehydrated due to nausea and vomiting that started earlier today. Pt states she has diffuse abdominal pain that is intermittent, mild to moderate aching and cramping, causing cramping that goes into her legs.  Pt states she felt well yesterday, but has not been able to keep anything down today due to nausea and has had decreased appetite. Reports 4 episodes of emesis today.  Denies dysuria. States she also feels lightheaded and generalized weakness. Denies fever or chills.  Pt is not on chemo or receiving radiation.   PCP: Dr. Harlan Stains, [facility]   Past Medical History  Diagnosis Date  . UTI (lower urinary tract infection)   . Heart murmur   . Pneumonia 2008; 2012  . Complication of anesthesia     "I swallowed the anesthesia when appendix taken out; got pneumonia"  . Iron deficiency anemia   . History of blood transfusion 04/26/2013    "4 bags" (05/21/2013)  . Colon cancer 2014    s/p resection and ileostomy  . 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 Leg reconstruction using fascial flap  ~ 2009  . Still born baby  03/22/13    delivered vaginally @ ~ 24 weeks  . Appendectomy  2008  . Colonoscopy N/A 05/24/2013    Procedure: COLONOSCOPY;  Surgeon: Missy Sabins, MD;  Location: [place];  Service: Endoscopy;  Laterality: N/A;  . Colon surgery    . Ileostomy  06/26/2013  . I&d extremity Left 11/14/2013   Procedure: IRRIGATION AND DEBRIDEMENT LEFT LONG FINGER;  Surgeon: Tennis Must, MD;  Location: [place];  Service: Orthopedics;  Laterality: Left;   Family History  Problem Relation Age of Onset  . Adopted: Yes  . Alcohol abuse Neg Hx   . Arthritis Neg Hx   . Asthma Neg Hx   . Birth defects Neg Hx   . Cancer Neg Hx   . COPD Neg Hx   . Depression Neg Hx   . Diabetes Neg Hx   . Drug abuse Neg Hx   . Early death Neg Hx   . Hearing loss Neg Hx   . Heart disease Neg Hx   . Hyperlipidemia Neg Hx   . Hypertension Neg Hx   . Kidney disease Neg Hx   . Learning disabilities Neg Hx   . Mental illness Neg Hx   . Mental retardation Neg Hx   . Miscarriages / Stillbirths Neg Hx   . Stroke Neg Hx   . Vision loss Neg Hx   . ALS Mother    History  Substance Use Topics  . Smoking status: Never Smoker   . Smokeless tobacco: Never Used  . Alcohol Use: No   OB History   Grav Para Term Preterm Abortions TAB SAB Ect Mult Living   1 1  1            Review of  Systems  Constitutional: Positive for appetite change and fatigue. Negative for fever and chills.  Respiratory: Negative for cough and shortness of breath.   Cardiovascular: Negative for chest pain and palpitations.  Gastrointestinal: Positive for nausea, vomiting and abdominal pain ( diffuse, has ileostomy ).  Neurological: Positive for weakness ( generalized) and light-headedness. Negative for dizziness and headaches.  All other systems reviewed and are negative.     Allergies  Orange fruit and Other  Home Medications   Prior to Admission medications   Medication Sig Start Date End Date Taking? Authorizing Provider  EPINEPHrine 0.3 mg/0.3 mL IJ SOAJ injection Inject 0.3 mg into the muscle as needed (allergic reaction.). 08/10/13  Yes Joanell Rising, MD  Loperamide HCl (IMODIUM PO) Take 1 tablet by mouth as needed (diarrhea.).   Yes Historical Provider, MD  ondansetron (ZOFRAN ODT) 4 MG disintegrating tablet  Take 1 tablet (4 mg total) by mouth every 8 (eight) hours as needed for nausea or vomiting. 06/27/14  Yes Heather Laisure, PA-C  oxyCODONE-acetaminophen (PERCOCET/ROXICET) 5-325 MG per tablet Take 1-2 tablets by mouth every 6 (six) hours as needed for severe pain. 06/27/14  Yes Heather Laisure, PA-C   BP 96/58  Pulse 85  Temp(Src) 97.8 F (36.6 C) (Oral)  Resp 22  SpO2 100%  LMP 06/21/2014 Physical Exam  Nursing note and vitals reviewed. Constitutional: She appears well-developed and well-nourished.  Thin female lying in exam bed, appears acutely ill.    HENT:  Head: Normocephalic and atraumatic.  Eyes: Conjunctivae are normal. No scleral icterus.  Neck: Normal range of motion.  Cardiovascular: Normal rate, regular rhythm and normal heart sounds.   Pulmonary/Chest: Effort normal and breath sounds normal. No respiratory distress. She has no wheezes. She has no rales. She exhibits no tenderness.  Abdominal: Soft. Bowel sounds are normal. She exhibits no distension and no mass. There is tenderness ( diffuse). There is no rebound and no guarding.  Ileostomy bag in place, brown-yellow liquid in bag. Stoma-pink, non-bloody. Abdomen is soft, non-distended, mild diffuse tenderness.   Musculoskeletal: Normal range of motion.  Neurological: She is alert.  Skin: Skin is warm and dry. She is not diaphoretic.    ED Course  Procedures (including critical care time) Labs Review Labs Reviewed  CBC WITH DIFFERENTIAL - Abnormal; Notable for the following:    WBC 13.8 (*)    RBC 5.38 (*)    Hemoglobin 15.8 (*)    Platelets 402 (*)    Neutro Abs 10.1 (*)    All other components within normal limits  COMPREHENSIVE METABOLIC PANEL - Abnormal; Notable for the following:    Sodium 123 (*)    Chloride 89 (*)    CO2 13 (*)    Glucose, Bld 103 (*)    BUN 31 (*)    Creatinine, Ser 2.01 (*)    Total Protein 9.6 (*)    GFR calc non Af Amer 33 (*)    GFR calc Af Amer 39 (*)    Anion gap 21 (*)    All  other components within normal limits  URINALYSIS, ROUTINE W REFLEX MICROSCOPIC - Abnormal; Notable for the following:    APPearance CLOUDY (*)    Hgb urine dipstick TRACE (*)    All other components within normal limits  URINE MICROSCOPIC-ADD ON - Abnormal; Notable for the following:    Squamous Epithelial / LPF FEW (*)    Bacteria, UA FEW (*)    Casts HYALINE CASTS (*)  All other components within normal limits  CLOSTRIDIUM DIFFICILE BY PCR  STOOL CULTURE  LIPASE, BLOOD  POC URINE PREG, ED  I-STAT CG4 LACTIC ACID, ED    Imaging Review No results found.   EKG Interpretation None      MDM   Final diagnoses:  Acute renal failure, unspecified acute renal failure type  Hyponatremia  Non-intractable vomiting with nausea, vomiting of unspecified type    Pt is a 23yo female with hx of colon cancer dx June 2014, not on chemo or radiation presenting to ED with c/o feeling dehydrated with nausea and vomiting.  Pt appears acutely ill with generalized abdominal pain but soft abdomen, no rebound or guarding.  Afebrile. Mildly Hypotensive: 95/66. Labs: elevated WBC: 13.8, CMP significant for hyponatremia of 123, Cr 2.01  Pt has been given 2L of IV fluids and states she is feeling better.  Discussed pt with Dr. Kathrynn Humble who also examined pt, ordered lactic acid, C. Diff and stool culture.   Will consult hospitalist to admit for acute renal failure due to nausea and vomiting.   12:44 AM Consulted with Dr. Thereasa Solo who agreed to admit pt to a med-surg bed for acute renal failure and hyponatremia. Pt is stable at this time.     Noland Fordyce, PA-C 07/11/14 [phone]

## 2014-07-10 NOTE — ED Notes (Signed)
Unsuccessful lab stick. RN made aware.

## 2014-07-10 NOTE — ED Notes (Addendum)
Pt. Came in with complaint of dehydration, pt. Stated that she  Was vomiting since 0630 this morning and had 4 episodes, pt. Has ileostomy, claimed  of poor appetite. Pt. Has Colon cancer. Also claimed of weakness , denies pain.

## 2014-07-11 ENCOUNTER — Encounter (HOSPITAL_COMMUNITY): Payer: Self-pay

## 2014-07-11 DIAGNOSIS — E861 Hypovolemia: Secondary | ICD-10-CM | POA: Diagnosis present

## 2014-07-11 DIAGNOSIS — C189 Malignant neoplasm of colon, unspecified: Secondary | ICD-10-CM

## 2014-07-11 DIAGNOSIS — N179 Acute kidney failure, unspecified: Secondary | ICD-10-CM | POA: Diagnosis present

## 2014-07-11 DIAGNOSIS — E872 Acidosis, unspecified: Secondary | ICD-10-CM | POA: Diagnosis present

## 2014-07-11 DIAGNOSIS — D509 Iron deficiency anemia, unspecified: Secondary | ICD-10-CM | POA: Diagnosis present

## 2014-07-11 DIAGNOSIS — Z85038 Personal history of other malignant neoplasm of large intestine: Secondary | ICD-10-CM | POA: Diagnosis not present

## 2014-07-11 DIAGNOSIS — R112 Nausea with vomiting, unspecified: Secondary | ICD-10-CM | POA: Diagnosis present

## 2014-07-11 DIAGNOSIS — I959 Hypotension, unspecified: Secondary | ICD-10-CM | POA: Diagnosis present

## 2014-07-11 DIAGNOSIS — Z932 Ileostomy status: Secondary | ICD-10-CM | POA: Diagnosis not present

## 2014-07-11 DIAGNOSIS — Z9049 Acquired absence of other specified parts of digestive tract: Secondary | ICD-10-CM | POA: Diagnosis not present

## 2014-07-11 DIAGNOSIS — R197 Diarrhea, unspecified: Secondary | ICD-10-CM | POA: Diagnosis present

## 2014-07-11 LAB — CBC
HEMATOCRIT: 37.5 % (ref 36.0–46.0)
Hemoglobin: 12.8 g/dL (ref 12.0–15.0)
MCH: 28.5 pg (ref 26.0–34.0)
MCHC: 34.1 g/dL (ref 30.0–36.0)
MCV: 83.5 fL (ref 78.0–100.0)
Platelets: 306 10*3/uL (ref 150–400)
RBC: 4.49 MIL/uL (ref 3.87–5.11)
RDW: 12.7 % (ref 11.5–15.5)
WBC: 9.6 10*3/uL (ref 4.0–10.5)

## 2014-07-11 LAB — COMPREHENSIVE METABOLIC PANEL
ALBUMIN: 3.5 g/dL (ref 3.5–5.2)
ALT: 10 U/L (ref 0–35)
ANION GAP: 13 (ref 5–15)
AST: 16 U/L (ref 0–37)
Alkaline Phosphatase: 87 U/L (ref 39–117)
BUN: 25 mg/dL — AB (ref 6–23)
CO2: 16 mEq/L — ABNORMAL LOW (ref 19–32)
CREATININE: 1.52 mg/dL — AB (ref 0.50–1.10)
Calcium: 8.6 mg/dL (ref 8.4–10.5)
Chloride: 105 mEq/L (ref 96–112)
GFR calc Af Amer: 54 mL/min — ABNORMAL LOW (ref >90)
GFR calc non Af Amer: 47 mL/min — ABNORMAL LOW (ref >90)
Glucose, Bld: 96 mg/dL (ref 70–99)
Potassium: 3.5 mEq/L — ABNORMAL LOW (ref 3.7–5.3)
Sodium: 134 mEq/L — ABNORMAL LOW (ref 137–147)
Total Bilirubin: 0.5 mg/dL (ref 0.3–1.2)
Total Protein: 7 g/dL (ref 6.0–8.3)

## 2014-07-11 LAB — MAGNESIUM: Magnesium: 2.1 mg/dL (ref 1.5–2.5)

## 2014-07-11 LAB — CLOSTRIDIUM DIFFICILE BY PCR: CDIFFPCR: NEGATIVE

## 2014-07-11 LAB — MRSA PCR SCREENING: MRSA BY PCR: NEGATIVE

## 2014-07-11 LAB — PHOSPHORUS: PHOSPHORUS: 4.2 mg/dL (ref 2.3–4.6)

## 2014-07-11 LAB — I-STAT CG4 LACTIC ACID, ED: LACTIC ACID, VENOUS: 1.18 mmol/L (ref 0.5–2.2)

## 2014-07-11 MED ORDER — ACETAMINOPHEN 650 MG RE SUPP: 650.0000 mg | Freq: Four times a day (QID) | RECTAL | Status: DC | PRN

## 2014-07-11 MED ORDER — ADULT MULTIVITAMIN W/MINERALS CH
1.0000 | ORAL_TABLET | Freq: Every day | ORAL | Status: DC
  Administered 2014-07-11 – 2014-07-12 (×2): 1 via ORAL
  Filled 2014-07-11 (×2): qty 1

## 2014-07-11 MED ORDER — KCL IN DEXTROSE-NACL 20-5-0.9 MEQ/L-%-% IV SOLN
INTRAVENOUS | Status: DC
  Administered 2014-07-11: 04:00:00 via INTRAVENOUS
  Filled 2014-07-11 (×3): qty 1000

## 2014-07-11 MED ORDER — HEPARIN SODIUM (PORCINE) 5000 UNIT/ML IJ SOLN
5000.0000 [IU] | Freq: Three times a day (TID) | INTRAMUSCULAR | Status: DC
  Administered 2014-07-11 – 2014-07-12 (×4): 5000 [IU] via SUBCUTANEOUS
  Filled 2014-07-11 (×7): qty 1

## 2014-07-11 MED ORDER — OXYCODONE HCL 5 MG PO TABS: 5.0000 mg | ORAL_TABLET | ORAL | Status: DC | PRN

## 2014-07-11 MED ORDER — ALUM & MAG HYDROXIDE-SIMETH 200-200-20 MG/5ML PO SUSP: 30.0000 mL | Freq: Four times a day (QID) | ORAL | Status: DC | PRN

## 2014-07-11 MED ORDER — ONDANSETRON HCL 4 MG PO TABS: 4.0000 mg | ORAL_TABLET | Freq: Four times a day (QID) | ORAL | Status: DC | PRN

## 2014-07-11 MED ORDER — KCL IN DEXTROSE-NACL 20-5-0.9 MEQ/L-%-% IV SOLN
INTRAVENOUS | Status: AC
  Filled 2014-07-11: qty 1000

## 2014-07-11 MED ORDER — LOPERAMIDE HCL 2 MG PO CAPS: 2.0000 mg | ORAL_CAPSULE | ORAL | Status: DC | PRN

## 2014-07-11 MED ORDER — MORPHINE SULFATE 2 MG/ML IJ SOLN
1.0000 mg | INTRAMUSCULAR | Status: DC | PRN
Start: 2014-07-11 — End: 2014-07-12

## 2014-07-11 MED ORDER — ONDANSETRON HCL 4 MG/2ML IJ SOLN: 4.0000 mg | Freq: Four times a day (QID) | INTRAMUSCULAR | Status: DC | PRN

## 2014-07-11 MED ORDER — ACETAMINOPHEN 325 MG PO TABS: 650.0000 mg | ORAL_TABLET | Freq: Four times a day (QID) | ORAL | Status: DC | PRN

## 2014-07-11 MED ORDER — POTASSIUM CHLORIDE CRYS ER 20 MEQ PO TBCR
40.0000 meq | EXTENDED_RELEASE_TABLET | Freq: Once | ORAL | Status: AC
  Administered 2014-07-11: 40 meq via ORAL
  Filled 2014-07-11: qty 2

## 2014-07-11 NOTE — Progress Notes (Signed)
Patient ID: Cindy [name], female   DOB: 21-Jan-1991, 23 y.o.   MRN: [id number] [facility] PROGRESS NOTE  Cindy [name] [id number] DOB: [birth year] DOA: 07/10/2014 PCP: Vidal Schwalbe, MD  Brief narrative: Addendum to admission note done 07/11/2014. 23 y.o. female with history of colon cancer in the setting of familial adenomatous polyposis s/p colectomy with ileostomy 05/2013, iron deficiency anemia, and prior left third finger osteomyelitis who presented to [facility] ED 07/10/2014 with increased ostomy output, watery stools, nausea and occasional vomiting for about 1 day prior to this admission. C.diff by PCR was negative. In addition, she was found to have acute renal failure with creatinine of 2.01.   Assessment/Plan:    Principal Problem: Increased ostomy output, diarrhea, nausea and vomiting  Seems to be better this am. Pt reported normal stool this am in ostomy  C.diff negative.  No complaints of N/V so diet changed to regular this am  May continue supportive care with IV fluids (reduce the rate to 50 cc/hr instead of 125 cc/hr), antiemetics and analgesia as needed. Active Problems:   Hypokalemia  Secondary to GI losses  Repleted  Follow up BMP in am   Hyponatremia  Likely due to GI losses, dehydration  Sodium improved from 123 to 134 with IV fluids  Acute renal failure / metabolic acidosis   Secondary to dehydration, volume loss from diarrhea  Improving with IV fluids   Continue IV fluids for another 12 hours.   DVT Prophylaxis   Heparin subQ while pt is in hospital   Code Status: Full.  Family Communication:  plan of care discussed with the patient Disposition Plan: Home when stable.    IV Access:   Peripheral IV  Procedures and diagnostic studies:   No results found.  Medical Consultants:   None   Other Consultants:   None   Anti-Infectives:   None    Leisa Lenz, MD  [facility] Pager [phone]  If 7PM-7AM, please contact  night-coverage [email or web address] Password [facility] 07/11/2014, 12:50 PM   LOS: 1 day    HPI/Subjective: No acute overnight events.  Objective: Filed Vitals:   07/11/14 0015 07/11/14 0045 07/11/14 0144 07/11/14 0558  BP:   97/52 90/47  Pulse: 85 87 84 70  Temp:   97.8 F (36.6 C) 97.7 F (36.5 C)  TempSrc:   Oral Oral  Resp: 22 17 18 16   Height:   5\' 1"  (1.549 m)   Weight:   47.809 kg (105 lb 6.4 oz)   SpO2: 100% 99% 100% 94%    Intake/Output Summary (Last 24 hours) at 07/11/14 1250 Last data filed at 07/11/14 0800  Gross per 24 hour  Intake    240 ml  Output      0 ml  Net    240 ml    Exam:   General:  Pt is alert, follows commands appropriately, not in acute distress  Cardiovascular: Regular rate and rhythm, S1/S2, no murmurs  Respiratory: Clear to auscultation bilaterally, no wheezing, no crackles, no rhonchi  Abdomen: Soft, non tender, (+) ostomy; non distended, bowel sounds present  Extremities: No edema, pulses DP and PT palpable bilaterally  Neuro: Grossly nonfocal  Data Reviewed: Basic Metabolic Panel:  Recent Labs Lab 07/10/14 2150 07/11/14 0549  NA 123* 134*  K 3.7 3.5*  CL 89* 105  CO2 13* 16*  GLUCOSE 103* 96  BUN 31* 25*  CREATININE 2.01* 1.52*  CALCIUM 9.8 8.6  MG  --  2.1  PHOS  --  4.2   Liver Function Tests:  Recent Labs Lab 07/10/14 2150 07/11/14 0549  AST 28 16  ALT 14 10  ALKPHOS 115 87  BILITOT 0.6 0.5  PROT 9.6* 7.0  ALBUMIN 4.9 3.5    Recent Labs Lab 07/10/14 2150  LIPASE 44   No results found for this basename: AMMONIA,  in the last 168 hours CBC:  Recent Labs Lab 07/10/14 2150 07/11/14 0549  WBC 13.8* 9.6  NEUTROABS 10.1*  --   HGB 15.8* 12.8  HCT 44.9 37.5  MCV 83.5 83.5  PLT 402* 306   Cardiac Enzymes: No results found for this basename: CKTOTAL, CKMB, CKMBINDEX, TROPONINI,  in the last 168 hours BNP: No components found with this basename: POCBNP,  CBG: No results found for this basename:  GLUCAP,  in the last 168 hours  Recent Results (from the past 240 hour(s))  CLOSTRIDIUM DIFFICILE BY PCR     Status: None   Collection Time    07/11/14 12:11 AM      Result Value Ref Range Status   C difficile by pcr NEGATIVE  NEGATIVE Final   Comment: Performed at [facility]     Scheduled Meds: . heparin subcutaneous  5,000 Units Subcutaneous 3 times per day  . multivitamin with minerals  1 tablet Oral Daily   Continuous Infusions: . dextrose 5 % and 0.9 % NaCl with KCl 20 mEq/L 150 mL/hr at 07/11/14 0424

## 2014-07-11 NOTE — H&P (Addendum)
History and Physical  Cindy [name] [id number] DOB: 1991-04-05 DOA: 07/10/2014  Referring physician: Dirk Dress EDP PCP: Vidal Schwalbe, MD   Chief Complaint: high ostomy output  HPI: 23 y.o. female with history of colon cancer in the setting of familial adenomatous polyposis s/p colectomy with ileostomy 05/2013, iron deficiency anemia, and prior left third finger osteomyelitis who presented w/ c/o ~24hrs of high ostomy output, associated with nausea and some vomiting.  She normally has watery stools and empties her ostomy bag 5-6 times per day, but today her volume increased markedly, and with this she lost her appetite and developed nausea w/ vomiting.  She reports completing a course of cipro ~2 weeks ago for kidney stones.  She denies other sick contacts, fevers/chills, hematemesis, hematochezia, melena, or abdom pain/cramps.  She states she "knew I was getting dehydrated so I came on in."  Assessment/Plan  Acute kidney failure Baseline crt is 0.4 - clearly prerenal azotemia - hydrate and follow trend   Hypotension Due to hypovolemia - no evidence of shock - hydrate and follow   Nausea w/ vomiting  UA not convincing for UTI - urine preg test negative   Gap Metabolic acidosis Lactate normal - likely combination of chronic component due to ileostomy, as well as acute renal failure   Hyponatremia Due to hypovolemia - follow w/ hydration   Chronic Fe deficiency anemia  Hgb high at present due to marked DH  Colon cancer in remission  Code Status: FULL DVT Prophylaxis: SQ heparin  Family Communication: no family present at time of exam  Disposition Plan: admit to med/surg bed    Review of Systems:  HEENT:  No headaches, Difficulty swallowing,Tooth/dental problems,Sore throat,  No sneezing, itching, ear ache, nasal congestion, post nasal drip,  Cardio-vascular:  No chest pain, Orthopnea, PND, swelling in lower extremities, anasarca, dizziness, palpitations  Resp:  No shortness  of breath with exertion or at rest. No excess mucus, no productive cough, No non-productive cough, No coughing up of blood.No change in color of mucus.No wheezing.No chest wall deformity  Skin:  no rash or lesions.  GU:  no dysuria, change in color of urine, no urgency or frequency. No flank pain.  Musculoskeletal:  No joint pain or swelling. No decreased range of motion. No back pain.  Psych:  No change in mood or affect. No depression or anxiety. No memory loss.   Past Medical History  Diagnosis Date  . UTI (lower urinary tract infection)   . Heart murmur   . Pneumonia 2008; 2012  . Complication of anesthesia     "I swallowed the anesthesia when appendix taken out; got pneumonia"  . Iron deficiency anemia   . History of blood transfusion 04/26/2013    "4 bags" (05/21/2013)  . Colon cancer 2014    s/p resection and ileostomy  . 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 Leg reconstruction using fascial flap  ~ 2009  . Still born baby  03/22/13    delivered vaginally @ ~ 24 weeks  . Appendectomy  2008  . Colonoscopy N/A 05/24/2013    Procedure: COLONOSCOPY;  Surgeon: Missy Sabins, MD;  [place]: [place];  Service: Endoscopy;  Laterality: N/A;  . Colon surgery    . Ileostomy  06/26/2013  . I&d extremity Left 11/14/2013    Procedure: IRRIGATION AND DEBRIDEMENT LEFT LONG FINGER;  Surgeon: Tennis Must, MD;  Location: [facility]  [place];  Service: Orthopedics;  Laterality: Left;   Social History:  reports that she has never smoked. She has never used smokeless tobacco. She reports that she does not drink alcohol or use illicit drugs.  Allergies  Allergen Reactions  . 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 Other Hives and Itching    Dove soap    Family History  Problem Relation Age of Onset  . Adopted: Yes  . Alcohol abuse Neg Hx   . Arthritis Neg Hx   . Asthma Neg Hx   . Birth defects Neg Hx   .  Cancer Neg Hx   . COPD Neg Hx   . Depression Neg Hx   . Diabetes Neg Hx   . Drug abuse Neg Hx   . Early death Neg Hx   . Hearing loss Neg Hx   . Heart disease Neg Hx   . Hyperlipidemia Neg Hx   . Hypertension Neg Hx   . Kidney disease Neg Hx   . Learning disabilities Neg Hx   . Mental illness Neg Hx   . Mental retardation Neg Hx   . Miscarriages / Stillbirths Neg Hx   . Stroke Neg Hx   . Vision loss Neg Hx   . ALS Mother      Prior to Admission medications   Medication Sig Start Date End Date Taking? Authorizing Provider  EPINEPHrine 0.3 mg/0.3 mL IJ SOAJ injection Inject 0.3 mg into the muscle as needed (allergic reaction.). 08/10/13  Yes Joanell Rising, MD  Loperamide HCl (IMODIUM PO) Take 1 tablet by mouth as needed (diarrhea.).   Yes Historical Provider, MD  ondansetron (ZOFRAN ODT) 4 MG disintegrating tablet Take 1 tablet (4 mg total) by mouth every 8 (eight) hours as needed for nausea or vomiting. 06/27/14  Yes Heather Laisure, PA-C  oxyCODONE-acetaminophen (PERCOCET/ROXICET) 5-325 MG per tablet Take 1-2 tablets by mouth every 6 (six) hours as needed for severe pain. 06/27/14  Yes Hyman Bible, PA-C   Physical Exam: Filed Vitals:   07/10/14 2348 07/11/14 0000 07/11/14 0015 07/11/14 0045  BP: 96/58     Pulse: 88 92 85 87  Temp: 97.8 F (36.6 C)     TempSrc: Oral     Resp: 18 18 22 17   SpO2: 100% 100% 100% 99%    Wt Readings from Last 3 Encounters:  02/16/14 47.798 kg (105 lb 6 oz)  01/28/14 48.535 kg (107 lb)  01/19/14 48.535 kg (107 lb)    General: No acute respiratory distress - thin  HEENT: Normocephalic,atraumatic, pupils equal round reactive to light and accommodation, extraocular muscles intact bilaterally, OC/OP clear, sclera nonicteric Neck: No JVD or thyromegaly Lungs: Clear to auscultation bilaterally without wheezes or rhonchi, with good air movement throughout all fields Cardiovascular: Regular rate and rhythm without murmur gallop or rub normal S1  and S2 Abdomen: thin, large well healed midline scar, ostomy in R LQ with tan liquid stool, no mass, non-distended, no rebound  Extremities: No significant cyanosis, clubbing, edema bilateral lower extremities Neurologic: Alert and oriented x4, cranial nerves II through XII intact bilaterally, 5 over 5 strength bilateral upper and lower extremities   Labs on Admission:  Basic Metabolic Panel:  Recent Labs Lab 07/10/14 2150  NA 123*  K 3.7  CL 89*  CO2 13*  GLUCOSE 103*  BUN 31*  CREATININE 2.01*  CALCIUM 9.8   Liver Function Tests:  Recent Labs Lab 07/10/14 2150  AST 28  ALT 14  ALKPHOS  115  BILITOT 0.6  PROT 9.6*  ALBUMIN 4.9    Recent Labs Lab 07/10/14 2150  LIPASE 44   No results found for this basename: AMMONIA,  in the last 168 hours  CBC:  Recent Labs Lab 07/10/14 2150  WBC 13.8*  NEUTROABS 10.1*  HGB 15.8*  HCT 44.9  MCV 83.5  PLT 402*   Time spent: 70 minutes  Cherene Altes, MD [facility] For Consults/Admissions - Flow Manager - [phone] Office  [phone] Pager [phone]  On-Call/Text Page:      [email or web address]      password [facility]

## 2014-07-12 DIAGNOSIS — E860 Dehydration: Secondary | ICD-10-CM

## 2014-07-12 DIAGNOSIS — R112 Nausea with vomiting, unspecified: Secondary | ICD-10-CM

## 2014-07-12 LAB — BASIC METABOLIC PANEL
ANION GAP: 16 — AB (ref 5–15)
BUN: 16 mg/dL (ref 6–23)
CHLORIDE: 107 meq/L (ref 96–112)
CO2: 13 mEq/L — ABNORMAL LOW (ref 19–32)
Calcium: 9.6 mg/dL (ref 8.4–10.5)
Creatinine, Ser: 1.2 mg/dL — ABNORMAL HIGH (ref 0.50–1.10)
GFR calc non Af Amer: 62 mL/min — ABNORMAL LOW (ref >90)
GFR, EST AFRICAN AMERICAN: 72 mL/min — AB (ref >90)
Glucose, Bld: 89 mg/dL (ref 70–99)
POTASSIUM: 3.6 meq/L — AB (ref 3.7–5.3)
SODIUM: 136 meq/L — AB (ref 137–147)

## 2014-07-12 MED ORDER — LOPERAMIDE HCL 1 MG/7.5ML PO LIQD: 15.0000 mL | Freq: Four times a day (QID) | ORAL | Status: DC | PRN

## 2014-07-12 MED ORDER — POTASSIUM CHLORIDE ER 10 MEQ PO TBCR: EXTENDED_RELEASE_TABLET | ORAL | Status: DC

## 2014-07-12 MED ORDER — POTASSIUM CHLORIDE CRYS ER 20 MEQ PO TBCR
40.0000 meq | EXTENDED_RELEASE_TABLET | Freq: Once | ORAL | Status: AC
  Administered 2014-07-12: 40 meq via ORAL
  Filled 2014-07-12 (×2): qty 2

## 2014-07-12 NOTE — Discharge Instructions (Signed)
Dehydration, Adult Dehydration is when you lose more fluids from the body than you take in. Vital organs like the kidneys, brain, and heart cannot function without a proper amount of fluids and salt. Any loss of fluids from the body can cause dehydration.  CAUSES   Vomiting.  Diarrhea.  Excessive sweating.  Excessive urine output.  Fever. SYMPTOMS  Mild dehydration  Thirst.  Dry lips.  Slightly dry mouth. Moderate dehydration  Very dry mouth.  Sunken eyes.  Skin does not bounce back quickly when lightly pinched and released.  Dark urine and decreased urine production.  Decreased tear production.  Headache. Severe dehydration  Very dry mouth.  Extreme thirst.  Rapid, weak pulse (more than 100 beats per minute at rest).  Cold hands and feet.  Not able to sweat in spite of heat and temperature.  Rapid breathing.  Blue lips.  Confusion and lethargy.  Difficulty being awakened.  Minimal urine production.  No tears. DIAGNOSIS  Your caregiver will diagnose dehydration based on your symptoms and your exam. Blood and urine tests will help confirm the diagnosis. The diagnostic evaluation should also identify the cause of dehydration. TREATMENT  Treatment of mild or moderate dehydration can often be done at home by increasing the amount of fluids that you drink. It is best to drink small amounts of fluid more often. Drinking too much at one time can make vomiting worse. Refer to the home care instructions below. Severe dehydration needs to be treated at the hospital where you will probably be given intravenous (IV) fluids that contain water and electrolytes. HOME CARE INSTRUCTIONS   Ask your caregiver about specific rehydration instructions.  Drink enough fluids to keep your urine clear or pale yellow.  Drink small amounts frequently if you have nausea and vomiting.  Eat as you normally do.  Avoid:  Foods or drinks high in sugar.  Carbonated  drinks.  Juice.  Extremely hot or cold fluids.  Drinks with caffeine.  Fatty, greasy foods.  Alcohol.  Tobacco.  Overeating.  Gelatin desserts.  Wash your hands well to avoid spreading bacteria and viruses.  Only take over-the-counter or prescription medicines for pain, discomfort, or fever as directed by your caregiver.  Ask your caregiver if you should continue all prescribed and over-the-counter medicines.  Keep all follow-up appointments with your caregiver. SEEK MEDICAL CARE IF:  You have abdominal pain and it increases or stays in one area (localizes).  You have a rash, stiff neck, or severe headache.  You are irritable, sleepy, or difficult to awaken.  You are weak, dizzy, or extremely thirsty. SEEK IMMEDIATE MEDICAL CARE IF:   You are unable to keep fluids down or you get worse despite treatment.  You have frequent episodes of vomiting or diarrhea.  You have blood or green matter (bile) in your vomit.  You have blood in your stool or your stool looks black and tarry.  You have not urinated in 6 to 8 hours, or you have only urinated a small amount of very dark urine.  You have a fever.  You faint. MAKE SURE YOU:   Understand these instructions.  Will watch your condition.  Will get help right away if you are not doing well or get worse. Document Released: 11/13/2005 Document Revised: 02/05/2012 Document Reviewed: 07/03/2011 Exit[employer] Patient Information 2015 [employer]. This information is not intended to replace advice given to you by your health care provider. Make sure you discuss any questions you have with your health care   provider.  

## 2014-07-12 NOTE — Discharge Summary (Signed)
Physician Discharge Summary  Cindy [name] [id number] DOB: [birth year] DOA: 07/10/2014  PCP: Vidal Schwalbe, MD  Admit date: 07/10/2014 Discharge date: 07/12/2014   Recommendations for Outpatient Follow-Up:   1. F/U electrolytes in 1 week. F/U final stool culture results.   Discharge Diagnosis:   Principal Problem:    Acute renal failure in the setting of increased ostomy output, diarrhea and vomiting Active Problems:    Hypokalemia    Nausea with vomiting    Dehydration    Hyponatremia    Metabolic acidosis   Discharge Condition: Improved.  Diet recommendation: Regular.   History of Present Illness:   Cindy [name] is an 23 y.o. 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 C.diff by PCR was negative. In addition, she was found to have acute renal failure with creatinine of 2.01.  Hospital Course by Problem:   Principal Problem:  Acute renal failure in the setting of increased ostomy output, diarrhea, nausea and vomiting  Resolvrf with conservative therapy including IV fluids, anti-emetics.  C.diff negative.  Creatinine improved to baseline values. Baseline creatinine 1.05-1.35. D/C creatinine 1.20.  Active Problems:  Hypokalemia  Secondary to GI losses. Given additional 40 mEq of oral potassium prior to d/c and put on supplementation.  Hyponatremia  Likely due to GI losses, dehydration. Improved.    Medical Consultants:    None.   Discharge Exam:   Filed Vitals:   07/12/14 1205  BP: 96/51  Pulse: 88  Temp: 97.5 F (36.4 C)  Resp: 16   Filed Vitals:   07/11/14 0558 07/11/14 1409 07/11/14 2251 07/12/14 1205  BP: 90/47 95/53 97/54  96/51  Pulse: 70 95  78 88  Temp: 97.7 F (36.5 C) 98.5 F (36.9 C) 97.7 F (36.5 C) 97.5 F (36.4 C)  TempSrc: Oral Oral Oral Oral  Resp: 16 16 16 16   Height:      Weight:      SpO2: 94% 100% 100% 100%    Gen:  NAD Cardiovascular:  RRR, No M/R/G Respiratory: Lungs CTAB Gastrointestinal: Abdomen soft, NT/ND with normal active bowel sounds. Extremities: No C/E/C   The results of significant diagnostics from this hospitalization (including imaging, microbiology, ancillary and laboratory) are listed below for reference.     Procedures and Diagnostic Studies:    None  Labs:   Basic Metabolic Panel:  Recent Labs Lab 07/10/14 2150 07/11/14 0549 07/12/14 1012  NA 123* 134* 136*  K 3.7 3.5* 3.6*  CL 89* 105 107  CO2 13* 16* 13*  GLUCOSE 103* 96 89  BUN 31* 25* 16  CREATININE 2.01* 1.52* 1.20*  CALCIUM 9.8 8.6 9.6  MG  --  2.1  --   PHOS  --  4.2  --    GFR Estimated Creatinine Clearance: 54.1 ml/min (by C-G formula based on Cr of 1.2). Liver Function Tests:  Recent Labs Lab 07/10/14 2150 07/11/14 0549  AST 28 16  ALT 14 10  ALKPHOS 115 87  BILITOT 0.6 0.5  PROT 9.6* 7.0  ALBUMIN 4.9 3.5    Recent Labs Lab 07/10/14 2150  LIPASE 44   CBC:  Recent Labs Lab 07/10/14 2150 07/11/14 0549  WBC 13.8* 9.6  NEUTROABS 10.1*  --   HGB  15.8* 12.8  HCT 44.9 37.5  MCV 83.5 83.5  PLT 402* 306   Microbiology Recent Results (from the past 240 hour(s))  CLOSTRIDIUM DIFFICILE BY PCR     Status: None   Collection Time    07/11/14 12:11 AM      Result Value Ref Range Status   C difficile by pcr NEGATIVE  NEGATIVE Final   Comment: Performed at [facility]     Status: None   Collection Time    07/11/14 12:11 AM      Result Value Ref Range Status   Specimen Description STOOL   Final   Special Requests NONE   Final   Culture     Final   Value: NO SUSPICIOUS COLONIES, CONTINUING TO HOLD     Performed at [facility]   Report Status PENDING    Incomplete  MRSA PCR SCREENING     Status: None   Collection Time    07/11/14  5:10 PM      Result Value Ref Range Status   MRSA by PCR NEGATIVE  NEGATIVE Final   Comment:            The GeneXpert MRSA Assay (FDA     approved for NASAL specimens     only), is one component of a     comprehensive MRSA colonization     surveillance program. It is not     intended to diagnose MRSA     infection nor to guide or     monitor treatment for     MRSA infections.     Discharge Instructions:   Discharge Instructions   Call MD for:  persistant nausea and vomiting    Complete by:  As directed      Call MD for:    Complete by:  As directed   Ongoing high ostomy output.     Diet general    Complete by:  As directed      Discharge instructions    Complete by:  As directed   You were cared for by Dr. Jacquelynn Cree  (a hospitalist) during your hospital stay. If you have any questions about your discharge medications or the care you received while you were in the hospital after you are discharged, you can call the unit and ask to speak with the hospitalist on call if the hospitalist that took care of you is not available. Once you are discharged, your primary care physician will handle any further medical issues. Please note that NO REFILLS for any discharge medications will be authorized once you are discharged, as it is imperative that you return to your primary care physician (or establish a relationship with a primary care physician if you do not have one) for your aftercare needs so that they can reassess your need for medications and monitor your lab values.  Any outstanding tests can be reviewed by your PCP at your follow up visit.  It is also important to review any medicine changes with your PCP.  Please bring these d/c instructions with you to your next visit so your physician can review these changes with you.  If you do not have a primary care physician, you can call [phone] for a  physician referral.  It is highly recommended that you obtain a PCP for hospital follow up.     Increase activity slowly    Complete by:  As directed  Medication List    STOP taking these medications       IMODIUM PO  Replaced by:  Loperamide HCl 1 MG/7.5ML Liqd      TAKE these medications       EPINEPHrine 0.3 mg/0.3 mL Soaj injection  Commonly known as:  EPI-PEN  Inject 0.3 mg into the muscle as needed (allergic reaction.).     Loperamide HCl 1 MG/7.5ML Liqd  Commonly known as:  IMODIUM A-D  Take 15 mLs (2 mg total) by mouth every 6 (six) hours as needed.     ondansetron 4 MG disintegrating tablet  Commonly known as:  ZOFRAN ODT  Take 1 tablet (4 mg total) by mouth every 8 (eight) hours as needed for nausea or vomiting.     oxyCODONE-acetaminophen 5-325 MG per tablet  Commonly known as:  PERCOCET/ROXICET  Take 1-2 tablets by mouth every 6 (six) hours as needed for severe pain.     potassium chloride 10 MEQ tablet  Commonly known as:  K-DUR  Take 1-2 tabs daily as needed for high ostomy output.           Follow-up Information   Schedule an appointment as soon as possible for a visit with Vidal Schwalbe, MD. [facility] follow up, recheck electrolytes.)    Specialty:  Family Medicine   Contact information:   [address] [place] [address] [phone]        Time coordinating discharge: 25 minutes.  Signed:  RAMA,CHRISTINA  Pager [phone] [facility] 07/12/2014, 3:49 PM

## 2014-07-12 NOTE — ED Provider Notes (Signed)
Medical screening examination/treatment/procedure(s) were conducted as a shared visit with non-physician practitioner(s) and myself.  I personally evaluated the patient during the encounter.   EKG Interpretation None     Pt with abd pain, hx of colon ca, s/p ileostomy. Comes in with weakness, n/v. Pt tachycardic. Has AKI. Abd exam is non surgical, and patient states to me that the pain is she is having is typical of her pain with flareups and dehydration. Will not CT her in the ER, but patient will need admission.  Varney Biles, MD 07/12/14 [phone]

## 2014-07-14 LAB — STOOL CULTURE

## 2014-09-07 ENCOUNTER — Emergency Department (HOSPITAL_COMMUNITY): Payer: No Typology Code available for payment source

## 2014-09-07 ENCOUNTER — Observation Stay (HOSPITAL_COMMUNITY)
Admission: EM | Admit: 2014-09-07 | Discharge: 2014-09-09 | Disposition: A | Discharge status: Home / Self Care | Payer: No Typology Code available for payment source | Attending: Internal Medicine | Admitting: Internal Medicine

## 2014-09-07 ENCOUNTER — Encounter (HOSPITAL_COMMUNITY): Payer: Self-pay | Admitting: Emergency Medicine

## 2014-09-07 DIAGNOSIS — R42 Dizziness and giddiness: Secondary | ICD-10-CM | POA: Insufficient documentation

## 2014-09-07 DIAGNOSIS — Z3202 Encounter for pregnancy test, result negative: Secondary | ICD-10-CM | POA: Insufficient documentation

## 2014-09-07 DIAGNOSIS — D473 Essential (hemorrhagic) thrombocythemia: Secondary | ICD-10-CM | POA: Diagnosis present

## 2014-09-07 DIAGNOSIS — D509 Iron deficiency anemia, unspecified: Secondary | ICD-10-CM | POA: Diagnosis not present

## 2014-09-07 DIAGNOSIS — R531 Weakness: Secondary | ICD-10-CM | POA: Diagnosis not present

## 2014-09-07 DIAGNOSIS — R1115 Cyclical vomiting syndrome unrelated to migraine: Secondary | ICD-10-CM

## 2014-09-07 DIAGNOSIS — K635 Polyp of colon: Secondary | ICD-10-CM

## 2014-09-07 DIAGNOSIS — R011 Cardiac murmur, unspecified: Secondary | ICD-10-CM | POA: Diagnosis not present

## 2014-09-07 DIAGNOSIS — N390 Urinary tract infection, site not specified: Secondary | ICD-10-CM | POA: Diagnosis not present

## 2014-09-07 DIAGNOSIS — E876 Hypokalemia: Secondary | ICD-10-CM | POA: Diagnosis present

## 2014-09-07 DIAGNOSIS — E872 Acidosis, unspecified: Secondary | ICD-10-CM

## 2014-09-07 DIAGNOSIS — R197 Diarrhea, unspecified: Secondary | ICD-10-CM | POA: Insufficient documentation

## 2014-09-07 DIAGNOSIS — M869 Osteomyelitis, unspecified: Secondary | ICD-10-CM | POA: Insufficient documentation

## 2014-09-07 DIAGNOSIS — E860 Dehydration: Secondary | ICD-10-CM

## 2014-09-07 DIAGNOSIS — C189 Malignant neoplasm of colon, unspecified: Secondary | ICD-10-CM | POA: Insufficient documentation

## 2014-09-07 DIAGNOSIS — N179 Acute kidney failure, unspecified: Secondary | ICD-10-CM

## 2014-09-07 DIAGNOSIS — Z8701 Personal history of pneumonia (recurrent): Secondary | ICD-10-CM | POA: Insufficient documentation

## 2014-09-07 DIAGNOSIS — E871 Hypo-osmolality and hyponatremia: Secondary | ICD-10-CM

## 2014-09-07 DIAGNOSIS — R1110 Vomiting, unspecified: Secondary | ICD-10-CM

## 2014-09-07 DIAGNOSIS — D519 Vitamin B12 deficiency anemia, unspecified: Secondary | ICD-10-CM

## 2014-09-07 DIAGNOSIS — R112 Nausea with vomiting, unspecified: Secondary | ICD-10-CM | POA: Diagnosis present

## 2014-09-07 DIAGNOSIS — D75839 Thrombocytosis, unspecified: Secondary | ICD-10-CM | POA: Diagnosis present

## 2014-09-07 LAB — COMPREHENSIVE METABOLIC PANEL
ALBUMIN: 4.6 g/dL (ref 3.5–5.2)
ALK PHOS: 108 U/L (ref 39–117)
ALT: 15 U/L (ref 0–35)
AST: 19 U/L (ref 0–37)
Anion gap: 18 — ABNORMAL HIGH (ref 5–15)
BUN: 35 mg/dL — ABNORMAL HIGH (ref 6–23)
CHLORIDE: 93 meq/L — AB (ref 96–112)
CO2: 20 mEq/L (ref 19–32)
Calcium: 9 mg/dL (ref 8.4–10.5)
Creatinine, Ser: 1.91 mg/dL — ABNORMAL HIGH (ref 0.50–1.10)
GFR calc Af Amer: 41 mL/min — ABNORMAL LOW (ref >90)
GFR calc non Af Amer: 35 mL/min — ABNORMAL LOW (ref >90)
Glucose, Bld: 96 mg/dL (ref 70–99)
POTASSIUM: 3.5 meq/L — AB (ref 3.7–5.3)
Sodium: 131 mEq/L — ABNORMAL LOW (ref 137–147)
Total Bilirubin: 0.4 mg/dL (ref 0.3–1.2)
Total Protein: 9.1 g/dL — ABNORMAL HIGH (ref 6.0–8.3)

## 2014-09-07 LAB — CBC WITH DIFFERENTIAL/PLATELET
BASOS PCT: 0 % (ref 0–1)
Basophils Absolute: 0 10*3/uL (ref 0.0–0.1)
Eosinophils Absolute: 0.1 10*3/uL (ref 0.0–0.7)
Eosinophils Relative: 1 % (ref 0–5)
HCT: 41.4 % (ref 36.0–46.0)
HEMOGLOBIN: 14.3 g/dL (ref 12.0–15.0)
LYMPHS PCT: 23 % (ref 12–46)
Lymphs Abs: 2.6 10*3/uL (ref 0.7–4.0)
MCH: 28.4 pg (ref 26.0–34.0)
MCHC: 34.5 g/dL (ref 30.0–36.0)
MCV: 82.3 fL (ref 78.0–100.0)
MONOS PCT: 6 % (ref 3–12)
Monocytes Absolute: 0.7 10*3/uL (ref 0.1–1.0)
NEUTROS ABS: 8.2 10*3/uL — AB (ref 1.7–7.7)
NEUTROS PCT: 70 % (ref 43–77)
Platelets: 448 10*3/uL — ABNORMAL HIGH (ref 150–400)
RBC: 5.03 MIL/uL (ref 3.87–5.11)
RDW: 13.2 % (ref 11.5–15.5)
WBC: 11.6 10*3/uL — AB (ref 4.0–10.5)

## 2014-09-07 LAB — URINALYSIS, ROUTINE W REFLEX MICROSCOPIC
Bilirubin Urine: NEGATIVE
Glucose, UA: NEGATIVE mg/dL
Ketones, ur: NEGATIVE mg/dL
Nitrite: NEGATIVE
Protein, ur: 30 mg/dL — AB
SPECIFIC GRAVITY, URINE: 1.016 (ref 1.005–1.030)
Urobilinogen, UA: 0.2 mg/dL (ref 0.0–1.0)
pH: 5.5 (ref 5.0–8.0)

## 2014-09-07 LAB — URINE MICROSCOPIC-ADD ON

## 2014-09-07 LAB — I-STAT CG4 LACTIC ACID, ED: LACTIC ACID, VENOUS: 1.35 mmol/L (ref 0.5–2.2)

## 2014-09-07 LAB — POC URINE PREG, ED: Preg Test, Ur: NEGATIVE

## 2014-09-07 LAB — LIPASE, BLOOD: Lipase: 41 U/L (ref 11–59)

## 2014-09-07 MED ORDER — ONDANSETRON HCL 4 MG/2ML IJ SOLN: 4.0000 mg | Freq: Once | INTRAMUSCULAR | Status: DC

## 2014-09-07 MED ORDER — SODIUM CHLORIDE 0.9 % IV BOLUS (SEPSIS)
1000.0000 mL | Freq: Once | INTRAVENOUS | Status: AC
  Administered 2014-09-07: 1000 mL via INTRAVENOUS

## 2014-09-07 MED ORDER — DEXTROSE 5 % IV SOLN
1.0000 g | Freq: Once | INTRAVENOUS | Status: AC
  Administered 2014-09-07: 1 g via INTRAVENOUS
  Filled 2014-09-07: qty 10

## 2014-09-07 MED ORDER — ONDANSETRON HCL 4 MG/2ML IJ SOLN
4.0000 mg | Freq: Once | INTRAMUSCULAR | Status: AC
  Administered 2014-09-07: 4 mg via INTRAVENOUS
  Filled 2014-09-07: qty 2

## 2014-09-07 MED ORDER — ONDANSETRON 4 MG PO TBDP
4.0000 mg | ORAL_TABLET | Freq: Once | ORAL | Status: AC
  Administered 2014-09-07: 4 mg via ORAL
  Filled 2014-09-07: qty 1

## 2014-09-07 NOTE — ED Notes (Signed)
Bed: [id number] Expected date:  Expected time:  Means of arrival:  Comments: EMS-colon cancer/vomiting

## 2014-09-07 NOTE — ED Provider Notes (Signed)
CSN: [id number]     Arrival date & time 09/07/14  1700 History   First MD Initiated Contact with Patient 09/07/14 1732     Chief Complaint  Patient presents with  . Nausea  . Emesis  . Weakness  . Colon Cancer     (Consider location/radiation/quality/duration/timing/severity/associated sxs/prior Treatment) The history is provided by the patient. No language interpreter was used.  Cindy [name] is a 23 y/o F with PMHx of colon cancer recently diagnosed in 05/2013 with FAP followed by Dr. Cheryll Cockayne at [facility], iron deficiency, osteomyelitis of the left finger presenting to emergency department with abdominal pain, nausea, vomiting. Patient reported that the abdominal pain started yesterday and localized to lower portion of her abdomen bilaterally described as a dull aching sensation that is constant with radiation towards her lower back. Stated that she's been having nausea and vomiting that started shortly after the abdominal pain-reported that she's been unable to keep any food or fluids down. Reported approximately 6-7 episodes of emesis today-NB/NB. Stated that she recently had surgery performed for an abscess that was drained in September 2015. Denied fever, chills, neck pain, neck stiffness, dizziness, chest pain, shortness of breath, difficulty breathing, blurred vision, sudden loss of vision, melena, hematochezia, sick contacts. Patient not currently on chemo or radiation.  PCP Dr. Dema Severin Oncologist Dr. Cheryll Cockayne  Past Medical History  Diagnosis Date  . UTI (lower urinary tract infection)   . Heart murmur   . Pneumonia 2008; 2012  . Complication of anesthesia     "I swallowed the anesthesia when appendix taken out; got pneumonia"  . Iron deficiency anemia   . History of blood transfusion 04/26/2013    "4 bags" (05/21/2013)  . Colon cancer 2014    s/p resection and ileostomy  . Osteomyelitis of finger of left hand 10/2013    3rd finger   Past Surgical  History  Procedure Laterality Date  . Leg reconstruction using fascial flap  ~ 2009  . Still born baby  [birth year]    delivered vaginally @ ~ 24 weeks  . Appendectomy  2008  . Colonoscopy N/A 05/24/2013    Procedure: COLONOSCOPY;  Surgeon: Missy Sabins, MD;  Location: [facility];  Service: Endoscopy;  Laterality: N/A;  . Colon surgery    . Ileostomy  06/26/2013  . I&d extremity Left 11/14/2013    Procedure: IRRIGATION AND DEBRIDEMENT LEFT LONG FINGER;  Surgeon: Tennis Must, MD;  Location: [facility];  Service: Orthopedics;  Laterality: Left;   Family History  Problem Relation Age of Onset  . Adopted: Yes  . Alcohol abuse Neg Hx   . Arthritis Neg Hx   . Asthma Neg Hx   . Birth defects Neg Hx   . Cancer Neg Hx   . COPD Neg Hx   . Depression Neg Hx   . Diabetes Neg Hx   . Drug abuse Neg Hx   . Early death Neg Hx   . Hearing loss Neg Hx   . Heart disease Neg Hx   . Hyperlipidemia Neg Hx   . Hypertension Neg Hx   . Kidney disease Neg Hx   . Learning disabilities Neg Hx   . Mental illness Neg Hx   . Mental retardation Neg Hx   . Miscarriages / Stillbirths Neg Hx   . Stroke Neg Hx   . Vision loss Neg Hx   . ALS Mother    History  Substance Use Topics  .  Smoking status: Never Smoker   . Smokeless tobacco: Never Used  . Alcohol Use: No   OB History   Grav Para Term Preterm Abortions TAB SAB Ect Mult Living   1 1  1            Review of Systems  Constitutional: Negative for fever and chills.  Respiratory: Negative for shortness of breath.   Cardiovascular: Negative for chest pain.  Gastrointestinal: Positive for nausea, vomiting, abdominal pain and diarrhea. Negative for constipation, blood in stool and anal bleeding.  Genitourinary: Negative for vaginal pain.  Musculoskeletal: Positive for back pain. Negative for neck pain and neck stiffness.  Neurological: Positive for light-headedness. Negative for dizziness.      Allergies  Orange fruit and  Other  Home Medications   Prior to Admission medications   Medication Sig Start Date End Date Taking? Authorizing Provider  EPINEPHrine 0.3 mg/0.3 mL IJ SOAJ injection Inject 0.3 mg into the muscle as needed (allergic reaction.). 08/10/13  Yes Joanell Rising, MD  Loperamide HCl (IMODIUM A-D) 1 MG/7.5ML LIQD Take 15 mLs (2 mg total) by mouth every 6 (six) hours as needed. 07/12/14  Yes Venetia Maxon Rama, MD  oxyCODONE-acetaminophen (PERCOCET/ROXICET) 5-325 MG per tablet Take 1-2 tablets by mouth every 6 (six) hours as needed for severe pain. 06/27/14  Yes Heather Laisure, PA-C  potassium chloride (K-DUR) 10 MEQ tablet Take 1-2 tabs daily as needed for high ostomy output. 07/12/14  Yes Christina P Rama, MD   BP 104/58  Pulse 97  Temp(Src) 98 F (36.7 C) (Oral)  Resp 18  Ht 5\' 1"  (1.549 m)  Wt 104 lb (47.174 kg)  BMI 19.66 kg/m2  SpO2 100% Physical Exam  Nursing note and vitals reviewed. Constitutional: She is oriented to person, place, and time. She appears well-developed and well-nourished. No distress.  HENT:  Head: Normocephalic and atraumatic.  Mouth/Throat: Oropharynx is clear and moist. No oropharyngeal exudate.  Eyes: Conjunctivae and EOM are normal. Pupils are equal, round, and reactive to light. Right eye exhibits no discharge. Left eye exhibits no discharge.  Neck: Normal range of motion. Neck supple. No tracheal deviation present.  Cardiovascular: Normal rate, regular rhythm and normal heart sounds.  Exam reveals no friction rub.   No murmur heard. Pulmonary/Chest: Effort normal and breath sounds normal. No respiratory distress. She has no wheezes. She has no rales.  Abdominal: Soft. Bowel sounds are normal. She exhibits no distension. There is generalized tenderness. There is no rebound and no guarding.  Negative abdominal distention Bowel sounds normoactive in all 4 quadrants Colostomy bag noted in the right lower quadrants with negative inflammation, drainage, leakage.  Stoma pink. Loose yellow/brownish liquid in bag.  Abdomen soft upon palpation Diffuse tenderness upon palpation to the abdomen Negative peritoneal signs  Musculoskeletal: Normal range of motion.  Full ROM to upper and lower extremities without difficulty noted, negative ataxia noted.  Lymphadenopathy:    She has no cervical adenopathy.  Neurological: She is alert and oriented to person, place, and time. No cranial nerve deficit. She exhibits normal muscle tone. Coordination normal.  Cranial nerves III-XII grossly intact Strength 5+/5+ to upper and lower extremities bilaterally with resistance applied, equal distribution noted Equal grip strength bilaterally Negative arm drift  Skin: Skin is warm and dry. No rash noted. She is not diaphoretic. No erythema.  Psychiatric: She has a normal mood and affect. Her behavior is normal. Thought content normal.    ED Course  Procedures (including critical care time)  10:37 PM This provider reassessed the patient, patient reported that she's feeling mildly better-reported that nausea has been controlled. Patient reported that she would like to try by mouth.  11:21 PM This provider reassessed the patient-patient reported that she was able to tolerate fluids by mouth, reported that this increased her abdominal pain and neck she feels nauseous.   11:46 PM This provider spoke with Dr. Jannet Mantis, [facility], on-call oncologist. Will update her Oncologist. Reported that her symptoms are due to UTI/pyelonephritis.   12:39 AM This provider spoke with Dr. Dillon Bjork - discussed case, labs, imaging, vitals in great detail. Patient to be admitted to [place].   Results for orders placed during the hospital encounter of 09/07/14  CBC WITH DIFFERENTIAL      Result Value Ref Range   WBC 11.6 (*) 4.0 - 10.5 K/uL   RBC 5.03  3.87 - 5.11 MIL/uL   Hemoglobin 14.3  12.0 - 15.0 g/dL   HCT 41.4  36.0 - 46.0 %   MCV 82.3  78.0 - 100.0 fL   MCH  28.4  26.0 - 34.0 pg   MCHC 34.5  30.0 - 36.0 g/dL   RDW 13.2  11.5 - 15.5 %   Platelets 448 (*) 150 - 400 K/uL   Neutrophils Relative % 70  43 - 77 %   Neutro Abs 8.2 (*) 1.7 - 7.7 K/uL   Lymphocytes Relative 23  12 - 46 %   Lymphs Abs 2.6  0.7 - 4.0 K/uL   Monocytes Relative 6  3 - 12 %   Monocytes Absolute 0.7  0.1 - 1.0 K/uL   Eosinophils Relative 1  0 - 5 %   Eosinophils Absolute 0.1  0.0 - 0.7 K/uL   Basophils Relative 0  0 - 1 %   Basophils Absolute 0.0  0.0 - 0.1 K/uL  COMPREHENSIVE METABOLIC PANEL      Result Value Ref Range   Sodium 131 (*) 137 - 147 mEq/L   Potassium 3.5 (*) 3.7 - 5.3 mEq/L   Chloride 93 (*) 96 - 112 mEq/L   CO2 20  19 - 32 mEq/L   Glucose, Bld 96  70 - 99 mg/dL   BUN 35 (*) 6 - 23 mg/dL   Creatinine, Ser 1.91 (*) 0.50 - 1.10 mg/dL   Calcium 9.0  8.4 - 10.5 mg/dL   Total Protein 9.1 (*) 6.0 - 8.3 g/dL   Albumin 4.6  3.5 - 5.2 g/dL   AST 19  0 - 37 U/L   ALT 15  0 - 35 U/L   Alkaline Phosphatase 108  39 - 117 U/L   Total Bilirubin 0.4  0.3 - 1.2 mg/dL   GFR calc non Af Amer 35 (*) >90 mL/min   GFR calc Af Amer 41 (*) >90 mL/min   Anion gap 18 (*) 5 - 15  LIPASE, BLOOD      Result Value Ref Range   Lipase 41  11 - 59 U/L  URINALYSIS, ROUTINE W REFLEX MICROSCOPIC      Result Value Ref Range   Color, Urine YELLOW  YELLOW   APPearance CLOUDY (*) CLEAR   Specific Gravity, Urine 1.016  1.005 - 1.030   pH 5.5  5.0 - 8.0   Glucose, UA NEGATIVE  NEGATIVE mg/dL   Hgb urine dipstick MODERATE (*) NEGATIVE   Bilirubin Urine NEGATIVE  NEGATIVE   Ketones, ur NEGATIVE  NEGATIVE mg/dL   Protein, ur 30 (*)  NEGATIVE mg/dL   Urobilinogen, UA 0.2  0.0 - 1.0 mg/dL   Nitrite NEGATIVE  NEGATIVE   Leukocytes, UA LARGE (*) NEGATIVE  URINE MICROSCOPIC-ADD ON      Result Value Ref Range   Squamous Epithelial / LPF RARE  RARE   WBC, UA TOO NUMEROUS TO COUNT  <3 WBC/hpf   Bacteria, UA FEW (*) RARE  POC URINE PREG, ED      Result Value Ref Range   Preg Test, Ur  NEGATIVE  NEGATIVE  I-STAT CG4 LACTIC ACID, ED      Result Value Ref Range   Lactic Acid, Venous 1.35  0.5 - 2.2 mmol/L  ' Labs Review Labs Reviewed  CBC WITH DIFFERENTIAL - Abnormal; Notable for the following:    WBC 11.6 (*)    Platelets 448 (*)    Neutro Abs 8.2 (*)    All other components within normal limits  COMPREHENSIVE METABOLIC PANEL - Abnormal; Notable for the following:    Sodium 131 (*)    Potassium 3.5 (*)    Chloride 93 (*)    BUN 35 (*)    Creatinine, Ser 1.91 (*)    Total Protein 9.1 (*)    GFR calc non Af Amer 35 (*)    GFR calc Af Amer 41 (*)    Anion gap 18 (*)    All other components within normal limits  URINALYSIS, ROUTINE W REFLEX MICROSCOPIC - Abnormal; Notable for the following:    APPearance CLOUDY (*)    Hgb urine dipstick MODERATE (*)    Protein, ur 30 (*)    Leukocytes, UA LARGE (*)    All other components within normal limits  URINE MICROSCOPIC-ADD ON - Abnormal; Notable for the following:    Bacteria, UA FEW (*)    All other components within normal limits  URINE CULTURE  LIPASE, BLOOD  POC URINE PREG, ED  I-STAT CG4 LACTIC ACID, ED    Imaging Review Ct Abdomen Pelvis Wo Contrast  09/07/2014   CLINICAL DATA:  Epigastric abdominal pain with nausea and vomiting. History of colon cancer, currently in remission. Initial encounter.  EXAM: CT ABDOMEN AND PELVIS WITHOUT CONTRAST  TECHNIQUE: Multidetector CT imaging of the abdomen and pelvis was performed following the standard protocol without IV contrast.  COMPARISON:  CT abdomen pelvis - 06/27/2014  FINDINGS: The lack of intravenous contrast limits the ability to evaluate solid abdominal organs.  The previously identified partially obstructing stone within the left UVJ has been passed in the interval. Residual bilateral nonobstructing renal stones are grossly unchanged with dominant right-sided renal stone again measuring approximately 0.4 cm in diameter (image 32, series 2) and dominant left-sided  renal stones measuring approximately 0.3 cm (images 29 and 33). No renal stones are seen along the expected course of either ureter or the urinary bladder. Normal noncontrast appearance of the urinary bladder given degree distention. Several phleboliths overlie the lower pelvis bilaterally. No urinary obstruction or perinephric stranding. Normal noncontrast appearance of the bilateral adrenal glands, pancreas and spleen.  Normal hepatic contour. There is a minimal amount of focal fatty infiltration adjacent to the fissure for ligamentum teres. Normal noncontrast appearance of the gallbladder. No definite radiopaque gallstones.  The patient has a right lower quadrant ostomy. There are no dilated loops of upstream bowel to suggest enteric obstruction. No pneumoperitoneum, pneumatosis or portal venous gas.  Normal caliber the abdominal aorta. No bulky retroperitoneal, mesenteric, pelvic or inguinal lymphadenopathy on this noncontrast examination. There is a  minimal amount of physiologic fluid within the endometrial canal. No discrete adnexal lesions on this noncontrast examination. No free fluid in the pelvic cul-de-sac.  Limited visualization of lower thorax is negative for focal airspace opacity or pleural effusion.  No acute or aggressive osseus abnormalities.  Regional soft tissues appear normal.  IMPRESSION: 1. Interval passage of previously identified partially obstructing left UVJ stone. 2. Residual bilateral nonobstructing renal stones as above. No evidence urinary obstruction. 3. Unchanged appearance of right lower quadrant ostomy without evidence of enteric obstruction.   Electronically Signed   By: Sandi Mariscal M.D.   On: 09/07/2014 21:31     EKG Interpretation None      MDM   Final diagnoses:  UTI (lower urinary tract infection)  Intractable vomiting with nausea, vomiting of unspecified type  Colon cancer    Medications  sodium chloride 0.9 % bolus 1,000 mL (0 mLs Intravenous Stopped  09/07/14 1958)  ondansetron (ZOFRAN) injection 4 mg (4 mg Intravenous Given 09/07/14 1959)  sodium chloride 0.9 % bolus 1,000 mL (0 mLs Intravenous Stopped 09/08/14 0026)  cefTRIAXone (ROCEPHIN) 1 g in dextrose 5 % 50 mL IVPB (0 g Intravenous Stopped 09/07/14 2323)  ondansetron (ZOFRAN-ODT) disintegrating tablet 4 mg (4 mg Oral Given 09/07/14 2324)   Filed Vitals:   09/07/14 1755 09/07/14 1956 09/07/14 2130 09/07/14 2200  BP: 93/55 101/57 102/58 104/58  Pulse: 94 91 85 97  Temp: 97.7 F (36.5 C) 98 F (36.7 C)    TempSrc: Oral Oral    Resp: 16 18    Height: 5\' 1"  (1.549 m)     Weight: 104 lb (47.174 kg)     SpO2: 100% 100% 100% 100%   This provider reviewed patient's chart. Patient is followed by [facility] for oncology regarding FAP colon cancer that was recently diagnosed in July of 2014. Patient had surgery performed on 06/24/2013 regarding his subtotal colectomy with end ileostomy and resection of the en bloc small bowel. Patient recently had drainage of perianal abscess versus possible fistula on 08/13/2014 performed by Dr. Cheryll Cockayne. CBC noted mild elevated white blood cell count of 11.6. Hemoglobin 14.3, crit 41.4. Mildly low sodium of 131, low potassium of 3.5. BUN and creatinine mildly elevated-BUN 35, creatinine 1.91. Lipase negative elevation. Lactic acid negative elevation. Urine pregnancy negative. Urinalysis noted moderate hemoglobin with large leukocytes-white blood cell count too numerous to count. Urine culture pending. CT abdomen and pelvis with contrast noted interval passage of previously identified partially obstructing left UVJ stone, residual bilateral nonobstructing renal stone-no evidence of urinary obstruction. Unchanged appearance of right lower quadrant ostomy with out evidence of enteric obstruction. Negative findings of obstruction noted on CT. Negative findings a fluid collection or acute abscess noted on CT given the limited without contrast.  Patient presenting to emergency department with UTI. Patient given IV fluids, antiemetics and antibiotics. Symptoms have not improved. Patient to be admitted regarding UTI and unresolved nausea and vomiting. Patient admitted for observation. Patient stable for transfer.   Jamse Mead, PA-C 09/08/14 [phone]

## 2014-09-07 NOTE — ED Notes (Signed)
Per [facility]- Pt presents with NAD- Pt has HX of colon cancer in remission. Ileostomy bag. Zofran 4mg  and 300cc NS in route. Pt seen at [facility] for presenting complaint. Pt is follow closely at [facility]. Seen here because of travel. Presents with dehydration symptoms. Generalized tenderness to stomach. Denies fever

## 2014-09-08 DIAGNOSIS — R112 Nausea with vomiting, unspecified: Secondary | ICD-10-CM | POA: Diagnosis present

## 2014-09-08 DIAGNOSIS — G43A1 Cyclical vomiting, intractable: Secondary | ICD-10-CM

## 2014-09-08 DIAGNOSIS — E872 Acidosis: Secondary | ICD-10-CM

## 2014-09-08 DIAGNOSIS — E876 Hypokalemia: Secondary | ICD-10-CM

## 2014-09-08 DIAGNOSIS — D473 Essential (hemorrhagic) thrombocythemia: Secondary | ICD-10-CM

## 2014-09-08 DIAGNOSIS — R1110 Vomiting, unspecified: Secondary | ICD-10-CM

## 2014-09-08 DIAGNOSIS — E871 Hypo-osmolality and hyponatremia: Secondary | ICD-10-CM

## 2014-09-08 LAB — CBC
HCT: 34.5 % — ABNORMAL LOW (ref 36.0–46.0)
HEMOGLOBIN: 11.4 g/dL — AB (ref 12.0–15.0)
MCH: 27.7 pg (ref 26.0–34.0)
MCHC: 33 g/dL (ref 30.0–36.0)
MCV: 83.7 fL (ref 78.0–100.0)
PLATELETS: 318 10*3/uL (ref 150–400)
RBC: 4.12 MIL/uL (ref 3.87–5.11)
RDW: 13.3 % (ref 11.5–15.5)
WBC: 9.2 10*3/uL (ref 4.0–10.5)

## 2014-09-08 LAB — COMPREHENSIVE METABOLIC PANEL
ALK PHOS: 82 U/L (ref 39–117)
ALT: 12 U/L (ref 0–35)
ANION GAP: 13 (ref 5–15)
AST: 14 U/L (ref 0–37)
Albumin: 3.4 g/dL — ABNORMAL LOW (ref 3.5–5.2)
BUN: 24 mg/dL — AB (ref 6–23)
CO2: 18 meq/L — AB (ref 19–32)
Calcium: 8.5 mg/dL (ref 8.4–10.5)
Chloride: 106 mEq/L (ref 96–112)
Creatinine, Ser: 1.38 mg/dL — ABNORMAL HIGH (ref 0.50–1.10)
GFR, EST AFRICAN AMERICAN: 61 mL/min — AB (ref >90)
GFR, EST NON AFRICAN AMERICAN: 53 mL/min — AB (ref >90)
Glucose, Bld: 100 mg/dL — ABNORMAL HIGH (ref 70–99)
POTASSIUM: 3.3 meq/L — AB (ref 3.7–5.3)
Sodium: 137 mEq/L (ref 137–147)
Total Bilirubin: 0.4 mg/dL (ref 0.3–1.2)
Total Protein: 6.8 g/dL (ref 6.0–8.3)

## 2014-09-08 LAB — MRSA PCR SCREENING: MRSA by PCR: NEGATIVE

## 2014-09-08 MED ORDER — SODIUM CHLORIDE 0.9 % IV SOLN
INTRAVENOUS | Status: DC
  Administered 2014-09-08: 06:00:00 via INTRAVENOUS

## 2014-09-08 MED ORDER — OXYCODONE HCL 5 MG/5ML PO SOLN: 5.0000 mg | ORAL | Status: DC | PRN

## 2014-09-08 MED ORDER — SODIUM CHLORIDE 0.9 % IV BOLUS (SEPSIS)
1000.0000 mL | Freq: Once | INTRAVENOUS | Status: AC
  Administered 2014-09-08: 1000 mL via INTRAVENOUS

## 2014-09-08 MED ORDER — HEPARIN SODIUM (PORCINE) 5000 UNIT/ML IJ SOLN
5000.0000 [IU] | Freq: Three times a day (TID) | INTRAMUSCULAR | Status: DC
  Administered 2014-09-08 – 2014-09-09 (×4): 5000 [IU] via SUBCUTANEOUS
  Filled 2014-09-08 (×7): qty 1

## 2014-09-08 MED ORDER — POTASSIUM CHLORIDE ER 10 MEQ PO TBCR
10.0000 meq | EXTENDED_RELEASE_TABLET | Freq: Every day | ORAL | Status: DC
  Administered 2014-09-08 – 2014-09-09 (×2): 10 meq via ORAL
  Filled 2014-09-08 (×2): qty 1

## 2014-09-08 MED ORDER — BOOST PLUS PO LIQD
237.0000 mL | Freq: Two times a day (BID) | ORAL | Status: DC
  Administered 2014-09-08: 237 mL via ORAL
  Filled 2014-09-08 (×3): qty 237

## 2014-09-08 MED ORDER — SULFAMETHOXAZOLE-TRIMETHOPRIM 200-40 MG/5ML PO SUSP
20.0000 mL | Freq: Two times a day (BID) | ORAL | Status: DC
  Administered 2014-09-08 – 2014-09-09 (×3): 20 mL via ORAL
  Filled 2014-09-08 (×4): qty 20

## 2014-09-08 MED ORDER — POTASSIUM CHLORIDE IN NACL 40-0.9 MEQ/L-% IV SOLN
INTRAVENOUS | Status: DC
  Administered 2014-09-08 – 2014-09-09 (×2): 100 mL/h via INTRAVENOUS
  Filled 2014-09-08 (×5): qty 1000

## 2014-09-08 NOTE — H&P (Signed)
[facility] History and Physical  [name] [id number] DOB: July 03, 1991 DOA: 09/07/2014  Referring physician: [facility] PCP: Vidal Schwalbe, MD  Specialists: none  Chief Complaint: nausea and abd pain  Assessment/Plan Active Problems: UTI Colon Cancer Acute Kidney injury Nephrolithiasis Metabolic Acidosis  Nausea vomiting and dehydration. Likely from UTI. May be from viral gastro. Urine PReg neg. WBC 11.6, lactic acid 1.35, Cr 1.91. Acidosis likely secondary to AKI and GI losses. CTX given in ED - Admit for Obs (Pt unable to tolerate PO adn w/ significant other co-morbidities) - IVF NS 19ml/hr - CMET in am - Bactrim - f/u UCX - zofran  AKI: Cr 1.92 on admission. Recent nephrolithiasis w/ passing of stone. CT showing current  non-obstructing stones. Last nml Cr 1.05 ion 05/13/14. Multiple recent elevated readings, pt may be trending towards CKD though recent ED visits for UTI and renal stones.  - IVF as above - BMET in am - Consider Renal consult if not improving or urology if stones become obstructive  Colon Cancer: s/p resection w/ colostomy. Healthy appearing ostomy - routine ostomy care  DVT Prophylaxis: Hep Aceitunas TID  Code Status: FULL Family Communication: none Disposition Plan: pending improvement  HPI: Cindy [name] is a 23 y.o. female came to [facility] ed 09/08/2014 with  Nausea and vomiting. Started yesterday. Getting worse. Pt went to PCPs office who had EMS bring pt to ED. Has not tried anything at home to resolve symptoms. Associated w/ lower abd pain and frequency. Denies Fevers, dysuria, CP, SOB. Reports normal output from ostomy during this time (output has been more loose but non-bloody). Empties ostomy 3-4 x daily. Unsure of sick contacts.   Review of Systems: Per HPI w/ all other systems negative.   Past Medical History  Diagnosis Date  . UTI (lower urinary tract infection)   . Heart murmur   . Pneumonia 2008; 2012  .  Complication of anesthesia     "I swallowed the anesthesia when appendix taken out; got pneumonia"  . Iron deficiency anemia   . History of blood transfusion 04/26/2013    "4 bags" (05/21/2013)  . Colon cancer 2014    s/p resection and ileostomy  . 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 Leg reconstruction using fascial flap  ~ 2009  . Still born baby  03/22/13    delivered vaginally @ ~ 24 weeks  . Appendectomy  2008  . Colonoscopy N/A 05/24/2013    Procedure: COLONOSCOPY;  Surgeon: Missy Sabins, MD;  Location: [place];  Service: Endoscopy;  Laterality: N/A;  . Colon surgery    . Ileostomy  06/26/2013  . I&d extremity Left 11/14/2013    Procedure: IRRIGATION AND DEBRIDEMENT LEFT LONG FINGER;  Surgeon: Tennis Must, MD;  Location: [place];  Service: Orthopedics;  Laterality: Left;   Social History:  History   Social History Narrative   Worker's comp.  Previously worked at [employer]  . 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 Other Hives and Itching    Dove soap    Family History  Problem Relation Age of Onset  . Adopted: Yes  . Alcohol abuse Neg Hx   . Arthritis Neg Hx   . Asthma Neg Hx   . Birth defects Neg Hx   . Cancer Neg Hx   . COPD Neg Hx   .  Depression Neg Hx   . Diabetes Neg Hx   . Drug abuse Neg Hx   . Early death Neg Hx   . Hearing loss Neg Hx   . Heart disease Neg Hx   . Hyperlipidemia Neg Hx   . Hypertension Neg Hx   . Kidney disease Neg Hx   . Learning disabilities Neg Hx   . Mental illness Neg Hx   . Mental retardation Neg Hx   . Miscarriages / Stillbirths Neg Hx   . Stroke Neg Hx   . Vision loss Neg Hx   . ALS Mother       Prior to Admission medications   Medication Sig Start Date End Date Taking? Authorizing Provider  EPINEPHrine 0.3 mg/0.3 mL IJ SOAJ injection Inject 0.3 mg into the muscle as needed (allergic reaction.).  08/10/13  Yes Joanell Rising, MD  Loperamide HCl (IMODIUM A-D) 1 MG/7.5ML LIQD Take 15 mLs (2 mg total) by mouth every 6 (six) hours as needed. 07/12/14  Yes Venetia Maxon Rama, MD  oxyCODONE-acetaminophen (PERCOCET/ROXICET) 5-325 MG per tablet Take 1-2 tablets by mouth every 6 (six) hours as needed for severe pain. 06/27/14  Yes Heather Laisure, PA-C  potassium chloride (K-DUR) 10 MEQ tablet Take 1-2 tabs daily as needed for high ostomy output. 07/12/14  Yes Venetia Maxon Rama, MD   Physical Exam: Filed Vitals:   09/07/14 1755 09/07/14 1956 09/07/14 2130 09/07/14 2200  BP: 93/55 101/57 102/58 104/58  Pulse: 94 91 85 97  Temp: 97.7 F (36.5 C) 98 F (36.7 C)    TempSrc: Oral Oral    Resp: 16 18    Height: 5\' 1"  (1.549 m)     Weight: 47.174 kg (104 lb)     SpO2: 100% 100% 100% 100%     General:  Sleeping, NAD  Eyes: PERRL EOMI   ENT: dry mm,  Neck: FROM  Cardiovascular: RRR, no m/r/g  Respiratory: Nml WOB. No wheezes, ronchi. Good breath sounds throughout  Abdomen: R lower quadrant ostomy. Pink and not retracted NABS, suprapubic ttp  Skin: warm, well perfused, intact  Musculoskeletal: No LE edema, moves all extremities spontaneously   Psychiatric: nml affect  Neurologic: CN2-12 Grossly intact, cerebellar fxn nml  Labs on Admission:  Basic Metabolic Panel:  Recent Labs Lab 09/07/14 1924  NA 131*  K 3.5*  CL 93*  CO2 20  GLUCOSE 96  BUN 35*  CREATININE 1.91*  CALCIUM 9.0   Liver Function Tests:  Recent Labs Lab 09/07/14 1924  AST 19  ALT 15  ALKPHOS 108  BILITOT 0.4  PROT 9.1*  ALBUMIN 4.6    Recent Labs Lab 09/07/14 1924  LIPASE 41   No results found for this basename: AMMONIA,  in the last 168 hours CBC:  Recent Labs Lab 09/07/14 1924  WBC 11.6*  NEUTROABS 8.2*  HGB 14.3  HCT 41.4  MCV 82.3  PLT 448*   Cardiac Enzymes: No results found for this basename: CKTOTAL, CKMB, CKMBINDEX, TROPONINI,  in the last 168 hours  BNP (last 3  results)  Recent Labs  05/13/14 1205  PROBNP 71.8   CBG: No results found for this basename: GLUCAP,  in the last 168 hours  Radiological Exams on Admission: Ct Abdomen Pelvis Wo Contrast  09/07/2014   CLINICAL DATA:  Epigastric abdominal pain with nausea and vomiting. History of colon cancer, currently in remission. Initial encounter.  EXAM: CT ABDOMEN AND PELVIS WITHOUT CONTRAST  TECHNIQUE: Multidetector CT imaging of the  abdomen and pelvis was performed following the standard protocol without IV contrast.  COMPARISON:  CT abdomen pelvis - 06/27/2014  FINDINGS: The lack of intravenous contrast limits the ability to evaluate solid abdominal organs.  The previously identified partially obstructing stone within the left UVJ has been passed in the interval. Residual bilateral nonobstructing renal stones are grossly unchanged with dominant right-sided renal stone again measuring approximately 0.4 cm in diameter (image 32, series 2) and dominant left-sided renal stones measuring approximately 0.3 cm (images 29 and 33). No renal stones are seen along the expected course of either ureter or the urinary bladder. Normal noncontrast appearance of the urinary bladder given degree distention. Several phleboliths overlie the lower pelvis bilaterally. No urinary obstruction or perinephric stranding. Normal noncontrast appearance of the bilateral adrenal glands, pancreas and spleen.  Normal hepatic contour. There is a minimal amount of focal fatty infiltration adjacent to the fissure for ligamentum teres. Normal noncontrast appearance of the gallbladder. No definite radiopaque gallstones.  The patient has a right lower quadrant ostomy. There are no dilated loops of upstream bowel to suggest enteric obstruction. No pneumoperitoneum, pneumatosis or portal venous gas.  Normal caliber the abdominal aorta. No bulky retroperitoneal, mesenteric, pelvic or inguinal lymphadenopathy on this noncontrast examination. There is a  minimal amount of physiologic fluid within the endometrial canal. No discrete adnexal lesions on this noncontrast examination. No free fluid in the pelvic cul-de-sac.  Limited visualization of lower thorax is negative for focal airspace opacity or pleural effusion.  No acute or aggressive osseus abnormalities.  Regional soft tissues appear normal.  IMPRESSION: 1. Interval passage of previously identified partially obstructing left UVJ stone. 2. Residual bilateral nonobstructing renal stones as above. No evidence urinary obstruction. 3. Unchanged appearance of right lower quadrant ostomy without evidence of enteric obstruction.   Electronically Signed   By: Sandi Mariscal M.D.   On: 09/07/2014 21:31         MERRELL, Grayling Congress, MD [facility] [email or web address] Password [email or web address] 09/08/2014, 12:42 AM

## 2014-09-08 NOTE — ED Notes (Signed)
Patient transferred to 1329.

## 2014-09-08 NOTE — Progress Notes (Signed)
UR completed 

## 2014-09-08 NOTE — ED Notes (Signed)
Telephone report to Ailene Ravel, Therapist, sports for room 1329.

## 2014-09-08 NOTE — Care Management Note (Signed)
CARE MANAGEMENT NOTE 09/08/2014  Patient:  Cindy [name], Cindy [name]   Account Number:  [id number]  Date Initiated:  09/08/2014  Documentation initiated by:  Marney Doctor  Subjective/Objective Assessment:   23 yo female admitted with intractable nausea and vomitting.  Hx of colon ca     Action/Plan:   From home with parent   Anticipated DC Date:  09/10/2014   Anticipated DC Plan:  [place]  CM consult      Choice offered to / List presented to:             Status of service:  In process, will continue to follow Medicare Important Message given?   (If response is "NO", the following Medicare IM given date fields will be blank) Date Medicare IM given:   Medicare IM given by:   Date Additional Medicare IM given:   Additional Medicare IM given by:    Discharge Disposition:    Per UR Regulation:  Reviewed for med. necessity/level of care/duration of stay  If discussed at [place] of Stay Meetings, dates discussed:    Comments:  09/08/14 Marney Doctor RN,BSN,NCM [id number] CM following for DC needs.  Pt has PCP per MD note.  Will assist with DC needs as needed.

## 2014-09-08 NOTE — Progress Notes (Signed)
Progress Note   Cindy [name] [id number] DOB: [birth year] DOA: 09/07/2014 PCP: Vidal Schwalbe, MD   Brief Narrative:   Cindy [name] is an 23 y.o. female with a PMH of colon cancer status post colectomy/ileostomy 05/2013, familial adenomatous polyposis, prior acute renal failure in the setting of increased ostomy output with hospitalization 06/2014 who was admitted 09/07/14 with a chief complaint of nausea and abdominal pain. Upon initial evaluation, the patient was found to have acute kidney injury with a creatinine of 1.91, hypokalemia, and mild leukocytosis.  Assessment/Plan:   Principal Problem:   Intractable nausea and vomiting / dehydration / acute renal failure / metabolic acidosis / hyponatremia / hypokalemia  Continue IV fluids. Renal function improved. Baseline creatinine 1.05-1.35.  Add potassium to IV fluids.  Hyponatremia likely secondary to dehydration, resolved.  Urinalysis with mixed results (negative nitrites, but large amount of leukocytes), followup cultures to rule out UTI as a source of her symptoms. On empiric Bactrim.  Advance diet.  Active Problems:    Adenocarcinoma of colon  Status post colectomy/ileostomy with no evidence of metastatic disease.    Thrombocytosis  Likely reactive, resolved with IV fluids.    Anemia, iron deficiency/Vitamin B12 deficiency anemia  Drop in hemoglobin likely dilutional.    DVT Prophylaxis  Continue subcutaneous heparin.  Code Status: Full. Family Communication: No family at bedside. Disposition Plan: Home when stable.   IV Access:    Peripheral IV   Procedures and diagnostic studies:   Ct Abdomen Pelvis Wo Contrast 09/07/2014 : 1. Interval passage of previously identified partially obstructing left UVJ stone. 2. Residual bilateral nonobstructing renal stones as above. No evidence urinary obstruction. 3. Unchanged appearance of right lower quadrant ostomy without evidence of enteric obstruction.       Medical Consultants:    None.   Other Consultants:    None.   Anti-Infectives:    Bactrim 09/07/14--->  Subjective:   Cindy [name] reports that she has not had any abdominal pain, nausea or vomiting so far today.  Tolerating a liquid diet.    Objective:    Filed Vitals:   09/08/14 0128 09/08/14 0400 09/08/14 0422 09/08/14 0618  BP: 93/52  78/42 91/43  Pulse: 89  81 95  Temp:   98.3 F (36.8 C) 97.5 F (36.4 C)  TempSrc:   Oral Oral  Resp:   18 16  Height:  5\' 1"  (1.549 m)    Weight:  47.854 kg (105 lb 8 oz)    SpO2: 98%  100% 100%    Intake/Output Summary (Last 24 hours) at 09/08/14 0805 Last data filed at 09/08/14 0557  Gross per 24 hour  Intake   1030 ml  Output      0 ml  Net   1030 ml    Exam: Gen:  NAD Cardiovascular:  RRR, No M/R/G Respiratory:  Lungs CTAB Gastrointestinal:  Abdomen soft, NT/ND, + BS, ostomy with liquid stools. Extremities:  No C/E/C   Data Reviewed:    Labs: Basic Metabolic Panel:  Recent Labs Lab 09/07/14 1924 09/08/14 0427  NA 131* 137  K 3.5* 3.3*  CL 93* 106  CO2 20 18*  GLUCOSE 96 100*  BUN 35* 24*  CREATININE 1.91* 1.38*  CALCIUM 9.0 8.5   GFR Estimated Creatinine Clearance: 47 ml/min (by C-G formula based on Cr of 1.38). Liver Function Tests:  Recent Labs Lab 09/07/14 1924 09/08/14 0427  AST 19 14  ALT 15 12  ALKPHOS 108  82  BILITOT 0.4 0.4  PROT 9.1* 6.8  ALBUMIN 4.6 3.4*    Recent Labs Lab 09/07/14 1924  LIPASE 41   CBC:  Recent Labs Lab 09/07/14 1924 09/08/14 0427  WBC 11.6* 9.2  NEUTROABS 8.2*  --   HGB 14.3 11.4*  HCT 41.4 34.5*  MCV 82.3 83.7  PLT 448* 318   BNP (last 3 results)  Recent Labs  05/13/14 1205  PROBNP 71.8   Sepsis Labs:  Recent Labs Lab 09/07/14 1849 09/07/14 1924 09/08/14 0427  WBC  --  11.6* 9.2  LATICACIDVEN 1.35  --   --    Microbiology Recent Results (from the past 240 hour(s))  MRSA PCR SCREENING     Status: None    Collection Time    09/08/14  4:16 AM      Result Value Ref Range Status   MRSA by PCR NEGATIVE  NEGATIVE Final   Comment:            The GeneXpert MRSA Assay (FDA     approved for NASAL specimens     only), is one component of a     comprehensive MRSA colonization     surveillance program. It is not     intended to diagnose MRSA     infection nor to guide or     monitor treatment for     MRSA infections.     Medications:   . heparin  5,000 Units Subcutaneous 3 times per day  . potassium chloride  10 mEq Oral Daily  . sulfamethoxazole-trimethoprim  20 mL Oral Q12H   Continuous Infusions: . sodium chloride 125 mL/hr at 09/08/14 0557    Time spent: 25 minutes.   LOS: 1 day   RAMA,CHRISTINA  [facility] Pager [phone]. If unable to reach me by pager, please call my cell phone at [phone].  *Please refer to [email or web address], password [email or web address] to get updated schedule on who will round on this patient, as hospitalists switch teams weekly. If 7PM-7AM, please contact night-coverage at [email or web address], password [email or web address] for any overnight needs.  09/08/2014, 8:05 AM

## 2014-09-08 NOTE — Progress Notes (Signed)
INITIAL NUTRITION ASSESSMENT  DOCUMENTATION CODES Per approved criteria  -Not Applicable   INTERVENTION: - Boost Plus BID - RD will continue to follow for nutrition care plan.  NUTRITION DIAGNOSIS: Inadequate oral intake related to nausea and abdominal pain as evidenced by reported weight loss.   Goal: Pt to meet >/= 90% of their estimated nutrition needs   Monitor:  Weight trend, po intake, acceptance of supplements, labs  Reason for Assessment: MST  23 y.o. female  Admitting Dx: Intractable nausea and vomiting  ASSESSMENT: 23 y.o. female came to [facility] ed 09/08/2014 with  Nausea and vomiting. Started yesterday. Getting worse. Pt went to PCPs office who had EMS bring pt to ED. Has not tried anything at home to resolve symptoms. Associated w/ lower abd pain and frequency. Denies Fevers, dysuria, CP, SOB. Reports normal output from ostomy during this time (output has been more loose but non-bloody). Empties ostomy 3-4 x daily. Unsure of sick contacts.  - Pt with history of colon cancer. S/p resection and ileostomy in 2014.  - Spoke with pt who reports a poor appetite since Sunday. She said that she has lost "a couple of pounds," but says that she currently feels hungry. Diet has been upgraded to soft diet.  - Pt with no signs of fat or muscle wasting.   Labs: CBGs: 94-103 Na, K low BUN elevated  Height: Ht Readings from Last 1 Encounters:  09/08/14 5\' 1"  (1.549 m)    Weight: Wt Readings from Last 1 Encounters:  09/08/14 105 lb 8 oz (47.854 kg)    Ideal Body Weight: 47.8 kg  % Ideal Body Weight: 100%  Wt Readings from Last 10 Encounters:  09/08/14 105 lb 8 oz (47.854 kg)  07/11/14 105 lb 6.4 oz (47.809 kg)  02/16/14 105 lb 6 oz (47.798 kg)  01/28/14 107 lb (48.535 kg)  01/19/14 107 lb (48.535 kg)  12/25/13 97 lb (43.999 kg)  11/18/13 99 lb (44.906 kg)  11/14/13 96 lb (43.545 kg)  11/14/13 96 lb (43.545 kg)  09/23/13 93 lb 4.1 oz (42.3 kg)    Usual Body  Weight: 105 lbs  % Usual Body Weight: 100%  BMI:  Body mass index is 19.94 kg/(m^2).  Estimated Nutritional Needs: Kcal: 1300-1500 Protein: 70-80g Fluid: >1.5 L/day  Skin: intact  Diet Order: Criss Rosales  EDUCATION NEEDS: -Education needs addressed   Intake/Output Summary (Last 24 hours) at 09/08/14 1148 Last data filed at 09/08/14 0900  Gross per 24 hour  Intake   1030 ml  Output      0 ml  Net   1030 ml    Last BM: pt has iliostomy which she empties 3-4 x daily   Labs:   Recent Labs Lab 09/07/14 1924 09/08/14 0427  NA 131* 137  K 3.5* 3.3*  CL 93* 106  CO2 20 18*  BUN 35* 24*  CREATININE 1.91* 1.38*  CALCIUM 9.0 8.5  GLUCOSE 96 100*    CBG (last 3)  No results found for this basename: GLUCAP,  in the last 72 hours  Scheduled Meds: . heparin  5,000 Units Subcutaneous 3 times per day  . potassium chloride  10 mEq Oral Daily  . sulfamethoxazole-trimethoprim  20 mL Oral Q12H    Continuous Infusions: . 0.9 % NaCl with KCl 40 mEq / L 100 mL/hr (09/08/14 0845)    Past Medical History  Diagnosis Date  . UTI (lower urinary tract infection)   . Heart murmur   . Pneumonia 2008; 2012  .  Complication of anesthesia     "I swallowed the anesthesia when appendix taken out; got pneumonia"  . Iron deficiency anemia   . History of blood transfusion 04/26/2013    "4 bags" (05/21/2013)  . Colon cancer 2014    s/p resection and ileostomy  . 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 Leg reconstruction using fascial flap  ~ 2009  . Still born baby  03/22/13    delivered vaginally @ ~ 24 weeks  . Appendectomy  2008  . Colonoscopy N/A 05/24/2013    Procedure: COLONOSCOPY;  Surgeon: Missy Sabins, MD;  Location: [place];  Service: Endoscopy;  Laterality: N/A;  . Colon surgery    . Ileostomy  06/26/2013  . I&d extremity Left 11/14/2013    Procedure: IRRIGATION AND DEBRIDEMENT LEFT LONG FINGER;   Surgeon: Tennis Must, MD;  Location: [place];  Service: Orthopedics;  Laterality: Left;    Laurette Schimke RD, LDN

## 2014-09-09 DIAGNOSIS — C189 Malignant neoplasm of colon, unspecified: Secondary | ICD-10-CM

## 2014-09-09 DIAGNOSIS — N179 Acute kidney failure, unspecified: Secondary | ICD-10-CM

## 2014-09-09 DIAGNOSIS — N390 Urinary tract infection, site not specified: Secondary | ICD-10-CM

## 2014-09-09 DIAGNOSIS — E860 Dehydration: Secondary | ICD-10-CM

## 2014-09-09 LAB — BASIC METABOLIC PANEL
Anion gap: 13 (ref 5–15)
BUN: 11 mg/dL (ref 6–23)
CHLORIDE: 113 meq/L — AB (ref 96–112)
CO2: 16 mEq/L — ABNORMAL LOW (ref 19–32)
CREATININE: 1.05 mg/dL (ref 0.50–1.10)
Calcium: 8.3 mg/dL — ABNORMAL LOW (ref 8.4–10.5)
GFR calc Af Amer: 85 mL/min — ABNORMAL LOW (ref >90)
GFR calc non Af Amer: 73 mL/min — ABNORMAL LOW (ref >90)
GLUCOSE: 97 mg/dL (ref 70–99)
POTASSIUM: 3.9 meq/L (ref 3.7–5.3)
Sodium: 142 mEq/L (ref 137–147)

## 2014-09-09 MED ORDER — AMOXICILLIN 400 MG/5ML PO SUSR: 400.0000 mg | Freq: Two times a day (BID) | ORAL | Status: DC

## 2014-09-09 MED ORDER — NITROFURANTOIN MONOHYD MACRO 100 MG PO CAPS: 100.0000 mg | ORAL_CAPSULE | Freq: Two times a day (BID) | ORAL | Status: DC

## 2014-09-09 MED ORDER — BOOST PLUS PO LIQD: 237.0000 mL | Freq: Two times a day (BID) | ORAL | Status: DC

## 2014-09-09 MED ORDER — AMOXICILLIN 400 MG/5ML PO SUSR
400.0000 mg | Freq: Two times a day (BID) | ORAL | Status: DC
Start: 2014-09-09 — End: 2014-10-02

## 2014-09-09 NOTE — Discharge Instructions (Signed)

## 2014-09-09 NOTE — Discharge Summary (Signed)
Physician Discharge Summary  Cindy [name] [id number] DOB: 12-08-1990 DOA: 09/07/2014  PCP: Vidal Schwalbe, MD  Admit date: 09/07/2014 Discharge date: 09/09/2014  Recommendations for Outpatient Follow-up:  1. Pt will take amoxicillin on discharge for treatment of enterococcus UTI.  Discharge Diagnoses:  Principal Problem:   Intractable nausea and vomiting Active Problems:   Hypokalemia   Nausea with vomiting   Adenocarcinoma of colon   Acute renal failure   Dehydration   Hyponatremia   Thrombocytosis   Metabolic acidosis   Anemia, iron deficiency   Vitamin B12 deficiency anemia    Discharge Condition: stable   Diet recommendation: as tolerated   History of present illness:  23 y.o. female with a PMH of colon cancer status post colectomy/ileostomy 05/2013, familial adenomatous polyposis, prior acute renal failure in the setting of increased ostomy output with hospitalization 06/2014 who was admitted 09/07/14 with a chief complaint of nausea and abdominal pain. Upon initial evaluation, the patient was found to have acute kidney injury with a creatinine of 1.91, hypokalemia, and mild leukocytosis.   Assessment/Plan:   Principal Problem:  Intractable nausea and vomiting / dehydration / acute renal failure / metabolic acidosis / hyponatremia / hypokalemia  Renal function improving. Baseline creatinine 1.05-1.35.    Hyponatremia likely secondary to dehydration, resolved with IV fluids. Potassium WNL. Was supplemented through IV fluids. Was on empiric Bactrim. Urine culture grew enterococcus species. Pt will be discharged on amoxicillin as prescribed.   Active Problems:  Adenocarcinoma of colon  Status post colectomy/ileostomy with no evidence of metastatic disease. Thrombocytosis  Likely reactive, resolved with IV fluids. Anemia, iron deficiency/Vitamin B12 deficiency anemia  Drop in hemoglobin likely dilutional. DVT Prophylaxis  Continue subcutaneous heparin while pt  in hospital.   Code Status: Full.  Family Communication: No family at bedside.   IV Access:   Peripheral IV Procedures and diagnostic studies:   Ct Abdomen Pelvis Wo Contrast 09/07/2014 : 1. Interval passage of previously identified partially obstructing left UVJ stone. 2. Residual bilateral nonobstructing renal stones as above. No evidence urinary obstruction. 3. Unchanged appearance of right lower quadrant ostomy without evidence of enteric obstruction.  Medical Consultants:   None. Other Consultants:   None. Anti-Infectives:   Bactrim 09/07/14---> 09/09/2014  Signed:  Leisa Lenz, MD  [facility] 09/09/2014, 11:06 AM  Pager #: [phone]   Discharge Exam: Filed Vitals:   09/09/14 0603  BP: 97/46  Pulse: 71  Temp: 98.1 F (36.7 C)  Resp: 16   Filed Vitals:   09/08/14 0618 09/08/14 1323 09/08/14 2218 09/09/14 0603  BP: 91/43 100/59 101/47 97/46  Pulse: 95 83 83 71  Temp: 97.5 F (36.4 C) 97.8 F (36.6 C) 97.7 F (36.5 C) 98.1 F (36.7 C)  TempSrc: Oral Oral Oral Oral  Resp: 16 16 16 16   Height:      Weight:      SpO2: 100% 100% 100% 100%    General: Pt is alert, follows commands appropriately, not in acute distress Cardiovascular: Regular rate and rhythm, S1/S2 +, no murmurs Respiratory: Clear to auscultation bilaterally, no wheezing, no crackles, no rhonchi Abdominal: Soft, non tender, non distended, bowel sounds +, no guarding; has colostomy  Extremities: no edema, no cyanosis, pulses palpable bilaterally DP and PT Neuro: Grossly nonfocal  Discharge Instructions  Discharge Instructions   Call MD for:  difficulty breathing, headache or visual disturbances    Complete by:  As directed      Call MD for:  persistant dizziness or light-headedness  Complete by:  As directed      Call MD for:  persistant nausea and vomiting    Complete by:  As directed      Call MD for:  severe uncontrolled pain    Complete by:  As directed      Diet - low  sodium heart healthy    Complete by:  As directed      Discharge instructions    Complete by:  As directed   Continue Macrobid for 5 more days on discharge.     Increase activity slowly    Complete by:  As directed             Medication List         amoxicillin 400 MG/5ML suspension  Commonly known as:  AMOXIL  Take 5 mLs (400 mg total) by mouth 2 (two) times daily.     EPINEPHrine 0.3 mg/0.3 mL Soaj injection  Commonly known as:  EPI-PEN  Inject 0.3 mg into the muscle as needed (allergic reaction.).     lactose free nutrition Liqd  Take 237 mLs by mouth 2 (two) times daily between meals.     Loperamide HCl 1 MG/7.5ML Liqd  Commonly known as:  IMODIUM A-D  Take 15 mLs (2 mg total) by mouth every 6 (six) hours as needed.     oxyCODONE-acetaminophen 5-325 MG per tablet  Commonly known as:  PERCOCET/ROXICET  Take 1-2 tablets by mouth every 6 (six) hours as needed for severe pain.     potassium chloride 10 MEQ tablet  Commonly known as:  K-DUR  Take 1-2 tabs daily as needed for high ostomy output.              Follow-up Information   Follow up with Vidal Schwalbe, MD. Schedule an appointment as soon as possible for a visit in 1 week. (Follow up appt after recent hospitalization)    Specialty:  Family Medicine   Contact information:   [address] [place] [address] [phone]        The results of significant diagnostics from this hospitalization (including imaging, microbiology, ancillary and laboratory) are listed below for reference.    Significant Diagnostic Studies: Ct Abdomen Pelvis Wo Contrast  09/07/2014   CLINICAL DATA:  Epigastric abdominal pain with nausea and vomiting. History of colon cancer, currently in remission. Initial encounter.  EXAM: CT ABDOMEN AND PELVIS WITHOUT CONTRAST  TECHNIQUE: Multidetector CT imaging of the abdomen and pelvis was performed following the standard protocol without IV contrast.  COMPARISON:  CT  abdomen pelvis - 06/27/2014  FINDINGS: The lack of intravenous contrast limits the ability to evaluate solid abdominal organs.  The previously identified partially obstructing stone within the left UVJ has been passed in the interval. Residual bilateral nonobstructing renal stones are grossly unchanged with dominant right-sided renal stone again measuring approximately 0.4 cm in diameter (image 32, series 2) and dominant left-sided renal stones measuring approximately 0.3 cm (images 29 and 33). No renal stones are seen along the expected course of either ureter or the urinary bladder. Normal noncontrast appearance of the urinary bladder given degree distention. Several phleboliths overlie the lower pelvis bilaterally. No urinary obstruction or perinephric stranding. Normal noncontrast appearance of the bilateral adrenal glands, pancreas and spleen.  Normal hepatic contour. There is a minimal amount of focal fatty infiltration adjacent to the fissure for ligamentum teres. Normal noncontrast appearance of the gallbladder. No definite radiopaque gallstones.  The patient has a  right lower quadrant ostomy. There are no dilated loops of upstream bowel to suggest enteric obstruction. No pneumoperitoneum, pneumatosis or portal venous gas.  Normal caliber the abdominal aorta. No bulky retroperitoneal, mesenteric, pelvic or inguinal lymphadenopathy on this noncontrast examination. There is a minimal amount of physiologic fluid within the endometrial canal. No discrete adnexal lesions on this noncontrast examination. No free fluid in the pelvic cul-de-sac.  Limited visualization of lower thorax is negative for focal airspace opacity or pleural effusion.  No acute or aggressive osseus abnormalities.  Regional soft tissues appear normal.  IMPRESSION: 1. Interval passage of previously identified partially obstructing left UVJ stone. 2. Residual bilateral nonobstructing renal stones as above. No evidence urinary obstruction. 3.  Unchanged appearance of right lower quadrant ostomy without evidence of enteric obstruction.   Electronically Signed   By: Sandi Mariscal M.D.   On: 09/07/2014 21:31    Microbiology: Recent Results (from the past 240 hour(s))  URINE CULTURE     Status: None   Collection Time    09/07/14  9:48 PM      Result Value Ref Range Status   Specimen Description URINE, RANDOM   Final   Special Requests Normal   Final   Culture  Setup Time     Final   Value: 09/08/2014 05:33     Performed at [facility]     Final   Value: >=100,000 COLONIES/ML     Performed at [facility]   Culture     Final   Value: ENTEROCOCCUS SPECIES     Performed at [facility]   Report Status PENDING   Incomplete  MRSA PCR SCREENING     Status: None   Collection Time    09/08/14  4:16 AM      Result Value Ref Range Status   MRSA by PCR NEGATIVE  NEGATIVE Final   Comment:            The GeneXpert MRSA Assay (FDA     approved for NASAL specimens     only), is one component of a     comprehensive MRSA colonization     surveillance program. It is not     intended to diagnose MRSA     infection nor to guide or     monitor treatment for     MRSA infections.     Labs: Basic Metabolic Panel:  Recent Labs Lab 09/07/14 1924 09/08/14 0427 09/09/14 0400  NA 131* 137 142  K 3.5* 3.3* 3.9  CL 93* 106 113*  CO2 20 18* 16*  GLUCOSE 96 100* 97  BUN 35* 24* 11  CREATININE 1.91* 1.38* 1.05  CALCIUM 9.0 8.5 8.3*   Liver Function Tests:  Recent Labs Lab 09/07/14 1924 09/08/14 0427  AST 19 14  ALT 15 12  ALKPHOS 108 82  BILITOT 0.4 0.4  PROT 9.1* 6.8  ALBUMIN 4.6 3.4*    Recent Labs Lab 09/07/14 1924  LIPASE 41   No results found for this basename: AMMONIA,  in the last 168 hours CBC:  Recent Labs Lab 09/07/14 1924 09/08/14 0427  WBC 11.6* 9.2  NEUTROABS 8.2*  --   HGB 14.3 11.4*  HCT 41.4 34.5*  MCV 82.3 83.7  PLT 448* 318   Cardiac Enzymes: No  results found for this basename: CKTOTAL, CKMB, CKMBINDEX, TROPONINI,  in the last 168 hours BNP: BNP (last 3 results)  Recent Labs  05/13/14 1205  PROBNP  71.8   CBG: No results found for this basename: GLUCAP,  in the last 168 hours  Time coordinating discharge: Over 30 minutes

## 2014-09-09 NOTE — Progress Notes (Signed)
Pt discharged to home via private vehicle.  Reviewed discharge and medications with patient with no further questions.  Dr. Charlies Silvers stated home medications have been called into pharmacy on [facility].  Claudette Stapler, RN

## 2014-09-10 LAB — URINE CULTURE
Colony Count: >=100000
Special Requests: NORMAL

## 2014-09-12 NOTE — ED Provider Notes (Signed)
Medical screening examination/treatment/procedure(s) were performed by non-physician practitioner and as supervising physician I was immediately available for consultation/collaboration.   EKG Interpretation None       Babette Relic, MD 09/12/14 2201

## 2014-09-28 ENCOUNTER — Encounter (HOSPITAL_COMMUNITY): Payer: Self-pay | Admitting: Emergency Medicine

## 2014-09-30 ENCOUNTER — Encounter (HOSPITAL_COMMUNITY): Payer: Self-pay

## 2014-09-30 ENCOUNTER — Inpatient Hospital Stay (HOSPITAL_COMMUNITY)
Admission: EM | Admit: 2014-09-30 | Discharge: 2014-10-02 | DRG: 394 | Disposition: A | Discharge status: Home / Self Care | Payer: No Typology Code available for payment source | Attending: Internal Medicine | Admitting: Internal Medicine

## 2014-09-30 DIAGNOSIS — Y838 Other surgical procedures as the cause of abnormal reaction of the patient, or of later complication, without mention of misadventure at the time of the procedure: Secondary | ICD-10-CM | POA: Diagnosis present

## 2014-09-30 DIAGNOSIS — D126 Benign neoplasm of colon, unspecified: Secondary | ICD-10-CM

## 2014-09-30 DIAGNOSIS — E876 Hypokalemia: Secondary | ICD-10-CM | POA: Diagnosis present

## 2014-09-30 DIAGNOSIS — Z87442 Personal history of urinary calculi: Secondary | ICD-10-CM

## 2014-09-30 DIAGNOSIS — N200 Calculus of kidney: Secondary | ICD-10-CM

## 2014-09-30 DIAGNOSIS — E871 Hypo-osmolality and hyponatremia: Secondary | ICD-10-CM | POA: Diagnosis present

## 2014-09-30 DIAGNOSIS — Z9049 Acquired absence of other specified parts of digestive tract: Secondary | ICD-10-CM | POA: Diagnosis present

## 2014-09-30 DIAGNOSIS — E860 Dehydration: Secondary | ICD-10-CM | POA: Diagnosis present

## 2014-09-30 DIAGNOSIS — Z85038 Personal history of other malignant neoplasm of large intestine: Secondary | ICD-10-CM

## 2014-09-30 DIAGNOSIS — R7989 Other specified abnormal findings of blood chemistry: Secondary | ICD-10-CM | POA: Diagnosis present

## 2014-09-30 DIAGNOSIS — Z932 Ileostomy status: Secondary | ICD-10-CM

## 2014-09-30 DIAGNOSIS — K9419 Other complications of enterostomy: Principal | ICD-10-CM | POA: Diagnosis present

## 2014-09-30 DIAGNOSIS — N189 Chronic kidney disease, unspecified: Secondary | ICD-10-CM | POA: Diagnosis present

## 2014-09-30 DIAGNOSIS — C189 Malignant neoplasm of colon, unspecified: Secondary | ICD-10-CM | POA: Diagnosis present

## 2014-09-30 DIAGNOSIS — R198 Other specified symptoms and signs involving the digestive system and abdomen: Secondary | ICD-10-CM

## 2014-09-30 DIAGNOSIS — K219 Gastro-esophageal reflux disease without esophagitis: Secondary | ICD-10-CM | POA: Diagnosis present

## 2014-09-30 DIAGNOSIS — N179 Acute kidney failure, unspecified: Secondary | ICD-10-CM | POA: Diagnosis present

## 2014-09-30 DIAGNOSIS — E872 Acidosis: Secondary | ICD-10-CM | POA: Diagnosis present

## 2014-09-30 DIAGNOSIS — D1391 Familial adenomatous polyposis: Secondary | ICD-10-CM | POA: Diagnosis present

## 2014-09-30 DIAGNOSIS — E878 Other disorders of electrolyte and fluid balance, not elsewhere classified: Secondary | ICD-10-CM | POA: Diagnosis present

## 2014-09-30 HISTORY — DX: Familial adenomatous polyposis: D13.91

## 2014-09-30 HISTORY — DX: Benign neoplasm of colon, unspecified: D12.6

## 2014-09-30 HISTORY — DX: Calculus of kidney: N20.0

## 2014-09-30 LAB — COMPREHENSIVE METABOLIC PANEL
ALK PHOS: 102 U/L (ref 39–117)
ALT: 12 U/L (ref 0–35)
AST: 16 U/L (ref 0–37)
Albumin: 4.4 g/dL (ref 3.5–5.2)
Anion gap: 21 — ABNORMAL HIGH (ref 5–15)
BUN: 31 mg/dL — ABNORMAL HIGH (ref 6–23)
CO2: 16 mEq/L — ABNORMAL LOW (ref 19–32)
Calcium: 9.6 mg/dL (ref 8.4–10.5)
Chloride: 91 mEq/L — ABNORMAL LOW (ref 96–112)
Creatinine, Ser: 1.63 mg/dL — ABNORMAL HIGH (ref 0.50–1.10)
GFR calc Af Amer: 50 mL/min — ABNORMAL LOW (ref >90)
GFR, EST NON AFRICAN AMERICAN: 43 mL/min — AB (ref >90)
GLUCOSE: 86 mg/dL (ref 70–99)
POTASSIUM: 3.1 meq/L — AB (ref 3.7–5.3)
Sodium: 128 mEq/L — ABNORMAL LOW (ref 137–147)
Total Bilirubin: 0.2 mg/dL — ABNORMAL LOW (ref 0.3–1.2)
Total Protein: 9 g/dL — ABNORMAL HIGH (ref 6.0–8.3)

## 2014-09-30 LAB — URINE MICROSCOPIC-ADD ON

## 2014-09-30 LAB — CBC WITH DIFFERENTIAL/PLATELET
BASOS ABS: 0 10*3/uL (ref 0.0–0.1)
BASOS PCT: 0 % (ref 0–1)
Eosinophils Absolute: 0.1 10*3/uL (ref 0.0–0.7)
Eosinophils Relative: 1 % (ref 0–5)
HEMATOCRIT: 41.3 % (ref 36.0–46.0)
Hemoglobin: 14.2 g/dL (ref 12.0–15.0)
Lymphocytes Relative: 23 % (ref 12–46)
Lymphs Abs: 2.3 10*3/uL (ref 0.7–4.0)
MCH: 28.1 pg (ref 26.0–34.0)
MCHC: 34.4 g/dL (ref 30.0–36.0)
MCV: 81.6 fL (ref 78.0–100.0)
MONO ABS: 0.7 10*3/uL (ref 0.1–1.0)
Monocytes Relative: 7 % (ref 3–12)
NEUTROS ABS: 6.8 10*3/uL (ref 1.7–7.7)
Neutrophils Relative %: 69 % (ref 43–77)
PLATELETS: 392 10*3/uL (ref 150–400)
RBC: 5.06 MIL/uL (ref 3.87–5.11)
RDW: 13.6 % (ref 11.5–15.5)
WBC: 9.8 10*3/uL (ref 4.0–10.5)

## 2014-09-30 LAB — BLOOD GAS, VENOUS
Acid-base deficit: 7 mmol/L — ABNORMAL HIGH (ref 0.0–2.0)
Bicarbonate: 19.2 mEq/L — ABNORMAL LOW (ref 20.0–24.0)
O2 Saturation: 44.9 %
PCO2 VEN: 43.6 mmHg — AB (ref 45.0–50.0)
Patient temperature: 98.6
TCO2: 18.3 mmol/L (ref 0–100)
pH, Ven: 7.267 (ref 7.250–7.300)

## 2014-09-30 LAB — MAGNESIUM: Magnesium: 2.5 mg/dL (ref 1.5–2.5)

## 2014-09-30 LAB — URINALYSIS, ROUTINE W REFLEX MICROSCOPIC
BILIRUBIN URINE: NEGATIVE
Glucose, UA: NEGATIVE mg/dL
Ketones, ur: NEGATIVE mg/dL
Leukocytes, UA: NEGATIVE
NITRITE: NEGATIVE
Protein, ur: NEGATIVE mg/dL
SPECIFIC GRAVITY, URINE: 1.011 (ref 1.005–1.030)
UROBILINOGEN UA: 0.2 mg/dL (ref 0.0–1.0)
pH: 6 (ref 5.0–8.0)

## 2014-09-30 LAB — PHOSPHORUS: PHOSPHORUS: 5.2 mg/dL — AB (ref 2.3–4.6)

## 2014-09-30 LAB — PREGNANCY, URINE: Preg Test, Ur: NEGATIVE

## 2014-09-30 MED ORDER — SODIUM CHLORIDE 0.9 % IV SOLN: INTRAVENOUS | Status: DC

## 2014-09-30 MED ORDER — POTASSIUM CHLORIDE 20 MEQ/15ML (10%) PO SOLN
40.0000 meq | ORAL | Status: AC
  Administered 2014-09-30 (×2): 40 meq via ORAL
  Filled 2014-09-30 (×2): qty 30

## 2014-09-30 MED ORDER — SODIUM CHLORIDE 0.9 % IV BOLUS (SEPSIS)
1000.0000 mL | Freq: Once | INTRAVENOUS | Status: AC
  Administered 2014-09-30: 1000 mL via INTRAVENOUS

## 2014-09-30 MED ORDER — SODIUM CHLORIDE 0.9 % IJ SOLN
3.0000 mL | Freq: Two times a day (BID) | INTRAMUSCULAR | Status: DC
  Administered 2014-09-30: 3 mL via INTRAVENOUS

## 2014-09-30 MED ORDER — MORPHINE SULFATE 2 MG/ML IJ SOLN: 2.0000 mg | INTRAMUSCULAR | Status: DC | PRN

## 2014-09-30 MED ORDER — ENOXAPARIN SODIUM 40 MG/0.4ML ~~LOC~~ SOLN
40.0000 mg | SUBCUTANEOUS | Status: DC
  Administered 2014-09-30 – 2014-10-01 (×2): 40 mg via SUBCUTANEOUS
  Filled 2014-09-30 (×3): qty 0.4

## 2014-09-30 MED ORDER — SODIUM CHLORIDE 0.9 % IV SOLN
INTRAVENOUS | Status: DC
  Administered 2014-09-30 – 2014-10-02 (×6): via INTRAVENOUS

## 2014-09-30 MED ORDER — ACETAMINOPHEN 650 MG RE SUPP: 650.0000 mg | Freq: Four times a day (QID) | RECTAL | Status: DC | PRN

## 2014-09-30 MED ORDER — ACETAMINOPHEN 325 MG PO TABS: 650.0000 mg | ORAL_TABLET | Freq: Four times a day (QID) | ORAL | Status: DC | PRN

## 2014-09-30 MED ORDER — POTASSIUM CHLORIDE CRYS ER 20 MEQ PO TBCR
40.0000 meq | EXTENDED_RELEASE_TABLET | ORAL | Status: DC
  Filled 2014-09-30: qty 2

## 2014-09-30 MED ORDER — LACTATED RINGERS IV BOLUS (SEPSIS)
1000.0000 mL | Freq: Once | INTRAVENOUS | Status: AC
  Administered 2014-09-30: 1000 mL via INTRAVENOUS

## 2014-09-30 MED ORDER — BOOST PLUS PO LIQD
237.0000 mL | Freq: Two times a day (BID) | ORAL | Status: DC
  Administered 2014-09-30 – 2014-10-02 (×5): 237 mL via ORAL
  Filled 2014-09-30 (×6): qty 237

## 2014-09-30 MED ORDER — ONDANSETRON HCL 4 MG/2ML IJ SOLN: 4.0000 mg | Freq: Four times a day (QID) | INTRAMUSCULAR | Status: DC | PRN

## 2014-09-30 MED ORDER — ALBUTEROL SULFATE (2.5 MG/3ML) 0.083% IN NEBU: 2.5000 mg | INHALATION_SOLUTION | RESPIRATORY_TRACT | Status: DC | PRN

## 2014-09-30 MED ORDER — ONDANSETRON HCL 4 MG PO TABS: 4.0000 mg | ORAL_TABLET | Freq: Four times a day (QID) | ORAL | Status: DC | PRN

## 2014-09-30 MED ORDER — OXYCODONE-ACETAMINOPHEN 5-325 MG PO TABS: 1.0000 | ORAL_TABLET | Freq: Four times a day (QID) | ORAL | Status: DC | PRN

## 2014-09-30 NOTE — H&P (Signed)
[facility] History and Physical  Cindy [name] [id number] DOB: 1991/03/21 DOA: 09/30/2014  Referring physician: Dr Ralene Bathe PCP: Vidal Schwalbe, MD   Chief Complaint: dehydration  HPI: Cindy [name] is a 23 y.o. female  With history of iron deficiency anemia, history of FAP, locally advanced colon cancer and subtotal colectomy, s/p completion proctectomy and revision end ileostomy for FAP with lysis of abdominal adhesions 9/1/2015per Dr. Morton Stall at [facility]. Patient subsequently developed a perineal abscess in the postoperative period and underwent I and D of perineal abscess in 9 /16/2015. Patient also with a history of nephrolithiasis, GERD recently treated with amoxicillin per patient for UTI proximally 2 weeks prior to admission, who presents to the ED with a one-day history of increased ostomy output. Patient states has had to empty her ostomy bag on a hourly basis over the past day and feels she is dehydrated. Patient stated so PCP one day prior to admission the blood pressure systolic was noted to be in the 70s. Patient denies any chest pain, no shortness of breath, no fever, no chills, no nausea, no vomiting, no abdominal pain, no dysuria, no melena, no hematemesis, no hematochezia. Patient does endorse some generalized weakness. Patient was seen in the emergency room where labs obtained showed a sodium of 128, potassium of 3.1, chloride of 91, bicarbonate of 16, BUN of 31 and a creatinine of 1.63. CBC was unremarkable.urinalysis was nitrite negative leukocytes negative. Urine pregnancy was negative.  [facility] were called to admit the patient for electrolyte abnormalities and increased ostomy output.   Review of Systems: as per history of present illness otherwise negative. Constitutional:  No weight loss, night sweats, Fevers, chills, fatigue.  HEENT:  No headaches, Difficulty swallowing,Tooth/dental problems,Sore throat,  No sneezing, itching, ear ache, nasal  congestion, post nasal drip,  Cardio-vascular:  No chest pain, Orthopnea, PND, swelling in lower extremities, anasarca, dizziness, palpitations  GI:  No heartburn, indigestion, abdominal pain, nausea, vomiting, diarrhea, change in bowel habits, loss of appetite  Resp:  No shortness of breath with exertion or at rest. No excess mucus, no productive cough, No non-productive cough, No coughing up of blood.No change in color of mucus.No wheezing.No chest wall deformity  Skin:  no rash or lesions.  GU:  no dysuria, change in color of urine, no urgency or frequency. No flank pain.  Musculoskeletal:  No joint pain or swelling. No decreased range of motion. No back pain.  Psych:  No change in mood or affect. No depression or anxiety. No memory loss.   Past Medical History  Diagnosis Date  . UTI (lower urinary tract infection)   . Heart murmur   . Pneumonia 2008; 2012  . Complication of anesthesia     "I swallowed the anesthesia when appendix taken out; got pneumonia"  . Iron deficiency anemia   . History of blood transfusion 04/26/2013    "4 bags" (05/21/2013)  . Colon cancer 2014    s/p resection and ileostomy  . 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 FAP (familial adenomatous polyposis) 09/30/2014  . Nephrolithiasis 09/30/2014   Past Surgical History  Procedure Laterality Date  . Leg reconstruction using fascial flap  ~ 2009  . Still born baby  03/22/13    delivered vaginally @ ~ 24 weeks  . Appendectomy  2008  . Colonoscopy N/A 05/24/2013    Procedure: COLONOSCOPY;  Surgeon: Missy Sabins, MD;  Location: [place];  Service: Endoscopy;  Laterality:  N/A;  . Colon surgery    . Ileostomy  06/26/2013  . I&d extremity Left 11/14/2013    Procedure: IRRIGATION AND DEBRIDEMENT LEFT LONG FINGER;  Surgeon: Tennis Must, MD;  Location: [place];  Service: Orthopedics;  Laterality: Left;   Social History:  reports that she has never smoked. She has  never used smokeless tobacco. She reports that she does not drink alcohol or use illicit drugs.  Allergies  Allergen Reactions  . 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 Other Hives and Itching    Dove soap    Family History  Problem Relation Age of Onset  . Adopted: Yes  . Alcohol abuse Neg Hx   . Arthritis Neg Hx   . Asthma Neg Hx   . Birth defects Neg Hx   . Cancer Neg Hx   . COPD Neg Hx   . Depression Neg Hx   . Diabetes Neg Hx   . Drug abuse Neg Hx   . Early death Neg Hx   . Hearing loss Neg Hx   . Heart disease Neg Hx   . Hyperlipidemia Neg Hx   . Hypertension Neg Hx   . Kidney disease Neg Hx   . Learning disabilities Neg Hx   . Mental illness Neg Hx   . Mental retardation Neg Hx   . Miscarriages / Stillbirths Neg Hx   . Stroke Neg Hx   . Vision loss Neg Hx   . ALS Mother      Prior to Admission medications   Medication Sig Start Date End Date Taking? Authorizing Provider  EPINEPHrine 0.3 mg/0.3 mL IJ SOAJ injection Inject 0.3 mg into the muscle as needed (allergic reaction.). 08/10/13  Yes Joanell Rising, MD  lactose free nutrition (BOOST PLUS) LIQD Take 237 mLs by mouth 2 (two) times daily between meals. 09/09/14  Yes Robbie Lis, MD  Loperamide HCl (IMODIUM A-D) 1 MG/7.5ML LIQD Take 15 mLs (2 mg total) by mouth every 6 (six) hours as needed. 07/12/14  Yes Venetia Maxon Rama, MD  oxyCODONE-acetaminophen (PERCOCET/ROXICET) 5-325 MG per tablet Take 1-2 tablets by mouth every 6 (six) hours as needed for severe pain. 06/27/14  Yes Heather Laisure, PA-C  potassium chloride (K-DUR) 10 MEQ tablet Take 1-2 tabs daily as needed for high ostomy output. 07/12/14  Yes Venetia Maxon Rama, MD  amoxicillin (AMOXIL) 400 MG/5ML suspension Take 5 mLs (400 mg total) by mouth 2 (two) times daily. 09/09/14   Robbie Lis, MD   Physical Exam: Filed Vitals:   09/30/14 0731 09/30/14 0735 09/30/14 1121 09/30/14 1312  BP:  90/72 105/61 94/52  Pulse:  102 89 82  Temp:  97.4 F (36.3 C) 98.6 F  (37 C) 98.4 F (36.9 C)  TempSrc:  Oral Oral Oral  Resp:   20 18  Height:  5\' 4"  (1.626 m)  5\' 1"  (1.549 m)  Weight:  49.442 kg (109 lb)  49.442 kg (109 lb)  SpO2: 100% 100% 100% 100%    Wt Readings from Last 3 Encounters:  09/30/14 49.442 kg (109 lb)  09/08/14 47.854 kg (105 lb 8 oz)  07/11/14 47.809 kg (105 lb 6.4 oz)    General:  Thin female laying on gurney in no acute cardiopulmonary distress. Eyes: PERRLA, EOMI, normal lids, irises & conjunctiva ENT: grossly normal hearing, lips & tongue Neck: no LAD, masses or thyromegaly Cardiovascular: RRR, no m/r/g. No LE edema Respiratory: CTA bilaterally, no w/r/r. Normal respiratory effort. GI:  soft, ntnd, positive bowel sounds. Ostomy bag with liquid stool. Skin: no rash or induration seen on limited exam Musculoskeletal: grossly normal tone BUE/BLE Psychiatric: grossly normal mood and affect, speech fluent and appropriate Neurologic: alert and oriented 3. CN 2 through 12 are grossly intact. No focal deficits.          Labs on Admission:  Basic Metabolic Panel:  Recent Labs Lab 09/30/14 0806 09/30/14 1341  NA 128*  --   K 3.1*  --   CL 91*  --   CO2 16*  --   GLUCOSE 86  --   BUN 31*  --   CREATININE 1.63*  --   CALCIUM 9.6  --   MG 2.5  --   PHOS  --  5.2*   Liver Function Tests:  Recent Labs Lab 09/30/14 0806  AST 16  ALT 12  ALKPHOS 102  BILITOT 0.2*  PROT 9.0*  ALBUMIN 4.4   No results for input(s): LIPASE, AMYLASE in the last 168 hours. No results for input(s): AMMONIA in the last 168 hours. CBC:  Recent Labs Lab 09/30/14 0806  WBC 9.8  NEUTROABS 6.8  HGB 14.2  HCT 41.3  MCV 81.6  PLT 392   Cardiac Enzymes: No results for input(s): CKTOTAL, CKMB, CKMBINDEX, TROPONINI in the last 168 hours.  BNP (last 3 results)  Recent Labs  05/13/14 1205  PROBNP 71.8   CBG: No results for input(s): GLUCAP in the last 168 hours.  Radiological Exams on Admission: No results found.  EKG:  none  Assessment/Plan Principal Problem:   High output ileostomy Active Problems:   Hypokalemia   Adenocarcinoma of colon   Acute renal failure   Dehydration   Hyponatremia   Metabolic acidosis   Acute nontraumatic kidney injury   FAP (familial adenomatous polyposis)   Nephrolithiasis   #1 high ileostomy output Questionable etiology. Will check a GI pathogen panel. Check a C. Difficile PCR. Patient states was recently on antibiotics for UTI. Place on IV fluids. Replete electrolytes. Follow. If GI pathogen panel is negative with no improvement in ileostomy output will consult with GI for further evaluation and management.  #2 electrolyte abnormalities/hyponatremia/hypokalemia Secondary to GI loss secondary to problem #1. Magnesium level at 2.5. Will check a phosphorus level. Place on IV fluids. Replete potassium. Follow.  #3 acute renal failure Likely secondary to prerenal azotemia. Check a fractional excretion of sodium. Hydrated with IV fluids. Follow.  #4 dehydration Secondary to GI losses. IV fluids.  #5 metabolic acidosis Likely secondary to GI losses. Replete electrolytes. Place on IV fluids. Follow. If no improvement with acidosis we'll place on bicarbonate drip. Follow.  #6 history of FAP status post subtotal colectomy with completion proctectomy and revision and ileostomy Outpatient follow-up.   #7 prophylaxis Lovenox for DVT prophylaxis.   Code Status: Full DVT Prophylaxis: Lovenox Family Communication: Updated patient, no family present. Disposition Plan: Admit to telemetry  Time spent: 70 mins  [facility] MD [facility] Pager [phone]

## 2014-09-30 NOTE — Care Management Note (Addendum)
    Page 1 of 1   10/02/2014     3:32:29 PM CARE MANAGEMENT NOTE 10/02/2014  Patient:  Cindy [name], Cindy [name]   Account Number:  [id number]  Date Initiated:  09/30/2014  Documentation initiated by:  Dessa Phi  Subjective/Objective Assessment:   23 Y/O F ADMITTED W/N/V.OSTOMY.     Action/Plan:   FROM HOME.INDEPENDANT W/OSTOMY.   Anticipated DC Date:  10/02/2014   Anticipated DC Plan:  [place]  CM consult      Choice offered to / List presented to:             Status of service:  Completed, signed off Medicare Important Message given?   (If response is "NO", the following Medicare IM given date fields will be blank) Date Medicare IM given:   Medicare IM given by:   Date Additional Medicare IM given:   Additional Medicare IM given by:    Discharge Disposition:  HOME/SELF CARE  Per UR Regulation:  Reviewed for med. necessity/level of care/duration of stay  If discussed at [place] of Stay Meetings, dates discussed:    Comments:  10/02/14 Onalee Steinbach RN,BSN NCM [phone] D/C HOME NO [place].  09/30/14 Melven Stockard RN,BSN NCM [place] ANTICIP[place] D/C NEEDS.

## 2014-09-30 NOTE — ED Provider Notes (Signed)
CSN: [id number]     Arrival date & time 09/30/14  0715 History   First MD Initiated Contact with Patient 09/30/14 0740     Chief Complaint  Patient presents with  . Dehydration     HPI Comments: Cindy [name] presents to the emergency department for increased output from her from her ileostomy and concern for dehydration. She reports increased output starting yesterday that is nonbloody. She is emptying the pouch hourly. She has mild increased abdominal soreness but no other symptoms. She denies fever, vomiting, nausea, dysuria, or decreased oral intake.  Patient has no history of C. Difficile. She reports feeling fatigued and winded with mild exertion. Yesterday she saw her PCP with concern for dehydration and was told to report emergency department if she had continued symptoms.  The history is provided by the patient.    Past Medical History  Diagnosis Date  . UTI (lower urinary tract infection)   . Heart murmur   . Pneumonia 2008; 2012  . Complication of anesthesia     "I swallowed the anesthesia when appendix taken out; got pneumonia"  . Iron deficiency anemia   . History of blood transfusion 04/26/2013    "4 bags" (05/21/2013)  . Colon cancer 2014    s/p resection and ileostomy  . 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 Leg reconstruction using fascial flap  ~ 2009  . Still born baby  03/22/13    delivered vaginally @ ~ 24 weeks  . Appendectomy  2008  . Colonoscopy N/A 05/24/2013    Procedure: COLONOSCOPY;  Surgeon: Missy Sabins, MD;  Location: [place];  Service: Endoscopy;  Laterality: N/A;  . Colon surgery    . Ileostomy  06/26/2013  . I&d extremity Left 11/14/2013    Procedure: IRRIGATION AND DEBRIDEMENT LEFT LONG FINGER;  Surgeon: Tennis Must, MD;  Location: [place];  Service: Orthopedics;  Laterality: Left;   Family History  Problem Relation Age of Onset  . Adopted: Yes  .  Alcohol abuse Neg Hx   . Arthritis Neg Hx   . Asthma Neg Hx   . Birth defects Neg Hx   . Cancer Neg Hx   . COPD Neg Hx   . Depression Neg Hx   . Diabetes Neg Hx   . Drug abuse Neg Hx   . Early death Neg Hx   . Hearing loss Neg Hx   . Heart disease Neg Hx   . Hyperlipidemia Neg Hx   . Hypertension Neg Hx   . Kidney disease Neg Hx   . Learning disabilities Neg Hx   . Mental illness Neg Hx   . Mental retardation Neg Hx   . Miscarriages / Stillbirths Neg Hx   . Stroke Neg Hx   . Vision loss Neg Hx   . ALS Mother    History  Substance Use Topics  . Smoking status: Never Smoker   . Smokeless tobacco: Never Used  . Alcohol Use: No   OB History    Gravida Para Term Preterm AB TAB SAB Ectopic Multiple Living   1 1  1            Review of Systems  Constitutional: Positive for fatigue.  Respiratory: Positive for shortness of breath.   Cardiovascular: Negative for chest pain.  Genitourinary: Negative for dysuria and difficulty urinating.  All other systems reviewed and are negative.  Allergies  Orange fruit and Other  Home Medications   Prior to Admission medications   Medication Sig Start Date End Date Taking? Authorizing Provider  amoxicillin (AMOXIL) 400 MG/5ML suspension Take 5 mLs (400 mg total) by mouth 2 (two) times daily. 09/09/14   Robbie Lis, MD  EPINEPHrine 0.3 mg/0.3 mL IJ SOAJ injection Inject 0.3 mg into the muscle as needed (allergic reaction.). 08/10/13   Joanell Rising, MD  lactose free nutrition (BOOST PLUS) LIQD Take 237 mLs by mouth 2 (two) times daily between meals. 09/09/14   Robbie Lis, MD  Loperamide HCl (IMODIUM A-D) 1 MG/7.5ML LIQD Take 15 mLs (2 mg total) by mouth every 6 (six) hours as needed. 07/12/14   Venetia Maxon Rama, MD  oxyCODONE-acetaminophen (PERCOCET/ROXICET) 5-325 MG per tablet Take 1-2 tablets by mouth every 6 (six) hours as needed for severe pain. 06/27/14   Heather Laisure, PA-C  potassium chloride (K-DUR) 10 MEQ tablet Take  1-2 tabs daily as needed for high ostomy output. 07/12/14   Venetia Maxon Rama, MD   BP 90/72 mmHg  Pulse 102  Temp(Src) 97.4 F (36.3 C) (Oral)  Ht 5\' 4"  (1.626 m)  Wt 109 lb (49.442 kg)  BMI 18.70 kg/m2  SpO2 100%  LMP 09/28/2014 (Exact Date) Physical Exam  Constitutional: She is oriented to person, place, and time. She appears well-developed.  Mild distress  HENT:  Head: Normocephalic.  Neck: Neck supple.  Cardiovascular: Normal rate, regular rhythm and normal heart sounds.   No murmur heard. Pulmonary/Chest: Effort normal and breath sounds normal. No respiratory distress.  Abdominal:  Abdomen soft mildly tender diffusely without guarding or rebound. There is an ileostomy pouch in the right lower quadrant with green watery stool.  Musculoskeletal: She exhibits no edema or tenderness.  Neurological: She is alert and oriented to person, place, and time.  Skin: Skin is warm and dry.  Psychiatric: She has a normal mood and affect.  Nursing note and vitals reviewed.   ED Course  Procedures (including critical care time) Labs Review Labs Reviewed  COMPREHENSIVE METABOLIC PANEL - Abnormal; Notable for the following:    Sodium 128 (*)    Potassium 3.1 (*)    Chloride 91 (*)    CO2 16 (*)    BUN 31 (*)    Creatinine, Ser 1.63 (*)    Total Protein 9.0 (*)    Total Bilirubin 0.2 (*)    GFR calc non Af Amer 43 (*)    GFR calc Af Amer 50 (*)    Anion gap 21 (*)    All other components within normal limits  URINALYSIS, ROUTINE W REFLEX MICROSCOPIC - Abnormal; Notable for the following:    Hgb urine dipstick LARGE (*)    All other components within normal limits  BLOOD GAS, VENOUS - Abnormal; Notable for the following:    pCO2, Ven 43.6 (*)    Bicarbonate 19.2 (*)    Acid-base deficit 7.0 (*)    All other components within normal limits  CLOSTRIDIUM DIFFICILE BY PCR  CBC WITH DIFFERENTIAL  PREGNANCY, URINE  MAGNESIUM  URINE MICROSCOPIC-ADD ON    Imaging Review No  results found.   EKG Interpretation None      MDM   Final diagnoses:  Acute-on-chronic kidney injury  Hyponatremia   Patient here with high output from her ileostomy and acute dehydration with hyponatremia, elevated anion gap, and acute kidney injury. No evidence of serious bacterial infection. Do not expect do not  suspect obstructing renal stone. Discussed with medicine regarding admission for hydration and evaluation for improvement in renal function and electrolytes given concern for worsening since she is already good by mouth intake.    Cindy Reichert, MD 09/30/14 1204

## 2014-09-30 NOTE — Plan of Care (Signed)
Problem: Phase I Progression Outcomes Goal: Pain controlled with appropriate interventions Outcome: Completed/Met Date Met:  09/30/14     

## 2014-09-30 NOTE — ED Notes (Signed)
Pt reports noticing increased output from her illeostomy. Pt states experiencing dehydrated. Pt denies pain. Pt reports SOB, and consulting with PCP in regards to dehydration and was told to come to ED.

## 2014-09-30 NOTE — Progress Notes (Signed)
The B/P was 86/59.  PCP on call was notified.  Awaiting any new orders. RN will continue to monitor the patient.

## 2014-10-01 DIAGNOSIS — D126 Benign neoplasm of colon, unspecified: Secondary | ICD-10-CM

## 2014-10-01 LAB — GI PATHOGEN PANEL BY PCR, STOOL
C DIFFICILE TOXIN A/B: NEGATIVE
CRYPTOSPORIDIUM BY PCR: NEGATIVE
Campylobacter by PCR: NEGATIVE
E COLI (ETEC) LT/ST: NEGATIVE
E COLI 0157 BY PCR: NEGATIVE
E coli (STEC): NEGATIVE
G lamblia by PCR: NEGATIVE
Norovirus GI/GII: NEGATIVE
Rotavirus A by PCR: NEGATIVE
SHIGELLA BY PCR: NEGATIVE
Salmonella by PCR: NEGATIVE

## 2014-10-01 LAB — CREATININE, URINE, RANDOM: Creatinine, Urine: 119.1 mg/dL

## 2014-10-01 LAB — CBC
HEMATOCRIT: 30.7 % — AB (ref 36.0–46.0)
Hemoglobin: 10.2 g/dL — ABNORMAL LOW (ref 12.0–15.0)
MCH: 27.6 pg (ref 26.0–34.0)
MCHC: 33.2 g/dL (ref 30.0–36.0)
MCV: 83.2 fL (ref 78.0–100.0)
PLATELETS: 223 10*3/uL (ref 150–400)
RBC: 3.69 MIL/uL — ABNORMAL LOW (ref 3.87–5.11)
RDW: 13.7 % (ref 11.5–15.5)
WBC: 5.5 10*3/uL (ref 4.0–10.5)

## 2014-10-01 LAB — CLOSTRIDIUM DIFFICILE BY PCR: Toxigenic C. Difficile by PCR: NEGATIVE

## 2014-10-01 LAB — COMPREHENSIVE METABOLIC PANEL
ALK PHOS: 64 U/L (ref 39–117)
ALT: 7 U/L (ref 0–35)
AST: 11 U/L (ref 0–37)
Albumin: 2.7 g/dL — ABNORMAL LOW (ref 3.5–5.2)
Anion gap: 11 (ref 5–15)
BUN: 16 mg/dL (ref 6–23)
CHLORIDE: 113 meq/L — AB (ref 96–112)
CO2: 18 mEq/L — ABNORMAL LOW (ref 19–32)
Calcium: 7.9 mg/dL — ABNORMAL LOW (ref 8.4–10.5)
Creatinine, Ser: 1.19 mg/dL — ABNORMAL HIGH (ref 0.50–1.10)
GFR calc Af Amer: 73 mL/min — ABNORMAL LOW (ref >90)
GFR calc non Af Amer: 63 mL/min — ABNORMAL LOW (ref >90)
Glucose, Bld: 95 mg/dL (ref 70–99)
POTASSIUM: 3 meq/L — AB (ref 3.7–5.3)
Sodium: 142 mEq/L (ref 137–147)
Total Protein: 5.6 g/dL — ABNORMAL LOW (ref 6.0–8.3)

## 2014-10-01 LAB — SODIUM, URINE, RANDOM: Sodium, Ur: <15 mEq/L

## 2014-10-01 MED ORDER — SODIUM CHLORIDE 0.9 % IV BOLUS (SEPSIS)
1000.0000 mL | Freq: Once | INTRAVENOUS | Status: AC
  Administered 2014-10-01: 1000 mL via INTRAVENOUS

## 2014-10-01 MED ORDER — POTASSIUM CHLORIDE 20 MEQ/15ML (10%) PO SOLN
40.0000 meq | ORAL | Status: AC
  Administered 2014-10-01 (×2): 40 meq via ORAL
  Filled 2014-10-01 (×2): qty 30

## 2014-10-01 MED ORDER — CALCIUM CITRATE-VITAMIN D 500-400 MG-UNIT PO CHEW
1.0000 | CHEWABLE_TABLET | Freq: Three times a day (TID) | ORAL | Status: DC
  Administered 2014-10-01 – 2014-10-02 (×5): 1 via ORAL
  Filled 2014-10-01 (×5): qty 1

## 2014-10-01 MED ORDER — SODIUM BICARBONATE 650 MG PO TABS
650.0000 mg | ORAL_TABLET | Freq: Two times a day (BID) | ORAL | Status: DC
  Administered 2014-10-01 (×2): 650 mg via ORAL
  Filled 2014-10-01 (×3): qty 1

## 2014-10-01 MED ORDER — LACTATED RINGERS IV BOLUS (SEPSIS): 1000.0000 mL | Freq: Once | INTRAVENOUS | Status: DC

## 2014-10-01 NOTE — Plan of Care (Signed)
Problem: Phase I Progression Outcomes Goal: Initial discharge plan identified Outcome: Completed/Met Date Met:  10/01/14 Goal: Voiding-avoid urinary catheter unless indicated Outcome: Completed/Met Date Met:  10/01/14 Goal: Other Phase I Outcomes/Goals Outcome: Completed/Met Date Met:  10/01/14

## 2014-10-01 NOTE — Consult Note (Signed)
WOC ostomy consult note Stoma type/location: RLQ Ileostomy Stomal assessment/size: 1" round, pink and moist Peristomal assessment: Intact Treatment options for stomal/peristomal skin: None Output High output liquid green stool.  Ostomy pouching: /2pc. 2 1/4" system.  WIll order barrier rings in case needed. POuch was just applied last night and is intact at this time.  Education provided:  Patient is independent in care.  HAs been wearing COnvatec product at home.  TOld her I was ordering a comparable product.  Verbalized understanding.

## 2014-10-01 NOTE — Progress Notes (Signed)
[facility] PROGRESS NOTE  Cindy [name] [id number] DOB: Jul 30, 1991 DOA: 09/30/2014 PCP: Vidal Schwalbe, MD  Assessment/Plan: #1. High ostomy output Unknown etiology. C. Difficile PCR negative. GI panel pending. Clinical improvement. Continue supportive care with IV fluids. Outpatient follow-up.  #2 hypokalemia/hyponatremia/hypocalcemia Secondary to GI losses. Replete potassium. Hyponatremia improved with IV fluids. Will place on oral calcium with vitamin D tablets.  #3 acute renal failure Secondary to prerenal azotemia. Urine sodium was less than 15. Renal function improving with hydration. Continue IV fluids.  #4 dehydration IV fluids.  #5 metabolic acidosis Secondary to GI losses. Replete electrolytes. Improving. Placed on oral bicarbonate tablets. Follow.  #6 history of FAP status post total colectomy with completion proctectomy and revision ileostomy Outpatient follow-up.  #7 prophylaxis Lovenox for DVT prophylaxis.  Code Status: full Family Communication: updated patient, no family at bedside. Disposition Plan: home when medically stable hopefully tomorrow.   Consultants:  none  Procedures:  none  Antibiotics:  none  HPI/Subjective: Patient states she's feeling better. Patient states decreased ostomy output. Per nursing decreased ostomy output.  Objective: Filed Vitals:   10/01/14 0931  BP: 93/50  Pulse: 79  Temp: 97.6 F (36.4 C)  Resp: 18    Intake/Output Summary (Last 24 hours) at 10/01/14 1059 Last data filed at 10/01/14 0600  Gross per 24 hour  Intake 2465.83 ml  Output    125 ml  Net 2340.83 ml   Filed Weights   09/30/14 0735 09/30/14 1312 10/01/14 0300  Weight: 49.442 kg (109 lb) 49.442 kg (109 lb) 50.3 kg (110 lb 14.3 oz)    Exam:   General:  nad  Cardiovascular: rrr  Respiratory: ctab  Abdomen: soft, nontender, nondistended, positive bowel sounds.ileostomy with loose stools  Musculoskeletal: no clubbing  cyanosis or edema  Data Reviewed: Basic Metabolic Panel:  Recent Labs Lab 09/30/14 0806 09/30/14 1341 10/01/14 0438  NA 128*  --  142  K 3.1*  --  3.0*  CL 91*  --  113*  CO2 16*  --  18*  GLUCOSE 86  --  95  BUN 31*  --  16  CREATININE 1.63*  --  1.19*  CALCIUM 9.6  --  7.9*  MG 2.5  --   --   PHOS  --  5.2*  --    Liver Function Tests:  Recent Labs Lab 09/30/14 0806 10/01/14 0438  AST 16 11  ALT 12 7  ALKPHOS 102 64  BILITOT 0.2* <0.2*  PROT 9.0* 5.6*  ALBUMIN 4.4 2.7*   No results for input(s): LIPASE, AMYLASE in the last 168 hours. No results for input(s): AMMONIA in the last 168 hours. CBC:  Recent Labs Lab 09/30/14 0806 10/01/14 0438  WBC 9.8 5.5  NEUTROABS 6.8  --   HGB 14.2 10.2*  HCT 41.3 30.7*  MCV 81.6 83.2  PLT 392 223   Cardiac Enzymes: No results for input(s): CKTOTAL, CKMB, CKMBINDEX, TROPONINI in the last 168 hours. BNP (last 3 results)  Recent Labs  05/13/14 1205  PROBNP 71.8   CBG: No results for input(s): GLUCAP in the last 168 hours.  Recent Results (from the past 240 hour(s))  Clostridium Difficile by PCR     Status: None   Collection Time: 09/30/14  2:20 PM  Result Value Ref Range Status   C difficile by pcr NEGATIVE NEGATIVE Final    Comment: Performed at [facility]     Studies: No results found.  Scheduled Meds: . calcium citrate-vitamin D  1 tablet Oral TID  . enoxaparin (LOVENOX) injection  40 mg Subcutaneous Q24H  . lactose free nutrition  237 mL Oral BID BM  . potassium chloride  40 mEq Oral Q4H  . sodium bicarbonate  650 mg Oral BID  . sodium chloride  3 mL Intravenous Q12H   Continuous Infusions: . sodium chloride 125 mL/hr at 10/01/14 [id number]    Principal Problem:   High output ileostomy Active Problems:   Hypokalemia   Adenocarcinoma of colon   Acute renal failure   Dehydration   Hyponatremia   Metabolic acidosis   Acute nontraumatic kidney injury   FAP (familial adenomatous  polyposis)   Nephrolithiasis   Hypocalcemia    Time spent: [place] MD [facility] Pager [phone]. If 7PM-7AM, please contact night-coverage at [email or web address], password [facility] 10/01/2014, 10:59 AM  LOS: 1 day

## 2014-10-01 NOTE — Progress Notes (Signed)
INITIAL NUTRITION ASSESSMENT  DOCUMENTATION CODES Per approved criteria  -Not Applicable   INTERVENTION: -Continue with Boost Plus BID -Encouraged intake of fluids, and reviewed foods to assist in thickening stool -Provided pt with "Ileostomy Nutrition Therapy" education handout from [employer]; will review w/pt as warranted -RD to continue to monitor  NUTRITION DIAGNOSIS: Altered GI function related to hx of colon cancer and ileostomy as evidenced by high ostomy output.   Goal: Pt to meet >/= 90% of their estimated nutrition needs    Monitor:  Supplement tolerance, total protein/energy intake, labs, weights, GI profile, education needs  Reason for Assessment: MST  23 y.o. female  Admitting Dx: High output ileostomy  ASSESSMENT: Cindy [name] is a 23 y.o. female  With history of iron deficiency anemia, history of FAP, locally advanced colon cancer and subtotal colectomy, s/p completion proctectomy and revision end ileostomy for FAP with lysis of abdominal adhesions 9/1/2015per Dr. Morton Stall at [facility]. Patient subsequently developed a perineal abscess in the postoperative period and underwent I and D of perineal abscess in 9 /16/2015. Patient also with a history of nephrolithiasis, GERD recently treated with amoxicillin per patient for UTI proximally 2 weeks prior to admission, who presents to the ED with a one-day history of increased ostomy output.   -Pt denied any recent changes in appetite or weight. Reported that she eats small snacks and meals constantly throughout the day. Has Boost Plus ordered, and was consuming supplement; however pt reported she does not typically consume supplements at home. Denied any difficulty tolerating milk or milk products -Pt had question regarding hydration and foods to assist in decreasing ostomy output. Has had to empty bag on an hourly basis. -Encouraged frequent hydration and use of Gatorade and/or Pedialyte for  electrolyte replenishment -Reviewed increased intake of bananas, rice, applesauce, toast, potatoes, and cheese for foods with soluble fiber content -Provided pt with nutrition education handout with further recommendations. Informed pt to review handout, and to contact RD with additional questions or concerns  Height: Ht Readings from Last 1 Encounters:  09/30/14 5\' 1"  (1.549 m)    Weight: Wt Readings from Last 1 Encounters:  10/01/14 110 lb 14.3 oz (50.3 kg)    Ideal Body Weight: 105 lb  % Ideal Body Weight: 105%  Wt Readings from Last 10 Encounters:  10/01/14 110 lb 14.3 oz (50.3 kg)  09/08/14 105 lb 8 oz (47.854 kg)  07/11/14 105 lb 6.4 oz (47.809 kg)  02/16/14 105 lb 6 oz (47.798 kg)  01/28/14 107 lb (48.535 kg)  01/19/14 107 lb (48.535 kg)  12/25/13 97 lb (43.999 kg)  11/18/13 99 lb (44.906 kg)  11/14/13 96 lb (43.545 kg)  09/23/13 93 lb 4.1 oz (42.3 kg)    Usual Body Weight: 100-110 lbs per previous medical records  % Usual Body Weight: 100%  BMI:  Body mass index is 20.96 kg/(m^2).  Estimated Nutritional Needs: Kcal: 1400-1500 Protein: 55-65 gram Fluid: >/= 1700 ml daily  Skin: WDL  Diet Order: Diet regular  EDUCATION NEEDS: -Education needs addressed   Intake/Output Summary (Last 24 hours) at 10/01/14 1456 Last data filed at 10/01/14 0600  Gross per 24 hour  Intake 2345.83 ml  Output      0 ml  Net 2345.83 ml    Last BM: 11/05   Labs:   Recent Labs Lab 09/30/14 0806 09/30/14 1341 10/01/14 0438  NA 128*  --  142  K 3.1*  --  3.0*  CL 91*  --  113*  CO2 16*  --  18*  BUN 31*  --  16  CREATININE 1.63*  --  1.19*  CALCIUM 9.6  --  7.9*  MG 2.5  --   --   PHOS  --  5.2*  --   GLUCOSE 86  --  95    CBG (last 3)  No results for input(s): GLUCAP in the last 72 hours.  Scheduled Meds: . calcium citrate-vitamin D  1 tablet Oral TID  . enoxaparin (LOVENOX) injection  40 mg Subcutaneous Q24H  . lactose free nutrition  237 mL Oral BID  BM  . sodium bicarbonate  650 mg Oral BID  . sodium chloride  3 mL Intravenous Q12H    Continuous Infusions: . sodium chloride 125 mL/hr at 10/01/14 [id number]    Past Medical History  Diagnosis Date  . UTI (lower urinary tract infection)   . Heart murmur   . Pneumonia 2008; 2012  . Complication of anesthesia     "I swallowed the anesthesia when appendix taken out; got pneumonia"  . Iron deficiency anemia   . History of blood transfusion 04/26/2013    "4 bags" (05/21/2013)  . Colon cancer 2014    s/p resection and ileostomy  . 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 FAP (familial adenomatous polyposis) 09/30/2014  . Nephrolithiasis 09/30/2014    Past Surgical History  Procedure Laterality Date  . Leg reconstruction using fascial flap  ~ 2009  . Still born baby  03/22/13    delivered vaginally @ ~ 24 weeks  . Appendectomy  2008  . Colonoscopy N/A 05/24/2013    Procedure: COLONOSCOPY;  Surgeon: Missy Sabins, MD;  Location: [place];  Service: Endoscopy;  Laterality: N/A;  . Colon surgery    . Ileostomy  06/26/2013  . I&d extremity Left 11/14/2013    Procedure: IRRIGATION AND DEBRIDEMENT LEFT LONG FINGER;  Surgeon: Tennis Must, MD;  Location: [place];  Service: Orthopedics;  Laterality: Left;    Atlee Abide MS RD [place] Clinical Dietitian [phone]

## 2014-10-02 LAB — CBC
HCT: 28.8 % — ABNORMAL LOW (ref 36.0–46.0)
Hemoglobin: 9.6 g/dL — ABNORMAL LOW (ref 12.0–15.0)
MCH: 27.8 pg (ref 26.0–34.0)
MCHC: 33.3 g/dL (ref 30.0–36.0)
MCV: 83.5 fL (ref 78.0–100.0)
Platelets: 205 10*3/uL (ref 150–400)
RBC: 3.45 MIL/uL — ABNORMAL LOW (ref 3.87–5.11)
RDW: 13.9 % (ref 11.5–15.5)
WBC: 4.3 10*3/uL (ref 4.0–10.5)

## 2014-10-02 LAB — BASIC METABOLIC PANEL
ANION GAP: 10 (ref 5–15)
BUN: 9 mg/dL (ref 6–23)
CALCIUM: 8.1 mg/dL — AB (ref 8.4–10.5)
CO2: 18 mEq/L — ABNORMAL LOW (ref 19–32)
CREATININE: 0.95 mg/dL (ref 0.50–1.10)
Chloride: 110 mEq/L (ref 96–112)
GFR, EST NON AFRICAN AMERICAN: 83 mL/min — AB (ref >90)
Glucose, Bld: 98 mg/dL (ref 70–99)
Potassium: 3.2 mEq/L — ABNORMAL LOW (ref 3.7–5.3)
Sodium: 138 mEq/L (ref 137–147)

## 2014-10-02 LAB — MAGNESIUM: MAGNESIUM: 1.6 mg/dL (ref 1.5–2.5)

## 2014-10-02 MED ORDER — POTASSIUM CHLORIDE CRYS ER 20 MEQ PO TBCR
40.0000 meq | EXTENDED_RELEASE_TABLET | ORAL | Status: AC
  Administered 2014-10-02 (×3): 40 meq via ORAL
  Filled 2014-10-02 (×3): qty 2

## 2014-10-02 MED ORDER — SODIUM BICARBONATE 650 MG PO TABS
1300.0000 mg | ORAL_TABLET | Freq: Two times a day (BID) | ORAL | Status: DC
  Administered 2014-10-02: 1300 mg via ORAL
  Filled 2014-10-02 (×2): qty 2

## 2014-10-02 MED ORDER — MAGNESIUM SULFATE 4 GM/100ML IV SOLN
4.0000 g | Freq: Once | INTRAVENOUS | Status: AC
  Administered 2014-10-02: 4 g via INTRAVENOUS
  Filled 2014-10-02: qty 100

## 2014-10-02 NOTE — Plan of Care (Signed)
Problem: Phase I Progression Outcomes Goal: OOB as tolerated unless otherwise ordered Outcome: Completed/Met Date Met:  10/02/14 Goal: Hemodynamically stable Outcome: Completed/Met Date Met:  10/02/14

## 2014-10-02 NOTE — Discharge Summary (Signed)
Physician Discharge Summary  KANNON GARVEY [id number] DOB: 18-Jun-1991 DOA: 09/30/2014  PCP: Vidal Schwalbe, MD  Admit date: 09/30/2014 Discharge date: 10/02/2014  Time spent: 65 minutes  Recommendations for Outpatient Follow-up:  1. Follow up with Dr Morton Stall at [facility] as scheduled. On follow-up patient's ostomy output will need to be reassessed. Patient will also need a comprehensive metabolic profile done to follow-up on electrolytes and renal function.  Discharge Diagnoses:  Principal Problem:   High output ileostomy Active Problems:   Hypokalemia   Adenocarcinoma of colon   Acute renal failure   Dehydration   Hyponatremia   Metabolic acidosis   Acute nontraumatic kidney injury   FAP (familial adenomatous polyposis)   Nephrolithiasis   Hypocalcemia   Discharge Condition: stable and improved.  Diet recommendation: Regular  Filed Weights   09/30/14 1312 10/01/14 0300 10/02/14 0515  Weight: 49.442 kg (109 lb) 50.3 kg (110 lb 14.3 oz) 51.7 kg (113 lb 15.7 oz)    History of present illness:  Cindy [name] is a 23 y.o. female  With history of iron deficiency anemia, history of FAP, locally advanced colon cancer and subtotal colectomy, s/p completion proctectomy and revision end ileostomy for FAP with lysis of abdominal adhesions 9/1/2015per Dr. Morton Stall at [facility]. Patient subsequently developed a perineal abscess in the postoperative period and underwent I and D of perineal abscess in 9 /16/2015. Patient also with a history of nephrolithiasis, GERD recently treated with amoxicillin per patient for UTI  2 weeks prior to admission, who presented to the ED with a one-day history of increased ostomy output. Patient stated she has had to empty her ostomy bag on a hourly basis over the past day and felt she was dehydrated. Patient stated saw PCP one day prior to admission and her systolic blood pressure was noted to be in the 70s. Patient denied any chest pain, no shortness of  breath, no fever, no chills, no nausea, no vomiting, no abdominal pain, no dysuria, no melena, no hematemesis, no hematochezia. Patient did endorse some generalized weakness. Patient was seen in the emergency room where labs obtained showed a sodium of 128, potassium of 3.1, chloride of 91, bicarbonate of 16, BUN of 31 and a creatinine of 1.63. CBC was unremarkable. Urinalysis was nitrite negative leukocytes negative. Urine pregnancy was negative.  [facility] were called to admit the patient for electrolyte abnormalities and increased ostomy output.  Hospital Course:  #1. High ostomy output Unknown etiology. Patient was admitted placed on IV fluids and a GI panel as well as a C. Difficile PCR was obtained. C. Difficile PCR was negative. GI panel which was obtained came back negative. Patient was hydrated with IV fluids placed on supportive care. Patient was also seen by the ostomy nurse. Patient's high ostomy output improved daily during the hospitalization and had resolved by day of discharge. Patient is to follow-up with her general surgeon as outpatient as previously scheduled.   #2 hypokalemia/hyponatremia/hypocalcemia Secondary to GI losses. Patient was started on IV fluids urine sodium which was done was less than 15. Patient's hyponatremia resolved with IV fluids. Magnesium levels were checked and patient was given IV magnesium and her potassium repleted. Patient was noted to have a hypocalcemia as well patient was asymptomatic and was placed on oral calcium. Patient's corrected calcium on day of discharge was 9.1. Patient be discharged in stable and improved condition and will need outpatient follow-up.  #3 acute renal failure Secondary to prerenal azotemia. Urine sodium was less than  15. Patient was hydrated with IV fluids with resolution of acute renal failure by day of discharge.  #4 dehydration Patient noted to be dehydrated on admission. Patient was hydrated with IV fluids was  euvolemic by day of discharge.  #5 metabolic acidosis Secondary to GI losses. Repleted electrolytes. Improved with IVF and bicarbonate tablets. Outpatient follow up.  #6 history of FAP status post total colectomy with completion proctectomy and revision ileostomy Outpatient follow-up.   Procedures:  None  Consultations:  None  Discharge Exam: Filed Vitals:   10/02/14 1331  BP: 91/47  Pulse: 87  Temp: 97.5 F (36.4 C)  Resp: 20    General: NAD Cardiovascular: RRR Respiratory: CTAB  Discharge Instructions You were cared for by a hospitalist during your hospital stay. If you have any questions about your discharge medications or the care you received while you were in the hospital after you are discharged, you can call the unit and asked to speak with the hospitalist on call if the hospitalist that took care of you is not available. Once you are discharged, your primary care physician will handle any further medical issues. Please note that NO REFILLS for any discharge medications will be authorized once you are discharged, as it is imperative that you return to your primary care physician (or establish a relationship with a primary care physician if you do not have one) for your aftercare needs so that they can reassess your need for medications and monitor your lab values.  Discharge Instructions    Diet general    Complete by:  As directed      Discharge instructions    Complete by:  As directed   Follow up with Dr Morton Stall as scheduled.     Increase activity slowly    Complete by:  As directed           Current Discharge Medication List    CONTINUE these medications which have NOT CHANGED   Details  EPINEPHrine 0.3 mg/0.3 mL IJ SOAJ injection Inject 0.3 mg into the muscle as needed (allergic reaction.).    lactose free nutrition (BOOST PLUS) LIQD Take 237 mLs by mouth 2 (two) times daily between meals. Qty: 237 mL, Refills: 0    Loperamide HCl (IMODIUM A-D) 1  MG/7.5ML LIQD Take 15 mLs (2 mg total) by mouth every 6 (six) hours as needed. Qty: 150 mL, Refills: 2    oxyCODONE-acetaminophen (PERCOCET/ROXICET) 5-325 MG per tablet Take 1-2 tablets by mouth every 6 (six) hours as needed for severe pain. Qty: 20 tablet, Refills: 0    potassium chloride (K-DUR) 10 MEQ tablet Take 1-2 tabs daily as needed for high ostomy output. Qty: 60 tablet, Refills: 0      STOP taking these medications     amoxicillin (AMOXIL) 400 MG/5ML suspension        Allergies  Allergen Reactions  . 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 Other Hives and Itching    Dove soap   Follow-up Information    Follow up with WATERS,GREGORY, MD.   Specialty:  Surgical Oncology   Why:  f/u as scheduled   Contact information:   [address] [place] [id number] [phone]        The results of significant diagnostics from this hospitalization (including imaging, microbiology, ancillary and laboratory) are listed below for reference.    Significant Diagnostic Studies: Ct Abdomen Pelvis Wo Contrast  09/07/2014   CLINICAL DATA:  Epigastric abdominal pain with  nausea and vomiting. History of colon cancer, currently in remission. Initial encounter.  EXAM: CT ABDOMEN AND PELVIS WITHOUT CONTRAST  TECHNIQUE: Multidetector CT imaging of the abdomen and pelvis was performed following the standard protocol without IV contrast.  COMPARISON:  CT abdomen pelvis - 06/27/2014  FINDINGS: The lack of intravenous contrast limits the ability to evaluate solid abdominal organs.  The previously identified partially obstructing stone within the left UVJ has been passed in the interval. Residual bilateral nonobstructing renal stones are grossly unchanged with dominant right-sided renal stone again measuring approximately 0.4 cm in diameter (image 32, series 2) and dominant left-sided renal stones measuring approximately 0.3 cm (images 29 and 33). No renal stones are seen  along the expected course of either ureter or the urinary bladder. Normal noncontrast appearance of the urinary bladder given degree distention. Several phleboliths overlie the lower pelvis bilaterally. No urinary obstruction or perinephric stranding. Normal noncontrast appearance of the bilateral adrenal glands, pancreas and spleen.  Normal hepatic contour. There is a minimal amount of focal fatty infiltration adjacent to the fissure for ligamentum teres. Normal noncontrast appearance of the gallbladder. No definite radiopaque gallstones.  The patient has a right lower quadrant ostomy. There are no dilated loops of upstream bowel to suggest enteric obstruction. No pneumoperitoneum, pneumatosis or portal venous gas.  Normal caliber the abdominal aorta. No bulky retroperitoneal, mesenteric, pelvic or inguinal lymphadenopathy on this noncontrast examination. There is a minimal amount of physiologic fluid within the endometrial canal. No discrete adnexal lesions on this noncontrast examination. No free fluid in the pelvic cul-de-sac.  Limited visualization of lower thorax is negative for focal airspace opacity or pleural effusion.  No acute or aggressive osseus abnormalities.  Regional soft tissues appear normal.  IMPRESSION: 1. Interval passage of previously identified partially obstructing left UVJ stone. 2. Residual bilateral nonobstructing renal stones as above. No evidence urinary obstruction. 3. Unchanged appearance of right lower quadrant ostomy without evidence of enteric obstruction.   Electronically Signed   By: Sandi Mariscal M.D.   On: 09/07/2014 21:31    Microbiology: Recent Results (from the past 240 hour(s))  Urine culture     Status: None (Preliminary result)   Collection Time: 09/30/14  2:00 PM  Result Value Ref Range Status   Specimen Description URINE, RANDOM  Final   Special Requests NONE  Final   Culture  Setup Time   Final    10/01/2014 00:47 Performed at [place]   Final    >=100,000 COLONIES/ML Performed at [facility]    Culture   Final    [place] Performed at [facility]    Report Status PENDING  Incomplete  Clostridium Difficile by PCR     Status: None   Collection Time: 09/30/14  2:20 PM  Result Value Ref Range Status   C difficile by pcr NEGATIVE NEGATIVE Final    Comment: Performed at [facility]: Basic Metabolic Panel:  Recent Labs Lab 09/30/14 0806 09/30/14 1341 10/01/14 0438 10/02/14 0429  NA 128*  --  142 138  K 3.1*  --  3.0* 3.2*  CL 91*  --  113* 110  CO2 16*  --  18* 18*  GLUCOSE 86  --  95 98  BUN 31*  --  16 9  CREATININE 1.63*  --  1.19* 0.95  CALCIUM 9.6  --  7.9* 8.1*  MG 2.5  --   --  1.6  PHOS  --  5.2*  --   --    Liver Function Tests:  Recent Labs Lab 09/30/14 0806 10/01/14 0438  AST 16 11  ALT 12 7  ALKPHOS 102 64  BILITOT 0.2* <0.2*  PROT 9.0* 5.6*  ALBUMIN 4.4 2.7*   No results for input(s): LIPASE, AMYLASE in the last 168 hours. No results for input(s): AMMONIA in the last 168 hours. CBC:  Recent Labs Lab 09/30/14 0806 10/01/14 0438 10/02/14 0429  WBC 9.8 5.5 4.3  NEUTROABS 6.8  --   --   HGB 14.2 10.2* 9.6*  HCT 41.3 30.7* 28.8*  MCV 81.6 83.2 83.5  PLT 392 223 205   Cardiac Enzymes: No results for input(s): CKTOTAL, CKMB, CKMBINDEX, TROPONINI in the last 168 hours. BNP: BNP (last 3 results)  Recent Labs  05/13/14 1205  PROBNP 71.8   CBG: No results for input(s): GLUCAP in the last 168 hours.     SignedIrine Seal MD [facility] 10/02/2014, 2:38 PM

## 2014-10-02 NOTE — Plan of Care (Signed)
Problem: Phase I Progression Outcomes Goal: OOB as tolerated unless otherwise ordered Outcome: Adequate for Discharge

## 2014-10-03 LAB — URINE CULTURE: Colony Count: >=100000

## 2014-12-06 ENCOUNTER — Encounter (HOSPITAL_COMMUNITY): Payer: Self-pay | Admitting: Emergency Medicine

## 2014-12-06 ENCOUNTER — Inpatient Hospital Stay (HOSPITAL_COMMUNITY)
Admission: EM | Admit: 2014-12-06 | Discharge: 2014-12-09 | DRG: 872 | Disposition: A | Discharge status: Home / Self Care | Payer: 59 | Attending: Internal Medicine | Admitting: Internal Medicine

## 2014-12-06 DIAGNOSIS — E872 Acidosis: Secondary | ICD-10-CM | POA: Diagnosis present

## 2014-12-06 DIAGNOSIS — D126 Benign neoplasm of colon, unspecified: Secondary | ICD-10-CM | POA: Diagnosis present

## 2014-12-06 DIAGNOSIS — Z85038 Personal history of other malignant neoplasm of large intestine: Secondary | ICD-10-CM | POA: Diagnosis not present

## 2014-12-06 DIAGNOSIS — Z87442 Personal history of urinary calculi: Secondary | ICD-10-CM | POA: Diagnosis not present

## 2014-12-06 DIAGNOSIS — Z87440 Personal history of urinary (tract) infections: Secondary | ICD-10-CM | POA: Diagnosis not present

## 2014-12-06 DIAGNOSIS — D509 Iron deficiency anemia, unspecified: Secondary | ICD-10-CM | POA: Diagnosis present

## 2014-12-06 DIAGNOSIS — E876 Hypokalemia: Secondary | ICD-10-CM | POA: Diagnosis present

## 2014-12-06 DIAGNOSIS — N179 Acute kidney failure, unspecified: Secondary | ICD-10-CM | POA: Diagnosis present

## 2014-12-06 DIAGNOSIS — Z91018 Allergy to other foods: Secondary | ICD-10-CM

## 2014-12-06 DIAGNOSIS — R011 Cardiac murmur, unspecified: Secondary | ICD-10-CM | POA: Diagnosis present

## 2014-12-06 DIAGNOSIS — A419 Sepsis, unspecified organism: Secondary | ICD-10-CM | POA: Diagnosis present

## 2014-12-06 DIAGNOSIS — Z91048 Other nonmedicinal substance allergy status: Secondary | ICD-10-CM | POA: Diagnosis not present

## 2014-12-06 DIAGNOSIS — E871 Hypo-osmolality and hyponatremia: Secondary | ICD-10-CM | POA: Diagnosis present

## 2014-12-06 DIAGNOSIS — Z932 Ileostomy status: Secondary | ICD-10-CM | POA: Diagnosis not present

## 2014-12-06 DIAGNOSIS — Z9049 Acquired absence of other specified parts of digestive tract: Secondary | ICD-10-CM | POA: Diagnosis present

## 2014-12-06 DIAGNOSIS — N12 Tubulo-interstitial nephritis, not specified as acute or chronic: Secondary | ICD-10-CM | POA: Diagnosis present

## 2014-12-06 LAB — CBC WITH DIFFERENTIAL/PLATELET
BASOS ABS: 0 10*3/uL (ref 0.0–0.1)
Basophils Relative: 0 % (ref 0–1)
EOS ABS: 0 10*3/uL (ref 0.0–0.7)
Eosinophils Relative: 0 % (ref 0–5)
HCT: 34.2 % — ABNORMAL LOW (ref 36.0–46.0)
HEMOGLOBIN: 11.7 g/dL — AB (ref 12.0–15.0)
Lymphocytes Relative: 20 % (ref 12–46)
Lymphs Abs: 2.7 10*3/uL (ref 0.7–4.0)
MCH: 26.8 pg (ref 26.0–34.0)
MCHC: 34.2 g/dL (ref 30.0–36.0)
MCV: 78.4 fL (ref 78.0–100.0)
MONO ABS: 0.9 10*3/uL (ref 0.1–1.0)
Monocytes Relative: 7 % (ref 3–12)
NEUTROS PCT: 73 % (ref 43–77)
Neutro Abs: 10 10*3/uL — ABNORMAL HIGH (ref 1.7–7.7)
Platelets: 354 10*3/uL (ref 150–400)
RBC: 4.36 MIL/uL (ref 3.87–5.11)
RDW: 13.4 % (ref 11.5–15.5)
WBC: 13.7 10*3/uL — AB (ref 4.0–10.5)

## 2014-12-06 LAB — OSMOLALITY: OSMOLALITY: 270 mosm/kg — AB (ref 275–300)

## 2014-12-06 LAB — BASIC METABOLIC PANEL
Anion gap: 12 (ref 5–15)
Anion gap: 10 (ref 5–15)
Anion gap: 7 (ref 5–15)
BUN: 26 mg/dL — AB (ref 6–23)
BUN: 22 mg/dL (ref 6–23)
BUN: 19 mg/dL (ref 6–23)
CALCIUM: 8.5 mg/dL (ref 8.4–10.5)
CALCIUM: 8.5 mg/dL (ref 8.4–10.5)
CHLORIDE: 107 meq/L (ref 96–112)
CHLORIDE: 109 meq/L (ref 96–112)
CO2: 15 mmol/L — ABNORMAL LOW (ref 19–32)
CO2: 15 mmol/L — AB (ref 19–32)
CO2: 16 mmol/L — ABNORMAL LOW (ref 19–32)
Calcium: 8.5 mg/dL (ref 8.4–10.5)
Chloride: 96 mEq/L (ref 96–112)
Creatinine, Ser: 1.58 mg/dL — ABNORMAL HIGH (ref 0.50–1.10)
Creatinine, Ser: 1.28 mg/dL — ABNORMAL HIGH (ref 0.50–1.10)
Creatinine, Ser: 1.3 mg/dL — ABNORMAL HIGH (ref 0.50–1.10)
GFR calc Af Amer: 52 mL/min — ABNORMAL LOW (ref >90)
GFR calc Af Amer: 67 mL/min — ABNORMAL LOW (ref >90)
GFR calc non Af Amer: 58 mL/min — ABNORMAL LOW (ref >90)
GFR calc non Af Amer: 57 mL/min — ABNORMAL LOW (ref >90)
GFR, EST AFRICAN AMERICAN: 65 mL/min — AB (ref >90)
GFR, EST NON AFRICAN AMERICAN: 45 mL/min — AB (ref >90)
GLUCOSE: 105 mg/dL — AB (ref 70–99)
Glucose, Bld: 141 mg/dL — ABNORMAL HIGH (ref 70–99)
Glucose, Bld: 71 mg/dL (ref 70–99)
POTASSIUM: 3.7 mmol/L (ref 3.5–5.1)
Potassium: 2.3 mmol/L — CL (ref 3.5–5.1)
Potassium: 2.9 mmol/L — ABNORMAL LOW (ref 3.5–5.1)
SODIUM: 132 mmol/L — AB (ref 135–145)
SODIUM: 132 mmol/L — AB (ref 135–145)
Sodium: 123 mmol/L — ABNORMAL LOW (ref 135–145)

## 2014-12-06 LAB — URINE MICROSCOPIC-ADD ON

## 2014-12-06 LAB — URINALYSIS, ROUTINE W REFLEX MICROSCOPIC
BILIRUBIN URINE: NEGATIVE
GLUCOSE, UA: NEGATIVE mg/dL
KETONES UR: NEGATIVE mg/dL
NITRITE: POSITIVE — AB
PH: 6 (ref 5.0–8.0)
Protein, ur: 30 mg/dL — AB
Specific Gravity, Urine: 1.012 (ref 1.005–1.030)
Urobilinogen, UA: 0.2 mg/dL (ref 0.0–1.0)

## 2014-12-06 LAB — MRSA PCR SCREENING: MRSA BY PCR: NEGATIVE

## 2014-12-06 LAB — CBC
HEMATOCRIT: 31.1 % — AB (ref 36.0–46.0)
HEMOGLOBIN: 10.6 g/dL — AB (ref 12.0–15.0)
MCH: 27.4 pg (ref 26.0–34.0)
MCHC: 34.1 g/dL (ref 30.0–36.0)
MCV: 80.4 fL (ref 78.0–100.0)
PLATELETS: 260 10*3/uL (ref 150–400)
RBC: 3.87 MIL/uL (ref 3.87–5.11)
RDW: 13.7 % (ref 11.5–15.5)
WBC: 9 10*3/uL (ref 4.0–10.5)

## 2014-12-06 LAB — TSH: TSH: 0.843 u[IU]/mL (ref 0.350–4.500)

## 2014-12-06 LAB — CORTISOL: Cortisol, Plasma: 12 ug/dL

## 2014-12-06 LAB — PREGNANCY, URINE: Preg Test, Ur: NEGATIVE

## 2014-12-06 LAB — OSMOLALITY, URINE: Osmolality, Ur: 305 mOsm/kg — ABNORMAL LOW (ref 390–1090)

## 2014-12-06 LAB — MAGNESIUM: Magnesium: 2.2 mg/dL (ref 1.5–2.5)

## 2014-12-06 MED ORDER — POTASSIUM CHLORIDE IN NACL 40-0.9 MEQ/L-% IV SOLN
INTRAVENOUS | Status: DC
  Administered 2014-12-06 – 2014-12-07 (×2): 100 mL/h via INTRAVENOUS
  Filled 2014-12-06 (×7): qty 1000

## 2014-12-06 MED ORDER — HYDROMORPHONE HCL 1 MG/ML IJ SOLN
0.5000 mg | INTRAMUSCULAR | Status: DC | PRN
  Administered 2014-12-06 – 2014-12-07 (×3): 0.5 mg via INTRAVENOUS
  Filled 2014-12-06 (×4): qty 1

## 2014-12-06 MED ORDER — ONDANSETRON HCL 4 MG/2ML IJ SOLN
4.0000 mg | Freq: Three times a day (TID) | INTRAMUSCULAR | Status: DC | PRN
Start: 2014-12-06 — End: 2014-12-06

## 2014-12-06 MED ORDER — POTASSIUM CHLORIDE 10 MEQ/100ML IV SOLN
10.0000 meq | INTRAVENOUS | Status: DC
  Administered 2014-12-06: 10 meq via INTRAVENOUS
  Filled 2014-12-06: qty 100

## 2014-12-06 MED ORDER — LOPERAMIDE HCL 2 MG PO CAPS: 2.0000 mg | ORAL_CAPSULE | Freq: Four times a day (QID) | ORAL | Status: DC | PRN

## 2014-12-06 MED ORDER — HYDROMORPHONE HCL 1 MG/ML IJ SOLN
1.0000 mg | Freq: Once | INTRAMUSCULAR | Status: AC
  Administered 2014-12-06: 1 mg via INTRAVENOUS
  Filled 2014-12-06: qty 1

## 2014-12-06 MED ORDER — HYDROMORPHONE HCL 1 MG/ML IJ SOLN: 1.0000 mg | INTRAMUSCULAR | Status: DC | PRN

## 2014-12-06 MED ORDER — SODIUM CHLORIDE 0.9 % IV BOLUS (SEPSIS)
1000.0000 mL | Freq: Once | INTRAVENOUS | Status: AC
  Administered 2014-12-06: 1000 mL via INTRAVENOUS

## 2014-12-06 MED ORDER — POTASSIUM CHLORIDE IN NACL 40-0.9 MEQ/L-% IV SOLN
INTRAVENOUS | Status: DC
  Administered 2014-12-06: 75 mL/h via INTRAVENOUS
  Filled 2014-12-06: qty 1000

## 2014-12-06 MED ORDER — DEXTROSE 5 % IV SOLN
1.0000 g | INTRAVENOUS | Status: DC
  Administered 2014-12-06 – 2014-12-09 (×4): 1 g via INTRAVENOUS
  Filled 2014-12-06 (×4): qty 10

## 2014-12-06 MED ORDER — POTASSIUM CHLORIDE 20 MEQ/15ML (10%) PO SOLN
40.0000 meq | Freq: Two times a day (BID) | ORAL | Status: DC
  Administered 2014-12-06 – 2014-12-07 (×3): 40 meq via ORAL
  Filled 2014-12-06 (×5): qty 30

## 2014-12-06 MED ORDER — HYDROMORPHONE HCL 1 MG/ML IJ SOLN: 1.0000 mg | Freq: Once | INTRAMUSCULAR | Status: AC

## 2014-12-06 MED ORDER — ACETAMINOPHEN 325 MG PO TABS
650.0000 mg | ORAL_TABLET | Freq: Four times a day (QID) | ORAL | Status: DC | PRN
Start: 2014-12-06 — End: 2014-12-09
  Administered 2014-12-07: 650 mg via ORAL
  Filled 2014-12-06: qty 2

## 2014-12-06 MED ORDER — SODIUM CHLORIDE 0.9 % IJ SOLN
3.0000 mL | Freq: Two times a day (BID) | INTRAMUSCULAR | Status: DC
  Administered 2014-12-06 – 2014-12-08 (×6): 3 mL via INTRAVENOUS

## 2014-12-06 MED ORDER — ONDANSETRON HCL 4 MG/2ML IJ SOLN
4.0000 mg | Freq: Once | INTRAMUSCULAR | Status: AC
  Administered 2014-12-06: 4 mg via INTRAVENOUS
  Filled 2014-12-06: qty 2

## 2014-12-06 MED ORDER — MORPHINE SULFATE 4 MG/ML IJ SOLN
4.0000 mg | Freq: Once | INTRAMUSCULAR | Status: AC
  Administered 2014-12-06: 4 mg via INTRAVENOUS
  Filled 2014-12-06: qty 1

## 2014-12-06 MED ORDER — POTASSIUM CHLORIDE 20 MEQ/15ML (10%) PO SOLN
40.0000 meq | Freq: Once | ORAL | Status: AC
  Administered 2014-12-06: 40 meq via ORAL
  Filled 2014-12-06: qty 30

## 2014-12-06 MED ORDER — ENOXAPARIN SODIUM 40 MG/0.4ML ~~LOC~~ SOLN
40.0000 mg | SUBCUTANEOUS | Status: DC
  Administered 2014-12-06 – 2014-12-09 (×4): 40 mg via SUBCUTANEOUS
  Filled 2014-12-06 (×4): qty 0.4

## 2014-12-06 MED ORDER — ONDANSETRON HCL 4 MG/2ML IJ SOLN
4.0000 mg | Freq: Four times a day (QID) | INTRAMUSCULAR | Status: DC | PRN
  Administered 2014-12-06 – 2014-12-08 (×3): 4 mg via INTRAVENOUS
  Filled 2014-12-06 (×3): qty 2

## 2014-12-06 MED ORDER — ACETAMINOPHEN 650 MG RE SUPP: 650.0000 mg | Freq: Four times a day (QID) | RECTAL | Status: DC | PRN

## 2014-12-06 MED ORDER — POTASSIUM CHLORIDE CRYS ER 20 MEQ PO TBCR: 40.0000 meq | EXTENDED_RELEASE_TABLET | Freq: Two times a day (BID) | ORAL | Status: DC

## 2014-12-06 MED ORDER — POTASSIUM CHLORIDE 10 MEQ/100ML IV SOLN: 10.0000 meq | INTRAVENOUS | Status: DC

## 2014-12-06 NOTE — H&P (Addendum)
Cindy [name] is an 24 y.o. female.    Pcp: Harlan Stains Chief Complaint: flank pain right HPI: 24 yo female with hx of uti, apparently c/o right sided flank pain starting on Saturday.  Pt has had n/v, but no bloody emesis.  denies fever, chills, dysuria, hematuria.  Pt came to ED due to pain and was noted to have hypokalemia, hyponatremia, and mild renal insufficiency.  Pt probably has pyelonephritis/uti clinically,  Awaiting ua .  Pt will be admitted for above issues.   Past Medical History  Diagnosis Date  . UTI (lower urinary tract infection)   . Heart murmur   . Pneumonia 2008; 2012  . Complication of anesthesia     "I swallowed the anesthesia when appendix taken out; got pneumonia"  . Iron deficiency anemia   . History of blood transfusion 04/26/2013    "4 bags" (05/21/2013)  . Colon cancer 2014    s/p resection and ileostomy  . 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 FAP (familial adenomatous polyposis) 09/30/2014  . Nephrolithiasis 09/30/2014    Past Surgical History  Procedure Laterality Date  . Leg reconstruction using fascial flap  ~ 2009  . Still born baby  03/22/13    delivered vaginally @ ~ 24 weeks  . Appendectomy  2008  . Colonoscopy N/A 05/24/2013    Procedure: COLONOSCOPY;  Surgeon: Missy Sabins, MD;  Location: [facility];  Service: Endoscopy;  Laterality: N/A;  . Colon surgery    . Ileostomy  06/26/2013  . I&d extremity Left 11/14/2013    Procedure: IRRIGATION AND DEBRIDEMENT LEFT LONG FINGER;  Surgeon: Tennis Must, MD;  Location: [facility];  Service: Orthopedics;  Laterality: Left;    Family History  Problem Relation Age of Onset  . Adopted: Yes  . Alcohol abuse Neg Hx   . Arthritis Neg Hx   . Asthma Neg Hx   . Birth defects Neg Hx   . Cancer Neg Hx   . COPD Neg Hx   . Depression Neg Hx   . Diabetes Neg Hx   . Drug abuse Neg Hx   . Early death Neg Hx   . Hearing loss Neg Hx   . Heart disease Neg Hx   .  Hyperlipidemia Neg Hx   . Hypertension Neg Hx   . Kidney disease Neg Hx   . Learning disabilities Neg Hx   . Mental illness Neg Hx   . Mental retardation Neg Hx   . Miscarriages / Stillbirths Neg Hx   . Stroke Neg Hx   . Vision loss Neg Hx   . ALS Mother    Social History:  reports that she has never smoked. She has never used smokeless tobacco. She reports that she does not drink alcohol or use illicit drugs.  Allergies:  Allergies  Allergen Reactions  . 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 Other Hives and Itching    Dove soap     (Not in a hospital admission)  Results for orders placed or performed during the hospital encounter of 12/06/14 (from the past 48 hour(s))  CBC with Differential     Status: Abnormal   Collection Time: 12/06/14  3:16 AM  Result Value Ref Range   WBC 13.7 (H) 4.0 - 10.5 K/uL   RBC 4.36 3.87 - 5.11 MIL/uL   Hemoglobin 11.7 (L) 12.0 - 15.0 g/dL   HCT 34.2 (L) 36.0 - 46.0 %  MCV 78.4 78.0 - 100.0 fL   MCH 26.8 26.0 - 34.0 pg   MCHC 34.2 30.0 - 36.0 g/dL   RDW 13.4 11.5 - 15.5 %   Platelets 354 150 - 400 K/uL   Neutrophils Relative % 73 43 - 77 %   Neutro Abs 10.0 (H) 1.7 - 7.7 K/uL   Lymphocytes Relative 20 12 - 46 %   Lymphs Abs 2.7 0.7 - 4.0 K/uL   Monocytes Relative 7 3 - 12 %   Monocytes Absolute 0.9 0.1 - 1.0 K/uL   Eosinophils Relative 0 0 - 5 %   Eosinophils Absolute 0.0 0.0 - 0.7 K/uL   Basophils Relative 0 0 - 1 %   Basophils Absolute 0.0 0.0 - 0.1 K/uL  Basic metabolic panel     Status: Abnormal   Collection Time: 12/06/14  3:16 AM  Result Value Ref Range   Sodium 123 (L) 135 - 145 mmol/L    Comment: Please note change in reference range.   Potassium 2.3 (LL) 3.5 - 5.1 mmol/L    Comment: Please note change in reference range. REPEATED TO VERIFY CRITICAL RESULT CALLED TO, READ BACK BY AND VERIFIED WITHWilly Eddy RN [id number] 12/06/14 A NAVARRO    Chloride 96 96 - 112 mEq/L   CO2 15 (L) 19 - 32 mmol/L   Glucose, Bld 141 (H) 70 - 99  mg/dL   BUN 26 (H) 6 - 23 mg/dL   Creatinine, Ser 1.58 (H) 0.50 - 1.10 mg/dL   Calcium 8.5 8.4 - 10.5 mg/dL   GFR calc non Af Amer 45 (L) >90 mL/min   GFR calc Af Amer 52 (L) >90 mL/min    Comment: (NOTE) The eGFR has been calculated using the CKD EPI equation. This calculation has not been validated in all clinical situations. eGFR's persistently <90 mL/min signify possible Chronic Kidney Disease.    Anion gap 12 5 - 15  Pregnancy, urine     Status: None   Collection Time: 12/06/14  5:05 AM  Result Value Ref Range   Preg Test, Ur NEGATIVE NEGATIVE    Comment:        THE SENSITIVITY OF THIS METHODOLOGY IS >20 mIU/mL.    No results found.  Review of Systems  Constitutional: Negative for fever, chills, weight loss, malaise/fatigue and diaphoresis.  HENT: Negative for congestion, ear discharge, ear pain, hearing loss, nosebleeds, sore throat and tinnitus.   Eyes: Negative for blurred vision, double vision, photophobia, pain, discharge and redness.  Respiratory: Negative for cough, hemoptysis, sputum production, shortness of breath, wheezing and stridor.   Cardiovascular: Negative for chest pain, palpitations, orthopnea, claudication, leg swelling and PND.  Gastrointestinal: Positive for nausea and vomiting. Negative for heartburn, abdominal pain, diarrhea, constipation, blood in stool and melena.  Genitourinary: Positive for flank pain. Negative for dysuria, urgency, frequency and hematuria.  Musculoskeletal: Negative for myalgias, back pain, joint pain, falls and neck pain.  Skin: Negative for itching and rash.  Neurological: Negative for dizziness, tingling, tremors, sensory change, speech change, focal weakness, seizures, loss of consciousness and headaches.  Endo/Heme/Allergies: Negative for environmental allergies and polydipsia. Does not bruise/bleed easily.  Psychiatric/Behavioral: Negative for depression, suicidal ideas, hallucinations, memory loss and substance abuse. The  patient is not nervous/anxious and does not have insomnia.     Blood pressure 110/57, pulse 117, temperature 98 F (36.7 C), temperature source Oral, resp. rate 18, height _0  (1.549 m), weight 49.896 kg (110 lb), last menstrual period 10/27/2014, SpO2  100 %. Physical Exam  Constitutional: She is oriented to person, place, and time. She appears well-developed and well-nourished.  HENT:  Head: Normocephalic and atraumatic.  Eyes: Conjunctivae and EOM are normal. Pupils are equal, round, and reactive to light. Right eye exhibits no discharge. Left eye exhibits no discharge. No scleral icterus.  Neck: Normal range of motion. Neck supple. No JVD present. No tracheal deviation present. No thyromegaly present.  Cardiovascular: Normal rate and regular rhythm.  Exam reveals no gallop and no friction rub.   No murmur heard. Respiratory: Effort normal and breath sounds normal. No respiratory distress. She has no wheezes. She has no rales. She exhibits no tenderness.  GI: Soft. Bowel sounds are normal. She exhibits no distension. There is no tenderness. There is no rebound and no guarding.  Musculoskeletal: Normal range of motion. She exhibits no edema or tenderness.  Lymphadenopathy:    She has no cervical adenopathy.  Neurological: She is alert and oriented to person, place, and time. She has normal reflexes. She displays normal reflexes. No cranial nerve deficit. She exhibits normal muscle tone. Coordination normal.  Skin: Skin is warm and dry. No rash noted. No erythema. No pallor.  Right cva tenderness  Psychiatric: She has a normal mood and affect. Her behavior is normal. Judgment and thought content normal.     Assessment/Plan Clinically pyelonephritis Rocephin 1gm iv qday (please start after ua) ua pending  ARF Check urine sodium, urine creatinine , urine eosinophils Check renal ultrasound  Hypokalemia Replete, check cmp later today  Hyponatremia Check tsh, cortisol , serum osm,  urine osm, urine sodium Hydrate gently with normal saline  Kallum Jorgensen 12/06/2014, 5:42 AM

## 2014-12-06 NOTE — ED Notes (Signed)
Pt is unable to urinate at this time. 

## 2014-12-06 NOTE — ED Provider Notes (Signed)
CSN: [id number]     Arrival date & time 12/06/14  0203 History   First MD Initiated Contact with Patient 12/06/14 0214     Chief Complaint  Patient presents with  . 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 The pain is located in the right flank and does not radiate. The pain is described as aching and severe. The pain started gradually and progressively worsened since the onset. No alleviating/aggravating factors. The patient has tried nothing for symptoms without relief. Associated symptoms include intractable nausea and vomiting. Patient denies fever, headache, diarrhea, chest pain, SOB, dysuria, constipation, abnormal vaginal bleeding/discharge.      Past Medical History  Diagnosis Date  . UTI (lower urinary tract infection)   . Heart murmur   . Pneumonia 2008; 2012  . Complication of anesthesia     "I swallowed the anesthesia when appendix taken out; got pneumonia"  . Iron deficiency anemia   . History of blood transfusion 04/26/2013    "4 bags" (05/21/2013)  . Colon cancer 2014    s/p resection and ileostomy  . 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 FAP (familial adenomatous polyposis) 09/30/2014  . Nephrolithiasis 09/30/2014   Past Surgical History  Procedure Laterality Date  . Leg reconstruction using fascial flap  ~ 2009  . Still born baby  03/22/13    delivered vaginally @ ~ 24 weeks  . Appendectomy  2008  . Colonoscopy N/A 05/24/2013    Procedure: COLONOSCOPY;  Surgeon: Missy Sabins, MD;  Location: [place];  Service: Endoscopy;  Laterality: N/A;  . Colon surgery    . Ileostomy  06/26/2013  . I&d extremity Left 11/14/2013    Procedure: IRRIGATION AND DEBRIDEMENT LEFT LONG  FINGER;  Surgeon: Tennis Must, MD;  Location: [place];  Service: Orthopedics;  Laterality: Left;   Family History  Problem Relation Age of Onset  . Adopted: Yes  . Alcohol abuse Neg Hx   . Arthritis Neg Hx   . Asthma Neg Hx   . Birth defects Neg Hx   . Cancer Neg Hx   . COPD Neg Hx   . Depression Neg Hx   . Diabetes Neg Hx   . Drug abuse Neg Hx   . Early death Neg Hx   . Hearing loss Neg Hx   . Heart disease Neg Hx   . Hyperlipidemia Neg Hx   . Hypertension Neg Hx   . Kidney disease Neg Hx   . Learning disabilities Neg Hx   . Mental illness Neg Hx   . Mental retardation Neg Hx   . Miscarriages / Stillbirths Neg Hx   . Stroke Neg Hx   . Vision loss Neg Hx   . ALS Mother    History  Substance Use Topics  . Smoking status: Never Smoker   . Smokeless tobacco: Never Used  . Alcohol Use: No   OB History    Gravida Para Term Preterm AB TAB SAB Ectopic Multiple Living   1 1  1            Review of Systems  Constitutional: Negative for fever, chills and fatigue.  HENT: Negative for trouble swallowing.   Eyes: Negative for visual disturbance.  Cardiovascular: Negative for chest pain and palpitations.  Gastrointestinal: Positive for nausea and vomiting. Negative for abdominal pain and diarrhea.  Genitourinary: Positive for flank pain. Negative for dysuria and difficulty urinating.  Musculoskeletal: Negative for arthralgias and neck pain.  Skin: Negative for color change.  Neurological: Negative for dizziness and weakness.  Psychiatric/Behavioral: Negative for dysphoric mood.      Allergies  Orange fruit and Other  Home Medications   Prior to Admission medications   Medication Sig Start Date End Date Taking? Authorizing Provider  Loperamide HCl (IMODIUM A-D) 1 MG/7.5ML LIQD Take 15 mLs (2 mg total) by mouth every 6 (six) hours as needed. 07/12/14  Yes Christina P Rama, MD  EPINEPHrine 0.3 mg/0.3 mL IJ SOAJ injection Inject 0.3 mg into the muscle as  needed (allergic reaction.). 08/10/13   Joanell Rising, MD  lactose free nutrition (BOOST PLUS) LIQD Take 237 mLs by mouth 2 (two) times daily between meals. Patient not taking: Reported on 12/06/2014 09/09/14   Robbie Lis, MD  oxyCODONE-acetaminophen (PERCOCET/ROXICET) 5-325 MG per tablet Take 1-2 tablets by mouth every 6 (six) hours as needed for severe pain. Patient not taking: Reported on 12/06/2014 06/27/14   Hyman Bible, PA-C  potassium chloride (K-DUR) 10 MEQ tablet Take 1-2 tabs daily as needed for high ostomy output. Patient not taking: Reported on 12/06/2014 07/12/14   Venetia Maxon Rama, MD   BP 110/57 mmHg  Pulse 117  Temp(Src) 98 F (36.7 C) (Oral)  Resp 18  Ht 5\' 1"  (1.549 m)  Wt 110 lb (49.896 kg)  BMI 20.80 kg/m2  SpO2 100%  LMP 10/27/2014 Physical Exam  Constitutional: She is oriented to person, place, and time. She appears well-developed and well-nourished. No distress.  Patient vomiting  HENT:  Head: Normocephalic and atraumatic.  Eyes: Conjunctivae and EOM are normal.  Neck: Normal range of motion.  Cardiovascular: Regular rhythm.  Exam reveals no gallop and no friction rub.   No murmur heard. tachycardic  Pulmonary/Chest: Effort normal and breath sounds normal. She has no wheezes. She has no rales. She exhibits no tenderness.  Abdominal: Soft. She exhibits no distension. There is no tenderness. There is no rebound and no guarding.  Colostomy bag noted.   Genitourinary:  Right CVA tenderness.   Musculoskeletal: Normal range of motion.  Neurological: She is alert and oriented to person, place, and time. Coordination normal.  Speech is goal-oriented. Moves limbs without ataxia.   Skin: Skin is warm and dry.  Psychiatric: She has a normal mood and affect. Her behavior is normal.  Nursing note and vitals reviewed.   ED Course  Procedures (including critical care time) Labs Review Labs Reviewed  CBC WITH DIFFERENTIAL - Abnormal; Notable for the following:     WBC 13.7 (*)    Hemoglobin 11.7 (*)    HCT Cindy.2 (*)    Neutro Abs 10.0 (*)    All other components within normal limits  BASIC METABOLIC PANEL - Abnormal; Notable for the following:    Sodium 123 (*)    Potassium 2.3 (*)    CO2 15 (*)    Glucose, Bld 141 (*)    BUN 26 (*)    Creatinine, Ser 1.58 (*)    GFR calc non Af Amer 45 (*)    GFR calc Af Amer 52 (*)    All other components within normal limits  URINALYSIS, ROUTINE W REFLEX MICROSCOPIC  PREGNANCY, URINE   CRITICAL CARE Performed by: Alvina Chou   Total  critical care time: 30 min  Critical care time was exclusive of separately billable procedures and treating other patients.  Critical care was necessary to treat or prevent imminent or life-threatening deterioration.  Critical care was time spent personally by me on the following activities: development of treatment plan with patient and/or surrogate as well as nursing, discussions with consultants, evaluation of patient's response to treatment, examination of patient, obtaining history from patient or surrogate, ordering and performing treatments and interventions, ordering and review of laboratory studies, ordering and review of radiographic studies, pulse oximetry and re-evaluation of patient's condition.   Imaging Review No results found.   EKG Interpretation   Date/Time:  Sunday December 06 2014 04:40:56 EST Ventricular Rate:  101 PR Interval:  179 QRS Duration: 91 QT Interval:  360 QTC Calculation: 467 R Axis:   77 Text Interpretation:  Sinus tachycardia Borderline T abnormalities,  inferior leads Since last tracing rate faster 13 May 2014 Confirmed by  KNAPP  MD-I, IVA ([id number]) on 12/06/2014 4:53:46 AM      MDM   Final diagnoses:  AKI (acute kidney injury)  Hypokalemia  Hyponatremia  Pyelonephritis    4:23 AM Patient's labs show elevated WBC, hyponatremia, hypokalemia and elevated creatinine. Urinalysis  Pending. Patient will have fluids  and receiving pain medication at this time.   5:17 AM Patient's EKG shows borderline T abnormalities and possible U waves. Urinalysis still pending.    Alvina Chou, PA-C 12/06/14 2147  Janice Norrie, MD 12/07/14 [phone]

## 2014-12-06 NOTE — ED Notes (Signed)
Admitting MD at bedside.

## 2014-12-06 NOTE — ED Provider Notes (Signed)
Patient states she started vomiting after midnight with multiple episodes. She reports right-sided flank pain.  Patient is awake and alert. She looks like she feels bad. Her tongue is dry. She is currently not vomiting.  Medical screening examination/treatment/procedure(s) were conducted as a shared visit with non-physician practitioner(s) and myself.  I personally evaluated the patient during the encounter.   EKG Interpretation   Date/Time:  Sunday December 06 2014 04:40:56 EST Ventricular Rate:  101 PR Interval:  179 QRS Duration: 91 QT Interval:  360 QTC Calculation: 467 R Axis:   77 Text Interpretation:  Sinus tachycardia Borderline T abnormalities,  inferior leads Since last tracing rate faster 13 May 2014 Confirmed by  [place]  MD-I, Jnaya Butrick ([id number]) on 12/06/2014 4:53:46 AM       Rolland Porter, MD, Alanson Aly, MD 12/06/14 [phone]

## 2014-12-06 NOTE — ED Notes (Signed)
Pt arrived to the ED with a complaint of right sided flank pain.  Pt states the pain started an hour and a half ago.  Pt has a hx of renal disorder.  Pt states pain radiates from back to abdomen.

## 2014-12-06 NOTE — Progress Notes (Signed)
Pt admitted after midnight with right flank pain, s/w pyelonephritis. Started on Rocephin. Please see earlier admission note by Dr. Maudie Mercury. Continue same ABX for now, follow up on urine culture. CBC and BMP in AM.  Faye Ramsay, MD  [facility] Pager [phone] Cell [phone]  If 7PM-7AM, please contact night-coverage [email or web address] Password [email or web address]

## 2014-12-07 DIAGNOSIS — E876 Hypokalemia: Secondary | ICD-10-CM

## 2014-12-07 LAB — BASIC METABOLIC PANEL
ANION GAP: 4 — AB (ref 5–15)
BUN: 14 mg/dL (ref 6–23)
CO2: 14 mmol/L — ABNORMAL LOW (ref 19–32)
Calcium: 8.1 mg/dL — ABNORMAL LOW (ref 8.4–10.5)
Chloride: 116 mEq/L — ABNORMAL HIGH (ref 96–112)
Creatinine, Ser: 1.25 mg/dL — ABNORMAL HIGH (ref 0.50–1.10)
GFR calc Af Amer: 69 mL/min — ABNORMAL LOW (ref >90)
GFR calc non Af Amer: 59 mL/min — ABNORMAL LOW (ref >90)
Glucose, Bld: 99 mg/dL (ref 70–99)
Potassium: 4.6 mmol/L (ref 3.5–5.1)
Sodium: 134 mmol/L — ABNORMAL LOW (ref 135–145)

## 2014-12-07 LAB — CALCIUM / CREATININE RATIO, URINE
CALCIUM UR: 2 mg/dL
Calcium/Creat.Ratio: 0
Creatinine, Urine: 87.6 mg/dL

## 2014-12-07 LAB — CBC
HCT: 28.8 % — ABNORMAL LOW (ref 36.0–46.0)
HEMOGLOBIN: 9.6 g/dL — AB (ref 12.0–15.0)
MCH: 27.4 pg (ref 26.0–34.0)
MCHC: 33.3 g/dL (ref 30.0–36.0)
MCV: 82.1 fL (ref 78.0–100.0)
PLATELETS: 242 10*3/uL (ref 150–400)
RBC: 3.51 MIL/uL — ABNORMAL LOW (ref 3.87–5.11)
RDW: 14 % (ref 11.5–15.5)
WBC: 7 10*3/uL (ref 4.0–10.5)

## 2014-12-07 LAB — SODIUM, URINE, RANDOM

## 2014-12-07 MED ORDER — HYDROMORPHONE HCL 1 MG/ML IJ SOLN
0.5000 mg | INTRAMUSCULAR | Status: DC | PRN
  Administered 2014-12-07 – 2014-12-09 (×10): 0.5 mg via INTRAVENOUS
  Filled 2014-12-07 (×11): qty 1

## 2014-12-07 NOTE — Progress Notes (Signed)
Transfer orders in. Pt transported via wheelchair. All belongings and chart papers with pt.  Pt V/S stable and pt not in any acute distress.

## 2014-12-07 NOTE — Progress Notes (Signed)
Patient ID: Cindy [name], female   DOB: 1991/01/16, 24 y.o.   MRN: [id number]  [facility] PROGRESS NOTE  Cindy [name] [id number] DOB: December 15, 1990 DOA: 12/06/2014 PCP: Vidal Schwalbe, MD  Brief narrative: Pt is 24 yo female with history of FAP, has ostomy in place, presented to fevers, chills, bilateral flank pain. Admitted for management of ? Pyelonephritis to ICU due to hypotension, fevers, sepsis.   Assessment and Plan:    Active Problems:   Sepsis secondary to pyelonephritis - clinically improving with no fever over the past 24 hours - BP still soft but better, SBP in 90's this AM - continue IVF, Rocephin day #2 - repeat CBC and BMP in AM   Hypokalemia - supplemented and WNL this AM   Hyponatremia - likely pre renal in etiology - Na 132 --> 134 this AM   Acute renal failure  - secondary to pre renal etiology, sepsis - Cr slightly better but improving  - continue IVF and repeat BMP in AM   Metabolic acidosis - secondary to sepsis - IVF with supportive care and repeat BMP in AM   Anemia of chronic iron deficiency - no signs of bleeding  - repeat CBC in AM  DVT prophylaxis  Lovenox SQ while pt is in hospital  Code Status: Full Family Communication: Pt at bedside, cousin, Dr Rosemarie Ax over the phone  Disposition Plan: Home when medically stable  IV access:   Peripheral IV Procedures and diagnostic studies;  None   Medical consultants:   None  Other consultants:   None  Anti-infectives:   Rocephin 1/10 -->  Faye Ramsay, MD  [facility] Pager [phone]  If 7PM-7AM, please contact night-coverage [email or web address] Password [email or web address] 12/07/2014, 10:24 AM   LOS: 1 day   HPI/Subjective: No events overnight.   Objective: Filed Vitals:   12/07/14 0500 12/07/14 0600 12/07/14 0605 12/07/14 0800  BP:  93/37 92/67 80/39   Pulse:  86 106 92  Temp:    97.9 F (36.6 C)  TempSrc:    Oral  Resp:  14 15 15   Height:      Weight: 51.6 kg (113 lb 12.1 oz)      SpO2:  100% 100% 100%    Intake/Output Summary (Last 24 hours) at 12/07/14 1024 Last data filed at 12/07/14 [id number]  Gross per 24 hour  Intake 2415.08 ml  Output   1050 ml  Net 1365.08 ml    Exam:   General:  Pt is alert, follows commands appropriately, not in acute distress  Cardiovascular: Regular rate and rhythm, S1/S2, no murmurs, no rubs, no gallops  Respiratory: Clear to auscultation bilaterally, no wheezing, no crackles, no rhonchi  Abdomen: Soft, tender in lower abd quadrants, non distended, bowel sounds present, no guarding  Extremities: No edema, pulses DP and PT palpable bilaterally  Neuro: Grossly nonfocal  Data Reviewed: Basic Metabolic Panel:  Recent Labs Lab 12/06/14 0316 12/06/14 1032 12/06/14 1420 12/07/14 0340  NA 123* 132* 132* 134*  K 2.3* 2.9* 3.7 4.6  CL 96 107 109 116*  CO2 15* 15* 16* 14*  GLUCOSE 141* 105* 71 99  BUN 26* 22 19 14   CREATININE 1.58* 1.28* 1.30* 1.25*  CALCIUM 8.5 8.5 8.5 8.1*  MG 2.2  --   --   --    CBC:  Recent Labs Lab 12/06/14 0316 12/06/14 1420 12/07/14 0340  WBC 13.7* 9.0 7.0  NEUTROABS 10.0*  --   --   HGB 11.7* 10.6* 9.6*  HCT  34.2* 31.1* 28.8*  MCV 78.4 80.4 82.1  PLT 354 260 242    Recent Results (from the past 240 hour(s))  MRSA PCR Screening     Status: None   Collection Time: 12/06/14  6:47 AM  Result Value Ref Range Status   MRSA by PCR NEGATIVE NEGATIVE Final    Comment:        The GeneXpert MRSA Assay (FDA approved for NASAL specimens only), is one component of a comprehensive MRSA colonization surveillance program. It is not intended to diagnose MRSA infection nor to guide or monitor treatment for MRSA infections.      Scheduled Meds: . cefTRIAXone (ROCEPHIN)  IV  1 g Intravenous Q24H  . enoxaparin (LOVENOX) injection  40 mg Subcutaneous Q24H  . potassium chloride  40 mEq Oral BID  . sodium chloride  3 mL Intravenous Q12H   Continuous Infusions: . 0.9 % NaCl with KCl 40 mEq /  L 100 mL/hr (12/06/14 2021)

## 2014-12-08 LAB — CBC
HEMATOCRIT: 32.4 % — AB (ref 36.0–46.0)
Hemoglobin: 10.7 g/dL — ABNORMAL LOW (ref 12.0–15.0)
MCH: 27.7 pg (ref 26.0–34.0)
MCHC: 33 g/dL (ref 30.0–36.0)
MCV: 83.9 fL (ref 78.0–100.0)
Platelets: 252 10*3/uL (ref 150–400)
RBC: 3.86 MIL/uL — AB (ref 3.87–5.11)
RDW: 14.6 % (ref 11.5–15.5)
WBC: 6.6 10*3/uL (ref 4.0–10.5)

## 2014-12-08 LAB — BASIC METABOLIC PANEL
Anion gap: 3 — ABNORMAL LOW (ref 5–15)
Anion gap: 1 — ABNORMAL LOW (ref 5–15)
BUN: 8 mg/dL (ref 6–23)
BUN: 7 mg/dL (ref 6–23)
CALCIUM: 8.5 mg/dL (ref 8.4–10.5)
CHLORIDE: 123 meq/L — AB (ref 96–112)
CO2: 14 mmol/L — AB (ref 19–32)
CO2: 12 mmol/L — ABNORMAL LOW (ref 19–32)
Calcium: 7.7 mg/dL — ABNORMAL LOW (ref 8.4–10.5)
Chloride: 121 mEq/L — ABNORMAL HIGH (ref 96–112)
Creatinine, Ser: 1.15 mg/dL — ABNORMAL HIGH (ref 0.50–1.10)
Creatinine, Ser: 1.17 mg/dL — ABNORMAL HIGH (ref 0.50–1.10)
GFR calc Af Amer: 76 mL/min — ABNORMAL LOW (ref >90)
GFR calc Af Amer: 74 mL/min — ABNORMAL LOW (ref >90)
GFR calc non Af Amer: 66 mL/min — ABNORMAL LOW (ref >90)
GFR calc non Af Amer: 64 mL/min — ABNORMAL LOW (ref >90)
GLUCOSE: 89 mg/dL (ref 70–99)
Glucose, Bld: 91 mg/dL (ref 70–99)
POTASSIUM: 5.5 mmol/L — AB (ref 3.5–5.1)
Potassium: 4.7 mmol/L (ref 3.5–5.1)
Sodium: 138 mmol/L (ref 135–145)
Sodium: 136 mmol/L (ref 135–145)

## 2014-12-08 LAB — URINE CULTURE
Colony Count: NO GROWTH
Culture: NO GROWTH

## 2014-12-08 MED ORDER — PROMETHAZINE HCL 25 MG/ML IJ SOLN
12.5000 mg | Freq: Four times a day (QID) | INTRAMUSCULAR | Status: DC | PRN
  Administered 2014-12-08: 25 mg via INTRAVENOUS
  Filled 2014-12-08 (×2): qty 1

## 2014-12-08 MED ORDER — SODIUM CHLORIDE 0.9 % IV SOLN
INTRAVENOUS | Status: DC
  Administered 2014-12-08 (×2): via INTRAVENOUS

## 2014-12-08 NOTE — Progress Notes (Signed)
Patient ID: Cindy [name], female   DOB: Dec 16, 1990, 24 y.o.   MRN: [id number]  [facility] PROGRESS NOTE  Cindy [name] [id number] DOB: February 16, 1991 DOA: 12/06/2014 PCP: Cindy Schwalbe, MD  Brief narrative: Pt is 24 yo female with history of FAP, has ostomy in place, presented to fevers, chills, bilateral flank pain. Admitted for management of ? Pyelonephritis to ICU due to hypotension, fevers, sepsis.   Assessment and Plan:    Active Problems:  Sepsis secondary to pyelonephritis - clinically improving with no fever over the past 48 hours - BP still soft but better, SBP in 90's this AM - continue IVF, Rocephin day #3 - repeat CBC and BMP in AM  Hypokalemia - supplemented and now above target range - repeat BMP this afternoon and again in AM - no need for K supplementation   Hyponatremia - likely pre renal in etiology - Na 132 --> 134 --> 138 this AM  Acute renal failure  - secondary to pre renal etiology, sepsis - Cr slightly better, improving  - continue IVF and repeat BMP in AM  Metabolic acidosis - secondary to sepsis - IVF with supportive care and repeat BMP in AM  Anemia of chronic iron deficiency - no signs of bleeding  - repeat CBC in AM  DVT prophylaxis  Lovenox SQ while pt is in hospital  Code Status: Full Family Communication: Pt at bedside, cousin, Dr Rosemarie Ax over the phone  Disposition Plan: Home when medically stable  IV access:   Peripheral IV Procedures and diagnostic studies;  None  Medical consultants:   None  Other consultants:   None  Anti-infectives:   Rocephin 1/10 -->  Faye Ramsay, MD  [facility] Pager [phone]  If 7PM-7AM, please contact night-coverage [email or web address] Password [email or web address] 12/08/2014, 3:35 PM   LOS: 2 days   HPI/Subjective: No events overnight. Still with nausea and one episode of non bloody vomiting this AM.  Objective: Filed Vitals:   12/07/14 1430 12/07/14 2300 12/08/14  0300 12/08/14 0602  BP: 100/62 88/53  88/49  Pulse: 70 99  98  Temp: 98.4 F (36.9 C) 98.6 F (37 C)  97.5 F (36.4 C)  TempSrc: Oral Oral  Oral  Resp: 16 16  16   Height:      Weight:   51.7 kg (113 lb 15.7 oz)   SpO2: 97% 100%  99%    Intake/Output Summary (Last 24 hours) at 12/08/14 1535 Last data filed at 12/08/14 0400  Gross per 24 hour  Intake      0 ml  Output    500 ml  Net   -500 ml    Exam:   General:  Pt is alert, follows commands appropriately, not in acute distress  Cardiovascular: Regular rate and rhythm, S1/S2, no murmurs, no rubs, no gallops  Respiratory: Clear to auscultation bilaterally, no wheezing, no crackles, no rhonchi  Abdomen: Soft, non tender, non distended, bowel sounds present, no guarding  Extremities: No edema, pulses DP and PT palpable bilaterally  Neuro: Grossly nonfocal  Data Reviewed: Basic Metabolic Panel:  Recent Labs Lab 12/06/14 0316 12/06/14 1032 12/06/14 1420 12/07/14 0340 12/08/14 0400  NA 123* 132* 132* 134* 138  K 2.3* 2.9* 3.7 4.6 5.5*  CL 96 107 109 116* 121*  CO2 15* 15* 16* 14* 14*  GLUCOSE 141* 105* 71 99 89  BUN 26* 22 19 14 8   CREATININE 1.58* 1.28* 1.30* 1.25* 1.15*  CALCIUM 8.5 8.5 8.5 8.1* 8.5  MG 2.2  --   --   --   --  CBC:  Recent Labs Lab 12/06/14 0316 12/06/14 1420 12/07/14 0340 12/08/14 0400  WBC 13.7* 9.0 7.0 6.6  NEUTROABS 10.0*  --   --   --   HGB 11.7* 10.6* 9.6* 10.7*  HCT 34.2* 31.1* 28.8* 32.4*  MCV 78.4 80.4 82.1 83.9  PLT 354 260 242 252    Recent Results (from the past 240 hour(s))  MRSA PCR Screening     Status: None   Collection Time: 12/06/14  6:47 AM  Result Value Ref Range Status   MRSA by PCR NEGATIVE NEGATIVE Final    Scheduled Meds: . cefTRIAXone   IV  1 g Intravenous Q24H  . enoxaparin  injection  40 mg Subcutaneous Q24H   Continuous Infusions: . sodium chloride 100 mL/hr at 12/08/14 [phone]

## 2014-12-09 LAB — COMPREHENSIVE METABOLIC PANEL
ALBUMIN: 2.7 g/dL — AB (ref 3.5–5.2)
ALT: 18 U/L (ref 0–35)
ANION GAP: 3 — AB (ref 5–15)
AST: 15 U/L (ref 0–37)
Alkaline Phosphatase: 54 U/L (ref 39–117)
BUN: 6 mg/dL (ref 6–23)
CO2: 15 mmol/L — ABNORMAL LOW (ref 19–32)
Calcium: 7.8 mg/dL — ABNORMAL LOW (ref 8.4–10.5)
Chloride: 119 mEq/L — ABNORMAL HIGH (ref 96–112)
Creatinine, Ser: 1.05 mg/dL (ref 0.50–1.10)
GFR calc Af Amer: 85 mL/min — ABNORMAL LOW (ref >90)
GFR calc non Af Amer: 73 mL/min — ABNORMAL LOW (ref >90)
Glucose, Bld: 98 mg/dL (ref 70–99)
POTASSIUM: 3.7 mmol/L (ref 3.5–5.1)
Sodium: 137 mmol/L (ref 135–145)
Total Bilirubin: 0.4 mg/dL (ref 0.3–1.2)
Total Protein: 5 g/dL — ABNORMAL LOW (ref 6.0–8.3)

## 2014-12-09 LAB — CBC
HCT: 29.1 % — ABNORMAL LOW (ref 36.0–46.0)
HEMOGLOBIN: 9.7 g/dL — AB (ref 12.0–15.0)
MCH: 27.8 pg (ref 26.0–34.0)
MCHC: 33.3 g/dL (ref 30.0–36.0)
MCV: 83.4 fL (ref 78.0–100.0)
Platelets: 220 10*3/uL (ref 150–400)
RBC: 3.49 MIL/uL — ABNORMAL LOW (ref 3.87–5.11)
RDW: 14.8 % (ref 11.5–15.5)
WBC: 6.1 10*3/uL (ref 4.0–10.5)

## 2014-12-09 LAB — BASIC METABOLIC PANEL
Anion gap: 4 — ABNORMAL LOW (ref 5–15)
BUN: 6 mg/dL (ref 6–23)
CALCIUM: 7.9 mg/dL — AB (ref 8.4–10.5)
CO2: 15 mmol/L — ABNORMAL LOW (ref 19–32)
Chloride: 119 mEq/L — ABNORMAL HIGH (ref 96–112)
Creatinine, Ser: 1.09 mg/dL (ref 0.50–1.10)
GFR, EST AFRICAN AMERICAN: 81 mL/min — AB (ref >90)
GFR, EST NON AFRICAN AMERICAN: 70 mL/min — AB (ref >90)
Glucose, Bld: 97 mg/dL (ref 70–99)
POTASSIUM: 3.9 mmol/L (ref 3.5–5.1)
SODIUM: 138 mmol/L (ref 135–145)

## 2014-12-09 MED ORDER — OXYCODONE-ACETAMINOPHEN 5-325 MG PO TABS: 1.0000 | ORAL_TABLET | Freq: Four times a day (QID) | ORAL | Status: DC | PRN

## 2014-12-09 MED ORDER — CIPROFLOXACIN HCL 500 MG PO TABS
500.0000 mg | ORAL_TABLET | Freq: Two times a day (BID) | ORAL | Status: DC
  Filled 2014-12-09 (×2): qty 1

## 2014-12-09 MED ORDER — CIPROFLOXACIN HCL 500 MG PO TABS: 500.0000 mg | ORAL_TABLET | Freq: Two times a day (BID) | ORAL | Status: DC

## 2014-12-09 MED ORDER — CIPROFLOXACIN 500 MG/5ML (10%) PO SUSR: 500.0000 mg | Freq: Two times a day (BID) | ORAL | Status: DC

## 2014-12-09 MED ORDER — PROMETHAZINE HCL 12.5 MG PO TABS: 12.5000 mg | ORAL_TABLET | Freq: Four times a day (QID) | ORAL | Status: DC | PRN

## 2014-12-09 NOTE — Discharge Instructions (Signed)
Pyelonephritis, Adult °Pyelonephritis is a kidney infection. In general, there are 2 main types of pyelonephritis: °· Infections that come on quickly without any warning (acute pyelonephritis). °· Infections that persist for a long period of time (chronic pyelonephritis). °CAUSES  °Two main causes of pyelonephritis are: °· Bacteria traveling from the bladder to the kidney. This is a problem especially in pregnant women. The urine in the bladder can become filled with bacteria from multiple causes, including: °¨ Inflammation of the prostate gland (prostatitis). °¨ Sexual intercourse in females. °¨ Bladder infection (cystitis). °· Bacteria traveling from the bloodstream to the tissue part of the kidney. °Problems that may increase your risk of getting a kidney infection include: °· Diabetes. °· Kidney stones or bladder stones. °· Cancer. °· Catheters placed in the bladder. °· Other abnormalities of the kidney or ureter. °SYMPTOMS  °· Abdominal pain. °· Pain in the side or flank area. °· Fever. °· Chills. °· Upset stomach. °· Blood in the urine (dark urine). °· Frequent urination. °· Strong or persistent urge to urinate. °· Burning or stinging when urinating. °DIAGNOSIS  °Your caregiver may diagnose your kidney infection based on your symptoms. A urine sample may also be taken. °TREATMENT  °In general, treatment depends on how severe the infection is.  °· If the infection is mild and caught early, your caregiver may treat you with oral antibiotics and send you home. °· If the infection is more severe, the bacteria may have gotten into the bloodstream. This will require intravenous (IV) antibiotics and a hospital stay. Symptoms may include: °¨ High fever. °¨ Severe flank pain. °¨ Shaking chills. °· Even after a hospital stay, your caregiver may require you to be on oral antibiotics for a period of time. °· Other treatments may be required depending upon the cause of the infection. °HOME CARE INSTRUCTIONS  °· Take your  antibiotics as directed. Finish them even if you start to feel better. °· Make an appointment to have your urine checked to make sure the infection is gone. °· Drink enough fluids to keep your urine clear or pale yellow. °· Take medicines for the bladder if you have urgency and frequency of urination as directed by your caregiver. °SEEK IMMEDIATE MEDICAL CARE IF:  °· You have a fever or persistent symptoms for more than 2-3 days. °· You have a fever and your symptoms suddenly get worse. °· You are unable to take your antibiotics or fluids. °· You develop shaking chills. °· You experience extreme weakness or fainting. °· There is no improvement after 2 days of treatment. °MAKE SURE YOU: °· Understand these instructions. °· Will watch your condition. °· Will get help right away if you are not doing well or get worse. °Document Released: 11/13/2005 Document Revised: 05/14/2012 Document Reviewed: 04/19/2011 °[employer]® Patient Information ©2015 [employer]. This information is not intended to replace advice given to you by your health care provider. Make sure you discuss any questions you have with your health care provider. ° °

## 2014-12-09 NOTE — Discharge Summary (Signed)
Physician Discharge Summary  Cindy [name] [id number] DOB: 1991/08/28 DOA: 12/06/2014  PCP: Vidal Schwalbe, MD  Admit date: 12/06/2014 Discharge date: 12/09/2014  Recommendations for Outpatient Follow-up:  1. Pt will need to follow up with PCP in 2-3 weeks post discharge 2. Please obtain BMP to evaluate electrolytes and kidney function 3. Please also check CBC to evaluate Hg and Hct levels 4. 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 Admitted for management of ? Pyelonephritis to ICU due to hypotension, fevers, sepsis.   Assessment and Plan:    Active Problems:  Sepsis secondary to pyelonephritis - clinically improving with no fever over the past 48 hours - BP still soft but better, SBP in 90's this AM, per pt this is her baseline - pt wants to go home  - completed Rocephin for 4 days - transition to oral Cipro for completion of therapy upon discharge   Hypokalemia - supplemented and now WNL - no need for K supplementation   Hyponatremia - likely pre renal in etiology - Na 132 --> 134 --> 138 this AM  Acute renal failure  - secondary to pre renal etiology, sepsis - Cr now WNL  Metabolic acidosis - secondary to sepsis  Anemia of chronic iron deficiency - no signs of bleeding   Code Status: Full Family Communication: Pt at bedside, cousin, Dr Rosemarie Ax over the phone  Disposition Plan: Home when medically stable  IV access:   Peripheral IV Procedures and diagnostic studies;  None  Medical consultants:   None  Other consultants:   None   Anti-infectives:   Rocephin 1/10 -->   Discharge Exam: Filed Vitals:   12/09/14 0548  BP: 84/48  Pulse: 104  Temp: 98.5 F (36.9 C)  Resp: 16   Filed Vitals:   12/08/14 1500 12/08/14 2149 12/08/14 2213 12/09/14 0548  BP: 90/60 109/60 94/57 84/48   Pulse: 92  88 104  Temp: 98.6 F (37 C)  98.8 F (37.1 C) 98.5 F (36.9 C)  TempSrc: Oral  Oral Oral  Resp: 18  20 16   Height:      Weight:    50.3 kg (110 lb 14.3 oz)  SpO2: 98%  100% 100%    General: Pt is alert, follows commands appropriately, not in acute distress Cardiovascular: Regular rate and rhythm, S1/S2 +, no murmurs, no rubs, no gallops Respiratory: Clear to auscultation bilaterally, no wheezing, no crackles, no rhonchi Abdominal: Soft, non tender, non distended, bowel sounds +, no guarding Extremities: no edema, no cyanosis, pulses palpable bilaterally DP and PT Neuro: Grossly nonfocal  Discharge Instructions  Discharge Instructions    Diet - low sodium heart healthy    Complete by:  As directed      Increase activity slowly    Complete by:  As directed             Medication List    TAKE these medications        ciprofloxacin 500 MG/5ML (10%) suspension  Commonly known as:  CIPRO  Take 5 mLs (500 mg total) by mouth 2 (two) times daily. Take for 10 days  EPINEPHrine 0.3 mg/0.3 mL Soaj injection  Commonly known as:  EPI-PEN  Inject 0.3 mg into the muscle as needed (allergic reaction.).     lactose free nutrition Liqd  Take 237 mLs by mouth 2 (two) times daily between meals.     Loperamide HCl 1 MG/7.5ML Liqd  Commonly known as:  IMODIUM A-D  Take 15 mLs (2 mg total) by mouth every 6 (six) hours as needed.     oxyCODONE-acetaminophen 5-325 MG per tablet  Commonly known as:  PERCOCET/ROXICET  Take 1-2 tablets by mouth every 6 (six) hours as needed for severe pain.     potassium chloride 10 MEQ tablet  Commonly known as:  K-DUR  Take 1-2 tabs daily as needed for high ostomy output.      promethazine 12.5 MG tablet  Commonly known as:  PHENERGAN  Take 1 tablet (12.5 mg total) by mouth every 6 (six) hours as needed for nausea or vomiting.            Follow-up Information    Follow up with Vidal Schwalbe, MD.   Specialty:  Family Medicine   Contact information:   [address], [place] [address] [phone]       Follow up with Faye Ramsay, MD.   Specialty:  Internal Medicine   Why:  As needed, If symptoms worsen   Contact information:   [address] [place] [address] [phone]        The results of significant diagnostics from this hospitalization (including imaging, microbiology, ancillary and laboratory) are listed below for reference.     Microbiology: Recent Results (from the past 240 hour(s))  MRSA PCR Screening     Status: None   Collection Time: 12/06/14  6:47 AM  Result Value Ref Range Status   MRSA by PCR NEGATIVE NEGATIVE Final    Comment:        The GeneXpert MRSA Assay (FDA approved for NASAL specimens only), is one component of a comprehensive MRSA colonization surveillance program. It is not intended to diagnose MRSA infection nor to guide or monitor treatment for MRSA infections.   Culture, Urine     Status: None   Collection Time: 12/07/14  5:53 PM  Result Value Ref Range Status   Specimen Description URINE, RANDOM  Final   Special Requests NONE  Final   Colony Count NO GROWTH Performed at [facility]   Final   Culture NO GROWTH Performed at [facility]   Final   Report Status 12/08/2014 FINAL  Final     Labs: Basic Metabolic Panel:  Recent Labs Lab 12/06/14 0316  12/06/14 1420 12/07/14 0340 12/08/14 0400 12/08/14 1600 12/09/14 0540  NA 123*  < > 132* 134* 138 136 137  K 2.3*  < > 3.7 4.6 5.5* 4.7 3.7  CL 96  < > 109 116* 121* 123* 119*  CO2 15*  < > 16* 14* 14* 12* 15*  GLUCOSE 141*  < > 71 99 89 91 98  BUN 26*  < > 19 14 8 7 6    CREATININE 1.58*  < > 1.30* 1.25* 1.15* 1.17* 1.05  CALCIUM 8.5  < > 8.5 8.1* 8.5 7.7* 7.8*  MG 2.2  --   --   --   --   --   --   < > = values in this interval not displayed. Liver Function Tests:  Recent Labs Lab 12/09/14 0540  AST 15  ALT 18  ALKPHOS 54  BILITOT 0.4  PROT 5.0*  ALBUMIN 2.7*   CBC:  Recent Labs Lab 12/06/14 0316 12/06/14 1420 12/07/14 0340 12/08/14 0400 12/09/14 0540  WBC 13.7* 9.0 7.0 6.6 6.1  NEUTROABS 10.0*  --   --   --   --   HGB 11.7* 10.6* 9.6* 10.7* 9.7*  HCT 34.2* 31.1* 28.8* 32.4* 29.1*  MCV 78.4 80.4 82.1 83.9 83.4  PLT 354 260 242 252 220   BNP (last 3 results)  Recent Labs  05/13/14 1205  PROBNP 71.8   SIGNED: Time coordinating discharge: Over 30 minutes  Faye Ramsay, MD  [facility] 12/09/2014, 9:31 AM Pager [phone]  If 7PM-7AM, please contact night-coverage [email or web address] Password [email or web address]

## 2014-12-09 NOTE — Progress Notes (Signed)
Discharge instructions reviewed and signed by pt.  Patient discharged to home with family.

## 2015-01-14 ENCOUNTER — Encounter (HOSPITAL_COMMUNITY): Payer: Self-pay

## 2015-01-14 ENCOUNTER — Inpatient Hospital Stay (HOSPITAL_COMMUNITY)
Admission: EM | Admit: 2015-01-14 | Discharge: 2015-01-16 | DRG: 682 | Disposition: A | Discharge status: Home / Self Care | Payer: 59 | Attending: Internal Medicine | Admitting: Internal Medicine

## 2015-01-14 DIAGNOSIS — E872 Acidosis: Secondary | ICD-10-CM | POA: Diagnosis present

## 2015-01-14 DIAGNOSIS — E860 Dehydration: Secondary | ICD-10-CM | POA: Diagnosis present

## 2015-01-14 DIAGNOSIS — N179 Acute kidney failure, unspecified: Secondary | ICD-10-CM | POA: Diagnosis present

## 2015-01-14 DIAGNOSIS — Z933 Colostomy status: Secondary | ICD-10-CM

## 2015-01-14 DIAGNOSIS — Z91018 Allergy to other foods: Secondary | ICD-10-CM

## 2015-01-14 DIAGNOSIS — E871 Hypo-osmolality and hyponatremia: Secondary | ICD-10-CM | POA: Diagnosis present

## 2015-01-14 DIAGNOSIS — Z9109 Other allergy status, other than to drugs and biological substances: Secondary | ICD-10-CM | POA: Diagnosis not present

## 2015-01-14 DIAGNOSIS — Z881 Allergy status to other antibiotic agents status: Secondary | ICD-10-CM | POA: Diagnosis not present

## 2015-01-14 DIAGNOSIS — Z681 Body mass index (BMI) 19 or less, adult: Secondary | ICD-10-CM

## 2015-01-14 DIAGNOSIS — E43 Unspecified severe protein-calorie malnutrition: Secondary | ICD-10-CM | POA: Diagnosis present

## 2015-01-14 DIAGNOSIS — Z87442 Personal history of urinary calculi: Secondary | ICD-10-CM

## 2015-01-14 DIAGNOSIS — C189 Malignant neoplasm of colon, unspecified: Secondary | ICD-10-CM | POA: Diagnosis present

## 2015-01-14 DIAGNOSIS — Z79899 Other long term (current) drug therapy: Secondary | ICD-10-CM

## 2015-01-14 DIAGNOSIS — E876 Hypokalemia: Secondary | ICD-10-CM | POA: Diagnosis present

## 2015-01-14 DIAGNOSIS — R531 Weakness: Secondary | ICD-10-CM | POA: Diagnosis present

## 2015-01-14 LAB — BASIC METABOLIC PANEL
Anion gap: 6 (ref 5–15)
BUN: 28 mg/dL — AB (ref 6–23)
CALCIUM: 7.8 mg/dL — AB (ref 8.4–10.5)
CO2: 16 mmol/L — ABNORMAL LOW (ref 19–32)
Chloride: 109 mmol/L (ref 96–112)
Creatinine, Ser: 1.47 mg/dL — ABNORMAL HIGH (ref 0.50–1.10)
GFR calc Af Amer: 56 mL/min — ABNORMAL LOW (ref >90)
GFR, EST NON AFRICAN AMERICAN: 49 mL/min — AB (ref >90)
GLUCOSE: 112 mg/dL — AB (ref 70–99)
Potassium: 2.3 mmol/L — CL (ref 3.5–5.1)
Sodium: 131 mmol/L — ABNORMAL LOW (ref 135–145)

## 2015-01-14 LAB — COMPREHENSIVE METABOLIC PANEL
ALBUMIN: 4.7 g/dL (ref 3.5–5.2)
ALT: 14 U/L (ref 0–35)
ANION GAP: 12 (ref 5–15)
AST: 19 U/L (ref 0–37)
Alkaline Phosphatase: 84 U/L (ref 39–117)
BILIRUBIN TOTAL: 0.7 mg/dL (ref 0.3–1.2)
BUN: 43 mg/dL — ABNORMAL HIGH (ref 6–23)
CALCIUM: 9.2 mg/dL (ref 8.4–10.5)
CO2: 16 mmol/L — AB (ref 19–32)
Chloride: 97 mmol/L (ref 96–112)
Creatinine, Ser: 2.4 mg/dL — ABNORMAL HIGH (ref 0.50–1.10)
GFR calc Af Amer: 31 mL/min — ABNORMAL LOW (ref >90)
GFR calc non Af Amer: 27 mL/min — ABNORMAL LOW (ref >90)
GLUCOSE: 122 mg/dL — AB (ref 70–99)
POTASSIUM: 2.6 mmol/L — AB (ref 3.5–5.1)
SODIUM: 125 mmol/L — AB (ref 135–145)
Total Protein: 8.6 g/dL — ABNORMAL HIGH (ref 6.0–8.3)

## 2015-01-14 LAB — CBC WITH DIFFERENTIAL/PLATELET
BASOS ABS: 0 10*3/uL (ref 0.0–0.1)
BASOS PCT: 0 % (ref 0–1)
EOS ABS: 0.1 10*3/uL (ref 0.0–0.7)
EOS PCT: 1 % (ref 0–5)
HEMATOCRIT: 36 % (ref 36.0–46.0)
HEMOGLOBIN: 12.6 g/dL (ref 12.0–15.0)
Lymphocytes Relative: 21 % (ref 12–46)
Lymphs Abs: 1.6 10*3/uL (ref 0.7–4.0)
MCH: 27.8 pg (ref 26.0–34.0)
MCHC: 35 g/dL (ref 30.0–36.0)
MCV: 79.5 fL (ref 78.0–100.0)
MONOS PCT: 8 % (ref 3–12)
Monocytes Absolute: 0.6 10*3/uL (ref 0.1–1.0)
Neutro Abs: 5.3 10*3/uL (ref 1.7–7.7)
Neutrophils Relative %: 70 % (ref 43–77)
Platelets: 362 10*3/uL (ref 150–400)
RBC: 4.53 MIL/uL (ref 3.87–5.11)
RDW: 13.8 % (ref 11.5–15.5)
WBC: 7.6 10*3/uL (ref 4.0–10.5)

## 2015-01-14 LAB — URINALYSIS, ROUTINE W REFLEX MICROSCOPIC
Bilirubin Urine: NEGATIVE
GLUCOSE, UA: NEGATIVE mg/dL
HGB URINE DIPSTICK: NEGATIVE
KETONES UR: NEGATIVE mg/dL
Leukocytes, UA: NEGATIVE
NITRITE: NEGATIVE
PH: 6 (ref 5.0–8.0)
Protein, ur: NEGATIVE mg/dL
SPECIFIC GRAVITY, URINE: 1.011 (ref 1.005–1.030)
Urobilinogen, UA: 0.2 mg/dL (ref 0.0–1.0)

## 2015-01-14 LAB — POC URINE PREG, ED: Preg Test, Ur: NEGATIVE

## 2015-01-14 MED ORDER — POTASSIUM CHLORIDE 10 MEQ/100ML IV SOLN
10.0000 meq | INTRAVENOUS | Status: AC
  Administered 2015-01-14 – 2015-01-15 (×4): 10 meq via INTRAVENOUS
  Filled 2015-01-14 (×4): qty 100

## 2015-01-14 MED ORDER — PROMETHAZINE HCL 25 MG PO TABS: 12.5000 mg | ORAL_TABLET | Freq: Four times a day (QID) | ORAL | Status: DC | PRN

## 2015-01-14 MED ORDER — SODIUM CHLORIDE 0.9 % IV SOLN: INTRAVENOUS | Status: DC

## 2015-01-14 MED ORDER — POTASSIUM CHLORIDE CRYS ER 20 MEQ PO TBCR: 40.0000 meq | EXTENDED_RELEASE_TABLET | Freq: Once | ORAL | Status: DC

## 2015-01-14 MED ORDER — PROMETHAZINE HCL 25 MG/ML IJ SOLN
12.5000 mg | Freq: Four times a day (QID) | INTRAMUSCULAR | Status: DC | PRN
  Administered 2015-01-14 – 2015-01-15 (×2): 25 mg via INTRAVENOUS
  Filled 2015-01-14 (×2): qty 1

## 2015-01-14 MED ORDER — POTASSIUM CHLORIDE IN NACL 40-0.9 MEQ/L-% IV SOLN
INTRAVENOUS | Status: DC
Start: 2015-01-14 — End: 2015-01-15
  Administered 2015-01-14: 75 mL/h via INTRAVENOUS
  Filled 2015-01-14 (×2): qty 1000

## 2015-01-14 MED ORDER — OXYCODONE-ACETAMINOPHEN 5-325 MG PO TABS: 1.0000 | ORAL_TABLET | Freq: Four times a day (QID) | ORAL | Status: DC | PRN

## 2015-01-14 MED ORDER — BOOST PLUS PO LIQD: 237.0000 mL | Freq: Two times a day (BID) | ORAL | Status: DC

## 2015-01-14 MED ORDER — SODIUM CHLORIDE 0.9 % IV BOLUS (SEPSIS)
2000.0000 mL | Freq: Once | INTRAVENOUS | Status: AC
  Administered 2015-01-14: 2000 mL via INTRAVENOUS

## 2015-01-14 MED ORDER — MORPHINE SULFATE 2 MG/ML IJ SOLN
1.0000 mg | INTRAMUSCULAR | Status: DC | PRN
  Administered 2015-01-14: 1 mg via INTRAVENOUS
  Filled 2015-01-14: qty 1

## 2015-01-14 MED ORDER — ONDANSETRON HCL 4 MG PO TABS: 4.0000 mg | ORAL_TABLET | Freq: Four times a day (QID) | ORAL | Status: DC | PRN

## 2015-01-14 MED ORDER — ONDANSETRON HCL 4 MG/2ML IJ SOLN: 4.0000 mg | Freq: Four times a day (QID) | INTRAMUSCULAR | Status: DC | PRN

## 2015-01-14 MED ORDER — ENOXAPARIN SODIUM 30 MG/0.3ML ~~LOC~~ SOLN
30.0000 mg | SUBCUTANEOUS | Status: DC
Start: 2015-01-14 — End: 2015-01-15
  Administered 2015-01-14: 30 mg via SUBCUTANEOUS
  Filled 2015-01-14 (×2): qty 0.3

## 2015-01-14 MED ORDER — SODIUM CHLORIDE 0.9 % IV SOLN
INTRAVENOUS | Status: DC
  Administered 2015-01-14: 150 mL/h via INTRAVENOUS

## 2015-01-14 MED ORDER — HYDROMORPHONE HCL 1 MG/ML IJ SOLN
1.0000 mg | INTRAMUSCULAR | Status: DC | PRN
  Administered 2015-01-14 (×2): 1 mg via INTRAVENOUS
  Filled 2015-01-14 (×2): qty 1

## 2015-01-14 MED ORDER — POTASSIUM CHLORIDE 20 MEQ/15ML (10%) PO SOLN
40.0000 meq | Freq: Every day | ORAL | Status: DC
  Administered 2015-01-14 – 2015-01-16 (×3): 40 meq via ORAL
  Filled 2015-01-14 (×3): qty 30

## 2015-01-14 MED ORDER — BOOST PLUS PO LIQD
237.0000 mL | Freq: Two times a day (BID) | ORAL | Status: DC
  Administered 2015-01-15 – 2015-01-16 (×3): 237 mL via ORAL
  Filled 2015-01-14 (×5): qty 237

## 2015-01-14 NOTE — ED Notes (Addendum)
Lab called with critical potassium value of 2.6. Dr. Winfred Leeds aware of value. Gorden Harms RN aware of value as well.

## 2015-01-14 NOTE — H&P (Signed)
[facility] History and Physical  DRUCILLA DIPIETRANTONIO [id number] DOB: May 27, 1991 DOA: 01/14/2015  Referring physician: ED physician PCP: Vidal Schwalbe, MD   Chief Complaint: weakness  HPI:  Pt is 24 yo female with FAP, colon cancer, chronic hypokalemia and metabolic acidosis, last admitted about one month ago for sepsis from pyelonephritis, now presenting to [facility] ED with main concern of several days duration of progressive weakness and several episodes of non bloody vomiting, increased amount of stool in the colostomy bag since yesterday. She denies fevers, chills, no specific urinary concerns, no chest pain or shortness of breath.  In ED, pt is hemodynamically stable, Blood work notable for K 2.6, bicarb 16, Cr 2.4. TRH asked to admit for further evaluation and management of dehydration.   Assessment and Plan: Active Problems: Nausea and non bloody vomiting - unclear etiology - pt reports feeling better at the time of the admission  - will provide IVF and supplement electrolytes - repeat BMP in AM - if no improvement, consider GI consult in AM Hypokalemia - supplement with IVF and check Mg level  Metabolic acidosis - bicarb chronically on low side - continue IVF and repeat BMP in AM Acute renal failure/Hyponatremia  - likely pre renal and secondary to dehydration - IVF as noted above and repeat BMP in AM Severe PCM - in the context of chronic illness, FAP and colon cancer - advance diet if pt able to tolerate  DVT prophylaxis - Lovenox SQ  Radiological Exams on Admission: No results found.   Code Status: Full Family Communication: Pt at bedside Disposition Plan: Admit for further evaluation    Mart Piggs [facility] [id number]   Review of Systems:  Constitutional: Negative for fever. Negative for diaphoresis.  HENT: Negative for hearing loss, ear pain, nosebleeds, congestion, sore throat, neck pain, tinnitus and ear discharge.   Eyes: Negative for blurred vision,  double vision, photophobia, pain, discharge and redness.  Respiratory: Negative for cough, hemoptysis, sputum production, shortness of breath, wheezing and stridor.   Cardiovascular: Negative for chest pain, palpitations, orthopnea, claudication and leg swelling.  Gastrointestinal: Negative for heartburn, constipation, blood in stool and melena.  Genitourinary: Negative for dysuria, urgency, frequency, hematuria and flank pain.  Musculoskeletal: Negative for myalgias, back pain, joint pain and falls.  Skin: Negative for itching and rash.  Neurological: Negative for dizziness Endo/Heme/Allergies: Negative for environmental allergies and polydipsia. Does not bruise/bleed easily.  Psychiatric/Behavioral: Negative for suicidal ideas. The patient is not nervous/anxious.      Past Medical History  Diagnosis Date  . UTI (lower urinary tract infection)   . Heart murmur   . Pneumonia 2008; 2012  . Complication of anesthesia     "I swallowed the anesthesia when appendix taken out; got pneumonia"  . Iron deficiency anemia   . History of blood transfusion 04/26/2013    "4 bags" (05/21/2013)  . Colon cancer 2014    s/p resection and ileostomy  . 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 FAP (familial adenomatous polyposis) 09/30/2014  . Nephrolithiasis 09/30/2014    Past Surgical History  Procedure Laterality Date  . Leg reconstruction using fascial flap  ~ 2009  . Still born baby  03/22/13    delivered vaginally @ ~ 24 weeks  . Appendectomy  2008  . Colonoscopy N/A 05/24/2013    Procedure: COLONOSCOPY;  Surgeon: Missy Sabins, MD;  Location: [place];  Service: Endoscopy;  Laterality: N/A;  . Colon surgery    .  Ileostomy  06/26/2013  . I&d extremity Left 11/14/2013    Procedure: IRRIGATION AND DEBRIDEMENT LEFT LONG FINGER;  Surgeon: Tennis Must, MD;  Location: [place];  Service: Orthopedics;  Laterality: Left;    Social History:  reports that she  has never smoked. She has never used smokeless tobacco. She reports that she does not drink alcohol or use illicit drugs.  Allergies  Allergen Reactions  . Soap Hives and Itching    *Dove Soap*  . 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 Levaquin [Levofloxacin] Hives    Family History  Problem Relation Age of Onset  . Adopted: Yes  . Alcohol abuse Neg Hx   . Arthritis Neg Hx   . Asthma Neg Hx   . Birth defects Neg Hx   . Cancer Neg Hx   . COPD Neg Hx   . Depression Neg Hx   . Diabetes Neg Hx   . Drug abuse Neg Hx   . Early death Neg Hx   . Hearing loss Neg Hx   . Heart disease Neg Hx   . Hyperlipidemia Neg Hx   . Hypertension Neg Hx   . Kidney disease Neg Hx   . Learning disabilities Neg Hx   . Mental illness Neg Hx   . Mental retardation Neg Hx   . Miscarriages / Stillbirths Neg Hx   . Stroke Neg Hx   . Vision loss Neg Hx   . ALS Mother     Prior to Admission medications   Medication Sig Start Date End Date Taking? Authorizing Provider  EPINEPHrine 0.3 mg/0.3 mL IJ SOAJ injection Inject 0.3 mg into the muscle as needed (allergic reaction.). 08/10/13  Yes Joanell Rising, MD  Loperamide HCl (IMODIUM A-D) 1 MG/7.5ML LIQD Take 15 mLs (2 mg total) by mouth every 6 (six) hours as needed. Patient taking differently: Take 15 mLs by mouth every 6 (six) hours as needed (for diarrhea).  07/12/14  Yes Venetia Maxon Rama, MD  ciprofloxacin (CIPRO) 500 MG/5ML (10%) suspension Take 5 mLs (500 mg total) by mouth 2 (two) times daily. Take for 10 days Patient not taking: Reported on 01/14/2015 12/09/14   Theodis Blaze, MD  lactose free nutrition (BOOST PLUS) LIQD Take 237 mLs by mouth 2 (two) times daily between meals. Patient not taking: Reported on 12/06/2014 09/09/14   Robbie Lis, MD  oxyCODONE-acetaminophen (PERCOCET/ROXICET) 5-325 MG per tablet Take 1-2 tablets by mouth every 6 (six) hours as needed for severe pain. Patient not taking: Reported on 01/14/2015 12/09/14   Theodis Blaze, MD   potassium chloride (K-DUR) 10 MEQ tablet Take 1-2 tabs daily as needed for high ostomy output. Patient not taking: Reported on 12/06/2014 07/12/14   Venetia Maxon Rama, MD  promethazine (PHENERGAN) 12.5 MG tablet Take 1 tablet (12.5 mg total) by mouth every 6 (six) hours as needed for nausea or vomiting. Patient not taking: Reported on 01/14/2015 12/09/14   Theodis Blaze, MD    Physical Exam: Filed Vitals:   01/14/15 0848  BP: 98/50  Pulse: 118  Temp: 98.3 F (36.8 C)  Resp: 16  SpO2: 100%    Physical Exam  Constitutional: Appears well-developed and well-nourished. No distress.  HENT: Normocephalic. External right and left ear normal. Dry MM Eyes: Conjunctivae and EOM are normal. PERRLA, no scleral icterus.  Neck: Normal ROM. Neck supple. No JVD. No tracheal deviation. No thyromegaly.  CVS: Regular rhythm, tachycardic, S1/S2 +, no murmurs, no gallops, no  carotid bruit.  Pulmonary: Effort and breath sounds normal, no stridor, rhonchi, wheezes, rales.  Abdominal: Soft. BS +,  no distension, tenderness in epigastric area, no rebound, colostomy bag  Musculoskeletal: Normal range of motion. No edema and no tenderness.  Lymphadenopathy: No lymphadenopathy noted, cervical, inguinal. Neuro: Alert. Normal reflexes, muscle tone coordination. No cranial nerve deficit. Skin: Skin is warm and dry. No rash noted. Not diaphoretic. No erythema. No pallor.  Psychiatric: Normal mood and affect. Behavior, judgment, thought content normal.   Labs on Admission:  Basic Metabolic Panel:  Recent Labs Lab 01/14/15 0907  NA 125*  K 2.6*  CL 97  CO2 16*  GLUCOSE 122*  BUN 43*  CREATININE 2.40*  CALCIUM 9.2   Liver Function Tests:  Recent Labs Lab 01/14/15 0907  AST 19  ALT 14  ALKPHOS 84  BILITOT 0.7  PROT 8.6*  ALBUMIN 4.7   CBC:  Recent Labs Lab 01/14/15 0907  WBC 7.6  NEUTROABS 5.3  HGB 12.6  HCT 36.0  MCV 79.5  PLT 362    EKG: Normal sinus rhythm, no ST/T wave  changes   If 7PM-7AM, please contact night-coverage [email or web address] Password [facility] 01/14/2015, 10:42 AM

## 2015-01-14 NOTE — ED Provider Notes (Signed)
CSN: [id number]     Arrival date & time 01/14/15  0841 History   First MD Initiated Contact with Patient 01/14/15 [phone]     No chief complaint on file.    (Consider location/radiation/quality/duration/timing/severity/associated sxs/prior Treatment) HPI Complains of generalized weakness and feels dehydrated for approximately the last 3 days. Patient reports she was vomiting and had increased amount of stool in her colostomy bag yesterday. She last vomited yesterday. She does not feel nauseated now. Denies pain anywhere. Generalized weakness and feels dehydrated. No fever. No other associated symptoms. No treatment prior to coming no other associated symptoms. Past Medical History  Diagnosis Date  . UTI (lower urinary tract infection)   . Heart murmur   . Pneumonia 2008; 2012  . Complication of anesthesia     "I swallowed the anesthesia when appendix taken out; got pneumonia"  . Iron deficiency anemia   . History of blood transfusion 04/26/2013    "4 bags" (05/21/2013)  . Colon cancer 2014    s/p resection and ileostomy  . 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 FAP (familial adenomatous polyposis) 09/30/2014  . Nephrolithiasis 09/30/2014   Past Surgical History  Procedure Laterality Date  . Leg reconstruction using fascial flap  ~ 2009  . Still born baby  03/22/13    delivered vaginally @ ~ 24 weeks  . Appendectomy  2008  . Colonoscopy N/A 05/24/2013    Procedure: COLONOSCOPY;  Surgeon: Missy Sabins, MD;  Location: [place];  Service: Endoscopy;  Laterality: N/A;  . Colon surgery    . Ileostomy  06/26/2013  . I&d extremity Left 11/14/2013    Procedure: IRRIGATION AND DEBRIDEMENT LEFT LONG FINGER;  Surgeon: Tennis Must, MD;  Location: [place];  Service: Orthopedics;  Laterality: Left;   Family History  Problem Relation Age of Onset  . Adopted: Yes  . Alcohol abuse Neg Hx   . Arthritis Neg Hx   . Asthma Neg Hx   . Birth defects Neg Hx    . Cancer Neg Hx   . COPD Neg Hx   . Depression Neg Hx   . Diabetes Neg Hx   . Drug abuse Neg Hx   . Early death Neg Hx   . Hearing loss Neg Hx   . Heart disease Neg Hx   . Hyperlipidemia Neg Hx   . Hypertension Neg Hx   . Kidney disease Neg Hx   . Learning disabilities Neg Hx   . Mental illness Neg Hx   . Mental retardation Neg Hx   . Miscarriages / Stillbirths Neg Hx   . Stroke Neg Hx   . Vision loss Neg Hx   . ALS Mother    History  Substance Use Topics  . Smoking status: Never Smoker   . Smokeless tobacco: Never Used  . Alcohol Use: No   OB History    Gravida Para Term Preterm AB TAB SAB Ectopic Multiple Living   1 1  1            Review of Systems  Gastrointestinal: Positive for nausea and vomiting.       Nausea and vomiting resolved  Neurological: Positive for weakness.      Allergies  Orange fruit and Other  Home Medications   Prior to Admission medications   Medication Sig Start Date End Date Taking? Authorizing Provider  ciprofloxacin (CIPRO) 500 MG/5ML (10%) suspension Take 5 mLs (500 mg total) by  mouth 2 (two) times daily. Take for 10 days 12/09/14   Theodis Blaze, MD  EPINEPHrine 0.3 mg/0.3 mL IJ SOAJ injection Inject 0.3 mg into the muscle as needed (allergic reaction.). 08/10/13   Joanell Rising, MD  lactose free nutrition (BOOST PLUS) LIQD Take 237 mLs by mouth 2 (two) times daily between meals. Patient not taking: Reported on 12/06/2014 09/09/14   Robbie Lis, MD  Loperamide HCl (IMODIUM A-D) 1 MG/7.5ML LIQD Take 15 mLs (2 mg total) by mouth every 6 (six) hours as needed. 07/12/14   Venetia Maxon Rama, MD  oxyCODONE-acetaminophen (PERCOCET/ROXICET) 5-325 MG per tablet Take 1-2 tablets by mouth every 6 (six) hours as needed for severe pain. 12/09/14   Theodis Blaze, MD  potassium chloride (K-DUR) 10 MEQ tablet Take 1-2 tabs daily as needed for high ostomy output. Patient not taking: Reported on 12/06/2014 07/12/14   Venetia Maxon Rama, MD  promethazine  (PHENERGAN) 12.5 MG tablet Take 1 tablet (12.5 mg total) by mouth every 6 (six) hours as needed for nausea or vomiting. 12/09/14   Theodis Blaze, MD   There were no vitals taken for this visit. Physical Exam  Constitutional: She appears well-developed and well-nourished. No distress.  HENT:  Head: Normocephalic and atraumatic.  Eyes: Conjunctivae are normal. Pupils are equal, round, and reactive to light.  Neck: Neck supple. No tracheal deviation present. No thyromegaly present.  Cardiovascular: Normal rate and regular rhythm.   No murmur heard. Pulmonary/Chest: Effort normal and breath sounds normal.  Abdominal: Soft. Bowel sounds are normal. She exhibits no distension. There is no tenderness.  Colostomy in place Brown stool in colostomy bag  Musculoskeletal: Normal range of motion. She exhibits no edema or tenderness.  Neurological: She is alert. Coordination normal.  Skin: Skin is warm and dry. No rash noted.  Psychiatric: She has a normal mood and affect.  Nursing note and vitals reviewed.   ED Course  Procedures (including critical care time) Labs Review Labs Reviewed - No data to display  Imaging Review No results found.   EKG Interpretation None     10:30 AM Patient feels improved after treatment with intravenous fluids. She appears to be resting comfortably. Oral potassium supplement ordered Results for orders placed or performed during the hospital encounter of 01/14/15  Comprehensive metabolic panel  Result Value Ref Range   Sodium 125 (L) 135 - 145 mmol/L   Potassium 2.6 (LL) 3.5 - 5.1 mmol/L   Chloride 97 96 - 112 mmol/L   CO2 16 (L) 19 - 32 mmol/L   Glucose, Bld 122 (H) 70 - 99 mg/dL   BUN 43 (H) 6 - 23 mg/dL   Creatinine, Ser 2.40 (H) 0.50 - 1.10 mg/dL   Calcium 9.2 8.4 - 10.5 mg/dL   Total Protein 8.6 (H) 6.0 - 8.3 g/dL   Albumin 4.7 3.5 - 5.2 g/dL   AST 19 0 - 37 U/L   ALT 14 0 - 35 U/L   Alkaline Phosphatase 84 39 - 117 U/L   Total Bilirubin 0.7 0.3 -  1.2 mg/dL   GFR calc non Af Amer 27 (L) >90 mL/min   GFR calc Af Amer 31 (L) >90 mL/min   Anion gap 12 5 - 15  Urinalysis, Routine w reflex microscopic  Result Value Ref Range   Color, Urine YELLOW YELLOW   APPearance CLEAR CLEAR   Specific Gravity, Urine 1.011 1.005 - 1.030   pH 6.0 5.0 - 8.0   Glucose,  UA NEGATIVE NEGATIVE mg/dL   Hgb urine dipstick NEGATIVE NEGATIVE   Bilirubin Urine NEGATIVE NEGATIVE   Ketones, ur NEGATIVE NEGATIVE mg/dL   Protein, ur NEGATIVE NEGATIVE mg/dL   Urobilinogen, UA 0.2 0.0 - 1.0 mg/dL   Nitrite NEGATIVE NEGATIVE   Leukocytes, UA NEGATIVE NEGATIVE  CBC with Differential/Platelet  Result Value Ref Range   WBC 7.6 4.0 - 10.5 K/uL   RBC 4.53 3.87 - 5.11 MIL/uL   Hemoglobin 12.6 12.0 - 15.0 g/dL   HCT 36.0 36.0 - 46.0 %   MCV 79.5 78.0 - 100.0 fL   MCH 27.8 26.0 - 34.0 pg   MCHC 35.0 30.0 - 36.0 g/dL   RDW 13.8 11.5 - 15.5 %   Platelets 362 150 - 400 K/uL   Neutrophils Relative % 70 43 - 77 %   Neutro Abs 5.3 1.7 - 7.7 K/uL   Lymphocytes Relative 21 12 - 46 %   Lymphs Abs 1.6 0.7 - 4.0 K/uL   Monocytes Relative 8 3 - 12 %   Monocytes Absolute 0.6 0.1 - 1.0 K/uL   Eosinophils Relative 1 0 - 5 %   Eosinophils Absolute 0.1 0.0 - 0.7 K/uL   Basophils Relative 0 0 - 1 %   Basophils Absolute 0.0 0.0 - 0.1 K/uL  POC urine preg, ED (not at [facility])  Result Value Ref Range   Preg Test, Ur NEGATIVE NEGATIVE   No results found.  MDM  Spoke with Dr.Meyers who will arrange for inpatient stay. Plan intravenous fluids suggest recheck electrolytes and renal function Diagnoses #1 dehydration #2 hypokalemia #3 hyponatremia #4 renal insufficiency Final diagnoses:  None        Orlie Dakin, MD 01/14/15 1043

## 2015-01-14 NOTE — ED Notes (Addendum)
Pt presents with c/o weakness for the past few days. Pt reports that for the last few days she has felt nauseated and vomiting. Pt reports her ostomy bag is putting out more than usual. Pt has a hx of colon cancer. Pt believes she is dehydrated.

## 2015-01-14 NOTE — Progress Notes (Signed)
CRITICAL VALUE ALERT  Critical value received:  Potassium 2.3  Date of notification:  01/14/15  Time of notification:  2052  Critical value read back:Yes.    Nurse who received alert:  Edwyna Ready  MD notified (1st page):  Rogue Bussing  Time of first page:  2055  Responding MD: Rogue Bussing  Time MD responded:  2057

## 2015-01-15 DIAGNOSIS — N179 Acute kidney failure, unspecified: Principal | ICD-10-CM

## 2015-01-15 DIAGNOSIS — E872 Acidosis: Secondary | ICD-10-CM

## 2015-01-15 LAB — BASIC METABOLIC PANEL
ANION GAP: 5 (ref 5–15)
BUN: 22 mg/dL (ref 6–23)
CALCIUM: 8.1 mg/dL — AB (ref 8.4–10.5)
CO2: 16 mmol/L — ABNORMAL LOW (ref 19–32)
CREATININE: 1.37 mg/dL — AB (ref 0.50–1.10)
Chloride: 116 mmol/L — ABNORMAL HIGH (ref 96–112)
GFR calc Af Amer: 61 mL/min — ABNORMAL LOW (ref >90)
GFR, EST NON AFRICAN AMERICAN: 53 mL/min — AB (ref >90)
Glucose, Bld: 99 mg/dL (ref 70–99)
Potassium: 3.7 mmol/L (ref 3.5–5.1)
SODIUM: 137 mmol/L (ref 135–145)

## 2015-01-15 LAB — CBC
HCT: 25.1 % — ABNORMAL LOW (ref 36.0–46.0)
Hemoglobin: 8.5 g/dL — ABNORMAL LOW (ref 12.0–15.0)
MCH: 27.5 pg (ref 26.0–34.0)
MCHC: 33.9 g/dL (ref 30.0–36.0)
MCV: 81.2 fL (ref 78.0–100.0)
PLATELETS: 210 10*3/uL (ref 150–400)
RBC: 3.09 MIL/uL — ABNORMAL LOW (ref 3.87–5.11)
RDW: 14.2 % (ref 11.5–15.5)
WBC: 5.2 10*3/uL (ref 4.0–10.5)

## 2015-01-15 LAB — MAGNESIUM: Magnesium: 1.7 mg/dL (ref 1.5–2.5)

## 2015-01-15 MED ORDER — PROMETHAZINE HCL 25 MG PO TABS
12.5000 mg | ORAL_TABLET | Freq: Four times a day (QID) | ORAL | Status: DC | PRN
  Administered 2015-01-15: 12.5 mg via ORAL
  Filled 2015-01-15: qty 1

## 2015-01-15 MED ORDER — DEXTROSE 5 % IV SOLN
INTRAVENOUS | Status: DC
  Administered 2015-01-15: 11:00:00 via INTRAVENOUS

## 2015-01-15 MED ORDER — SODIUM CHLORIDE 0.9 % IV BOLUS (SEPSIS)
500.0000 mL | Freq: Once | INTRAVENOUS | Status: AC
  Administered 2015-01-15: 500 mL via INTRAVENOUS

## 2015-01-15 MED ORDER — OXYCODONE-ACETAMINOPHEN 5-325 MG PO TABS
1.0000 | ORAL_TABLET | Freq: Four times a day (QID) | ORAL | Status: DC | PRN
  Administered 2015-01-15: 2 via ORAL
  Filled 2015-01-15: qty 2

## 2015-01-15 MED ORDER — SODIUM CHLORIDE 0.45 % IV BOLUS
500.0000 mL | Freq: Once | INTRAVENOUS | Status: AC
  Administered 2015-01-15: 500 mL via INTRAVENOUS

## 2015-01-15 MED ORDER — ENOXAPARIN SODIUM 40 MG/0.4ML ~~LOC~~ SOLN
40.0000 mg | SUBCUTANEOUS | Status: DC
  Administered 2015-01-15: 40 mg via SUBCUTANEOUS
  Filled 2015-01-15 (×2): qty 0.4

## 2015-01-15 NOTE — Progress Notes (Signed)
[facility] PROGRESS NOTE  Assessment/Plan: Acute renal failure - Started on IV normal saline with improvement in her creatinine. - I will go ahead and change her fluids to half-normal saline 55. - Check a basic metabolic panel in the morning. - Urinary osmolarity,sodium and urinary creatinine were not check an admissions. - After given My recommendations to the patient, she was very hostile and she said she wanted to leave.  Metabolic acidosis: - Most likely due to high outputs from her ileostomy. - Change her IV fluids to D5 half-normal saline.  Adenocarcinoma of colon   Code Status: full Family Communication: none  Disposition Plan: observation   Consultants:  none  Procedures:  none  Antibiotics:  None  HPI/Subjective: She relates she will leave.  Objective: Filed Vitals:   01/14/15 2129 01/15/15 0500 01/15/15 0542 01/15/15 0827  BP: 93/42  86/39 88/42  Pulse: 86  90   Temp: 98.1 F (36.7 C)  97.3 F (36.3 C)   TempSrc: Oral  Oral   Resp: 16  16   Height:      Weight:  47.3 kg (104 lb 4.4 oz)    SpO2: 97%  100%     Intake/Output Summary (Last 24 hours) at 01/15/15 1004 Last data filed at 01/14/15 1804  Gross per 24 hour  Intake      0 ml  Output      2 ml  Net     -2 ml   Filed Weights   01/14/15 1947 01/15/15 0500  Weight: 47.6 kg (104 lb 15 oz) 47.3 kg (104 lb 4.4 oz)    Exam:  General: Alert, awake, oriented x3, in no acute distress.  HEENT: No bruits, no goiter.  Heart: Regular rate and rhythm. Lungs: Good air movement, clear  Abdomen: Soft, nontender, nondistended, positive bowel sounds.  Neuro: Grossly intact, nonfocal.   Data Reviewed: Basic Metabolic Panel:  Recent Labs Lab 01/14/15 0907 01/14/15 2010 01/15/15 0556  NA 125* 131* 137  K 2.6* 2.3* 3.7  CL 97 109 116*  CO2 16* 16* 16*  GLUCOSE 122* 112* 99  BUN 43* 28* 22  CREATININE 2.40* 1.47* 1.37*  CALCIUM 9.2 7.8* 8.1*  MG  --   --  1.7   Liver  Function Tests:  Recent Labs Lab 01/14/15 0907  AST 19  ALT 14  ALKPHOS 84  BILITOT 0.7  PROT 8.6*  ALBUMIN 4.7   No results for input(s): LIPASE, AMYLASE in the last 168 hours. No results for input(s): AMMONIA in the last 168 hours. CBC:  Recent Labs Lab 01/14/15 0907 01/15/15 0556  WBC 7.6 5.2  NEUTROABS 5.3  --   HGB 12.6 8.5*  HCT 36.0 25.1*  MCV 79.5 81.2  PLT 362 210   Cardiac Enzymes: No results for input(s): CKTOTAL, CKMB, CKMBINDEX, TROPONINI in the last 168 hours. BNP (last 3 results) No results for input(s): BNP in the last 8760 hours.  ProBNP (last 3 results)  Recent Labs  05/13/14 1205  PROBNP 71.8    CBG: No results for input(s): GLUCAP in the last 168 hours.  No results found for this or any previous visit (from the past 240 hour(s)).   Studies: No results found.  Scheduled Meds: . enoxaparin (LOVENOX) injection  30 mg Subcutaneous Q24H  . lactose free nutrition  237 mL Oral BID BM  . potassium chloride  40 mEq Oral Daily   Continuous Infusions: . dextrose       FELIZ ORTIZ,  [facility]  [facility] Pager [phone]. If 8PM-8AM, please contact night-coverage at [email or web address], password [facility] 01/15/2015, 10:04 AM  LOS: 1 day

## 2015-01-15 NOTE — Progress Notes (Signed)
UR complete 

## 2015-01-16 LAB — BASIC METABOLIC PANEL
ANION GAP: 4 — AB (ref 5–15)
Anion gap: 5 (ref 5–15)
BUN: 16 mg/dL (ref 6–23)
BUN: 13 mg/dL (ref 6–23)
CHLORIDE: 112 mmol/L (ref 96–112)
CO2: 14 mmol/L — ABNORMAL LOW (ref 19–32)
CO2: 19 mmol/L (ref 19–32)
Calcium: 8.1 mg/dL — ABNORMAL LOW (ref 8.4–10.5)
Calcium: 8.1 mg/dL — ABNORMAL LOW (ref 8.4–10.5)
Chloride: 112 mmol/L (ref 96–112)
Creatinine, Ser: 1.25 mg/dL — ABNORMAL HIGH (ref 0.50–1.10)
Creatinine, Ser: 1.15 mg/dL — ABNORMAL HIGH (ref 0.50–1.10)
GFR calc Af Amer: 69 mL/min — ABNORMAL LOW (ref >90)
GFR calc non Af Amer: 66 mL/min — ABNORMAL LOW (ref >90)
GFR, EST AFRICAN AMERICAN: 76 mL/min — AB (ref >90)
GFR, EST NON AFRICAN AMERICAN: 59 mL/min — AB (ref >90)
Glucose, Bld: 101 mg/dL — ABNORMAL HIGH (ref 70–99)
Glucose, Bld: 100 mg/dL — ABNORMAL HIGH (ref 70–99)
Potassium: 3 mmol/L — ABNORMAL LOW (ref 3.5–5.1)
Potassium: 3.1 mmol/L — ABNORMAL LOW (ref 3.5–5.1)
SODIUM: 131 mmol/L — AB (ref 135–145)
Sodium: 135 mmol/L (ref 135–145)

## 2015-01-16 LAB — CBC
HCT: 26.2 % — ABNORMAL LOW (ref 36.0–46.0)
Hemoglobin: 8.9 g/dL — ABNORMAL LOW (ref 12.0–15.0)
MCH: 28.3 pg (ref 26.0–34.0)
MCHC: 34 g/dL (ref 30.0–36.0)
MCV: 83.2 fL (ref 78.0–100.0)
Platelets: 223 10*3/uL (ref 150–400)
RBC: 3.15 MIL/uL — AB (ref 3.87–5.11)
RDW: 14.7 % (ref 11.5–15.5)
WBC: 6.1 10*3/uL (ref 4.0–10.5)

## 2015-01-16 MED ORDER — SODIUM BICARBONATE 650 MG PO TABS
1300.0000 mg | ORAL_TABLET | ORAL | Status: AC
  Administered 2015-01-16 (×3): 1300 mg via ORAL
  Filled 2015-01-16 (×3): qty 2

## 2015-01-16 MED ORDER — POTASSIUM CHLORIDE CRYS ER 20 MEQ PO TBCR
40.0000 meq | EXTENDED_RELEASE_TABLET | Freq: Two times a day (BID) | ORAL | Status: AC
  Administered 2015-01-16 (×2): 40 meq via ORAL
  Filled 2015-01-16 (×2): qty 2

## 2015-01-16 MED ORDER — SODIUM CHLORIDE 0.9 % IV BOLUS (SEPSIS)
500.0000 mL | Freq: Once | INTRAVENOUS | Status: AC
  Administered 2015-01-16: 500 mL via INTRAVENOUS

## 2015-01-16 MED ORDER — POTASSIUM CHLORIDE ER 10 MEQ PO TBCR: EXTENDED_RELEASE_TABLET | ORAL | Status: DC

## 2015-01-16 MED ORDER — SODIUM BICARBONATE 650 MG PO TABS
1300.0000 mg | ORAL_TABLET | Freq: Three times a day (TID) | ORAL | Status: DC
  Filled 2015-01-16 (×4): qty 2

## 2015-01-16 MED ORDER — SODIUM BICARBONATE 8.4 % IV SOLN
INTRAVENOUS | Status: DC
  Administered 2015-01-16: 10:00:00 via INTRAVENOUS
  Filled 2015-01-16 (×2): qty 1000

## 2015-01-16 NOTE — Discharge Summary (Addendum)
Physician Discharge Summary  Cindy [name] WEX:[id number] DOB: 12-07-1990 DOA: 01/14/2015  PCP: Vidal Schwalbe, MD  Admit date: 01/14/2015 Discharge date: 01/16/2015  Time spent: 30 minutes  Recommendations for Outpatient Follow-up:  1. Follow up with Surgeon over at [facility]. 2. Check b-met in 5 days to follow her potassium.  Discharge Diagnoses:  Principal Problem:   Acute renal failure Active Problems:   Adenocarcinoma of colon   Discharge Condition: stable  Diet recommendation: regular  Filed Weights   01/14/15 1947 01/15/15 0500 01/16/15 0500  Weight: 47.6 kg (104 lb 15 oz) 47.3 kg (104 lb 4.4 oz) 47.3 kg (104 lb 4.4 oz)    History of present illness:  24 yo female with FAP, colon cancer, chronic hypokalemia and metabolic acidosis, last admitted about one month ago for sepsis from pyelonephritis, now presenting to [facility] ED with main concern of several days duration of progressive weakness and several episodes of non bloody vomiting, increased amount of stool in the colostomy bag since yesterday. She denies fevers, chills, no specific urinary concerns, no chest pain or shortness of breath.   Hospital Course:  Acute renal failure - Started on IV normal saline with improvement in her creatinine. - Cr improved along with hyponatremia.  Metabolic acidosis: - Most likely due to high outputs from her ileostomy. - Changed her fluid D5w with HCO and acidosis resolved.  History of Adenocarcinoma of colon  Hypokalemia: - Most likely due to high ostomy output. - It was repleted and slowly improved. - The pt wanted to go home today. - She will go on 40 mEq of KCL twice a day for day days then daily.  Procedures:  none  Consultations:  none  Discharge Exam: Filed Vitals:   01/16/15 0500  BP: 79/46  Pulse: 104  Temp: 98.4 F (36.9 C)  Resp: 18    General: A&O x3 Cardiovascular: RRR Respiratory: good air movement CTA B/L  Discharge  Instructions   Discharge Instructions    Diet - low sodium heart healthy    Complete by:  As directed      Increase activity slowly    Complete by:  As directed           Current Discharge Medication List    CONTINUE these medications which have NOT CHANGED   Details  EPINEPHrine 0.3 mg/0.3 mL IJ SOAJ injection Inject 0.3 mg into the muscle as needed (allergic reaction.).    Loperamide HCl (IMODIUM A-D) 1 MG/7.5ML LIQD Take 15 mLs (2 mg total) by mouth every 6 (six) hours as needed. Qty: 150 mL, Refills: 2    lactose free nutrition (BOOST PLUS) LIQD Take 237 mLs by mouth 2 (two) times daily between meals. Qty: 237 mL, Refills: 0    oxyCODONE-acetaminophen (PERCOCET/ROXICET) 5-325 MG per tablet Take 1-2 tablets by mouth every 6 (six) hours as needed for severe pain. Qty: 65 tablet, Refills: 0    potassium chloride (K-DUR) 10 MEQ tablet Take 1-2 tabs daily as needed for high ostomy output. Qty: 60 tablet, Refills: 0    promethazine (PHENERGAN) 12.5 MG tablet Take 1 tablet (12.5 mg total) by mouth every 6 (six) hours as needed for nausea or vomiting. Qty: 65 tablet, Refills: 0      STOP taking these medications     ciprofloxacin (CIPRO) 500 MG/5ML (10%) suspension        Allergies  Allergen Reactions  . Soap Hives and Itching    *Dove Soap*  . Orange Fruit [Citrus]  Hives  . Levaquin [Levofloxacin] Hives      The results of significant diagnostics from this hospitalization (including imaging, microbiology, ancillary and laboratory) are listed below for reference.    Significant Diagnostic Studies: No results found.  Microbiology: No results found for this or any previous visit (from the past 240 hour(s)).   Labs: Basic Metabolic Panel:  Recent Labs Lab 01/14/15 0907 01/14/15 2010 01/15/15 0556 01/16/15 0549  NA 125* 131* 137 131*  K 2.6* 2.3* 3.7 3.0*  CL 97 109 116* 112  CO2 16* 16* 16* 14*  GLUCOSE 122* 112* 99 101*  BUN 43* 28* 22 16  CREATININE  2.40* 1.47* 1.37* 1.25*  CALCIUM 9.2 7.8* 8.1* 8.1*  MG  --   --  1.7  --    Liver Function Tests:  Recent Labs Lab 01/14/15 0907  AST 19  ALT 14  ALKPHOS 84  BILITOT 0.7  PROT 8.6*  ALBUMIN 4.7   No results for input(s): LIPASE, AMYLASE in the last 168 hours. No results for input(s): AMMONIA in the last 168 hours. CBC:  Recent Labs Lab 01/14/15 0907 01/15/15 0556 01/16/15 0549  WBC 7.6 5.2 6.1  NEUTROABS 5.3  --   --   HGB 12.6 8.5* 8.9*  HCT 36.0 25.1* 26.2*  MCV 79.5 81.2 83.2  PLT 362 210 223   Cardiac Enzymes: No results for input(s): CKTOTAL, CKMB, CKMBINDEX, TROPONINI in the last 168 hours. BNP: BNP (last 3 results) No results for input(s): BNP in the last 8760 hours.  ProBNP (last 3 results)  Recent Labs  05/13/14 1205  PROBNP 71.8    CBG: No results for input(s): GLUCAP in the last 168 hours.     Signed:  Charlynne Cousins  [facility] 01/16/2015, 9:38 AM

## 2015-01-18 NOTE — Discharge Instructions (Signed)
Cindy [name] was admitted to the Hospital on 01/14/2015 and Discharged on Discharge Date 01/18/2015 and should be excused from work/school   for 3  days starting 01/14/2015 , may return to work/school without any restrictions.  Call Bess Harvest MD, [facility] [phone] with questions.  Charlynne Cousins M.D on 01/18/2015,at 12:29 PM  [facility] Office  [phone]

## 2015-02-16 ENCOUNTER — Inpatient Hospital Stay (HOSPITAL_COMMUNITY)
Admission: EM | Admit: 2015-02-16 | Discharge: 2015-02-17 | DRG: 392 | Disposition: A | Discharge status: Home / Self Care | Payer: 59 | Attending: Internal Medicine | Admitting: Internal Medicine

## 2015-02-16 ENCOUNTER — Encounter (HOSPITAL_COMMUNITY): Payer: Self-pay | Admitting: Emergency Medicine

## 2015-02-16 DIAGNOSIS — N179 Acute kidney failure, unspecified: Secondary | ICD-10-CM | POA: Diagnosis present

## 2015-02-16 DIAGNOSIS — E872 Acidosis: Secondary | ICD-10-CM | POA: Diagnosis present

## 2015-02-16 DIAGNOSIS — A084 Viral intestinal infection, unspecified: Principal | ICD-10-CM | POA: Diagnosis present

## 2015-02-16 DIAGNOSIS — Z681 Body mass index (BMI) 19 or less, adult: Secondary | ICD-10-CM

## 2015-02-16 DIAGNOSIS — Z85038 Personal history of other malignant neoplasm of large intestine: Secondary | ICD-10-CM | POA: Diagnosis not present

## 2015-02-16 DIAGNOSIS — D509 Iron deficiency anemia, unspecified: Secondary | ICD-10-CM | POA: Diagnosis present

## 2015-02-16 DIAGNOSIS — E876 Hypokalemia: Secondary | ICD-10-CM

## 2015-02-16 DIAGNOSIS — N183 Chronic kidney disease, stage 3 (moderate): Secondary | ICD-10-CM | POA: Diagnosis present

## 2015-02-16 DIAGNOSIS — Z9049 Acquired absence of other specified parts of digestive tract: Secondary | ICD-10-CM | POA: Diagnosis not present

## 2015-02-16 DIAGNOSIS — Z79899 Other long term (current) drug therapy: Secondary | ICD-10-CM | POA: Diagnosis not present

## 2015-02-16 DIAGNOSIS — N189 Chronic kidney disease, unspecified: Secondary | ICD-10-CM

## 2015-02-16 DIAGNOSIS — E440 Moderate protein-calorie malnutrition: Secondary | ICD-10-CM | POA: Diagnosis present

## 2015-02-16 DIAGNOSIS — Z933 Colostomy status: Secondary | ICD-10-CM

## 2015-02-16 DIAGNOSIS — E860 Dehydration: Secondary | ICD-10-CM | POA: Diagnosis present

## 2015-02-16 DIAGNOSIS — R112 Nausea with vomiting, unspecified: Secondary | ICD-10-CM | POA: Diagnosis present

## 2015-02-16 DIAGNOSIS — E871 Hypo-osmolality and hyponatremia: Secondary | ICD-10-CM | POA: Diagnosis present

## 2015-02-16 DIAGNOSIS — K529 Noninfective gastroenteritis and colitis, unspecified: Secondary | ICD-10-CM

## 2015-02-16 LAB — URINALYSIS, ROUTINE W REFLEX MICROSCOPIC
Bilirubin Urine: NEGATIVE
GLUCOSE, UA: NEGATIVE mg/dL
HGB URINE DIPSTICK: NEGATIVE
Ketones, ur: NEGATIVE mg/dL
Leukocytes, UA: NEGATIVE
Nitrite: NEGATIVE
PH: 5.5 (ref 5.0–8.0)
Protein, ur: NEGATIVE mg/dL
SPECIFIC GRAVITY, URINE: 1.017 (ref 1.005–1.030)
Urobilinogen, UA: 0.2 mg/dL (ref 0.0–1.0)

## 2015-02-16 LAB — BASIC METABOLIC PANEL
Anion gap: 11 (ref 5–15)
BUN: 44 mg/dL — AB (ref 6–23)
CALCIUM: 7.5 mg/dL — AB (ref 8.4–10.5)
CO2: 15 mmol/L — AB (ref 19–32)
Chloride: 105 mmol/L (ref 96–112)
Creatinine, Ser: 2.12 mg/dL — ABNORMAL HIGH (ref 0.50–1.10)
GFR calc Af Amer: 36 mL/min — ABNORMAL LOW (ref >90)
GFR, EST NON AFRICAN AMERICAN: 31 mL/min — AB (ref >90)
GLUCOSE: 99 mg/dL (ref 70–99)
Potassium: 3.2 mmol/L — ABNORMAL LOW (ref 3.5–5.1)
SODIUM: 131 mmol/L — AB (ref 135–145)

## 2015-02-16 LAB — CBC WITH DIFFERENTIAL/PLATELET
Basophils Absolute: 0 10*3/uL (ref 0.0–0.1)
Basophils Relative: 0 % (ref 0–1)
EOS ABS: 0 10*3/uL (ref 0.0–0.7)
Eosinophils Relative: 0 % (ref 0–5)
HEMATOCRIT: 35.2 % — AB (ref 36.0–46.0)
HEMOGLOBIN: 12.5 g/dL (ref 12.0–15.0)
Lymphocytes Relative: 18 % (ref 12–46)
Lymphs Abs: 1.8 10*3/uL (ref 0.7–4.0)
MCH: 28.9 pg (ref 26.0–34.0)
MCHC: 35.5 g/dL (ref 30.0–36.0)
MCV: 81.5 fL (ref 78.0–100.0)
MONO ABS: 0.7 10*3/uL (ref 0.1–1.0)
MONOS PCT: 7 % (ref 3–12)
Neutro Abs: 7.6 10*3/uL (ref 1.7–7.7)
Neutrophils Relative %: 75 % (ref 43–77)
Platelets: 419 10*3/uL — ABNORMAL HIGH (ref 150–400)
RBC: 4.32 MIL/uL (ref 3.87–5.11)
RDW: 13.6 % (ref 11.5–15.5)
WBC: 10.2 10*3/uL (ref 4.0–10.5)

## 2015-02-16 LAB — COMPREHENSIVE METABOLIC PANEL
ALBUMIN: 4.9 g/dL (ref 3.5–5.2)
ALT: 14 U/L (ref 0–35)
AST: 17 U/L (ref 0–37)
Alkaline Phosphatase: 93 U/L (ref 39–117)
Anion gap: 14 (ref 5–15)
BUN: 53 mg/dL — ABNORMAL HIGH (ref 6–23)
CHLORIDE: 96 mmol/L (ref 96–112)
CO2: 18 mmol/L — ABNORMAL LOW (ref 19–32)
Calcium: 9.1 mg/dL (ref 8.4–10.5)
Creatinine, Ser: 2.69 mg/dL — ABNORMAL HIGH (ref 0.50–1.10)
GFR, EST AFRICAN AMERICAN: 27 mL/min — AB (ref >90)
GFR, EST NON AFRICAN AMERICAN: 23 mL/min — AB (ref >90)
Glucose, Bld: 104 mg/dL — ABNORMAL HIGH (ref 70–99)
POTASSIUM: 3.2 mmol/L — AB (ref 3.5–5.1)
Sodium: 128 mmol/L — ABNORMAL LOW (ref 135–145)
TOTAL PROTEIN: 8.5 g/dL — AB (ref 6.0–8.3)
Total Bilirubin: 0.7 mg/dL (ref 0.3–1.2)

## 2015-02-16 LAB — I-STAT BETA HCG BLOOD, ED (MC, WL, AP ONLY): I-stat hCG, quantitative: <5 m[IU]/mL (ref <5)

## 2015-02-16 LAB — LIPASE, BLOOD: Lipase: 44 U/L (ref 11–59)

## 2015-02-16 LAB — I-STAT CG4 LACTIC ACID, ED: Lactic Acid, Venous: 1.36 mmol/L (ref 0.5–2.0)

## 2015-02-16 LAB — MAGNESIUM: Magnesium: 2.6 mg/dL — ABNORMAL HIGH (ref 1.5–2.5)

## 2015-02-16 MED ORDER — LOPERAMIDE HCL 1 MG/5ML PO LIQD
2.0000 mg | Freq: Two times a day (BID) | ORAL | Status: DC
  Filled 2015-02-16 (×2): qty 10

## 2015-02-16 MED ORDER — MORPHINE SULFATE 2 MG/ML IJ SOLN
1.0000 mg | INTRAMUSCULAR | Status: DC | PRN
  Administered 2015-02-16: 1 mg via INTRAVENOUS
  Filled 2015-02-16: qty 1

## 2015-02-16 MED ORDER — LOPERAMIDE HCL 1 MG/5ML PO LIQD
2.0000 mg | Freq: Two times a day (BID) | ORAL | Status: DC
  Administered 2015-02-16 – 2015-02-17 (×2): 2 mg via ORAL
  Filled 2015-02-16 (×3): qty 10

## 2015-02-16 MED ORDER — LOPERAMIDE HCL 2 MG PO CAPS: 2.0000 mg | ORAL_CAPSULE | Freq: Three times a day (TID) | ORAL | Status: DC | PRN

## 2015-02-16 MED ORDER — POTASSIUM CHLORIDE 10 MEQ/100ML IV SOLN
10.0000 meq | Freq: Once | INTRAVENOUS | Status: AC
  Administered 2015-02-16: 10 meq via INTRAVENOUS
  Filled 2015-02-16: qty 100

## 2015-02-16 MED ORDER — HEPARIN SODIUM (PORCINE) 5000 UNIT/ML IJ SOLN
5000.0000 [IU] | Freq: Three times a day (TID) | INTRAMUSCULAR | Status: DC
  Administered 2015-02-16 – 2015-02-17 (×3): 5000 [IU] via SUBCUTANEOUS
  Filled 2015-02-16 (×3): qty 1

## 2015-02-16 MED ORDER — SODIUM CHLORIDE 0.9 % IV BOLUS (SEPSIS)
500.0000 mL | Freq: Once | INTRAVENOUS | Status: AC
  Administered 2015-02-16: 500 mL via INTRAVENOUS

## 2015-02-16 MED ORDER — MORPHINE SULFATE 4 MG/ML IJ SOLN
4.0000 mg | Freq: Once | INTRAMUSCULAR | Status: AC
  Administered 2015-02-16: 4 mg via INTRAVENOUS
  Filled 2015-02-16: qty 1

## 2015-02-16 MED ORDER — SODIUM CHLORIDE 0.9 % IV BOLUS (SEPSIS)
1000.0000 mL | Freq: Once | INTRAVENOUS | Status: AC
  Administered 2015-02-16: 1000 mL via INTRAVENOUS

## 2015-02-16 MED ORDER — POTASSIUM CHLORIDE 2 MEQ/ML IV SOLN
INTRAVENOUS | Status: DC
  Administered 2015-02-16 – 2015-02-17 (×2): via INTRAVENOUS
  Filled 2015-02-16 (×4): qty 1000

## 2015-02-16 MED ORDER — PROMETHAZINE HCL 25 MG/ML IJ SOLN
12.5000 mg | Freq: Four times a day (QID) | INTRAMUSCULAR | Status: DC | PRN
  Administered 2015-02-16: 25 mg via INTRAVENOUS
  Filled 2015-02-16: qty 1

## 2015-02-16 NOTE — H&P (Signed)
[facility] History and Physical  Cindy [name] [id number] DOB: 12-12-1990 DOA: 02/16/2015  Referring physician: ED PCP: Vidal Schwalbe, MD  Specialists:   Chief Complaint: Nausea vomiting emesis  HPI:  24 y/o ? Known history of colon cancer diagnosed 04/2013 status post subtotal colectomy/en bloc resection terminal ileum-PT 3 an old right colon adenocarcinoma, background FAP + severe malnutrition 05/2013 06/2013 [facility] [Dr. Lenise Arena surgeon, hematologist Dr.Rodwige Desnoyers], , prior perineal abscess with I&D,  She also has history of prior osteomyelitis, 11/2014 admitted sepsis secondary to pyelonephritis, constitutional hypotension with systolic blood pressures in the 90s, anemia of unclear etiology She's been hospitalized at [facility] multiple times for high ostomy output but this time presented to the emergency room with 2 day history of increasing nausea vomiting. States that she also has pain on her left side. She states that she's been dizzy. She has no chest pain, she has no shortness of breath, - Fever, - chills, - rash, - sputum, - blurred vision, - double vision, - dysuria, - falls, - weakness - Blood in emesis, - chest pain, - other issues She states that she ate food at a friend's place on 02/14/15 that may been questionable as it was in the fridge and it was baked chicken and she does not know how it was cooked.  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   Review of Systems:  A 14 point ROS was performed and is negative except as noted in the hpi  Past Medical History  Diagnosis Date  . UTI (lower urinary tract infection)   . Heart murmur   . Pneumonia 2008; 2012  . Complication of anesthesia     "I swallowed the anesthesia when appendix taken out; got pneumonia"  . Iron deficiency anemia   . History of blood transfusion 04/26/2013    "4 bags" (05/21/2013)  . Colon cancer 2014    s/p resection and ileostomy  . 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 FAP (familial adenomatous polyposis) 09/30/2014  . Nephrolithiasis 09/30/2014   Past Surgical History  Procedure Laterality Date  . Leg reconstruction using fascial flap  ~ 2009  . Still born baby  03/22/13    delivered vaginally @ ~ 24 weeks  . Appendectomy  2008  . Colonoscopy N/A 05/24/2013    Procedure: COLONOSCOPY;  Surgeon: Missy Sabins, MD;  Location: [place];  Service: Endoscopy;  Laterality: N/A;  . Colon surgery    . Ileostomy  06/26/2013  . I&d extremity Left 11/14/2013    Procedure: IRRIGATION AND DEBRIDEMENT LEFT LONG FINGER;  Surgeon: Tennis Must, MD;  Location: [place];  Service: Orthopedics;  Laterality: Left;   Social History:  History   Social History Narrative   Worker's comp.  Previously worked at [employer]  . Soap Hives and Itching    *Dove Soap*  . Orange Fruit [  Citrus] Hives  . Levaquin [Levofloxacin] Hives    Family History  Problem Relation Age of Onset  . Adopted: Yes  . Alcohol abuse Neg Hx   . Arthritis Neg Hx   . Asthma Neg Hx   . Birth defects Neg Hx   . Cancer Neg Hx   . COPD Neg Hx   . Depression Neg Hx   . Diabetes Neg Hx   . Drug abuse Neg Hx   . Early death Neg Hx   . Hearing loss Neg Hx   . Heart disease Neg Hx   . Hyperlipidemia Neg Hx   . Hypertension Neg Hx   . Kidney disease Neg Hx   . Learning disabilities  Neg Hx   . Mental illness Neg Hx   . Mental retardation Neg Hx   . Miscarriages / Stillbirths Neg Hx   . Stroke Neg Hx   . Vision loss Neg Hx   . ALS Mother     Prior to Admission medications   Medication Sig Start Date End Date Taking? Authorizing Provider  EPINEPHrine 0.3 mg/0.3 mL IJ SOAJ injection Inject 0.3 mg into the muscle as needed (allergic reaction.). 08/10/13  Yes Sharmon Leyden, MD  potassium chloride (K-DUR) 10 MEQ tablet Take 4 tab twice a day for 4 days. Then 2 tabs daily. Patient taking differently: Take 10 mEq by mouth 2 (two) times daily.  01/16/15  Yes Charlynne Cousins, MD  lactose free nutrition (BOOST PLUS) LIQD Take 237 mLs by mouth 2 (two) times daily between meals. Patient not taking: Reported on 12/06/2014 09/09/14   Robbie Lis, MD  Loperamide HCl (IMODIUM A-D) 1 MG/7.5ML LIQD Take 15 mLs (2 mg total) by mouth every 6 (six) hours as needed. Patient taking differently: Take 15 mLs by mouth every 6 (six) hours as needed (for diarrhea).  07/12/14   Venetia Maxon Rama, MD  oxyCODONE-acetaminophen (PERCOCET/ROXICET) 5-325 MG per tablet Take 1-2 tablets by mouth every 6 (six) hours as needed for severe pain. Patient not taking: Reported on 01/14/2015 12/09/14   Theodis Blaze, MD  promethazine (PHENERGAN) 12.5 MG tablet Take 1 tablet (12.5 mg total) by mouth every 6 (six) hours as needed for nausea or vomiting. Patient not taking: Reported on 01/14/2015 12/09/14   Theodis Blaze, MD   Physical Exam: Filed Vitals:   02/16/15 1258 02/16/15 1437 02/16/15 1549  BP: 83/41 79/42 98/55   Pulse: 126 108 109  Temp: 97.9 F (36.6 C) 98.1 F (36.7 C) 98 F (36.7 C)  TempSrc: Oral Oral Oral  Resp: 20 18 18   SpO2: 100% 100% 100%   EOMI NCAT frail habitus, Normal dentition, no pallor no icterus Multiple tattoos right upper arm right abdomen left abdomen Ileostomy noted right lower quadrant S1-S2 tachycardic Clinically clear chest no added sound Neurologically intact Flat  affect  Labs on Admission:  Basic Metabolic Panel:  Recent Labs Lab 02/16/15 1310 02/16/15 1318  NA 128*  --   K 3.2*  --   CL 96  --   CO2 18*  --   GLUCOSE 104*  --   BUN 53*  --   CREATININE 2.69*  --   CALCIUM 9.1  --   MG  --  2.6*   Liver Function Tests:  Recent Labs Lab 02/16/15 1310  AST 17  ALT 14  ALKPHOS 93  BILITOT 0.7  PROT 8.5*  ALBUMIN 4.9    Recent Labs Lab 02/16/15 1310  LIPASE 44  No results for input(s): AMMONIA in the last 168 hours. CBC:  Recent Labs Lab 02/16/15 1310  WBC 10.2  NEUTROABS 7.6  HGB 12.5  HCT 35.2*  MCV 81.5  PLT 419*   Cardiac Enzymes: No results for input(s): CKTOTAL, CKMB, CKMBINDEX, TROPONINI in the last 168 hours.  BNP (last 3 results) No results for input(s): BNP in the last 8760 hours.  ProBNP (last 3 results)  Recent Labs  05/13/14 1205  PROBNP 71.8    CBG: No results for input(s): GLUCAP in the last 168 hours.  Radiological Exams on Admission: No results found.    Assessment/Plan Principal Problem:   Gastroenteritis-likely viral versus pyelonephritis--she has been admitted multiple times before for urinary tract infections and she may suffer from pyelonephritis as well and we will get an in and out catheterization specimen and a culture. At this stage however I would hold off on antibiotics as she does not appear to have a white count or any other concerns at this time. It may be reasonable to speak to general surgery about how to prevent her from having these issues. I did not consult them as I felt we could manage her symptomatically at present    Multifactorial  Metabolic acidosis with resultant hypokalemia, hypovolemic hyponatremia -secondary to chloride loss as well as high output from her ostomy.  We'll aggressively replace fluid with IV saline at 200 cc per hour for now-I have asked that the emergency room obtain large-bore access .   We may eventually need to start IV bicarbonate as  she has a mild metabolic acidosis She will need saline copiously for now only I will repeat a complete metabolic panel by 6 PM and make that decision at that time   Constitutional hypotension-see above discussion    Microcytic anemia-+ /-potential B12 component deficiency as she has had a colectomy. Outpatient management by oncology    Acute renal failure-see above discussion    Intractable nausea and vomiting--monitor. Continue Phenergan for now   Time spent: 75 minutes  [facility] Pager [phone]  If 7PM-7AM, please contact night-coverage [email or web address] Password [email or web address] 02/16/2015, 3:53 PM

## 2015-02-16 NOTE — ED Provider Notes (Signed)
CSN: [id number]     Arrival date & time 02/16/15  1225 History   First MD Initiated Contact with Patient 02/16/15 1334     Chief Complaint  Patient presents with  . Emesis  . Abdominal Pain     (Consider location/radiation/quality/duration/timing/severity/associated sxs/prior Treatment) HPI   Cindy [name] is a 24 y.o. female past medical history significant for colon cancer status post resection with ostomy  complaining of 6 episodes of nonbloody, nonbilious, no coffee-ground emesis onset last night associated with a far left upper and lower quadrant abdominal pain onset today which she rates it 8 out of 10. She states that the output from the ostomy is slightly more voluminous than normal. She denies fever, chills, sick contacts, dysuria, hematuria   Past Medical History  Diagnosis Date  . UTI (lower urinary tract infection)   . Heart murmur   . Pneumonia 2008; 2012  . Complication of anesthesia     "I swallowed the anesthesia when appendix taken out; got pneumonia"  . Iron deficiency anemia   . History of blood transfusion 04/26/2013    "4 bags" (05/21/2013)  . Colon cancer 2014    s/p resection and ileostomy  . 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 FAP (familial adenomatous polyposis) 09/30/2014  . Nephrolithiasis 09/30/2014   Past Surgical History  Procedure Laterality Date  . Leg reconstruction using fascial flap  ~ 2009  . Still born baby  03/22/13    delivered vaginally @ ~ 24 weeks  . Appendectomy  2008  . Colonoscopy N/A 05/24/2013    Procedure: COLONOSCOPY;  Surgeon: Missy Sabins, MD;  Location: [place];  Service: Endoscopy;  Laterality: N/A;  . Colon surgery    . Ileostomy  06/26/2013  . I&d extremity Left 11/14/2013    Procedure: IRRIGATION AND DEBRIDEMENT LEFT LONG FINGER;  Surgeon: Tennis Must, MD;  Location: [place];  Service: Orthopedics;  Laterality: Left;   Family History  Problem Relation Age of Onset  .  Adopted: Yes  . Alcohol abuse Neg Hx   . Arthritis Neg Hx   . Asthma Neg Hx   . Birth defects Neg Hx   . Cancer Neg Hx   . COPD Neg Hx   . Depression Neg Hx   . Diabetes Neg Hx   . Drug abuse Neg Hx   . Early death Neg Hx   . Hearing loss Neg Hx   . Heart disease Neg Hx   . Hyperlipidemia Neg Hx   . Hypertension Neg Hx   . Kidney disease Neg Hx   . Learning disabilities Neg Hx   . Mental illness Neg Hx   . Mental retardation Neg Hx   . Miscarriages / Stillbirths Neg Hx   . Stroke Neg Hx   . Vision loss Neg Hx   . ALS Mother    History  Substance Use Topics  . Smoking status: Never Smoker   . Smokeless tobacco: Never Used  . Alcohol Use: No   OB History    Gravida Para Term Preterm AB TAB SAB Ectopic Multiple Living   1 1  1            Review of Systems  10 systems reviewed and found to be negative, except as noted in the HPI.  Allergies  Soap; Orange fruit; and Levaquin  Home Medications   Prior to Admission medications   Medication Sig Start Date End  Date Taking? Authorizing Provider  EPINEPHrine 0.3 mg/0.3 mL IJ SOAJ injection Inject 0.3 mg into the muscle as needed (allergic reaction.). 08/10/13  Yes Sharmon Leyden, MD  potassium chloride (K-DUR) 10 MEQ tablet Take 4 tab twice a day for 4 days. Then 2 tabs daily. Patient taking differently: Take 10 mEq by mouth 2 (two) times daily.  01/16/15  Yes Charlynne Cousins, MD  lactose free nutrition (BOOST PLUS) LIQD Take 237 mLs by mouth 2 (two) times daily between meals. Patient not taking: Reported on 12/06/2014 09/09/14   Robbie Lis, MD  Loperamide HCl (IMODIUM A-D) 1 MG/7.5ML LIQD Take 15 mLs (2 mg total) by mouth every 6 (six) hours as needed. Patient taking differently: Take 15 mLs by mouth every 6 (six) hours as needed (for diarrhea).  07/12/14   Venetia Maxon Rama, MD  oxyCODONE-acetaminophen (PERCOCET/ROXICET) 5-325 MG per tablet Take 1-2 tablets by mouth every 6 (six) hours as needed for severe pain. Patient not  taking: Reported on 01/14/2015 12/09/14   Theodis Blaze, MD  promethazine (PHENERGAN) 12.5 MG tablet Take 1 tablet (12.5 mg total) by mouth every 6 (six) hours as needed for nausea or vomiting. Patient not taking: Reported on 01/14/2015 12/09/14   Theodis Blaze, MD   BP 79/42 mmHg  Pulse 108  Temp(Src) 98.1 F (36.7 C) (Oral)  Resp 18  SpO2 100% Physical Exam  Constitutional: She is oriented to person, place, and time. She appears well-developed and well-nourished. No distress.  HENT:  Head: Normocephalic.  Dry mucous membranes  Eyes: Conjunctivae and EOM are normal.  Cardiovascular:  tachycardic  Pulmonary/Chest: Effort normal and breath sounds normal. No stridor. No respiratory distress. She has no wheezes. She exhibits no tenderness.  Abdominal: Soft. Bowel sounds are normal. She exhibits no distension and no mass. There is tenderness. There is guarding. There is no rebound.  Ostomy with green/brown output  Tender palpation in the left upper and lower quadrant with voluntary guarding.  Genitourinary:  Left CVA tenderness to palpation  Musculoskeletal: Normal range of motion.  Neurological: She is alert and oriented to person, place, and time.  Psychiatric: She has a normal mood and affect.  Nursing note and vitals reviewed.   ED Course  Procedures (including critical care time)  CRITICAL CARE Performed by: Monico Blitz   Total critical care time: 40  Critical care time was exclusive of separately billable procedures and treating other patients.  Critical care was necessary to treat or prevent imminent or life-threatening deterioration.  Critical care was time spent personally by me on the following activities: development of treatment plan with patient and/or surrogate as well as nursing, discussions with consultants, evaluation of patient's response to treatment, examination of patient, obtaining history from patient or surrogate, ordering and performing treatments  and interventions, ordering and review of laboratory studies, ordering and review of radiographic studies, pulse oximetry and re-evaluation of patient's condition.  Labs Review Labs Reviewed  CBC WITH DIFFERENTIAL/PLATELET - Abnormal; Notable for the following:    HCT 35.2 (*)    Platelets 419 (*)    All other components within normal limits  COMPREHENSIVE METABOLIC PANEL - Abnormal; Notable for the following:    Sodium 128 (*)    Potassium 3.2 (*)    CO2 18 (*)    Glucose, Bld 104 (*)    BUN 53 (*)    Creatinine, Ser 2.69 (*)    Total Protein 8.5 (*)    GFR calc non Af Wyvonnia Lora  23 (*)    GFR calc Af Amer 27 (*)    All other components within normal limits  URINALYSIS, ROUTINE W REFLEX MICROSCOPIC - Abnormal; Notable for the following:    APPearance CLOUDY (*)    All other components within normal limits  MAGNESIUM - Abnormal; Notable for the following:    Magnesium 2.6 (*)    All other components within normal limits  LIPASE, BLOOD  I-STAT CG4 LACTIC ACID, ED  I-STAT BETA HCG BLOOD, ED ([facility], [facility], [facility] ONLY)    Imaging Review No results found.   EKG Interpretation None      MDM   Final diagnoses:  Hyponatremia  Hypokalemia  Non-intractable vomiting with nausea, vomiting of unspecified type  Dehydration  Acute-on-chronic kidney injury    Filed Vitals:   02/16/15 1258 02/16/15 1437  BP: 83/41 79/42  Pulse: 126 108  Temp: 97.9 F (36.6 C) 98.1 F (36.7 C)  TempSrc: Oral Oral  Resp: 20 18  SpO2: 100% 100%    Medications  potassium chloride 10 mEq in 100 mL IVPB (10 mEq Intravenous New Bag/Given 02/16/15 1441)  morphine 4 MG/ML injection 4 mg (not administered)  sodium chloride 0.9 % bolus 1,000 mL (1,000 mLs Intravenous New Bag/Given 02/16/15 1440)  sodium chloride 0.9 % bolus 1,000 mL (1,000 mLs Intravenous New Bag/Given 02/16/15 1441)  sodium chloride 0.9 % bolus 500 mL (500 mLs Intravenous New Bag/Given 02/16/15 1518)    Cindy [name] is a pleasant 24 y.o.  female presenting with several episodes of emesis onset last night with associated increased ostomy output. Patient is afebrile, appears dehydrated. She is tachycardic to the 120s and her blood pressure soft at 83/41. Lactic acid is normal, she has no white blood cell count, patient is afebrile in the ED. Chemistries were significant abnormalities: Hyponatremic to 128, potassium is 3.2, bicarbonate is elevated to 18. Creatinine is 2.69, this is well above her baseline, her BUN is elevated as well. Normal UA and magnesium.   Patient given 2 IVs and aggressive hydration initiated. Case discussed with triad hospitalist Dr. Verlon Au who will admit the patient to a telemetry bed. Serial abdominal exams remain nonsurgical. Tachycardia is resolving however blood pressure remains soft at 79/42 and the ED.   This is a shared visit with the attending physician who personally evaluated the patient and agrees with the care plan.         Monico Blitz, PA-C 02/16/15 [place], MD 02/16/15 [phone]

## 2015-02-16 NOTE — ED Notes (Signed)
Attempted IV access x2, both unsuccessful.

## 2015-02-16 NOTE — ED Notes (Signed)
Pt reports N/V since yesterday, has thrown up 6x in last 24 hours. Pt able to keep down fluids, nausea with food. Pt has ostomy bag which has increased drainage from normal. Pt also began to have L side abd pain that began this am.

## 2015-02-17 DIAGNOSIS — R112 Nausea with vomiting, unspecified: Secondary | ICD-10-CM

## 2015-02-17 DIAGNOSIS — N189 Chronic kidney disease, unspecified: Secondary | ICD-10-CM

## 2015-02-17 DIAGNOSIS — E860 Dehydration: Secondary | ICD-10-CM

## 2015-02-17 DIAGNOSIS — N179 Acute kidney failure, unspecified: Secondary | ICD-10-CM

## 2015-02-17 DIAGNOSIS — D509 Iron deficiency anemia, unspecified: Secondary | ICD-10-CM

## 2015-02-17 DIAGNOSIS — E876 Hypokalemia: Secondary | ICD-10-CM

## 2015-02-17 LAB — CBC
HCT: 25.4 % — ABNORMAL LOW (ref 36.0–46.0)
HEMOGLOBIN: 9 g/dL — AB (ref 12.0–15.0)
MCH: 29.6 pg (ref 26.0–34.0)
MCHC: 35.4 g/dL (ref 30.0–36.0)
MCV: 83.6 fL (ref 78.0–100.0)
PLATELETS: 203 10*3/uL (ref 150–400)
RBC: 3.04 MIL/uL — ABNORMAL LOW (ref 3.87–5.11)
RDW: 14.1 % (ref 11.5–15.5)
WBC: 5.6 10*3/uL (ref 4.0–10.5)

## 2015-02-17 LAB — COMPREHENSIVE METABOLIC PANEL
ALBUMIN: 3.2 g/dL — AB (ref 3.5–5.2)
ALK PHOS: 58 U/L (ref 39–117)
ALT: 11 U/L (ref 0–35)
ANION GAP: 8 (ref 5–15)
AST: 18 U/L (ref 0–37)
BILIRUBIN TOTAL: 0.7 mg/dL (ref 0.3–1.2)
BUN: 29 mg/dL — ABNORMAL HIGH (ref 6–23)
CO2: 19 mmol/L (ref 19–32)
Calcium: 7.6 mg/dL — ABNORMAL LOW (ref 8.4–10.5)
Chloride: 109 mmol/L (ref 96–112)
Creatinine, Ser: 1.46 mg/dL — ABNORMAL HIGH (ref 0.50–1.10)
GFR calc Af Amer: 57 mL/min — ABNORMAL LOW (ref >90)
GFR calc non Af Amer: 49 mL/min — ABNORMAL LOW (ref >90)
Glucose, Bld: 95 mg/dL (ref 70–99)
POTASSIUM: 3.4 mmol/L — AB (ref 3.5–5.1)
Sodium: 136 mmol/L (ref 135–145)
Total Protein: 5.6 g/dL — ABNORMAL LOW (ref 6.0–8.3)

## 2015-02-17 LAB — MAGNESIUM: MAGNESIUM: 1.8 mg/dL (ref 1.5–2.5)

## 2015-02-17 MED ORDER — SODIUM CHLORIDE 0.9 % IV BOLUS (SEPSIS)
1000.0000 mL | Freq: Once | INTRAVENOUS | Status: AC
  Administered 2015-02-17: 1000 mL via INTRAVENOUS

## 2015-02-17 MED ORDER — POTASSIUM CHLORIDE 20 MEQ/15ML (10%) PO SOLN
20.0000 meq | Freq: Every day | ORAL | Status: DC
  Administered 2015-02-17: 20 meq via ORAL
  Filled 2015-02-17: qty 15

## 2015-02-17 MED ORDER — ONDANSETRON 8 MG PO TBDP: 8.0000 mg | ORAL_TABLET | Freq: Three times a day (TID) | ORAL | Status: DC | PRN

## 2015-02-17 MED ORDER — POTASSIUM CHLORIDE ER 10 MEQ PO TBCR: EXTENDED_RELEASE_TABLET | ORAL | Status: DC

## 2015-02-17 MED ORDER — POTASSIUM CHLORIDE CRYS ER 20 MEQ PO TBCR
40.0000 meq | EXTENDED_RELEASE_TABLET | Freq: Every day | ORAL | Status: DC
  Filled 2015-02-17: qty 2

## 2015-02-17 MED ORDER — ONE DAILY PLUS IRON PO TABS: 1.0000 | ORAL_TABLET | Freq: Every day | ORAL | Status: DC

## 2015-02-17 MED ORDER — LOPERAMIDE HCL 1 MG/7.5ML PO LIQD: 15.0000 mL | Freq: Four times a day (QID) | ORAL | Status: DC | PRN

## 2015-02-17 MED ORDER — ENSURE ENLIVE PO LIQD
237.0000 mL | Freq: Two times a day (BID) | ORAL | Status: DC
  Administered 2015-02-17: 237 mL via ORAL

## 2015-02-17 NOTE — Care Management Note (Signed)
    Page 1 of 1   02/17/2015     4:16:21 PM CARE MANAGEMENT NOTE 02/17/2015  Patient:  Cindy [name], Cindy [name]   Account Number:  [id number]  Date Initiated:  02/17/2015  Documentation initiated by:  Dessa Phi  Subjective/Objective Assessment:   24 y/o f admitted w/gatroentiritis.     Action/Plan:   From home.   Anticipated DC Date:  02/19/2015   Anticipated DC Plan:  [place]  CM consult      Choice offered to / List presented to:             Status of service:  In process, will continue to follow Medicare Important Message given?   (If response is "NO", the following Medicare IM given date fields will be blank) Date Medicare IM given:   Medicare IM given by:   Date Additional Medicare IM given:   Additional Medicare IM given by:    Discharge Disposition:    Per UR Regulation:  Reviewed for med. necessity/level of care/duration of stay  If discussed at [place] of Stay Meetings, dates discussed:    Comments:  02/17/15 Dessa Phi RN BSN NCM [id number] No anticipated d/c needs.

## 2015-02-17 NOTE — Progress Notes (Signed)
Hgb dropped from 12.5 to 9.0 from yesterday morning. No signs of active bleeding. NP on call made aware. No new orders placed. Will pass information to oncoming nurse.

## 2015-02-17 NOTE — Progress Notes (Signed)
INITIAL NUTRITION ASSESSMENT  DOCUMENTATION CODES Per approved criteria  -Not Applicable   INTERVENTION: Provide Ensure Enlive po BID, each supplement provides 350 kcal and 20 grams of protein Provided "Ileostomy Nutrition Therapy" education handout from [employer] Encouraged PO intake RD to continue to monitor  NUTRITION DIAGNOSIS: Altered GI function related to ileostomy as evidenced by high ostomy output.   Goal: Pt to meet >/= 90% of their estimated nutrition needs   Monitor:  PO and supplemental intake, weight, labs, I/O's  Reason for Assessment: Consult for pt with ostomy  Admitting Dx: Gastroenteritis  ASSESSMENT: Pt with hx familial polyposis, colon ca, s/p colon resection and ileostomy, c/o several episodes nausea and vomiting, and mildly increased ostomy output in the past 1-2 days.   Pt reports eating well PTA with no changes in appetite despite N/V. PO intake of Clear liquids: 20%. Per H&P, pt reports eating questionable foods at a friend's house. Pt with high ostomy output PTA. Current output: 75-85 ml.   Pt with ileostomy since 2014. Pt was provided ileostomy nutrition education in November 2015. Provided pt with "Ileostomy Nutrition Therapy" education handout from [employer]. Reviewed hydration and food to assist in decreasing ostomy output. Pt states she has been having diarrhea. Reviewed foods to assist with thickening stools.  Pt has had 10 lb weight loss since 1/13 (9% weight loss x 2 months, significant for time frame).  Pt states that she would like Ensure supplements. RD to order.  Nutrition focused physical exam shows no sign of depletion of muscle mass or body fat.  Labs reviewed: Low K Elevated BUN & Creatinine  Height: Ht Readings from Last 1 Encounters:  02/16/15 5\' 1"  (1.549 m)    Weight: Wt Readings from Last 1 Encounters:  02/16/15 100 lb (45.36 kg)    Ideal Body Weight: 105 lb  %  Ideal Body Weight: 95%  Wt Readings from Last 10 Encounters:  02/16/15 100 lb (45.36 kg)  01/16/15 104 lb 4.4 oz (47.3 kg)  12/09/14 110 lb 14.3 oz (50.3 kg)  10/02/14 113 lb 15.7 oz (51.7 kg)  09/08/14 105 lb 8 oz (47.854 kg)  07/11/14 105 lb 6.4 oz (47.809 kg)  02/16/14 105 lb 6 oz (47.798 kg)  01/28/14 107 lb (48.535 kg)  01/19/14 107 lb (48.535 kg)  12/25/13 97 lb (43.999 kg)    Usual Body Weight: 105 lb  % Usual Body Weight: 95%  BMI:  Body mass index is 18.9 kg/(m^2).  Estimated Nutritional Needs: Kcal: 1350-1550 Protein: 65-75g Fluid: 1.5L/day  Skin: intact  Diet Order: Diet full liquid  EDUCATION NEEDS: -Education needs addressed   Intake/Output Summary (Last 24 hours) at 02/17/15 1123 Last data filed at 02/17/15 0909  Gross per 24 hour  Intake 1577.5 ml  Output   1130 ml  Net  447.5 ml    Last BM: 3/22 -ostomy RLQ  Labs:   Recent Labs Lab 02/16/15 1310 02/16/15 1318 02/16/15 1725 02/17/15 0500  NA 128*  --  131* 136  K 3.2*  --  3.2* 3.4*  CL 96  --  105 109  CO2 18*  --  15* 19  BUN 53*  --  44* 29*  CREATININE 2.69*  --  2.12* 1.46*  CALCIUM 9.1  --  7.5* 7.6*  MG  --  2.6*  --  1.8  GLUCOSE 104*  --  99 95    CBG (last 3)  No results for input(s):  GLUCAP in the last 72 hours.  Scheduled Meds: . feeding supplement (ENSURE ENLIVE)  237 mL Oral BID BM  . heparin  5,000 Units Subcutaneous 3 times per day  . loperamide  2 mg Oral BID  . potassium chloride  20 mEq Oral Daily    Continuous Infusions: . dextrose 5 % 1,000 mL with potassium chloride 40 mEq, sodium bicarbonate 100 mEq infusion 75 mL/hr at 02/17/15 0502    Past Medical History  Diagnosis Date  . UTI (lower urinary tract infection)   . Heart murmur   . Pneumonia 2008; 2012  . Complication of anesthesia     "I swallowed the anesthesia when appendix taken out; got pneumonia"  . Iron deficiency anemia   . History of blood transfusion 04/26/2013    "4 bags" (05/21/2013)   . Colon cancer 2014    s/p resection and ileostomy  . 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 FAP (familial adenomatous polyposis) 09/30/2014  . Nephrolithiasis 09/30/2014    Past Surgical History  Procedure Laterality Date  . Leg reconstruction using fascial flap  ~ 2009  . Still born baby  03/22/13    delivered vaginally @ ~ 24 weeks  . Appendectomy  2008  . Colonoscopy N/A 05/24/2013    Procedure: COLONOSCOPY;  Surgeon: Missy Sabins, MD;  Location: [facility];  Service: Endoscopy;  Laterality: N/A;  . Colon surgery    . Ileostomy  06/26/2013  . I&d extremity Left 11/14/2013    Procedure: IRRIGATION AND DEBRIDEMENT LEFT LONG FINGER;  Surgeon: Tennis Must, MD;  Location: [facility];  Service: Orthopedics;  Laterality: Left;    Clayton Bibles, MS, RD, LDN Pager: [phone] After Hours Pager: [phone]

## 2015-02-17 NOTE — Discharge Summary (Signed)
Physician Discharge Summary  Cindy [name] [id number] DOB: 02/06/1991 DOA: 02/16/2015  PCP: Vidal Schwalbe, MD  Admit date: 02/16/2015 Discharge date: 02/17/2015  Time spent: >30 minutes  Recommendations for Outpatient Follow-up:  1. Please repeat basic metabolic panel to follow electrolytes and renal function 2. Repeat CBC to follow hemoglobin trend 3. Reassess patient's symptoms and assure complete resolution  Discharge Diagnoses:  Gastroenteritis-likely viral Microcytic anemia Acute on chronic renal failure Hyponatremia  Hypokalemia Metabolic acidosis Non-Intractable nausea and vomiting History of colon cancer status post colectomy and colostomy  Discharge Condition: Stable and improved. Patient denies any nausea, vomiting, shortness of breath, chest pain, palpitations or any other acute complaints. She was able to keep medications and diet down prior to discharge. She will be discharged home with instructions to set up follow-up appointment with PCP in 10 days.  Diet recommendation: Low residue diet, also instructed to avoid fast food, too much grease and overs seasoned food  Filed Weights   02/16/15 1633  Weight: 45.36 kg (100 lb)    History of present illness:  24 y.o. female past medical history significant for colon cancer status post resection with ostomy; who presented to ED complaining of nausea, vomiting and bad pain. Patient reported 6 episodes of nonbloody, nonbilious, no coffee-ground emesis, onset night prior to admission and was associated with left upper and lower quadrant abdominal pain. Patient endorses pain is 8-10 in intensity. She endorses that the output from the ostomy is slightly more voluminous than normal. She denies fever, chills, sick contacts, dysuria, hematuria or any other complaints.  Hospital Course:  1-nausea/vomiting and diarrhea: due to gastroenteritis -viral gastroenteritis most likely -patient symptoms significantly improved with fluid  resuscitation and supportive care -no fever, no further vomiting and able to tolerate diet -will discharge with prescriptions for PRN antiemetics -advise to keep herself hydrated -low residue diet  -will follow up with PCP in 10 days  2-moderate protein calorie malnutrition: encourage to take boost BID as recommended by dietitian -patient also advise to avoid greasy food, fast food and diet with to much seasoning   3-Dehydration and hyponatremia: Appears to be secondary to decreased by mouth intake and increase vomiting/diarrhea. -Essentially resolved after IV fluid resuscitation given -Patient was now able to keep food and medications down without any problem -Encourage keep herself well-hydrated -Will follow up with PCP in 10 days; at that time patient will require to have basic metabolic panel repeated in order to follow on electrolytes   4-hypokalemia: Secondary to decreased by mouth intake and diarrhea -Associated with gastroenteritis -No further episode of diarrhea appreciated -Patient will continue using as needed Imodium to control her intestinal transit -Continue maintenance supplementation of potassium  5-acute on chronic renal failure: Secondary to prerenal azotemia due to dehydration -After fluid resuscitation provided patient kidney function returned back to baseline -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 -Patient acidosis no longer an issue at the moment of discharge.  7-history of colon cancer status post colectomy with colostomy: Appears to be a stable and currently in remission. -Patient instructed to follow as prior to admission indicated, with her oncology and general surgery service as an outpatient for further evaluation  and treatment.  *The rest of patient's medical problems remains stable and the plan is to continue same medication  regimen. She will follow with her PCP and also with her Gen. surgery and oncology for follow-up.  Procedures:  See below for x-ray report   Consultations:  None   Discharge Exam: Filed Vitals:   02/17/15 1351  BP: 90/50  Pulse: 101  Temp: 97.3 F (36.3 C)  Resp: 16    General: afebrile, feeling better; denies any nausea, vomiting, CP or SOB. Able to tolerate diet  Cardiovascular: slight sinus tachycardia, S1 and S2, no rubs or gallops Respiratory: CTA bilaterally Abd: soft, no distended, positive BS; colostomy bag w/o signs of infection and with fecal material/ air inside; no signs of blood Extremities: no edema or cyanosis   Discharge Instructions   Discharge Instructions    Diet - low sodium heart healthy    Complete by:  As directed      Discharge instructions    Complete by:  As directed   Take medications as prescribed Keep yourself well hydrated Follow with PCP in 10 days Arrange follow up visit with general surgeon and oncology service as set up prior to admission          Current Discharge Medication List    START taking these medications   Details  Multiple Vitamins-Minerals (ONE DAILY PLUS IRON) TABS Take 1 tablet by mouth daily. Qty: 30 each, Refills: 1    ondansetron (ZOFRAN ODT) 8 MG disintegrating tablet Take 1 tablet (8 mg total) by mouth every 8 (eight) hours as needed for nausea or vomiting. Qty: 20 tablet, Refills: 0      CONTINUE these medications which have CHANGED   Details  Loperamide HCl (IMODIUM A-D) 1 MG/7.5ML LIQD Take 15 mLs (2 mg total) by mouth every 6 (six) hours as needed (for diarrhea). Qty: 150 mL, Refills: 0    potassium chloride (K-DUR) 10 MEQ tablet Take 3 tabs daily. Qty: 90 tablet, Refills: 0      CONTINUE these medications which have NOT CHANGED   Details  EPINEPHrine 0.3 mg/0.3 mL IJ SOAJ injection  Inject 0.3 mg into the muscle as needed (allergic reaction.).    lactose free nutrition (BOOST PLUS) LIQD Take 237 mLs by mouth 2 (two) times daily between meals. Qty: 237 mL, Refills: 0    promethazine (PHENERGAN) 12.5 MG tablet Take 1 tablet (12.5 mg total) by mouth every 6 (six) hours as needed for nausea or vomiting. Qty: 65 tablet, Refills: 0      STOP taking these medications     oxyCODONE-acetaminophen (PERCOCET/ROXICET) 5-325 MG per tablet        Allergies  Allergen Reactions  . Soap Hives and Itching    *Dove Soap*  . 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 Levaquin [Levofloxacin] Hives   Follow-up Information    Follow up with WHITE,CYNTHIA S, MD. Schedule an appointment as soon as possible for a visit in 10 days.   Specialty:  Family Medicine   Contact information:   [place] [address] [phone]       The results of significant diagnostics from this hospitalization (including imaging, microbiology, ancillary and laboratory) are listed below for reference.     Labs: Basic Metabolic Panel:  Recent Labs Lab 02/16/15 1310 02/16/15 1318 02/16/15 1725 02/17/15 0500  NA 128*  --  131* 136  K 3.2*  --  3.2* 3.4*  CL 96  --  105 109  CO2 18*  --  15* 19  GLUCOSE 104*  --  99 95  BUN  53*  --  44* 29*  CREATININE 2.69*  --  2.12* 1.46*  CALCIUM 9.1  --  7.5* 7.6*  MG  --  2.6*  --  1.8   Liver Function Tests:  Recent Labs Lab 02/16/15 1310 02/17/15 0500  AST 17 18  ALT 14 11  ALKPHOS 93 58  BILITOT 0.7 0.7  PROT 8.5* 5.6*  ALBUMIN 4.9 3.2*    Recent Labs Lab 02/16/15 1310  LIPASE 44   CBC:  Recent Labs Lab 02/16/15 1310 02/17/15 0500  WBC 10.2 5.6  NEUTROABS 7.6  --   HGB 12.5 9.0*  HCT 35.2* 25.4*  MCV 81.5 83.6  PLT 419* 203   ProBNP (last 3 results)  Recent Labs  05/13/14 1205  PROBNP 71.8    Signed:  Barton Dubois  [facility] 02/17/2015, 4:44 PM

## 2015-02-17 NOTE — Progress Notes (Signed)
BP 71/36. Patient asymptomatic. NP on call notified. New order placed. 1 L NS bolus infusing. Will recheck BP once complete. Will continue to monitor patient closely.

## 2015-04-30 ENCOUNTER — Encounter (HOSPITAL_COMMUNITY): Payer: Self-pay | Admitting: Emergency Medicine

## 2015-04-30 ENCOUNTER — Other Ambulatory Visit: Payer: Self-pay

## 2015-04-30 ENCOUNTER — Inpatient Hospital Stay (HOSPITAL_COMMUNITY)
Admission: EM | Admit: 2015-04-30 | Discharge: 2015-05-03 | DRG: 683 | Disposition: A | Discharge status: Home / Self Care | Payer: 59 | Attending: Internal Medicine | Admitting: Internal Medicine

## 2015-04-30 ENCOUNTER — Other Ambulatory Visit (HOSPITAL_COMMUNITY): Payer: Self-pay

## 2015-04-30 DIAGNOSIS — E876 Hypokalemia: Secondary | ICD-10-CM | POA: Diagnosis present

## 2015-04-30 DIAGNOSIS — Z85038 Personal history of other malignant neoplasm of large intestine: Secondary | ICD-10-CM

## 2015-04-30 DIAGNOSIS — E860 Dehydration: Secondary | ICD-10-CM | POA: Diagnosis present

## 2015-04-30 DIAGNOSIS — N189 Chronic kidney disease, unspecified: Secondary | ICD-10-CM | POA: Diagnosis present

## 2015-04-30 DIAGNOSIS — D126 Benign neoplasm of colon, unspecified: Secondary | ICD-10-CM | POA: Diagnosis not present

## 2015-04-30 DIAGNOSIS — R198 Other specified symptoms and signs involving the digestive system and abdomen: Secondary | ICD-10-CM | POA: Diagnosis not present

## 2015-04-30 DIAGNOSIS — Z79899 Other long term (current) drug therapy: Secondary | ICD-10-CM

## 2015-04-30 DIAGNOSIS — N179 Acute kidney failure, unspecified: Principal | ICD-10-CM | POA: Diagnosis present

## 2015-04-30 DIAGNOSIS — Z881 Allergy status to other antibiotic agents status: Secondary | ICD-10-CM | POA: Diagnosis not present

## 2015-04-30 DIAGNOSIS — Z87442 Personal history of urinary calculi: Secondary | ICD-10-CM | POA: Diagnosis not present

## 2015-04-30 DIAGNOSIS — Z9049 Acquired absence of other specified parts of digestive tract: Secondary | ICD-10-CM | POA: Diagnosis present

## 2015-04-30 DIAGNOSIS — N390 Urinary tract infection, site not specified: Secondary | ICD-10-CM | POA: Diagnosis present

## 2015-04-30 DIAGNOSIS — R1110 Vomiting, unspecified: Secondary | ICD-10-CM

## 2015-04-30 DIAGNOSIS — D649 Anemia, unspecified: Secondary | ICD-10-CM | POA: Diagnosis present

## 2015-04-30 DIAGNOSIS — Z932 Ileostomy status: Secondary | ICD-10-CM

## 2015-04-30 DIAGNOSIS — E861 Hypovolemia: Secondary | ICD-10-CM | POA: Diagnosis present

## 2015-04-30 DIAGNOSIS — E872 Acidosis: Secondary | ICD-10-CM | POA: Diagnosis present

## 2015-04-30 DIAGNOSIS — E871 Hypo-osmolality and hyponatremia: Secondary | ICD-10-CM | POA: Diagnosis present

## 2015-04-30 DIAGNOSIS — R112 Nausea with vomiting, unspecified: Secondary | ICD-10-CM | POA: Diagnosis present

## 2015-04-30 DIAGNOSIS — B961 Klebsiella pneumoniae [K. pneumoniae] as the cause of diseases classified elsewhere: Secondary | ICD-10-CM | POA: Diagnosis present

## 2015-04-30 DIAGNOSIS — C189 Malignant neoplasm of colon, unspecified: Secondary | ICD-10-CM | POA: Diagnosis present

## 2015-04-30 DIAGNOSIS — I959 Hypotension, unspecified: Secondary | ICD-10-CM | POA: Diagnosis present

## 2015-04-30 LAB — URINE MICROSCOPIC-ADD ON

## 2015-04-30 LAB — CBC WITH DIFFERENTIAL/PLATELET
BASOS PCT: 0 % (ref 0–1)
Basophils Absolute: 0 10*3/uL (ref 0.0–0.1)
EOS ABS: 0 10*3/uL (ref 0.0–0.7)
EOS PCT: 0 % (ref 0–5)
HEMATOCRIT: 40.9 % (ref 36.0–46.0)
HEMOGLOBIN: 14.7 g/dL (ref 12.0–15.0)
LYMPHS ABS: 2.2 10*3/uL (ref 0.7–4.0)
Lymphocytes Relative: 17 % (ref 12–46)
MCH: 29.1 pg (ref 26.0–34.0)
MCHC: 35.9 g/dL (ref 30.0–36.0)
MCV: 80.8 fL (ref 78.0–100.0)
Monocytes Absolute: 1 10*3/uL (ref 0.1–1.0)
Monocytes Relative: 7 % (ref 3–12)
Neutro Abs: 9.7 10*3/uL — ABNORMAL HIGH (ref 1.7–7.7)
Neutrophils Relative %: 76 % (ref 43–77)
PLATELETS: 495 10*3/uL — AB (ref 150–400)
RBC: 5.06 MIL/uL (ref 3.87–5.11)
RDW: 12.5 % (ref 11.5–15.5)
WBC: 12.9 10*3/uL — ABNORMAL HIGH (ref 4.0–10.5)

## 2015-04-30 LAB — URINALYSIS, ROUTINE W REFLEX MICROSCOPIC
Bilirubin Urine: NEGATIVE
GLUCOSE, UA: NEGATIVE mg/dL
KETONES UR: NEGATIVE mg/dL
NITRITE: POSITIVE — AB
PH: 5 (ref 5.0–8.0)
Protein, ur: NEGATIVE mg/dL
Specific Gravity, Urine: 1.007 (ref 1.005–1.030)
Urobilinogen, UA: 0.2 mg/dL (ref 0.0–1.0)

## 2015-04-30 LAB — I-STAT BETA HCG BLOOD, ED (MC, WL, AP ONLY)

## 2015-04-30 LAB — COMPREHENSIVE METABOLIC PANEL
ALK PHOS: 109 U/L (ref 38–126)
ALT: 17 U/L (ref 14–54)
AST: 22 U/L (ref 15–41)
Albumin: 5.2 g/dL — ABNORMAL HIGH (ref 3.5–5.0)
Anion gap: 18 — ABNORMAL HIGH (ref 5–15)
BUN: 56 mg/dL — AB (ref 6–20)
CALCIUM: 9.8 mg/dL (ref 8.9–10.3)
CO2: 16 mmol/L — ABNORMAL LOW (ref 22–32)
Chloride: 91 mmol/L — ABNORMAL LOW (ref 101–111)
Creatinine, Ser: 3.95 mg/dL — ABNORMAL HIGH (ref 0.44–1.00)
GFR calc Af Amer: 17 mL/min — ABNORMAL LOW (ref >60)
GFR calc non Af Amer: 15 mL/min — ABNORMAL LOW (ref >60)
Glucose, Bld: 124 mg/dL — ABNORMAL HIGH (ref 65–99)
POTASSIUM: 2.7 mmol/L — AB (ref 3.5–5.1)
Sodium: 125 mmol/L — ABNORMAL LOW (ref 135–145)
Total Bilirubin: 0.5 mg/dL (ref 0.3–1.2)
Total Protein: 10.2 g/dL — ABNORMAL HIGH (ref 6.5–8.1)

## 2015-04-30 LAB — LIPASE, BLOOD: Lipase: 46 U/L (ref 22–51)

## 2015-04-30 LAB — I-STAT CG4 LACTIC ACID, ED
LACTIC ACID, VENOUS: 1.95 mmol/L (ref 0.5–2.0)
LACTIC ACID, VENOUS: 1.2 mmol/L (ref 0.5–2.0)

## 2015-04-30 LAB — MAGNESIUM: Magnesium: 2.8 mg/dL — ABNORMAL HIGH (ref 1.7–2.4)

## 2015-04-30 LAB — PREGNANCY, URINE: Preg Test, Ur: NEGATIVE

## 2015-04-30 LAB — PHOSPHORUS: Phosphorus: 7.6 mg/dL — ABNORMAL HIGH (ref 2.5–4.6)

## 2015-04-30 MED ORDER — ACETAMINOPHEN 650 MG RE SUPP: 650.0000 mg | Freq: Four times a day (QID) | RECTAL | Status: DC | PRN

## 2015-04-30 MED ORDER — ONDANSETRON HCL 4 MG PO TABS
4.0000 mg | ORAL_TABLET | Freq: Four times a day (QID) | ORAL | Status: DC | PRN
Start: 2015-04-30 — End: 2015-05-03

## 2015-04-30 MED ORDER — MORPHINE SULFATE 2 MG/ML IJ SOLN
2.0000 mg | Freq: Once | INTRAMUSCULAR | Status: AC
  Administered 2015-04-30: 2 mg via INTRAVENOUS
  Filled 2015-04-30: qty 1

## 2015-04-30 MED ORDER — ACETAMINOPHEN 325 MG PO TABS: 650.0000 mg | ORAL_TABLET | Freq: Four times a day (QID) | ORAL | Status: DC | PRN

## 2015-04-30 MED ORDER — POTASSIUM CHLORIDE 10 MEQ/100ML IV SOLN
10.0000 meq | Freq: Once | INTRAVENOUS | Status: AC
  Administered 2015-04-30: 10 meq via INTRAVENOUS
  Filled 2015-04-30: qty 100

## 2015-04-30 MED ORDER — LOPERAMIDE HCL 1 MG/5ML PO LIQD
3.0000 mg | Freq: Four times a day (QID) | ORAL | Status: DC | PRN
  Filled 2015-04-30: qty 15

## 2015-04-30 MED ORDER — MORPHINE SULFATE 2 MG/ML IJ SOLN
2.0000 mg | Freq: Four times a day (QID) | INTRAMUSCULAR | Status: DC | PRN
  Administered 2015-04-30: 2 mg via INTRAVENOUS
  Filled 2015-04-30: qty 1

## 2015-04-30 MED ORDER — ENOXAPARIN SODIUM 30 MG/0.3ML ~~LOC~~ SOLN
30.0000 mg | SUBCUTANEOUS | Status: DC
  Administered 2015-05-01: 30 mg via SUBCUTANEOUS
  Filled 2015-04-30 (×3): qty 0.3

## 2015-04-30 MED ORDER — ENOXAPARIN SODIUM 40 MG/0.4ML ~~LOC~~ SOLN
40.0000 mg | SUBCUTANEOUS | Status: DC
  Filled 2015-04-30: qty 0.4

## 2015-04-30 MED ORDER — SODIUM CHLORIDE 0.9 % IV BOLUS (SEPSIS)
1000.0000 mL | Freq: Once | INTRAVENOUS | Status: AC
  Administered 2015-04-30: 1000 mL via INTRAVENOUS

## 2015-04-30 MED ORDER — OXYCODONE-ACETAMINOPHEN 5-325 MG PO TABS
1.0000 | ORAL_TABLET | ORAL | Status: DC | PRN
  Administered 2015-04-30 – 2015-05-01 (×3): 1 via ORAL
  Filled 2015-04-30 (×3): qty 1

## 2015-04-30 MED ORDER — BOOST / RESOURCE BREEZE PO LIQD
1.0000 | Freq: Two times a day (BID) | ORAL | Status: DC
  Administered 2015-04-30 – 2015-05-02 (×2): 1 via ORAL

## 2015-04-30 MED ORDER — POTASSIUM CHLORIDE IN NACL 40-0.9 MEQ/L-% IV SOLN
INTRAVENOUS | Status: DC
  Administered 2015-04-30 (×3): 175 mL/h via INTRAVENOUS
  Administered 2015-05-01: 100 mL/h via INTRAVENOUS
  Administered 2015-05-01: 175 mL/h via INTRAVENOUS
  Filled 2015-04-30 (×8): qty 1000

## 2015-04-30 MED ORDER — SODIUM CHLORIDE 0.9 % IJ SOLN
3.0000 mL | Freq: Two times a day (BID) | INTRAMUSCULAR | Status: DC
  Administered 2015-04-30 – 2015-05-03 (×4): 3 mL via INTRAVENOUS

## 2015-04-30 MED ORDER — HYOSCYAMINE SULFATE 0.5 MG/ML IJ SOLN
0.2500 mg | Freq: Four times a day (QID) | INTRAMUSCULAR | Status: DC | PRN
  Administered 2015-04-30: 0.25 mg via INTRAVENOUS
  Filled 2015-04-30 (×2): qty 0.5

## 2015-04-30 MED ORDER — ONDANSETRON HCL 4 MG/2ML IJ SOLN
4.0000 mg | Freq: Four times a day (QID) | INTRAMUSCULAR | Status: DC | PRN
  Administered 2015-04-30 – 2015-05-01 (×4): 4 mg via INTRAVENOUS
  Filled 2015-04-30 (×4): qty 2

## 2015-04-30 MED ORDER — MORPHINE SULFATE 2 MG/ML IJ SOLN
2.0000 mg | INTRAMUSCULAR | Status: DC | PRN
  Administered 2015-04-30 – 2015-05-01 (×5): 2 mg via INTRAVENOUS
  Filled 2015-04-30 (×6): qty 1

## 2015-04-30 NOTE — ED Notes (Signed)
Attempted to obtain blood sample x 2, another staff member attempted without success. Main lab phlebotomy tech been called to attempt blood collection.

## 2015-04-30 NOTE — Progress Notes (Signed)
Initial Nutrition Assessment  DOCUMENTATION CODES: - Unable to assess at this time  INTERVENTION: - Will order Boost Breeze BID - Will do physical assessment at follow-up -RD to continue to monitor for needs  NUTRITION DIAGNOSIS:  Inadequate oral intake related to nausea, vomiting as evidenced by estimated needs (need for CLD).  GOAL:  Patient will meet greater than or equal to 90% of their needs  MONITOR:  PO intake, Diet advancement, Supplement acceptance, Weight trends, Labs, I & O's  REASON FOR ASSESSMENT:  Consult  (Malnourishment)  ASSESSMENT: Per H&P, 24 y.o. 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 Has had 4 episodes of vomiting, small volume, with food particles---non bilious, non-bloody in appearance. The patient denies any changes to her ileostomy output.Nausea and vomiting has been associated with crampy abdominal pain.   Pt seen for consult. BMI indicates normal weight status. Pt on CLD since admission with no intakes documented. Will order Boost Breeze BID to supplement given vomiting PTA and need for repletion of nutrients and fluid.   Attempted to awake pt with name call x5 and shoulder rub x1, unsuccessful. No family or visitors in the room to provide information. Per weight hx review, pt's weight has been stable x16 months. Pt covered up to neck in blanket; did not perform physical assessment at this time but will do so on follow-up.  Unable to meet needs with CLD. Medications reviewed.  Labs: Na: 125 mmol/L, K: 2.7 mmol/L, Cl: 91 mmol/L, BUN/creatinine elevated, GFR: 15.  Height:  Ht Readings from Last 1 Encounters:  04/30/15 5\' 1"  (1.549 m)    Weight:  Wt Readings from Last 1 Encounters:   04/30/15 105 lb (47.628 kg)    Ideal Body Weight:  47.7 kg (kg)  Wt Readings from Last 10 Encounters:  04/30/15 105 lb (47.628 kg)  02/16/15 100 lb (45.36 kg)  01/16/15 104 lb 4.4 oz (47.3 kg)  12/09/14 110 lb 14.3 oz (50.3 kg)  10/02/14 113 lb 15.7 oz (51.7 kg)  09/08/14 105 lb 8 oz (47.854 kg)  07/11/14 105 lb 6.4 oz (47.809 kg)  02/16/14 105 lb 6 oz (47.798 kg)  01/28/14 107 lb (48.535 kg)  01/19/14 107 lb (48.535 kg)    BMI:  Body mass index is 19.85 kg/(m^2).  Estimated Nutritional Needs:  Kcal:  [phone]  Protein:  60-75 grams  Fluid:  2-2.5 L/day  Skin:  Reviewed, no issues  Diet Order:  Diet clear liquid Room service appropriate?: Yes; Fluid consistency:: Thin  EDUCATION NEEDS:  No education needs identified at this time  No intake or output data in the 24 hours ending 04/30/15 1339  Last BM:  PTA   Jarome Matin, RD, LDN Inpatient Clinical Dietitian Pager # [phone] After hours/weekend pager # [phone]

## 2015-04-30 NOTE — ED Provider Notes (Signed)
CSN: [id number]     Arrival date & time 04/30/15  0459 History   First MD Initiated Contact with Patient 04/30/15 [phone]     Chief Complaint  Patient presents with  . Emesis     (Consider location/radiation/quality/duration/timing/severity/associated sxs/prior Treatment) Patient is a 24 y.o. female presenting with vomiting. The history is provided by the patient. No language interpreter was used.  Emesis Associated symptoms: abdominal pain   Associated symptoms: no chills and no diarrhea   Jaydin E Donini is a 30 show female with past medical history of colon cancer with resection and ileostomy placement 2 years ago, UTI, heart murmur, iron deficiency anemia with history of blood transfusions, osteomyelitis presenting to the ED with nausea and vomiting that started yesterday. Patient reported that she had at least 3-4 episodes of emesis-NB/NB. Reported that she's been unable to keep any food or fluids down. Reported that she had 2 episodes of emesis this morning. Reported increased output in her ileostomy over the past 2 days. Reported decreased urination. Stated that she's been having abdominal pain localized all over her abdomen over the past 2 days, progressively getting worse. Denies taking any medications at home. Denied fever, chills, pelvic pain, dysuria, hematuria, vaginal bleeding, vaginal discharge, chest pain, shortness of breath, difficulty breathing, back pain, travel, sick contacts, melena, hematochezia. PCP Dr. Dema Severin  Past Medical History  Diagnosis Date  . UTI (lower urinary tract infection)   . Heart murmur   . Pneumonia 2008; 2012  . Complication of anesthesia     "I swallowed the anesthesia when appendix taken out; got pneumonia"  . Iron deficiency anemia   . History of blood transfusion 04/26/2013    "4 bags" (05/21/2013)  . Colon cancer 2014    s/p resection and ileostomy  . 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 Nephrolithiasis 09/30/2014   Past Surgical History  Procedure Laterality Date  . Leg reconstruction using fascial flap  ~ 2009  . Still born baby  [birth year]    delivered vaginally @ ~ 24 weeks  . Appendectomy  2008  . Colonoscopy N/A 05/24/2013    Procedure: COLONOSCOPY;  Surgeon: Missy Sabins, MD;  Location: [facility];  Service: Endoscopy;  Laterality: N/A;  . Colon surgery    . Ileostomy  06/26/2013  . I&d extremity Left 11/14/2013    Procedure: IRRIGATION AND DEBRIDEMENT LEFT LONG FINGER;  Surgeon: Tennis Must, MD;  Location: [facility];  Service: Orthopedics;  Laterality: Left;   Family History  Problem Relation Age of Onset  . Adopted: Yes  . Alcohol abuse Neg Hx   . Arthritis Neg Hx   . Asthma Neg Hx   . Birth defects Neg Hx   . Cancer Neg Hx   . COPD Neg Hx   . Depression Neg Hx   . Diabetes Neg Hx   . Drug abuse Neg Hx   . Early death Neg Hx   . Hearing loss Neg Hx   . Heart disease Neg Hx   . Hyperlipidemia Neg Hx   . Hypertension Neg Hx   . Kidney disease Neg Hx   . Learning disabilities Neg Hx   . Mental illness Neg Hx   . Mental retardation Neg Hx   . Miscarriages / Stillbirths Neg Hx   . Stroke Neg Hx   . Vision loss Neg Hx   . ALS Mother  History  Substance Use Topics  . Smoking status: Never Smoker   . Smokeless tobacco: Never Used  . Alcohol Use: No   OB History    Gravida Para Term Preterm AB TAB SAB Ectopic Multiple Living   1 1  1            Review of Systems  Constitutional: Negative for fever and chills.  Eyes: Negative for visual disturbance.  Respiratory: Negative for chest tightness and shortness of breath.   Cardiovascular: Negative for chest pain.  Gastrointestinal: Positive for nausea, vomiting and abdominal pain. Negative for diarrhea, constipation, blood in stool and anal bleeding.  Genitourinary: Positive for decreased urine volume. Negative for dysuria, hematuria, vaginal bleeding,  vaginal discharge and vaginal pain.      Allergies  Soap; Orange fruit; and Levaquin  Home Medications   Prior to Admission medications   Medication Sig Start Date End Date Taking? Authorizing Provider  EPINEPHrine 0.3 mg/0.3 mL IJ SOAJ injection Inject 0.3 mg into the muscle as needed (allergic reaction.). 08/10/13  Yes Sharmon Leyden, MD  lactose free nutrition (BOOST PLUS) LIQD Take 237 mLs by mouth 2 (two) times daily between meals. Patient not taking: Reported on 12/06/2014 09/09/14   Robbie Lis, MD  Loperamide HCl (IMODIUM A-D) 1 MG/7.5ML LIQD Take 15 mLs (2 mg total) by mouth every 6 (six) hours as needed (for diarrhea). Patient not taking: Reported on 04/30/2015 02/17/15   Barton Dubois, MD  Multiple Vitamins-Minerals (ONE DAILY PLUS IRON) TABS Take 1 tablet by mouth daily. Patient not taking: Reported on 04/30/2015 02/17/15   Barton Dubois, MD  ondansetron (ZOFRAN ODT) 8 MG disintegrating tablet Take 1 tablet (8 mg total) by mouth every 8 (eight) hours as needed for nausea or vomiting. Patient not taking: Reported on 04/30/2015 02/17/15   Barton Dubois, MD  potassium chloride (K-DUR) 10 MEQ tablet Take 3 tabs daily. Patient not taking: Reported on 04/30/2015 02/17/15   Barton Dubois, MD  promethazine (PHENERGAN) 12.5 MG tablet Take 1 tablet (12.5 mg total) by mouth every 6 (six) hours as needed for nausea or vomiting. Patient not taking: Reported on 01/14/2015 12/09/14   Theodis Blaze, MD   BP 104/66 mmHg  Pulse 101  Temp(Src) 97.6 F (36.4 C) (Oral)  Resp 16  SpO2 100%  LMP 04/14/2015 (Exact Date) Physical Exam  Constitutional: She is oriented to person, place, and time. She appears well-developed and well-nourished. No distress.  HENT:  Head: Normocephalic and atraumatic.  Dry mucus membranes  Eyes: Conjunctivae and EOM are normal. Pupils are equal, round, and reactive to light. Right eye exhibits no discharge. Left eye exhibits no discharge.  Neck: Normal range of motion. Neck  supple.  Cardiovascular: Regular rhythm and normal heart sounds.  Tachycardia present.  Exam reveals no friction rub.   No murmur heard. Pulmonary/Chest: Effort normal and breath sounds normal. No respiratory distress. She has no wheezes. She has no rales.  Abdominal: Soft. Bowel sounds are normal. She exhibits no distension. There is generalized tenderness. There is no rebound and no guarding.  Ileostomy bag identified to the right lower quadrant with watery appearing stools. Negative blood or mucus noted. Negative surrounding cellulitis or leaking noted from the back.  Musculoskeletal: Normal range of motion.  Neurological: She is alert and oriented to person, place, and time. No cranial nerve deficit. She exhibits normal muscle tone. Coordination normal. GCS eye subscore is 4. GCS verbal subscore is 5. GCS motor subscore is 6.  Skin: Skin  is warm and dry. No rash noted. She is not diaphoretic. No erythema.  Psychiatric: She has a normal mood and affect. Her behavior is normal. Thought content normal.  Nursing note and vitals reviewed.   ED Course  Procedures (including critical care time)  Results for orders placed or performed during the hospital encounter of 04/30/15  CBC with Differential/Platelet  Result Value Ref Range   WBC 12.9 (H) 4.0 - 10.5 K/uL   RBC 5.06 3.87 - 5.11 MIL/uL   Hemoglobin 14.7 12.0 - 15.0 g/dL   HCT 40.9 36.0 - 46.0 %   MCV 80.8 78.0 - 100.0 fL   MCH 29.1 26.0 - 34.0 pg   MCHC 35.9 30.0 - 36.0 g/dL   RDW 12.5 11.5 - 15.5 %   Platelets 495 (H) 150 - 400 K/uL   Neutrophils Relative % 76 43 - 77 %   Neutro Abs 9.7 (H) 1.7 - 7.7 K/uL   Lymphocytes Relative 17 12 - 46 %   Lymphs Abs 2.2 0.7 - 4.0 K/uL   Monocytes Relative 7 3 - 12 %   Monocytes Absolute 1.0 0.1 - 1.0 K/uL   Eosinophils Relative 0 0 - 5 %   Eosinophils Absolute 0.0 0.0 - 0.7 K/uL   Basophils Relative 0 0 - 1 %   Basophils Absolute 0.0 0.0 - 0.1 K/uL  Comprehensive metabolic panel  Result  Value Ref Range   Sodium 125 (L) 135 - 145 mmol/L   Potassium 2.7 (LL) 3.5 - 5.1 mmol/L   Chloride 91 (L) 101 - 111 mmol/L   CO2 16 (L) 22 - 32 mmol/L   Glucose, Bld 124 (H) 65 - 99 mg/dL   BUN 56 (H) 6 - 20 mg/dL   Creatinine, Ser 3.95 (H) 0.44 - 1.00 mg/dL   Calcium 9.8 8.9 - 10.3 mg/dL   Total Protein 10.2 (H) 6.5 - 8.1 g/dL   Albumin 5.2 (H) 3.5 - 5.0 g/dL   AST 22 15 - 41 U/L   ALT 17 14 - 54 U/L   Alkaline Phosphatase 109 38 - 126 U/L   Total Bilirubin 0.5 0.3 - 1.2 mg/dL   GFR calc non Af Amer 15 (L) >60 mL/min   GFR calc Af Amer 17 (L) >60 mL/min   Anion gap 18 (H) 5 - 15  Lipase, blood  Result Value Ref Range   Lipase 46 22 - 51 U/L  Urinalysis, Routine w reflex microscopic (not at [facility])  Result Value Ref Range   Color, Urine YELLOW YELLOW   APPearance CLOUDY (A) CLEAR   Specific Gravity, Urine 1.007 1.005 - 1.030   pH 5.0 5.0 - 8.0   Glucose, UA NEGATIVE NEGATIVE mg/dL   Hgb urine dipstick LARGE (A) NEGATIVE   Bilirubin Urine NEGATIVE NEGATIVE   Ketones, ur NEGATIVE NEGATIVE mg/dL   Protein, ur NEGATIVE NEGATIVE mg/dL   Urobilinogen, UA 0.2 0.0 - 1.0 mg/dL   Nitrite POSITIVE (A) NEGATIVE   Leukocytes, UA MODERATE (A) NEGATIVE  Pregnancy, urine  Result Value Ref Range   Preg Test, Ur NEGATIVE NEGATIVE  Urine microscopic-add on  Result Value Ref Range   Squamous Epithelial / LPF MANY (A) RARE   WBC, UA TOO NUMEROUS TO COUNT <3 WBC/hpf   Bacteria, UA MANY (A) RARE  I-Stat CG4 Lactic Acid, ED  Result Value Ref Range   Lactic Acid, Venous 1.95 0.5 - 2.0 mmol/L  I-Stat Beta hCG blood, ED ([facility], [facility], [facility] only)  Result Value Ref Range  I-stat hCG, quantitative <5.0 <5 mIU/mL   Comment 3          I-Stat CG4 Lactic Acid, ED  Result Value Ref Range   Lactic Acid, Venous 1.20 0.5 - 2.0 mmol/L    Labs Review Labs Reviewed  CBC WITH DIFFERENTIAL/PLATELET - Abnormal; Notable for the following:    WBC 12.9 (*)    Platelets 495 (*)    Neutro Abs 9.7 (*)    All other  components within normal limits  COMPREHENSIVE METABOLIC PANEL - Abnormal; Notable for the following:    Sodium 125 (*)    Potassium 2.7 (*)    Chloride 91 (*)    CO2 16 (*)    Glucose, Bld 124 (*)    BUN 56 (*)    Creatinine, Ser 3.95 (*)    Total Protein 10.2 (*)    Albumin 5.2 (*)    GFR calc non Af Amer 15 (*)    GFR calc Af Amer 17 (*)    Anion gap 18 (*)    All other components within normal limits  URINALYSIS, ROUTINE W REFLEX MICROSCOPIC (NOT AT [facility]) - Abnormal; Notable for the following:    APPearance CLOUDY (*)    Hgb urine dipstick LARGE (*)    Nitrite POSITIVE (*)    Leukocytes, UA MODERATE (*)    All other components within normal limits  URINE MICROSCOPIC-ADD ON - Abnormal; Notable for the following:    Squamous Epithelial / LPF MANY (*)    Bacteria, UA MANY (*)    All other components within normal limits  LIPASE, BLOOD  PREGNANCY, URINE  CBC  CREATININE, SERUM  MAGNESIUM  PHOSPHORUS  I-STAT CG4 LACTIC ACID, ED  I-STAT BETA HCG BLOOD, ED ([facility], [facility], [facility] ONLY)  I-STAT CG4 LACTIC ACID, ED    Imaging Review No results found.   EKG Interpretation None       7:33 AM Discussed case with Dr. Alvino Chapel, recommended patient be started on potassium IV for replenishing.  7:44 AM Discussed case in great detail with Dr. Rockne Menghini, [facility] hospitalist. Discussed case, labs, vitals, ED course in great detail. Patient to be admitted to stepdown under the care of [facility] hospitalist.  MDM   Final diagnoses:  Hyponatremia  Hypokalemia  Acute on chronic renal failure  Intractable vomiting with nausea, vomiting of unspecified type  Dehydration  Hypotension, unspecified hypotension type    Medications  loperamide (IMODIUM) 1 MG/5ML solution 3 mg (not administered)  sodium chloride 0.9 % injection 3 mL (not administered)  0.9 % NaCl with KCl 40 mEq / L  infusion (not administered)  acetaminophen (TYLENOL) tablet 650 mg (not administered)    Or  acetaminophen (TYLENOL)  suppository 650 mg (not administered)  ondansetron (ZOFRAN) tablet 4 mg (not administered)    Or  ondansetron (ZOFRAN) injection 4 mg (not administered)  enoxaparin (LOVENOX) injection 30 mg (not administered)  sodium chloride 0.9 % bolus 1,000 mL (0 mLs Intravenous Stopped 04/30/15 0717)  sodium chloride 0.9 % bolus 1,000 mL (0 mLs Intravenous Stopped 04/30/15 0717)  potassium chloride 10 mEq in 100 mL IVPB (0 mEq Intravenous Stopped 04/30/15 0820)  sodium chloride 0.9 % bolus 1,000 mL (0 mLs Intravenous Stopped 04/30/15 0935)    Filed Vitals:   04/30/15 0730 04/30/15 0745 04/30/15 0837 04/30/15 0921  BP: 81/37 76/41 88/47  104/66  Pulse: 90 89  101  Temp:      TempSrc:      Resp: 18 16 17  16  SpO2: 100% 100%  100%   EKG noted sinus tachycardia with a heart rate 104 bpm with no sign of T-wave changes. CBC noted elevated white blood cell count of 12.9. Hemoglobin 14.7, hematocrit 40.9. Platelets 495. CMP noted sodium of 125, potassium 2.7, chloride of 91 bicarbonate 16. BUN elevated at 56 and creatinine elevated at 3.95. BUN and creatinine are the highest that they have been when compared to previous labs. Elevated anion gap of 18. Lactic acid negative elevation. Lipase negative elevation. Upon arrival to the ED patient was afebrile, but was tachycardic with a heart rate 127 bpm and hypotensive with a blood pressure of 73/52. Patient immediately given 2 L of IV fluids, improved to 93/58. Shortly after, blood pressure dropped to 74/37. Patient presenting to the ED with hypotension, acute on chronic renal failure, dehydration. Patient to be admitted to stepdown under the care of [facility] hospitalist.  Jamse Mead, PA-C 04/30/15 0804  Jamse Mead, PA-C 04/30/15 [place], MD 04/30/15 [phone]

## 2015-04-30 NOTE — ED Provider Notes (Signed)
MSE was initiated and I personally evaluated the patient and placed orders (if any) at  5:59 AM on April 30, 2015.  The patient has a history of colon cancer (cancer free for 2 years) and has an ostomy bag. She reports multiple days of nausea, vomiting and abdominal pain. She also reports increased output of her ostomy bag. She comes to the ER concerned with being dehydrated.  Filed Vitals:   04/30/15 0516  BP: 73/52  Pulse: 127  Temp: 97.6 F (36.4 C)  Resp: 18   Fluid ordered Medications  sodium chloride 0.9 % bolus 1,000 mL (not administered)  sodium chloride 0.9 % bolus 1,000 mL (not administered)   Labs ordered. Labs Reviewed  CBC WITH DIFFERENTIAL/PLATELET  COMPREHENSIVE METABOLIC PANEL  LIPASE, BLOOD  URINALYSIS, ROUTINE W REFLEX MICROSCOPIC (NOT AT [facility])  PREGNANCY, URINE  I-STAT CG4 LACTIC ACID, ED    Dr. Dina Rich made aware of patients vital signs and screening labs placed. Oncoming Physician Assistant made aware of patient vitals signs and HPI as well.   Delos Haring, PA-C 04/30/15 [id number]  Merryl Hacker, MD 04/30/15 [phone]

## 2015-04-30 NOTE — ED Notes (Signed)
Pt states she thinks she is dehydrated  Has been vomiting since yesterday  Pt c/o abd pain Pt has ileostomy  Denies diarrhea

## 2015-04-30 NOTE — Consult Note (Signed)
WOC ostomy consult note Stoma type/location:  RLQ Ileostomy, existing.  Needs supplies Stomal assessment/size: 1 7/8"  Peristomal assessment: intact Treatment options for stomal/peristomal skin: none Output liquid light brown stool Ostomy pouching: 1pc. 2 inch convex pouch. Kellie Simmering # [phone] Education provided: Patient asleep and barely arouses for stoma assessment.  States her pouch does not need changing. Informed her I would order supplies.  Enrolled patient in [place] program: No Will not follow at this time.  Please re-consult if needed.  Domenic Moras RN BSN [place] Pager [phone]

## 2015-04-30 NOTE — H&P (Signed)
History and Physical:    Cindy [name]   [id number] DOB: 10-13-1991 DOA: 04/30/2015  Referring physician: Dr. Alvino Chapel PCP: Vidal Schwalbe, MD   Chief Complaint: Nausea and vomiting  History of Present Illness:   Cindy [name] is an 24 y.o. 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  Has had 4 episodes of vomiting, small volume, with food particles---non bilious, non-bloody in appearance.  The patient denies any changes to her ileostomy output.  Took some Zofran, which helped, but says she ran out of the medicine.  No specific aggravating factors.  Nausea and vomiting has been associated with crampy abdominal pain. Upon initial evaluation in the ED, the patient was noted to have a creatinine of 3.95, sodium of 125, and a potassium of 2.7.  ROS:   Constitutional: No fever, no chills;  Appetite normal; No weight loss, no weight gain, no fatigue.  HEENT: No blurry vision, no diplopia, no pharyngitis, no dysphagia CV: No chest pain, no palpitations, no PND, no orthopnea, no edema.  Resp: No SOB, no cough, no pleuritic pain. GI: + nausea, + vomiting, no diarrhea, no melena, no hematochezia, no constipation, + abdominal pain.  GU: No dysuria, no hematuria, no frequency, no urgency. MSK: no myalgias, no arthralgias.  Neuro:  No headache, no focal neurological deficits, no history of seizures.  Psych: No depression, no anxiety.  Endo: No heat intolerance, no cold intolerance, no polyuria, no polydipsia  Skin: No rashes, no skin lesions.  Heme: No easy bruising.  Travel history: No recent travel or camping.   Past Medical History:   Past Medical History  Diagnosis Date  . UTI (lower urinary tract infection)   . Heart murmur   .  Pneumonia 2008; 2012  . Complication of anesthesia     "I swallowed the anesthesia when appendix taken out; got pneumonia"  . Iron deficiency anemia   . History of blood transfusion 04/26/2013    "4 bags" (05/21/2013)  . Colon cancer 2014    s/p resection and ileostomy  . 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 FAP (familial adenomatous polyposis) 09/30/2014  . Nephrolithiasis 09/30/2014    Past Surgical History:   Past Surgical History  Procedure Laterality Date  . Leg reconstruction using fascial flap  ~ 2009  . Still born baby  03/22/13    delivered vaginally @ ~ 24 weeks  . Appendectomy  2008  . Colonoscopy N/A 05/24/2013    Procedure: COLONOSCOPY;  Surgeon: Missy Sabins, MD;  Location: [facility];  Service: Endoscopy;  Laterality: N/A;  . Colon surgery    . Ileostomy  06/26/2013  . I&d extremity Left 11/14/2013    Procedure: IRRIGATION AND DEBRIDEMENT LEFT LONG FINGER;  Surgeon: Tennis Must, MD;  Location: [facility];  Service: Orthopedics;  Laterality: Left;    Social History:   History   Social History  . Marital Status: Single    Spouse Name: N/A  . Number of Children: N/A  . Years of Education: N/A   Occupational History  . Unemployed    Social History Main Topics  . Smoking status: Never Smoker   . Smokeless tobacco: Never Used  . Alcohol Use: No  . Drug Use:  No  . Sexual Activity: Not Currently    Birth Control/ Protection: Condom     Comment: pregnancy   Other Topics Concern  . Not on file   Social History Narrative   Lives with her father.  Unemployed.  Previously worked at [employer].    Family history:   Family History  Problem Relation Age of Onset  . Adopted: Yes  . Alcohol abuse Neg Hx   . Arthritis Neg Hx   . Asthma Neg Hx   . Birth defects Neg Hx   . Cancer Neg Hx   . COPD Neg Hx   . Depression Neg Hx   . Diabetes Neg Hx   . Drug abuse Neg Hx   . Early death Neg Hx   . Hearing loss Neg Hx     . Heart disease Neg Hx   . Hyperlipidemia Neg Hx   . Hypertension Neg Hx   . Kidney disease Neg Hx   . Learning disabilities Neg Hx   . Mental illness Neg Hx   . Mental retardation Neg Hx   . Miscarriages / Stillbirths Neg Hx   . Stroke Neg Hx   . Vision loss Neg Hx   . ALS Mother     Allergies   Soap; Orange fruit; and Levaquin  Current Medications:   Prior to Admission medications   Medication Sig Start Date End Date Taking? Authorizing Provider  EPINEPHrine 0.3 mg/0.3 mL IJ SOAJ injection Inject 0.3 mg into the muscle as needed (allergic reaction.). 08/10/13  Yes Sharmon Leyden, MD  lactose free nutrition (BOOST PLUS) LIQD Take 237 mLs by mouth 2 (two) times daily between meals. Patient not taking: Reported on 12/06/2014 09/09/14   Robbie Lis, MD  Loperamide HCl (IMODIUM A-D) 1 MG/7.5ML LIQD Take 15 mLs (2 mg total) by mouth every 6 (six) hours as needed (for diarrhea). Patient not taking: Reported on 04/30/2015 02/17/15   Barton Dubois, MD  Multiple Vitamins-Minerals (ONE DAILY PLUS IRON) TABS Take 1 tablet by mouth daily. Patient not taking: Reported on 04/30/2015 02/17/15   Barton Dubois, MD  ondansetron (ZOFRAN ODT) 8 MG disintegrating tablet Take 1 tablet (8 mg total) by mouth every 8 (eight) hours as needed for nausea or vomiting. Patient not taking: Reported on 04/30/2015 02/17/15   Barton Dubois, MD  potassium chloride (K-DUR) 10 MEQ tablet Take 3 tabs daily. Patient not taking: Reported on 04/30/2015 02/17/15   Barton Dubois, MD  promethazine (PHENERGAN) 12.5 MG tablet Take 1 tablet (12.5 mg total) by mouth every 6 (six) hours as needed for nausea or vomiting. Patient not taking: Reported on 01/14/2015 12/09/14   Theodis Blaze, MD    Physical Exam:   Filed Vitals:   04/30/15 0730 04/30/15 0745 04/30/15 0837 04/30/15 0921  BP: 81/37 76/41 88/47  104/66  Pulse: 90 89  101  Temp:      TempSrc:      Resp: 18 16 17 16   SpO2: 100% 100%  100%     Physical Exam: Blood pressure  104/66, pulse 101, temperature 97.6 F (36.4 C), temperature source Oral, resp. rate 16, last menstrual period 04/14/2015, SpO2 100 %. Gen: No acute distress. Thin, pale-appearing. Head: Normocephalic, atraumatic. Eyes: PERRL, EOMI, sclerae nonicteric. Mouth: Oropharynx clear. Neck: Supple, no thyromegaly, no lymphadenopathy, no jugular venous distention. Chest: Lungs clear to auscultation bilaterally. CV: Heart sounds are regular. No murmurs, rubs, or gallops. Abdomen: Soft, nontender, nondistended with normal active bowel sounds. Pink ostomy right lower  quadrant. Extremities: Extremities are without clubbing, edema, or cyanosis. Skin: Warm and dry. Neuro: Alert and oriented times 3; grossly nonfocal. Psych: Mood and affect depressed.   Data Review:    Labs: Basic Metabolic Panel:  Recent Labs Lab 04/30/15 0604  NA 125*  K 2.7*  CL 91*  CO2 16*  GLUCOSE 124*  BUN 56*  CREATININE 3.95*  CALCIUM 9.8   Liver Function Tests:  Recent Labs Lab 04/30/15 0604  AST 22  ALT 17  ALKPHOS 109  BILITOT 0.5  PROT 10.2*  ALBUMIN 5.2*    Recent Labs Lab 04/30/15 0604  LIPASE 46   CBC:  Recent Labs Lab 04/30/15 0604  WBC 12.9*  NEUTROABS 9.7*  HGB 14.7  HCT 40.9  MCV 80.8  PLT 495*   BNP (last 3 results)  Recent Labs  05/13/14 1205  PROBNP 71.8    Radiographic Studies: No results found.   Assessment/Plan:   Principal Problem:   Acute renal failure secondary to high output ileostomy and nausea and vomiting/dehydration - We'll place on vigorous IV fluids with potassium replacement. - Interestingly, magnesium and phosphorus levels are both high raising the question of laxative of abuse. - We'll check a stool sodium, magnesium, potassium and osmolal gap.  Active Problems:   Hypokalemia - Replace.    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    Hyponatremia - Likely from hypovolemia.    DVT  prophylaxis - Lovenox ordered.  Code Status: Full. Family Communication: No family at the bedside. Disposition Plan: Home when renal failure improved and electrolytes stable, likely 1-3 days.  Time spent: One hour.  Tailyn Hantz [facility] Pager [phone] Cell: [phone]   If 7PM-7AM, please contact night-coverage [email or web address] Password [facility] 04/30/2015, 9:41 AM

## 2015-05-01 LAB — CBC
HEMATOCRIT: 28.1 % — AB (ref 36.0–46.0)
Hemoglobin: 9.5 g/dL — ABNORMAL LOW (ref 12.0–15.0)
MCH: 28.3 pg (ref 26.0–34.0)
MCHC: 33.8 g/dL (ref 30.0–36.0)
MCV: 83.6 fL (ref 78.0–100.0)
PLATELETS: 295 10*3/uL (ref 150–400)
RBC: 3.36 MIL/uL — ABNORMAL LOW (ref 3.87–5.11)
RDW: 12.8 % (ref 11.5–15.5)
WBC: 11.5 10*3/uL — ABNORMAL HIGH (ref 4.0–10.5)

## 2015-05-01 LAB — BASIC METABOLIC PANEL
ANION GAP: 6 (ref 5–15)
BUN: 29 mg/dL — ABNORMAL HIGH (ref 6–20)
CALCIUM: 8.5 mg/dL — AB (ref 8.9–10.3)
CO2: 14 mmol/L — AB (ref 22–32)
CREATININE: 2.15 mg/dL — AB (ref 0.44–1.00)
Chloride: 119 mmol/L — ABNORMAL HIGH (ref 101–111)
GFR calc Af Amer: 35 mL/min — ABNORMAL LOW (ref >60)
GFR calc non Af Amer: 31 mL/min — ABNORMAL LOW (ref >60)
Glucose, Bld: 124 mg/dL — ABNORMAL HIGH (ref 65–99)
POTASSIUM: 4.3 mmol/L (ref 3.5–5.1)
SODIUM: 139 mmol/L (ref 135–145)

## 2015-05-01 LAB — PHOSPHORUS: PHOSPHORUS: 2.7 mg/dL (ref 2.5–4.6)

## 2015-05-01 LAB — RAPID URINE DRUG SCREEN, HOSP PERFORMED
Amphetamines: NOT DETECTED
Barbiturates: NOT DETECTED
Benzodiazepines: NOT DETECTED
COCAINE: NOT DETECTED
Opiates: POSITIVE — AB
TETRAHYDROCANNABINOL: NOT DETECTED

## 2015-05-01 LAB — OSMOLALITY: OSMOLALITY: 284 mosm/kg (ref 275–300)

## 2015-05-01 LAB — OSMOLALITY, STOOL: OSMOLALITY STL: 340 mosm/kg

## 2015-05-01 LAB — MAGNESIUM: MAGNESIUM: 1.7 mg/dL (ref 1.7–2.4)

## 2015-05-01 MED ORDER — CEFTRIAXONE SODIUM IN DEXTROSE 20 MG/ML IV SOLN
1.0000 g | INTRAVENOUS | Status: DC
  Administered 2015-05-01 – 2015-05-03 (×3): 1 g via INTRAVENOUS
  Filled 2015-05-01 (×3): qty 50

## 2015-05-01 MED ORDER — SODIUM BICARBONATE 650 MG PO TABS
1300.0000 mg | ORAL_TABLET | Freq: Three times a day (TID) | ORAL | Status: DC
  Administered 2015-05-01 (×3): 1300 mg via ORAL
  Filled 2015-05-01 (×6): qty 2

## 2015-05-01 NOTE — Progress Notes (Signed)
Progress Note   Cindy [name] [id number] DOB: Oct 14, 1991 DOA: 04/30/2015 PCP: Vidal Schwalbe, MD   Brief Narrative:   Cindy [name] is an 24 y.o. female with a PMH of colon cancer diagnosed 04/2013 status post subtotal colectomy/en bloc resection terminal ileum, background FAP complicated by severe malnutrition and high ostomy output with recurrent hospitalizations for dehydration, who was admitted 04/30/15 with a 24 hour history of nausea and vomiting. Upon initial evaluation in the ED, the patient was noted to have a creatinine of 3.95, sodium of 125, and a potassium of 2.7. Magnesium and phosphorus were both elevated.  Assessment/Plan:   Principal Problem:  Acute renal failure secondary to high output ileostomy and nausea and vomiting/dehydration - Creatinine improving with vigorous IV fluids. - F/U stool sodium, magnesium, potassium and osmolal gap. - Baseline creatinine 1.2.  Active Problems:   UTI - Given ongoing abdominal pain, will start empiric Rocephin for UTI. - F/U urine culture.    Metabolic acidosis - Likely from bicarbonate losses in the stool. We'll place on sodium bicarbonate tablets.    Hypermagnesemia/hyperphosphatemia - Normalized with IV fluids. - Follow-up stool sodium, magnesium, potassium and osmolal gap. - Denies laxative abuse.  Took a few doses of Herbal Life.  Drinks Gatorade. - Serum osmolality WNL at 284.   Hypokalemia - Potassium WNL after supplementation.   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 - Avoid IV opiates.   Hyponatremia - Likely from hypovolemia. Resolved with correction of dehydration.   DVT prophylaxis - Lovenox ordered.  Code Status: Full. Family Communication: Father updated at the bedside. Disposition Plan: Home when renal failure improved and electrolytes stable, likely 1-3 days.   IV Access:    Peripheral IV   Procedures and diagnostic studies:    No results found.   Medical Consultants:    None.  Anti-Infectives:    Rocephin 05/01/15--->  Subjective:   Cindy [name] reports ongoing severe crampy abdominal pain, and nausea.  Vomited once today.  Denies laxative abuse or supplement use other than taking a few doses of Herbal Life (has a small amount of magnesium).  Drinks 3 large containers of Gatorade daily (might explain the high phosphorus).  No fever/chills.  Objective:    Filed Vitals:   04/30/15 1500 04/30/15 1900 04/30/15 2150 05/01/15 0631  BP: 89/58 106/61  97/58  Pulse: 99 115 108 67  Temp: 97.8 F (36.6 C) 97.8 F (36.6 C)  97.7 F (36.5 C)  TempSrc: Oral Oral  Oral  Resp: 17 20  16   Height:      Weight:      SpO2: 100% 100%  100%    Intake/Output Summary (Last 24 hours) at 05/01/15 0726 Last data filed at 05/01/15 [id number]  Gross per 24 hour  Intake 3298.17 ml  Output   1250 ml  Net 2048.17 ml    Exam: Gen:  NAD Cardiovascular:  RRR, No M/R/G Respiratory:  Lungs CTAB Gastrointestinal:  Abdomen soft, tender, no rebound, + BS, brown liquid stools in ostomy Extremities:  No C/E/C   Data Reviewed:    Labs: Basic Metabolic Panel:  Recent Labs Lab 04/30/15 0604 05/01/15 0422  NA 125* 139  K 2.7* 4.3  CL 91* 119*  CO2 16* 14*  GLUCOSE 124* 124*  BUN 56* 29*  CREATININE 3.95* 2.15*  CALCIUM 9.8 8.5*  MG 2.8* 1.7  PHOS 7.6* 2.7   GFR Estimated Creatinine Clearance: 29.8 mL/min (by C-G  formula based on Cr of 2.15). Liver Function Tests:  Recent Labs Lab 04/30/15 0604  AST 22  ALT 17  ALKPHOS 109  BILITOT 0.5  PROT 10.2*  ALBUMIN 5.2*    Recent Labs Lab 04/30/15 0604  LIPASE 46   CBC:  Recent Labs Lab 04/30/15 0604 05/01/15 0422  WBC 12.9* 11.5*  NEUTROABS 9.7*  --   HGB 14.7 9.5*  HCT 40.9 28.1*  MCV 80.8 83.6  PLT 495* 295   BNP (last 3 results)  Recent Labs  05/13/14 1205  PROBNP 71.8   Sepsis Labs:  Recent Labs Lab 04/30/15 0604  04/30/15 0704 04/30/15 0934 05/01/15 0422  WBC 12.9*  --   --  11.5*  LATICACIDVEN  --  1.95 1.20  --    Microbiology No results found for this or any previous visit (from the past 240 hour(s)).   Medications:   . enoxaparin (LOVENOX) injection  30 mg Subcutaneous Q24H  . feeding supplement (RESOURCE BREEZE)  1 Container Oral BID BM  . sodium chloride  3 mL Intravenous Q12H   Continuous Infusions: . 0.9 % NaCl with KCl 40 mEq / L 175 mL/hr (05/01/15 0444)    Time spent: 35 minutes with > 50% of time discussing current diagnostic test results, clinical impression and plan of care with the patient and her father.   LOS: 1 day   Ezelle Surprenant  [facility] Pager [phone]. If unable to reach me by pager, please call my cell phone at [phone].  *Please refer to [email or web address], password [email or web address] to get updated schedule on who will round on this patient, as hospitalists switch teams weekly. If 7PM-7AM, please contact night-coverage at [email or web address], password [email or web address] for any overnight needs.  05/01/2015, 7:26 AM

## 2015-05-01 NOTE — Progress Notes (Signed)
Patient in bed rolling around from side to side and screaming/hollaring. Patient reports pain in left side 10/10 and "sharp". Paged NP and order received for morphine 2 mg IV x1. Last SBP > 100.

## 2015-05-01 NOTE — Plan of Care (Signed)
Problem: Phase I Progression Outcomes Goal: Voiding-avoid urinary catheter unless indicated Outcome: Progressing At 2020 patient reports been oob trying to void but could not. Obtained bladder scanner and it showed only 143 ml in bladder at 2143. Then patient oob to [facility] to void approximately 150 ml. Will continue to monitor.

## 2015-05-01 NOTE — Progress Notes (Signed)
Obtained order from NP to add Urine culture to ua done 6/3.

## 2015-05-01 NOTE — Plan of Care (Signed)
Problem: Phase I Progression Outcomes Goal: Hemodynamically stable Outcome: Progressing SBP improving with IVF's

## 2015-05-01 NOTE — Progress Notes (Addendum)
Patient had went to sleep after morphine earlier. Now c/o pain again in left side 9/10 after getting oob to [facility] to void. Was able to void 150 ml of urine. Paged NP on call after patient refused levsin stating she can not take it because "it always makes the pain worse" (even though she had some on day shift).. PRN order for morphine obtained from NP.

## 2015-05-02 DIAGNOSIS — D649 Anemia, unspecified: Secondary | ICD-10-CM | POA: Diagnosis present

## 2015-05-02 DIAGNOSIS — R112 Nausea with vomiting, unspecified: Secondary | ICD-10-CM

## 2015-05-02 LAB — BASIC METABOLIC PANEL
Anion gap: 5 (ref 5–15)
BUN: 17 mg/dL (ref 6–20)
CHLORIDE: 124 mmol/L — AB (ref 101–111)
CO2: 15 mmol/L — ABNORMAL LOW (ref 22–32)
Calcium: 8.3 mg/dL — ABNORMAL LOW (ref 8.9–10.3)
Creatinine, Ser: 1.82 mg/dL — ABNORMAL HIGH (ref 0.44–1.00)
GFR calc Af Amer: 43 mL/min — ABNORMAL LOW (ref >60)
GFR calc non Af Amer: 37 mL/min — ABNORMAL LOW (ref >60)
GLUCOSE: 96 mg/dL (ref 65–99)
Potassium: 4.6 mmol/L (ref 3.5–5.1)
Sodium: 144 mmol/L (ref 135–145)

## 2015-05-02 MED ORDER — ENOXAPARIN SODIUM 40 MG/0.4ML ~~LOC~~ SOLN
40.0000 mg | SUBCUTANEOUS | Status: DC
  Administered 2015-05-02: 40 mg via SUBCUTANEOUS
  Filled 2015-05-02 (×2): qty 0.4

## 2015-05-02 MED ORDER — BOOST / RESOURCE BREEZE PO LIQD
1.0000 | Freq: Three times a day (TID) | ORAL | Status: DC
  Administered 2015-05-02: 1 via ORAL

## 2015-05-02 MED ORDER — SODIUM BICARBONATE 8.4 % IV SOLN
INTRAVENOUS | Status: DC
  Administered 2015-05-02 – 2015-05-03 (×3): via INTRAVENOUS
  Filled 2015-05-02 (×5): qty 1000

## 2015-05-02 NOTE — Progress Notes (Signed)
Progress Note   MAHOGANIE INNESS [id number] DOB: 03/27/1991 DOA: 04/30/2015 PCP: Vidal Schwalbe, MD   Brief Narrative:   Cindy [name] is an 24 y.o. female with a PMH of colon cancer diagnosed 04/2013 status post subtotal colectomy/en bloc resection terminal ileum, background FAP complicated by severe malnutrition and high ostomy output with recurrent hospitalizations for dehydration, who was admitted 04/30/15 with a 24 hour history of nausea and vomiting. Upon initial evaluation in the ED, the patient was noted to have a creatinine of 3.95, sodium of 125, and a potassium of 2.7. Magnesium and phosphorus were both elevated.  Assessment/Plan:   Principal Problem:  Acute renal failure secondary to high output ileostomy and nausea and vomiting/dehydration - Creatinine improving with vigorous IV fluids. - Stool osmolality 340, serum osmolality 284, stool sodium, potassium and magnesium still pending. - F/U stool sodium, magnesium, potassium and osmolal gap. - Baseline creatinine 1.2.  Active Problems:   Normocytic anemia - Significant drop in hemoglobin noted, likely reflective of hemoconcentration on admission with dilutional changes. - Baseline hemoglobin 8.5. Current hemoglobin 9.5.    UTI - Given ongoing abdominal pain, will start empiric Rocephin for UTI. - F/U urine culture. Preliminary report shows greater than 100,000 colonies of GNR.    Metabolic acidosis - Likely from bicarbonate losses in the stool. We'll start sodium bicarbonate in IV fluids.    Hypermagnesemia/hyperphosphatemia - Normalized with IV fluids. - Follow-up stool sodium, magnesium, potassium and osmolal gap. - Denies laxative abuse.  Took a few doses of Herbal Life.  Drinks Gatorade. - Serum osmolality WNL at 284, but stool osmolality 340.   Hypokalemia - Potassium WNL after supplementation. Discontinue supplementation.   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 - Avoid IV opiates.   Hyponatremia - Likely from hypovolemia. Resolved with correction of dehydration.   DVT prophylaxis - Lovenox ordered.  Code Status: Full. Family Communication: Father updated at the bedside 05/01/15. Disposition Plan: Home when renal failure improved and electrolytes stable, likely 1-3 days.   IV Access:    Peripheral IV   Procedures and diagnostic studies:   No results found.   Medical Consultants:    None.  Anti-Infectives:    Rocephin 05/01/15--->  Subjective:   Cindy [name] feels a bit better today.  Hasn't tried to eat yet, but no vomiting.  Crampy pain improved.  Objective:    Filed Vitals:   05/01/15 0631 05/01/15 1403 05/01/15 2107 05/02/15 0539  BP: 97/58 107/60 106/52 94/49  Pulse: 67 108 103 70  Temp: 97.7 F (36.5 C) 98.4 F (36.9 C) 98 F (36.7 C) 97.8 F (36.6 C)  TempSrc: Oral Oral Oral Oral  Resp: 16 16 16 16   Height:      Weight:      SpO2: 100% 100% 100% 94%    Intake/Output Summary (Last 24 hours) at 05/02/15 0745 Last data filed at 05/02/15 0539  Gross per 24 hour  Intake      0 ml  Output   1885 ml  Net  -1885 ml    Exam: Gen:  NAD Cardiovascular:  RRR, No M/R/G Respiratory:  Lungs CTAB Gastrointestinal:  Abdomen soft, tender, no rebound, + BS, brown liquid stools in ostomy Extremities:  No C/E/C   Data Reviewed:    Labs: Basic Metabolic Panel:  Recent Labs Lab 04/30/15 0604 05/01/15 0422 05/02/15 0506  NA 125* 139 144  K 2.7* 4.3 4.6  CL 91*  119* 124*  CO2 16* 14* 15*  GLUCOSE 124* 124* 96  BUN 56* 29* 17  CREATININE 3.95* 2.15* 1.82*  CALCIUM 9.8 8.5* 8.3*  MG 2.8* 1.7  --   PHOS 7.6* 2.7  --    GFR Estimated Creatinine Clearance: 35.2 mL/min (by C-G formula based on Cr of 1.82). Liver Function Tests:  Recent Labs Lab 04/30/15 0604  AST 22  ALT 17  ALKPHOS 109  BILITOT 0.5  PROT 10.2*  ALBUMIN 5.2*    Recent Labs Lab 04/30/15 0604    LIPASE 46   CBC:  Recent Labs Lab 04/30/15 0604 05/01/15 0422  WBC 12.9* 11.5*  NEUTROABS 9.7*  --   HGB 14.7 9.5*  HCT 40.9 28.1*  MCV 80.8 83.6  PLT 495* 295   BNP (last 3 results)  Recent Labs  05/13/14 1205  PROBNP 71.8   Sepsis Labs:  Recent Labs Lab 04/30/15 0604 04/30/15 0704 04/30/15 0934 05/01/15 0422  WBC 12.9*  --   --  11.5*  LATICACIDVEN  --  1.95 1.20  --    Microbiology Recent Results (from the past 240 hour(s))  Urine culture     Status: None (Preliminary result)   Collection Time: 04/30/15  8:11 AM  Result Value Ref Range Status   Specimen Description URINE, RANDOM  Final   Special Requests NONE  Final   Colony Count   Final    >=100,000 COLONIES/ML Performed at [facility]    Culture   Final    [place] Performed at [facility]    Report Status PENDING  Incomplete     Medications:   . cefTRIAXone (ROCEPHIN)  IV  1 g Intravenous Q24H  . enoxaparin (LOVENOX) injection  30 mg Subcutaneous Q24H  . feeding supplement (RESOURCE BREEZE)  1 Container Oral BID BM  . sodium bicarbonate  1,300 mg Oral TID  . sodium chloride  3 mL Intravenous Q12H   Continuous Infusions: . 0.9 % NaCl with KCl 40 mEq / L 100 mL/hr (05/01/15 2332)    Time spent: 25 minutes.   LOS: 2 days   Meryem Haertel  [facility] Pager [phone]. If unable to reach me by pager, please call my cell phone at [phone].  *Please refer to [email or web address], password [email or web address] to get updated schedule on who will round on this patient, as hospitalists switch teams weekly. If 7PM-7AM, please contact night-coverage at [email or web address], password [email or web address] for any overnight needs.  05/02/2015, 7:45 AM

## 2015-05-02 NOTE — Progress Notes (Signed)
Nutrition Follow-up  DOCUMENTATION CODES:  Not applicable  INTERVENTION:  Boost Breeze TID Encourage PO intake  RD to continue to monitor  NUTRITION DIAGNOSIS:  Inadequate oral intake related to nausea, vomiting as evidenced by estimated needs (need for CLD).  Resolved.  New Nutrition Diagnosis:  Increased nutrient needs related to colon cancer as evidenced by estimated nutritional needs.  GOAL:  Patient will meet greater than or equal to 90% of their needs  Progressing.  MONITOR:  PO intake, Diet advancement, Supplement acceptance, Weight trends, Labs, I & O's  REASON FOR ASSESSMENT:  Consult Assessment of nutrition requirement/status  ASSESSMENT: Per H&P, 24 y.o. 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 Has had 4 episodes of vomiting, small volume, with food particles---non bilious, non-bloody in appearance. The patient denies any changes to her ileostomy output.Nausea and vomiting has been associated with crampy abdominal pain.   6/3: Pt seen for consult. BMI indicates normal weight status. Pt on CLD since admission with no intakes documented. Will order Boost Breeze BID to supplement given vomiting PTA and need for repletion of nutrients and fluid.   6/5: -Pt in room with uneaten lunch tray. Per tech and pt, pt did not like the food on her tray. Pt's father is to bring pt something else to eat. -PO intake: nothing yet as pt did not eat breakfast this morning -Pt reports not being able to tolerate anything solid since 6/2 d/t N/V. Pt tolerated Gatorade and juices only. -Pt reports she is hungry. Pt is drinking Resource Breeze supplements. Will increase to TID. -Nutrition focused physical exam shows no sign of  depletion of muscle mass or body fat.  Labs reviewed: Elevated Creatinine Mg/Phos WNL  Height:  Ht Readings from Last 1 Encounters:  04/30/15 5\' 1"  (1.549 m)    Weight:  Wt Readings from Last 1 Encounters:  04/30/15 105 lb (47.628 kg)    Ideal Body Weight:  47.7 kg (kg)  Wt Readings from Last 10 Encounters:  04/30/15 105 lb (47.628 kg)  02/16/15 100 lb (45.36 kg)  01/16/15 104 lb 4.4 oz (47.3 kg)  12/09/14 110 lb 14.3 oz (50.3 kg)  10/02/14 113 lb 15.7 oz (51.7 kg)  09/08/14 105 lb 8 oz (47.854 kg)  07/11/14 105 lb 6.4 oz (47.809 kg)  02/16/14 105 lb 6 oz (47.798 kg)  01/28/14 107 lb (48.535 kg)  01/19/14 107 lb (48.535 kg)    BMI:  Body mass index is 19.85 kg/(m^2).  Estimated Nutritional Needs:  Kcal:  1450-1650  Protein:  60-75 grams  Fluid:  2-2.5 L/day     Skin:  Reviewed, no issues  Diet Order:  Diet regular Room service appropriate?: Yes; Fluid consistency:: Thin  EDUCATION NEEDS:  No education needs identified at this time   Intake/Output Summary (Last 24 hours) at 05/02/15 1325 Last data filed at 05/02/15 0539  Gross per 24 hour  Intake      0 ml  Output   1625 ml  Net  -1625 ml    Last BM:  6/5  Ileostomy output: 275 ml  Clayton Bibles, MS, RD, LDN Pager: [phone] After Hours Pager: [phone]

## 2015-05-03 DIAGNOSIS — E872 Acidosis: Secondary | ICD-10-CM

## 2015-05-03 LAB — BASIC METABOLIC PANEL WITH GFR
Anion gap: 8 (ref 5–15)
BUN: 10 mg/dL (ref 6–20)
CO2: 23 mmol/L (ref 22–32)
Calcium: 7.3 mg/dL — ABNORMAL LOW (ref 8.9–10.3)
Chloride: 108 mmol/L (ref 101–111)
Creatinine, Ser: 1.26 mg/dL — ABNORMAL HIGH (ref 0.44–1.00)
GFR calc Af Amer: >60 mL/min
GFR calc non Af Amer: 58 mL/min — ABNORMAL LOW
Glucose, Bld: 120 mg/dL — ABNORMAL HIGH (ref 65–99)
Potassium: 2.7 mmol/L — CL (ref 3.5–5.1)
Sodium: 139 mmol/L (ref 135–145)

## 2015-05-03 LAB — URINE CULTURE: Colony Count: >=100000

## 2015-05-03 LAB — BASIC METABOLIC PANEL
ANION GAP: 6 (ref 5–15)
Anion gap: 8 (ref 5–15)
BUN: 8 mg/dL (ref 6–20)
BUN: 8 mg/dL (ref 6–20)
CHLORIDE: 106 mmol/L (ref 101–111)
CO2: 28 mmol/L (ref 22–32)
CO2: 26 mmol/L (ref 22–32)
CREATININE: 1.09 mg/dL — AB (ref 0.44–1.00)
Calcium: 7.4 mg/dL — ABNORMAL LOW (ref 8.9–10.3)
Calcium: 7.9 mg/dL — ABNORMAL LOW (ref 8.9–10.3)
Chloride: 106 mmol/L (ref 101–111)
Creatinine, Ser: 1.15 mg/dL — ABNORMAL HIGH (ref 0.44–1.00)
GFR calc Af Amer: >60 mL/min (ref >60)
GFR calc non Af Amer: >60 mL/min (ref >60)
Glucose, Bld: 113 mg/dL — ABNORMAL HIGH (ref 65–99)
Glucose, Bld: 99 mg/dL (ref 65–99)
POTASSIUM: 3.2 mmol/L — AB (ref 3.5–5.1)
Potassium: 2.9 mmol/L — ABNORMAL LOW (ref 3.5–5.1)
SODIUM: 140 mmol/L (ref 135–145)
Sodium: 140 mmol/L (ref 135–145)

## 2015-05-03 LAB — CBC
HCT: 25.3 % — ABNORMAL LOW (ref 36.0–46.0)
Hemoglobin: 8.5 g/dL — ABNORMAL LOW (ref 12.0–15.0)
MCH: 28.5 pg (ref 26.0–34.0)
MCHC: 33.6 g/dL (ref 30.0–36.0)
MCV: 84.9 fL (ref 78.0–100.0)
Platelets: 212 10*3/uL (ref 150–400)
RBC: 2.98 MIL/uL — ABNORMAL LOW (ref 3.87–5.11)
RDW: 13.3 % (ref 11.5–15.5)
WBC: 6.6 10*3/uL (ref 4.0–10.5)

## 2015-05-03 LAB — MAGNESIUM
MAGNESIUM: 2 mg/dL (ref 1.7–2.4)
Magnesium: 1.1 mg/dL — ABNORMAL LOW (ref 1.7–2.4)

## 2015-05-03 MED ORDER — POTASSIUM CHLORIDE CRYS ER 20 MEQ PO TBCR
40.0000 meq | EXTENDED_RELEASE_TABLET | Freq: Once | ORAL | Status: AC
  Administered 2015-05-03: 40 meq via ORAL
  Filled 2015-05-03: qty 2

## 2015-05-03 MED ORDER — POTASSIUM CHLORIDE CRYS ER 20 MEQ PO TBCR
40.0000 meq | EXTENDED_RELEASE_TABLET | Freq: Three times a day (TID) | ORAL | Status: DC
  Administered 2015-05-03 (×2): 40 meq via ORAL
  Filled 2015-05-03 (×2): qty 2

## 2015-05-03 MED ORDER — POTASSIUM CHLORIDE CRYS ER 20 MEQ PO TBCR: 40.0000 meq | EXTENDED_RELEASE_TABLET | Freq: Two times a day (BID) | ORAL | Status: DC

## 2015-05-03 MED ORDER — BOOST / RESOURCE BREEZE PO LIQD: 1.0000 | Freq: Three times a day (TID) | ORAL | Status: DC

## 2015-05-03 MED ORDER — POTASSIUM CHLORIDE 10 MEQ/100ML IV SOLN
10.0000 meq | INTRAVENOUS | Status: DC
  Administered 2015-05-03: 10 meq via INTRAVENOUS
  Filled 2015-05-03 (×4): qty 100

## 2015-05-03 MED ORDER — POTASSIUM CHLORIDE IN NACL 40-0.9 MEQ/L-% IV SOLN
INTRAVENOUS | Status: DC
  Administered 2015-05-03: 100 mL/h via INTRAVENOUS
  Filled 2015-05-03 (×3): qty 1000

## 2015-05-03 MED ORDER — MAGNESIUM SULFATE 2 GM/50ML IV SOLN
2.0000 g | Freq: Once | INTRAVENOUS | Status: AC
  Administered 2015-05-03: 2 g via INTRAVENOUS
  Filled 2015-05-03: qty 50

## 2015-05-03 MED ORDER — AMPICILLIN 250 MG PO CAPS: 250.0000 mg | ORAL_CAPSULE | Freq: Three times a day (TID) | ORAL | Status: DC

## 2015-05-03 MED ORDER — POTASSIUM CHLORIDE 10 MEQ/100ML IV SOLN
10.0000 meq | INTRAVENOUS | Status: AC
  Administered 2015-05-03 (×2): 10 meq via INTRAVENOUS
  Filled 2015-05-03 (×7): qty 100

## 2015-05-03 MED ORDER — SODIUM CHLORIDE 0.9 % IV SOLN: INTRAVENOUS | Status: DC

## 2015-05-03 NOTE — Progress Notes (Signed)
Progress Note   Cindy [name] [id number] DOB: September 08, 1991 DOA: 04/30/2015 PCP: Vidal Schwalbe, MD   Brief Narrative:   Cindy [name] is an 24 y.o. female with a PMH of colon cancer diagnosed 04/2013 status post subtotal colectomy/en bloc resection terminal ileum, background FAP complicated by severe malnutrition and high ostomy output with recurrent hospitalizations for dehydration, who was admitted 04/30/15 with a 24 hour history of nausea and vomiting. Upon initial evaluation in the ED, the patient was noted to have a creatinine of 3.95, sodium of 125, and a potassium of 2.7. Magnesium and phosphorus were both elevated.  Assessment/Plan:   Principal Problem:  Acute renal failure secondary to high output ileostomy and nausea and vomiting/dehydration - Creatinine improving with vigorous IV fluids. - Stool osmolality 340, serum osmolality 284, stool sodium, potassium and magnesium still pending. - F/U stool sodium, magnesium, potassium and osmolal gap. - Baseline creatinine 1.2. Creatinine now back to usual baseline values.  Active Problems:   Normocytic anemia - Significant drop in hemoglobin noted, likely reflective of hemoconcentration on admission with dilutional changes. - Baseline hemoglobin 8.5. Current hemoglobin 8.5.    UTI - Given ongoing abdominal pain, will start empiric Rocephin for UTI. - F/U urine culture. Preliminary report shows greater than 100,000 colonies of GNR.    Metabolic acidosis - Likely from bicarbonate losses in the stool. Resolved with sodium bicarbonate in IV fluids. Discontinue.    Hypermagnesemia/hyperphosphatemia/hypomagnesemia/hypokalemia - Normalized with IV fluids. - Follow-up stool sodium, magnesium, potassium and osmolal gap. - Denies laxative abuse.  Took a few doses of Herbal Life.  Drinks Gatorade. - Serum osmolality WNL at 284, but stool osmolality 340. - Magnesium now low, will give 2 g magnesium sulfate IV.   Hypokalemia -  Potassium again low today, resume supplementation.   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 - Avoid IV opiates.   Hyponatremia - Likely from hypovolemia. Resolved with correction of dehydration.   DVT prophylaxis - Lovenox ordered.  Code Status: Full. Family Communication: Father updated at the bedside 05/01/15. Disposition Plan: Home when renal failure improved and electrolytes stable, likely tomorrow.   IV Access:    Peripheral IV   Procedures and diagnostic studies:   No results found.   Medical Consultants:    None.  Anti-Infectives:    Rocephin 05/01/15--->  Subjective:   Cindy [name] feels better.  No vomiting, abdominal pain improved.  Wants to go home.  Objective:    Filed Vitals:   05/02/15 0539 05/02/15 1422 05/02/15 2025 05/03/15 0535  BP: 94/49 97/57 99/55  91/47  Pulse: 70 100 111 100  Temp: 97.8 F (36.6 C) 98.4 F (36.9 C) 98.2 F (36.8 C) 98.3 F (36.8 C)  TempSrc: Oral Oral Oral Oral  Resp: 16 16 16 16   Height:      Weight:      SpO2: 94% 99% 100% 100%    Intake/Output Summary (Last 24 hours) at 05/03/15 0739 Last data filed at 05/03/15 0536  Gross per 24 hour  Intake      0 ml  Output   2050 ml  Net  -2050 ml    Exam: Gen:  NAD Cardiovascular:  RRR, No M/R/G Respiratory:  Lungs CTAB Gastrointestinal:  Abdomen soft, NT/ND, +BS Extremities:  No C/E/C   Data Reviewed:    Labs: Basic Metabolic Panel:  Recent Labs Lab 04/30/15 0604 05/01/15 0422 05/02/15 0506 05/03/15 0355  NA 125* 139 144 139  K 2.7* 4.3 4.6 2.7*  CL 91* 119* 124* 108  CO2 16* 14* 15* 23  GLUCOSE 124* 124* 96 120*  BUN 56* 29* 17 10  CREATININE 3.95* 2.15* 1.82* 1.26*  CALCIUM 9.8 8.5* 8.3* 7.3*  MG 2.8* 1.7  --  1.1*  PHOS 7.6* 2.7  --   --    GFR Estimated Creatinine Clearance: 50.8 mL/min (by C-G formula based on Cr of 1.26). Liver Function Tests:  Recent Labs Lab  04/30/15 0604  AST 22  ALT 17  ALKPHOS 109  BILITOT 0.5  PROT 10.2*  ALBUMIN 5.2*    Recent Labs Lab 04/30/15 0604  LIPASE 46   CBC:  Recent Labs Lab 04/30/15 0604 05/01/15 0422 05/03/15 0355  WBC 12.9* 11.5* 6.6  NEUTROABS 9.7*  --   --   HGB 14.7 9.5* 8.5*  HCT 40.9 28.1* 25.3*  MCV 80.8 83.6 84.9  PLT 495* 295 212   BNP (last 3 results)  Recent Labs  05/13/14 1205  PROBNP 71.8   Sepsis Labs:  Recent Labs Lab 04/30/15 0604 04/30/15 0704 04/30/15 0934 05/01/15 0422 05/03/15 0355  WBC 12.9*  --   --  11.5* 6.6  LATICACIDVEN  --  1.95 1.20  --   --    Microbiology Recent Results (from the past 240 hour(s))  Urine culture     Status: None (Preliminary result)   Collection Time: 04/30/15  8:11 AM  Result Value Ref Range Status   Specimen Description URINE, RANDOM  Final   Special Requests NONE  Final   Colony Count   Final    >=100,000 COLONIES/ML Performed at [facility]    Culture   Final    [place] Performed at [facility]    Report Status PENDING  Incomplete     Medications:   . cefTRIAXone (ROCEPHIN)  IV  1 g Intravenous Q24H  . enoxaparin (LOVENOX) injection  40 mg Subcutaneous Q24H  . feeding supplement (RESOURCE BREEZE)  1 Container Oral TID BM  . potassium chloride  40 mEq Oral BID  . sodium chloride  3 mL Intravenous Q12H   Continuous Infusions: . dextrose 5 % 1,000 mL with sodium bicarbonate 150 mEq infusion 100 mL/hr at 05/03/15 0655    Time spent: 25 minutes.   LOS: 3 days   [facility] Pager [phone]. If unable to reach me by pager, please call my cell phone at [phone].  *Please refer to [email or web address], password [email or web address] to get updated schedule on who will round on this patient, as hospitalists switch teams weekly. If 7PM-7AM, please contact night-coverage at [email or web address], password [email or web address] for any overnight needs.  05/03/2015, 7:39 AM

## 2015-05-03 NOTE — Progress Notes (Signed)
Patient given 40MEQ PO potassium and discharge instructions reviewed.  Ambulated off department with friends at side.

## 2015-05-03 NOTE — Consult Note (Signed)
WOC ostomy follow up Stoma type/location: RLQ Ileostomy Stomal assessment/size: Not assessed today. Peristomal assessment: Not assessed Treatment options for stomal/peristomal skin: 1 piece convex pouch Output Liquid brown stool Ostomy pouching: 1pc. convex Education provided: Checked with patient.  She has changed the pouch over the weekend.  She still had a pouch at bedside and no needs at this time. Will continue to check on this patient while here.  Enrolled patient in [place] Start Discharge program: No WOC team will remain available. Domenic Moras RN BSN [place] Pager [phone]

## 2015-05-03 NOTE — Progress Notes (Signed)
Pt complain of burning and stated, "please take this out or I will take it out myself." I offered to decrease rate to help with burning. Pt upset and adamant to stopped potassium. On call provider notified.

## 2015-05-03 NOTE — Discharge Instructions (Signed)

## 2015-05-03 NOTE — Progress Notes (Signed)
Critical lab value potassium 2.7. On call provider notified. No new orders at this time. Will continue to monitor.

## 2015-05-03 NOTE — Progress Notes (Signed)
Pt extremely agitated and rude to staff.  Refusing any further IV K Runs.  On call provider called to make aware of latest lab draw taken at 1815 during 2nd K run out of 4 total ordered.  Orders for PO Potassium given.  Director of floor and night RN made pt aware of plan.

## 2015-05-04 NOTE — Discharge Summary (Signed)
Physician Discharge Summary  Cindy [name] [id number] DOB: 19-Aug-1991 DOA: 04/30/2015  PCP: Vidal Schwalbe, MD  Admit date: 04/30/2015 Discharge date: 05/03/2015   Recommendations for Outpatient Follow-Up:   1. PCP please recheck electrolytes in 3 days. Patient instructed to call your office for appointment as she was discharged after hours. 2. Please follow-up on stool magnesium, potassium and sodium. The patient's presenting labs suggest ingestion of osmotic active substances.   Discharge Diagnosis:   Principal Problem:    Acute renal failure Active Problems:    Hypokalemia    Nausea with vomiting    Adenocarcinoma of colon    Dehydration    Hyponatremia    Metabolic acidosis    High output ileostomy    FAP (familial adenomatous polyposis)    ARF (acute renal failure)    Arterial hypotension    Hypermagnesemia    Hyperphosphatemia    Normocytic anemia   Discharge disposition:  Home.    Discharge Condition: Improved.  Diet recommendation:  Regular.   History of Present Illness:   Cindy [name] is an 24 y.o. female with a PMH of colon cancer diagnosed 04/2013 status post subtotal colectomy/en bloc resection terminal ileum, background FAP complicated by severe malnutrition and high ostomy output with recurrent hospitalizations for dehydration, who was admitted 04/30/15 with a 24 hour history of nausea and vomiting. Upon initial evaluation in the ED, the patient was noted to have a creatinine of 3.95, sodium of 125, and a potassium of 2.7. Magnesium and phosphorus were both elevated.  Hospital Course by Problem:   Principal Problem:  Acute renal failure secondary to high output ileostomy and nausea and vomiting/dehydration - Creatinine back to baseline values at discharge after vigorous IV fluids.. - Stool osmolality 340, serum osmolality 284, stool sodium, potassium and magnesium still pending. - Stool electrolytes checked as admitting labs showed  hypermagnesemia and hyperphosphatemia in the setting of hypokalemia. - F/U stool sodium, magnesium, potassium and osmolal gap.  Active Problems:  Normocytic anemia - Significant drop in hemoglobin noted, likely reflective of hemoconcentration on admission with dilutional changes. - Baseline hemoglobin 8.5. Discharge hemoglobin was hemoglobin 8.5.   Klebsiella pneumoniae UTI - Treated with IV Rocephin when in the hospital. - Discharged home on 3 additional days of treatment with ampicillin based on sensitivities.   Metabolic acidosis - Likely from bicarbonate losses in the stool. Resolved with sodium bicarbonate in IV fluids.    Hypermagnesemia/hyperphosphatemia/hypomagnesemia/hypokalemia - Normalized with IV fluids. Electrolytes were monitored and supplemented when needed. - Follow-up stool sodium, magnesium, potassium and osmolal gap. - Denies laxative abuse. Took a few doses of Herbal Life. Drinks Gatorade. - Serum osmolality WNL at 284, but stool osmolality 340. - Constellation of lab values/electrolytes on admission suggest ingestion of osmotic active agent.   Hypokalemia - Monitored and supplemented.   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   Hyponatremia - Likely from hypovolemia. Resolved with correction of dehydration.    Medical Consultants:    None.   Discharge Exam:   Filed Vitals:   05/03/15 1420  BP: 87/47  Pulse: 95  Temp: 98.3 F (36.8 C)  Resp: 20   Filed Vitals:   05/02/15 1422 05/02/15 2025 05/03/15 0535 05/03/15 1420  BP: 97/57 99/55 91/47  87/47  Pulse: 100 111 100 95  Temp: 98.4 F (36.9 C) 98.2 F (36.8 C) 98.3 F (36.8 C) 98.3 F (36.8 C)  TempSrc: Oral Oral Oral Oral  Resp: 16 16 16  20  Height:      Weight:      SpO2: 99% 100% 100% 100%    Gen:  NAD Cardiovascular:  RRR, No M/R/G Respiratory: Lungs CTAB Gastrointestinal: Abdomen soft, NT/ND with normal active bowel  sounds. Extremities: No C/E/C   The results of significant diagnostics from this hospitalization (including imaging, microbiology, ancillary and laboratory) are listed below for reference.     Procedures and Diagnostic Studies:   No results found.   Labs:   Basic Metabolic Panel:  Recent Labs Lab 04/30/15 0604 05/01/15 0422 05/02/15 0506 05/03/15 0355 05/03/15 1353 05/03/15 1815  NA 125* 139 144 139 140 140  K 2.7* 4.3 4.6 2.7* 2.9* 3.2*  CL 91* 119* 124* 108 106 106  CO2 16* 14* 15* 23 28 26   GLUCOSE 124* 124* 96 120* 113* 99  BUN 56* 29* 17 10 8 8   CREATININE 3.95* 2.15* 1.82* 1.26* 1.09* 1.15*  CALCIUM 9.8 8.5* 8.3* 7.3* 7.4* 7.9*  MG 2.8* 1.7  --  1.1* 2.0  --   PHOS 7.6* 2.7  --   --   --   --    GFR Estimated Creatinine Clearance: 55.7 mL/min (by C-G formula based on Cr of 1.15). Liver Function Tests:  Recent Labs Lab 04/30/15 0604  AST 22  ALT 17  ALKPHOS 109  BILITOT 0.5  PROT 10.2*  ALBUMIN 5.2*    Recent Labs Lab 04/30/15 0604  LIPASE 46   CBC:  Recent Labs Lab 04/30/15 0604 05/01/15 0422 05/03/15 0355  WBC 12.9* 11.5* 6.6  NEUTROABS 9.7*  --   --   HGB 14.7 9.5* 8.5*  HCT 40.9 28.1* 25.3*  MCV 80.8 83.6 84.9  PLT 495* 295 212   Microbiology Recent Results (from the past 240 hour(s))  Urine culture     Status: None   Collection Time: 04/30/15  8:11 AM  Result Value Ref Range Status   Specimen Description URINE, RANDOM  Final   Special Requests NONE  Final   Colony Count   Final    >=100,000 COLONIES/ML Performed at [facility]    Culture   Final    KLEBSIELLA PNEUMONIAE Performed at [facility]    Report Status 05/03/2015 FINAL  Final   Organism ID, Bacteria KLEBSIELLA PNEUMONIAE  Final      Susceptibility   Klebsiella pneumoniae - MIC*    AMPICILLIN >=32 RESISTANT Resistant     CEFAZOLIN <=4 SENSITIVE Sensitive     CEFTRIAXONE <=1 SENSITIVE Sensitive     CIPROFLOXACIN <=0.25 SENSITIVE Sensitive      GENTAMICIN <=1 SENSITIVE Sensitive     LEVOFLOXACIN <=0.12 SENSITIVE Sensitive     NITROFURANTOIN 64 INTERMEDIATE Intermediate     TOBRAMYCIN <=1 SENSITIVE Sensitive     TRIMETH/SULFA <=20 SENSITIVE Sensitive     PIP/TAZO <=4 SENSITIVE Sensitive     * KLEBSIELLA PNEUMONIAE     Discharge Instructions:   Discharge Instructions    Call MD for:  persistant nausea and vomiting    Complete by:  As directed      Call MD for:  severe uncontrolled pain    Complete by:  As directed      Diet general    Complete by:  As directed      Discharge instructions    Complete by:  As directed   Do not take laxatives or supplements of any kind without checking with your doctor first.     Increase activity slowly  Complete by:  As directed             Medication List    TAKE these medications        ampicillin 250 MG capsule  Commonly known as:  PRINCIPEN  Take 1 capsule (250 mg total) by mouth 3 (three) times daily.     EPINEPHrine 0.3 mg/0.3 mL Soaj injection  Commonly known as:  EPI-PEN  Inject 0.3 mg into the muscle as needed (allergic reaction.).     lactose free nutrition Liqd  Take 237 mLs by mouth 2 (two) times daily between meals.     feeding supplement (RESOURCE BREEZE) Liqd  Take 1 Container by mouth 3 (three) times daily between meals.     Loperamide HCl 1 MG/7.5ML Liqd  Commonly known as:  IMODIUM A-D  Take 15 mLs (2 mg total) by mouth every 6 (six) hours as needed (for diarrhea).     ondansetron 8 MG disintegrating tablet  Commonly known as:  ZOFRAN ODT  Take 1 tablet (8 mg total) by mouth every 8 (eight) hours as needed for nausea or vomiting.     ONE DAILY PLUS IRON Tabs  Take 1 tablet by mouth daily.     potassium chloride 10 MEQ tablet  Commonly known as:  K-DUR  Take 3 tabs daily.     promethazine 12.5 MG tablet  Commonly known as:  PHENERGAN  Take 1 tablet (12.5 mg total) by mouth every 6 (six) hours as needed for nausea or vomiting.             Follow-up Information    Follow up with Vidal Schwalbe, MD. Schedule an appointment as soon as possible for a visit in 3 days.   Specialty:  Family Medicine   Why:  For blood work & to review your test results.   Contact information:   [address] [place] [address] [phone]        Time coordinating discharge: 35 minutes.  Signed:  RAMA,CHRISTINA  Pager [phone] [facility] 05/04/2015, 7:11 AM

## 2015-05-07 LAB — POTASSIUM, STOOL: Potassium, Stl: 22 mEq/L

## 2015-05-07 LAB — SODIUM, STOOL: Sodium, Stl: 74.2 mEq/L

## 2015-05-14 LAB — MAGNESIUM, FECES

## 2015-07-28 ENCOUNTER — Encounter (HOSPITAL_COMMUNITY): Payer: Self-pay | Admitting: Nurse Practitioner

## 2015-07-28 ENCOUNTER — Emergency Department (HOSPITAL_COMMUNITY): Payer: 59

## 2015-07-28 ENCOUNTER — Inpatient Hospital Stay (HOSPITAL_COMMUNITY)
Admission: EM | Admit: 2015-07-28 | Discharge: 2015-07-31 | DRG: 392 | Discharge status: Against Medical Advice | Payer: 59 | Attending: Internal Medicine | Admitting: Internal Medicine

## 2015-07-28 DIAGNOSIS — Z8701 Personal history of pneumonia (recurrent): Secondary | ICD-10-CM

## 2015-07-28 DIAGNOSIS — D649 Anemia, unspecified: Secondary | ICD-10-CM | POA: Diagnosis present

## 2015-07-28 DIAGNOSIS — Z932 Ileostomy status: Secondary | ICD-10-CM

## 2015-07-28 DIAGNOSIS — A084 Viral intestinal infection, unspecified: Principal | ICD-10-CM | POA: Diagnosis present

## 2015-07-28 DIAGNOSIS — E872 Acidosis: Secondary | ICD-10-CM | POA: Diagnosis present

## 2015-07-28 DIAGNOSIS — Z881 Allergy status to other antibiotic agents status: Secondary | ICD-10-CM

## 2015-07-28 DIAGNOSIS — R52 Pain, unspecified: Secondary | ICD-10-CM

## 2015-07-28 DIAGNOSIS — C189 Malignant neoplasm of colon, unspecified: Secondary | ICD-10-CM | POA: Diagnosis present

## 2015-07-28 DIAGNOSIS — Z79899 Other long term (current) drug therapy: Secondary | ICD-10-CM

## 2015-07-28 DIAGNOSIS — I9589 Other hypotension: Secondary | ICD-10-CM | POA: Diagnosis present

## 2015-07-28 DIAGNOSIS — Z9049 Acquired absence of other specified parts of digestive tract: Secondary | ICD-10-CM | POA: Diagnosis present

## 2015-07-28 DIAGNOSIS — R198 Other specified symptoms and signs involving the digestive system and abdomen: Secondary | ICD-10-CM

## 2015-07-28 DIAGNOSIS — Z91018 Allergy to other foods: Secondary | ICD-10-CM

## 2015-07-28 DIAGNOSIS — E876 Hypokalemia: Secondary | ICD-10-CM | POA: Diagnosis present

## 2015-07-28 DIAGNOSIS — I959 Hypotension, unspecified: Secondary | ICD-10-CM | POA: Diagnosis present

## 2015-07-28 DIAGNOSIS — R109 Unspecified abdominal pain: Secondary | ICD-10-CM

## 2015-07-28 DIAGNOSIS — N189 Chronic kidney disease, unspecified: Secondary | ICD-10-CM

## 2015-07-28 DIAGNOSIS — Z9109 Other allergy status, other than to drugs and biological substances: Secondary | ICD-10-CM

## 2015-07-28 DIAGNOSIS — Z87440 Personal history of urinary (tract) infections: Secondary | ICD-10-CM

## 2015-07-28 DIAGNOSIS — R112 Nausea with vomiting, unspecified: Secondary | ICD-10-CM | POA: Diagnosis not present

## 2015-07-28 DIAGNOSIS — D1391 Familial adenomatous polyposis: Secondary | ICD-10-CM | POA: Diagnosis present

## 2015-07-28 DIAGNOSIS — E871 Hypo-osmolality and hyponatremia: Secondary | ICD-10-CM | POA: Diagnosis present

## 2015-07-28 DIAGNOSIS — D126 Benign neoplasm of colon, unspecified: Secondary | ICD-10-CM | POA: Diagnosis present

## 2015-07-28 DIAGNOSIS — N179 Acute kidney failure, unspecified: Secondary | ICD-10-CM | POA: Diagnosis present

## 2015-07-28 DIAGNOSIS — E861 Hypovolemia: Secondary | ICD-10-CM | POA: Diagnosis present

## 2015-07-28 DIAGNOSIS — Z87442 Personal history of urinary calculi: Secondary | ICD-10-CM

## 2015-07-28 DIAGNOSIS — E860 Dehydration: Secondary | ICD-10-CM | POA: Diagnosis present

## 2015-07-28 LAB — CBC
HEMATOCRIT: 43.9 % (ref 36.0–46.0)
Hemoglobin: 15.2 g/dL — ABNORMAL HIGH (ref 12.0–15.0)
MCH: 28.3 pg (ref 26.0–34.0)
MCHC: 34.6 g/dL (ref 30.0–36.0)
MCV: 81.8 fL (ref 78.0–100.0)
Platelets: 566 10*3/uL — ABNORMAL HIGH (ref 150–400)
RBC: 5.37 MIL/uL — AB (ref 3.87–5.11)
RDW: 13.7 % (ref 11.5–15.5)
WBC: 21 10*3/uL — AB (ref 4.0–10.5)

## 2015-07-28 LAB — COMPREHENSIVE METABOLIC PANEL
ALT: 24 U/L (ref 14–54)
AST: 29 U/L (ref 15–41)
Albumin: 5.8 g/dL — ABNORMAL HIGH (ref 3.5–5.0)
Alkaline Phosphatase: 109 U/L (ref 38–126)
Anion gap: 18 — ABNORMAL HIGH (ref 5–15)
BUN: 48 mg/dL — ABNORMAL HIGH (ref 6–20)
CO2: 16 mmol/L — ABNORMAL LOW (ref 22–32)
Calcium: 9.9 mg/dL (ref 8.9–10.3)
Chloride: 93 mmol/L — ABNORMAL LOW (ref 101–111)
Creatinine, Ser: 4.95 mg/dL — ABNORMAL HIGH (ref 0.44–1.00)
GFR, EST AFRICAN AMERICAN: 13 mL/min — AB (ref >60)
GFR, EST NON AFRICAN AMERICAN: 11 mL/min — AB (ref >60)
Glucose, Bld: 118 mg/dL — ABNORMAL HIGH (ref 65–99)
POTASSIUM: 3.5 mmol/L (ref 3.5–5.1)
Sodium: 127 mmol/L — ABNORMAL LOW (ref 135–145)
TOTAL PROTEIN: 10.5 g/dL — AB (ref 6.5–8.1)
Total Bilirubin: 0.8 mg/dL (ref 0.3–1.2)

## 2015-07-28 LAB — LIPASE, BLOOD: LIPASE: 39 U/L (ref 22–51)

## 2015-07-28 MED ORDER — SODIUM CHLORIDE 0.9 % IV BOLUS (SEPSIS)
2000.0000 mL | Freq: Once | INTRAVENOUS | Status: AC
  Administered 2015-07-28: 2000 mL via INTRAVENOUS

## 2015-07-28 MED ORDER — IOHEXOL 300 MG/ML  SOLN
25.0000 mL | Freq: Once | INTRAMUSCULAR | Status: DC | PRN
Start: 2015-07-28 — End: 2015-07-31

## 2015-07-28 MED ORDER — ONDANSETRON HCL 4 MG/2ML IJ SOLN
4.0000 mg | Freq: Once | INTRAMUSCULAR | Status: AC | PRN
  Administered 2015-07-28: 4 mg via INTRAVENOUS
  Filled 2015-07-28: qty 2

## 2015-07-28 MED ORDER — HYDROMORPHONE HCL 1 MG/ML IJ SOLN
1.0000 mg | Freq: Once | INTRAMUSCULAR | Status: AC
  Administered 2015-07-28: 1 mg via INTRAVENOUS
  Filled 2015-07-28: qty 1

## 2015-07-28 NOTE — ED Notes (Signed)
Pt is c/o abdominal pain 10/10, states she has been vomiting all day. B/p at triage 70/42 best of 3 three takes.

## 2015-07-28 NOTE — ED Notes (Signed)
Nurse in the room trying to start a ultrasound IV

## 2015-07-28 NOTE — ED Provider Notes (Signed)
CSN: [id number]     Arrival date & time 07/28/15  2148 History   First MD Initiated Contact with Patient 07/28/15 2229     Chief Complaint  Patient presents with  . Abdominal Pain  . Emesis     (Consider location/radiation/quality/duration/timing/severity/associated sxs/prior Treatment) Patient is a 24 y.o. female presenting with abdominal pain.  Abdominal Pain Pain location:  Generalized Pain quality: cramping and sharp   Pain radiates to:  Does not radiate Pain severity:  Moderate Onset quality:  Gradual Duration:  1 day Timing:  Constant Progression:  Unchanged Chronicity:  New Context: recent illness     Past Medical History  Diagnosis Date  . UTI (lower urinary tract infection)   . Heart murmur   . Pneumonia 2008; 2012  . Complication of anesthesia     "I swallowed the anesthesia when appendix taken out; got pneumonia"  . Iron deficiency anemia   . History of blood transfusion 04/26/2013    "4 bags" (05/21/2013)  . Colon cancer 2014    s/p resection and ileostomy  . 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 FAP (familial adenomatous polyposis) 09/30/2014  . Nephrolithiasis 09/30/2014   Past Surgical History  Procedure Laterality Date  . Leg reconstruction using fascial flap  ~ 2009  . Still born baby  [birth year]    delivered vaginally @ ~ 24 weeks  . Appendectomy  2008  . Colonoscopy N/A 05/24/2013    Procedure: COLONOSCOPY;  Surgeon: Missy Sabins, MD;  Location: [facility];  Service: Endoscopy;  Laterality: N/A;  . Colon surgery    . Ileostomy  06/26/2013  . I&d extremity Left 11/14/2013    Procedure: IRRIGATION AND DEBRIDEMENT LEFT LONG FINGER;  Surgeon: Tennis Must, MD;  Location: [facility];  Service: Orthopedics;  Laterality: Left;   Family History  Problem Relation Age of Onset  . Adopted: Yes  . Alcohol abuse Neg Hx   . Arthritis Neg Hx   . Asthma Neg Hx   . Birth defects Neg Hx   . Cancer Neg Hx   . COPD Neg Hx    . Depression Neg Hx   . Diabetes Neg Hx   . Drug abuse Neg Hx   . Early death Neg Hx   . Hearing loss Neg Hx   . Heart disease Neg Hx   . Hyperlipidemia Neg Hx   . Hypertension Neg Hx   . Kidney disease Neg Hx   . Learning disabilities Neg Hx   . Mental illness Neg Hx   . Mental retardation Neg Hx   . Miscarriages / Stillbirths Neg Hx   . Stroke Neg Hx   . Vision loss Neg Hx   . ALS Mother    Social History  Substance Use Topics  . Smoking status: Never Smoker   . Smokeless tobacco: Never Used  . Alcohol Use: No   OB History    Gravida Para Term Preterm AB TAB SAB Ectopic Multiple Living   1 1  1            Review of Systems  Gastrointestinal: Positive for abdominal pain.  All other systems reviewed and are negative.     Allergies  Soap; Orange fruit; and Levaquin  Home Medications   Prior to Admission medications   Medication Sig Start Date End Date Taking? Authorizing Provider  citric acid-potassium citrate (POLYCITRA) 1100-334 MG/5ML solution Take 30 mLs by mouth 4 (four) times  daily - after meals and at bedtime. 07/22/15  Yes Historical Provider, MD  diphenoxylate-atropine (LOMOTIL) 2.5-0.025 MG/5ML liquid Take 5 mLs by mouth 4 (four) times daily as needed. 07/22/15 08/01/15 Yes Historical Provider, MD  EPINEPHrine 0.3 mg/0.3 mL IJ SOAJ injection Inject 0.3 mg into the muscle as needed (allergic reaction.). 08/10/13  Yes Sharmon Leyden, MD  feeding supplement, RESOURCE BREEZE, (RESOURCE BREEZE) LIQD Take 1 Container by mouth 3 (three) times daily between meals. 05/03/15  Yes Venetia Maxon Rama, MD  magnesium oxide (MAG-OX) 400 MG tablet Take 400 mg by mouth 2 (two) times daily. 07/22/15  Yes Historical Provider, MD  promethazine (PHENERGAN) 12.5 MG tablet Take 1 tablet (12.5 mg total) by mouth every 6 (six) hours as needed for nausea or vomiting. 12/09/14  Yes Theodis Blaze, MD  ampicillin (PRINCIPEN) 250 MG capsule Take 1 capsule (250 mg total) by mouth 3 (three) times  daily. Patient not taking: Reported on 07/28/2015 05/03/15   Venetia Maxon Rama, MD  lactose free nutrition (BOOST PLUS) LIQD Take 237 mLs by mouth 2 (two) times daily between meals. Patient not taking: Reported on 12/06/2014 09/09/14   Robbie Lis, MD  Loperamide HCl (IMODIUM A-D) 1 MG/7.5ML LIQD Take 15 mLs (2 mg total) by mouth every 6 (six) hours as needed (for diarrhea). Patient not taking: Reported on 07/28/2015 02/17/15   Barton Dubois, MD  Multiple Vitamins-Minerals (ONE DAILY PLUS IRON) TABS Take 1 tablet by mouth daily. Patient not taking: Reported on 04/30/2015 02/17/15   Barton Dubois, MD  ondansetron (ZOFRAN ODT) 8 MG disintegrating tablet Take 1 tablet (8 mg total) by mouth every 8 (eight) hours as needed for nausea or vomiting. Patient not taking: Reported on 04/30/2015 02/17/15   Barton Dubois, MD  potassium chloride (K-DUR) 10 MEQ tablet Take 3 tabs daily. Patient not taking: Reported on 04/30/2015 02/17/15   Barton Dubois, MD   BP 87/45 mmHg  Pulse 74  Temp(Src) 97.9 F (36.6 C) (Oral)  Resp 20  Ht 5\' 1"  (1.549 m)  Wt 100 lb 15.5 oz (45.8 kg)  BMI 19.09 kg/m2  SpO2 100%  LMP 07/21/2015 Physical Exam  Constitutional: She is oriented to person, place, and time. She appears well-developed and well-nourished.  HENT:  Head: Normocephalic and atraumatic.  Right Ear: External ear normal.  Left Ear: External ear normal.  Eyes: Conjunctivae and EOM are normal. Pupils are equal, round, and reactive to light.  Neck: Normal range of motion. Neck supple.  Cardiovascular: Regular rhythm, normal heart sounds and intact distal pulses.  Tachycardia present.   Pulmonary/Chest: Effort normal and breath sounds normal.  Abdominal: Soft. Bowel sounds are normal. There is generalized tenderness.  Musculoskeletal: Normal range of motion.  Neurological: She is alert and oriented to person, place, and time.  Skin: Skin is warm and dry.  Vitals reviewed.   ED Course  Procedures (including critical  care time) Labs Review Labs Reviewed  COMPREHENSIVE METABOLIC PANEL - Abnormal; Notable for the following:    Sodium 127 (*)    Chloride 93 (*)    CO2 16 (*)    Glucose, Bld 118 (*)    BUN 48 (*)    Creatinine, Ser 4.95 (*)    Total Protein 10.5 (*)    Albumin 5.8 (*)    GFR calc non Af Amer 11 (*)    GFR calc Af Amer 13 (*)    Anion gap 18 (*)    All other components within normal limits  CBC - Abnormal; Notable for the following:    WBC 21.0 (*)    RBC 5.37 (*)    Hemoglobin 15.2 (*)    Platelets 566 (*)    All other components within normal limits  URINALYSIS, ROUTINE W REFLEX MICROSCOPIC (NOT AT [facility]) - Abnormal; Notable for the following:    APPearance CLOUDY (*)    Hgb urine dipstick TRACE (*)    Leukocytes, UA TRACE (*)    All other components within normal limits  PHOSPHORUS - Abnormal; Notable for the following:    Phosphorus 6.7 (*)    All other components within normal limits  OSMOLALITY, URINE - Abnormal; Notable for the following:    Osmolality, Ur 220 (*)    All other components within normal limits  CBC WITH DIFFERENTIAL/PLATELET - Abnormal; Notable for the following:    WBC 15.4 (*)    RBC 3.69 (*)    Hemoglobin 10.4 (*)    HCT 30.7 (*)    Neutrophils Relative % 79 (*)    Neutro Abs 12.1 (*)    Monocytes Absolute 1.1 (*)    All other components within normal limits  PHOSPHORUS - Abnormal; Notable for the following:    Phosphorus 5.6 (*)    All other components within normal limits  COMPREHENSIVE METABOLIC PANEL - Abnormal; Notable for the following:    Sodium 132 (*)    CO2 9 (*)    BUN 34 (*)    Creatinine, Ser 3.26 (*)    Calcium 7.2 (*)    Total Protein 6.1 (*)    Albumin 3.3 (*)    GFR calc non Af Amer 18 (*)    GFR calc Af Amer 21 (*)    All other components within normal limits  URINE MICROSCOPIC-ADD ON - Abnormal; Notable for the following:    Casts GRANULAR CAST (*)    All other components within normal limits  BASIC METABOLIC PANEL -  Abnormal; Notable for the following:    Potassium 3.3 (*)    Chloride 116 (*)    CO2 13 (*)    BUN 32 (*)    Creatinine, Ser 2.82 (*)    Calcium 7.4 (*)    GFR calc non Af Amer 22 (*)    GFR calc Af Amer 25 (*)    All other components within normal limits  BASIC METABOLIC PANEL - Abnormal; Notable for the following:    Potassium 2.7 (*)    Chloride 119 (*)    CO2 12 (*)    Glucose, Bld 106 (*)    Creatinine, Ser 1.61 (*)    Calcium 7.5 (*)    GFR calc non Af Amer 43 (*)    GFR calc Af Amer 50 (*)    All other components within normal limits  CBC - Abnormal; Notable for the following:    RBC 3.51 (*)    Hemoglobin 9.9 (*)    HCT 29.9 (*)    All other components within normal limits  MRSA PCR SCREENING  URINE CULTURE  CULTURE, BLOOD (ROUTINE X 2)  CULTURE, BLOOD (ROUTINE X 2)  LIPASE, BLOOD  MAGNESIUM  PROCALCITONIN  NA AND K (SODIUM & POTASSIUM), RAND UR  OSMOLALITY  CREATININE, URINE, RANDOM  LACTIC ACID, PLASMA  LACTIC ACID, PLASMA  PROTIME-INR  APTT  MAGNESIUM  TSH  CALCIUM, IONIZED  I-STAT BETA HCG BLOOD, ED ([facility], [facility], [facility] ONLY)  I-STAT CG4 LACTIC ACID, ED  I-STAT CG4 LACTIC ACID, ED  Imaging Review Ct Abdomen Pelvis Wo Contrast  07/29/2015   CLINICAL DATA:  Abdominal pain and vomiting.  Hypotensive.  EXAM: CT ABDOMEN AND PELVIS WITHOUT CONTRAST  TECHNIQUE: Multidetector CT imaging of the abdomen and pelvis was performed following the standard protocol without IV contrast.  COMPARISON:  09/07/2014  FINDINGS: There is a 7 x 10 mm right midpole collecting system calculus. There is no ureteral calculus. There is no hydronephrosis. The kidneys are otherwise unremarkable. There are unremarkable unenhanced appearances of the liver, gallbladder, spleen, pancreas and adrenals.  The stomach and small bowel appear unremarkable. There are unremarkable appearances of the right lower quadrant colostomy.  There is no evidence of bowel obstruction. There is no extraluminal air.  There is no focal inflammatory change in the abdomen or pelvis.  The abdominal aorta is normal in caliber. There is no atherosclerotic calcification. There is no adenopathy in the abdomen or pelvis.  There is a small volume ascites. There is a loculated appearing pelvic fluid collection posterior to the uterus and at the right lateral aspect of the uterus, possibly surrounding the ovary although the ovaries are not optimally seen.  There is no significant abnormality in the lower chest. There is no significant musculoskeletal abnormality.  IMPRESSION: 1. Loculated appearing pelvic fluid collections posterior and to the right of the uterus. Consider pelvic sonography for characterization. Relationship of the fluid collections to the ovaries is inconclusive on this study. 2. Right nephrolithiasis. 3. Negative for bowel obstruction or perforation. No focal inflammatory changes are evident. 4. Unremarkable appearances of the right lower quadrant colostomy.   Electronically Signed   By: Andreas Newport M.D.   On: 07/29/2015 05:03   Dg Chest 2 View  07/28/2015   CLINICAL DATA:  Cough, vomiting.  History of colon cancer.  EXAM: CHEST  2 VIEW  COMPARISON:  Chest CT 05/13/2014  FINDINGS: The heart size and mediastinal contours are within normal limits. Both lungs are clear. The visualized skeletal structures are unremarkable.  IMPRESSION: No active cardiopulmonary disease.   Electronically Signed   By: Conchita Paris M.D.   On: 07/28/2015 23:37   I have personally reviewed and evaluated these images and lab results as part of my medical decision-making.   EKG Interpretation   Date/Time:  Wednesday July 28 2015 22:15:48 EDT Ventricular Rate:  104 PR Interval:  140 QRS Duration: 71 QT Interval:  319 QTC Calculation: 419 R Axis:   82 Text Interpretation:  Sinus tachycardia Biatrial enlargement No  significant change since last tracing Confirmed by Debby Freiberg [phone])  on 07/28/2015 11:46:53 PM      CRITICAL CARE Performed by: Debby Freiberg   Total critical care time: 35 min  Critical care time was exclusive of separately billable procedures and treating other patients.  Critical care was necessary to treat or prevent imminent or life-threatening deterioration.  Critical care was time spent personally by me on the following activities: development of treatment plan with patient and/or surrogate as well as nursing, discussions with consultants, evaluation of patient's response to treatment, examination of patient, obtaining history from patient or surrogate, ordering and performing treatments and interventions, ordering and review of laboratory studies, ordering and review of radiographic studies, pulse oximetry and re-evaluation of patient's condition.  MDM   Final diagnoses:  Abdominal pain    24 y.o. female with pertinent PMH of colon ca (familial adenomatous polyposis) presents with abdominal pain, nausea and vomiting over the last 3 days. On arrival today the patient has vital  signs and physical exam as above. Systolic blood pressure 70 on my examination. Patient states her normal blood pressure is approximately 90 systolic.  2 L of normal saline given with resolution of hypertension. Workup as above. Admitted in stable condition to stepdown.  I have reviewed all laboratory and imaging studies if ordered as above  1. Abdominal pain         Debby Freiberg, MD 07/30/15 [phone]

## 2015-07-29 ENCOUNTER — Encounter (HOSPITAL_COMMUNITY): Payer: Self-pay | Admitting: Internal Medicine

## 2015-07-29 DIAGNOSIS — N179 Acute kidney failure, unspecified: Secondary | ICD-10-CM | POA: Diagnosis not present

## 2015-07-29 DIAGNOSIS — Z932 Ileostomy status: Secondary | ICD-10-CM | POA: Diagnosis not present

## 2015-07-29 DIAGNOSIS — Z8701 Personal history of pneumonia (recurrent): Secondary | ICD-10-CM | POA: Diagnosis not present

## 2015-07-29 DIAGNOSIS — Z9109 Other allergy status, other than to drugs and biological substances: Secondary | ICD-10-CM | POA: Diagnosis not present

## 2015-07-29 DIAGNOSIS — E871 Hypo-osmolality and hyponatremia: Secondary | ICD-10-CM | POA: Diagnosis not present

## 2015-07-29 DIAGNOSIS — I9589 Other hypotension: Secondary | ICD-10-CM | POA: Diagnosis present

## 2015-07-29 DIAGNOSIS — E860 Dehydration: Secondary | ICD-10-CM | POA: Diagnosis not present

## 2015-07-29 DIAGNOSIS — Z79899 Other long term (current) drug therapy: Secondary | ICD-10-CM | POA: Diagnosis not present

## 2015-07-29 DIAGNOSIS — R112 Nausea with vomiting, unspecified: Secondary | ICD-10-CM | POA: Diagnosis present

## 2015-07-29 DIAGNOSIS — Z881 Allergy status to other antibiotic agents status: Secondary | ICD-10-CM | POA: Diagnosis not present

## 2015-07-29 DIAGNOSIS — E876 Hypokalemia: Secondary | ICD-10-CM | POA: Diagnosis present

## 2015-07-29 DIAGNOSIS — Z87440 Personal history of urinary (tract) infections: Secondary | ICD-10-CM | POA: Diagnosis not present

## 2015-07-29 DIAGNOSIS — E872 Acidosis: Secondary | ICD-10-CM | POA: Diagnosis present

## 2015-07-29 DIAGNOSIS — I951 Orthostatic hypotension: Secondary | ICD-10-CM

## 2015-07-29 DIAGNOSIS — D649 Anemia, unspecified: Secondary | ICD-10-CM | POA: Diagnosis present

## 2015-07-29 DIAGNOSIS — Z87442 Personal history of urinary calculi: Secondary | ICD-10-CM | POA: Diagnosis not present

## 2015-07-29 DIAGNOSIS — Z91018 Allergy to other foods: Secondary | ICD-10-CM | POA: Diagnosis not present

## 2015-07-29 DIAGNOSIS — K529 Noninfective gastroenteritis and colitis, unspecified: Secondary | ICD-10-CM | POA: Diagnosis not present

## 2015-07-29 DIAGNOSIS — A084 Viral intestinal infection, unspecified: Secondary | ICD-10-CM | POA: Diagnosis present

## 2015-07-29 DIAGNOSIS — C189 Malignant neoplasm of colon, unspecified: Secondary | ICD-10-CM | POA: Diagnosis present

## 2015-07-29 DIAGNOSIS — Z9049 Acquired absence of other specified parts of digestive tract: Secondary | ICD-10-CM | POA: Diagnosis present

## 2015-07-29 LAB — NA AND K (SODIUM & POTASSIUM), RAND UR
POTASSIUM UR: 23 mmol/L
Sodium, Ur: 21 mmol/L

## 2015-07-29 LAB — URINALYSIS, ROUTINE W REFLEX MICROSCOPIC
Bilirubin Urine: NEGATIVE
Glucose, UA: NEGATIVE mg/dL
KETONES UR: NEGATIVE mg/dL
Nitrite: NEGATIVE
PH: 5 (ref 5.0–8.0)
Protein, ur: NEGATIVE mg/dL
SPECIFIC GRAVITY, URINE: 1.007 (ref 1.005–1.030)
Urobilinogen, UA: 0.2 mg/dL (ref 0.0–1.0)

## 2015-07-29 LAB — BASIC METABOLIC PANEL
ANION GAP: 6 (ref 5–15)
BUN: 32 mg/dL — ABNORMAL HIGH (ref 6–20)
CALCIUM: 7.4 mg/dL — AB (ref 8.9–10.3)
CO2: 13 mmol/L — AB (ref 22–32)
Chloride: 116 mmol/L — ABNORMAL HIGH (ref 101–111)
Creatinine, Ser: 2.82 mg/dL — ABNORMAL HIGH (ref 0.44–1.00)
GFR calc non Af Amer: 22 mL/min — ABNORMAL LOW (ref >60)
GFR, EST AFRICAN AMERICAN: 25 mL/min — AB (ref >60)
Glucose, Bld: 93 mg/dL (ref 65–99)
Potassium: 3.3 mmol/L — ABNORMAL LOW (ref 3.5–5.1)
SODIUM: 135 mmol/L (ref 135–145)

## 2015-07-29 LAB — COMPREHENSIVE METABOLIC PANEL
ALBUMIN: 3.3 g/dL — AB (ref 3.5–5.0)
ALK PHOS: 66 U/L (ref 38–126)
ALT: 17 U/L (ref 14–54)
AST: 19 U/L (ref 15–41)
Anion gap: 12 (ref 5–15)
BILIRUBIN TOTAL: 0.4 mg/dL (ref 0.3–1.2)
BUN: 34 mg/dL — AB (ref 6–20)
CALCIUM: 7.2 mg/dL — AB (ref 8.9–10.3)
CO2: 9 mmol/L — AB (ref 22–32)
Chloride: 111 mmol/L (ref 101–111)
Creatinine, Ser: 3.26 mg/dL — ABNORMAL HIGH (ref 0.44–1.00)
GFR calc Af Amer: 21 mL/min — ABNORMAL LOW (ref >60)
GFR calc non Af Amer: 18 mL/min — ABNORMAL LOW (ref >60)
GLUCOSE: 96 mg/dL (ref 65–99)
POTASSIUM: 3.6 mmol/L (ref 3.5–5.1)
Sodium: 132 mmol/L — ABNORMAL LOW (ref 135–145)
TOTAL PROTEIN: 6.1 g/dL — AB (ref 6.5–8.1)

## 2015-07-29 LAB — URINE MICROSCOPIC-ADD ON

## 2015-07-29 LAB — OSMOLALITY: OSMOLALITY: 294 mosm/kg (ref 275–300)

## 2015-07-29 LAB — CBC WITH DIFFERENTIAL/PLATELET
BASOS ABS: 0 10*3/uL (ref 0.0–0.1)
BASOS PCT: 0 % (ref 0–1)
EOS ABS: 0 10*3/uL (ref 0.0–0.7)
EOS PCT: 0 % (ref 0–5)
HCT: 30.7 % — ABNORMAL LOW (ref 36.0–46.0)
Hemoglobin: 10.4 g/dL — ABNORMAL LOW (ref 12.0–15.0)
Lymphocytes Relative: 14 % (ref 12–46)
Lymphs Abs: 2.2 10*3/uL (ref 0.7–4.0)
MCH: 28.2 pg (ref 26.0–34.0)
MCHC: 33.9 g/dL (ref 30.0–36.0)
MCV: 83.2 fL (ref 78.0–100.0)
MONO ABS: 1.1 10*3/uL — AB (ref 0.1–1.0)
Monocytes Relative: 7 % (ref 3–12)
Neutro Abs: 12.1 10*3/uL — ABNORMAL HIGH (ref 1.7–7.7)
Neutrophils Relative %: 79 % — ABNORMAL HIGH (ref 43–77)
PLATELETS: 306 10*3/uL (ref 150–400)
RBC: 3.69 MIL/uL — ABNORMAL LOW (ref 3.87–5.11)
RDW: 13.7 % (ref 11.5–15.5)
WBC: 15.4 10*3/uL — ABNORMAL HIGH (ref 4.0–10.5)

## 2015-07-29 LAB — PROCALCITONIN: Procalcitonin: 0.1 ng/mL

## 2015-07-29 LAB — APTT: aPTT: 27 seconds (ref 24–37)

## 2015-07-29 LAB — PHOSPHORUS
Phosphorus: 6.7 mg/dL — ABNORMAL HIGH (ref 2.5–4.6)
Phosphorus: 5.6 mg/dL — ABNORMAL HIGH (ref 2.5–4.6)

## 2015-07-29 LAB — MAGNESIUM
Magnesium: 1.8 mg/dL (ref 1.7–2.4)
Magnesium: 1.7 mg/dL (ref 1.7–2.4)

## 2015-07-29 LAB — CREATININE, URINE, RANDOM: CREATININE, URINE: 70.1 mg/dL

## 2015-07-29 LAB — LACTIC ACID, PLASMA
LACTIC ACID, VENOUS: 1.1 mmol/L (ref 0.5–2.0)
Lactic Acid, Venous: 1 mmol/L (ref 0.5–2.0)

## 2015-07-29 LAB — MRSA PCR SCREENING: MRSA BY PCR: NEGATIVE

## 2015-07-29 LAB — PROTIME-INR
INR: 1.08 (ref 0.00–1.49)
PROTHROMBIN TIME: 14.2 s (ref 11.6–15.2)

## 2015-07-29 LAB — OSMOLALITY, URINE: Osmolality, Ur: 220 mOsm/kg — ABNORMAL LOW (ref 390–1090)

## 2015-07-29 LAB — TSH: TSH: 0.639 u[IU]/mL (ref 0.350–4.500)

## 2015-07-29 MED ORDER — ONDANSETRON HCL 4 MG PO TABS: 4.0000 mg | ORAL_TABLET | Freq: Four times a day (QID) | ORAL | Status: DC | PRN

## 2015-07-29 MED ORDER — ENOXAPARIN SODIUM 40 MG/0.4ML ~~LOC~~ SOLN: 40.0000 mg | SUBCUTANEOUS | Status: DC

## 2015-07-29 MED ORDER — ACETAMINOPHEN 325 MG PO TABS: 650.0000 mg | ORAL_TABLET | Freq: Four times a day (QID) | ORAL | Status: DC | PRN

## 2015-07-29 MED ORDER — PIPERACILLIN-TAZOBACTAM 3.375 G IVPB
3.3750 g | Freq: Three times a day (TID) | INTRAVENOUS | Status: DC
  Administered 2015-07-29: 3.375 g via INTRAVENOUS
  Filled 2015-07-29: qty 50

## 2015-07-29 MED ORDER — HYDROCODONE-ACETAMINOPHEN 5-325 MG PO TABS
1.0000 | ORAL_TABLET | ORAL | Status: DC | PRN
  Filled 2015-07-29: qty 1

## 2015-07-29 MED ORDER — SODIUM CHLORIDE 0.9 % IV BOLUS (SEPSIS)
1000.0000 mL | Freq: Once | INTRAVENOUS | Status: AC
  Administered 2015-07-29: 1000 mL via INTRAVENOUS

## 2015-07-29 MED ORDER — SODIUM CHLORIDE 0.9 % IV BOLUS (SEPSIS)
500.0000 mL | Freq: Once | INTRAVENOUS | Status: AC
  Administered 2015-07-29: 500 mL via INTRAVENOUS

## 2015-07-29 MED ORDER — SODIUM CHLORIDE 0.9 % IV SOLN
INTRAVENOUS | Status: DC
  Administered 2015-07-29: 2000 mL via INTRAVENOUS
  Administered 2015-07-29: 02:00:00 via INTRAVENOUS
  Administered 2015-07-30: 2000 mL via INTRAVENOUS

## 2015-07-29 MED ORDER — ONDANSETRON HCL 4 MG/2ML IJ SOLN
4.0000 mg | Freq: Once | INTRAMUSCULAR | Status: AC
  Administered 2015-07-29: 4 mg via INTRAVENOUS
  Filled 2015-07-29: qty 2

## 2015-07-29 MED ORDER — PROMETHAZINE HCL 25 MG/ML IJ SOLN
12.5000 mg | Freq: Once | INTRAMUSCULAR | Status: AC
  Administered 2015-07-29: 12.5 mg via INTRAVENOUS
  Filled 2015-07-29: qty 1

## 2015-07-29 MED ORDER — SODIUM CHLORIDE 0.9 % IJ SOLN
3.0000 mL | Freq: Two times a day (BID) | INTRAMUSCULAR | Status: DC
  Administered 2015-07-29 – 2015-07-30 (×3): 3 mL via INTRAVENOUS

## 2015-07-29 MED ORDER — ACETAMINOPHEN 650 MG RE SUPP: 650.0000 mg | Freq: Four times a day (QID) | RECTAL | Status: DC | PRN

## 2015-07-29 MED ORDER — SODIUM CHLORIDE 0.9 % IV BOLUS (SEPSIS)
500.0000 mL | INTRAVENOUS | Status: AC
  Administered 2015-07-29: 500 mL via INTRAVENOUS

## 2015-07-29 MED ORDER — ENOXAPARIN SODIUM 30 MG/0.3ML ~~LOC~~ SOLN
30.0000 mg | SUBCUTANEOUS | Status: DC
  Administered 2015-07-29: 30 mg via SUBCUTANEOUS
  Filled 2015-07-29: qty 0.3

## 2015-07-29 MED ORDER — ONDANSETRON HCL 4 MG/2ML IJ SOLN: 4.0000 mg | Freq: Four times a day (QID) | INTRAMUSCULAR | Status: DC | PRN

## 2015-07-29 NOTE — Care Management Note (Signed)
Case Management Note  Patient Details  Name: Cindy [name] MRN: [id number] Date of Birth: 08-02-1991  Subjective/Objective:           Food poisoning with dehydration and hypotension         Action/Plan: July 29, 2015 Velva Harman, RN, BSN, CCM: No discharge needs at time chart review.  Will follow for changes and needs.    Expected Discharge Date:                  Expected Discharge Plan:     In-House Referral:  NA  Discharge planning Services  CM Consult  Post Acute Care Choice:    Choice offered to:  NA  DME Arranged:    DME Agency:     HH Arranged:    [place] Agency:     Status of Service:  Completed, signed off  Medicare Important Message Given:    Date Medicare IM Given:    Medicare IM give by:    Date Additional Medicare IM Given:    Additional Medicare Important Message give by:     If discussed at [place] of Stay Meetings, dates discussed:    Additional Comments:  Leeroy Cha, RN 07/29/2015, 11:24 AM

## 2015-07-29 NOTE — Progress Notes (Signed)
[facility] ADULT ICU REPLACEMENT PROTOCOL FOR AM LAB REPLACEMENT ONLY  The patient does not apply for the [facility] Adult ICU Electrolyte Replacment Protocol based on the criteria listed below:    Is GFR >/= 40 ml/min? No.  Patient's GFR today is 18   Abnormal electrolyte(s): K3.6   If a panic level lab has been reported, has the CCM MD in charge been notified? Yes.  .   Physician:  Beatrix Shipper, MD  Vear Clock 07/29/2015 6:01 AM

## 2015-07-29 NOTE — H&P (Signed)
PCP:  Vidal Schwalbe, MD  [facility]  Referring provider Colin Rhein   Chief Complaint:  Nausea and vomiting  HPI: Cindy [name] is a 24 y.o. 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   Presented with Nausea and vomiting since this AM. Reports at least 3-5 episodes. She went out yesterday to eat out at [employer] and thinks she got food poisoning. Denies any fever no sick contacts. denies increased output from the ostomy. She reports some abdominal pain which was steady in nature and all over the abdomen now resolved with IV Dilaudid.   Of note Patient has history of familial adenomatous polyposis as been diagnosed with colon cancer status post colectomy with colostomy. He is chronically ill with blood pressures usually range in the 90s. Patient states she has nephrologist and then follow-up popped this. Review of her records her baseline creatinine is about 1 but she have had frequent admissions for dehydration with creatinine going as high as 1.9.     Hospitalist was called for admission for Dehydration, ARF  Review of Systems:    Pertinent positives include: nausea, vomiting,  Constitutional:  No weight loss, night sweats, Fevers, chills, fatigue, weight loss  HEENT:  No headaches, Difficulty swallowing,Tooth/dental problems,Sore throat,  No sneezing, itching, ear ache, nasal congestion, post nasal drip,  Cardio-vascular:  No chest pain, Orthopnea, PND, anasarca, dizziness, palpitations.no Bilateral lower extremity swelling  GI:  No heartburn, indigestion, abdominal pain,  diarrhea, change in bowel habits, loss of appetite, melena, blood in stool,  hematemesis Resp:  no shortness of breath at rest. No dyspnea on exertion, No excess mucus, no productive cough, No non-productive cough, No coughing up of blood.No change in color of mucus.No wheezing. Skin:  no rash or lesions. No jaundice GU:  no dysuria, change in color of urine, no urgency or frequency. No straining to urinate.  No flank pain.  Musculoskeletal:  No joint pain or no joint swelling. No decreased range of motion. No back pain.  Psych:  No change in mood or affect. No depression or anxiety. No memory loss.  Neuro: no localizing neurological complaints, no tingling, no weakness, no double vision, no gait abnormality, no slurred speech, no confusion  Otherwise ROS are negative except for above, 10 systems were reviewed  Past Medical History: Past Medical History  Diagnosis Date  . UTI (lower urinary tract infection)   . Heart murmur   . Pneumonia 2008; 2012  . Complication of anesthesia     "I swallowed the anesthesia when appendix taken out; got pneumonia"  . Iron deficiency anemia   . History of blood transfusion 04/26/2013    "4 bags" (05/21/2013)  . Colon cancer 2014    s/p resection and ileostomy  . 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 FAP (familial adenomatous polyposis) 09/30/2014  . Nephrolithiasis 09/30/2014   Past Surgical History  Procedure Laterality Date  . Leg reconstruction using fascial flap  ~ 2009  . Still born baby  [birth year]    delivered vaginally @ ~ 24 weeks  . Appendectomy  2008  . Colonoscopy N/A 05/24/2013    Procedure: COLONOSCOPY;  Surgeon: Missy Sabins, MD;  Location: [place];  Service: Endoscopy;  Laterality: N/A;  . Colon surgery    . Ileostomy  06/26/2013  . I&d extremity Left 11/14/2013    Procedure: IRRIGATION AND DEBRIDEMENT LEFT LONG FINGER;  Surgeon: Tennis Must, MD;  Location: [place];  Service: Orthopedics;  Laterality: Left;     Medications: Prior to Admission medications    Medication Sig Start Date End Date Taking? Authorizing Provider  citric acid-potassium citrate (POLYCITRA) 1100-334 MG/5ML solution Take 30 mLs by mouth 4 (four) times daily - after meals and at bedtime. 07/22/15  Yes Historical Provider, MD  diphenoxylate-atropine (LOMOTIL) 2.5-0.025 MG/5ML liquid Take 5 mLs by mouth 4 (four) times daily as needed. 07/22/15 08/01/15 Yes Historical Provider, MD  EPINEPHrine 0.3 mg/0.3 mL IJ SOAJ injection Inject 0.3 mg into the muscle as needed (allergic reaction.). 08/10/13  Yes Sharmon Leyden, MD  feeding supplement, RESOURCE BREEZE, (RESOURCE BREEZE) LIQD Take 1 Container by mouth 3 (three) times daily between meals. 05/03/15  Yes Venetia Maxon Rama, MD  magnesium oxide (MAG-OX) 400 MG tablet Take 400 mg by mouth 2 (two) times daily. 07/22/15  Yes Historical Provider, MD  promethazine (PHENERGAN) 12.5 MG tablet Take 1 tablet (12.5 mg total) by mouth every 6 (six) hours as needed for nausea or vomiting. 12/09/14  Yes Theodis Blaze, MD  ampicillin (PRINCIPEN) 250 MG capsule Take 1 capsule (250 mg total) by mouth 3 (three) times daily. Patient not taking: Reported on 07/28/2015 05/03/15   Venetia Maxon Rama, MD  lactose free nutrition (BOOST PLUS) LIQD Take 237 mLs by mouth 2 (two) times daily between meals. Patient not taking: Reported on 12/06/2014 09/09/14   Robbie Lis, MD  Loperamide HCl (IMODIUM A-D) 1 MG/7.5ML LIQD Take 15 mLs (2 mg total) by mouth every 6 (six) hours as needed (for diarrhea). Patient not taking: Reported on 07/28/2015 02/17/15   Barton Dubois, MD  Multiple Vitamins-Minerals (ONE DAILY PLUS IRON) TABS Take 1 tablet by mouth daily. Patient not taking: Reported on 04/30/2015 02/17/15   Barton Dubois, MD  ondansetron (ZOFRAN ODT) 8 MG disintegrating tablet Take 1 tablet (8 mg total) by mouth every 8 (eight) hours as needed for nausea or vomiting. Patient not taking: Reported on 04/30/2015 02/17/15   Barton Dubois, MD  potassium chloride (K-DUR) 10 MEQ tablet Take 3  tabs daily. Patient not taking: Reported on 04/30/2015 02/17/15   Barton Dubois, MD    Allergies:   Allergies  Allergen Reactions  . Soap Hives and Itching    *Dove Soap*  . 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 Levaquin [Levofloxacin] Hives    Social History:  Ambulatory  independently   Lives at home  With family     reports that she has never smoked. She has never used smokeless tobacco. She reports that she does not drink alcohol or use illicit drugs.    Family History: family history includes ALS in her mother. There is no history of Alcohol abuse, Arthritis, Asthma, Birth defects, Cancer, COPD, Depression, Diabetes, Drug abuse, Early death, Hearing loss, Heart disease, Hyperlipidemia, Hypertension, Kidney disease, Learning disabilities, Mental illness, Mental retardation, Miscarriages / Stillbirths, Stroke, or Vision loss. She was adopted.    Physical Exam: Patient Vitals for the past 24 hrs:  BP Temp Temp src Pulse Resp SpO2  07/28/15 2326 (!) 90/46 mmHg - - - 21 -  07/28/15 2300 (!) 99/46 mmHg - - 94 13 97 %  07/28/15 2204 (!) 70/42 mmHg 97.6 F (36.4 C) Oral 119 22 97 %  07/28/15 2203 - - - - - 100 %  07/28/15 2202 (!) 70/42 mmHg 97.6 F (36.4 C) Oral (!) 46 22 94 %    1. General:  in No Acute distress 2. Psychological: Alert and  Oriented 3. Head/ENT:     Dry Mucous Membranes                          Head Non traumatic, neck supple                          Normal  Dentition 4. SKIN:    decreased Skin turgor,  Skin clean Dry and intact no rash 5. Heart: Regular rate and rhythm no Murmur, Rub or gallop 6. Lungs: Clear to auscultation bilaterally, no wheezes or crackles   7. Abdomen: Soft, diffusely under, Non distended, colostomy in palce 8. Lower extremities: no clubbing, cyanosis, or edema 9. Neurologically Grossly intact, moving all 4 extremities equally 10. MSK: Normal range of motion  body mass index is unknown because there is no weight on file.   Labs on  Admission:   Results for orders placed or performed during the hospital encounter of 07/28/15 (from the past 24 hour(s))  Lipase, blood     Status: None   Collection Time: 07/28/15 10:30 PM  Result Value Ref Range   Lipase 39 22 - 51 U/L  Comprehensive metabolic panel     Status: Abnormal   Collection Time: 07/28/15 10:30 PM  Result Value Ref Range   Sodium 127 (L) 135 - 145 mmol/L   Potassium 3.5 3.5 - 5.1 mmol/L   Chloride 93 (L) 101 - 111 mmol/L   CO2 16 (L) 22 - 32 mmol/L   Glucose, Bld 118 (H) 65 - 99 mg/dL   BUN 48 (H) 6 - 20 mg/dL   Creatinine, Ser 4.95 (H) 0.44 - 1.00 mg/dL   Calcium 9.9 8.9 - 10.3 mg/dL   Total Protein 10.5 (H) 6.5 - 8.1 g/dL   Albumin 5.8 (H) 3.5 - 5.0 g/dL   AST 29 15 - 41 U/L   ALT 24 14 - 54 U/L   Alkaline Phosphatase 109 38 - 126 U/L   Total Bilirubin 0.8 0.3 - 1.2 mg/dL   GFR calc non Af Amer 11 (L) >60 mL/min   GFR calc Af Amer 13 (L) >60 mL/min   Anion gap 18 (H) 5 - 15  CBC     Status: Abnormal   Collection Time: 07/28/15 10:30 PM  Result Value Ref Range   WBC 21.0 (H) 4.0 - 10.5 K/uL   RBC 5.37 (H) 3.87 - 5.11 MIL/uL   Hemoglobin 15.2 (H) 12.0 - 15.0 g/dL   HCT 43.9 36.0 - 46.0 %   MCV 81.8 78.0 - 100.0 fL   MCH 28.3 26.0 - 34.0 pg   MCHC 34.6 30.0 - 36.0 g/dL   RDW 13.7 11.5 - 15.5 %   Platelets 566 (H) 150 - 400 K/uL    UA odered  No results found for: HGBA1C  CrCl cannot be calculated (Unknown ideal weight.).  BNP (last 3 results) No results for input(s): PROBNP in the last 8760 hours.  Other results:  I have pearsonaly reviewed this: ECG REPORT  Rate: 104  Rhythm: ST ST&T Change: no ischemic changes QTC 419   There were no vitals filed for this visit.   Cultures:    Component Value Date/Time   SDES  URINE, RANDOM 04/30/2015 0811   SPECREQUEST NONE 04/30/2015 0811   CULT  04/30/2015 0811    KLEBSIELLA PNEUMONIAE Performed at [facility] 05/03/2015 FINAL 04/30/2015 0811       Radiological Exams on Admission: Dg Chest 2 View  07/28/2015   CLINICAL DATA:  Cough, vomiting.  History of colon cancer.  EXAM: CHEST  2 VIEW  COMPARISON:  Chest CT 05/13/2014  FINDINGS: The heart size and mediastinal contours are within normal limits. Both lungs are clear. The visualized skeletal structures are unremarkable.  IMPRESSION: No active cardiopulmonary disease.   Electronically Signed   By: Conchita Paris M.D.   On: 07/28/2015 23:37    Chart has been reviewed  Family not  at  Bedside  Best friend in the room  Assessment/Plan  24 year old female with history of colon cancer status post colectomy and colostomy with known severe malnutrition and history of high ostomy output with recurrent dehydration admitted with acute renal failure with creatinine up to 4 hypotension and leukocytosis secondary to dehydration although sepsis could not be ruled out  Present on Admission:  . Acute renal failure likely secondary to dehydration will admit for fluid resuscitation admit to step down obtain urine electrolytes if creatinine does not improve will need nephrology consult  . Dehydration most likely secondary to nausea and vomiting patient denies increased ostomy output but he'll need to follow-up and monitor  . Gastroenteritis-likely viral patient thinks that this could be food poisoning but this possibility of gastroenteritis.   Obtain CT scan of the abdomen. Given leukocytosis and hypotension for now cover as Zosyn  . Hypokalemia - will need gentle replacement repeat potassium in the morning. Currently mildly down to 3.5  . Hyponatremia - most likely secondary to dehydration will obtain urine electrolytes  . Metabolic acidosis with elevated anion gap most likely secondary to renal failure will follow-up,  repeat blood work obtain lactic acid  . hypotension most likely secondary to dehydration responding to IV fluids. Sepsis could not be ruled out given elevated white blood cell,  tachycardia and hypotension.  Will obtain blood cultures and initiate antibiotics monitor and step down   Prophylaxis:    Lovenox   CODE STATUS:  FULL CODE as per patient    Disposition:  To home once workup is complete and patient is stable  Other plan as per orders.  I have spent a total of 65 min on this admission case discussed with PCCM  Star 07/29/2015, 12:40 AM  [facility]  Pager [phone]   after 2 AM please page floor coverage PA If 7AM-7PM, please contact the day team taking care of the patient  [email or web address]  Password [email or web address]

## 2015-07-29 NOTE — Progress Notes (Signed)
ANTIBIOTIC CONSULT NOTE - INITIAL  Pharmacy Consult for zosyn Indication: Intra-abdominal infection  Allergies  Allergen Reactions  . Soap Hives and Itching    *Dove Soap*  . 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 Levaquin [Levofloxacin] Hives    Patient Measurements: Height: 5\' 1"  (154.9 cm) Weight: 100 lb 15.5 oz (45.8 kg) IBW/kg (Calculated) : 47.8 Adjusted Body Weight:   Vital Signs: Temp: 98 F (36.7 C) (09/01 0200) Temp Source: Oral (09/01 0200) BP: 86/52 mmHg (09/01 0400) Pulse Rate: 86 (09/01 0400) Intake/Output from previous day: 08/31 0701 - 09/01 0700 In: 1650 [I.V.:150; IV Piggyback:1500] Out: 250 [Urine:200; Stool:50] Intake/Output from this shift: Total I/O In: 1650 [I.V.:150; IV Piggyback:1500] Out: 250 [Urine:200; Stool:50]  Labs:  Recent Labs  07/28/15 2230  WBC 21.0*  HGB 15.2*  PLT 566*  CREATININE 4.95*   Estimated Creatinine Clearance: 12.5 mL/min (by C-G formula based on Cr of 4.95). No results for input(s): VANCOTROUGH, VANCOPEAK, VANCORANDOM, GENTTROUGH, GENTPEAK, GENTRANDOM, TOBRATROUGH, TOBRAPEAK, TOBRARND, AMIKACINPEAK, AMIKACINTROU, AMIKACIN in the last 72 hours.   Microbiology: Recent Results (from the past 720 hour(s))  MRSA PCR Screening     Status: None   Collection Time: 07/29/15  1:48 AM  Result Value Ref Range Status   MRSA by PCR NEGATIVE NEGATIVE Final    Comment:        The GeneXpert MRSA Assay (FDA approved for NASAL specimens only), is one component of a comprehensive MRSA colonization surveillance program. It is not intended to diagnose MRSA infection nor to guide or monitor treatment for MRSA infections.     Medical History: Past Medical History  Diagnosis Date  . UTI (lower urinary tract infection)   . Heart murmur   . Pneumonia 2008; 2012  . Complication of anesthesia     "I swallowed the anesthesia when appendix taken out; got pneumonia"  . Iron deficiency anemia   . History of blood transfusion 04/26/2013     "4 bags" (05/21/2013)  . Colon cancer 2014    s/p resection and ileostomy  . 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 FAP (familial adenomatous polyposis) 09/30/2014  . Nephrolithiasis 09/30/2014    Medications:  Anti-infectives    Start     Dose/Rate Route Frequency Ordered Stop   07/29/15 0215  piperacillin-tazobactam (ZOSYN) IVPB 3.375 g     3.375 g 12.5 mL/hr over 240 Minutes Intravenous 3 times per day 07/29/15 0201       Assessment: Patient with hx of colon cancer, s/p colectomy and colostomy in ED with N/V and dehydration with ARF.  MD order empiric zosyn for intra-abdominal infection. Expect renal function to improve with hydration, will dose as if CrCl > 20 at this time.  Goal of Therapy:  Zosyn based on renal function   Plan:  Follow up culture results  Zosyn 3.375g IV Q8H infused over 4hrs.  Follow up with renal function and adjust if renal function does note improve as expected.  Tyler Deis, Shea Stakes Crowford 07/29/2015,4:47 AM

## 2015-07-29 NOTE — ED Notes (Signed)
Hospitalist at bedside 

## 2015-07-29 NOTE — Progress Notes (Addendum)
Patient seen and examined. Admitted earlier today with N/V. Has a h/o familial adenomatous polyposis and has been diagnosed with colon cancer and is s/p colectomy and colostomy. Remains hypotensive from 60s-low 100s. No evidence for sepsis. DC zosyn. Also with ARF that is improving with IVF. Recheck renal function in am. WIll keep in SDU unit. Continue IVF. Will advance diet. Continue to follow.  Domingo Mend, MD [facility] Pager: [phone]

## 2015-07-30 ENCOUNTER — Inpatient Hospital Stay (HOSPITAL_COMMUNITY): Payer: 59

## 2015-07-30 LAB — BASIC METABOLIC PANEL
ANION GAP: 9 (ref 5–15)
BUN: 17 mg/dL (ref 6–20)
CALCIUM: 7.5 mg/dL — AB (ref 8.9–10.3)
CO2: 12 mmol/L — ABNORMAL LOW (ref 22–32)
Chloride: 119 mmol/L — ABNORMAL HIGH (ref 101–111)
Creatinine, Ser: 1.61 mg/dL — ABNORMAL HIGH (ref 0.44–1.00)
GFR, EST AFRICAN AMERICAN: 50 mL/min — AB (ref >60)
GFR, EST NON AFRICAN AMERICAN: 43 mL/min — AB (ref >60)
Glucose, Bld: 106 mg/dL — ABNORMAL HIGH (ref 65–99)
Potassium: 2.7 mmol/L — CL (ref 3.5–5.1)
SODIUM: 140 mmol/L (ref 135–145)

## 2015-07-30 LAB — CBC
HCT: 29.9 % — ABNORMAL LOW (ref 36.0–46.0)
HEMOGLOBIN: 9.9 g/dL — AB (ref 12.0–15.0)
MCH: 28.2 pg (ref 26.0–34.0)
MCHC: 33.1 g/dL (ref 30.0–36.0)
MCV: 85.2 fL (ref 78.0–100.0)
Platelets: 297 10*3/uL (ref 150–400)
RBC: 3.51 MIL/uL — AB (ref 3.87–5.11)
RDW: 14.1 % (ref 11.5–15.5)
WBC: 7.1 10*3/uL (ref 4.0–10.5)

## 2015-07-30 LAB — URINE CULTURE: CULTURE: NO GROWTH

## 2015-07-30 LAB — CALCIUM, IONIZED: Calcium, Ionized, Serum: 4.1 mg/dL — ABNORMAL LOW (ref 4.5–5.6)

## 2015-07-30 MED ORDER — POTASSIUM CHLORIDE CRYS ER 20 MEQ PO TBCR
40.0000 meq | EXTENDED_RELEASE_TABLET | Freq: Two times a day (BID) | ORAL | Status: AC
  Administered 2015-07-30 (×2): 40 meq via ORAL
  Filled 2015-07-30 (×2): qty 2

## 2015-07-30 MED ORDER — POTASSIUM CHLORIDE 10 MEQ/100ML IV SOLN: 10.0000 meq | INTRAVENOUS | Status: DC

## 2015-07-30 MED ORDER — POTASSIUM CHLORIDE CRYS ER 20 MEQ PO TBCR: 40.0000 meq | EXTENDED_RELEASE_TABLET | Freq: Two times a day (BID) | ORAL | Status: DC

## 2015-07-30 NOTE — Progress Notes (Signed)
Patient called nurse and she wanted to talk to Willard.Text message send to Md.Patient was very upset because she said she wanted to go home but Md wants to keep her here for another day. Patient is upset because she was supposed to get potassium and is not getting it,explained to patient that it is due again at 2200, and that she is getting hydration via IV.

## 2015-07-30 NOTE — Progress Notes (Signed)
CRITICAL VALUE ALERT  Critical value received:  K+  Date of notification:  07/30/15  Time of notification:  05:44  Critical value read back:Yes.    Nurse who received alert:  Lenox Ahr  MD notified (1st page):  [facility] on call  Time of first page:  05:45  MD notified (2nd page): N/A  Time of second page: N/A   Responding MD:  Kathline Magic  Time MD responded:  05:45

## 2015-07-30 NOTE — Progress Notes (Signed)
Report called to [facility].  Patient transported wheelchair to room 1325.

## 2015-07-30 NOTE — Progress Notes (Signed)
[facility] PROGRESS NOTE  MAILINH MONTEZUMA [id number] DOB: February 28, 1991 DOA: 07/28/2015 PCP: Vidal Schwalbe, MD  Assessment/Plan: Abdominal pain/N/V -Suspect food poisoning vs viral gastroenteritis given rapid improvement. -Continue supportive management with antiemetics and pain meds as needed.  Acute Renal Failure -Secondary to GI losses. -Drastic improvement with IVF. Cr is 1.61 9/1 down from 4.95 on admission.  Hypokalemia -K is still 2.7 today. -Secondary to dehydration and GI losses. -Replete orally.  Hyponatremia -Resolved with IVF.  Metabolic Acidosis -Still with significant acidosis, altho improved from admission. -Continue to follow as renal function improves.  Hypotension -Secondary to severe dehydration with GI losses. -Also has a component of chronic hypotension, states her SBP is usually in the 90s.  Pelvic Masses -Appear loculated and posterior to the uterus on CT scan. -Per radiologist recommendations, will order pelvic US.  Code Status: Full Code Family Communication: Patient only  Disposition Plan: Keep at least one more day, can transfer to floor.   Consultants:  None   Antibiotics:  None   Subjective: Feels better. Wants to go home as she is missing class.  Objective: Filed Vitals:   07/30/15 0533 07/30/15 0600 07/30/15 0700 07/30/15 0800  BP: 87/45     Pulse: 75 77 74   Temp:    97.6 F (36.4 C)  TempSrc:    Oral  Resp: 18 15 20    Height:      Weight:      SpO2: 100% 100% 100%     Intake/Output Summary (Last 24 hours) at 07/30/15 0823 Last data filed at 07/30/15 0600  Gross per 24 hour  Intake   5020 ml  Output   4325 ml  Net    695 ml   Filed Weights   07/29/15 0145  Weight: 45.8 kg (100 lb 15.5 oz)    Exam:   General:  AA Ox3  Cardiovascular: RRR  Respiratory: CTA B  Abdomen: S/NT/ND/+BS/colostomy in place, moderate output  Extremities: no C/C/E   Neurologic:  Non-focal  Data  Reviewed: Basic Metabolic Panel:  Recent Labs Lab 07/28/15 2230 07/29/15 0120 07/29/15 0455 07/29/15 0932 07/30/15 0448  NA 127*  --  132* 135 140  K 3.5  --  3.6 3.3* 2.7*  CL 93*  --  111 116* 119*  CO2 16*  --  9* 13* 12*  GLUCOSE 118*  --  96 93 106*  BUN 48*  --  34* 32* 17  CREATININE 4.95*  --  3.26* 2.82* 1.61*  CALCIUM 9.9  --  7.2* 7.4* 7.5*  MG  --  1.8 1.7  --   --   PHOS  --  6.7* 5.6*  --   --    Liver Function Tests:  Recent Labs Lab 07/28/15 2230 07/29/15 0455  AST 29 19  ALT 24 17  ALKPHOS 109 66  BILITOT 0.8 0.4  PROT 10.5* 6.1*  ALBUMIN 5.8* 3.3*    Recent Labs Lab 07/28/15 2230  LIPASE 39   No results for input(s): AMMONIA in the last 168 hours. CBC:  Recent Labs Lab 07/28/15 2230 07/29/15 0455 07/30/15 0448  WBC 21.0* 15.4* 7.1  NEUTROABS  --  12.1*  --   HGB 15.2* 10.4* 9.9*  HCT 43.9 30.7* 29.9*  MCV 81.8 83.2 85.2  PLT 566* 306 297   Cardiac Enzymes: No results for input(s): CKTOTAL, CKMB, CKMBINDEX, TROPONINI in the last 168 hours. BNP (last 3 results) No results for input(s): BNP in the last 8760  hours.  ProBNP (last 3 results) No results for input(s): PROBNP in the last 8760 hours.  CBG: No results for input(s): GLUCAP in the last 168 hours.  Recent Results (from the past 240 hour(s))  MRSA PCR Screening     Status: None   Collection Time: 07/29/15  1:48 AM  Result Value Ref Range Status   MRSA by PCR NEGATIVE NEGATIVE Final    Comment:        The GeneXpert MRSA Assay (FDA approved for NASAL specimens only), is one component of a comprehensive MRSA colonization surveillance program. It is not intended to diagnose MRSA infection nor to guide or monitor treatment for MRSA infections.   Culture, Urine     Status: None   Collection Time: 07/29/15  3:20 AM  Result Value Ref Range Status   Specimen Description URINE, CLEAN CATCH  Final   Special Requests NONE  Final   Culture   Final    NO GROWTH 1  DAY Performed at [facility]    Report Status 07/30/2015 FINAL  Final     Studies: Ct Abdomen Pelvis Wo Contrast  07/29/2015   CLINICAL DATA:  Abdominal pain and vomiting.  Hypotensive.  EXAM: CT ABDOMEN AND PELVIS WITHOUT CONTRAST  TECHNIQUE: Multidetector CT imaging of the abdomen and pelvis was performed following the standard protocol without IV contrast.  COMPARISON:  09/07/2014  FINDINGS: There is a 7 x 10 mm right midpole collecting system calculus. There is no ureteral calculus. There is no hydronephrosis. The kidneys are otherwise unremarkable. There are unremarkable unenhanced appearances of the liver, gallbladder, spleen, pancreas and adrenals.  The stomach and small bowel appear unremarkable. There are unremarkable appearances of the right lower quadrant colostomy.  There is no evidence of bowel obstruction. There is no extraluminal air. There is no focal inflammatory change in the abdomen or pelvis.  The abdominal aorta is normal in caliber. There is no atherosclerotic calcification. There is no adenopathy in the abdomen or pelvis.  There is a small volume ascites. There is a loculated appearing pelvic fluid collection posterior to the uterus and at the right lateral aspect of the uterus, possibly surrounding the ovary although the ovaries are not optimally seen.  There is no significant abnormality in the lower chest. There is no significant musculoskeletal abnormality.  IMPRESSION: 1. Loculated appearing pelvic fluid collections posterior and to the right of the uterus. Consider pelvic sonography for characterization. Relationship of the fluid collections to the ovaries is inconclusive on this study. 2. Right nephrolithiasis. 3. Negative for bowel obstruction or perforation. No focal inflammatory changes are evident. 4. Unremarkable appearances of the right lower quadrant colostomy.   Electronically Signed   By: Andreas Newport M.D.   On: 07/29/2015 05:03   Dg Chest 2  View  07/28/2015   CLINICAL DATA:  Cough, vomiting.  History of colon cancer.  EXAM: CHEST  2 VIEW  COMPARISON:  Chest CT 05/13/2014  FINDINGS: The heart size and mediastinal contours are within normal limits. Both lungs are clear. The visualized skeletal structures are unremarkable.  IMPRESSION: No active cardiopulmonary disease.   Electronically Signed   By: Conchita Paris M.D.   On: 07/28/2015 23:37    Scheduled Meds: . enoxaparin (LOVENOX) injection  30 mg Subcutaneous Q24H  . potassium chloride  40 mEq Oral BID  . sodium chloride  3 mL Intravenous Q12H   Continuous Infusions: . sodium chloride 2,000 mL (07/30/15 0538)    Active Problems:  Hypokalemia   Nausea with vomiting   Acute renal failure   Dehydration   Hyponatremia   Metabolic acidosis   FAP (familial adenomatous polyposis)   Gastroenteritis-likely viral   ARF (acute renal failure)   Arterial hypotension    Time spent: 25 minutes. Greater than 50% of this time was spent in direct contact with the patient coordinating care.    Lelon Frohlich  [facility] Pager [phone]  If 7PM-7AM, please contact night-coverage at [email or web address], password [facility] 07/30/2015, 8:23 AM  LOS: 1 day

## 2015-07-31 DIAGNOSIS — E871 Hypo-osmolality and hyponatremia: Secondary | ICD-10-CM

## 2015-07-31 DIAGNOSIS — E872 Acidosis: Secondary | ICD-10-CM

## 2015-07-31 DIAGNOSIS — K529 Noninfective gastroenteritis and colitis, unspecified: Secondary | ICD-10-CM

## 2015-07-31 DIAGNOSIS — N189 Chronic kidney disease, unspecified: Secondary | ICD-10-CM

## 2015-07-31 DIAGNOSIS — Z932 Ileostomy status: Secondary | ICD-10-CM

## 2015-07-31 DIAGNOSIS — R1110 Vomiting, unspecified: Secondary | ICD-10-CM

## 2015-07-31 DIAGNOSIS — R198 Other specified symptoms and signs involving the digestive system and abdomen: Secondary | ICD-10-CM

## 2015-07-31 DIAGNOSIS — D649 Anemia, unspecified: Secondary | ICD-10-CM

## 2015-07-31 DIAGNOSIS — I9589 Other hypotension: Secondary | ICD-10-CM

## 2015-07-31 DIAGNOSIS — N179 Acute kidney failure, unspecified: Secondary | ICD-10-CM

## 2015-07-31 DIAGNOSIS — E860 Dehydration: Secondary | ICD-10-CM

## 2015-07-31 DIAGNOSIS — D126 Benign neoplasm of colon, unspecified: Secondary | ICD-10-CM

## 2015-07-31 DIAGNOSIS — E876 Hypokalemia: Secondary | ICD-10-CM

## 2015-07-31 LAB — BASIC METABOLIC PANEL
Anion gap: 4 — ABNORMAL LOW (ref 5–15)
BUN: 9 mg/dL (ref 6–20)
CHLORIDE: 116 mmol/L — AB (ref 101–111)
CO2: 13 mmol/L — AB (ref 22–32)
CREATININE: 1.13 mg/dL — AB (ref 0.44–1.00)
Calcium: 7 mg/dL — ABNORMAL LOW (ref 8.9–10.3)
GFR calc Af Amer: >60 mL/min (ref >60)
GFR calc non Af Amer: >60 mL/min (ref >60)
Glucose, Bld: 98 mg/dL (ref 65–99)
Potassium: 2.4 mmol/L — CL (ref 3.5–5.1)
Sodium: 133 mmol/L — ABNORMAL LOW (ref 135–145)

## 2015-07-31 LAB — CBC
HCT: 26.1 % — ABNORMAL LOW (ref 36.0–46.0)
Hemoglobin: 8.4 g/dL — ABNORMAL LOW (ref 12.0–15.0)
MCH: 27.1 pg (ref 26.0–34.0)
MCHC: 32.2 g/dL (ref 30.0–36.0)
MCV: 84.2 fL (ref 78.0–100.0)
PLATELETS: 235 10*3/uL (ref 150–400)
RBC: 3.1 MIL/uL — AB (ref 3.87–5.11)
RDW: 14.2 % (ref 11.5–15.5)
WBC: 5.7 10*3/uL (ref 4.0–10.5)

## 2015-07-31 LAB — PHOSPHORUS: Phosphorus: 2.3 mg/dL — ABNORMAL LOW (ref 2.5–4.6)

## 2015-07-31 LAB — MAGNESIUM: Magnesium: 1.2 mg/dL — ABNORMAL LOW (ref 1.7–2.4)

## 2015-07-31 LAB — PROCALCITONIN: Procalcitonin: <0.1 ng/mL

## 2015-07-31 MED ORDER — MAGNESIUM SULFATE 4 GM/100ML IV SOLN
4.0000 g | Freq: Once | INTRAVENOUS | Status: DC
  Filled 2015-07-31: qty 100

## 2015-07-31 MED ORDER — POTASSIUM CHLORIDE CRYS ER 20 MEQ PO TBCR: 40.0000 meq | EXTENDED_RELEASE_TABLET | Freq: Once | ORAL | Status: DC

## 2015-07-31 MED ORDER — POTASSIUM CHLORIDE 10 MEQ/100ML IV SOLN
10.0000 meq | INTRAVENOUS | Status: AC
  Administered 2015-07-31 (×2): 10 meq via INTRAVENOUS
  Filled 2015-07-31 (×4): qty 100

## 2015-07-31 NOTE — Discharge Summary (Signed)
Physician Discharge Summary  Cindy [name] [id number] DOB: 21-Apr-1991 DOA: 07/28/2015  PCP: Vidal Schwalbe, MD  Admit date: 07/28/2015 Discharge date: 07/31/2015   Recommendations for Outpatient Follow-Up:   1. The patient left AGAINST MEDICAL ADVICE despite RN explaining to her that her electrolytes were not yet normal enough for discharge.   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 Left AGAINST MEDICAL ADVICE.  Discharge Condition: Improved.  Diet recommendation: Regular.   History of Present Illness:   24 year old female with a PMH of colon cancer secondary to FAP status post resection and ileostomy with recurrent problems with high output ileostomy resulting in hospitalizations for treatment of acute renal failure and electrolyte derangements who was admitted 07/28/15 with nausea, vomiting, and high output from her ileostomy causing electrolyte derangements in acute renal failure.   Hospital Course by Problem:   Principal Problem:   High output ileostomy possibly from acute viral gastroenteritis with nausea and vomiting/dehydration - Rapid resolution of symptoms noted however the patient continued to have problems with electrolyte derangements. - Nausea/vomiting resolved with restoration of hydration and treatment with antinausea medications.  Active Problems:   Hypokalemia/hypomagnesemia - Was being repleted when patient left AMA after pulling her IV out.    Acute renal failure - Improved with hydration, however patient pulled out IV and left  AMA.    Hyponatremia - Likely secondary to dehydration. Resolved with IV fluids.    Metabolic acidosis - Improved but still acidotic when she left AMA.    FAP (familial adenomatous polyposis)/colon cancer status post ileostomy    Arterial hypotension - Blood pressure still on the low side.  - Encouraged to remain in the hospital for IV fluids and electrolytes however the patient left AMA.    Normocytic anemia - No current indication for transfusion.   Medical Consultants:    None.   Discharge Exam:   Filed Vitals:   07/31/15 0547  BP: 84/41  Pulse: 84  Temp: 98.2 F (36.8 C)  Resp: 15   Filed Vitals:   07/30/15 0900 07/30/15 1111 07/30/15 2106 07/31/15 0547  BP:  88/57 92/45 84/41   Pulse: 93 79 79 84  Temp:  97.8 F (36.6 C) 98.4 F (36.9 C) 98.2 F (36.8 C)  TempSrc:  Oral Oral Oral  Resp: 16 18 17 15   Height:      Weight:      SpO2: 100% 100% 99% 99%    Unable to do physical exam as patient left AMA.   The results of significant diagnostics from this hospitalization (including imaging, microbiology, ancillary and laboratory) are listed below for reference.     Procedures and Diagnostic Studies:   Ct Abdomen Pelvis Wo Contrast  07/29/2015   CLINICAL DATA:  Abdominal pain and vomiting.  Hypotensive.  EXAM: CT ABDOMEN AND PELVIS WITHOUT CONTRAST  TECHNIQUE: Multidetector CT imaging of the abdomen and pelvis was performed following the standard protocol without IV contrast.  COMPARISON:  09/07/2014  FINDINGS: There is a 7 x 10 mm right midpole collecting system calculus.  There is no ureteral calculus. There is no hydronephrosis. The kidneys are otherwise unremarkable. There are unremarkable unenhanced appearances of the liver, gallbladder, spleen, pancreas and adrenals.  The stomach and small bowel appear unremarkable. There are unremarkable appearances of the right lower quadrant colostomy.  There is no evidence of bowel obstruction. There is no extraluminal  air. There is no focal inflammatory change in the abdomen or pelvis.  The abdominal aorta is normal in caliber. There is no atherosclerotic calcification. There is no adenopathy in the abdomen or pelvis.  There is a small volume ascites. There is a loculated appearing pelvic fluid collection posterior to the uterus and at the right lateral aspect of the uterus, possibly surrounding the ovary although the ovaries are not optimally seen.  There is no significant abnormality in the lower chest. There is no significant musculoskeletal abnormality.  IMPRESSION: 1. Loculated appearing pelvic fluid collections posterior and to the right of the uterus. Consider pelvic sonography for characterization. Relationship of the fluid collections to the ovaries is inconclusive on this study. 2. Right nephrolithiasis. 3. Negative for bowel obstruction or perforation. No focal inflammatory changes are evident. 4. Unremarkable appearances of the right lower quadrant colostomy.   Electronically Signed   By: Andreas Newport M.D.   On: 07/29/2015 05:03   Dg Chest 2 View  07/28/2015   CLINICAL DATA:  Cough, vomiting.  History of colon cancer.  EXAM: CHEST  2 VIEW  COMPARISON:  Chest CT 05/13/2014  FINDINGS: The heart size and mediastinal contours are within normal limits. Both lungs are clear. The visualized skeletal structures are unremarkable.  IMPRESSION: No active cardiopulmonary disease.   Electronically Signed   By: Conchita Paris M.D.   On: 07/28/2015 23:37   US Transvaginal Non-ob  07/30/2015   CLINICAL DATA:  Patient with pelvic fluid collection on CT. Further evaluate.  EXAM: TRANSABDOMINAL AND TRANSVAGINAL ULTRASOUND OF PELVIS  TECHNIQUE: Both transabdominal and transvaginal ultrasound examinations of the pelvis were performed. Transabdominal technique was performed for global imaging of the pelvis including uterus, ovaries, adnexal regions, and pelvic cul-de-sac. It was necessary to proceed with endovaginal exam  following the transabdominal exam to visualize the endometrium and adnexal structures.  COMPARISON:  CT 07/29/2015  FINDINGS: Uterus  Measurements: 8.2 x 3.7 x 4.3 cm. No fibroids or other mass visualized.  Endometrium  Thickness: 5 mm.  No focal abnormality visualized.  Right ovary  Measurements: 4.8 x 3.1 x 3.3 cm. No definite color Doppler flow demonstrated within the right ovary. The right ovary is heterogeneous in echogenicity and demonstrates an abnormal sonographic appearance.  Left ovary  Not visualized.  Other findings  There is a moderate amount of free fluid demonstrated within the pelvis.  IMPRESSION: The right ovary is heterogeneous in echogenicity and demonstrates an abnormal sonographic appearance. No definite color flow demonstrated on color Doppler imaging involving the right ovary. This is a nonspecific finding however recommend clinical correlation for right lower quadrant pain as torsion is not excluded.  Moderate amount of free fluid in the pelvis.  These results will be called to the ordering clinician or representative by the Radiologist Assistant, and communication documented in the PACS or zVision Dashboard.   Electronically Signed   By: Lovey Newcomer M.D.   On: 07/30/2015 13:03   US Pelvis Complete  07/30/2015   CLINICAL DATA:  Patient with pelvic fluid collection on CT. Further evaluate.  EXAM: TRANSABDOMINAL AND TRANSVAGINAL ULTRASOUND OF PELVIS  TECHNIQUE: Both transabdominal and transvaginal ultrasound  examinations of the pelvis were performed. Transabdominal technique was performed for global imaging of the pelvis including uterus, ovaries, adnexal regions, and pelvic cul-de-sac. It was necessary to proceed with endovaginal exam following the transabdominal exam to visualize the endometrium and adnexal structures.  COMPARISON:  CT 07/29/2015  FINDINGS: Uterus  Measurements: 8.2 x 3.7 x 4.3 cm. No fibroids or other mass visualized.  Endometrium  Thickness: 5 mm.  No focal abnormality  visualized.  Right ovary  Measurements: 4.8 x 3.1 x 3.3 cm. No definite color Doppler flow demonstrated within the right ovary. The right ovary is heterogeneous in echogenicity and demonstrates an abnormal sonographic appearance.  Left ovary  Not visualized.  Other findings  There is a moderate amount of free fluid demonstrated within the pelvis.  IMPRESSION: The right ovary is heterogeneous in echogenicity and demonstrates an abnormal sonographic appearance. No definite color flow demonstrated on color Doppler imaging involving the right ovary. This is a nonspecific finding however recommend clinical correlation for right lower quadrant pain as torsion is not excluded.  Moderate amount of free fluid in the pelvis.  These results will be called to the ordering clinician or representative by the Radiologist Assistant, and communication documented in the PACS or zVision Dashboard.   Electronically Signed   By: Lovey Newcomer M.D.   On: 07/30/2015 13:03     Labs:   Basic Metabolic Panel:  Recent Labs Lab 07/28/15 2230 07/29/15 0120 07/29/15 0455 07/29/15 0932 07/30/15 0448 07/31/15 0432  NA 127*  --  132* 135 140 133*  K 3.5  --  3.6 3.3* 2.7* 2.4*  CL 93*  --  111 116* 119* 116*  CO2 16*  --  9* 13* 12* 13*  GLUCOSE 118*  --  96 93 106* 98  BUN 48*  --  34* 32* 17 9  CREATININE 4.95*  --  3.26* 2.82* 1.61* 1.13*  CALCIUM 9.9  --  7.2* 7.4* 7.5* 7.0*  MG  --  1.8 1.7  --   --  1.2*  PHOS  --  6.7* 5.6*  --   --  2.3*   GFR Estimated Creatinine Clearance: 54.5 mL/min (by C-G formula based on Cr of 1.13). Liver Function Tests:  Recent Labs Lab 07/28/15 2230 07/29/15 0455  AST 29 19  ALT 24 17  ALKPHOS 109 66  BILITOT 0.8 0.4  PROT 10.5* 6.1*  ALBUMIN 5.8* 3.3*    Recent Labs Lab 07/28/15 2230  LIPASE 39   Coagulation profile  Recent Labs Lab 07/29/15 0455  INR 1.08    CBC:  Recent Labs Lab 07/28/15 2230 07/29/15 0455 07/30/15 0448 07/31/15 0432  WBC 21.0* 15.4*  7.1 5.7  NEUTROABS  --  12.1*  --   --   HGB 15.2* 10.4* 9.9* 8.4*  HCT 43.9 30.7* 29.9* 26.1*  MCV 81.8 83.2 85.2 84.2  PLT 566* 306 297 235   Thyroid function studies  Recent Labs  07/29/15 0455  TSH 0.639   Microbiology Recent Results (from the past 240 hour(s))  MRSA PCR Screening     Status: None   Collection Time: 07/29/15  1:48 AM  Result Value Ref Range Status   MRSA by PCR NEGATIVE NEGATIVE Final    Comment:        The GeneXpert MRSA Assay (FDA approved for NASAL specimens only), is one component of a comprehensive MRSA colonization surveillance program. It is not intended to diagnose MRSA infection nor to guide or monitor treatment for  MRSA infections.   Culture, Urine     Status: None   Collection Time: 07/29/15  3:20 AM  Result Value Ref Range Status   Specimen Description URINE, CLEAN CATCH  Final   Special Requests NONE  Final   Culture   Final    NO GROWTH 1 DAY Performed at [facility]    Report Status 07/30/2015 FINAL  Final  Culture, blood (x 2)     Status: None (Preliminary result)   Collection Time: 07/29/15  4:50 AM  Result Value Ref Range Status   Specimen Description BLOOD LEFT ANTECUBITAL  Final   Special Requests IN PEDIATRIC BOTTLE 3ML  Final   Culture   Final    NO GROWTH 2 DAYS Performed at [facility]    Report Status PENDING  Incomplete  Culture, blood (x 2)     Status: None (Preliminary result)   Collection Time: 07/29/15  4:55 AM  Result Value Ref Range Status   Specimen Description BLOOD LEFT HAND  Final   Special Requests IN PEDIATRIC BOTTLE 3ML  Final   Culture   Final    NO GROWTH 2 DAYS Performed at [facility]    Report Status PENDING  Incomplete     Discharge Instructions:       Discharge Instructions    Diet general    Complete by:  As directed      Increase activity slowly    Complete by:  As directed             Medication List    STOP taking these medications         ampicillin 250 MG capsule  Commonly known as:  PRINCIPEN     Loperamide HCl 1 MG/7.5ML Liqd  Commonly known as:  IMODIUM A-D      TAKE these medications        citric acid-potassium citrate 1100-334 MG/5ML solution  Commonly known as:  POLYCITRA  Take 30 mLs by mouth 4 (four) times daily - after meals and at bedtime.     diphenoxylate-atropine 2.5-0.025 MG/5ML liquid  Commonly known as:  LOMOTIL  Take 5 mLs by mouth 4 (four) times daily as needed.     EPINEPHrine 0.3 mg/0.3 mL Soaj injection  Commonly known as:  EPI-PEN  Inject 0.3 mg into the muscle as needed (allergic reaction.).     lactose free nutrition Liqd  Take 237 mLs by mouth 2 (two) times daily between meals.     feeding supplement Liqd  Take 1 Container by mouth 3 (three) times daily between meals.     magnesium oxide 400 MG tablet  Commonly known as:  MAG-OX  Take 400 mg by mouth 2 (two) times daily.     ondansetron 8 MG disintegrating tablet  Commonly known as:  ZOFRAN ODT  Take 1 tablet (8 mg total) by mouth every 8 (eight) hours as needed for nausea or vomiting.     ONE DAILY PLUS IRON Tabs  Take 1 tablet by mouth daily.     potassium chloride 10 MEQ tablet  Commonly known as:  K-DUR  Take 3 tabs daily.     promethazine 12.5 MG tablet  Commonly known as:  PHENERGAN  Take 1 tablet (12.5 mg total) by mouth every 6 (six) hours as needed for nausea or vomiting.         Signed:  Mashanda Ishibashi  Pager [phone] [facility] 07/31/2015, 2:40 PM

## 2015-07-31 NOTE — Progress Notes (Addendum)
Patient demanding to go home, per MD pt not medically stable for d/c due to electrolyte imbalance.  Currently ordered potassium IV, PO and magnesium IV.  Patient angry demanding to leave, states had an agreement with MD yesterday that she would go today.  RN explained need for more potassium, however patient states " I was here all g-d day yesterday and now yall are saying I need f-ing potassium, i am not about to put up with this. Get the doctor in here now."  RN explained MD currently in the ICU seeing pts and will come after that.  Patient not willing to wait.  RN explained that pt cannot leave at this time unless it is AMA.  Pt stated "give me that paper and take out this IV".  Patient signed AMA form and IV was removed per request.  Of note only first two IV potassium runs were administered.   Of note, when RN walked down hallway to notify pt of conversation with MD, RN found pts IV pump in hallway, disconnected from pt, fluids still running and tubing on the floor. It appears patient disconnected her own IV without assistance from staff.

## 2015-08-03 LAB — CULTURE, BLOOD (ROUTINE X 2)
Culture: NO GROWTH
Culture: NO GROWTH

## 2016-01-10 ENCOUNTER — Emergency Department (HOSPITAL_BASED_OUTPATIENT_CLINIC_OR_DEPARTMENT_OTHER): Payer: Medicaid Other

## 2016-01-10 ENCOUNTER — Encounter (HOSPITAL_BASED_OUTPATIENT_CLINIC_OR_DEPARTMENT_OTHER): Payer: Self-pay | Admitting: *Deleted

## 2016-01-10 ENCOUNTER — Emergency Department (HOSPITAL_BASED_OUTPATIENT_CLINIC_OR_DEPARTMENT_OTHER)
Admission: EM | Admit: 2016-01-10 | Discharge: 2016-01-11 | Disposition: A | Discharge status: Home / Self Care | Payer: Medicaid Other | Attending: Emergency Medicine | Admitting: Emergency Medicine

## 2016-01-10 ENCOUNTER — Emergency Department (HOSPITAL_BASED_OUTPATIENT_CLINIC_OR_DEPARTMENT_OTHER)
Admission: EM | Admit: 2016-01-10 | Discharge: 2016-01-10 | Discharge status: Absent without leave | Payer: Medicaid Other

## 2016-01-10 DIAGNOSIS — N200 Calculus of kidney: Secondary | ICD-10-CM | POA: Diagnosis not present

## 2016-01-10 DIAGNOSIS — Z3202 Encounter for pregnancy test, result negative: Secondary | ICD-10-CM | POA: Diagnosis not present

## 2016-01-10 DIAGNOSIS — R109 Unspecified abdominal pain: Secondary | ICD-10-CM

## 2016-01-10 DIAGNOSIS — Z85038 Personal history of other malignant neoplasm of large intestine: Secondary | ICD-10-CM | POA: Diagnosis not present

## 2016-01-10 LAB — HCG, QUANTITATIVE, PREGNANCY: hCG, Beta Chain, Quant, S: 1 m[IU]/mL (ref <5)

## 2016-01-10 LAB — URINE MICROSCOPIC-ADD ON

## 2016-01-10 LAB — COMPREHENSIVE METABOLIC PANEL
ALBUMIN: 5.4 g/dL — AB (ref 3.5–5.0)
ALT: 17 U/L (ref 14–54)
ANION GAP: 16 — AB (ref 5–15)
AST: 28 U/L (ref 15–41)
Alkaline Phosphatase: 125 U/L (ref 38–126)
BILIRUBIN TOTAL: 1.2 mg/dL (ref 0.3–1.2)
BUN: 21 mg/dL — AB (ref 6–20)
CO2: 19 mmol/L — AB (ref 22–32)
Calcium: 10.1 mg/dL (ref 8.9–10.3)
Chloride: 96 mmol/L — ABNORMAL LOW (ref 101–111)
Creatinine, Ser: 2.76 mg/dL — ABNORMAL HIGH (ref 0.44–1.00)
GFR calc Af Amer: 26 mL/min — ABNORMAL LOW (ref >60)
GFR calc non Af Amer: 23 mL/min — ABNORMAL LOW (ref >60)
GLUCOSE: 106 mg/dL — AB (ref 65–99)
POTASSIUM: 3.2 mmol/L — AB (ref 3.5–5.1)
SODIUM: 131 mmol/L — AB (ref 135–145)
Total Protein: 10.2 g/dL — ABNORMAL HIGH (ref 6.5–8.1)

## 2016-01-10 LAB — CBC WITH DIFFERENTIAL/PLATELET
BASOS ABS: 0 10*3/uL (ref 0.0–0.1)
BASOS PCT: 0 %
EOS ABS: 0 10*3/uL (ref 0.0–0.7)
Eosinophils Relative: 0 %
HEMATOCRIT: 41.4 % (ref 36.0–46.0)
HEMOGLOBIN: 13.9 g/dL (ref 12.0–15.0)
Lymphocytes Relative: 7 %
Lymphs Abs: 0.9 10*3/uL (ref 0.7–4.0)
MCH: 27.1 pg (ref 26.0–34.0)
MCHC: 33.6 g/dL (ref 30.0–36.0)
MCV: 80.7 fL (ref 78.0–100.0)
MONO ABS: 0.7 10*3/uL (ref 0.1–1.0)
MONOS PCT: 5 %
NEUTROS ABS: 10.4 10*3/uL — AB (ref 1.7–7.7)
NEUTROS PCT: 88 %
Platelets: 386 10*3/uL (ref 150–400)
RBC: 5.13 MIL/uL — ABNORMAL HIGH (ref 3.87–5.11)
RDW: 12.7 % (ref 11.5–15.5)
WBC: 11.9 10*3/uL — ABNORMAL HIGH (ref 4.0–10.5)

## 2016-01-10 LAB — URINALYSIS, ROUTINE W REFLEX MICROSCOPIC
Bilirubin Urine: NEGATIVE
Glucose, UA: NEGATIVE mg/dL
Ketones, ur: NEGATIVE mg/dL
Nitrite: NEGATIVE
PH: 5.5 (ref 5.0–8.0)
Protein, ur: 100 mg/dL — AB
SPECIFIC GRAVITY, URINE: 1.015 (ref 1.005–1.030)

## 2016-01-10 LAB — LIPASE, BLOOD: Lipase: 35 U/L (ref 11–51)

## 2016-01-10 MED ORDER — ONDANSETRON HCL 4 MG/2ML IJ SOLN
4.0000 mg | INTRAMUSCULAR | Status: AC
  Administered 2016-01-10: 4 mg via INTRAVENOUS
  Filled 2016-01-10: qty 2

## 2016-01-10 MED ORDER — SODIUM CHLORIDE 0.9 % IV BOLUS (SEPSIS)
1000.0000 mL | Freq: Once | INTRAVENOUS | Status: AC
Start: 2016-01-10 — End: 2016-01-11
  Administered 2016-01-10: 1000 mL via INTRAVENOUS

## 2016-01-10 MED ORDER — SODIUM CHLORIDE 0.9 % IV BOLUS (SEPSIS)
1000.0000 mL | Freq: Once | INTRAVENOUS | Status: AC
  Administered 2016-01-10: 1000 mL via INTRAVENOUS

## 2016-01-10 MED ORDER — MORPHINE SULFATE (PF) 4 MG/ML IV SOLN
4.0000 mg | Freq: Once | INTRAVENOUS | Status: AC
  Administered 2016-01-10: 4 mg via INTRAVENOUS
  Filled 2016-01-10: qty 1

## 2016-01-10 NOTE — Progress Notes (Signed)
Charts are being merged.  Lactic acid did not cross over.  Patient's lactic acid is 2.23 mmo/L  Hard copy of patient's lactic acid give to Dr Stark Jock and Jeanie Sewer RN.

## 2016-01-10 NOTE — ED Notes (Signed)
Woke with abdominal pain. She was seen at UC this am and told her to go to the ED. She went to [facility] but left due to long wait. She went home and started vomiting. Pain never goes away. Her WBC was 13,000.

## 2016-01-10 NOTE — ED Provider Notes (Signed)
CSN: [id number]     Arrival date & time 01/10/16  2020 History   First MD Initiated Contact with Patient 01/10/16 2057     Chief Complaint  Patient presents with  . Abdominal Pain    HPI   Cindy [name] is a 25 y.o. female with a PMH of colon cancer who presents to the ED with abdominal pain, which she states started this morning and has been constant since that time. She reports movement exacerbates her pain. She has not tried anything for symptom relief. She was initially evaluated at urgent care, and told to come to the ED. She denies fever or chills. She reports nausea and vomiting. She states she has an ileostomy, which is been functioning normally. She denies dysuria, urgency, frequency.   Past Medical History  Diagnosis Date  . Cancer ([place])   . Colon cancer ([place])   . Ileostomy care [facility])    Past Surgical History  Procedure Laterality Date  . Appendectomy    . Ileostomy     No family history on file. Social History  Substance Use Topics  . Smoking status: Never Smoker   . Smokeless tobacco: None  . Alcohol Use: No   OB History    No data available      Review of Systems  Constitutional: Negative for fever and chills.  Gastrointestinal: Positive for nausea, vomiting and abdominal pain.  Genitourinary: Negative for dysuria, urgency and frequency.  All other systems reviewed and are negative.     Allergies  Orange fruit; Levaquin; and Soap  Home Medications   Prior to Admission medications   Medication Sig Start Date End Date Taking? Authorizing Provider  Ondansetron HCl (ZOFRAN PO) Take by mouth.   Yes Historical Provider, MD    BP 92/56 mmHg  Pulse 100  Temp(Src) 98 F (36.7 C) (Oral)  Resp 18  Ht 5\' 1"  (1.549 m)  Wt 47.174 kg  BMI 19.66 kg/m2  SpO2 100%  LMP 12/31/2015 Physical Exam  Constitutional: She is oriented to person, place, and time. She appears well-developed and well-nourished. No distress.  HENT:  Head: Normocephalic and  atraumatic.  Right Ear: External ear normal.  Left Ear: External ear normal.  Nose: Nose normal.  Mouth/Throat: Uvula is midline, oropharynx is clear and moist and mucous membranes are normal.  Eyes: Conjunctivae, EOM and lids are normal. Pupils are equal, round, and reactive to light. Right eye exhibits no discharge. Left eye exhibits no discharge. No scleral icterus.  Neck: Normal range of motion. Neck supple.  Cardiovascular: Normal rate, regular rhythm, normal heart sounds, intact distal pulses and normal pulses.   Pulmonary/Chest: Effort normal and breath sounds normal. No respiratory distress. She has no wheezes. She has no rales.  Abdominal: Soft. Normal appearance and bowel sounds are normal. She exhibits no distension and no mass. There is tenderness. There is no rigidity, no rebound and no guarding.  Tenderness to palpation in periumbilical region. No rebound, guarding, or masses.  Musculoskeletal: Normal range of motion. She exhibits no edema or tenderness.  Neurological: She is alert and oriented to person, place, and time. She has normal strength. No cranial nerve deficit or sensory deficit.  Skin: Skin is warm, dry and intact. No rash noted. She is not diaphoretic. No erythema. No pallor.  Psychiatric: She has a normal mood and affect. Her speech is normal and behavior is normal.  Nursing note and vitals reviewed.   ED Course  Procedures (including critical care time)  Labs  Review Labs Reviewed  CBC WITH DIFFERENTIAL/PLATELET - Abnormal; Notable for the following:    WBC 11.9 (*)    RBC 5.13 (*)    Neutro Abs 10.4 (*)    All other components within normal limits  COMPREHENSIVE METABOLIC PANEL - Abnormal; Notable for the following:    Sodium 131 (*)    Potassium 3.2 (*)    Chloride 96 (*)    CO2 19 (*)    Glucose, Bld 106 (*)    BUN 21 (*)    Creatinine, Ser 2.76 (*)    Total Protein 10.2 (*)    Albumin 5.4 (*)    GFR calc non Af Amer 23 (*)    GFR calc Af Amer 26  (*)    Anion gap 16 (*)    All other components within normal limits  URINALYSIS, ROUTINE W REFLEX MICROSCOPIC (NOT AT [facility]) - Abnormal; Notable for the following:    APPearance TURBID (*)    Hgb urine dipstick SMALL (*)    Protein, ur 100 (*)    Leukocytes, UA SMALL (*)    All other components within normal limits  URINE MICROSCOPIC-ADD ON - Abnormal; Notable for the following:    Squamous Epithelial / LPF TOO NUMEROUS TO COUNT (*)    Bacteria, UA MANY (*)    All other components within normal limits  URINE CULTURE  LIPASE, BLOOD  HCG, QUANTITATIVE, PREGNANCY  LACTIC ACID, PLASMA  I-STAT CG4 LACTIC ACID, ED    Imaging Review Ct Abdomen Pelvis Wo Contrast  01/11/2016  CLINICAL DATA:  Mid abdominal pain this morning. History of renal stones. Previous history of colon cancer post resection. EXAM: CT ABDOMEN AND PELVIS WITHOUT CONTRAST TECHNIQUE: Multidetector CT imaging of the abdomen and pelvis was performed following the standard protocol without IV contrast. COMPARISON:  None. FINDINGS: The lung bases are clear. There is a 7 mm stone in the right renal pelvis. No hydronephrosis or hydroureter. No additional renal or ureteral stones identified. Bladder is mostly decompressed. No stones identified. No bladder wall thickening. The unenhanced appearance of the liver, spleen, gallbladder, pancreas, adrenal glands, abdominal aorta, inferior vena cava, and retroperitoneal lymph nodes is unremarkable. Postoperative colectomy with right lower quadrant ileostomy. Stomach and small bowel are not abnormally distended. No bowel wall thickening is appreciated. Contrast material flows through to the colostomy without evidence of obstruction. Abdominal wall musculature appears intact. No free air or free fluid in the abdomen. Pelvis: There is a small amount of free fluid in the pelvis, likely physiologic. Fluid tracks around anterior to the uterus. This is probably free fluid but could represent hydrosalpinx.  Uterus and ovaries are not enlarged. No pelvic mass or lymphadenopathy. No destructive bone lesions. IMPRESSION: 7 mm stone in the right renal pelvis without proximal obstruction. Free fluid in the pelvis is likely physiologic. Can't exclude hydrosalpinx. Postoperative colectomy with right lower quadrant ileostomy. Electronically Signed   By: Lucienne Capers M.D.   On: 01/11/2016 01:05   I have personally reviewed and evaluated these images and lab results as part of my medical decision-making.   EKG Interpretation None      MDM   Final diagnoses:  Abdominal pain  Nephrolithiasis    25 year old female presents with abdominal pain, nausea, and vomiting. States her ileostomy has been functioning normally. Patient is afebrile. BP [id number] systolic (patient notes BP usually in 90s, 80s-90s on record review). Abdomen soft, with TTP in periumbilical region. No rebound, guarding, or masses. Given  fluids, antiemetic, and pain medication. CBC remarkable for leukocytosis of 11.9, no anemia. CMP remarkable for potassium 3.2, repleted in the ED, creatinine 2.76, which appears elevated from baseline. Lipase within normal limits. Lactic acid within normal limits. UA with small leukocytes, TNTC squamous epithelial cells, 6-30 WBC, many bacteria, likely contaminant, though urine culture ordered. HCG negative. CT abdomen pelvis reveals 7 mm stone in the right renal pelvis without proximal obstruction. Discussed findings with patient. On reassessment, she reports resolution of pain. Patient is nontoxic appearing, feel she is stable for discharge at this time. BP 0000000 systolic. Patient to follow up with PCP tomorrow for further evaluation of renal function. Return precautions discussed at length. Patient verbalizes her understanding and is in agreement with plan.  BP 92/56 mmHg  Pulse 100  Temp(Src) 98 F (36.7 C) (Oral)  Resp 18  Ht 5\' 1"  (1.549 m)  Wt 47.174 kg  BMI 19.66 kg/m2  SpO2 100%  LMP  12/31/2015     Marella Chimes, PA-C 01/11/16 0144  Veryl Speak, MD 01/12/16 [phone]

## 2016-01-11 ENCOUNTER — Encounter (HOSPITAL_COMMUNITY): Payer: Self-pay | Admitting: Internal Medicine

## 2016-01-11 LAB — I-STAT CG4 LACTIC ACID, ED
LACTIC ACID, VENOUS: 2.23 mmol/L — AB (ref 0.5–2.0)
Lactic Acid, Venous: 0.91 mmol/L (ref 0.5–2.0)

## 2016-01-11 MED ORDER — IOHEXOL 300 MG/ML  SOLN
25.0000 mL | Freq: Once | INTRAMUSCULAR | Status: AC | PRN
  Administered 2016-01-11: 25 mL via ORAL

## 2016-01-11 MED ORDER — POTASSIUM CHLORIDE CRYS ER 20 MEQ PO TBCR
EXTENDED_RELEASE_TABLET | ORAL | Status: AC
  Administered 2016-01-11: 10 meq via ORAL
  Filled 2016-01-11: qty 1

## 2016-01-11 MED ORDER — POTASSIUM CHLORIDE CRYS ER 20 MEQ PO TBCR
10.0000 meq | EXTENDED_RELEASE_TABLET | Freq: Once | ORAL | Status: AC
  Administered 2016-01-11: 10 meq via ORAL

## 2016-01-11 NOTE — ED Notes (Signed)
Pt. Just returned from CT.   Have kept  Mother informed as much as possible but only on times.

## 2016-01-11 NOTE — Discharge Instructions (Signed)
1. Medications: usual home medications 2. Treatment: rest, drink plenty of fluids 3. Follow Up: please followup with your primary doctor tomorrow for discussion of your diagnoses and further evaluation of your kidney function after today's visit; please return to the ER for high fever, severe pain, new or worsening symptoms   Kidney Stones Kidney stones (urolithiasis) are solid masses that form inside your kidneys. The intense pain is caused by the stone moving through the kidney, ureter, bladder, and urethra (urinary tract). When the stone moves, the ureter starts to spasm around the stone. The stone is usually passed in your pee (urine).  HOME CARE  Drink enough fluids to keep your pee clear or pale yellow. This helps to get the stone out.  Take a 24-hour pee (urine) sample as told by your doctor. You may need to take another sample every 6-12 months.  Strain all pee through the provided strainer. Do not pee without peeing through the strainer, not even once. If you pee the stone out, catch it in the strainer. The stone may be as small as a grain of salt. Take this to your doctor. This will help your doctor figure out what you can do to try to prevent more kidney stones.  Only take medicine as told by your doctor.  Make changes to your daily diet as told by your doctor. You may be told to:  Limit how much salt you eat.  Eat 5 or more servings of fruits and vegetables each day.  Limit how much meat, poultry, fish, and eggs you eat.  Keep all follow-up visits as told by your doctor. This is important.  Get follow-up X-rays as told by your doctor. GET HELP IF: You have pain that gets worse even if you have been taking pain medicine. GET HELP RIGHT AWAY IF:   Your pain does not get better with medicine.  You have a fever or shaking chills.  Your pain increases and gets worse over 18 hours.  You have new belly (abdominal) pain.  You feel faint or pass out.  You are unable to  pee.   This information is not intended to replace advice given to you by your health care provider. Make sure you discuss any questions you have with your health care provider.   Document Released: 05/01/2008 Document Revised: 08/04/2015 Document Reviewed: 04/16/2013 Elsevier Interactive Patient Education [employer].

## 2016-01-12 LAB — URINE CULTURE: Culture: 5000

## 2016-01-13 ENCOUNTER — Inpatient Hospital Stay (HOSPITAL_BASED_OUTPATIENT_CLINIC_OR_DEPARTMENT_OTHER)
Admission: EM | Admit: 2016-01-13 | Discharge: 2016-01-15 | DRG: 683 | Disposition: A | Discharge status: Home / Self Care | Payer: Medicaid Other | Attending: Internal Medicine | Admitting: Internal Medicine

## 2016-01-13 ENCOUNTER — Emergency Department (HOSPITAL_BASED_OUTPATIENT_CLINIC_OR_DEPARTMENT_OTHER): Payer: Medicaid Other

## 2016-01-13 ENCOUNTER — Encounter (HOSPITAL_BASED_OUTPATIENT_CLINIC_OR_DEPARTMENT_OTHER): Payer: Self-pay | Admitting: Emergency Medicine

## 2016-01-13 DIAGNOSIS — E876 Hypokalemia: Secondary | ICD-10-CM | POA: Diagnosis present

## 2016-01-13 DIAGNOSIS — R52 Pain, unspecified: Secondary | ICD-10-CM | POA: Diagnosis not present

## 2016-01-13 DIAGNOSIS — D1391 Familial adenomatous polyposis: Secondary | ICD-10-CM | POA: Diagnosis present

## 2016-01-13 DIAGNOSIS — E860 Dehydration: Secondary | ICD-10-CM | POA: Diagnosis not present

## 2016-01-13 DIAGNOSIS — I10 Essential (primary) hypertension: Secondary | ICD-10-CM | POA: Diagnosis present

## 2016-01-13 DIAGNOSIS — Z91018 Allergy to other foods: Secondary | ICD-10-CM

## 2016-01-13 DIAGNOSIS — N179 Acute kidney failure, unspecified: Secondary | ICD-10-CM

## 2016-01-13 DIAGNOSIS — Z87442 Personal history of urinary calculi: Secondary | ICD-10-CM | POA: Diagnosis not present

## 2016-01-13 DIAGNOSIS — R112 Nausea with vomiting, unspecified: Secondary | ICD-10-CM | POA: Diagnosis present

## 2016-01-13 DIAGNOSIS — Z85038 Personal history of other malignant neoplasm of large intestine: Secondary | ICD-10-CM | POA: Diagnosis not present

## 2016-01-13 DIAGNOSIS — Z8701 Personal history of pneumonia (recurrent): Secondary | ICD-10-CM | POA: Diagnosis not present

## 2016-01-13 DIAGNOSIS — Z881 Allergy status to other antibiotic agents status: Secondary | ICD-10-CM

## 2016-01-13 DIAGNOSIS — I959 Hypotension, unspecified: Secondary | ICD-10-CM | POA: Diagnosis present

## 2016-01-13 DIAGNOSIS — Z932 Ileostomy status: Secondary | ICD-10-CM | POA: Diagnosis not present

## 2016-01-13 DIAGNOSIS — E872 Acidosis: Secondary | ICD-10-CM | POA: Diagnosis present

## 2016-01-13 DIAGNOSIS — Z9109 Other allergy status, other than to drugs and biological substances: Secondary | ICD-10-CM | POA: Diagnosis not present

## 2016-01-13 DIAGNOSIS — D126 Benign neoplasm of colon, unspecified: Secondary | ICD-10-CM | POA: Diagnosis present

## 2016-01-13 DIAGNOSIS — I9589 Other hypotension: Secondary | ICD-10-CM | POA: Diagnosis not present

## 2016-01-13 DIAGNOSIS — N200 Calculus of kidney: Secondary | ICD-10-CM

## 2016-01-13 DIAGNOSIS — E861 Hypovolemia: Secondary | ICD-10-CM | POA: Diagnosis present

## 2016-01-13 DIAGNOSIS — R197 Diarrhea, unspecified: Secondary | ICD-10-CM

## 2016-01-13 LAB — COMPREHENSIVE METABOLIC PANEL
ALBUMIN: 5 g/dL (ref 3.5–5.0)
ALK PHOS: 114 U/L (ref 38–126)
ALT: 16 U/L (ref 14–54)
AST: 26 U/L (ref 15–41)
Anion gap: 16 — ABNORMAL HIGH (ref 5–15)
BILIRUBIN TOTAL: 0.8 mg/dL (ref 0.3–1.2)
BUN: 48 mg/dL — AB (ref 6–20)
CALCIUM: 8.8 mg/dL — AB (ref 8.9–10.3)
CO2: 16 mmol/L — AB (ref 22–32)
CREATININE: 3.48 mg/dL — AB (ref 0.44–1.00)
Chloride: 93 mmol/L — ABNORMAL LOW (ref 101–111)
GFR calc Af Amer: 20 mL/min — ABNORMAL LOW (ref >60)
GFR calc non Af Amer: 17 mL/min — ABNORMAL LOW (ref >60)
GLUCOSE: 95 mg/dL (ref 65–99)
Potassium: 2.8 mmol/L — ABNORMAL LOW (ref 3.5–5.1)
SODIUM: 125 mmol/L — AB (ref 135–145)
TOTAL PROTEIN: 9 g/dL — AB (ref 6.5–8.1)

## 2016-01-13 LAB — CBC WITH DIFFERENTIAL/PLATELET
BASOS ABS: 0 10*3/uL (ref 0.0–0.1)
BASOS PCT: 0 %
Eosinophils Absolute: 0 10*3/uL (ref 0.0–0.7)
Eosinophils Relative: 0 %
HEMATOCRIT: 36.8 % (ref 36.0–46.0)
HEMOGLOBIN: 12.7 g/dL (ref 12.0–15.0)
Lymphocytes Relative: 19 %
Lymphs Abs: 1.5 10*3/uL (ref 0.7–4.0)
MCH: 26.8 pg (ref 26.0–34.0)
MCHC: 34.5 g/dL (ref 30.0–36.0)
MCV: 77.8 fL — ABNORMAL LOW (ref 78.0–100.0)
MONOS PCT: 14 %
Monocytes Absolute: 1.1 10*3/uL — ABNORMAL HIGH (ref 0.1–1.0)
NEUTROS ABS: 5.3 10*3/uL (ref 1.7–7.7)
NEUTROS PCT: 67 %
Platelets: 405 10*3/uL — ABNORMAL HIGH (ref 150–400)
RBC: 4.73 MIL/uL (ref 3.87–5.11)
RDW: 12.2 % (ref 11.5–15.5)
WBC: 7.9 10*3/uL (ref 4.0–10.5)

## 2016-01-13 LAB — I-STAT CG4 LACTIC ACID, ED: Lactic Acid, Venous: 1.39 mmol/L (ref 0.5–2.0)

## 2016-01-13 MED ORDER — POTASSIUM CHLORIDE 10 MEQ/100ML IV SOLN
10.0000 meq | Freq: Once | INTRAVENOUS | Status: AC
  Administered 2016-01-13: 10 meq via INTRAVENOUS
  Filled 2016-01-13: qty 100

## 2016-01-13 MED ORDER — SODIUM CHLORIDE 0.9 % IV BOLUS (SEPSIS)
1000.0000 mL | Freq: Once | INTRAVENOUS | Status: AC
  Administered 2016-01-13: 1000 mL via INTRAVENOUS

## 2016-01-13 MED ORDER — MORPHINE SULFATE (PF) 4 MG/ML IV SOLN
4.0000 mg | Freq: Once | INTRAVENOUS | Status: AC
  Administered 2016-01-13: 4 mg via INTRAVENOUS
  Filled 2016-01-13: qty 1

## 2016-01-13 MED ORDER — ONDANSETRON HCL 4 MG/2ML IJ SOLN
4.0000 mg | Freq: Once | INTRAMUSCULAR | Status: AC
  Administered 2016-01-13: 4 mg via INTRAVENOUS
  Filled 2016-01-13: qty 2

## 2016-01-13 MED ORDER — POTASSIUM CHLORIDE 20 MEQ/15ML (10%) PO SOLN
40.0000 meq | Freq: Once | ORAL | Status: AC
Start: 2016-01-13 — End: 2016-01-13
  Administered 2016-01-13: 40 meq via ORAL
  Filled 2016-01-13: qty 30

## 2016-01-13 MED ORDER — POTASSIUM CHLORIDE CRYS ER 20 MEQ PO TBCR
40.0000 meq | EXTENDED_RELEASE_TABLET | Freq: Once | ORAL | Status: DC
  Filled 2016-01-13: qty 2

## 2016-01-13 NOTE — ED Provider Notes (Signed)
CSN: [id number]     Arrival date & time 01/13/16  2004 History   First MD Initiated Contact with Patient 01/13/16 2051     Chief Complaint  Patient presents with  . Generalized Body Aches     (Consider location/radiation/quality/duration/timing/severity/associated sxs/prior Treatment) HPI  25 year old female with prior history of colon cancer with colon resection status post ileostomy presenting with generalized fatigue and body aches. 3 days ago patient developed diffuse abdominal pain with nausea and vomiting for which she was seen in the ED for the symptoms. This event started after she went out with friends several days prior and was eating at an [employer], sport and exercise.  In the ED, She had an abdominal and pelvis CT scan which show a nonobstructive right kidney stone. Her labs with evidence of  AKI with a creatinine of 2.76, elevated from a normal baseline. Received IV fluids and symptomatic treatment and subsequently discharge. She mentioned that she felt better the next day but today she complained of feeling lightheadedness, dizziness, generalized weakness, myalgias, having difficulty sleeping. She endorse nausea without vomiting or diarrhea. She denies having any severe headache, runny nose, sneezing, coughing, chest pain, shortness of breath, productive cough, abdominal pain, back pain, dysuria. She has a ostomy bag and states that her output has been normal. She admits that she has history of low blood pressure with 0000000 systolic being her normal baseline. She did not have a flu shot or pneumonia shot this year. Patient mentioned she was initially diagnosed with colon cancer when she developed flulike symptoms with abdominal pain in the past. She is adopted therefore family history is unknown.       Past Medical History  Diagnosis Date  . UTI (lower urinary tract infection)   . Heart murmur   . Pneumonia 2008; 2012  . Complication of anesthesia     "I swallowed the anesthesia when  appendix taken out; got pneumonia"  . Iron deficiency anemia   . History of blood transfusion 04/26/2013    "4 bags" (05/21/2013)  . Colon cancer ([place]) 2014    s/p resection and ileostomy  . 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 FAP (familial adenomatous polyposis) 09/30/2014  . Nephrolithiasis 09/30/2014  . Cancer ([place])   . Colon cancer ([place])   . [facility]leostomy care [facility])    Past Surgical History  Procedure Laterality Date  . Leg reconstruction using fascial flap  ~ 2009  . Still born baby  03[birth year]/13    delivered vaginally @ ~ 24 weeks  . Appendectomy  2008  . Colonoscopy N/A 05/24/2013    Procedure: COLONOSCOPY;  Surgeon: Missy Sabins, MD;  Location: [facility];  Service: Endoscopy;  Laterality: N/A;  . Colon surgery    . Ileostomy  06/26/2013  . I&d extremity Left 11/14/2013    Procedure: IRRIGATION AND DEBRIDEMENT LEFT LONG FINGER;  Surgeon: Tennis Must, MD;  Location: [place];  Service: Orthopedics;  Laterality: Left;  . Appendectomy    . Ileostomy     Family History  Problem Relation Age of Onset  . Adopted: Yes  . Alcohol abuse Neg Hx   . Arthritis Neg Hx   . Asthma Neg Hx   . Birth defects Neg Hx   . Cancer Neg Hx   . COPD Neg Hx   . Depression Neg Hx   . Diabetes Neg Hx   . Drug abuse Neg Hx   . Early death  Neg Hx   . Hearing loss Neg Hx   . Heart disease Neg Hx   . Hyperlipidemia Neg Hx   . Hypertension Neg Hx   . Kidney disease Neg Hx   . Learning disabilities Neg Hx   . Mental illness Neg Hx   . Mental retardation Neg Hx   . Miscarriages / Stillbirths Neg Hx   . Stroke Neg Hx   . Vision loss Neg Hx   . ALS Mother    Social History  Substance Use Topics  . Smoking status: Never Smoker   . Smokeless tobacco: None  . Alcohol Use: No   OB History    Gravida Para Term Preterm AB TAB SAB Ectopic Multiple Living   1 1 0 1 0 0 0 0       Review of Systems  All other systems reviewed and are  negative.     Allergies  Orange fruit; Soap; Levaquin; Orange fruit; Soap; and Levaquin  Home Medications   Prior to Admission medications   Medication Sig Start Date End Date Taking? Authorizing Provider  citric acid-potassium citrate (POLYCITRA) 1100-334 MG/5ML solution Take 30 mLs by mouth 4 (four) times daily - after meals and at bedtime. 07/22/15   Historical Provider, MD  EPINEPHrine 0.3 mg/0.3 mL IJ SOAJ injection Inject 0.3 mg into the muscle as needed (allergic reaction.). 08/10/13   Sharmon Leyden, MD  feeding supplement, RESOURCE BREEZE, (RESOURCE BREEZE) LIQD Take 1 Container by mouth 3 (three) times daily between meals. 05/03/15   Venetia Maxon Rama, MD  lactose free nutrition (BOOST PLUS) LIQD Take 237 mLs by mouth 2 (two) times daily between meals. Patient not taking: Reported on 12/06/2014 09/09/14   Robbie Lis, MD  magnesium oxide (MAG-OX) 400 MG tablet Take 400 mg by mouth 2 (two) times daily. 07/22/15   Historical Provider, MD  Multiple Vitamins-Minerals (ONE DAILY PLUS IRON) TABS Take 1 tablet by mouth daily. Patient not taking: Reported on 04/30/2015 02/17/15   Barton Dubois, MD  ondansetron (ZOFRAN ODT) 8 MG disintegrating tablet Take 1 tablet (8 mg total) by mouth every 8 (eight) hours as needed for nausea or vomiting. Patient not taking: Reported on 04/30/2015 02/17/15   Barton Dubois, MD  Ondansetron HCl (ZOFRAN PO) Take by mouth.    Historical Provider, MD  potassium chloride (K-DUR) 10 MEQ tablet Take 3 tabs daily. Patient not taking: Reported on 04/30/2015 02/17/15   Barton Dubois, MD  promethazine (PHENERGAN) 12.5 MG tablet Take 1 tablet (12.5 mg total) by mouth every 6 (six) hours as needed for nausea or vomiting. 12/09/14   Theodis Blaze, MD   BP 82/57 mmHg  Pulse 122  Temp(Src) 97.8 F (36.6 C) (Oral)  Resp 18  Ht 5\' 1"  (1.549 m)  Wt 47.174 kg  BMI 19.66 kg/m2  SpO2 100%  LMP 12/31/2015 Physical Exam  Constitutional: She is oriented to person, place, and time. She  appears well-developed and well-nourished. No distress.  Frail-appearing female laying in bed in no acute discomfort.  HENT:  Head: Atraumatic.  Right Ear: External ear normal.  Left Ear: External ear normal.  Nose: Nose normal.  Mouth/Throat: Oropharynx is clear and moist. No oropharyngeal exudate.  Eyes: Conjunctivae are normal.  Neck: Normal range of motion. Neck supple.  No nuchal rigidity  Cardiovascular:  Mild tachycardia without murmurs or gallops  Pulmonary/Chest: Effort normal and breath sounds normal. No respiratory distress. She has no wheezes. She has no rales. She exhibits no tenderness.  Abdominal: Soft. She exhibits no distension. There is no tenderness.  Ostomy bag to the right side of abdomen with normal liquid content.  Musculoskeletal:  Global weakness but able to move all 4 extremities  Neurological: She is alert and oriented to person, place, and time.  Skin: No rash noted.  Psychiatric: She has a normal mood and affect.  Nursing note and vitals reviewed.   ED Course  Procedures (including critical care time) Labs Review Labs Reviewed  CBC WITH DIFFERENTIAL/PLATELET - Abnormal; Notable for the following:    MCV 77.8 (*)    Platelets 405 (*)    Monocytes Absolute 1.1 (*)    All other components within normal limits  COMPREHENSIVE METABOLIC PANEL - Abnormal; Notable for the following:    Sodium 125 (*)    Potassium 2.8 (*)    Chloride 93 (*)    CO2 16 (*)    BUN 48 (*)    Creatinine, Ser 3.48 (*)    Calcium 8.8 (*)    Total Protein 9.0 (*)    GFR calc non Af Amer 17 (*)    GFR calc Af Amer 20 (*)    Anion gap 16 (*)    All other components within normal limits  INFLUENZA PANEL BY PCR (TYPE A & B, H1N1)  I-STAT CG4 LACTIC ACID, ED    Imaging Review Dg Chest 2 View  01/13/2016  CLINICAL DATA:  Myalgias and weakness.  Tachycardia. EXAM: CHEST  2 VIEW COMPARISON:  07/28/2015 FINDINGS: Normal heart size and mediastinal contours. No acute infiltrate or  edema. Symmetric nipple shadows noted. No effusion or pneumothorax. IMPRESSION: Negative chest. Electronically Signed   By: Monte Fantasia M.D.   On: 01/13/2016 21:44   I have personally reviewed and evaluated these images and lab results as part of my medical decision-making.   EKG Interpretation None     ED ECG REPORT   Date: 01/13/2016  Rate: 94  Rhythm: normal sinus rhythm  QRS Axis: normal  Intervals: normal Borderline prolonged QT interval  ST/T Wave abnormalities: normal  Conduction Disutrbances:none  Narrative Interpretation:   Old EKG Reviewed: none available  I have personally reviewed the EKG tracing and agree with the computerized printout as noted.   MDM   Final diagnoses:  Nausea vomiting and diarrhea  Dehydration  Hypokalemia  AKI (acute kidney injury) (HCC)    BP 81/53 mmHg  Pulse 122  Temp(Src) 97.8 F (36.6 C) (Oral)  Resp 18  Ht 5\' 1"  (1.549 m)  Wt 47.174 kg  BMI 19.66 kg/m2  SpO2 100%  LMP 12/31/2015   9:54 PM Patient with complete colectomy status post colostomy here with symptoms consistence with viral GI. She was seen 3 days ago and had a CT scan of abdomen and pelvis that shows no acute finding. Symptoms got better but now Much worse and now she is lightheadedness, dizziness, generalized weakness and appears dehydrated. She was tachycardic with a heart rate in the [id number] and her systolic blood pressure in the 60s 70s. Her labs are remarkable for worsen history of present illness with a creatinine of 3.48, BUN 48 and a GFR of 20. Hypokalemia with a potassium of 2.8, sodium of 125. Potassium supplementation given in the ED along with IV fluid and aggressive hydration. Her chest x-ray today shows no signs of pneumonia. She did had a urinalysis 3 days ago without any signs of urinary tract infection. She is not sexually active and does not have any lower abdominal  pain. Patient will benefit from admission for hydration and further management of her  condition.  10:20 PM Due to low potassium of 2.8, EKG was obtained showing no peaked T wave.  Borderline prolonged QT.  Will continue IV hydration.    Appreciate consultation from [facility], Dr. Fuller Plan from [facility] who agrees to admit pt to step down.    Domenic Moras, PA-C 01/13/16 [id number]  Blanchie Dessert, MD 01/14/16 [id number]

## 2016-01-13 NOTE — ED Notes (Signed)
Pt reports body aches/cramps states she has not been able to sleep and states that she gets agitated

## 2016-01-14 DIAGNOSIS — E860 Dehydration: Secondary | ICD-10-CM

## 2016-01-14 DIAGNOSIS — N200 Calculus of kidney: Secondary | ICD-10-CM

## 2016-01-14 LAB — BASIC METABOLIC PANEL
Anion gap: 7 (ref 5–15)
BUN: 38 mg/dL — AB (ref 6–20)
CALCIUM: 7.7 mg/dL — AB (ref 8.9–10.3)
CO2: 16 mmol/L — AB (ref 22–32)
Chloride: 107 mmol/L (ref 101–111)
Creatinine, Ser: 2.66 mg/dL — ABNORMAL HIGH (ref 0.44–1.00)
GFR calc Af Amer: 27 mL/min — ABNORMAL LOW (ref >60)
GFR, EST NON AFRICAN AMERICAN: 24 mL/min — AB (ref >60)
GLUCOSE: 102 mg/dL — AB (ref 65–99)
POTASSIUM: 3.4 mmol/L — AB (ref 3.5–5.1)
Sodium: 130 mmol/L — ABNORMAL LOW (ref 135–145)

## 2016-01-14 LAB — COMPREHENSIVE METABOLIC PANEL
ALK PHOS: 72 U/L (ref 38–126)
ALT: 15 U/L (ref 14–54)
ANION GAP: 6 (ref 5–15)
AST: 27 U/L (ref 15–41)
Albumin: 3 g/dL — ABNORMAL LOW (ref 3.5–5.0)
BILIRUBIN TOTAL: 0.3 mg/dL (ref 0.3–1.2)
BUN: 21 mg/dL — ABNORMAL HIGH (ref 6–20)
CALCIUM: 7.3 mg/dL — AB (ref 8.9–10.3)
CO2: 15 mmol/L — AB (ref 22–32)
CREATININE: 1.6 mg/dL — AB (ref 0.44–1.00)
Chloride: 114 mmol/L — ABNORMAL HIGH (ref 101–111)
GFR calc Af Amer: 51 mL/min — ABNORMAL LOW (ref >60)
GFR, EST NON AFRICAN AMERICAN: 44 mL/min — AB (ref >60)
Glucose, Bld: 83 mg/dL (ref 65–99)
Potassium: 2.7 mmol/L — CL (ref 3.5–5.1)
SODIUM: 135 mmol/L (ref 135–145)
TOTAL PROTEIN: 5.4 g/dL — AB (ref 6.5–8.1)

## 2016-01-14 LAB — MAGNESIUM: Magnesium: 1.8 mg/dL (ref 1.7–2.4)

## 2016-01-14 LAB — C-REACTIVE PROTEIN: CRP: <0.5 mg/dL (ref <1.0)

## 2016-01-14 LAB — C DIFFICILE QUICK SCREEN W PCR REFLEX
C DIFFICILE (CDIFF) TOXIN: NEGATIVE
C DIFFICLE (CDIFF) ANTIGEN: NEGATIVE
C Diff interpretation: NEGATIVE

## 2016-01-14 LAB — INFLUENZA PANEL BY PCR (TYPE A & B)
H1N1FLUPCR: NOT DETECTED
INFLAPCR: NEGATIVE
Influenza B By PCR: NEGATIVE

## 2016-01-14 LAB — PHOSPHORUS: PHOSPHORUS: 5 mg/dL — AB (ref 2.5–4.6)

## 2016-01-14 LAB — MRSA PCR SCREENING: MRSA BY PCR: NEGATIVE

## 2016-01-14 MED ORDER — POTASSIUM CHLORIDE CRYS ER 20 MEQ PO TBCR
20.0000 meq | EXTENDED_RELEASE_TABLET | Freq: Once | ORAL | Status: AC
  Administered 2016-01-14: 20 meq via ORAL

## 2016-01-14 MED ORDER — FAMOTIDINE 20 MG PO TABS
20.0000 mg | ORAL_TABLET | Freq: Two times a day (BID) | ORAL | Status: DC
  Administered 2016-01-14: 20 mg via ORAL
  Filled 2016-01-14: qty 1

## 2016-01-14 MED ORDER — SODIUM CHLORIDE 0.9 % IV SOLN
INTRAVENOUS | Status: DC
  Administered 2016-01-14 (×2): via INTRAVENOUS

## 2016-01-14 MED ORDER — SODIUM CHLORIDE 0.9 % IV SOLN
1.0000 g | Freq: Once | INTRAVENOUS | Status: AC
  Administered 2016-01-14: 1 g via INTRAVENOUS
  Filled 2016-01-14: qty 10

## 2016-01-14 MED ORDER — METHOCARBAMOL 500 MG PO TABS: 500.0000 mg | ORAL_TABLET | Freq: Four times a day (QID) | ORAL | Status: DC | PRN

## 2016-01-14 MED ORDER — SODIUM CHLORIDE 0.9 % IV BOLUS (SEPSIS)
500.0000 mL | INTRAVENOUS | Status: AC
  Administered 2016-01-14: 500 mL via INTRAVENOUS

## 2016-01-14 MED ORDER — POTASSIUM CITRATE-CITRIC ACID 1100-334 MG/5ML PO SOLN
30.0000 mL | Freq: Three times a day (TID) | ORAL | Status: DC
  Filled 2016-01-14 (×5): qty 30

## 2016-01-14 MED ORDER — SODIUM CHLORIDE 0.9 % IV BOLUS (SEPSIS)
1000.0000 mL | Freq: Once | INTRAVENOUS | Status: AC
  Administered 2016-01-14: 1000 mL via INTRAVENOUS

## 2016-01-14 MED ORDER — POTASSIUM CHLORIDE IN NACL 20-0.9 MEQ/L-% IV SOLN
INTRAVENOUS | Status: DC
Start: 2016-01-14 — End: 2016-01-15
  Administered 2016-01-14 – 2016-01-15 (×3): via INTRAVENOUS
  Filled 2016-01-14 (×4): qty 1000

## 2016-01-14 MED ORDER — ACETAMINOPHEN 650 MG RE SUPP: 650.0000 mg | Freq: Four times a day (QID) | RECTAL | Status: DC | PRN

## 2016-01-14 MED ORDER — ONDANSETRON HCL 4 MG PO TABS: 4.0000 mg | ORAL_TABLET | Freq: Four times a day (QID) | ORAL | Status: DC | PRN

## 2016-01-14 MED ORDER — ACETAMINOPHEN 325 MG PO TABS: 650.0000 mg | ORAL_TABLET | Freq: Four times a day (QID) | ORAL | Status: DC | PRN

## 2016-01-14 MED ORDER — BOOST / RESOURCE BREEZE PO LIQD
1.0000 | Freq: Three times a day (TID) | ORAL | Status: DC
  Administered 2016-01-14 – 2016-01-15 (×5): 1 via ORAL

## 2016-01-14 MED ORDER — FAMOTIDINE IN NACL 20-0.9 MG/50ML-% IV SOLN: 20.0000 mg | Freq: Two times a day (BID) | INTRAVENOUS | Status: DC

## 2016-01-14 MED ORDER — ALBUTEROL SULFATE (2.5 MG/3ML) 0.083% IN NEBU: 2.5000 mg | INHALATION_SOLUTION | RESPIRATORY_TRACT | Status: DC | PRN

## 2016-01-14 MED ORDER — ONDANSETRON HCL 4 MG/2ML IJ SOLN: 4.0000 mg | Freq: Four times a day (QID) | INTRAMUSCULAR | Status: DC | PRN

## 2016-01-14 MED ORDER — HEPARIN SODIUM (PORCINE) 5000 UNIT/ML IJ SOLN
5000.0000 [IU] | Freq: Three times a day (TID) | INTRAMUSCULAR | Status: DC
  Administered 2016-01-14 – 2016-01-15 (×4): 5000 [IU] via SUBCUTANEOUS
  Filled 2016-01-14 (×6): qty 1

## 2016-01-14 MED ORDER — POTASSIUM CHLORIDE 10 MEQ/100ML IV SOLN
10.0000 meq | INTRAVENOUS | Status: AC
  Administered 2016-01-14 (×3): 10 meq via INTRAVENOUS
  Filled 2016-01-14 (×3): qty 100

## 2016-01-14 MED ORDER — SODIUM CHLORIDE 0.9% FLUSH
3.0000 mL | Freq: Two times a day (BID) | INTRAVENOUS | Status: DC
  Administered 2016-01-14: 3 mL via INTRAVENOUS

## 2016-01-14 MED ORDER — SODIUM CHLORIDE 0.9 % IV SOLN
10.0000 mg | Freq: Two times a day (BID) | INTRAVENOUS | Status: DC
  Administered 2016-01-14: 10 mg via INTRAVENOUS
  Filled 2016-01-14: qty 1

## 2016-01-14 MED ORDER — RANITIDINE HCL 150 MG/10ML PO SYRP
150.0000 mg | ORAL_SOLUTION | Freq: Two times a day (BID) | ORAL | Status: DC
  Administered 2016-01-14 – 2016-01-15 (×2): 150 mg via ORAL
  Filled 2016-01-14 (×4): qty 10

## 2016-01-14 MED ORDER — MORPHINE SULFATE (PF) 2 MG/ML IV SOLN: 1.0000 mg | INTRAVENOUS | Status: DC | PRN

## 2016-01-14 MED ORDER — FAMOTIDINE 40 MG/5ML PO SUSR: 20.0000 mg | Freq: Two times a day (BID) | ORAL | Status: DC

## 2016-01-14 NOTE — Progress Notes (Addendum)
CRITICAL VALUE ALERT  Critical value received:  K 2.7  Date of notification:  01/14/16  Time of notification:  1800  Critical value read back: yes  Nurse who received alert:  Mannie Stabile RN  MD notified (1st page):  yes  Time of first page:  1803  MD notified (2nd page):  Time of second page:  Responding MD:  Dr. Posey Pronto @ [phone]

## 2016-01-14 NOTE — Consult Note (Signed)
WOC ostomy consult note Stoma type/location: RLQ Ileostomy Stomal assessment/size: 1 1/2" round pink and moist, visualized through clear pouch.  Patient states she is wearing a 2 piece flat pouch at this time.  Was using 1 piece convex last stay, but will provide her with 2 1/4 " 2 piece flat system.  She states she is not using a barrier ring, but will leave 2 at bedside incase she needs it.  States her skin is intact at this time.  She is lying on her side, and is uncomfortable at this time.  Allows me to visualize and measure stoma, does not want a pouch change at this time.  States she is independent with her care and will change when needed. Peristomal assessment: Intact per patient.  Treatment options for stomal/peristomal skin:Flat pouch, 2 piece system.  Left supplies over the sink .   Output Liquid brown stool Ostomy pouching: 2pc. 2 1/4" system. Wears 1 3/4" at home, informed we do not carry that size here.   Education provided: Is independent in self care.  None needed.  Stated she has changed to flat pouch, and 2 piece system.  Enrolled patient in [place] program: No Will not follow at this time.  Please re-consult if needed.  Domenic Moras RN BSN [place] Pager [phone]

## 2016-01-14 NOTE — H&P (Signed)
[facility] History and Physical  Cindy [name] [id number] DOB: 01/04/1991 DOA: 01/13/2016  Referring physician: [facility] PCP: Vidal Schwalbe, MD   Chief Complaint: generalized body aches  HPI:  Cindy [name] is a 25 year old female with a past medical history significant for FAP s/p ileostomy, arterial hypotension, and nephrolithiasias; who presents with complaints nausea, vomiting, and abdominal pain over the last 3 days. Previously evaluated at the emergency department 3 days ago(1/13). Symptoms started after eating out at a restaurant that she had not been to before.  Nothing helped make symptoms better and movement worsened symptoms. Denies any recent sick contacts, fever, dysuria, frequency of urination, or chills. At the emergency department she was evaluated and given IV fluids and anti-nausea medication and discharged home. Patient states that she felt somewhat better following day but presents today with complaints of lightheadedness, generalized body aches, malaise, and generalized weakness. Patient states that she still feels nauseous but has not vomited so today. Reports that her ostomy bag output has been normal.  Upon admission to the emergency department patient found to be significantly hypotensive with blood pressures as well as 60s /40s and tachycardic with heart rates in the 120s. Patient notes that her systolic blood pressure normally runs in the 80s to 90s. Initial lab work showed sodium 125, potassium 2.8, creatinine 3.48, and BUN 48.    Review of Systems  Constitutional: Positive for malaise/fatigue. Negative for fever and chills.  HENT: Negative for ear pain and tinnitus.   Eyes: Negative for double vision and photophobia.  Respiratory: Negative for hemoptysis and sputum production.   Cardiovascular: Negative for palpitations.  Gastrointestinal: Positive for nausea, vomiting and abdominal pain.  Genitourinary: Negative for urgency and frequency.   Musculoskeletal: Positive for myalgias. Negative for falls.  Skin: Negative.  Negative for itching and rash.  Neurological: Positive for weakness. Negative for focal weakness and seizures.  Endo/Heme/Allergies: Negative for environmental allergies. Does not bruise/bleed easily.  Psychiatric/Behavioral: Negative for hallucinations and substance abuse.        Past Medical History  Diagnosis Date  . UTI (lower urinary tract infection)   . Heart murmur   . Pneumonia 2008; 2012  . Complication of anesthesia     "I swallowed the anesthesia when appendix taken out; got pneumonia"  . Iron deficiency anemia   . History of blood transfusion 04/26/2013    "4 bags" (05/21/2013)  . Colon cancer ([place]) 2014    s/p resection and ileostomy  . 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 FAP (familial adenomatous polyposis) 09/30/2014  . Nephrolithiasis 09/30/2014  . Cancer ([place])   . Colon cancer ([place])   . Ileostomy care [facility])      Past Surgical History  Procedure Laterality Date  . Leg reconstruction using fascial flap  ~ 2009  . Still born baby  03/22/13    delivered vaginally @ ~ 24 weeks  . Appendectomy  2008  . Colonoscopy N/A 05/24/2013    Procedure: COLONOSCOPY;  Surgeon: Missy Sabins, MD;  Location: [place];  Service: Endoscopy;  Laterality: N/A;  . Colon surgery    . Ileostomy  06/26/2013  . I&d extremity Left 11/14/2013    Procedure: IRRIGATION AND DEBRIDEMENT LEFT LONG FINGER;  Surgeon: Tennis Must, MD;  Location: [place];  Service: Orthopedics;  Laterality: Left;  . Appendectomy    . Ileostomy        Social History:  reports  that she has never smoked. She does not have any smokeless tobacco history on file. She reports that she does not drink alcohol or use illicit drugs.   Allergies  Allergen Reactions  . Orange Fruit [Citrus] Hives and Nausea And Vomiting  . Soap Hives and Itching    *Dove Soap*  . Levaquin  [Levofloxacin In D5w]     hives  . 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 Soap Hives    "dove" soap only. Patient breaks out in hives//a.[place]  . Levaquin [Levofloxacin] Hives    Family History  Problem Relation Age of Onset  . Adopted: Yes  . Alcohol abuse Neg Hx   . Arthritis Neg Hx   . Asthma Neg Hx   . Birth defects Neg Hx   . Cancer Neg Hx   . COPD Neg Hx   . Depression Neg Hx   . Diabetes Neg Hx   . Drug abuse Neg Hx   . Early death Neg Hx   . Hearing loss Neg Hx   . Heart disease Neg Hx   . Hyperlipidemia Neg Hx   . Hypertension Neg Hx   . Kidney disease Neg Hx   . Learning disabilities Neg Hx   . Mental illness Neg Hx   . Mental retardation Neg Hx   . Miscarriages / Stillbirths Neg Hx   . Stroke Neg Hx   . Vision loss Neg Hx   . ALS Mother        Prior to Admission medications   Medication Sig Start Date End Date Taking? Authorizing Provider  feeding supplement, RESOURCE BREEZE, (RESOURCE BREEZE) LIQD Take 1 Container by mouth 3 (three) times daily between meals. Patient not taking: Reported on 01/14/2016 05/03/15   Venetia Maxon Rama, MD  lactose free nutrition (BOOST PLUS) LIQD Take 237 mLs by mouth 2 (two) times daily between meals. Patient not taking: Reported on 12/06/2014 09/09/14   Robbie Lis, MD  Multiple Vitamins-Minerals (ONE DAILY PLUS IRON) TABS Take 1 tablet by mouth daily. Patient not taking: Reported on 04/30/2015 02/17/15   Barton Dubois, MD  ondansetron (ZOFRAN ODT) 8 MG disintegrating tablet Take 1 tablet (8 mg total) by mouth every 8 (eight) hours as needed for nausea or vomiting. Patient not taking: Reported on 04/30/2015 02/17/15   Barton Dubois, MD  potassium chloride (K-DUR) 10 MEQ tablet Take 3 tabs daily. Patient not taking: Reported on 04/30/2015 02/17/15   Barton Dubois, MD  promethazine (PHENERGAN) 12.5 MG tablet Take 1 tablet (12.5 mg total) by mouth every 6 (six) hours as needed for nausea or vomiting. Patient not taking: Reported on 01/14/2016  12/09/14   Theodis Blaze, MD     Physical Exam: Filed Vitals:   01/14/16 0100 01/14/16 0130 01/14/16 0156 01/14/16 0400  BP:  76/42  72/31  Pulse: 84  75 69  Temp: 98.3 F (36.8 C)     TempSrc: Oral     Resp: 13  14 13   Height:      Weight:      SpO2: 99%  99% 100%     Constitutional: Vital signs reviewed. Patient is thin female lying in the bed in no acute distress Head: Normocephalic and atraumatic  Ear: TM normal bilaterally  Mouth: no erythema or exudates,   Eyes: PERRL, EOMI, conjunctivae normal, No scleral icterus.  Neck: Supple, Trachea midline normal ROM, No JVD, mass, thyromegaly, or carotid bruit present.  Cardiovascular: RRR, S1 normal, S2 normal, positive 2/6 systolic ejection  murmur, pulses symmetric and intact bilaterally  Pulmonary/Chest: CTAB, no wheezes, rales, or rhonchi  Abdominal: Soft. Non-tender, non-distended, bowel sounds are normal, no masses, organomegaly, or guarding present. Ostomy bag to the right side of abdomen with liquid contents GU: no CVA tenderness Musculoskeletal: No joint deformities, erythema, or stiffness, ROM full and no nontender Ext: no edema and no cyanosis, pulses palpable bilaterally (DP and PT)  Hematology: no cervical, inginal, or axillary adenopathy.  Neurological: Lethargic, but easily arousable to verbal commands. Strenght is normal and symmetric bilaterally, cranial nerve II-XII are grossly intact, no focal motor deficit, sensory intact to light touch bilaterally.  Skin: Warm, dry and intact. No rash, cyanosis, or clubbing.  Psychiatric: Normal mood and affect. speech and behavior is normal. Judgment and thought content normal. Cognition and memory are normal.      Data Review   Micro Results Recent Results (from the past 240 hour(s))  Urine culture     Status: None   Collection Time: 01/10/16 11:38 PM  Result Value Ref Range Status   Specimen Description URINE, CLEAN CATCH  Final   Special Requests NONE  Final    Culture   Final    5,000 COLONIES/mL INSIGNIFICANT GROWTH Performed at [facility]    Report Status 01/12/2016 FINAL  Final    Radiology Reports Ct Abdomen Pelvis Wo Contrast  01/11/2016  CLINICAL DATA:  Mid abdominal pain this morning. History of renal stones. Previous history of colon cancer post resection. EXAM: CT ABDOMEN AND PELVIS WITHOUT CONTRAST TECHNIQUE: Multidetector CT imaging of the abdomen and pelvis was performed following the standard protocol without IV contrast. COMPARISON:  None. FINDINGS: The lung bases are clear. There is a 7 mm stone in the right renal pelvis. No hydronephrosis or hydroureter. No additional renal or ureteral stones identified. Bladder is mostly decompressed. No stones identified. No bladder wall thickening. The unenhanced appearance of the liver, spleen, gallbladder, pancreas, adrenal glands, abdominal aorta, inferior vena cava, and retroperitoneal lymph nodes is unremarkable. Postoperative colectomy with right lower quadrant ileostomy. Stomach and small bowel are not abnormally distended. No bowel wall thickening is appreciated. Contrast material flows through to the colostomy without evidence of obstruction. Abdominal wall musculature appears intact. No free air or free fluid in the abdomen. Pelvis: There is a small amount of free fluid in the pelvis, likely physiologic. Fluid tracks around anterior to the uterus. This is probably free fluid but could represent hydrosalpinx. Uterus and ovaries are not enlarged. No pelvic mass or lymphadenopathy. No destructive bone lesions. IMPRESSION: 7 mm stone in the right renal pelvis without proximal obstruction. Free fluid in the pelvis is likely physiologic. Can't exclude hydrosalpinx. Postoperative colectomy with right lower quadrant ileostomy. Electronically Signed   By: Lucienne Capers M.D.   On: 01/11/2016 01:05   Dg Chest 2 View  01/13/2016  CLINICAL DATA:  Myalgias and weakness.  Tachycardia. EXAM: CHEST  2  VIEW COMPARISON:  07/28/2015 FINDINGS: Normal heart size and mediastinal contours. No acute infiltrate or edema. Symmetric nipple shadows noted. No effusion or pneumothorax. IMPRESSION: Negative chest. Electronically Signed   By: Monte Fantasia M.D.   On: 01/13/2016 21:44     CBC  Recent Labs Lab 01/10/16 2150 01/13/16 2100  WBC 11.9* 7.9  HGB 13.9 12.7  HCT 41.4 36.8  PLT 386 405*  MCV 80.7 77.8*  MCH 27.1 26.8  MCHC 33.6 34.5  RDW 12.7 12.2  LYMPHSABS 0.9 1.5  MONOABS 0.7 1.1*  EOSABS 0.0 0.0  BASOSABS 0.0 0.0    Chemistries   Recent Labs Lab 01/10/16 2150 01/13/16 2100 01/14/16 0145  NA 131* 125* 130*  K 3.2* 2.8* 3.4*  CL 96* 93* 107  CO2 19* 16* 16*  GLUCOSE 106* 95 102*  BUN 21* 48* 38*  CREATININE 2.76* 3.48* 2.66*  CALCIUM 10.1 8.8* 7.7*  MG  --   --  1.8  AST 28 26  --   ALT 17 16  --   ALKPHOS 125 114  --   BILITOT 1.2 0.8  --    ------------------------------------------------------------------------------------------------------------------ estimated creatinine clearance is 23.9 mL/min (by C-G formula based on Cr of 2.66). ------------------------------------------------------------------------------------------------------------------ No results for input(s): HGBA1C in the last 72 hours. ------------------------------------------------------------------------------------------------------------------ No results for input(s): CHOL, HDL, LDLCALC, TRIG, CHOLHDL, LDLDIRECT in the last 72 hours. ------------------------------------------------------------------------------------------------------------------ No results for input(s): TSH, T4TOTAL, T3FREE, THYROIDAB in the last 72 hours.  Invalid input(s): FREET3 ------------------------------------------------------------------------------------------------------------------ No results for input(s): VITAMINB12, FOLATE, FERRITIN, TIBC, IRON, RETICCTPCT in the last 72 hours.  Coagulation profile No  results for input(s): INR, PROTIME in the last 168 hours.  No results for input(s): DDIMER in the last 72 hours.  Cardiac Enzymes No results for input(s): CKMB, TROPONINI, MYOGLOBIN in the last 168 hours.  Invalid input(s): CK ------------------------------------------------------------------------------------------------------------------ Invalid input(s): POCBNP   CBG: No results for input(s): GLUCAP in the last 168 hours.     EKG: Independently reviewed. Sinus rhythm   Assessment/Plan     Severe dehydration secondary to nausea, vomiting, and abdominal pain: Patient given 2 L while at the outlying facility. Cause of patient's abdominal pain is unknown. Could be just a viral gastroenteritis. - Admit to stepdown - crp and C. difficile stool - Aggressive IV fluid rehydration bolus additional 500 mL of fluid and then put IV fluids at 125 mL - Zofran as needed for nausea - Check a C. difficile stool - Advance diet as tolerated   Arterial hypotension: Patient's baseline systolic blood pressures run in the 80s to 90s. On admission today systolic blood pressures dipped as low as 60s while at [facility]  - Continue to monitor   Acute renal failure ([place]) : Suspect secondary to patient's severe dehydration from nausea, vomiting, - Continue to monitor and repeat BMP  - Follow-up urine studies and consult nephrology if kidney function does not appear to improve   Nephrolithiasis: patient was seen have nonobstructing kidney stones on CT - Continue to monitor   Hypokalemia: Initial potassium 2.8 - 20 mEq of potassium chloride now  -Continue to monitor and replace as needed    Hypocalcemia: Calcium level seen to drop to 7.7 - 1 g of calcium gluconate IV now - Continue to monitor and replace as needed      FAP (familial adenomatous polyposis) s/p ileostomy: Appears to be stable - Ostomy care consult    Code Status:   full Family Communication: bedside Disposition  Plan: admit   Total time spent 55 minutes.Greater than 50% of this time was spent in counseling, explanation of diagnosis, planning of further management, and coordination of care  [facility] Pager [phone]  If 7PM-7AM, please contact night-coverage [email or web address] Password [email or web address] 01/14/2016, 4:30 AM

## 2016-01-14 NOTE — Progress Notes (Signed)
C Diff PCR is negative. Enteric Precautions discontinued.

## 2016-01-14 NOTE — Plan of Care (Signed)
[place] OF CARE NOTE Patient: Cindy [name] [id number]   PCP: Vidal Schwalbe, MD DOB: 1991-07-26   DOA: 01/13/2016   DOS: 01/14/2016    Patient was admitted by my colleague Dr Tamala Julian earlier on 01/14/2016. I have reviewed the H&P as well as assessment and plan and agree with the same, other than Important changes which are listed below.  Plan of care: Principal Problem:   Severe dehydration   Nausea with vomiting Patient has been given to normal saline bolus. Blood pressure is finally settled. We'll continue to closely monitor and if the blood pressure continues to remain stable patient can be likely be transferred to telemetry today.    Hypocalcemia  Hypokalemia   Acute renal failure ([place]) Rechecking CMP continue IV hydration  Author: Berle Mull, MD [facility] Pager: [phone] 01/14/2016 3:33 PM   If 7PM-7AM, please contact night-coverage at [email or web address], password [email or web address]

## 2016-01-14 NOTE — Care Management Note (Signed)
Case Management Note  Patient Details  Name: MAGIN MICHON MRN: [id number] Date of Birth: [birth year]  Subjective/Objective:       Endo f stage colon cancer and hypotensive status             Action/Plan:Date: January 14, 2016 Chart reviewed for concurrent status and case management needs. Will continue to follow patient for changes and needs: Velva Harman, BSN, RN, [place]   [phone]   Expected Discharge Date:                  Expected Discharge Plan:  Home/Self Care  In-House Referral:  NA  Discharge planning Services  CM Consult  Post Acute Care Choice:  NA Choice offered to:  NA  DME Arranged:    DME Agency:     HH Arranged:    HH Agency:     Status of Service:  In process, will continue to follow  Medicare Important Message Given:    Date Medicare IM Given:    Medicare IM give by:    Date Additional Medicare IM Given:    Additional Medicare Important Message give by:     If discussed at [place] of Stay Meetings, dates discussed:    Additional Comments:  Leeroy Cha, RN 01/14/2016, 11:33 AM

## 2016-01-15 DIAGNOSIS — R112 Nausea with vomiting, unspecified: Secondary | ICD-10-CM

## 2016-01-15 DIAGNOSIS — E876 Hypokalemia: Secondary | ICD-10-CM

## 2016-01-15 DIAGNOSIS — D126 Benign neoplasm of colon, unspecified: Secondary | ICD-10-CM

## 2016-01-15 DIAGNOSIS — I9589 Other hypotension: Secondary | ICD-10-CM

## 2016-01-15 DIAGNOSIS — N179 Acute kidney failure, unspecified: Principal | ICD-10-CM

## 2016-01-15 LAB — CBC WITH DIFFERENTIAL/PLATELET
BASOS ABS: 0 10*3/uL (ref 0.0–0.1)
BASOS PCT: 0 %
Eosinophils Absolute: 0.1 10*3/uL (ref 0.0–0.7)
Eosinophils Relative: 2 %
HEMATOCRIT: 26.5 % — AB (ref 36.0–46.0)
Hemoglobin: 8.8 g/dL — ABNORMAL LOW (ref 12.0–15.0)
LYMPHS PCT: 32 %
Lymphs Abs: 1.2 10*3/uL (ref 0.7–4.0)
MCH: 28.1 pg (ref 26.0–34.0)
MCHC: 33.2 g/dL (ref 30.0–36.0)
MCV: 84.7 fL (ref 78.0–100.0)
MONO ABS: 0.5 10*3/uL (ref 0.1–1.0)
Monocytes Relative: 15 %
NEUTROS ABS: 1.9 10*3/uL (ref 1.7–7.7)
NEUTROS PCT: 51 %
Platelets: 201 10*3/uL (ref 150–400)
RBC: 3.13 MIL/uL — AB (ref 3.87–5.11)
RDW: 13.1 % (ref 11.5–15.5)
WBC: 3.7 10*3/uL — AB (ref 4.0–10.5)

## 2016-01-15 LAB — BASIC METABOLIC PANEL
ANION GAP: 7 (ref 5–15)
ANION GAP: 5 (ref 5–15)
BUN: 15 mg/dL (ref 6–20)
BUN: 11 mg/dL (ref 6–20)
CALCIUM: 7.6 mg/dL — AB (ref 8.9–10.3)
CALCIUM: 7.6 mg/dL — AB (ref 8.9–10.3)
CHLORIDE: 118 mmol/L — AB (ref 101–111)
CO2: 13 mmol/L — AB (ref 22–32)
CO2: 13 mmol/L — AB (ref 22–32)
CREATININE: 1.33 mg/dL — AB (ref 0.44–1.00)
Chloride: 118 mmol/L — ABNORMAL HIGH (ref 101–111)
Creatinine, Ser: 1.32 mg/dL — ABNORMAL HIGH (ref 0.44–1.00)
GFR calc Af Amer: >60 mL/min (ref >60)
GFR calc non Af Amer: 55 mL/min — ABNORMAL LOW (ref >60)
GFR, EST NON AFRICAN AMERICAN: 55 mL/min — AB (ref >60)
GLUCOSE: 93 mg/dL (ref 65–99)
GLUCOSE: 92 mg/dL (ref 65–99)
POTASSIUM: 3.3 mmol/L — AB (ref 3.5–5.1)
Potassium: 3 mmol/L — ABNORMAL LOW (ref 3.5–5.1)
SODIUM: 138 mmol/L (ref 135–145)
Sodium: 136 mmol/L (ref 135–145)

## 2016-01-15 LAB — MAGNESIUM
Magnesium: 1.2 mg/dL — ABNORMAL LOW (ref 1.7–2.4)
Magnesium: 2 mg/dL (ref 1.7–2.4)

## 2016-01-15 MED ORDER — LOPERAMIDE HCL 1 MG/5ML PO LIQD: 2.0000 mg | ORAL | Status: DC | PRN

## 2016-01-15 MED ORDER — MAGNESIUM SULFATE 2 GM/50ML IV SOLN
2.0000 g | Freq: Once | INTRAVENOUS | Status: AC
  Administered 2016-01-15: 2 g via INTRAVENOUS
  Filled 2016-01-15: qty 50

## 2016-01-15 MED ORDER — POTASSIUM CHLORIDE 10 MEQ/100ML IV SOLN
10.0000 meq | INTRAVENOUS | Status: AC
  Administered 2016-01-15 (×3): 10 meq via INTRAVENOUS
  Filled 2016-01-15 (×3): qty 100

## 2016-01-15 MED ORDER — POTASSIUM CHLORIDE CRYS ER 20 MEQ PO TBCR
40.0000 meq | EXTENDED_RELEASE_TABLET | Freq: Two times a day (BID) | ORAL | Status: DC
  Administered 2016-01-15: 40 meq via ORAL
  Filled 2016-01-15: qty 2

## 2016-01-15 MED ORDER — POTASSIUM CHLORIDE 20 MEQ PO PACK: 40.0000 meq | PACK | Freq: Two times a day (BID) | ORAL | Status: DC

## 2016-01-15 MED ORDER — SODIUM BICARBONATE 650 MG PO TABS: 650.0000 mg | ORAL_TABLET | Freq: Every day | ORAL | Status: DC

## 2016-01-15 MED ORDER — LOPERAMIDE HCL 1 MG/5ML PO LIQD
2.0000 mg | ORAL | Status: DC | PRN
  Filled 2016-01-15: qty 10

## 2016-01-17 NOTE — Discharge Summary (Signed)
[facility] Discharge Summary   Patient: Cindy [name] [id number]   PCP: Vidal Schwalbe, MD DOB: 09/12/1991   Date of admission: 01/13/2016   Date of discharge: 01/15/2016    Discharge Diagnoses:  Principal Problem:   Severe dehydration Active Problems:   Hypokalemia   Nausea with vomiting   FAP (familial adenomatous polyposis)   Nephrolithiasis   Hypocalcemia   Arterial hypotension   Acute renal failure ([place])   Recommendations for Outpatient Follow-up:  1. Please maintain follow-up with nephrology as well as general surgery at [facility] to discuss regarding high output ostomy treatment. May need GI follow-up.   Follow-up Information    Follow up with Vidal Schwalbe, MD. Schedule an appointment as soon as possible for a visit in 1 week.   Specialty:  Family Medicine   Why:  with blood work BMP.    Contact information:   [place] [address] [phone]       Follow up with Claudette Stapler, MD. Go on 01/24/2016.   Specialty:  Internal Medicine   Contact information:   [place] [address] [phone]       Diet recommendation: regular diet  Activity: The patient is advised to gradually reintroduce usual activities.  Discharge Condition: good  History of present illness: As per the H and P dictated on admission, "Ms. Jearld Pies is a 24 year old female with a past medical history significant for FAP s/p ileostomy, arterial hypotension, and nephrolithiasias; who presents with complaints nausea, vomiting, and abdominal pain over the last 3 days. Previously evaluated at the emergency department 3 days ago(1/13). Symptoms started after eating out at a restaurant that she had not been to before. Nothing helped make symptoms better and movement worsened symptoms. Denies any recent sick contacts, fever, dysuria, frequency of urination, or chills. At the emergency department she was evaluated and given IV fluids and  anti-nausea medication and discharged home. Patient states that she felt somewhat better following day but presents today with complaints of lightheadedness, generalized body aches, malaise, and generalized weakness. Patient states that she still feels nauseous but has not vomited so today. Reports that her ostomy bag output has been normal.  Upon admission to the emergency department patient found to be significantly hypotensive with blood pressures as well as 60s /40s and tachycardic with heart rates in the 120s. Patient notes that her systolic blood pressure normally runs in the 80s to 90s. Initial lab work showed sodium 125, potassium 2.8, creatinine 3.48, and BUN 48."  Hospital Course:  Summary of her active problems in the hospital is as following.  Principal Problem:   Severe dehydration Acute kidney injury. Electrolyte imbalance with hypocalcemia and hypokalemia as well as hypomagnesemia. Nausea with vomiting. History of ileostomy with bowel resection due to familial adenomatous polyposis. Chronic arterial hypertension. Metabolic acidosis.  Patient presented with complaints of nausea and vomiting. She was found to be acute having acute kidney injury with electrolyte imbalance and hypotension. She was aggressively hydrated and was monitored in the stepdown unit. Electrolyte imbalance was replaced. Patient did not have any significant output documented although she would occasionally with empty her ileostomy by herself therefore her ins and outs are not accurate. Renal function improved significantly and returned to baseline. Patient will be discharged on oral potassium replacement to take home. Also patient will be provided Imodium which she mentions takes at home on a regular basis. Patient was requested to continue follow-up with general surgery as well as  nephrology to assess whether she has further needs for high output ostomy. Patient has scheduled appointments on 27 this  February, follow-up with nephrology as well as general surgery at [place]. Patient remained hemodynamically stable and her orthostatics negative 2. With this the patient will be safe to be discharged home with close follow-up with above mentioned specialist.  Procedures and Results:  none   Consultations:  none  DISCHARGE MEDICATION: Discharge Medication List as of 01/15/2016  5:27 PM    START taking these medications   Details  loperamide (IMODIUM) 1 MG/5ML solution Take 10 mLs (2 mg total) by mouth as needed for diarrhea or loose stools., Starting 01/15/2016, Until Discontinued, Normal    potassium chloride (KLOR-CON) 20 MEQ packet Take 40 mEq by mouth 2 (two) times daily., Starting 01/15/2016, Until Discontinued, Normal    sodium bicarbonate 650 MG tablet Take 1 tablet (650 mg total) by mouth daily., Starting 01/15/2016, Until Discontinued, Normal      CONTINUE these medications which have NOT CHANGED   Details  !! feeding supplement, RESOURCE BREEZE, (RESOURCE BREEZE) LIQD Take 1 Container by mouth 3 (three) times daily between meals., Starting 05/03/2015, Until Discontinued, Normal    !! lactose free nutrition (BOOST PLUS) LIQD Take 237 mLs by mouth 2 (two) times daily between meals., Starting 09/09/2014, Until Discontinued, Print    Multiple Vitamins-Minerals (ONE DAILY PLUS IRON) TABS Take 1 tablet by mouth daily., Starting 02/17/2015, Until Discontinued, Print    ondansetron (ZOFRAN ODT) 8 MG disintegrating tablet Take 1 tablet (8 mg total) by mouth every 8 (eight) hours as needed for nausea or vomiting., Starting 02/17/2015, Until Discontinued, Print    promethazine (PHENERGAN) 12.5 MG tablet Take 1 tablet (12.5 mg total) by mouth every 6 (six) hours as needed for nausea or vomiting., Starting 12/09/2014, Until Discontinued, Print     !! - Potential duplicate medications found. Please discuss with provider.    STOP taking these medications     potassium chloride  (K-DUR) 10 MEQ tablet        Allergies  Allergen Reactions  . Orange Fruit [Citrus] Hives and Nausea And Vomiting  . Soap Hives and Itching    *Dove Soap*  . Levaquin [Levofloxacin In D5w]     hives  . 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 Soap Hives    "dove" soap only. Patient breaks out in hives//a.[place]  . Levaquin [Levofloxacin] Hives   Discharge Instructions    Diet general    Complete by:  As directed      Discharge instructions    Complete by:  As directed   It is important that you read following instructions as well as go over your medication list with RN to help you understand your care after this hospitalization.  Discharge Instructions: Please follow-up with PCP in one week, please drink plenty of water, 40 oz per day minimum  Please request your primary care physician to go over all Hospital Tests and Procedure/Radiological results at the follow up,  Please get all Hospital records sent to your PCP by signing hospital release before you go home.   You were cared for by a hospitalist during your hospital stay. If you have any questions about your discharge medications or the care you received while you were in the hospital after you are discharged, you can call the unit and ask to speak with the hospitalist on call if the hospitalist that took care of you is not available.  Once you  are discharged, your primary care physician will handle any further medical issues. Please note that NO REFILLS for any discharge medications will be authorized once you are discharged, as it is imperative that you return to your primary care physician (or establish a relationship with a primary care physician if you do not have one) for your aftercare needs so that they can reassess your need for medications and monitor your lab values. You Must read complete instructions/literature along with all the possible adverse reactions/side effects for all the Medicines you take and that have been  prescribed to you. Take any new Medicines after you have completely understood and accept all the possible adverse reactions/side effects. Wear Seat belts while driving. If you have smoked or chewed Tobacco in the last 2 yrs please stop smoking and/or stop any Recreational drug use.     Increase activity slowly    Complete by:  As directed           Discharge Exam: Filed Weights   01/13/16 2007 01/14/16 0616 01/14/16 2207  Weight: 47.174 kg (104 lb) 46.1 kg (101 lb 10.1 oz) 49 kg (108 lb 0.4 oz)   Filed Vitals:   01/15/16 1306 01/15/16 1307  BP: 105/53 106/61  Pulse: 106 96  Temp:    Resp: 16 16   General: Appear in no distress, no Rash; Oral Mucosa moist. Cardiovascular: S1 and S2 Present, no Murmur, no JVD Respiratory: Bilateral Air entry present and Clear to Auscultation, no Crackles, no wheezes Abdomen: Bowel Sound present, Soft and no tenderness Extremities: no Pedal edema, no calf tenderness Neurology: Grossly no focal neuro deficit.  The results of significant diagnostics from this hospitalization (including imaging, microbiology, ancillary and laboratory) are listed below for reference.    Significant Diagnostic Studies: Ct Abdomen Pelvis Wo Contrast  01/11/2016  CLINICAL DATA:  Mid abdominal pain this morning. History of renal stones. Previous history of colon cancer post resection. EXAM: CT ABDOMEN AND PELVIS WITHOUT CONTRAST TECHNIQUE: Multidetector CT imaging of the abdomen and pelvis was performed following the standard protocol without IV contrast. COMPARISON:  None. FINDINGS: The lung bases are clear. There is a 7 mm stone in the right renal pelvis. No hydronephrosis or hydroureter. No additional renal or ureteral stones identified. Bladder is mostly decompressed. No stones identified. No bladder wall thickening. The unenhanced appearance of the liver, spleen, gallbladder, pancreas, adrenal glands, abdominal aorta, inferior vena cava, and retroperitoneal lymph nodes is  unremarkable. Postoperative colectomy with right lower quadrant ileostomy. Stomach and small bowel are not abnormally distended. No bowel wall thickening is appreciated. Contrast material flows through to the colostomy without evidence of obstruction. Abdominal wall musculature appears intact. No free air or free fluid in the abdomen. Pelvis: There is a small amount of free fluid in the pelvis, likely physiologic. Fluid tracks around anterior to the uterus. This is probably free fluid but could represent hydrosalpinx. Uterus and ovaries are not enlarged. No pelvic mass or lymphadenopathy. No destructive bone lesions. IMPRESSION: 7 mm stone in the right renal pelvis without proximal obstruction. Free fluid in the pelvis is likely physiologic. Can't exclude hydrosalpinx. Postoperative colectomy with right lower quadrant ileostomy. Electronically Signed   By: Lucienne Capers M.D.   On: 01/11/2016 01:05   Dg Chest 2 View  01/13/2016  CLINICAL DATA:  Myalgias and weakness.  Tachycardia. EXAM: CHEST  2 VIEW COMPARISON:  07/28/2015 FINDINGS: Normal heart size and mediastinal contours. No acute infiltrate or edema. Symmetric nipple shadows noted. No effusion  or pneumothorax. IMPRESSION: Negative chest. Electronically Signed   By: Monte Fantasia M.D.   On: 01/13/2016 21:44    Microbiology: Recent Results (from the past 240 hour(s))  Urine culture     Status: None   Collection Time: 01/10/16 11:38 PM  Result Value Ref Range Status   Specimen Description URINE, CLEAN CATCH  Final   Special Requests NONE  Final   Culture   Final    5,000 COLONIES/mL INSIGNIFICANT GROWTH Performed at [facility]    Report Status 01/12/2016 FINAL  Final  MRSA PCR Screening     Status: None   Collection Time: 01/14/16  5:00 AM  Result Value Ref Range Status   MRSA by PCR NEGATIVE NEGATIVE Final    Comment:        The GeneXpert MRSA Assay (FDA approved for NASAL specimens only), is one component of  a comprehensive MRSA colonization surveillance program. It is not intended to diagnose MRSA infection nor to guide or monitor treatment for MRSA infections.   C difficile quick scan w PCR reflex     Status: None   Collection Time: 01/14/16  8:23 AM  Result Value Ref Range Status   C Diff antigen NEGATIVE NEGATIVE Final   C Diff toxin NEGATIVE NEGATIVE Final   C Diff interpretation Negative for toxigenic C. difficile  Final     Labs: CBC:  Recent Labs Lab 01/10/16 2150 01/13/16 2100 01/15/16 0558  WBC 11.9* 7.9 3.7*  NEUTROABS 10.4* 5.3 1.9  HGB 13.9 12.7 8.8*  HCT 41.4 36.8 26.5*  MCV 80.7 77.8* 84.7  PLT 386 405* [id number]   Basic Metabolic Panel:  Recent Labs Lab 01/13/16 2100 01/14/16 0145 01/14/16 1700 01/15/16 0558 01/15/16 1415  NA 125* 130* 135 138 136  K 2.8* 3.4* 2.7* 3.0* 3.3*  CL 93* 107 114* 118* 118*  CO2 16* 16* 15* 13* 13*  GLUCOSE 95 102* 83 93 92  BUN 48* 38* 21* 15 11  CREATININE 3.48* 2.66* 1.60* 1.33* 1.32*  CALCIUM 8.8* 7.7* 7.3* 7.6* 7.6*  MG  --  1.8  --  1.2* 2.0  PHOS  --  5.0*  --   --   --    Liver Function Tests:  Recent Labs Lab 01/10/16 2150 01/13/16 2100 01/14/16 1700  AST 28 26 27   ALT 17 16 15   ALKPHOS 125 114 72  BILITOT 1.2 0.8 0.3  PROT 10.2* 9.0* 5.4*  ALBUMIN 5.4* 5.0 3.0*    Recent Labs Lab 01/10/16 2150  LIPASE 35   Time spent: 30 minutes  Signed:  Eleftherios Dudenhoeffer  [facility] 01/15/2016, 8:09 AM

## 2016-07-03 ENCOUNTER — Emergency Department (HOSPITAL_BASED_OUTPATIENT_CLINIC_OR_DEPARTMENT_OTHER): Payer: Medicare Other

## 2016-07-03 ENCOUNTER — Emergency Department (HOSPITAL_BASED_OUTPATIENT_CLINIC_OR_DEPARTMENT_OTHER)
Admission: EM | Admit: 2016-07-03 | Discharge: 2016-07-04 | Disposition: A | Discharge status: Home / Self Care | Payer: Medicare Other | Attending: Emergency Medicine | Admitting: Emergency Medicine

## 2016-07-03 ENCOUNTER — Encounter (HOSPITAL_BASED_OUTPATIENT_CLINIC_OR_DEPARTMENT_OTHER): Payer: Self-pay

## 2016-07-03 DIAGNOSIS — Z85038 Personal history of other malignant neoplasm of large intestine: Secondary | ICD-10-CM | POA: Insufficient documentation

## 2016-07-03 DIAGNOSIS — R109 Unspecified abdominal pain: Secondary | ICD-10-CM | POA: Diagnosis present

## 2016-07-03 DIAGNOSIS — E876 Hypokalemia: Secondary | ICD-10-CM | POA: Diagnosis not present

## 2016-07-03 DIAGNOSIS — Z79899 Other long term (current) drug therapy: Secondary | ICD-10-CM | POA: Insufficient documentation

## 2016-07-03 DIAGNOSIS — N184 Chronic kidney disease, stage 4 (severe): Secondary | ICD-10-CM | POA: Insufficient documentation

## 2016-07-03 LAB — URINE MICROSCOPIC-ADD ON

## 2016-07-03 LAB — BASIC METABOLIC PANEL
ANION GAP: 7 (ref 5–15)
BUN: 21 mg/dL — ABNORMAL HIGH (ref 6–20)
CALCIUM: 8.8 mg/dL — AB (ref 8.9–10.3)
CO2: 18 mmol/L — ABNORMAL LOW (ref 22–32)
Chloride: 105 mmol/L (ref 101–111)
Creatinine, Ser: 1.56 mg/dL — ABNORMAL HIGH (ref 0.44–1.00)
GFR, EST AFRICAN AMERICAN: 52 mL/min — AB (ref >60)
GFR, EST NON AFRICAN AMERICAN: 45 mL/min — AB (ref >60)
Glucose, Bld: 90 mg/dL (ref 65–99)
Potassium: 2.9 mmol/L — ABNORMAL LOW (ref 3.5–5.1)
Sodium: 130 mmol/L — ABNORMAL LOW (ref 135–145)

## 2016-07-03 LAB — PREGNANCY, URINE: Preg Test, Ur: NEGATIVE

## 2016-07-03 LAB — CBC
HCT: 34.1 % — ABNORMAL LOW (ref 36.0–46.0)
HEMOGLOBIN: 11.7 g/dL — AB (ref 12.0–15.0)
MCH: 26.1 pg (ref 26.0–34.0)
MCHC: 34.3 g/dL (ref 30.0–36.0)
MCV: 76.1 fL — ABNORMAL LOW (ref 78.0–100.0)
Platelets: 308 10*3/uL (ref 150–400)
RBC: 4.48 MIL/uL (ref 3.87–5.11)
RDW: 16.2 % — ABNORMAL HIGH (ref 11.5–15.5)
WBC: 8.6 10*3/uL (ref 4.0–10.5)

## 2016-07-03 LAB — URINALYSIS, ROUTINE W REFLEX MICROSCOPIC
Bilirubin Urine: NEGATIVE
GLUCOSE, UA: NEGATIVE mg/dL
KETONES UR: NEGATIVE mg/dL
LEUKOCYTES UA: NEGATIVE
Nitrite: NEGATIVE
PH: 6 (ref 5.0–8.0)
Protein, ur: 100 mg/dL — AB
Specific Gravity, Urine: 1.015 (ref 1.005–1.030)

## 2016-07-03 LAB — MAGNESIUM: Magnesium: 2.3 mg/dL (ref 1.7–2.4)

## 2016-07-03 MED ORDER — POTASSIUM CHLORIDE 20 MEQ/15ML (10%) PO SOLN
40.0000 meq | Freq: Once | ORAL | Status: AC
  Administered 2016-07-03: 40 meq via ORAL
  Filled 2016-07-03: qty 30

## 2016-07-03 MED ORDER — SODIUM CHLORIDE 0.9 % IV SOLN
INTRAVENOUS | Status: DC
  Administered 2016-07-03: 1000 mL via INTRAVENOUS

## 2016-07-03 MED ORDER — KETOROLAC TROMETHAMINE 30 MG/ML IJ SOLN
15.0000 mg | Freq: Once | INTRAMUSCULAR | Status: AC
  Administered 2016-07-03: 15 mg via INTRAVENOUS
  Filled 2016-07-03: qty 1

## 2016-07-03 MED ORDER — MORPHINE SULFATE (PF) 4 MG/ML IV SOLN
4.0000 mg | INTRAVENOUS | Status: DC
  Filled 2016-07-03: qty 1

## 2016-07-03 MED ORDER — POTASSIUM CHLORIDE CRYS ER 20 MEQ PO TBCR
40.0000 meq | EXTENDED_RELEASE_TABLET | Freq: Once | ORAL | Status: DC
  Filled 2016-07-03: qty 2

## 2016-07-03 NOTE — ED Notes (Signed)
Pt states unable to void-gave urine sample at [facility] out pt lab today PTA-did not check into ED at [facility] due to advised of 4 hour wait

## 2016-07-03 NOTE — ED Notes (Signed)
C/o rt flank radiating into rt upper back,  Denies vag dc,  No ua symptoms,  States vomited x 3 yesterday but did not have pain yesterday

## 2016-07-03 NOTE — ED Provider Notes (Addendum)
[place] DEPT [facility] Provider Note   CSN: [id number] Arrival date & time: 07/03/16  1826  First Provider Contact:  First MD Initiated Contact with Patient 07/03/16 2233     By signing my name below, I, Cindy [name], attest that this documentation has been prepared under the direction and in the presence of Orlie Dakin, MD. Electronically Signed: Soijett [name], ED Scribe. 07/03/16. 10:41 PM.    History   Chief Complaint Chief Complaint  Patient presents with  . Flank Pain    HPI  Cindy [name] is a 25 y.o. female with a PMHx of colon CA, stage IV kidney disease, who presents to the Emergency Department complaining of right flank pain onset 8 AM this morning. Pt notes that her right flank pain is mildly resolved at this time. Pt reports that her right flank pain is worsened with position change and alleviated with remaining still. Patient's last menstrual period was 06/05/2016 and was at her baseline.No nausea or vomiting. She states that she has not tried any medications for the relief for her symptoms. She denies abdominal pain and any other symptoms. She feels pain is improved and well controlled after treatment with intravenous Toradol administered prior to my exam. Pain is worse with changing positions improved with remaining still. No other associated symptoms Pt notes that she does not smoke cigarettes or drink ETOH. Pt states that she was diagnosed with colon CA in 2014 with an ileostomy bag placed.    The history is provided by the patient. No language interpreter was used.    Past Medical History:  Diagnosis Date  . Cancer ([place])   . Colon cancer ([place]) 2014   s/p resection and ileostomy  . Colon cancer ([place])   . Complication of anesthesia    "I swallowed the anesthesia when appendix taken out; got pneumonia"  . FAP (familial adenomatous polyposis) 09/30/2014  . Heart murmur   . History of blood transfusion 04/26/2013   "4 bags" (05/21/2013)  . Ileostomy care ([place])     . Iron deficiency anemia   . Nephrolithiasis 09/30/2014  . 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 Pneumonia 2008; 2012  . UTI (lower urinary tract infection)     Patient Active Problem List   Diagnosis Date Noted  . Severe dehydration 01/14/2016  . Acute renal failure ([place]) 01/14/2016  . Hypomagnesemia 07/31/2015  . Normocytic anemia 05/02/2015  . Hyperphosphatemia 05/01/2015  . Arterial hypotension   . Acute-on-chronic kidney injury ([place])   . Gastroenteritis-likely viral 02/16/2015  . Hypocalcemia 10/01/2014  . High output ileostomy ([place]) 09/30/2014  . FAP (familial adenomatous polyposis) 09/30/2014  . Nephrolithiasis 09/30/2014  . Anemia, iron deficiency 12/27/2013  . Vitamin B12 deficiency anemia 12/27/2013  . Dehydration 12/25/2013  . Hyponatremia 12/25/2013  . Metabolic acidosis [id number]  . Adenocarcinoma of colon ([place]) 05/26/2013  . Colon polyps 05/26/2013  . Nausea with vomiting 05/21/2013  . Hypokalemia 04/25/2013    Past Surgical History:  Procedure Laterality Date  . APPENDECTOMY  2008  . APPENDECTOMY    . COLON SURGERY    . COLONOSCOPY N/A 05/24/2013   Procedure: COLONOSCOPY;  Surgeon: Missy Sabins, MD;  Location: [place];  Service: Endoscopy;  Laterality: N/A;  . I&D EXTREMITY Left 11/14/2013   Procedure: IRRIGATION AND DEBRIDEMENT LEFT LONG FINGER;  Surgeon: Tennis Must, MD;  Location: [place];  Service: Orthopedics;  Laterality: Left;  . ILEOSTOMY  06/26/2013  .  ILEOSTOMY    . LEG RECONSTRUCTION USING FASCIAL FLAP  ~ 2009  . still born baby  [birth year]   delivered vaginally @ ~ 24 weeks    OB History    Gravida Para Term Preterm AB Living   1 1 0 1 0     SAB TAB Ectopic Multiple Live Births   0 0 0           Home Medications    Prior to Admission medications   Medication Sig Start Date End Date Taking? Authorizing Provider  Citalopram Hydrobromide (CELEXA PO) Take by mouth.   Yes Historical  Provider, MD  Diphenoxylate-Atropine (LOMOTIL PO) Take by mouth.   Yes Historical Provider, MD  LORazepam (ATIVAN PO) Take by mouth.   Yes Historical Provider, MD  loperamide (IMODIUM) 1 MG/5ML solution Take 10 mLs (2 mg total) by mouth as needed for diarrhea or loose stools. 01/15/16   Lavina Hamman, MD  Multiple Vitamins-Minerals (ONE DAILY PLUS IRON) TABS Take 1 tablet by mouth daily. Patient not taking: Reported on 04/30/2015 02/17/15   Barton Dubois, MD  ondansetron (ZOFRAN ODT) 8 MG disintegrating tablet Take 1 tablet (8 mg total) by mouth every 8 (eight) hours as needed for nausea or vomiting. Patient not taking: Reported on 04/30/2015 02/17/15   Barton Dubois, MD  potassium chloride (KLOR-CON) 20 MEQ packet Take 40 mEq by mouth 2 (two) times daily. 01/15/16   Lavina Hamman, MD  sodium bicarbonate 650 MG tablet Take 1 tablet (650 mg total) by mouth daily. 01/15/16   Lavina Hamman, MD    Family History Family History  Problem Relation Age of Onset  . Adopted: Yes  . ALS Mother   . Alcohol abuse Neg Hx   . Arthritis Neg Hx   . Asthma Neg Hx   . Birth defects Neg Hx   . Cancer Neg Hx   . COPD Neg Hx   . Depression Neg Hx   . Diabetes Neg Hx   . Drug abuse Neg Hx   . Early death Neg Hx   . Hearing loss Neg Hx   . Heart disease Neg Hx   . Hyperlipidemia Neg Hx   . Hypertension Neg Hx   . Kidney disease Neg Hx   . Learning disabilities Neg Hx   . Mental illness Neg Hx   . Mental retardation Neg Hx   . Miscarriages / Stillbirths Neg Hx   . Stroke Neg Hx   . Vision loss Neg Hx     Social History Social History  Substance Use Topics  . Smoking status: Never Smoker  . Smokeless tobacco: Never Used  . Alcohol use No     Allergies   Orange fruit [citrus]; Venofer [ferric oxide]; Soap; Levaquin [levofloxacin in d5w]; Orange fruit [citrus]; Soap; and Levaquin [levofloxacin]   Review of Systems Review of Systems  Constitutional: Negative.   HENT: Negative.   Respiratory:  Negative.   Cardiovascular: Negative.   Gastrointestinal: Positive for vomiting.       Vomited 3 times yesterday before pain started, though no nausea or vomiting today. Ileostomy in place  Genitourinary: Positive for flank pain (right).  Skin: Negative.   Neurological: Negative.   Psychiatric/Behavioral: Negative.   All other systems reviewed and are negative.    Physical Exam Updated Vital Signs BP (!) 88/52 (BP Location: Left Arm)   Pulse 76   Temp 98.1 F (36.7 C) (Oral)   Resp 18   Ht 5'  1" (1.549 m)   Wt 104 lb (47.2 kg)   LMP 06/05/2016   SpO2 100%   BMI 19.65 kg/m   Physical Exam  Constitutional: She appears well-developed and well-nourished.  HENT:  Head: Normocephalic and atraumatic.  Eyes: Conjunctivae are normal. Pupils are equal, round, and reactive to light.  Neck: Neck supple. No tracheal deviation present. No thyromegaly present.  Cardiovascular: Normal rate and regular rhythm.   No murmur heard. Pulmonary/Chest: Effort normal and breath sounds normal.  Abdominal: Soft. Bowel sounds are normal. She exhibits no distension. There is no tenderness.  Ileostomy in place  Genitourinary:  Genitourinary Comments: Right flank tenderness  Musculoskeletal: Normal range of motion. She exhibits no edema or tenderness.  Neurological: She is alert. Coordination normal.  Skin: Skin is warm and dry. No rash noted.  Psychiatric: She has a normal mood and affect.  Nursing note and vitals reviewed.    ED Treatments / Results  DIAGNOSTIC STUDIES: Oxygen Saturation is 100% on RA, nl by my interpretation.    COORDINATION OF CARE: 10:42 PM Discussed treatment plan with pt at bedside which includes UA, CT abdomen pelvis, and labs, and pt agreed to plan.   Labs (all labs ordered are listed, but only abnormal results are displayed) Labs Reviewed  URINALYSIS, ROUTINE W REFLEX MICROSCOPIC (NOT AT [facility]) - Abnormal; Notable for the following:       Result Value    APPearance CLOUDY (*)    Hgb urine dipstick TRACE (*)    Protein, ur 100 (*)    All other components within normal limits  URINE MICROSCOPIC-ADD ON - Abnormal; Notable for the following:    Squamous Epithelial / LPF 0-5 (*)    Bacteria, UA RARE (*)    All other components within normal limits  PREGNANCY, URINE    EKG  EKG Interpretation None       Radiology Ct Renal Stone Study  Result Date: 07/03/2016 CLINICAL DATA:  Acute onset of right flank pain and nausea. Initial encounter. EXAM: CT ABDOMEN AND PELVIS WITHOUT CONTRAST TECHNIQUE: Multidetector CT imaging of the abdomen and pelvis was performed following the standard protocol without IV contrast. COMPARISON:  CT of the abdomen and pelvis from 04/25/2016 FINDINGS: The visualized lung bases are clear. The liver and spleen are unremarkable in appearance. The gallbladder is within normal limits. The pancreas and adrenal glands are unremarkable. A large 1.2 x 1.0 cm stone is again noted at the right renal pelvis, without evidence of hydronephrosis to suggest obstruction at this time. Nonobstructing left renal stones are again noted, measuring up to 3 mm in size. A left renal cyst is seen. The kidneys are otherwise unremarkable. There is no evidence of hydronephrosis. No obstructing ureteral stones are identified. No perinephric stranding is appreciated An ileostomy site is noted at the right lower quadrant. The visualized bowel is grossly unremarkable in appearance. A trace amount of free fluid is seen within the pelvis. This is similar in appearance to May and is likely physiologic. The stomach is within normal limits. No acute vascular abnormalities are seen. The colon is not well characterized. The bladder is mildly distended and grossly unremarkable. The uterus is grossly unremarkable in appearance. No suspicious adnexal masses are seen. No inguinal lymphadenopathy is seen. No acute osseous abnormalities are identified. IMPRESSION: 1. No  evidence of hydronephrosis. 2. Large 1.2 x 1.0 cm stone again noted at the right renal pelvis, without evidence of hydronephrosis to suggest obstruction at this time. Nonobstructing left renal  stones measure up to 3 mm in size. 3. Left renal cyst noted. 4. Right lower quadrant ileostomy site is grossly unremarkable in appearance. 5. Trace free fluid within the pelvis. This is similar in appearance to May and is likely physiologic. Electronically Signed   By: Garald Balding M.D.   On: 07/03/2016 21:28    Procedures Procedures (including critical care time)  Medications Ordered in ED Medications  0.9 %  sodium chloride infusion (1,000 mLs Intravenous New Bag/Given 07/03/16 2204)  ketorolac (TORADOL) 30 MG/ML injection 15 mg (15 mg Intravenous Given 07/03/16 2203)   Results for orders placed or performed during the hospital encounter of 07/03/16  Pregnancy, urine  Result Value Ref Range   Preg Test, Ur NEGATIVE NEGATIVE  Urinalysis, Routine w reflex microscopic (not at [facility])  Result Value Ref Range   Color, Urine YELLOW YELLOW   APPearance CLOUDY (A) CLEAR   Specific Gravity, Urine 1.015 1.005 - 1.030   pH 6.0 5.0 - 8.0   Glucose, UA NEGATIVE NEGATIVE mg/dL   Hgb urine dipstick TRACE (A) NEGATIVE   Bilirubin Urine NEGATIVE NEGATIVE   Ketones, ur NEGATIVE NEGATIVE mg/dL   Protein, ur 100 (A) NEGATIVE mg/dL   Nitrite NEGATIVE NEGATIVE   Leukocytes, UA NEGATIVE NEGATIVE  Urine microscopic-add on  Result Value Ref Range   Squamous Epithelial / LPF 0-5 (A) NONE SEEN   WBC, UA 0-5 0 - 5 WBC/hpf   RBC / HPF 0-5 0 - 5 RBC/hpf   Bacteria, UA RARE (A) NONE SEEN  Basic metabolic panel  Result Value Ref Range   Sodium 130 (L) 135 - 145 mmol/L   Potassium 2.9 (L) 3.5 - 5.1 mmol/L   Chloride 105 101 - 111 mmol/L   CO2 18 (L) 22 - 32 mmol/L   Glucose, Bld 90 65 - 99 mg/dL   BUN 21 (H) 6 - 20 mg/dL   Creatinine, Ser 1.56 (H) 0.44 - 1.00 mg/dL   Calcium 8.8 (L) 8.9 - 10.3 mg/dL   GFR calc non Af  Amer 45 (L) >60 mL/min   GFR calc Af Amer 52 (L) >60 mL/min   Anion gap 7 5 - 15  CBC  Result Value Ref Range   WBC 8.6 4.0 - 10.5 K/uL   RBC 4.48 3.87 - 5.11 MIL/uL   Hemoglobin 11.7 (L) 12.0 - 15.0 g/dL   HCT 34.1 (L) 36.0 - 46.0 %   MCV 76.1 (L) 78.0 - 100.0 fL   MCH 26.1 26.0 - 34.0 pg   MCHC 34.3 30.0 - 36.0 g/dL   RDW 16.2 (H) 11.5 - 15.5 %   Platelets 308 150 - 400 K/uL  Magnesium  Result Value Ref Range   Magnesium 2.3 1.7 - 2.4 mg/dL   Ct Renal Stone Study  Result Date: 07/03/2016 CLINICAL DATA:  Acute onset of right flank pain and nausea. Initial encounter. EXAM: CT ABDOMEN AND PELVIS WITHOUT CONTRAST TECHNIQUE: Multidetector CT imaging of the abdomen and pelvis was performed following the standard protocol without IV contrast. COMPARISON:  CT of the abdomen and pelvis from 04/25/2016 FINDINGS: The visualized lung bases are clear. The liver and spleen are unremarkable in appearance. The gallbladder is within normal limits. The pancreas and adrenal glands are unremarkable. A large 1.2 x 1.0 cm stone is again noted at the right renal pelvis, without evidence of hydronephrosis to suggest obstruction at this time. Nonobstructing left renal stones are again noted, measuring up to 3 mm in size. A left renal  cyst is seen. The kidneys are otherwise unremarkable. There is no evidence of hydronephrosis. No obstructing ureteral stones are identified. No perinephric stranding is appreciated An ileostomy site is noted at the right lower quadrant. The visualized bowel is grossly unremarkable in appearance. A trace amount of free fluid is seen within the pelvis. This is similar in appearance to May and is likely physiologic. The stomach is within normal limits. No acute vascular abnormalities are seen. The colon is not well characterized. The bladder is mildly distended and grossly unremarkable. The uterus is grossly unremarkable in appearance. No suspicious adnexal masses are seen. No inguinal  lymphadenopathy is seen. No acute osseous abnormalities are identified. IMPRESSION: 1. No evidence of hydronephrosis. 2. Large 1.2 x 1.0 cm stone again noted at the right renal pelvis, without evidence of hydronephrosis to suggest obstruction at this time. Nonobstructing left renal stones measure up to 3 mm in size. 3. Left renal cyst noted. 4. Right lower quadrant ileostomy site is grossly unremarkable in appearance. 5. Trace free fluid within the pelvis. This is similar in appearance to May and is likely physiologic. Electronically Signed   By: Garald Balding M.D.   On: 07/03/2016 21:28    Initial Impression / Assessment and Plan / ED Course  I have reviewed the triage vital signs and the nursing notes.  Pertinent labs & imaging results that were available during my care of the patient were reviewed by me and considered in my medical decision making (see chart for details).  Clinical Course   12 midnight patient resting comfortably. She states right flank pain is mild. Requesting Tylenol for pain. Tylenol ordered by me. She is stable for discharge home. Doubt ureteral colic is causing pain. No hydronephrosis. Pain likely muscular in etiology. Patient has long-standing and chronic hypokalemia. Normal magnesium. She was given potassium supplement here. She is advised to serum potassium rechecked by her primary care physician in 7-10 days. She also reports that her blood pressure normally runs in the 80s. She denies any lightheadedness with standing I believe this is normal for her.Old records reviewed. Renal insufficiency is chronic  Final Clinical Impressions(s) / ED Diagnoses   Final diagnoses:  None    New Prescriptions New Prescriptions   No medications on file   Diagnosis #1 right flank pain #2 hypokalemia  #3 chronic renal insufficiency    Orlie Dakin, MD 07/04/16 0004    Orlie Dakin, MD 07/04/16 0005

## 2016-07-03 NOTE — ED Triage Notes (Signed)
Right flank pain, nausea x today-states feels like kidney stone-NAD-steady gait

## 2016-07-04 DIAGNOSIS — E876 Hypokalemia: Secondary | ICD-10-CM | POA: Diagnosis not present

## 2016-07-04 MED ORDER — ACETAMINOPHEN 160 MG/5ML PO SOLN
15.0000 mg/kg | Freq: Once | ORAL | Status: AC
  Administered 2016-07-04: 707.2 mg via ORAL
  Filled 2016-07-04: qty 40.6

## 2016-07-04 NOTE — Discharge Instructions (Signed)
Take Tylenol as directed for pain. Your blood potassium was low tonight at 2.9. You were given a potassium supplement here tonight. Get your blood potassium rechecked at your doctor's office within the next 7-10 days.

## 2016-09-29 ENCOUNTER — Inpatient Hospital Stay (HOSPITAL_COMMUNITY)
Admission: AD | Admit: 2016-09-29 | Discharge: 2016-09-29 | Disposition: A | Discharge status: Home / Self Care | Payer: PRIVATE HEALTH INSURANCE | Source: Ambulatory Visit | Attending: Obstetrics and Gynecology | Admitting: Obstetrics and Gynecology

## 2016-09-29 ENCOUNTER — Encounter (HOSPITAL_COMMUNITY): Payer: Self-pay | Admitting: *Deleted

## 2016-09-29 DIAGNOSIS — Z3202 Encounter for pregnancy test, result negative: Secondary | ICD-10-CM | POA: Diagnosis not present

## 2016-09-29 DIAGNOSIS — N939 Abnormal uterine and vaginal bleeding, unspecified: Secondary | ICD-10-CM

## 2016-09-29 DIAGNOSIS — Z113 Encounter for screening for infections with a predominantly sexual mode of transmission: Secondary | ICD-10-CM | POA: Insufficient documentation

## 2016-09-29 DIAGNOSIS — N920 Excessive and frequent menstruation with regular cycle: Secondary | ICD-10-CM | POA: Diagnosis not present

## 2016-09-29 LAB — URINE MICROSCOPIC-ADD ON: BACTERIA UA: NONE SEEN

## 2016-09-29 LAB — URINALYSIS, ROUTINE W REFLEX MICROSCOPIC
Bilirubin Urine: NEGATIVE
Glucose, UA: NEGATIVE mg/dL
Ketones, ur: NEGATIVE mg/dL
LEUKOCYTES UA: NEGATIVE
NITRITE: NEGATIVE
PH: 6 (ref 5.0–8.0)
Protein, ur: NEGATIVE mg/dL
SPECIFIC GRAVITY, URINE: 1.015 (ref 1.005–1.030)

## 2016-09-29 LAB — WET PREP, GENITAL
Clue Cells Wet Prep HPF POC: NONE SEEN
SPERM: NONE SEEN
Trich, Wet Prep: NONE SEEN
YEAST WET PREP: NONE SEEN

## 2016-09-29 LAB — POCT PREGNANCY, URINE: Preg Test, Ur: NEGATIVE

## 2016-09-29 NOTE — MAU Provider Note (Addendum)
History     CSN: [id number]  Arrival date and time: 09/29/16 1805   First Provider Initiated Contact with Patient 09/29/16 2032    Chief Complaint  Patient presents with  . Vaginal Bleeding   HPI Cindy [name] is a 25 y.o. [id number] female who presents for vaginal bleeding. States her last normal period was 08/04/16. For the last week she has had pink/red spotting but not bleeding like normal for her period. Took pregnancy test at home which was negative. Does not use contraception. Denies abdominal pain, n/v/d, constipation, dysuria, dyspareunia, or postcoital bleeding. Denies history of STI. States she was concerned she may be pregnant.   Pertinent Gynecological History: Menses: regular every 28 days without intermenstrual spotting Contraception: none Sexually transmitted diseases: no past history  Past Medical History:  Diagnosis Date  . Cancer ([place])   . Colon cancer ([place]) 2014   s/p resection and ileostomy  . Colon cancer ([place])   . Complication of anesthesia    "I swallowed the anesthesia when appendix taken out; got pneumonia"  . FAP (familial adenomatous polyposis) 09/30/2014  . Heart murmur   . History of blood transfusion 04/26/2013   "4 bags" (05/21/2013)  . Ileostomy care ([place])   . Iron deficiency anemia   . Nephrolithiasis 09/30/2014  . 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 Pneumonia 2008; 2012  . UTI (lower urinary tract infection)     Past Surgical History:  Procedure Laterality Date  . APPENDECTOMY  2008  . APPENDECTOMY    . COLON SURGERY    . COLONOSCOPY N/A 05/24/2013   Procedure: COLONOSCOPY;  Surgeon: Missy Sabins, MD;  Location: [place];  Service: Endoscopy;  Laterality: N/A;  . I&D EXTREMITY Left 11/14/2013   Procedure: IRRIGATION AND DEBRIDEMENT LEFT LONG FINGER;  Surgeon: Tennis Must, MD;  Location: [place];  Service: Orthopedics;  Laterality: Left;  . ILEOSTOMY  06/26/2013  . ILEOSTOMY    . LEG  RECONSTRUCTION USING FASCIAL FLAP  ~ 2009  . still born baby  [birth year]13   delivered vaginally @ ~ 24 weeks    Family History  Problem Relation Age of Onset  . Adopted: Yes  . ALS Mother   . Alcohol abuse Neg Hx   . Arthritis Neg Hx   . Asthma Neg Hx   . Birth defects Neg Hx   . Cancer Neg Hx   . COPD Neg Hx   . Depression Neg Hx   . Diabetes Neg Hx   . Drug abuse Neg Hx   . Early death Neg Hx   . Hearing loss Neg Hx   . Heart disease Neg Hx   . Hyperlipidemia Neg Hx   . Hypertension Neg Hx   . Kidney disease Neg Hx   . Learning disabilities Neg Hx   . Mental illness Neg Hx   . Mental retardation Neg Hx   . Miscarriages / Stillbirths Neg Hx   . Stroke Neg Hx   . Vision loss Neg Hx     Social History  Substance Use Topics  . Smoking status: Never Smoker  . Smokeless tobacco: Never Used  . Alcohol use No    Allergies:  Allergies  Allergen Reactions  . Venofer [Ferric Oxide] Anaphylaxis  . Soap Hives and Itching    *Dove Soap*  . Food Hives and Other (See Comments)    Pt states that she is allergic to orange juice.   Marland Kitchen  Iron Other (See Comments)    Heart stops  . 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 Soap Hives    "dove" soap only. Patient breaks out in hives//a.[place]  . Levaquin [Levofloxacin] Hives    Prescriptions Prior to Admission  Medication Sig Dispense Refill Last Dose  . citalopram (CELEXA) 10 MG tablet Take 10 mg by mouth at bedtime.   09/28/2016 at Unknown time  . cyanocobalamin (,VITAMIN B-12,) 1000 MCG/ML injection Inject 1,000 mcg into the muscle every 30 (thirty) days.   08/28/2016 at unknown  . loperamide (IMODIUM) 1 MG/5ML solution Take 3 mg by mouth 4 (four) times daily.   09/29/2016 at Unknown time  . LORazepam (ATIVAN) 1 MG tablet Take 1 mg by mouth 3 (three) times daily as needed for anxiety.   09/29/2016 at Unknown time  . magnesium oxide (MAG-OX) 400 (241.3 Mg) MG tablet Take 400 mg by mouth daily.   09/29/2016 at Unknown time  . Multiple  Vitamins-Minerals (MULTIVITAMIN GUMMIES ADULT) CHEW Chew 4 each by mouth daily.   09/29/2016 at Unknown time  . ondansetron (ZOFRAN ODT) 8 MG disintegrating tablet Take 1 tablet (8 mg total) by mouth every 8 (eight) hours as needed for nausea or vomiting. 20 tablet 0 Past Week at Unknown time  . potassium chloride (KLOR-CON) 20 MEQ packet Take 40 mEq by mouth 2 (two) times daily. 50 packet 0 09/29/2016 at Unknown time    Review of Systems  Constitutional: Negative.   Gastrointestinal: Negative.   Genitourinary:       + vaginal bleeding   Physical Exam   Blood pressure 109/88, pulse 94, temperature 98.5 F (36.9 C), temperature source Oral, resp. rate 16, height 5\' 1"  (1.549 m), weight 108 lb (49 kg), last menstrual period 08/04/2016.  Physical Exam  Nursing note and vitals reviewed. Constitutional: She is oriented to person, place, and time. She appears well-developed and well-nourished. No distress.  HENT:  Head: Normocephalic and atraumatic.  Eyes: Conjunctivae are normal. Right eye exhibits no discharge. Left eye exhibits no discharge. No scleral icterus.  Neck: Normal range of motion.  Cardiovascular: Normal rate, regular rhythm and normal heart sounds.   No murmur heard. Respiratory: Effort normal and breath sounds normal. No respiratory distress. She has no wheezes.  GI: Soft. There is no tenderness.  Genitourinary: Uterus normal. Cervix exhibits no motion tenderness and no friability. Left adnexum displays no mass and no tenderness. There is bleeding (small amount of dark red blood appropriate for menses) in the vagina.  Genitourinary Comments: Unable to palpate right adnexa d/t placement of colostomy  Neurological: She is alert and oriented to person, place, and time.  Skin: Skin is warm and dry. She is not diaphoretic.  Psychiatric: She has a normal mood and affect. Her behavior is normal. Judgment and thought content normal.    MAU Course  Procedures Results for orders  placed or performed during the hospital encounter of 09/29/16 (from the past 24 hour(s))  Urinalysis, Routine w reflex microscopic (not at [facility])     Status: Abnormal   Collection Time: 09/29/16  6:25 PM  Result Value Ref Range   Color, Urine YELLOW YELLOW   APPearance CLEAR CLEAR   Specific Gravity, Urine 1.015 1.005 - 1.030   pH 6.0 5.0 - 8.0   Glucose, UA NEGATIVE NEGATIVE mg/dL   Hgb urine dipstick MODERATE (A) NEGATIVE   Bilirubin Urine NEGATIVE NEGATIVE   Ketones, ur NEGATIVE NEGATIVE mg/dL   Protein, ur NEGATIVE NEGATIVE mg/dL  Nitrite NEGATIVE NEGATIVE   Leukocytes, UA NEGATIVE NEGATIVE  Urine microscopic-add on     Status: Abnormal   Collection Time: 09/29/16  6:25 PM  Result Value Ref Range   Squamous Epithelial / LPF 0-5 (A) NONE SEEN   WBC, UA 0-5 0 - 5 WBC/hpf   RBC / HPF 0-5 0 - 5 RBC/hpf   Bacteria, UA NONE SEEN NONE SEEN  Pregnancy, urine POC     Status: None   Collection Time: 09/29/16  6:32 PM  Result Value Ref Range   Preg Test, Ur NEGATIVE NEGATIVE  Wet prep, genital     Status: Abnormal   Collection Time: 09/29/16  9:13 PM  Result Value Ref Range   Yeast Wet Prep HPF POC NONE SEEN NONE SEEN   Trich, Wet Prep NONE SEEN NONE SEEN   Clue Cells Wet Prep HPF POC NONE SEEN NONE SEEN   WBC, Wet Prep HPF POC FEW (A) NONE SEEN   Sperm NONE SEEN     MDM UPT negative GC/CT & wet prep Normal exam; likely onset of menses  Assessment and Plan  A: 1. Negative pregnancy test   2. Vaginal spotting   3. Screen for STD (sexually transmitted disease)    P: Discharge home GC/CT pending F/u with PCP  Jorje Guild 09/29/2016, 8:32 PM

## 2016-09-29 NOTE — MAU Note (Signed)
Spotting for over a week this was not time for her period, had negative pregnancy test, no cramping, vomiting 2 days ago, no diarrhea.

## 2016-09-29 NOTE — Discharge Instructions (Signed)

## 2016-10-02 LAB — GC/CHLAMYDIA PROBE AMP (~~LOC~~) NOT AT ARMC
CHLAMYDIA, DNA PROBE: POSITIVE — AB
NEISSERIA GONORRHEA: NEGATIVE

## 2016-10-03 ENCOUNTER — Telehealth: Payer: Self-pay | Admitting: Student

## 2016-10-03 DIAGNOSIS — A749 Chlamydial infection, unspecified: Secondary | ICD-10-CM

## 2016-10-03 MED ORDER — AZITHROMYCIN 500 MG PO TABS: 1000.0000 mg | ORAL_TABLET | Freq: Once | ORAL | 0 refills | Status: AC

## 2016-10-03 NOTE — Telephone Encounter (Signed)
Pt notified of +chlamydia. Rx sent to pharmacy. Partner needs treatment. No intercourse x 2 weeks after treatment. Pt verbalized understand. Form faxed to [facility].

## 2016-10-31 ENCOUNTER — Encounter (HOSPITAL_BASED_OUTPATIENT_CLINIC_OR_DEPARTMENT_OTHER): Payer: Self-pay | Admitting: Emergency Medicine

## 2016-10-31 ENCOUNTER — Inpatient Hospital Stay (HOSPITAL_BASED_OUTPATIENT_CLINIC_OR_DEPARTMENT_OTHER)
Admission: EM | Admit: 2016-10-31 | Discharge: 2016-11-03 | DRG: 683 | Disposition: A | Discharge status: Home / Self Care | Payer: PRIVATE HEALTH INSURANCE | Attending: Internal Medicine | Admitting: Internal Medicine

## 2016-10-31 ENCOUNTER — Emergency Department (HOSPITAL_BASED_OUTPATIENT_CLINIC_OR_DEPARTMENT_OTHER): Payer: PRIVATE HEALTH INSURANCE

## 2016-10-31 DIAGNOSIS — D126 Benign neoplasm of colon, unspecified: Secondary | ICD-10-CM | POA: Diagnosis present

## 2016-10-31 DIAGNOSIS — R198 Other specified symptoms and signs involving the digestive system and abdomen: Secondary | ICD-10-CM

## 2016-10-31 DIAGNOSIS — N189 Chronic kidney disease, unspecified: Secondary | ICD-10-CM

## 2016-10-31 DIAGNOSIS — C189 Malignant neoplasm of colon, unspecified: Secondary | ICD-10-CM | POA: Diagnosis present

## 2016-10-31 DIAGNOSIS — Z79899 Other long term (current) drug therapy: Secondary | ICD-10-CM

## 2016-10-31 DIAGNOSIS — Z881 Allergy status to other antibiotic agents status: Secondary | ICD-10-CM

## 2016-10-31 DIAGNOSIS — E861 Hypovolemia: Secondary | ICD-10-CM | POA: Diagnosis present

## 2016-10-31 DIAGNOSIS — Z888 Allergy status to other drugs, medicaments and biological substances status: Secondary | ICD-10-CM

## 2016-10-31 DIAGNOSIS — N179 Acute kidney failure, unspecified: Secondary | ICD-10-CM | POA: Diagnosis not present

## 2016-10-31 DIAGNOSIS — E871 Hypo-osmolality and hyponatremia: Secondary | ICD-10-CM | POA: Diagnosis present

## 2016-10-31 DIAGNOSIS — R52 Pain, unspecified: Secondary | ICD-10-CM

## 2016-10-31 DIAGNOSIS — Z932 Ileostomy status: Secondary | ICD-10-CM

## 2016-10-31 DIAGNOSIS — Z91048 Other nonmedicinal substance allergy status: Secondary | ICD-10-CM

## 2016-10-31 DIAGNOSIS — E872 Acidosis: Secondary | ICD-10-CM | POA: Diagnosis present

## 2016-10-31 DIAGNOSIS — N281 Cyst of kidney, acquired: Secondary | ICD-10-CM | POA: Diagnosis present

## 2016-10-31 DIAGNOSIS — N132 Hydronephrosis with renal and ureteral calculous obstruction: Secondary | ICD-10-CM | POA: Diagnosis present

## 2016-10-31 DIAGNOSIS — N390 Urinary tract infection, site not specified: Secondary | ICD-10-CM | POA: Diagnosis present

## 2016-10-31 DIAGNOSIS — E860 Dehydration: Secondary | ICD-10-CM | POA: Diagnosis present

## 2016-10-31 DIAGNOSIS — D1391 Familial adenomatous polyposis: Secondary | ICD-10-CM | POA: Diagnosis present

## 2016-10-31 DIAGNOSIS — K912 Postsurgical malabsorption, not elsewhere classified: Secondary | ICD-10-CM | POA: Diagnosis present

## 2016-10-31 DIAGNOSIS — I959 Hypotension, unspecified: Secondary | ICD-10-CM | POA: Diagnosis not present

## 2016-10-31 DIAGNOSIS — Z91018 Allergy to other foods: Secondary | ICD-10-CM

## 2016-10-31 DIAGNOSIS — Z85038 Personal history of other malignant neoplasm of large intestine: Secondary | ICD-10-CM

## 2016-10-31 DIAGNOSIS — N200 Calculus of kidney: Secondary | ICD-10-CM

## 2016-10-31 DIAGNOSIS — D509 Iron deficiency anemia, unspecified: Secondary | ICD-10-CM | POA: Diagnosis present

## 2016-10-31 LAB — DIFFERENTIAL
BASOS ABS: 0 10*3/uL (ref 0.0–0.1)
BASOS PCT: 0 %
EOS ABS: 0 10*3/uL (ref 0.0–0.7)
Eosinophils Relative: 0 %
Lymphocytes Relative: 16 %
Lymphs Abs: 2.2 10*3/uL (ref 0.7–4.0)
Monocytes Absolute: 0.9 10*3/uL (ref 0.1–1.0)
Monocytes Relative: 7 %
NEUTROS ABS: 10.4 10*3/uL — AB (ref 1.7–7.7)
Neutrophils Relative %: 77 %

## 2016-10-31 LAB — URINALYSIS, MICROSCOPIC (REFLEX)

## 2016-10-31 LAB — CBC
HCT: 38.7 % (ref 36.0–46.0)
Hemoglobin: 13.4 g/dL (ref 12.0–15.0)
MCH: 27.3 pg (ref 26.0–34.0)
MCHC: 34.6 g/dL (ref 30.0–36.0)
MCV: 78.8 fL (ref 78.0–100.0)
PLATELETS: 323 10*3/uL (ref 150–400)
RBC: 4.91 MIL/uL (ref 3.87–5.11)
RDW: 12.9 % (ref 11.5–15.5)
WBC: 13.7 10*3/uL — AB (ref 4.0–10.5)

## 2016-10-31 LAB — COMPREHENSIVE METABOLIC PANEL
ALK PHOS: 76 U/L (ref 38–126)
ALT: 13 U/L — ABNORMAL LOW (ref 14–54)
AST: 43 U/L — AB (ref 15–41)
Albumin: 4.8 g/dL (ref 3.5–5.0)
Anion gap: 12 (ref 5–15)
BILIRUBIN TOTAL: 1.2 mg/dL (ref 0.3–1.2)
BUN: 36 mg/dL — AB (ref 6–20)
CALCIUM: 8.8 mg/dL — AB (ref 8.9–10.3)
CO2: 14 mmol/L — ABNORMAL LOW (ref 22–32)
CREATININE: 4.15 mg/dL — AB (ref 0.44–1.00)
Chloride: 102 mmol/L (ref 101–111)
GFR calc Af Amer: 16 mL/min — ABNORMAL LOW (ref >60)
GFR, EST NON AFRICAN AMERICAN: 14 mL/min — AB (ref >60)
Glucose, Bld: 110 mg/dL — ABNORMAL HIGH (ref 65–99)
POTASSIUM: 4.9 mmol/L (ref 3.5–5.1)
Sodium: 128 mmol/L — ABNORMAL LOW (ref 135–145)
TOTAL PROTEIN: 8.9 g/dL — AB (ref 6.5–8.1)

## 2016-10-31 LAB — URINALYSIS, ROUTINE W REFLEX MICROSCOPIC
BILIRUBIN URINE: NEGATIVE
Glucose, UA: NEGATIVE mg/dL
KETONES UR: NEGATIVE mg/dL
NITRITE: NEGATIVE
PROTEIN: 30 mg/dL — AB
Specific Gravity, Urine: 1.011 (ref 1.005–1.030)
pH: 5.5 (ref 5.0–8.0)

## 2016-10-31 LAB — LIPASE, BLOOD: Lipase: 40 U/L (ref 11–51)

## 2016-10-31 MED ORDER — FENTANYL CITRATE (PF) 100 MCG/2ML IJ SOLN
50.0000 ug | Freq: Once | INTRAMUSCULAR | Status: AC
  Administered 2016-10-31: 50 ug via INTRAVENOUS
  Filled 2016-10-31: qty 2

## 2016-10-31 MED ORDER — ONDANSETRON HCL 4 MG/2ML IJ SOLN
4.0000 mg | Freq: Once | INTRAMUSCULAR | Status: AC
Start: 2016-10-31 — End: 2016-10-31
  Administered 2016-10-31: 4 mg via INTRAVENOUS
  Filled 2016-10-31: qty 2

## 2016-10-31 MED ORDER — DICYCLOMINE HCL 10 MG/ML IM SOLN
20.0000 mg | Freq: Once | INTRAMUSCULAR | Status: AC
  Administered 2016-10-31: 20 mg via INTRAMUSCULAR
  Filled 2016-10-31: qty 2

## 2016-10-31 MED ORDER — SODIUM CHLORIDE 0.9 % IV BOLUS (SEPSIS)
1000.0000 mL | Freq: Once | INTRAVENOUS | Status: AC
  Administered 2016-10-31: 1000 mL via INTRAVENOUS

## 2016-10-31 MED ORDER — ONDANSETRON HCL 4 MG/2ML IJ SOLN
4.0000 mg | Freq: Once | INTRAMUSCULAR | Status: AC
  Administered 2016-10-31: 4 mg via INTRAVENOUS
  Filled 2016-10-31: qty 2

## 2016-10-31 MED ORDER — MORPHINE SULFATE (PF) 4 MG/ML IV SOLN: 4.0000 mg | Freq: Once | INTRAVENOUS | Status: DC

## 2016-10-31 NOTE — ED Provider Notes (Signed)
[facility] Provider Note   CSN: [id number] Arrival date & time: 10/31/16  1914  By signing my name below, I, Cindy [name], attest that this documentation has been prepared under the direction and in the presence of Pearlie Oyster, PA-C Electronically Signed: Soijett [name], ED Scribe. 10/31/16. 8:38 PM.  History   Chief Complaint Chief Complaint  Patient presents with  . Emesis    HPI Cindy [name] is a 25 y.o. female with a PMHx of colon CA, who presents to the Emergency Department complaining of worsening left-sided abdominal pain, nausea and emesis x 2 days. Pt states that she was recently diagnosed with an UTI to which she was treated with bactrim beginning 4 days ago. Pt reports that since treatment of her UTI, her urinary symptoms have resolved. Pt reports that prior to vomiting, she will have cramping to her abdomen. She states that she is having associated symptoms of nausea, vomiting, and stabbing generalized abdominal pain. She states that she has tried zofran with no relief for her symptoms. She denies fever, back pain, blood in stool, chills, CP, SOB, and any other symptoms.   The history is provided by the patient. No language interpreter was used.    Past Medical History:  Diagnosis Date  . Cancer ([place])   . Colon cancer ([place]) 2014   s/p resection and ileostomy  . Colon cancer ([place])   . Complication of anesthesia    "I swallowed the anesthesia when appendix taken out; got pneumonia"  . FAP (familial adenomatous polyposis) 09/30/2014  . Heart murmur   . History of blood transfusion 04/26/2013   "4 bags" (05/21/2013)  . Ileostomy care ([place])   . Iron deficiency anemia   . Nephrolithiasis 09/30/2014  . 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 Pneumonia 2008; 2012  . UTI (lower urinary tract infection)     Patient Active Problem List   Diagnosis Date Noted  . AKI (acute kidney injury) ([place]) 11/01/2016  . Severe dehydration  01/14/2016  . Acute renal failure ([place]) 01/14/2016  . Hypomagnesemia 07/31/2015  . Normocytic anemia 05/02/2015  . Hyperphosphatemia 05/01/2015  . Arterial hypotension   . Acute-on-chronic kidney injury ([place])   . Gastroenteritis-likely viral 02/16/2015  . Hypocalcemia 10/01/2014  . High output ileostomy ([place]) 09/30/2014  . FAP (familial adenomatous polyposis) 09/30/2014  . Nephrolithiasis 09/30/2014  . Anemia, iron deficiency 12/27/2013  . Vitamin B12 deficiency anemia 12/27/2013  . Dehydration 12/25/2013  . Hyponatremia 12/25/2013  . Metabolic acidosis 07/10/4817  . Adenocarcinoma of colon ([place]) 05/26/2013  . Colon polyps 05/26/2013  . Nausea with vomiting 05/21/2013  . Hypokalemia 04/25/2013    Past Surgical History:  Procedure Laterality Date  . APPENDECTOMY  2008  . APPENDECTOMY    . COLON SURGERY    . COLONOSCOPY N/A 05/24/2013   Procedure: COLONOSCOPY;  Surgeon: Missy Sabins, MD;  Location: [facility];  Service: Endoscopy;  Laterality: N/A;  . I&D EXTREMITY Left 11/14/2013   Procedure: IRRIGATION AND DEBRIDEMENT LEFT LONG FINGER;  Surgeon: Tennis Must, MD;  Location: [facility];  Service: Orthopedics;  Laterality: Left;  . ILEOSTOMY  06/26/2013  . ILEOSTOMY    . LEG RECONSTRUCTION USING FASCIAL FLAP  ~ 2009  . still born baby  [birth year]   delivered vaginally @ ~ 24 weeks    OB History    Gravida Para Term Preterm AB Living   1 1 0 1 0  SAB TAB Ectopic Multiple Live Births   0 0 0           Home Medications    Prior to Admission medications   Medication Sig Start Date End Date Taking? Authorizing Provider  citalopram (CELEXA) 10 MG tablet Take 10 mg by mouth at bedtime.    Historical Provider, MD  cyanocobalamin (,VITAMIN B-12,) 1000 MCG/ML injection Inject 1,000 mcg into the muscle every 30 (thirty) days.    Historical Provider, MD  loperamide (IMODIUM) 1 MG/5ML solution Take 3 mg by mouth 4 (four) times daily.    Historical Provider, MD    LORazepam (ATIVAN) 1 MG tablet Take 1 mg by mouth 3 (three) times daily as needed for anxiety.    Historical Provider, MD  magnesium oxide (MAG-OX) 400 (241.3 Mg) MG tablet Take 400 mg by mouth daily.    Historical Provider, MD  Multiple Vitamins-Minerals (MULTIVITAMIN GUMMIES ADULT) CHEW Chew 4 each by mouth daily.    Historical Provider, MD  ondansetron (ZOFRAN ODT) 8 MG disintegrating tablet Take 1 tablet (8 mg total) by mouth every 8 (eight) hours as needed for nausea or vomiting. 02/17/15   Barton Dubois, MD  potassium chloride (KLOR-CON) 20 MEQ packet Take 40 mEq by mouth 2 (two) times daily. 01/15/16   Lavina Hamman, MD    Family History Family History  Problem Relation Age of Onset  . Adopted: Yes  . ALS Mother   . Alcohol abuse Neg Hx   . Arthritis Neg Hx   . Asthma Neg Hx   . Birth defects Neg Hx   . Cancer Neg Hx   . COPD Neg Hx   . Depression Neg Hx   . Diabetes Neg Hx   . Drug abuse Neg Hx   . Early death Neg Hx   . Hearing loss Neg Hx   . Heart disease Neg Hx   . Hyperlipidemia Neg Hx   . Hypertension Neg Hx   . Kidney disease Neg Hx   . Learning disabilities Neg Hx   . Mental illness Neg Hx   . Mental retardation Neg Hx   . Miscarriages / Stillbirths Neg Hx   . Stroke Neg Hx   . Vision loss Neg Hx     Social History Social History  Substance Use Topics  . Smoking status: Never Smoker  . Smokeless tobacco: Never Used  . Alcohol use No     Allergies   Venofer [ferric oxide]; Soap; Food; Iron; and Levaquin [levofloxacin]   Review of Systems Review of Systems  Constitutional: Negative for chills and fever.  Respiratory: Negative for shortness of breath.   Cardiovascular: Negative for chest pain.  Gastrointestinal: Positive for abdominal pain, nausea and vomiting. Negative for blood in stool.  Genitourinary: Negative for dysuria.  Musculoskeletal: Negative for back pain.     Physical Exam Updated Vital Signs BP (!) 86/56   Pulse 78   Temp 97.8  F (36.6 C) (Oral)   Resp 14   Ht 5\' 1"  (1.549 m)   Wt 48.1 kg   LMP 10/04/2016   SpO2 100%   BMI 20.03 kg/m   Physical Exam  Constitutional: She is oriented to person, place, and time. She appears well-developed and well-nourished. No distress.  HENT:  Head: Normocephalic and atraumatic.  Cardiovascular: Normal rate, regular rhythm and normal heart sounds.   No murmur heard. Pulmonary/Chest: Effort normal and breath sounds normal. No respiratory distress.  Abdominal: Soft. She exhibits no distension. There  is generalized tenderness. There is no rebound, no guarding and no CVA tenderness.  Ileostomy to right lower abdomen with non-bloody loose contents. No CVA tenderness. Left-sided abdominal tenderness. No rebound or guarding.  Musculoskeletal: She exhibits no edema.  Neurological: She is alert and oriented to person, place, and time.  Skin: Skin is warm and dry.  Nursing note and vitals reviewed.    ED Treatments / Results  DIAGNOSTIC STUDIES: Oxygen Saturation is 100% on RA, nl by my interpretation.    COORDINATION OF CARE: 8:35 PM Discussed treatment plan with pt at bedside which includes UA, labs, bentyl and pt agreed to plan.  9:45 PM- Pt re-evaluated and her pain wasn't controlled with pain medications. Will give the pt fentanyl for better pain control.   Labs (all labs ordered are listed, but only abnormal results are displayed) Labs Reviewed  COMPREHENSIVE METABOLIC PANEL - Abnormal; Notable for the following:       Result Value   Sodium 128 (*)    CO2 14 (*)    Glucose, Bld 110 (*)    BUN 36 (*)    Creatinine, Ser 4.15 (*)    Calcium 8.8 (*)    Total Protein 8.9 (*)    AST 43 (*)    ALT 13 (*)    GFR calc non Af Amer 14 (*)    GFR calc Af Amer 16 (*)    All other components within normal limits  CBC - Abnormal; Notable for the following:    WBC 13.7 (*)    All other components within normal limits  URINALYSIS, ROUTINE W REFLEX MICROSCOPIC - Abnormal;  Notable for the following:    APPearance CLOUDY (*)    Hgb urine dipstick MODERATE (*)    Protein, ur 30 (*)    Leukocytes, UA SMALL (*)    All other components within normal limits  DIFFERENTIAL - Abnormal; Notable for the following:    Neutro Abs 10.4 (*)    All other components within normal limits  URINALYSIS, MICROSCOPIC (REFLEX) - Abnormal; Notable for the following:    Bacteria, UA MANY (*)    Squamous Epithelial / LPF 6-30 (*)    All other components within normal limits  LIPASE, BLOOD    Radiology Ct Abdomen Pelvis Wo Contrast  Result Date: 10/31/2016 CLINICAL DATA:  Nausea, vomiting, left-sided abdominal pain x5 days. Recent urinary tract infection. History of colon cancer with partial colectomy, ileostomy and appendectomy. EXAM: CT ABDOMEN AND PELVIS WITHOUT CONTRAST TECHNIQUE: Multidetector CT imaging of the abdomen and pelvis was performed following the standard protocol without IV contrast. COMPARISON:  07/03/2016 FINDINGS: Lower chest: No acute abnormality. Hepatobiliary: No focal liver abnormality is seen. No gallstones, gallbladder wall thickening, or biliary dilatation. Pancreas: Unremarkable. No pancreatic ductal dilatation or surrounding inflammatory changes. Spleen: Normal in size without focal abnormality. Adrenals/Urinary Tract: Normal bilateral adrenal glands. Unchanged 1 x 1.1 x 0.9 cm right renal pelvic stone with mild hydronephrosis. Nonobstructing interpolar and left lower pole renal calculi are identified with mild left-sided hydronephrosis since previous exam. No obstructing calculus is seen. Left interpolar posterior subtle hypodensity measuring 1 cm in is unchanged likely to reflect a cyst. There is a punctate density within the bladder possibly representing a passed stone, series 2, image 86. Stomach/Bowel: Right lower quadrant ileostomy. Partial colectomy on the right. No bowel obstruction or inflammation. Vascular/Lymphatic: No significant vascular findings are  present. No enlarged abdominal or pelvic lymph nodes. Reproductive: Uterus and bilateral adnexa are unremarkable. Other:  Stable pelvic calcifications consistent with phleboliths. Small amount of free fluid in cul-de-sac. Musculoskeletal: No acute abnormality IMPRESSION: 1. 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 Nonobstructing interpolar and lower pole left-sided renal calculi with mild hydronephrosis. Punctate calcification is is suggested within the bladder which may represent a recently passed stone. Stable left renal cyst. 2. No acute bowel inflammation. Right lower quadrant ileostomy partial right-sided colectomy. Electronically Signed   By: Ashley Royalty M.D.   On: 10/31/2016 23:24    Procedures Procedures (including critical care time)  Medications Ordered in ED Medications  ondansetron (ZOFRAN) injection 4 mg (not administered)  fentaNYL (SUBLIMAZE) injection 50 mcg (not administered)  dicyclomine (BENTYL) injection 20 mg (20 mg Intramuscular Given 10/31/16 2059)  ondansetron (ZOFRAN) injection 4 mg (4 mg Intravenous Given 10/31/16 2056)  sodium chloride 0.9 % bolus 1,000 mL (0 mLs Intravenous Stopped 10/31/16 2153)  sodium chloride 0.9 % bolus 1,000 mL (0 mLs Intravenous Stopped 10/31/16 2351)  fentaNYL (SUBLIMAZE) injection 50 mcg (50 mcg Intravenous Given 10/31/16 2149)  ondansetron (ZOFRAN) injection 4 mg (4 mg Intravenous Given 10/31/16 2159)     Initial Impression / Assessment and Plan / ED Course  I have reviewed the triage vital signs and the nursing notes.  Pertinent labs & imaging results that were available during my care of the patient were reviewed by me and considered in my medical decision making (see chart for details).  Clinical Course    WINDI TORO is a 25 y.o. female who presents to ED for abdominal pain, n/v x 2 days. Diagnosed with UTI and taking bactrim as directed  x 4 days. Urinary symptoms have resolved. Afebrile with left-sided abdominal tenderness, no rebound or guarding. Appears dry - fluids given.   The patient is noted to have a SBP's <90. With the current information available to me, I don't think the patient is in septic shock. The SBP's <90, is related to normal for the patient.. Chart review with multiple BP's in similar range. 84/60 at last office visit.   Labs significant for AKI - baseline Cr around 1.3 and Cr today of 4.15 CT reviewed:  IMPRESSION: 1. 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 Nonobstructing interpolar and lower pole left-sided renal calculi with mild hydronephrosis. Punctate calcification is is suggested within the bladder which may represent a recently passed stone. Stable left renal cyst.  2. No acute bowel inflammation. Right lower quadrant ileostomy partial right-sided colectomy.  Hospitalist consulted, Dr. Loleta Books, who will admit. Urology consulted who recommends admission at [facility]. Recommends consulting urology to see patient if AKI does not improve with hydration.   Patient discussed with Dr. Laverta [place] who agrees with treatment plan.   Final Clinical Impressions(s) / ED Diagnoses   Final diagnoses:  AKI (acute kidney injury) ([place])    New Prescriptions New Prescriptions   No medications on file   I personally performed the services described in this documentation, which was scribed in my presence. The recorded information has been reviewed and is accurate.     [facility] Siana Panameno, PA-C 11/01/16 8325    Margette Fast, MD 11/01/16 [phone]

## 2016-10-31 NOTE — ED Notes (Signed)
Pt ambulated to restroom without assistance.  She states she urinated a normal amount and was able to provide a urine sample.

## 2016-10-31 NOTE — ED Triage Notes (Signed)
Patient was treated with for a UTI on Friday with bactrim. Since the patient reports that she is having N/V/ and abdominal pain

## 2016-10-31 NOTE — ED Notes (Signed)
Pt unable to tolerate PO contrast despite nausea medication

## 2016-10-31 NOTE — ED Notes (Signed)
Pt states she saw her GYN on Friday and was put on Keflex for a UTI.  Pt states that yesterday she started having generalized stomach cramping with everything she was eating and vomiting.  She states she has been able to hold fluids down though.  Pt denies flank pain, denies fever.

## 2016-10-31 NOTE — ED Notes (Signed)
Patient transported to CT 

## 2016-11-01 ENCOUNTER — Encounter (HOSPITAL_COMMUNITY)
Admission: EM | Disposition: A | Discharge status: Home / Self Care | Payer: Self-pay | Source: Home / Self Care | Attending: Internal Medicine

## 2016-11-01 ENCOUNTER — Inpatient Hospital Stay (HOSPITAL_COMMUNITY): Payer: PRIVATE HEALTH INSURANCE | Admitting: Anesthesiology

## 2016-11-01 ENCOUNTER — Encounter (HOSPITAL_COMMUNITY): Payer: Self-pay

## 2016-11-01 DIAGNOSIS — Z932 Ileostomy status: Secondary | ICD-10-CM | POA: Diagnosis not present

## 2016-11-01 DIAGNOSIS — N179 Acute kidney failure, unspecified: Secondary | ICD-10-CM | POA: Diagnosis present

## 2016-11-01 DIAGNOSIS — E871 Hypo-osmolality and hyponatremia: Secondary | ICD-10-CM | POA: Diagnosis present

## 2016-11-01 DIAGNOSIS — Z91048 Other nonmedicinal substance allergy status: Secondary | ICD-10-CM | POA: Diagnosis not present

## 2016-11-01 DIAGNOSIS — E860 Dehydration: Secondary | ICD-10-CM | POA: Diagnosis present

## 2016-11-01 DIAGNOSIS — R198 Other specified symptoms and signs involving the digestive system and abdomen: Secondary | ICD-10-CM | POA: Diagnosis not present

## 2016-11-01 DIAGNOSIS — E872 Acidosis: Secondary | ICD-10-CM | POA: Diagnosis not present

## 2016-11-01 DIAGNOSIS — Z85038 Personal history of other malignant neoplasm of large intestine: Secondary | ICD-10-CM | POA: Diagnosis not present

## 2016-11-01 DIAGNOSIS — Z91018 Allergy to other foods: Secondary | ICD-10-CM | POA: Diagnosis not present

## 2016-11-01 DIAGNOSIS — N200 Calculus of kidney: Secondary | ICD-10-CM | POA: Diagnosis not present

## 2016-11-01 DIAGNOSIS — N281 Cyst of kidney, acquired: Secondary | ICD-10-CM | POA: Diagnosis present

## 2016-11-01 DIAGNOSIS — N390 Urinary tract infection, site not specified: Secondary | ICD-10-CM | POA: Diagnosis present

## 2016-11-01 DIAGNOSIS — D509 Iron deficiency anemia, unspecified: Secondary | ICD-10-CM | POA: Diagnosis not present

## 2016-11-01 DIAGNOSIS — Z881 Allergy status to other antibiotic agents status: Secondary | ICD-10-CM | POA: Diagnosis not present

## 2016-11-01 DIAGNOSIS — N132 Hydronephrosis with renal and ureteral calculous obstruction: Secondary | ICD-10-CM | POA: Diagnosis present

## 2016-11-01 DIAGNOSIS — Z79899 Other long term (current) drug therapy: Secondary | ICD-10-CM | POA: Diagnosis not present

## 2016-11-01 DIAGNOSIS — R52 Pain, unspecified: Secondary | ICD-10-CM | POA: Diagnosis present

## 2016-11-01 DIAGNOSIS — I9589 Other hypotension: Secondary | ICD-10-CM | POA: Diagnosis not present

## 2016-11-01 DIAGNOSIS — D126 Benign neoplasm of colon, unspecified: Secondary | ICD-10-CM | POA: Diagnosis not present

## 2016-11-01 DIAGNOSIS — Z888 Allergy status to other drugs, medicaments and biological substances status: Secondary | ICD-10-CM | POA: Diagnosis not present

## 2016-11-01 DIAGNOSIS — K912 Postsurgical malabsorption, not elsewhere classified: Secondary | ICD-10-CM | POA: Diagnosis present

## 2016-11-01 DIAGNOSIS — C189 Malignant neoplasm of colon, unspecified: Secondary | ICD-10-CM | POA: Diagnosis not present

## 2016-11-01 DIAGNOSIS — I959 Hypotension, unspecified: Secondary | ICD-10-CM | POA: Diagnosis not present

## 2016-11-01 HISTORY — PX: CYSTOSCOPY W/ URETERAL STENT PLACEMENT: SHX1429

## 2016-11-01 LAB — CBC
HCT: 35.4 % — ABNORMAL LOW (ref 36.0–46.0)
Hemoglobin: 11.8 g/dL — ABNORMAL LOW (ref 12.0–15.0)
MCH: 27.1 pg (ref 26.0–34.0)
MCHC: 33.3 g/dL (ref 30.0–36.0)
MCV: 81.4 fL (ref 78.0–100.0)
PLATELETS: 250 10*3/uL (ref 150–400)
RBC: 4.35 MIL/uL (ref 3.87–5.11)
RDW: 13.1 % (ref 11.5–15.5)
WBC: 8.4 10*3/uL (ref 4.0–10.5)

## 2016-11-01 LAB — URINALYSIS, ROUTINE W REFLEX MICROSCOPIC
BILIRUBIN URINE: NEGATIVE
Glucose, UA: NEGATIVE mg/dL
HGB URINE DIPSTICK: NEGATIVE
Ketones, ur: NEGATIVE mg/dL
Leukocytes, UA: NEGATIVE
Nitrite: NEGATIVE
PROTEIN: NEGATIVE mg/dL
Specific Gravity, Urine: 1.009 (ref 1.005–1.030)
pH: 5 (ref 5.0–8.0)

## 2016-11-01 LAB — CREATININE, SERUM
CREATININE: 2.79 mg/dL — AB (ref 0.44–1.00)
GFR calc non Af Amer: 22 mL/min — ABNORMAL LOW (ref >60)
GFR, EST AFRICAN AMERICAN: 26 mL/min — AB (ref >60)

## 2016-11-01 SURGERY — CYSTOSCOPY, WITH RETROGRADE PYELOGRAM AND URETERAL STENT INSERTION
Anesthesia: General | Site: Urethra | Laterality: Bilateral

## 2016-11-01 MED ORDER — IOHEXOL 300 MG/ML  SOLN
INTRAMUSCULAR | Status: DC | PRN
  Administered 2016-11-01: 14 mL

## 2016-11-01 MED ORDER — ACETAMINOPHEN 325 MG PO TABS: 650.0000 mg | ORAL_TABLET | Freq: Four times a day (QID) | ORAL | Status: DC | PRN

## 2016-11-01 MED ORDER — FENTANYL CITRATE (PF) 100 MCG/2ML IJ SOLN
50.0000 ug | Freq: Once | INTRAMUSCULAR | Status: AC
  Administered 2016-11-01: 50 ug via INTRAVENOUS
  Filled 2016-11-01: qty 2

## 2016-11-01 MED ORDER — ONDANSETRON HCL 4 MG/2ML IJ SOLN
4.0000 mg | Freq: Four times a day (QID) | INTRAMUSCULAR | Status: DC | PRN
  Administered 2016-11-01 (×2): 4 mg via INTRAVENOUS
  Filled 2016-11-01 (×5): qty 2

## 2016-11-01 MED ORDER — SODIUM CHLORIDE 0.9 % IR SOLN
Status: DC | PRN
  Administered 2016-11-01: 3000 mL via INTRAVESICAL

## 2016-11-01 MED ORDER — ACETAMINOPHEN 650 MG RE SUPP: 650.0000 mg | Freq: Four times a day (QID) | RECTAL | Status: DC | PRN

## 2016-11-01 MED ORDER — HYDROMORPHONE HCL 1 MG/ML IJ SOLN
INTRAMUSCULAR | Status: AC
  Filled 2016-11-01: qty 0.5

## 2016-11-01 MED ORDER — HYDROMORPHONE HCL 1 MG/ML IJ SOLN
INTRAMUSCULAR | Status: AC
  Administered 2016-11-01: 0.5 mg via INTRAVENOUS
  Filled 2016-11-01: qty 0.5

## 2016-11-01 MED ORDER — PHENYLEPHRINE 40 MCG/ML (10ML) SYRINGE FOR IV PUSH (FOR BLOOD PRESSURE SUPPORT)
PREFILLED_SYRINGE | INTRAVENOUS | Status: DC | PRN
  Administered 2016-11-01 (×3): 80 ug via INTRAVENOUS

## 2016-11-01 MED ORDER — FENTANYL CITRATE (PF) 100 MCG/2ML IJ SOLN
INTRAMUSCULAR | Status: DC | PRN
  Administered 2016-11-01: 50 ug via INTRAVENOUS
  Administered 2016-11-01 (×2): 25 ug via INTRAVENOUS

## 2016-11-01 MED ORDER — ONDANSETRON HCL 4 MG/2ML IJ SOLN
4.0000 mg | Freq: Four times a day (QID) | INTRAMUSCULAR | Status: DC | PRN
  Administered 2016-11-01 – 2016-11-02 (×4): 4 mg via INTRAVENOUS
  Filled 2016-11-01 (×2): qty 2

## 2016-11-01 MED ORDER — MAGNESIUM OXIDE 400 (241.3 MG) MG PO TABS
400.0000 mg | ORAL_TABLET | Freq: Every day | ORAL | Status: DC
  Administered 2016-11-02 – 2016-11-03 (×2): 400 mg via ORAL
  Filled 2016-11-01 (×2): qty 1

## 2016-11-01 MED ORDER — HYDROCODONE-ACETAMINOPHEN 5-325 MG PO TABS
1.0000 | ORAL_TABLET | ORAL | Status: DC | PRN
  Administered 2016-11-02: 2 via ORAL
  Administered 2016-11-03: 1 via ORAL
  Filled 2016-11-01: qty 1
  Filled 2016-11-01: qty 2

## 2016-11-01 MED ORDER — LIDOCAINE 2% (20 MG/ML) 5 ML SYRINGE
INTRAMUSCULAR | Status: DC | PRN
  Administered 2016-11-01: 60 mg via INTRAVENOUS

## 2016-11-01 MED ORDER — SODIUM CHLORIDE 0.9 % IV SOLN
INTRAVENOUS | Status: DC
  Administered 2016-11-01 – 2016-11-02 (×4): via INTRAVENOUS

## 2016-11-01 MED ORDER — PROMETHAZINE HCL 25 MG/ML IJ SOLN: 6.2500 mg | INTRAMUSCULAR | Status: DC | PRN

## 2016-11-01 MED ORDER — DEXAMETHASONE SODIUM PHOSPHATE 10 MG/ML IJ SOLN
INTRAMUSCULAR | Status: AC
  Filled 2016-11-01: qty 1

## 2016-11-01 MED ORDER — ONDANSETRON HCL 4 MG/2ML IJ SOLN
INTRAMUSCULAR | Status: AC
  Filled 2016-11-01: qty 2

## 2016-11-01 MED ORDER — POTASSIUM CHLORIDE 20 MEQ PO PACK
40.0000 meq | PACK | Freq: Two times a day (BID) | ORAL | Status: DC
  Filled 2016-11-01: qty 2

## 2016-11-01 MED ORDER — MIDAZOLAM HCL 5 MG/5ML IJ SOLN
INTRAMUSCULAR | Status: DC | PRN
  Administered 2016-11-01: 2 mg via INTRAVENOUS

## 2016-11-01 MED ORDER — CITALOPRAM HYDROBROMIDE 20 MG PO TABS
10.0000 mg | ORAL_TABLET | Freq: Every day | ORAL | Status: DC
  Administered 2016-11-01 – 2016-11-02 (×2): 10 mg via ORAL
  Filled 2016-11-01 (×2): qty 1

## 2016-11-01 MED ORDER — HEPARIN SODIUM (PORCINE) 5000 UNIT/ML IJ SOLN
5000.0000 [IU] | Freq: Three times a day (TID) | INTRAMUSCULAR | Status: DC
  Administered 2016-11-01 – 2016-11-03 (×6): 5000 [IU] via SUBCUTANEOUS
  Filled 2016-11-01 (×6): qty 1

## 2016-11-01 MED ORDER — PROPOFOL 10 MG/ML IV BOLUS
INTRAVENOUS | Status: AC
  Filled 2016-11-01: qty 20

## 2016-11-01 MED ORDER — DEXTROSE 5 % IV SOLN
1.0000 g | INTRAVENOUS | Status: DC
  Administered 2016-11-01 – 2016-11-02 (×2): 1 g via INTRAVENOUS
  Filled 2016-11-01 (×3): qty 10

## 2016-11-01 MED ORDER — SODIUM CHLORIDE 0.9 % IV SOLN
INTRAVENOUS | Status: DC
  Administered 2016-11-01 (×2): via INTRAVENOUS

## 2016-11-01 MED ORDER — PHENYLEPHRINE 40 MCG/ML (10ML) SYRINGE FOR IV PUSH (FOR BLOOD PRESSURE SUPPORT)
PREFILLED_SYRINGE | INTRAVENOUS | Status: AC
  Filled 2016-11-01: qty 10

## 2016-11-01 MED ORDER — LORAZEPAM 1 MG PO TABS
1.0000 mg | ORAL_TABLET | Freq: Three times a day (TID) | ORAL | Status: DC | PRN
  Administered 2016-11-01 – 2016-11-02 (×2): 1 mg via ORAL
  Filled 2016-11-01 (×2): qty 1

## 2016-11-01 MED ORDER — FENTANYL CITRATE (PF) 100 MCG/2ML IJ SOLN
INTRAMUSCULAR | Status: AC
  Filled 2016-11-01: qty 2

## 2016-11-01 MED ORDER — LACTATED RINGERS IV SOLN: INTRAVENOUS | Status: DC

## 2016-11-01 MED ORDER — POTASSIUM CHLORIDE CRYS ER 20 MEQ PO TBCR
40.0000 meq | EXTENDED_RELEASE_TABLET | Freq: Two times a day (BID) | ORAL | Status: DC
  Administered 2016-11-01 – 2016-11-03 (×4): 40 meq via ORAL
  Filled 2016-11-01 (×4): qty 2

## 2016-11-01 MED ORDER — ONDANSETRON HCL 4 MG/2ML IJ SOLN
4.0000 mg | Freq: Once | INTRAMUSCULAR | Status: AC
  Administered 2016-11-01: 4 mg via INTRAVENOUS
  Filled 2016-11-01: qty 2

## 2016-11-01 MED ORDER — MIDAZOLAM HCL 2 MG/2ML IJ SOLN
INTRAMUSCULAR | Status: AC
  Filled 2016-11-01: qty 2

## 2016-11-01 MED ORDER — DEXAMETHASONE SODIUM PHOSPHATE 10 MG/ML IJ SOLN
INTRAMUSCULAR | Status: DC | PRN
  Administered 2016-11-01: 5 mg via INTRAVENOUS

## 2016-11-01 MED ORDER — HYDROMORPHONE HCL 1 MG/ML IJ SOLN
0.2500 mg | INTRAMUSCULAR | Status: DC | PRN
  Administered 2016-11-01 (×2): 0.5 mg via INTRAVENOUS

## 2016-11-01 MED ORDER — HYDROMORPHONE HCL 1 MG/ML IJ SOLN
1.0000 mg | INTRAMUSCULAR | Status: DC | PRN
  Administered 2016-11-01 – 2016-11-03 (×9): 1 mg via INTRAVENOUS
  Filled 2016-11-01 (×10): qty 1

## 2016-11-01 MED ORDER — PROPOFOL 10 MG/ML IV BOLUS
INTRAVENOUS | Status: DC | PRN
  Administered 2016-11-01: 140 mg via INTRAVENOUS

## 2016-11-01 MED ORDER — LIDOCAINE 2% (20 MG/ML) 5 ML SYRINGE
INTRAMUSCULAR | Status: AC
  Filled 2016-11-01: qty 5

## 2016-11-01 MED ORDER — SODIUM CHLORIDE 0.9 % IV BOLUS (SEPSIS)
500.0000 mL | Freq: Once | INTRAVENOUS | Status: AC
  Administered 2016-11-01: 500 mL via INTRAVENOUS

## 2016-11-01 MED ORDER — ONDANSETRON HCL 4 MG PO TABS
4.0000 mg | ORAL_TABLET | Freq: Four times a day (QID) | ORAL | Status: DC | PRN
Start: 2016-11-01 — End: 2016-11-03
  Administered 2016-11-01: 4 mg via ORAL

## 2016-11-01 MED ORDER — ADULT MULTIVITAMIN W/MINERALS CH
1.0000 | ORAL_TABLET | Freq: Every day | ORAL | Status: DC
  Administered 2016-11-02 – 2016-11-03 (×2): 1 via ORAL
  Filled 2016-11-01 (×2): qty 1

## 2016-11-01 MED ORDER — SODIUM CHLORIDE 0.9 % IV SOLN
INTRAVENOUS | Status: DC
  Administered 2016-11-01: 1000 mL via INTRAVENOUS

## 2016-11-01 SURGICAL SUPPLY — 13 items
BAG URO CATCHER STRL LF (MISCELLANEOUS) ×2 IMPLANT
BASKET ZERO TIP NITINOL 2.4FR (BASKET) IMPLANT
BSKT STON RTRVL ZERO TP 2.4FR (BASKET)
CATH INTERMIT  6FR 70CM (CATHETERS) ×1 IMPLANT
CLOTH BEACON ORANGE TIMEOUT ST (SAFETY) ×2 IMPLANT
GLOVE BIOGEL M STRL SZ7.5 (GLOVE) ×2 IMPLANT
GOWN STRL REUS W/TWL LRG LVL3 (GOWN DISPOSABLE) ×4 IMPLANT
GUIDEWIRE ANG ZIPWIRE 038X150 (WIRE) IMPLANT
GUIDEWIRE STR DUAL SENSOR (WIRE) ×2 IMPLANT
MANIFOLD NEPTUNE II (INSTRUMENTS) ×2 IMPLANT
PACK CYSTO (CUSTOM PROCEDURE TRAY) ×2 IMPLANT
STENT POLARIS 5FRX22 (STENTS) ×2 IMPLANT
TUBING CONNECTING 10 (TUBING) ×2 IMPLANT

## 2016-11-01 NOTE — Brief Op Note (Signed)
10/31/2016 - 11/01/2016  4:21 PM  PATIENT:  Cindy [name]  25 y.o. female  PRE-OPERATIVE DIAGNOSIS:  bilateral ureteral obstruction  POST-OPERATIVE DIAGNOSIS:  bilateral ureteral obstruction  PROCEDURE:  Procedure(s): CYSTOSCOPY WITH BILATERAL RETROGRADE PYELOGRAM BILATERAL URETERAL STENT PLACEMENT (Bilateral)  SURGEON:  Surgeon(s) and Role:    * Alexis Frock, MD - Primary  PHYSICIAN ASSISTANT:   ASSISTANTS: none   ANESTHESIA:   general  EBL:  Total I/O In: 4580 [I.V.:1445] Out: 350 [Urine:350]  BLOOD ADMINISTERED:none  DRAINS: none   LOCAL MEDICATIONS USED:  NONE  SPECIMEN:  No Specimen  DISPOSITION OF SPECIMEN:  N/A  COUNTS:  YES  TOURNIQUET:  * No tourniquets in log *  DICTATION: .Other Dictation: Dictation Number [id number]  PLAN OF CARE: Admit to inpatient   PATIENT DISPOSITION:  PACU - hemodynamically stable.   Delay start of Pharmacological VTE agent (>24hrs) due to surgical blood loss or risk of bleeding: yes

## 2016-11-01 NOTE — Anesthesia Postprocedure Evaluation (Signed)
Anesthesia Post Note  Patient: Cindy [name]  Procedure(s) Performed: Procedure(s) (LRB): CYSTOSCOPY WITH BILATERAL RETROGRADE PYELOGRAM BILATERAL URETERAL STENT PLACEMENT (Bilateral)  Patient location during evaluation: PACU Anesthesia Type: General Level of consciousness: sedated Pain management: pain level controlled Vital Signs Assessment: post-procedure vital signs reviewed and stable Respiratory status: spontaneous breathing and respiratory function stable Cardiovascular status: stable Anesthetic complications: no    Last Vitals:  Vitals:   11/01/16 1730 11/01/16 1739  BP:  (!) 88/52  Pulse: 100 (!) 105  Resp: 12 16  Temp: 36.5 C 36.6 C    Last Pain:  Vitals:   11/01/16 1715  TempSrc:   PainSc: [place]

## 2016-11-01 NOTE — H&P (Signed)
History and Physical        Hospital Admission Note Date: 11/01/2016  Patient name: Cindy [name] Medical record number: [id number] Date of birth: 12-22-1990 Age: 25 y.o. Gender: female  PCP: No primary care provider on file.   Referring physician: Dr Leonides Schanz  Patient coming from: home    Chief Complaint:  Abdominal pain, nausea and vomiting for 2 days  HPI: Patient is a 25 year old female with a history of colon cancer, short gut syndrome, high output ileostomy, chronic kidney disease with baseline creatinine 1.2, recurrent nephrolithiasis presented to [facility] with worsening nausea, vomiting and left-sided abdominal pain in the last 2 days. History was obtained from the patient who reported that she had gone to OB/GYN on Friday 5 days ago and was diagnosed with UTI. She was started on Bactrim. Since the treatment of her UTI, her dysuria resolved but patient started having abdominal pain with vomiting. Last night her pain was 10 out of 10, sharp and intermittent, left upper quadrant. No fevers or chills or hematemesis, melena. No chest pain or shortness of breath. She had multiple episodes of vomiting, per patient about 10.  ED work-up/course:  In ED, temperature 97.8, respiratory rate 14, pulse 78, BP 86/56. Per patient her BP runs low. No dizziness or lightheadedness. Labs showed sodium 128, potassium 4.9, BUN 36, creatinine 4.15, creatinine 1.56 on 07/03/16 WBC is 13.7 hb13.4 CT abdomen and pelvis showed mild bilateral hydronephrosis with unchanged 1 x 1.1 x 0.9 cm right renal pelvic stone within the upper moiety of a bifid renal pelvis. Nonobstructing interpolar and lower pole left-sided renal calculi with mild hydronephrosis  Review of Systems: Positives marked in 'bold' Constitutional: Denies fever, chills, diaphoresis, poor appetite and fatigue.  HEENT:  Denies photophobia, eye pain, redness, hearing loss, ear pain, congestion, sore throat, rhinorrhea, sneezing, mouth sores, trouble swallowing, neck pain, neck stiffness and tinnitus.   Respiratory: Denies SOB, DOE, cough, chest tightness,  and wheezing.   Cardiovascular: Denies chest pain, palpitations and leg swelling.  Gastrointestinal: Please see history of present illness  Genitourinary: Please see history of present illness Musculoskeletal: Denies myalgias, back pain, joint swelling, arthralgias and gait problem.  Skin: Denies pallor, rash and wound.  Neurological: Denies dizziness, seizures, syncope, weakness, light-headedness, numbness and headaches.  Hematological: Denies adenopathy. Easy bruising, personal or family bleeding history  Psychiatric/Behavioral: Denies suicidal ideation, mood changes, confusion, nervousness, sleep disturbance and agitation  Past Medical History: Past Medical History:  Diagnosis Date  . Cancer ([place])   . Colon cancer ([place]) 2014   s/p resection and ileostomy  . Colon cancer ([place])   . Complication of anesthesia    "I swallowed the anesthesia when appendix taken out; got pneumonia"  . FAP (familial adenomatous polyposis) 09/30/2014  . Heart murmur   . History of blood transfusion 04/26/2013   "4 bags" (05/21/2013)  . Ileostomy care ([place])   . Iron deficiency anemia   . Nephrolithiasis 09/30/2014  . 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 Pneumonia 2008; 2012  . UTI (lower urinary tract infection)     Past Surgical History:  Procedure Laterality  Date  . APPENDECTOMY  2008  . APPENDECTOMY    . COLON SURGERY    . COLONOSCOPY N/A 05/24/2013   Procedure: COLONOSCOPY;  Surgeon: Missy Sabins, MD;  Location: [facility];  Service: Endoscopy;  Laterality: N/A;  . I&D EXTREMITY Left 11/14/2013   Procedure: IRRIGATION AND DEBRIDEMENT LEFT LONG FINGER;  Surgeon: Tennis Must, MD;  Location: [facility];  Service:  Orthopedics;  Laterality: Left;  . ILEOSTOMY  06/26/2013  . ILEOSTOMY    . LEG RECONSTRUCTION USING FASCIAL FLAP  ~ 2009  . still born baby  03/22/13   delivered vaginally @ ~ 24 weeks    Medications: Prior to Admission medications   Medication Sig Start Date End Date Taking? Authorizing Provider  citalopram (CELEXA) 10 MG tablet Take 10 mg by mouth at bedtime.   Yes Historical Provider, MD  cyanocobalamin (,VITAMIN B-12,) 1000 MCG/ML injection Inject 1,000 mcg into the muscle every 30 (thirty) days.   Yes Historical Provider, MD  LORazepam (ATIVAN) 1 MG tablet Take 1 mg by mouth 3 (three) times daily as needed for anxiety.   Yes Historical Provider, MD  magnesium oxide (MAG-OX) 400 (241.3 Mg) MG tablet Take 400 mg by mouth daily.   Yes Historical Provider, MD  Multiple Vitamins-Minerals (MULTIVITAMIN GUMMIES ADULT) CHEW Chew 1 each by mouth daily.    Yes Historical Provider, MD  ondansetron (ZOFRAN ODT) 8 MG disintegrating tablet Take 1 tablet (8 mg total) by mouth every 8 (eight) hours as needed for nausea or vomiting. 02/17/15  Yes Barton Dubois, MD  potassium chloride (KLOR-CON) 20 MEQ packet Take 40 mEq by mouth 2 (two) times daily. 01/15/16  Yes Lavina Hamman, MD    Allergies:   Allergies  Allergen Reactions  . Venofer [Ferric Oxide] Anaphylaxis  . Soap Hives, Itching and Other (See Comments)    Pt states that she is allergic to [employer].    . Food Hives and Other (See Comments)    Pt states that she is allergic to orange juice.   . Iron Palpitations  . Levaquin [Levofloxacin] Hives    Social History:  reports that she has never smoked. She has never used smokeless tobacco. She reports that she does not drink alcohol or use drugs.  Family History: Family History  Problem Relation Age of Onset  . Adopted: Yes  . ALS Mother   . Alcohol abuse Neg Hx   . Arthritis Neg Hx   . Asthma Neg Hx   . Birth defects Neg Hx   . Cancer Neg Hx   . COPD Neg Hx   . Depression Neg Hx     . Diabetes Neg Hx   . Drug abuse Neg Hx   . Early death Neg Hx   . Hearing loss Neg Hx   . Heart disease Neg Hx   . Hyperlipidemia Neg Hx   . Hypertension Neg Hx   . Kidney disease Neg Hx   . Learning disabilities Neg Hx   . Mental illness Neg Hx   . Mental retardation Neg Hx   . Miscarriages / Stillbirths Neg Hx   . Stroke Neg Hx   . Vision loss Neg Hx     Physical Exam: Blood pressure (!) 86/46, pulse 78, temperature 97.5 F (36.4 C), temperature source Oral, resp. rate 18, height 5\' 1"  (1.549 m), weight 48.1 kg (106 lb), last menstrual period 10/04/2016, SpO2 100 %. General: Alert, awake, oriented x3, in no acute distress.  HEENT: normocephalic, atraumatic, anicteric sclera, pink conjunctiva, pupils equal and reactive to light and accomodation, oropharynx clear Neck: supple, no masses or lymphadenopathy, no goiter, no bruits  Heart: Regular rate and rhythm, without murmurs, rubs or gallops. Lungs: Clear to auscultation bilaterally, no wheezing, rales or rhonchi. Abdomen: Soft,Tender in the left upper quadrant and flank area, nondistended, positive bowel sounds, ileostomy . Extremities: No clubbing, cyanosis or edema with positive pedal pulses. Neuro: Grossly intact, no focal neurological deficits, strength 5/5 upper and lower extremities bilaterally Psych: alert and oriented x 3, normal mood and affect Skin: no rashes or lesions, warm and dry   LABS on Admission:  Basic Metabolic Panel:  Recent Labs Lab 10/31/16 2013  NA 128*  K 4.9  CL 102  CO2 14*  GLUCOSE 110*  BUN 36*  CREATININE 4.15*  CALCIUM 8.8*   Liver Function Tests:  Recent Labs Lab 10/31/16 2013  AST 43*  ALT 13*  ALKPHOS 76  BILITOT 1.2  PROT 8.9*  ALBUMIN 4.8    Recent Labs Lab 10/31/16 2013  LIPASE 40   No results for input(s): AMMONIA in the last 168 hours. CBC:  Recent Labs Lab 10/31/16 2013  WBC 13.7*  NEUTROABS 10.4*  HGB 13.4  HCT 38.7  MCV 78.8  PLT 323   Cardiac  Enzymes: No results for input(s): CKTOTAL, CKMB, CKMBINDEX, TROPONINI in the last 168 hours. BNP: Invalid input(s): POCBNP CBG: No results for input(s): GLUCAP in the last 168 hours.  Radiological Exams on Admission:  No results found.  *I have personally reviewed the images above*     Assessment/Plan Principal Problem:   Acute-on-chronic kidney injury ([place]): Likely due to Hydronephrosis and nephrolithiasis, severe dehydration,  - Baseline creatinine 1.2-1.5, per labs in 01/15/16  - Urology consulted, planning bilateral ureteral stent placement today and ureteroscopy  - Continue  NPO status IV fluids and IV Rocephin   Active Problems:   Anemia, iron deficiency - Monitor counts, currently H&H stable    Nephrolithiasis - As #1, urology consulted, planning bilateral ureteral stent placement today with ureteroscopy    Arterial hypotension - Per patient, her BP runs low, currently asymptomatic, will continue IV fluids and IV antibiotics, follow urine cultures   SIRS with UTI (urinary tract infection) - Patient meets his service criteria with hypotension, acute renal insufficiency, source being UTI however also has nephrolithiasis - Follow urine culture, placed on IV Rocephin    Acute kidney injury ([place]) - Continue IV fluid hydration, and urology procedures today  Hyponatremia - Likely due to severe dehydration, continue IV fluid hydration  DVT prophylaxis: Heparin subcutaneous  CODE STATUS: Full CODE STATUS  Consults called: Urology  Family Communication: Admission, patients condition and plan of care including tests being ordered have been discussed with the patient andfamily member in the room  who indicates understanding and agree with the plan and Code Status  Admission status: inpatient  Disposition plan: Further plan will depend as patient's clinical course evolves and further radiologic and laboratory data become available. Likely home   At the time of  admission, it appears that the appropriate admission status for this patient is INPATIENT . This is judged to be reasonable and necessary in order to provide the required intensity of service to ensure the patient's safety given the presenting symptoms, physical exam findings, and initial radiographic and laboratory data in the context of their chronic comorbidities.    Time Spent on Admission: 66mins   Sabah Zucco M.D. [facility] 11/01/2016, 9:07  AM Pager: [phone]  If 7PM-7AM, please contact night-coverage [email or web address] Password [email or web address]

## 2016-11-01 NOTE — Care Management Note (Signed)
Case Management Note  Patient Details  Name: Cindy [name] MRN: [id number] Date of Birth: [birth year]  Subjective/Objective:25 y/o f admitted w/AKI. From home.                    Action/Plan:d/c plan home.   Expected Discharge Date:                  Expected Discharge Plan:  Home/Self Care  In-House Referral:     Discharge planning Services  CM Consult  Post Acute Care Choice:    Choice offered to:     DME Arranged:    DME Agency:     HH Arranged:    HH Agency:     Status of Service:  In process, will continue to follow  If discussed at Long Length of Stay Meetings, dates discussed:    Additional Comments:  Dessa Phi, RN 11/01/2016, 2:20 PM

## 2016-11-01 NOTE — Transfer of Care (Signed)
Immediate Anesthesia Transfer of Care Note  Patient: Cindy [name]  Procedure(s) Performed: Procedure(s): CYSTOSCOPY WITH BILATERAL RETROGRADE PYELOGRAM BILATERAL URETERAL STENT PLACEMENT (Bilateral)  Patient Location: PACU  Anesthesia Type:General  Level of Consciousness:  sedated, patient cooperative and responds to stimulation  Airway & Oxygen Therapy:Patient Spontanous Breathing and Patient connected to face mask oxgen  Post-op Assessment:  Report given to PACU RN and Post -op Vital signs reviewed and stable  Post vital signs:  Reviewed and stable  Last Vitals:  Vitals:   11/01/16 0900 11/01/16 1418  BP: (!) 86/46 (!) 94/53  Pulse: 78 89  Resp:    Temp:  50.5 C    Complications: No apparent anesthesia complications

## 2016-11-01 NOTE — Consult Note (Signed)
Reason for Consult: Recurrent Nephrolithiasis, Acute Renal Failure, Chronic Mild Hydronephrosis, Medical Stone Disease, Bacteruria  Referring Physician: Myrene Buddy MD  Arman Bogus is an 25 y.o. female.   HPI:   1 - Recurrent Nephrolithiasis - long h/o recurrent nephrolithiasis with medical stone passage x many. She states this predates her iliostomy but has worsned since. She may desire future elective pregnancy.  10/2016 - CT - Rt upper pole 75m stone with focal upper pole hydro, scattered Lt punctate renal stones  2 -  Acute Renal Failure - Baseline Cr 1.3 up to 4s by ER labs 10/2016. Admits to recent bactrim use and some mild nauseas but no severe dehydration or acute change in ostomy output. She does have chronic mild hydro and stones as per below. No hyperkalemia.  3 - Chronic Mild Hydronephrosis - long h/o Rt>Lt mild hydro on imaging since at least 2009 even at times when stones not present. No prior renography. Baselien Cr 1.3's.  4 -  Medical Stone Disease - s/p iliosotomy for which she has to take daily imodium. Eval 2017: BMP,PTH,Urate - pending; Composition - pending; 24 Hr Urines - pending  5 - Bacteruria - pt with occasional cystitis. Treated with bactrim starting 4 days ago for suspect cystitis with symptoms of dyuria. No fevers. No leukocytosis / tachycardia. persistant bacterurira by ER UA 12/5.  PMH sig for FAP/total colectomy with end iliostomy ([facility], [facility] after [facility].   Today "Myleka" is seen in consultation for above, specifically for opinion on acute renal failure and nephrolithiasis.   Past Medical History:  Diagnosis Date  . Cancer ([place]   . Colon cancer ([facility] 2014   s/p resection and ileostomy  . [place] cancer [place]   . Complication of anesthesia    "I swallowed the anesthesia when appendix taken out; got pneumonia"  . FAP (familial adenomatous polyposis) 09/30/2014  . Heart murmur   . History of  blood transfusion 04/26/2013   "4 bags" (05/21/2013)  . [place]le[place] care ([place]   . Iron deficiency anemia   . Nephrolithiasis 09/30/2014  . Osteomyelitis of finger of left hand ([place] 10/2013   3rd finger  . Pneumonia 2008; 2012  . UTI (lower urinary tract infection)     Past Surgical History:  Procedure Laterality Date  . APPENDECTOMY  2008  . APPENDECTOMY    . COLON SURGERY    . COLONOSCOPY N/A 05/24/2013   Procedure: COLONOSCOPY;  Surgeon: JMissy Sabins MD;  [facility]  Service: Endoscopy;  Laterality: N/A;  . I&D EXTREMITY Left 11/14/2013   Procedure: IRRIGATION AND DEBRIDEMENT LEFT LONG FINGER;  Surgeon: KTennis Must MD;  [facility]  Service: Orthopedics;  Laterality: Left;  . ILEOSTOMY  06/26/2013  . ILEOSTOMY    . LEG RECONSTRUCTION USING FASCIAL FLAP  ~ 2009  . still born baby  03/22/13   delivered vaginally @ ~ 24 weeks    Family History  Problem Relation Age of Onset  . Adopted: Yes  . ALS Mother   . Alcohol abuse Neg Hx   . Arthritis Neg Hx   . Asthma Neg Hx   . Birth defects Neg Hx   . Cancer Neg Hx   . COPD Neg Hx   . Depression Neg Hx   . Diabetes Neg Hx   . Drug abuse Neg Hx   . Early death Neg Hx   . Hearing loss Neg Hx   .  Heart disease Neg Hx   . Hyperlipidemia Neg Hx   . Hypertension Neg Hx   . Kidney disease Neg Hx   . Learning disabilities Neg Hx   . Mental illness Neg Hx   . Mental retardation Neg Hx   . Miscarriages / Stillbirths Neg Hx   . Stroke Neg Hx   . Vision loss Neg Hx     Social History:  reports that she has never smoked. She has never used smokeless tobacco. She reports that she does not drink alcohol or use drugs.  Allergies:  Allergies  Allergen Reactions  . Venofer [Ferric Oxide] Anaphylaxis  . Soap Hives, Itching and Other (See Comments)    Pt states that she is allergic to [employer].    . Food Hives and Other (See Comments)    Pt states that she is allergic to orange juice.   . Iron  Palpitations  . Levaquin [Levofloxacin] Hives    Medications: I have reviewed the patient's current medications.  Results for orders placed or performed during the hospital encounter of 10/31/16 (from the past 48 hour(s))  Lipase, blood     Status: None   Collection Time: 10/31/16  8:13 PM  Result Value Ref Range   Lipase 40 11 - 51 U/L  Comprehensive metabolic panel     Status: Abnormal   Collection Time: 10/31/16  8:13 PM  Result Value Ref Range   Sodium 128 (L) 135 - 145 mmol/L   Potassium 4.9 3.5 - 5.1 mmol/L   Chloride 102 101 - 111 mmol/L   CO2 14 (L) 22 - 32 mmol/L   Glucose, Bld 110 (H) 65 - 99 mg/dL   BUN 36 (H) 6 - 20 mg/dL   Creatinine, Ser 4.15 (H) 0.44 - 1.00 mg/dL   Calcium 8.8 (L) 8.9 - 10.3 mg/dL   Total Protein 8.9 (H) 6.5 - 8.1 g/dL   Albumin 4.8 3.5 - 5.0 g/dL   AST 43 (H) 15 - 41 U/L   ALT 13 (L) 14 - 54 U/L   Alkaline Phosphatase 76 38 - 126 U/L   Total Bilirubin 1.2 0.3 - 1.2 mg/dL   GFR calc non Af Amer 14 (L) >60 mL/min   GFR calc Af Amer 16 (L) >60 mL/min    Comment: (NOTE) The eGFR has been calculated using the CKD EPI equation. This calculation has not been validated in all clinical situations. eGFR's persistently <60 mL/min signify possible Chronic Kidney Disease.    Anion gap 12 5 - 15  CBC     Status: Abnormal   Collection Time: 10/31/16  8:13 PM  Result Value Ref Range   WBC 13.7 (H) 4.0 - 10.5 K/uL   RBC 4.91 3.87 - 5.11 MIL/uL   Hemoglobin 13.4 12.0 - 15.0 g/dL   HCT 38.7 36.0 - 46.0 %   MCV 78.8 78.0 - 100.0 fL   MCH 27.3 26.0 - 34.0 pg   MCHC 34.6 30.0 - 36.0 g/dL   RDW 12.9 11.5 - 15.5 %   Platelets 323 150 - 400 K/uL  Differential     Status: Abnormal   Collection Time: 10/31/16  8:13 PM  Result Value Ref Range   Neutrophils Relative % 77 %   Neutro Abs 10.4 (H) 1.7 - 7.7 K/uL   Lymphocytes Relative 16 %   Lymphs Abs 2.2 0.7 - 4.0 K/uL   Monocytes Relative 7 %   Monocytes Absolute 0.9 0.1 - 1.0 K/uL   Eosinophils Relative  0 %   Eosinophils Absolute 0.0 0.0 - 0.7 K/uL   Basophils Relative 0 %   Basophils Absolute 0.0 0.0 - 0.1 K/uL  Urinalysis, Routine w reflex microscopic     Status: Abnormal   Collection Time: 10/31/16 10:30 PM  Result Value Ref Range   Color, Urine YELLOW YELLOW   APPearance CLOUDY (A) CLEAR   Specific Gravity, Urine 1.011 1.005 - 1.030   pH 5.5 5.0 - 8.0   Glucose, UA NEGATIVE NEGATIVE mg/dL   Hgb urine dipstick MODERATE (A) NEGATIVE   Bilirubin Urine NEGATIVE NEGATIVE   Ketones, ur NEGATIVE NEGATIVE mg/dL   Protein, ur 30 (A) NEGATIVE mg/dL   Nitrite NEGATIVE NEGATIVE   Leukocytes, UA SMALL (A) NEGATIVE  Urinalysis, Microscopic (reflex)     Status: Abnormal   Collection Time: 10/31/16 10:30 PM  Result Value Ref Range   RBC / HPF 6-30 0 - 5 RBC/hpf   WBC, UA 6-30 0 - 5 WBC/hpf   Bacteria, UA MANY (A) NONE SEEN   Squamous Epithelial / LPF 6-30 (A) NONE SEEN   Hyphae Yeast PRESENT    Ca Oxalate Crys, UA PRESENT     Ct Abdomen Pelvis Wo Contrast  Result Date: 10/31/2016 CLINICAL DATA:  Nausea, vomiting, left-sided abdominal pain x5 days. Recent urinary tract infection. History of colon cancer with partial colectomy, ileostomy and appendectomy. EXAM: CT ABDOMEN AND PELVIS WITHOUT CONTRAST TECHNIQUE: Multidetector CT imaging of the abdomen and pelvis was performed following the standard protocol without IV contrast. COMPARISON:  07/03/2016 FINDINGS: Lower chest: No acute abnormality. Hepatobiliary: No focal liver abnormality is seen. No gallstones, gallbladder wall thickening, or biliary dilatation. Pancreas: Unremarkable. No pancreatic ductal dilatation or surrounding inflammatory changes. Spleen: Normal in size without focal abnormality. Adrenals/Urinary Tract: Normal bilateral adrenal glands. Unchanged 1 x 1.1 x 0.9 cm right renal pelvic stone with mild hydronephrosis. Nonobstructing interpolar and left lower pole renal calculi are identified with mild left-sided hydronephrosis since  previous exam. No obstructing calculus is seen. Left interpolar posterior subtle hypodensity measuring 1 cm in is unchanged likely to reflect a cyst. There is a punctate density within the bladder possibly representing a passed stone, series 2, image 86. Stomach/Bowel: Right lower quadrant ileostomy. Partial colectomy on the right. No bowel obstruction or inflammation. Vascular/Lymphatic: No significant vascular findings are present. No enlarged abdominal or pelvic lymph nodes. Reproductive: Uterus and bilateral adnexa are unremarkable. Other: Stable pelvic calcifications consistent with phleboliths. Small amount of free fluid in cul-de-sac. Musculoskeletal: No acute abnormality IMPRESSION: 1. 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 Nonobstructing interpolar and lower pole left-sided renal calculi with mild hydronephrosis. Punctate calcification is is suggested within the bladder which may represent a recently passed stone. Stable left renal cyst. 2. No acute bowel inflammation. Right lower quadrant ileostomy partial right-sided colectomy. Electronically Signed   By: Ashley Royalty M.D.   On: 10/31/2016 23:24    ROS Blood pressure (!) 58/45, pulse 77, temperature 97.5 F (36.4 C), temperature source Oral, resp. rate 18, height 5' 1"  (1.549 m), weight 48.1 kg (106 lb), last menstrual period 10/04/2016, SpO2 100 %. Physical Exam  Assessment/Plan:  1 - Recurrent Nephrolithiasis - Rt > Lt current stone burden. As young fertile age female, rec path to stone free as follows.  A - cysto bilateral ureteral stent placement today (goal of renal decompression in setting of possible obstructive component)  B - two stage bilaeral ureteroscopy to stone free (these will be outpatient surgeries performed in elective setting).  Implications of future stones in pregnancy discussed with STRONG rec for GU eval prior to  elective pregnancies to verify stone free prior.   NPO now, MIVF, risks / benefits /  Peri-op course / alternatives of stents discussed.   2 -  Acute Renal Failure - likely multifactorial with some component pre-renal dehydration with high output ilostomy + some ? Obstructive componenet with stones + bactrim toxicity.  This will likely resolve with renal decompression and hydration.   3 - Chronic Mild Hydronephrosis - ureteroscopy as per above to verify etiology.   4 -  Medical Stone Disease - this is likely due to relative hyperoxaluira from bowel mal-absorption. Future metabolic eval to confirm. Would likely benefits tom CaCO TID meals if this is case.  5 - Bacteruria - stents today to allow renal decompression / clearance of any bacteruria.   Will follow, please call me directly with questions anytime.   Taylour Lietzke 11/01/2016, 7:08 AM

## 2016-11-01 NOTE — Anesthesia Preprocedure Evaluation (Signed)
Anesthesia Evaluation  Patient identified by MRN, date of birth, ID band Patient awake    Reviewed: Allergy & Precautions, H&P , NPO status , Patient's Chart, lab work & pertinent test results  History of Anesthesia Complications Negative for: history of anesthetic complications  Airway Mallampati: II  TM Distance: >3 FB Neck ROM: full    Dental  (+) Teeth Intact, Dental Advidsory Given   Pulmonary neg pulmonary ROS,    Pulmonary exam normal breath sounds clear to auscultation       Cardiovascular negative cardio ROS   Rhythm:regular Rate:Normal     Neuro/Psych negative neurological ROS  negative psych ROS   GI/Hepatic negative GI ROS, Neg liver ROS,   Endo/Other  negative endocrine ROS  Renal/GU negative Renal ROS     Musculoskeletal   Abdominal Normal abdominal exam  (+)   Peds  Hematology  (+) anemia ,   Anesthesia Other Findings   Reproductive/Obstetrics negative OB ROS                             Anesthesia Physical  Anesthesia Plan  ASA: III  Anesthesia Plan: General LMA   Post-op Pain Management:    Induction: Intravenous  Airway Management Planned: LMA  Additional Equipment:   Intra-op Plan:   Post-operative Plan: Extubation in OR  Informed Consent: I have reviewed the patients History and Physical, chart, labs and discussed the procedure including the risks, benefits and alternatives for the proposed anesthesia with the patient or authorized representative who has indicated his/her understanding and acceptance.   Dental Advisory Given  Plan Discussed with: Anesthesiologist, CRNA and Surgeon  Anesthesia Plan Comments:         Anesthesia Quick Evaluation

## 2016-11-01 NOTE — Progress Notes (Addendum)
25 yo F with FAP s/p total colectomy in 2014 for colon CA, now with short gut syndrome, high output ileostomy, followed by Neph for recurrent AKI on CKD (baseline Cr 1.2 per last Neph note).  Just went to OB-Gyn on Friday and was started on Bactrim for UTI (culture shows Klebs, Bactrim and cephalexin susc).  Now presents with Left sided abd pain and n/v.   BP 90/61   Pulse 84   Temp 97.8 F (36.6 C) (Oral)   Resp 15   Ht 5\' 1"  (1.549 m)   Wt 48.1 kg (106 lb)   LMP 10/04/2016   SpO2 100%   BMI 20.03 kg/m    BP soft, but near baseline.  Afebrile. WBC 13K, Na 128 (baseline 130).  We are asked to evaluate for Cr 4.15.  CT abdomen shows old 1cm right renal stone, now with mild hydronephrosis.  UA shows improved pyuria, still many bacteria.     EDP will discuss with Urology.  If their eval is needed, will accept to [facility].  Otherwise, will accept here to [facility] for AKI.  Update: EDP spoke with Dr. Tresa Moore from Urology.  Rec'd admission to [facility] in case Urology intervention needed.  However, stone appears mostly stable from previous, so in meantime, hydrate first.  If no correction of renal function with hydration, then consult Urology.

## 2016-11-01 NOTE — Anesthesia Procedure Notes (Signed)
Procedure Name: LMA Insertion Date/Time: 11/01/2016 3:55 PM Performed by: Talbot Grumbling Pre-anesthesia Checklist: Patient identified, Emergency Drugs available, Suction available and Patient being monitored Patient Re-evaluated:Patient Re-evaluated prior to inductionOxygen Delivery Method: Circle system utilized Preoxygenation: Pre-oxygenation with 100% oxygen Intubation Type: IV induction Ventilation: Mask ventilation without difficulty LMA: LMA inserted LMA Size: 3.0 Number of attempts: 1 Placement Confirmation: positive ETCO2 and breath sounds checked- equal and bilateral Tube secured with: Tape Dental Injury: Teeth and Oropharynx as per pre-operative assessment

## 2016-11-02 ENCOUNTER — Encounter (HOSPITAL_COMMUNITY): Payer: Self-pay | Admitting: Urology

## 2016-11-02 DIAGNOSIS — E872 Acidosis: Secondary | ICD-10-CM

## 2016-11-02 DIAGNOSIS — I9589 Other hypotension: Secondary | ICD-10-CM

## 2016-11-02 LAB — CBC
HEMATOCRIT: 31.2 % — AB (ref 36.0–46.0)
HEMOGLOBIN: 10.2 g/dL — AB (ref 12.0–15.0)
MCH: 27.1 pg (ref 26.0–34.0)
MCHC: 32.7 g/dL (ref 30.0–36.0)
MCV: 83 fL (ref 78.0–100.0)
Platelets: 237 10*3/uL (ref 150–400)
RBC: 3.76 MIL/uL — ABNORMAL LOW (ref 3.87–5.11)
RDW: 13.4 % (ref 11.5–15.5)
WBC: 12.1 10*3/uL — ABNORMAL HIGH (ref 4.0–10.5)

## 2016-11-02 LAB — BASIC METABOLIC PANEL
ANION GAP: 7 (ref 5–15)
BUN: 26 mg/dL — AB (ref 6–20)
CALCIUM: 8.1 mg/dL — AB (ref 8.9–10.3)
CO2: 14 mmol/L — AB (ref 22–32)
Chloride: 115 mmol/L — ABNORMAL HIGH (ref 101–111)
Creatinine, Ser: 2.12 mg/dL — ABNORMAL HIGH (ref 0.44–1.00)
GFR calc Af Amer: 36 mL/min — ABNORMAL LOW (ref >60)
GFR, EST NON AFRICAN AMERICAN: 31 mL/min — AB (ref >60)
GLUCOSE: 132 mg/dL — AB (ref 65–99)
POTASSIUM: 3.6 mmol/L (ref 3.5–5.1)
Sodium: 136 mmol/L (ref 135–145)

## 2016-11-02 LAB — URINE CULTURE: CULTURE: NO GROWTH

## 2016-11-02 NOTE — Progress Notes (Addendum)
PROGRESS NOTE    Cindy [name]  JXB:[id number] DOB: Sep 04, 1991 DOA: 10/31/2016  PCP: No primary care provider on file.   Brief Narrative:  Patient is a 25 year old female with a history of colon cancer s/p resection,  short gut syndrome, high output ileostomy, chronic kidney disease with baseline creatinine 1.2, recurrent nephrolithiasis presented to [facility] with worsening nausea, vomiting and left-sided abdominal pain in the last 2 days. History was obtained from the patient who reported that she had gone to OB/GYN on Friday 5 days ago and was diagnosed with UTI. She states her symptoms were burning and increased frequency.  She was started on Bactrim. After starting Bactrim, she started having abdominal pain with vomiting. Last night her pain was 10 out of 10, sharp and intermittent, left upper quadrant. No fevers or chills or hematemesis, melena. No chest pain or shortness of breath. She had multiple episodes of vomiting, per patient about 10.  Subjective: Having burning micturition and lower abdominal pain. Vomiting resolved. Had clears and did well.   Assessment & Plan:   Principal Problem:   Acute-on-chronic kidney injury/ metabolic acidosis - now resolving - prerenal due to vomiting, h/o high output ileostomy (need to document output in hospital to confirm this) and ? Due to Bactrim-  ? due to nephrolithiasis and obstruction- imaging actually shows hydronephrosis to be chronic - cont IVF today  Active Problems:   Nephrolithiasis/ b/l hydronephrosis - has a h/o of this- ? Hyperoxaluria but she states to Dr Tresa Moore she has been having this issue prior to ileostomy -  b/l stents placed yesterday- Dr Tresa Moore to follow as outpt to determine cause of nephorlithiasis - UA mildly + - on Rocephin- took Bactrim as outpt as mentioned  Vomiting -? Due to bactrim vs nephrolithiasis - resolved   High output Ileostomy due to colon resection for colon cancer in 2016- Familial  adenomatous polyposis - on Imodium for this- follow ileostomy Output closely    Anemia - follow    Arterial hypotension - resolved- ? Due to dehydration- may be chronically low and physiologically normal for her  DVT prophylaxis: SCD Code Status: Full code Family Communication:  Disposition Plan: home tomorrow if stable Consultants:   Urology Procedures:  CYSTOSCOPY WITH BILATERAL RETROGRADE PYELOGRAM BILATERAL URETERAL STENT PLACEMENT (Bilateral Antimicrobials:  Anti-infectives    Start     Dose/Rate Route Frequency Ordered Stop   11/01/16 1000  cefTRIAXone (ROCEPHIN) 1 g in dextrose 5 % 50 mL IVPB     1 g 100 mL/hr over 30 Minutes Intravenous Every 24 hours 11/01/16 0906         Objective: Vitals:   11/01/16 1730 11/01/16 1739 11/01/16 2125 11/02/16 0550  BP:  (!) 88/52 (!) 101/55 (!) 96/57  Pulse: 100 (!) 105 (!) 114 (!) 107  Resp: 12 16 16 16   Temp: 97.7 F (36.5 C) 97.9 F (36.6 C) 98.3 F (36.8 C) 98.1 F (36.7 C)  TempSrc:   Oral Oral  SpO2: 98% 99% 100% 100%  Weight:      Height:        Intake/Output Summary (Last 24 hours) at 11/02/16 0934 Last data filed at 11/02/16 0555  Gross per 24 hour  Intake             1445 ml  Output              660 ml  Net  785 ml   Filed Weights   10/31/16 1920  Weight: 48.1 kg (106 lb)    Examination: General exam: Appears comfortable  HEENT: PERRLA, oral mucosa moist, no sclera icterus or thrush Respiratory system: Clear to auscultation. Respiratory effort normal. Cardiovascular system: S1 & S2 heard, RRR.  No murmurs  Gastrointestinal system: Abdomen soft,  -tender in lower abdomen, nondistended. Normal bowel sound. No organomegaly Central nervous system: Alert and oriented. No focal neurological deficits. Extremities: No cyanosis, clubbing or edema Skin: No rashes or ulcers Psychiatry:  Mood & affect appropriate.     Data Reviewed: I have personally reviewed following labs and imaging  studies  CBC:  Recent Labs Lab 10/31/16 2013 11/01/16 0953 11/02/16 0451  WBC 13.7* 8.4 12.1*  NEUTROABS 10.4*  --   --   HGB 13.4 11.8* 10.2*  HCT 38.7 35.4* 31.2*  MCV 78.8 81.4 83.0  PLT 323 250 672   Basic Metabolic Panel:  Recent Labs Lab 10/31/16 2013 11/01/16 0953 11/02/16 0451  NA 128*  --  136  K 4.9  --  3.6  CL 102  --  115*  CO2 14*  --  14*  GLUCOSE 110*  --  132*  BUN 36*  --  26*  CREATININE 4.15* 2.79* 2.12*  CALCIUM 8.8*  --  8.1*   GFR: Estimated Creatinine Clearance: 30.1 mL/min (by C-G formula based on SCr of 2.12 mg/dL (H)). Liver Function Tests:  Recent Labs Lab 10/31/16 2013  AST 43*  ALT 13*  ALKPHOS 76  BILITOT 1.2  PROT 8.9*  ALBUMIN 4.8    Recent Labs Lab 10/31/16 2013  LIPASE 40   No results for input(s): AMMONIA in the last 168 hours. Coagulation Profile: No results for input(s): INR, PROTIME in the last 168 hours. Cardiac Enzymes: No results for input(s): CKTOTAL, CKMB, CKMBINDEX, TROPONINI in the last 168 hours. BNP (last 3 results) No results for input(s): PROBNP in the last 8760 hours. HbA1C: No results for input(s): HGBA1C in the last 72 hours. CBG: No results for input(s): GLUCAP in the last 168 hours. Lipid Profile: No results for input(s): CHOL, HDL, LDLCALC, TRIG, CHOLHDL, LDLDIRECT in the last 72 hours. Thyroid Function Tests: No results for input(s): TSH, T4TOTAL, FREET4, T3FREE, THYROIDAB in the last 72 hours. Anemia Panel: No results for input(s): VITAMINB12, FOLATE, FERRITIN, TIBC, IRON, RETICCTPCT in the last 72 hours. Urine analysis:    Component Value Date/Time   COLORURINE STRAW (A) 11/01/2016 1002   APPEARANCEUR CLEAR 11/01/2016 1002   LABSPEC 1.009 11/01/2016 1002   PHURINE 5.0 11/01/2016 1002   GLUCOSEU NEGATIVE 11/01/2016 1002   HGBUR NEGATIVE 11/01/2016 1002   BILIRUBINUR NEGATIVE 11/01/2016 1002   KETONESUR NEGATIVE 11/01/2016 1002   PROTEINUR NEGATIVE 11/01/2016 1002   UROBILINOGEN  0.2 07/29/2015 0320   NITRITE NEGATIVE 11/01/2016 1002   LEUKOCYTESUR NEGATIVE 11/01/2016 1002   Sepsis Labs: @LABRCNTIP (procalcitonin:4,lacticidven:4) ) Recent Results (from the past 240 hour(s))  Urine culture     Status: None   Collection Time: 11/01/16 10:02 AM  Result Value Ref Range Status   Specimen Description URINE, CLEAN CATCH  Final   Special Requests NONE  Final   Culture NO GROWTH Performed at [facility]   Final   Report Status 11/02/2016 FINAL  Final         Radiology Studies: Ct Abdomen Pelvis Wo Contrast  Result Date: 10/31/2016 CLINICAL DATA:  Nausea, vomiting, left-sided abdominal pain x5 days. Recent urinary tract infection. History of colon  cancer with partial colectomy, ileostomy and appendectomy. EXAM: CT ABDOMEN AND PELVIS WITHOUT CONTRAST TECHNIQUE: Multidetector CT imaging of the abdomen and pelvis was performed following the standard protocol without IV contrast. COMPARISON:  07/03/2016 FINDINGS: Lower chest: No acute abnormality. Hepatobiliary: No focal liver abnormality is seen. No gallstones, gallbladder wall thickening, or biliary dilatation. Pancreas: Unremarkable. No pancreatic ductal dilatation or surrounding inflammatory changes. Spleen: Normal in size without focal abnormality. Adrenals/Urinary Tract: Normal bilateral adrenal glands. Unchanged 1 x 1.1 x 0.9 cm right renal pelvic stone with mild hydronephrosis. Nonobstructing interpolar and left lower pole renal calculi are identified with mild left-sided hydronephrosis since previous exam. No obstructing calculus is seen. Left interpolar posterior subtle hypodensity measuring 1 cm in is unchanged likely to reflect a cyst. There is a punctate density within the bladder possibly representing a passed stone, series 2, image 86. Stomach/Bowel: Right lower quadrant ileostomy. Partial colectomy on the right. No bowel obstruction or inflammation. Vascular/Lymphatic: No significant vascular findings are  present. No enlarged abdominal or pelvic lymph nodes. Reproductive: Uterus and bilateral adnexa are unremarkable. Other: Stable pelvic calcifications consistent with phleboliths. Small amount of free fluid in cul-de-sac. Musculoskeletal: No acute abnormality IMPRESSION: 1. 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 Nonobstructing interpolar and lower pole left-sided renal calculi with mild hydronephrosis. Punctate calcification is is suggested within the bladder which may represent a recently passed stone. Stable left renal cyst. 2. No acute bowel inflammation. Right lower quadrant ileostomy partial right-sided colectomy. Electronically Signed   By: Ashley Royalty M.D.   On: 10/31/2016 23:24      Scheduled Meds: . cefTRIAXone (ROCEPHIN)  IV  1 g Intravenous Q24H  . citalopram  10 mg Oral QHS  . heparin  5,000 Units Subcutaneous Q8H  . magnesium oxide  400 mg Oral Daily  . multivitamin with minerals  1 tablet Oral Daily  . potassium chloride  40 mEq Oral BID   Continuous Infusions: . sodium chloride 100 mL/hr at 11/01/16 0729  . sodium chloride 100 mL/hr at 11/02/16 0736     LOS: 1 day    Time spent in minutes: 66    [place], MD [facility] Pager: [email or web address] Password [facility] 11/02/2016, 9:34 AM

## 2016-11-02 NOTE — Care Management Note (Signed)
Case Management Note  Patient Details  Name: Cindy [name] MRN: [id number] Date of Birth: [birth year]  Subjective/Objective: 25 y/o f admitted w/Acute on chronic kidney injury. From home.                   Action/Plan:d/c home.   Expected Discharge Date:                  Expected Discharge Plan:  Home/Self Care  In-House Referral:     Discharge planning Services  CM Consult  Post Acute Care Choice:    Choice offered to:     DME Arranged:    DME Agency:     HH Arranged:    HH Agency:     Status of Service:  In process, will continue to follow  If discussed at Long Length of Stay Meetings, dates discussed:    Additional Comments:  Dessa Phi, RN 11/02/2016, 12:43 PM

## 2016-11-02 NOTE — Progress Notes (Signed)
1 Day Post-Op  Subjective:  1 - Recurrent Nephrolithiasis - long h/o recurrent nephrolithiasis with medical stone passage x many. She states this predates her iliostomy but has worsned since. She may desire future elective pregnancy.  10/2016 - CT - Rt upper pole 65mm stone with focal upper pole hydro, scattered Lt punctate renal stones --> bilateral JJ stents placed 11/01/16, plan for staged bilateral ureteroscopy in elective setting.   2 -  Acute Renal Failure - Baseline Cr 1.3 up to 4s by ER labs 10/2016. Admits to recent bactrim use and some mild nauseas but no severe dehydration or acute change in ostomy output. She does have chronic mild hydro and stones as per below. No hyperkalemia. Now rapidly improving after renal decompression and hydration.   3 - Chronic Mild Hydronephrosis - long h/o Rt>Lt mild hydro on imaging since at least 2009 even at times when stones not present. No prior renography. Baselien Cr 1.3's.  4 -  Medical Stone Disease - s/p iliosotomy for which she has to take daily imodium. Eval 2017: BMP,PTH,Urate - pending; Composition - pending; 24 Hr Urines - pending  5 - Bacteruria - pt with occasional cystitis. Treated with bactrim starting 4 days ago for suspect cystitis with symptoms of dyuria. No fevers. No leukocytosis / tachycardia. persistant bacterurira by ER UA 12/5 but UCX negative / final.   PMH sig for FAP/total colectomy with end iliostomy (follows Lenise [facility] at [facility]), Leg reconstruction after injury.   Today "Cindy [name]" is seen stable. Some irritative voiding as expected with ureteral stents, Cr continues to trend down. UCX final and negative..    Objective: Vital signs in last 24 hours: Temp:  [97.5 F (36.4 C)-98.3 F (36.8 C)] 98.2 F (36.8 C) (12/07 1422) Pulse Rate:  [80-114] 106 (12/07 1422) Resp:  [12-16] 16 (12/07 1422) BP: (85-102)/(43-60) 102/60 (12/07 1422) SpO2:  [93 %-100 %] 100 % (12/07 1422) Last BM Date:  11/01/16  Intake/Output from previous day: 12/06 0701 - 12/07 0700 In: 5643 [I.V.:1445] Out: 660 [Urine:660] Intake/Output this shift: No intake/output data recorded.  General appearance: alert, cooperative and appears stated age Eyes: negative Nose: Nares normal. Septum midline. Mucosa normal. No drainage or sinus tenderness. Throat: lips, mucosa, and tongue normal; teeth and gums normal Neck: supple, symmetrical, trachea midline Back: symmetric, no curvature. ROM normal. No CVA tenderness. Resp: non-labored on room air.  Cardio: Nl rate GI: soft, non-tender; bowel sounds normal; no masses,  no organomegaly and end iliostomy patent of liquid stool and gas Extremities: extremities normal, atraumatic, no cyanosis or edema Skin: Skin color, texture, turgor normal. No rashes or lesions Neurologic: Grossly normal  Lab Results:   Recent Labs  11/01/16 0953 11/02/16 0451  WBC 8.4 12.1*  HGB 11.8* 10.2*  HCT 35.4* 31.2*  PLT 250 237   BMET  Recent Labs  10/31/16 2013 11/01/16 0953 11/02/16 0451  NA 128*  --  136  K 4.9  --  3.6  CL 102  --  115*  CO2 14*  --  14*  GLUCOSE 110*  --  132*  BUN 36*  --  26*  CREATININE 4.15* 2.79* 2.12*  CALCIUM 8.8*  --  8.1*   PT/INR No results for input(s): LABPROT, INR in the last 72 hours. ABG No results for input(s): PHART, HCO3 in the last 72 hours.  Invalid input(s): PCO2, PO2  Studies/Results: Ct Abdomen Pelvis Wo Contrast  Result Date: 10/31/2016 CLINICAL DATA:  Nausea, vomiting, left-sided abdominal pain x5  days. Recent urinary tract infection. History of colon cancer with partial colectomy, ileostomy and appendectomy. EXAM: CT ABDOMEN AND PELVIS WITHOUT CONTRAST TECHNIQUE: Multidetector CT imaging of the abdomen and pelvis was performed following the standard protocol without IV contrast. COMPARISON:  07/03/2016 FINDINGS: Lower chest: No acute abnormality. Hepatobiliary: No focal liver abnormality is seen. No gallstones,  gallbladder wall thickening, or biliary dilatation. Pancreas: Unremarkable. No pancreatic ductal dilatation or surrounding inflammatory changes. Spleen: Normal in size without focal abnormality. Adrenals/Urinary Tract: Normal bilateral adrenal glands. Unchanged 1 x 1.1 x 0.9 cm right renal pelvic stone with mild hydronephrosis. Nonobstructing interpolar and left lower pole renal calculi are identified with mild left-sided hydronephrosis since previous exam. No obstructing calculus is seen. Left interpolar posterior subtle hypodensity measuring 1 cm in is unchanged likely to reflect a cyst. There is a punctate density within the bladder possibly representing a passed stone, series 2, image 86. Stomach/Bowel: Right lower quadrant ileostomy. Partial colectomy on the right. No bowel obstruction or inflammation. Vascular/Lymphatic: No significant vascular findings are present. No enlarged abdominal or pelvic lymph nodes. Reproductive: Uterus and bilateral adnexa are unremarkable. Other: Stable pelvic calcifications consistent with phleboliths. Small amount of free fluid in cul-de-sac. Musculoskeletal: No acute abnormality IMPRESSION: 1. 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 Nonobstructing interpolar and lower pole left-sided renal calculi with mild hydronephrosis. Punctate calcification is is suggested within the bladder which may represent a recently passed stone. Stable left renal cyst. 2. No acute bowel inflammation. Right lower quadrant ileostomy partial right-sided colectomy. Electronically Signed   By: Ashley Royalty M.D.   On: 10/31/2016 23:24    Anti-infectives: Anti-infectives    Start     Dose/Rate Route Frequency Ordered Stop   11/01/16 1000  cefTRIAXone (ROCEPHIN) 1 g in dextrose 5 % 50 mL IVPB     1 g 100 mL/hr over 30 Minutes Intravenous Every 24 hours 11/01/16 0906        Assessment/Plan:  1 -  Recurrent Nephrolithiasis -  now s/p stenting. No further stone management this admission. Our office will contact pt to arrange elective outpatient ureteroscopy in few weeks.   2 -  Acute Renal Failure - likely multifactorial with some component pre-renal dehydration with high output ilostomy + some ? Obstructive componenet with stones + bactrim toxicity.  This is resolving quickly.   3 - Chronic Mild Hydronephrosis - likely from some intermitant obstruction for stoens, now s/p stenting.   4 -  Medical Stone Disease - this is likely due to relative hyperoxaluira from bowel mal-absorption. Future metabolic eval to confirm. Would likely benefits tom CaCO TID meals if this is case.  5 - Bacteruria - UCX negative / final. This will make future stone surgery safer now bacteruria resolved.  PT OK for DC from GU perspective as soon as 12/8. We will arrange f/u as per above.    Please call me directly with questions anytime.    [facility], Nilda Keathley 11/02/2016

## 2016-11-02 NOTE — Op Note (Signed)
NAMENOVIA, LANSBERRY NO.:  [id number]  MEDICAL RECORD NO.:  [id number]  LOCATION:  31                         FACILITY:  [facility]  PHYSICIAN:  Alexis Frock, MD     DATE OF BIRTH:  1991-07-24  DATE OF PROCEDURE:                               OPERATIVE REPORT   DIAGNOSES:  Bilateral renal stones; acute renal failure, questionable; and bilateral ureteral obstruction.  PROCEDURE: 1. Cystoscopy with bilateral retrograde pyelogram with interpretation. 2. Insertion of bilateral ureteral stents, 5 x 22, Polaris, no tether.  ESTIMATED BLOOD LOSS:  Nil.  COMPLICATIONS:  None.  SPECIMEN:  None.  FINDINGS: 1. Proteinaceous and cloudy appearing urine in bladder, some erythema     consistent with likely cystitis. 2. Mild right hydronephrosis with mobile filling defect consistent     with stone in the right renal pelvis. 3. Somewhat bifid right kidney. 4. Successful placement of right ureteral stent, proximal end in the     renal pelvis and distal end in the urinary bladder. 5. No left hydroureteronephrosis or filling defects. 6. Successful placement of left ureteral stent, proximal end in the     left renal pelvis and distal end in the urinary bladder.  INDICATION:  Ms. Gierke is an unfortunate 25 year old young woman, with history of familial colon cancer syndrome and prior colon cancer, status post complete colectomy with end ileostomy.  She has had infrequent bouts of dehydration from this.  She has recurrent nephrolithiasis as well.  She was found on evaluation of abdominal pain and significant acute renal failure, to have bilateral renal stones, minimal right hydronephrosis, and some bacteriuria concerning for possible and bilateral renal obstruction as contributory to her acute renal failure.  Options were discussed for management including hydration alone and observation versus most aggressive therapy with bilateral ureteral stenting with likely  subsequent bilateral ureteroscopy with goal of stone free in several weeks, and she wished to proceed with the latter.  This was recommended as she is fertile age female and does desire future elective pregnancies and it would be most advantageous to achieve stone free status prior.  Informed consent was obtained and placed in the medical record.  PROCEDURE IN DETAIL:  The patient being [employer] verified. Procedure being bilateral ureteral stent placement was confirmed. Procedure was carried out.  Time-out was performed.  Intravenous antibiotics were administered.  General anesthesia was introduced.  The patient was placed into a low lithotomy position.  Sterile field was created by prepping and draping the patient's vagina, introitus, and proximal thighs using iodine.  Next, cystourethroscopy was performed using a 21-French rigid cystoscope with offset lens.  Inspection of bladder revealed some mild erythema and some proteinaceous urine consistent with likely recent cystitis as per history.  Ureteral orifices appeared singleton bilaterally.  The right ureteral orifice was cannulated with 6-French catheter and right retrograde pyelogram was obtained.  Right retrograde pyelogram demonstrated a single right ureter, with somewhat bifid right kidney.  There was a mobile filling defect in the right renal pelvis consistent with known stone.  There was mild hydronephrosis without ureteronephrosis.  A 0.038 Sensor wire was advanced to the level of the upper pole, over which, a  new 5 x 22 Polaris-type stent was placed using cystoscopic and fluoroscopic guidance.  Good proximal and distal deployment were noted.  Similarly, left retrograde pyelogram was obtained.  Left retrograde pyelogram demonstrated single left ureter with single- system left kidney.  No filling defects or narrowing or hydronephrosis noted whatsoever.  There was somewhat bifid appearance.  Next, a 0.038 Sensor wire was  advanced to the level of the upper pole, over which, a new 5 x 22 Polaris-type stent was placed using cystoscopic and fluoroscopic guidance.  Good proximal distal deployment were noted. Efflux of urine was seen around into the distal end of bilateral stents. This was not grossly purulent.  Bladder was emptied per cystoscope. Procedure was then terminated.  The patient tolerated the procedure well with no immediate periprocedural complications.  The patient was taken to the postanesthesia care unit in stable condition.          ______________________________ Alexis Frock, MD     TM/MEDQ  D:  11/01/2016  T:  11/02/2016  Job:  [id number]

## 2016-11-03 DIAGNOSIS — D126 Benign neoplasm of colon, unspecified: Secondary | ICD-10-CM

## 2016-11-03 DIAGNOSIS — E860 Dehydration: Secondary | ICD-10-CM

## 2016-11-03 DIAGNOSIS — R198 Other specified symptoms and signs involving the digestive system and abdomen: Secondary | ICD-10-CM

## 2016-11-03 DIAGNOSIS — Z932 Ileostomy status: Secondary | ICD-10-CM

## 2016-11-03 DIAGNOSIS — C189 Malignant neoplasm of colon, unspecified: Secondary | ICD-10-CM

## 2016-11-03 LAB — CBC
HCT: 30 % — ABNORMAL LOW (ref 36.0–46.0)
Hemoglobin: 9.8 g/dL — ABNORMAL LOW (ref 12.0–15.0)
MCH: 27.6 pg (ref 26.0–34.0)
MCHC: 32.7 g/dL (ref 30.0–36.0)
MCV: 84.5 fL (ref 78.0–100.0)
PLATELETS: 215 10*3/uL (ref 150–400)
RBC: 3.55 MIL/uL — AB (ref 3.87–5.11)
RDW: 13.8 % (ref 11.5–15.5)
WBC: 10.4 10*3/uL (ref 4.0–10.5)

## 2016-11-03 LAB — BASIC METABOLIC PANEL
Anion gap: 4 — ABNORMAL LOW (ref 5–15)
BUN: 16 mg/dL (ref 6–20)
CALCIUM: 8 mg/dL — AB (ref 8.9–10.3)
CHLORIDE: 120 mmol/L — AB (ref 101–111)
CO2: 16 mmol/L — ABNORMAL LOW (ref 22–32)
CREATININE: 1.65 mg/dL — AB (ref 0.44–1.00)
GFR, EST AFRICAN AMERICAN: 48 mL/min — AB (ref >60)
GFR, EST NON AFRICAN AMERICAN: 42 mL/min — AB (ref >60)
Glucose, Bld: 103 mg/dL — ABNORMAL HIGH (ref 65–99)
Potassium: 3.6 mmol/L (ref 3.5–5.1)
SODIUM: 140 mmol/L (ref 135–145)

## 2016-11-03 MED ORDER — HYDROCODONE-ACETAMINOPHEN 5-325 MG PO TABS: 1.0000 | ORAL_TABLET | ORAL | 0 refills | Status: DC | PRN

## 2016-11-03 MED ORDER — HYDROCODONE-ACETAMINOPHEN 5-325 MG PO TABS: 1.0000 | ORAL_TABLET | Freq: Four times a day (QID) | ORAL | 0 refills | Status: DC | PRN

## 2016-11-03 NOTE — Progress Notes (Signed)
Discharge instructions given to pt, verbalized understanding. Left the unit in stable condition. 

## 2016-11-03 NOTE — Discharge Summary (Addendum)
Physician Discharge Summary  Cindy [name] NFA:[id number] DOB: [birth year] DOA: 10/31/2016  PCP: [facility] Nephrologist: Claudette Stapler  Admit date: 10/31/2016 Discharge date: 11/03/2016  Admitted From: home  Disposition:  home   Recommendations for Outpatient Follow-up:  1. Urology to recheck Bmet in 1 -2 wks  Discharge Condition:  stable   CODE STATUS:  Full code   Diet recommendation:  Heart helthy Consultations:  Urology    Discharge Diagnoses:  Principal Problem:   Acute kidney injury ([place]) Active Problems:   Adenocarcinoma of colon ([place])   Dehydration   Metabolic acidosis   Anemia, iron deficiency   High output ileostomy (HCC)   FAP (familial adenomatous polyposis)   Nephrolithiasis   Arterial hypotension    Subjective: Eating breakfast- doing well with grits without nausea. Has diffuse abdominal discomfort worse with her hiccups.   Brief Summary: Patient is a 25 year old female with a history of colon cancer s/p resection,  short gut syndrome, high output ileostomy, chronic kidney disease with baseline creatinine 1.2, recurrent nephrolithiasis presented to [facility] with worsening nausea, vomiting and left-sided abdominal pain in the last 2 days. History was obtained from the patient who reported that she had gone to OB/GYN on Friday 5 days ago and was diagnosed with UTI. She states her symptoms were burning and increased frequency.  She was started on Bactrim. After starting Bactrim, she started having abdominal pain with vomiting. Last night her pain was 10 out of 10, sharp and intermittent, left upper quadrant. No fevers or chills or hematemesis, melena. No chest pain or shortness of breath. She had multiple episodes of vomiting, per patient about 10.  Hospital Course:  Principal Problem:   Acute-on-chronic kidney injury, dehydration, hyponatremia and metabolic acidosis - prerenal due to vomiting ? Due to Bactrim-  ? due to  nephrolithiasis and obstruction- imaging actually shows hydronephrosis to be chronic - now resolving - see labs below   Active Problems:   Nephrolithiasis/ b/l hydronephrosis - has a h/o of this- ? Hyperoxaluria but she states to Dr Tresa Moore she has been having this issue prior to ileostomy -  b/l stents placed yesterday- Dr Tresa Moore to follow as outpt to determine cause of nephorlithiasis - UA mildly + - on Rocephin but culture neg- [place] d/c'd after discussion with Dr Tresa Moore  Vomiting -? Due to bactrim vs nephrolithiasis - resolved   Abdominal pain - likely soreness due to severity of vomiting prior to admission- still having hiccups that are exacerbating pain - asked her to use heating pain and use only Vicodin sparingly which I have prescribed.  High output Ileostomy due to colon resection for colon cancer in 2016- Familial adenomatous polyposis - on Imodium for this     Anemia - follow    Arterial hypotension - resolved- ? Due to dehydration- may be chronically low and physiologically normal for her   Discharge Instructions  Discharge Instructions    Discharge instructions    Complete by:  As directed    Soft easy to digest diet   Increase activity slowly    Complete by:  As directed        Medication List    TAKE these medications   citalopram 10 MG tablet Commonly known as:  CELEXA Take 10 mg by mouth at bedtime.   cyanocobalamin 1000 MCG/ML injection Commonly known as:  (VITAMIN B-12) Inject 1,000 mcg into the muscle every 30 (thirty) days.   HYDROcodone-acetaminophen 5-325 MG tablet Commonly known  as:  NORCO/VICODIN Take 1 tablet by mouth every 6 (six) hours as needed for moderate pain.   LORazepam 1 MG tablet Commonly known as:  ATIVAN Take 1 mg by mouth 3 (three) times daily as needed for anxiety.   magnesium oxide 400 (241.3 Mg) MG tablet Commonly known as:  MAG-OX Take 400 mg by mouth daily.   MULTIVITAMIN GUMMIES ADULT Chew Chew 1 each by  mouth daily.   ondansetron 8 MG disintegrating tablet Commonly known as:  ZOFRAN ODT Take 1 tablet (8 mg total) by mouth every 8 (eight) hours as needed for nausea or vomiting.   potassium chloride 20 MEQ packet Commonly known as:  KLOR-CON Take 40 mEq by mouth 2 (two) times daily.      Follow-up Information    Alexis Frock, MD Follow up.   Specialty:  Urology Why:  call his office for any Urinary issues- his office should call you for an appt.  Contact information: [address] [place] [address] [phone]          Allergies  Allergen Reactions  . Venofer [Ferric Oxide] Anaphylaxis  . Soap Hives, Itching and Other (See Comments)    Pt states that she is allergic to [employer].    . Food Hives and Other (See Comments)    Pt states that she is allergic to orange juice.   . Iron Palpitations  . Levaquin [Levofloxacin] Hives     Procedures/Studies: Ct Abdomen Pelvis Wo Contrast  Result Date: 10/31/2016 CLINICAL DATA:  Nausea, vomiting, left-sided abdominal pain x5 days. Recent urinary tract infection. History of colon cancer with partial colectomy, ileostomy and appendectomy. EXAM: CT ABDOMEN AND PELVIS WITHOUT CONTRAST TECHNIQUE: Multidetector CT imaging of the abdomen and pelvis was performed following the standard protocol without IV contrast. COMPARISON:  07/03/2016 FINDINGS: Lower chest: No acute abnormality. Hepatobiliary: No focal liver abnormality is seen. No gallstones, gallbladder wall thickening, or biliary dilatation. Pancreas: Unremarkable. No pancreatic ductal dilatation or surrounding inflammatory changes. Spleen: Normal in size without focal abnormality. Adrenals/Urinary Tract: Normal bilateral adrenal glands. Unchanged 1 x 1.1 x 0.9 cm right renal pelvic stone with mild hydronephrosis. Nonobstructing interpolar and left lower pole renal calculi are identified with mild left-sided hydronephrosis since previous exam. No obstructing calculus is seen. Left  interpolar posterior subtle hypodensity measuring 1 cm in is unchanged likely to reflect a cyst. There is a punctate density within the bladder possibly representing a passed stone, series 2, image 86. Stomach/Bowel: Right lower quadrant ileostomy. Partial colectomy on the right. No bowel obstruction or inflammation. Vascular/Lymphatic: No significant vascular findings are present. No enlarged abdominal or pelvic lymph nodes. Reproductive: Uterus and bilateral adnexa are unremarkable. Other: Stable pelvic calcifications consistent with phleboliths. Small amount of free fluid in cul-de-sac. Musculoskeletal: No acute abnormality IMPRESSION: 1. 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 Nonobstructing interpolar and lower pole left-sided renal calculi with mild hydronephrosis. Punctate calcification is is suggested within the bladder which may represent a recently passed stone. Stable left renal cyst. 2. No acute bowel inflammation. Right lower quadrant ileostomy partial right-sided colectomy. Electronically Signed   By: Ashley Royalty M.D.   On: 10/31/2016 23:24       Discharge Exam: Vitals:   11/02/16 2120 11/03/16 0552  BP: (!) 89/47 (!) 94/55  Pulse: (!) 114 (!) 103  Resp: 16 16  Temp: 98.1 F (36.7  C) 98.2 F (36.8 C)   Vitals:   11/02/16 0550 11/02/16 1422 11/02/16 2120 11/03/16 0552  BP: (!) 96/57 102/60 (!) 89/47 (!) 94/55  Pulse: (!) 107 (!) 106 (!) 114 (!) 103  Resp: 16 16 16 16   Temp: 98.1 F (36.7 C) 98.2 F (36.8 C) 98.1 F (36.7 C) 98.2 F (36.8 C)  TempSrc: Oral Oral Oral Oral  SpO2: 100% 100% 100% 100%  Weight:      Height:        General: Pt is alert, awake, not in acute distress Cardiovascular: RRR, S1/S2 +, no rubs, no gallops Respiratory: CTA bilaterally, no wheezing, no rhonchi Abdominal: Soft, NT, ND, bowel sounds + Extremities: no edema, no cyanosis    The results  of significant diagnostics from this hospitalization (including imaging, microbiology, ancillary and laboratory) are listed below for reference.     Microbiology: Recent Results (from the past 240 hour(s))  Urine culture     Status: None   Collection Time: 11/01/16 10:02 AM  Result Value Ref Range Status   Specimen Description URINE, CLEAN CATCH  Final   Special Requests NONE  Final   Culture NO GROWTH Performed at [facility]   Final   Report Status 11/02/2016 FINAL  Final     Labs: BNP (last 3 results) No results for input(s): BNP in the last 8760 hours. Basic Metabolic Panel:  Recent Labs Lab 10/31/16 2013 11/01/16 0953 11/02/16 0451 11/03/16 0530  NA 128*  --  136 140  K 4.9  --  3.6 3.6  CL 102  --  115* 120*  CO2 14*  --  14* 16*  GLUCOSE 110*  --  132* 103*  BUN 36*  --  26* 16  CREATININE 4.15* 2.79* 2.12* 1.65*  CALCIUM 8.8*  --  8.1* 8.0*   Liver Function Tests:  Recent Labs Lab 10/31/16 2013  AST 43*  ALT 13*  ALKPHOS 76  BILITOT 1.2  PROT 8.9*  ALBUMIN 4.8    Recent Labs Lab 10/31/16 2013  LIPASE 40   No results for input(s): AMMONIA in the last 168 hours. CBC:  Recent Labs Lab 10/31/16 2013 11/01/16 0953 11/02/16 0451 11/03/16 0530  WBC 13.7* 8.4 12.1* 10.4  NEUTROABS 10.4*  --   --   --   HGB 13.4 11.8* 10.2* 9.8*  HCT 38.7 35.4* 31.2* 30.0*  MCV 78.8 81.4 83.0 84.5  PLT 323 250 237 215   Cardiac Enzymes: No results for input(s): CKTOTAL, CKMB, CKMBINDEX, TROPONINI in the last 168 hours. BNP: Invalid input(s): POCBNP CBG: No results for input(s): GLUCAP in the last 168 hours. D-Dimer No results for input(s): DDIMER in the last 72 hours. Hgb A1c No results for input(s): HGBA1C in the last 72 hours. Lipid Profile No results for input(s): CHOL, HDL, LDLCALC, TRIG, CHOLHDL, LDLDIRECT in the last 72 hours. Thyroid function studies No results for input(s): TSH, T4TOTAL, T3FREE, THYROIDAB in the last 72  hours.  Invalid input(s): FREET3 Anemia work up No results for input(s): VITAMINB12, FOLATE, FERRITIN, TIBC, IRON, RETICCTPCT in the last 72 hours. Urinalysis    Component Value Date/Time   COLORURINE STRAW (A) 11/01/2016 1002   APPEARANCEUR CLEAR 11/01/2016 1002   LABSPEC 1.009 11/01/2016 1002   PHURINE 5.0 11/01/2016 1002   GLUCOSEU NEGATIVE 11/01/2016 1002   HGBUR NEGATIVE 11/01/2016 [place] 11/01/2016 1002   KETONESUR NEGATIVE 11/01/2016 1002   PROTEINUR NEGATIVE 11/01/2016 1002   UROBILINOGEN 0.2 07/29/2015 0320  NITRITE NEGATIVE 11/01/2016 1002   LEUKOCYTESUR NEGATIVE 11/01/2016 1002   Sepsis Labs Invalid input(s): PROCALCITONIN,  WBC,  LACTICIDVEN Microbiology Recent Results (from the past 240 hour(s))  Urine culture     Status: None   Collection Time: 11/01/16 10:02 AM  Result Value Ref Range Status   Specimen Description URINE, CLEAN CATCH  Final   Special Requests NONE  Final   Culture NO GROWTH Performed at [facility]   Final   Report Status 11/02/2016 FINAL  Final     Time coordinating discharge: Over 30 minutes  SIGNED:   Debbe Odea, MD  [facility] 11/03/2016, 10:20 AM Pager   If 7PM-7AM, please contact night-coverage [email or web address] Password [email or web address]

## 2016-11-03 NOTE — Care Management Note (Signed)
Case Management Note  Patient Details  Name: KRYSTIANNA SOTH MRN: [id number] Date of Birth: Oct 26, 1991  Subjective/Objective:                    Action/Plan:d/c home no needs or orders.   Expected Discharge Date:                  Expected Discharge Plan:  Home/Self Care  In-House Referral:     Discharge planning Services  CM Consult  Post Acute Care Choice:    Choice offered to:     DME Arranged:    DME Agency:     HH Arranged:    [place] Agency:     Status of Service:  Completed, signed off  If discussed at [employer] of Stay Meetings, dates discussed:    Additional Comments:  Dessa Phi, RN 11/03/2016, 10:21 AM

## 2016-11-04 ENCOUNTER — Emergency Department (HOSPITAL_COMMUNITY): Payer: PRIVATE HEALTH INSURANCE

## 2016-11-04 ENCOUNTER — Encounter (HOSPITAL_COMMUNITY): Payer: Self-pay | Admitting: *Deleted

## 2016-11-04 ENCOUNTER — Emergency Department (HOSPITAL_COMMUNITY)
Admission: EM | Admit: 2016-11-04 | Discharge: 2016-11-05 | Disposition: A | Discharge status: Home / Self Care | Payer: PRIVATE HEALTH INSURANCE | Attending: Emergency Medicine | Admitting: Emergency Medicine

## 2016-11-04 DIAGNOSIS — Z79899 Other long term (current) drug therapy: Secondary | ICD-10-CM | POA: Diagnosis not present

## 2016-11-04 DIAGNOSIS — Z85038 Personal history of other malignant neoplasm of large intestine: Secondary | ICD-10-CM | POA: Diagnosis not present

## 2016-11-04 DIAGNOSIS — R109 Unspecified abdominal pain: Secondary | ICD-10-CM | POA: Insufficient documentation

## 2016-11-04 LAB — I-STAT CHEM 8, ED
BUN: 9 mg/dL (ref 6–20)
CALCIUM ION: 1.16 mmol/L (ref 1.15–1.40)
CHLORIDE: 112 mmol/L — AB (ref 101–111)
Creatinine, Ser: 1.5 mg/dL — ABNORMAL HIGH (ref 0.44–1.00)
GLUCOSE: 96 mg/dL (ref 65–99)
HCT: 31 % — ABNORMAL LOW (ref 36.0–46.0)
Hemoglobin: 10.5 g/dL — ABNORMAL LOW (ref 12.0–15.0)
Potassium: 3.4 mmol/L — ABNORMAL LOW (ref 3.5–5.1)
Sodium: 140 mmol/L (ref 135–145)
TCO2: 17 mmol/L (ref 0–100)

## 2016-11-04 LAB — I-STAT BETA HCG BLOOD, ED (MC, WL, AP ONLY): I-stat hCG, quantitative: 10.5 m[IU]/mL — ABNORMAL HIGH (ref <5)

## 2016-11-04 LAB — URINALYSIS, ROUTINE W REFLEX MICROSCOPIC
Bilirubin Urine: NEGATIVE
Glucose, UA: NEGATIVE mg/dL
KETONES UR: NEGATIVE mg/dL
Nitrite: NEGATIVE
PH: 6 (ref 5.0–8.0)
PROTEIN: 100 mg/dL — AB
Specific Gravity, Urine: 1.013 (ref 1.005–1.030)

## 2016-11-04 LAB — PREGNANCY, URINE: Preg Test, Ur: NEGATIVE

## 2016-11-04 MED ORDER — OXYCODONE-ACETAMINOPHEN 5-325 MG PO TABS: 1.0000 | ORAL_TABLET | ORAL | 0 refills | Status: DC | PRN

## 2016-11-04 MED ORDER — OXYCODONE-ACETAMINOPHEN 5-325 MG PO TABS
2.0000 | ORAL_TABLET | Freq: Once | ORAL | Status: AC
  Administered 2016-11-04: 2 via ORAL
  Filled 2016-11-04: qty 2

## 2016-11-04 MED ORDER — FENTANYL CITRATE (PF) 100 MCG/2ML IJ SOLN
50.0000 ug | Freq: Once | INTRAMUSCULAR | Status: DC
  Filled 2016-11-04: qty 2

## 2016-11-04 MED ORDER — HYDROMORPHONE HCL 1 MG/ML IJ SOLN
1.0000 mg | Freq: Once | INTRAMUSCULAR | Status: AC
  Administered 2016-11-04: 1 mg via INTRAVENOUS
  Filled 2016-11-04: qty 1

## 2016-11-04 MED ORDER — CEPHALEXIN 500 MG PO CAPS: 500.0000 mg | ORAL_CAPSULE | Freq: Three times a day (TID) | ORAL | 0 refills | Status: DC

## 2016-11-04 MED ORDER — SODIUM CHLORIDE 0.9 % IV BOLUS (SEPSIS)
500.0000 mL | Freq: Once | INTRAVENOUS | Status: AC
  Administered 2016-11-04: 500 mL via INTRAVENOUS

## 2016-11-04 NOTE — ED Notes (Signed)
Pt reminded that we need a urine sample.

## 2016-11-04 NOTE — ED Provider Notes (Signed)
[facility] DEPT Provider Note   CSN: [id number] Arrival date & time: 11/04/16  1756   By signing my name below, I, Eunice Blase, attest that this documentation has been prepared under the direction and in the presence of Gloriann Loan, PA-C. Electronically Signed: Eunice Blase, Scribe. 11/05/16. 1:03 AM.   History   Chief Complaint Chief Complaint  Patient presents with  . Flank Pain  . Post-op Problem   The history is provided by the patient. No language interpreter was used.    HPI Comments:   Patient presents with persistent bilateral flank pain. Patient was admitted 4 days ago for acute on chronic kidney injury due to hydronephrosis and nephrolithiasis along with dehydration. Urology was consulted and she had bilateral stents placed. Prior to admission she was on Bactrim. This was thought to be a compounding factor in her acute on chronic kidney injury. She received IV Rocephin. She is not currently on any antibiotics. She was discharged yesterday, and presents today for persistent bilateral uncontrolled flank pain. She has been taking prescribed hydrocodone without any relief. Associated symptoms include resolved emesis, dysuria, and hematuria. She denies fever, chills, or diarrhea. She has a nephrology follow-up appointment at [facility] on Monday. She is awaiting urology phone call to schedule follow-up.   Nephrology: Claudette Stapler [facility] Urology: Alexis Frock, [place]  Blood pressure 92/59, pulse 91, temperature 98.9 F (37.2 C), temperature source Oral, resp. rate 16, height 5\' 1"  (1.549 m), weight 49 kg, last menstrual period 10/04/2016, SpO2 100 %.  BP appears to be her baseline.  Past Medical History:  Diagnosis Date  . Cancer ([place])   . Colon cancer ([place]) 2014   s/p resection and ileostomy  . Colon cancer ([place])   . Complication of anesthesia    "I swallowed the anesthesia when appendix taken out; got pneumonia"  . FAP (familial adenomatous polyposis) 09/30/2014    . Heart murmur   . History of blood transfusion 04/26/2013   "4 bags" (05/21/2013)  . Ileostomy care ([place])   . Iron deficiency anemia   . Nephrolithiasis 09/30/2014  . 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 Pneumonia 2008; 2012  . UTI (lower urinary tract infection)     Patient Active Problem List   Diagnosis Date Noted  . AKI (acute kidney injury) ([place]) 11/01/2016  . Acute kidney injury ([place]) 11/01/2016  . Severe dehydration 01/14/2016  . Acute renal failure ([place]) 01/14/2016  . Hypomagnesemia 07/31/2015  . Normocytic anemia 05/02/2015  . Hyperphosphatemia 05/01/2015  . Arterial hypotension   . Gastroenteritis-likely viral 02/16/2015  . Hypocalcemia 10/01/2014  . High output ileostomy ([place]) 09/30/2014  . FAP (familial adenomatous polyposis) 09/30/2014  . Nephrolithiasis 09/30/2014  . Anemia, iron deficiency 12/27/2013  . Vitamin B12 deficiency anemia 12/27/2013  . Dehydration 12/25/2013  . Hyponatremia 12/25/2013  . Metabolic acidosis 71/69/6789  . Adenocarcinoma of colon ([place]) 05/26/2013  . Colon polyps 05/26/2013  . Nausea with vomiting 05/21/2013  . Hypokalemia 04/25/2013    Past Surgical History:  Procedure Laterality Date  . APPENDECTOMY  2008  . APPENDECTOMY    . COLON SURGERY    . COLONOSCOPY N/A 05/24/2013   Procedure: COLONOSCOPY;  Surgeon: Missy Sabins, MD;  Location: [facility];  Service: Endoscopy;  Laterality: N/A;  . CYSTOSCOPY W/ URETERAL STENT PLACEMENT Bilateral 11/01/2016   Procedure: CYSTOSCOPY WITH BILATERAL RETROGRADE PYELOGRAM BILATERAL URETERAL STENT PLACEMENT;  Surgeon: Alexis Frock, MD;  Location: [facility];  Service: Urology;  Laterality: Bilateral;  . I&D EXTREMITY Left 11/14/2013   Procedure: IRRIGATION AND DEBRIDEMENT LEFT LONG FINGER;  Surgeon: Tennis Must, MD;  Location: [facility];  Service: Orthopedics;  Laterality: Left;  . ILEOSTOMY  06/26/2013  . ILEOSTOMY    . LEG RECONSTRUCTION USING  FASCIAL FLAP  ~ 2009  . still born baby  [birth year]   delivered vaginally @ ~ 24 weeks    OB History    Gravida Para Term Preterm AB Living   1 1 0 1 0     SAB TAB Ectopic Multiple Live Births   0 0 0           Home Medications    Prior to Admission medications   Medication Sig Start Date End Date Taking? Authorizing Provider  citalopram (CELEXA) 10 MG tablet Take 10 mg by mouth at bedtime.   Yes Historical Provider, MD  cyanocobalamin (,VITAMIN B-12,) 1000 MCG/ML injection Inject 1,000 mcg into the muscle every 30 (thirty) days.   Yes Historical Provider, MD  HYDROcodone-acetaminophen (NORCO/VICODIN) 5-325 MG tablet Take 1 tablet by mouth every 6 (six) hours as needed for moderate pain. 11/03/16  Yes Debbe Odea, MD  LORazepam (ATIVAN) 1 MG tablet Take 1 mg by mouth 3 (three) times daily as needed for anxiety.   Yes Historical Provider, MD  magnesium oxide (MAG-OX) 400 (241.3 Mg) MG tablet Take 400 mg by mouth daily.   Yes Historical Provider, MD  Multiple Vitamins-Minerals (MULTIVITAMIN GUMMIES ADULT) CHEW Chew 1 each by mouth daily.    Yes Historical Provider, MD  ondansetron (ZOFRAN ODT) 8 MG disintegrating tablet Take 1 tablet (8 mg total) by mouth every 8 (eight) hours as needed for nausea or vomiting. 02/17/15  Yes Barton Dubois, MD  potassium chloride (KLOR-CON) 20 MEQ packet Take 40 mEq by mouth 2 (two) times daily. 01/15/16  Yes Lavina Hamman, MD  cephALEXin (KEFLEX) 500 MG capsule Take 1 capsule (500 mg total) by mouth 3 (three) times daily. 11/04/16   Gloriann Loan, PA-C  oxyCODONE-acetaminophen (PERCOCET/ROXICET) 5-325 MG tablet Take 1-2 tablets by mouth every 4 (four) hours as needed for severe pain. 11/04/16   Gloriann Loan, PA-C    Family History Family History  Problem Relation Age of Onset  . Adopted: Yes  . ALS Mother   . Alcohol abuse Neg Hx   . Arthritis Neg Hx   . Asthma Neg Hx   . Birth defects Neg Hx   . Cancer Neg Hx   . COPD Neg Hx   . Depression Neg Hx   .  Diabetes Neg Hx   . Drug abuse Neg Hx   . Early death Neg Hx   . Hearing loss Neg Hx   . Heart disease Neg Hx   . Hyperlipidemia Neg Hx   . Hypertension Neg Hx   . Kidney disease Neg Hx   . Learning disabilities Neg Hx   . Mental illness Neg Hx   . Mental retardation Neg Hx   . Miscarriages / Stillbirths Neg Hx   . Stroke Neg Hx   . Vision loss Neg Hx     Social History Social History  Substance Use Topics  . Smoking status: Never Smoker  . Smokeless tobacco: Never Used  . Alcohol use No     Allergies   Venofer [ferric oxide]; Soap; Food; Iron; and Levaquin [levofloxacin]   Review of Systems Review of Systems A complete 10 system review of systems was obtained and  all systems are negative except as noted in the HPI and PMH.    Physical Exam Updated Vital Signs BP 92/58 (BP Location: Left Arm)   Pulse 94   Temp 98.2 F (36.8 C) (Oral)   Resp 12   Ht 5\' 1"  (1.549 m)   Wt 49 kg   LMP 10/04/2016   SpO2 98%   BMI 20.41 kg/m   Physical Exam  Constitutional: She is oriented to person, place, and time. She appears well-developed and well-nourished.  Non-toxic appearance. She does not have a sickly appearance. She does not appear ill.  HENT:  Head: Normocephalic and atraumatic.  Mouth/Throat: Oropharynx is clear and moist.  Eyes: Conjunctivae and EOM are normal.  Neck: Normal range of motion. Neck supple.  Cardiovascular: Normal rate and regular rhythm.   Pulmonary/Chest: Effort normal and breath sounds normal. No accessory muscle usage or stridor. No respiratory distress. She has no wheezes. She has no rhonchi. She has no rales.  Abdominal: Soft. Bowel sounds are normal. She exhibits no distension. There is tenderness. There is no rebound and no guarding.  Bilateral CVA tenderness.  Ileostomy bag in LLQ appears well.   Musculoskeletal: Normal range of motion.  Lymphadenopathy:    She has no cervical adenopathy.  Neurological: She is alert and oriented to person,  place, and time.  Speech clear without dysarthria.  Skin: Skin is warm and dry.  Psychiatric: She has a normal mood and affect. Her behavior is normal.  Nursing note and vitals reviewed.    ED Treatments / Results  DIAGNOSTIC STUDIES: Oxygen Saturation is 100% on RA, normal by my interpretation.    COORDINATION OF CARE: 1:03 AM Discussed treatment plan with pt at bedside and pt agreed to plan.  Labs (all labs ordered are listed, but only abnormal results are displayed) Labs Reviewed  URINALYSIS, ROUTINE W REFLEX MICROSCOPIC - Abnormal; Notable for the following:       Result Value   Color, Urine RED (*)    APPearance CLOUDY (*)    Hgb urine dipstick LARGE (*)    Protein, ur 100 (*)    Leukocytes, UA LARGE (*)    Bacteria, UA FEW (*)    Squamous Epithelial / LPF 6-30 (*)    All other components within normal limits  I-STAT CHEM 8, ED - Abnormal; Notable for the following:    Potassium 3.4 (*)    Chloride 112 (*)    Creatinine, Ser 1.50 (*)    Hemoglobin 10.5 (*)    HCT 31.0 (*)    All other components within normal limits  URINE CULTURE  PREGNANCY, URINE    EKG  EKG Interpretation None       Radiology Dg Abdomen 1 View  Result Date: 11/04/2016 CLINICAL DATA:  Bilateral flank pain. EXAM: ABDOMEN - 1 VIEW COMPARISON:  CT 10/31/2016 FINDINGS: Bilateral ureteral stents appear satisfactorily positioned. No conclusive calculi along the stent. Bowel gas pattern is normal. IMPRESSION: Ureteral stents appear satisfactorily positioned. No radiographically visible calculi. Electronically Signed   By: Andreas Newport M.D.   On: 11/04/2016 23:18    Procedures Procedures (including critical care time)  Medications Ordered in ED Medications  sodium chloride 0.9 % bolus 500 mL (0 mLs Intravenous Stopped 11/04/16 2128)  HYDROmorphone (DILAUDID) injection 1 mg (1 mg Intravenous Given 11/04/16 2102)  oxyCODONE-acetaminophen (PERCOCET/ROXICET) 5-325 MG per tablet 2 tablet (2  tablets Oral Given 11/04/16 2359)     Initial Impression / Assessment and Plan / ED  Course  I have reviewed the triage vital signs and the nursing notes.  Pertinent labs & imaging results that were available during my care of the patient were reviewed by me and considered in my medical decision making (see chart for details).  Clinical Course    Patient presents with persistent bilateral flank pain after having 2 stents placed 2 days ago.  Taking Norco at home without relief.  No fever or vomiting.  Low suspicion for retroperitoneal abscess.  Vitals stable.  Plan to recheck renal function and UA and control pain.  Renal function stable, Cr 1.5.  UA with TNTC RBCs and WBCs with few bacteria.  Pain controlled.  Tolerating PO without difficulty. Will obtain KUB to assess stent placement.  Spoke with Urology, Dr. Jonny Ruiz, will start Keflex. Follow up urology.  Return precautions discussed.  Stable for discharge.  Case has been discussed with Dr. Tyrone Nine who agrees with the above plan for discharge.   Final Clinical Impressions(s) / ED Diagnoses   Final diagnoses:  Bilateral flank pain   New Prescriptions Discharge Medication List as of 11/04/2016 11:54 PM    START taking these medications   Details  cephALEXin (KEFLEX) 500 MG capsule Take 1 capsule (500 mg total) by mouth 3 (three) times daily., Starting Sat 11/04/2016, Print    oxyCODONE-acetaminophen (PERCOCET/ROXICET) 5-325 MG tablet Take 1-2 tablets by mouth every 4 (four) hours as needed for severe pain., Starting Sat 11/04/2016, Print        I personally performed the services described in this documentation, which was scribed in my presence. The recorded information has been reviewed and is accurate.        Gloriann Loan, PA-C 11/05/16 [place], DO 11/05/16 1721

## 2016-11-04 NOTE — ED Triage Notes (Signed)
Pt complains of bilateral flank pain since placement of stents in her kidneys 2 days ago. Pt states the pain medication prescribed is not relieving her pain. Pt was discharged from the hospital yesterday. Pt last took Vicodin at 1730 today.

## 2016-11-05 NOTE — ED Notes (Signed)
Patient d/c'd self care.  F/U and medications reviewed.  Patient verbalized understanding. 

## 2016-11-06 ENCOUNTER — Other Ambulatory Visit: Payer: Self-pay | Admitting: Urology

## 2016-11-06 LAB — URINE CULTURE: Culture: NO GROWTH

## 2016-11-08 ENCOUNTER — Other Ambulatory Visit: Payer: Self-pay | Admitting: Urology

## 2016-11-12 ENCOUNTER — Emergency Department (HOSPITAL_COMMUNITY)
Admission: EM | Admit: 2016-11-12 | Discharge: 2016-11-12 | Disposition: A | Discharge status: Home / Self Care | Payer: PRIVATE HEALTH INSURANCE | Attending: Emergency Medicine | Admitting: Emergency Medicine

## 2016-11-12 ENCOUNTER — Emergency Department (HOSPITAL_COMMUNITY): Payer: PRIVATE HEALTH INSURANCE

## 2016-11-12 ENCOUNTER — Encounter (HOSPITAL_COMMUNITY): Payer: Self-pay

## 2016-11-12 DIAGNOSIS — R1012 Left upper quadrant pain: Secondary | ICD-10-CM | POA: Insufficient documentation

## 2016-11-12 DIAGNOSIS — R110 Nausea: Secondary | ICD-10-CM

## 2016-11-12 DIAGNOSIS — R109 Unspecified abdominal pain: Secondary | ICD-10-CM

## 2016-11-12 DIAGNOSIS — R112 Nausea with vomiting, unspecified: Secondary | ICD-10-CM | POA: Insufficient documentation

## 2016-11-12 DIAGNOSIS — Z85038 Personal history of other malignant neoplasm of large intestine: Secondary | ICD-10-CM | POA: Insufficient documentation

## 2016-11-12 LAB — URINALYSIS, ROUTINE W REFLEX MICROSCOPIC
Bilirubin Urine: NEGATIVE
Glucose, UA: NEGATIVE mg/dL
Ketones, ur: NEGATIVE mg/dL
NITRITE: NEGATIVE
PROTEIN: 100 mg/dL — AB
SPECIFIC GRAVITY, URINE: 1.013 (ref 1.005–1.030)
pH: 6 (ref 5.0–8.0)

## 2016-11-12 LAB — BASIC METABOLIC PANEL
ANION GAP: 8 (ref 5–15)
BUN: 11 mg/dL (ref 6–20)
CALCIUM: 9.1 mg/dL (ref 8.9–10.3)
CO2: 21 mmol/L — AB (ref 22–32)
CREATININE: 1.37 mg/dL — AB (ref 0.44–1.00)
Chloride: 108 mmol/L (ref 101–111)
GFR calc non Af Amer: 52 mL/min — ABNORMAL LOW (ref >60)
GLUCOSE: 94 mg/dL (ref 65–99)
Potassium: 3.8 mmol/L (ref 3.5–5.1)
Sodium: 137 mmol/L (ref 135–145)

## 2016-11-12 LAB — CBC WITH DIFFERENTIAL/PLATELET
BASOS ABS: 0 10*3/uL (ref 0.0–0.1)
BASOS PCT: 0 %
EOS PCT: 4 %
Eosinophils Absolute: 0.4 10*3/uL (ref 0.0–0.7)
HCT: 35.2 % — ABNORMAL LOW (ref 36.0–46.0)
Hemoglobin: 11.5 g/dL — ABNORMAL LOW (ref 12.0–15.0)
Lymphocytes Relative: 22 %
Lymphs Abs: 2 10*3/uL (ref 0.7–4.0)
MCH: 27.3 pg (ref 26.0–34.0)
MCHC: 32.7 g/dL (ref 30.0–36.0)
MCV: 83.6 fL (ref 78.0–100.0)
MONO ABS: 0.6 10*3/uL (ref 0.1–1.0)
MONOS PCT: 6 %
Neutro Abs: 6.2 10*3/uL (ref 1.7–7.7)
Neutrophils Relative %: 68 %
PLATELETS: 348 10*3/uL (ref 150–400)
RBC: 4.21 MIL/uL (ref 3.87–5.11)
RDW: 13.4 % (ref 11.5–15.5)
WBC: 9.2 10*3/uL (ref 4.0–10.5)

## 2016-11-12 MED ORDER — ONDANSETRON 4 MG PO TBDP: 4.0000 mg | ORAL_TABLET | Freq: Three times a day (TID) | ORAL | 0 refills | Status: DC | PRN

## 2016-11-12 MED ORDER — SODIUM CHLORIDE 0.9 % IV BOLUS (SEPSIS)
1000.0000 mL | Freq: Once | INTRAVENOUS | Status: AC
Start: 2016-11-12 — End: 2016-11-12
  Administered 2016-11-12: 1000 mL via INTRAVENOUS

## 2016-11-12 MED ORDER — ONDANSETRON HCL 4 MG/2ML IJ SOLN
4.0000 mg | Freq: Once | INTRAMUSCULAR | Status: AC
  Administered 2016-11-12: 4 mg via INTRAVENOUS
  Filled 2016-11-12: qty 2

## 2016-11-12 MED ORDER — SODIUM CHLORIDE 0.9 % IV SOLN
Freq: Once | INTRAVENOUS | Status: AC
  Administered 2016-11-12: 08:00:00 via INTRAVENOUS

## 2016-11-12 MED ORDER — HYDROCODONE-ACETAMINOPHEN 5-325 MG PO TABS
1.0000 | ORAL_TABLET | Freq: Once | ORAL | Status: AC
  Administered 2016-11-12: 1 via ORAL
  Filled 2016-11-12: qty 1

## 2016-11-12 MED ORDER — MORPHINE SULFATE (PF) 4 MG/ML IV SOLN
4.0000 mg | INTRAVENOUS | Status: DC | PRN
  Administered 2016-11-12: 4 mg via INTRAVENOUS
  Filled 2016-11-12: qty 1

## 2016-11-12 MED ORDER — CEPHALEXIN 500 MG PO CAPS
500.0000 mg | ORAL_CAPSULE | Freq: Once | ORAL | Status: AC
  Administered 2016-11-12: 500 mg via ORAL
  Filled 2016-11-12: qty 1

## 2016-11-12 MED ORDER — HYDROCODONE-ACETAMINOPHEN 5-325 MG PO TABS: 1.0000 | ORAL_TABLET | ORAL | 0 refills | Status: DC | PRN

## 2016-11-12 NOTE — ED Provider Notes (Signed)
[place] DEPT Provider Note   CSN: [id number] Arrival date & time: 11/12/16  4132     History   Chief Complaint Chief Complaint  Patient presents with  . Post-op Problem    HPI Cindy [name] is a 25 y.o. female.  She was initially wish and abdominal pain and nausea and vomiting. Pain is primary a left flank. Recent diagnosis of kidney stones. Seen by [facility] of urology. Had stents placed on the sixth of this month. Has planned procedures for lithotripsy and removal stents in January. Was on pain medication until yesterday. Awakened with worsening pain and nausea and vomiting presents here. Some bloody urine. No fevers no chills. No dysuria.  HPI  Past Medical History:  Diagnosis Date  . Cancer ([place])   . Colon cancer ([place]) 2014   s/p resection and ileostomy  . Colon cancer ([place])   . Complication of anesthesia    "I swallowed the anesthesia when appendix taken out; got pneumonia"  . FAP (familial adenomatous polyposis) 09/30/2014  . Heart murmur   . History of blood transfusion 04/26/2013   "4 bags" (05/21/2013)  . Ileostomy care ([place])   . Iron deficiency anemia   . Nephrolithiasis 09/30/2014  . 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 Pneumonia 2008; 2012  . UTI (lower urinary tract infection)     Patient Active Problem List   Diagnosis Date Noted  . AKI (acute kidney injury) ([place]) 11/01/2016  . Acute kidney injury ([place]) 11/01/2016  . Severe dehydration 01/14/2016  . Acute renal failure ([place]) 01/14/2016  . Hypomagnesemia 07/31/2015  . Normocytic anemia 05/02/2015  . Hyperphosphatemia 05/01/2015  . Arterial hypotension   . Gastroenteritis-likely viral 02/16/2015  . Hypocalcemia 10/01/2014  . High output ileostomy ([place]) 09/30/2014  . FAP (familial adenomatous polyposis) 09/30/2014  . Nephrolithiasis 09/30/2014  . Anemia, iron deficiency 12/27/2013  . Vitamin B12 deficiency anemia 12/27/2013  . Dehydration 12/25/2013  .  Hyponatremia 12/25/2013  . Metabolic acidosis 44/11/270  . Adenocarcinoma of colon ([place]) 05/26/2013  . Colon polyps 05/26/2013  . Nausea with vomiting 05/21/2013  . Hypokalemia 04/25/2013    Past Surgical History:  Procedure Laterality Date  . APPENDECTOMY  2008  . APPENDECTOMY    . COLON SURGERY    . COLONOSCOPY N/A 05/24/2013   Procedure: COLONOSCOPY;  Surgeon: Missy Sabins, MD;  Location: [facility];  Service: Endoscopy;  Laterality: N/A;  . CYSTOSCOPY W/ URETERAL STENT PLACEMENT Bilateral 11/01/2016   Procedure: CYSTOSCOPY WITH BILATERAL RETROGRADE PYELOGRAM BILATERAL URETERAL STENT PLACEMENT;  Surgeon: Alexis Frock, MD;  Location: [facility];  Service: Urology;  Laterality: Bilateral;  . I&D EXTREMITY Left 11/14/2013   Procedure: IRRIGATION AND DEBRIDEMENT LEFT LONG FINGER;  Surgeon: Tennis Must, MD;  Location: [facility];  Service: Orthopedics;  Laterality: Left;  . ILEOSTOMY  06/26/2013  . ILEOSTOMY    . LEG RECONSTRUCTION USING FASCIAL FLAP  ~ 2009  . still born baby  [birth year]   delivered vaginally @ ~ 24 weeks    OB History    Gravida Para Term Preterm AB Living   1 1 0 1 0     SAB TAB Ectopic Multiple Live Births   0 0 0           Home Medications    Prior to Admission medications   Medication Sig Start Date End Date Taking? Authorizing Provider  citalopram (CELEXA) 10 MG tablet Take 10 mg by mouth  at bedtime.   Yes Historical Provider, MD  cyanocobalamin (,VITAMIN B-12,) 1000 MCG/ML injection Inject 1,000 mcg into the muscle every 30 (thirty) days.   Yes Historical Provider, MD  LORazepam (ATIVAN) 1 MG tablet Take 1 mg by mouth 3 (three) times daily as needed for anxiety.   Yes Historical Provider, MD  magnesium oxide (MAG-OX) 400 (241.3 Mg) MG tablet Take 400 mg by mouth daily.   Yes Historical Provider, MD  Multiple Vitamins-Minerals (MULTIVITAMIN GUMMIES ADULT) CHEW Chew 1 each by mouth daily.    Yes Historical Provider, MD  potassium chloride  20 MEQ/15ML (10%) SOLN Take 20 mLs by mouth 4 (four) times daily.    Yes Historical Provider, MD  cephALEXin (KEFLEX) 500 MG capsule Take 1 capsule (500 mg total) by mouth 3 (three) times daily. Patient not taking: Reported on 11/12/2016 11/04/16   Gloriann Loan, PA-C  HYDROcodone-acetaminophen (NORCO/VICODIN) 5-325 MG tablet Take 1 tablet by mouth every 4 (four) hours as needed. 11/12/16   Tanna Furry, MD  ondansetron (ZOFRAN ODT) 4 MG disintegrating tablet Take 1 tablet (4 mg total) by mouth every 8 (eight) hours as needed for nausea. 11/12/16   Tanna Furry, MD  oxyCODONE-acetaminophen (PERCOCET/ROXICET) 5-325 MG tablet Take 1-2 tablets by mouth every 4 (four) hours as needed for severe pain. Patient not taking: Reported on 11/12/2016 11/04/16   Gloriann Loan, PA-C  potassium chloride (KLOR-CON) 20 MEQ packet Take 40 mEq by mouth 2 (two) times daily. Patient not taking: Reported on 11/12/2016 01/15/16   Lavina Hamman, MD    Family History Family History  Problem Relation Age of Onset  . Adopted: Yes  . ALS Mother   . Alcohol abuse Neg Hx   . Arthritis Neg Hx   . Asthma Neg Hx   . Birth defects Neg Hx   . Cancer Neg Hx   . COPD Neg Hx   . Depression Neg Hx   . Diabetes Neg Hx   . Drug abuse Neg Hx   . Early death Neg Hx   . Hearing loss Neg Hx   . Heart disease Neg Hx   . Hyperlipidemia Neg Hx   . Hypertension Neg Hx   . Kidney disease Neg Hx   . Learning disabilities Neg Hx   . Mental illness Neg Hx   . Mental retardation Neg Hx   . Miscarriages / Stillbirths Neg Hx   . Stroke Neg Hx   . Vision loss Neg Hx     Social History Social History  Substance Use Topics  . Smoking status: Never Smoker  . Smokeless tobacco: Never Used  . Alcohol use No     Allergies   Venofer [ferric oxide]; Soap; Food; Sulfamethoxazole-trimethoprim; Iron; and Levaquin [levofloxacin]   Review of Systems Review of Systems  Constitutional: Negative for appetite change, chills, diaphoresis, fatigue  and fever.  HENT: Negative for mouth sores, sore throat and trouble swallowing.   Eyes: Negative for visual disturbance.  Respiratory: Negative for cough, chest tightness, shortness of breath and wheezing.   Cardiovascular: Negative for chest pain.  Gastrointestinal: Positive for abdominal pain, nausea and vomiting. Negative for abdominal distention and diarrhea.  Endocrine: Negative for polydipsia, polyphagia and polyuria.  Genitourinary: Positive for flank pain. Negative for dysuria, frequency and hematuria.  Musculoskeletal: Negative for gait problem.  Skin: Negative for color change, pallor and rash.  Neurological: Negative for dizziness, syncope, light-headedness and headaches.  Hematological: Does not bruise/bleed easily.  Psychiatric/Behavioral: Negative for behavioral problems and  confusion.     Physical Exam Updated Vital Signs BP 105/62 (BP Location: Left Arm)   Pulse 96   Temp 98.2 F (36.8 C)   Resp 18   Ht 5\' 1"  (1.549 m)   Wt 106 lb (48.1 kg)   LMP 10/04/2016 (Approximate)   SpO2 100%   BMI 20.03 kg/m   Physical Exam  Constitutional: She is oriented to person, place, and time. She appears well-developed and well-nourished. No distress.  HENT:  Head: Normocephalic.  Eyes: Conjunctivae are normal. Pupils are equal, round, and reactive to light. No scleral icterus.  Neck: Normal range of motion. Neck supple. No thyromegaly present.  Cardiovascular: Normal rate and regular rhythm.  Exam reveals no gallop and no friction rub.   No murmur heard. Pulmonary/Chest: Effort normal and breath sounds normal. No respiratory distress. She has no wheezes. She has no rales.  Abdominal: Soft. Bowel sounds are normal. She exhibits no distension. There is no tenderness. There is no rebound.  Tenderness and left upper abdomen. No guarding rebound or peritoneal irritation. No pain to percuss in the flank.  Musculoskeletal: Normal range of motion.  Neurological: She is alert and  oriented to person, place, and time.  Skin: Skin is warm and dry. No rash noted.  Psychiatric: She has a normal mood and affect. Her behavior is normal.     ED Treatments / Results  Labs (all labs ordered are listed, but only abnormal results are displayed) Labs Reviewed  CBC WITH DIFFERENTIAL/PLATELET - Abnormal; Notable for the following:       Result Value   Hemoglobin 11.5 (*)    HCT 35.2 (*)    All other components within normal limits  BASIC METABOLIC PANEL - Abnormal; Notable for the following:    CO2 21 (*)    Creatinine, Ser 1.37 (*)    GFR calc non Af Amer 52 (*)    All other components within normal limits  URINALYSIS, ROUTINE W REFLEX MICROSCOPIC - Abnormal; Notable for the following:    APPearance CLOUDY (*)    Hgb urine dipstick LARGE (*)    Protein, ur 100 (*)    Leukocytes, UA LARGE (*)    Bacteria, UA RARE (*)    Squamous Epithelial / LPF 0-5 (*)    All other components within normal limits    EKG  EKG Interpretation None       Radiology US Renal  Result Date: 11/12/2016 CLINICAL DATA:  Left flank pain for the past 10 days. Recent bilateral stent placement for hydronephrosis. EXAM: RENAL / URINARY TRACT ULTRASOUND COMPLETE COMPARISON:  Abdomen and pelvis CT dated 10/31/2016 and abdomen radiograph dated 11/04/2016. FINDINGS: Right Kidney: Length: 11.2 cm. Borderline echogenic. Mildly dilated collecting system. 1.2 cm lower pole calculus. Left Kidney: Length: 12.6 cm. Borderline echogenic. Mild to moderate hydronephrosis. 1.1 cm mid to lower cyst. Bladder: Ureteral stents.  Tiny calculus.  Anteflexed uterus. IMPRESSION: 1. Mild right hydronephrosis and mild to moderate left hydronephrosis. 2. 1.2 cm lower pole right renal calculus. This represents the renal pelvis calculus seen on the recent CT. 3. Tiny bladder calculus. 4. 1.1 cm left renal cyst. Electronically Signed   By: Claudie Revering M.D.   On: 11/12/2016 09:47    Procedures Procedures (including  critical care time)  Medications Ordered in ED Medications  morphine 4 MG/ML injection 4 mg (4 mg Intravenous Given 11/12/16 0817)  cephALEXin (KEFLEX) capsule 500 mg (not administered)  HYDROcodone-acetaminophen (NORCO/VICODIN) 5-325 MG per tablet 1 tablet (  not administered)  ondansetron (ZOFRAN) injection 4 mg (4 mg Intravenous Given 11/12/16 0817)  sodium chloride 0.9 % bolus 1,000 mL (0 mLs Intravenous Stopped 11/12/16 1040)  0.9 %  sodium chloride infusion ( Intravenous New Bag/Given 11/12/16 0818)     Initial Impression / Assessment and Plan / ED Course  I have reviewed the triage vital signs and the nursing notes.  Pertinent labs & imaging results that were available during my care of the patient were reviewed by me and considered in my medical decision making (see chart for details).  Clinical Course     Social mild hydronephrosis of her kidneys bilaterally but improved versus pre-stenting. Kidney function at baseline. Was recently on Keflex for pyuria but culture negative. Again has impressive pyuria with too many to count RBCs no PVCs. Culture again restarted. Restart Keflex. Vascular to recall her urologist on Tuesday for an update on culture and further guidance regarding continuing antibiotics. Prescription for pain medicine and antiemetics. Push fluids stay hydrated. ER with acute changes.  Final Clinical Impressions(s) / ED Diagnoses   Final diagnoses:  Abdominal pain, unspecified abdominal location  Nausea    New Prescriptions New Prescriptions   HYDROCODONE-ACETAMINOPHEN (NORCO/VICODIN) 5-325 MG TABLET    Take 1 tablet by mouth every 4 (four) hours as needed.   ONDANSETRON (ZOFRAN ODT) 4 MG DISINTEGRATING TABLET    Take 1 tablet (4 mg total) by mouth every 8 (eight) hours as needed for nausea.     Tanna Furry, MD 11/12/16 1500

## 2016-11-12 NOTE — ED Triage Notes (Signed)
Pt presents with c/o left flank pain. Pt reports she had stents placed in her kidneys on 12/6 and has been having pain since the stents were placed. Pt has been seen in the ER since then for the pain but reports that the pain is now out of control. Pt denies any vomiting but reports some nausea. Pt denies any hematuria.

## 2016-11-12 NOTE — Discharge Instructions (Signed)
Resume taking your Keflex.  Call Dr. Zettie Pho office on Wednesday to check culture result.  If no bacteria are growing, yo may be able to stop taking Keflex.

## 2016-11-23 ENCOUNTER — Encounter (HOSPITAL_BASED_OUTPATIENT_CLINIC_OR_DEPARTMENT_OTHER): Payer: Self-pay | Admitting: *Deleted

## 2016-11-24 ENCOUNTER — Encounter (HOSPITAL_BASED_OUTPATIENT_CLINIC_OR_DEPARTMENT_OTHER): Payer: Self-pay | Admitting: *Deleted

## 2016-11-24 NOTE — Progress Notes (Signed)
NPO AFTER MN W/ EXCEPTION CLEAR LIQUIDS UNTIL 0800 (NO CREAM /MILK PRODUCTS).  ARRIVE AT 8184.  NEEDS URINE PREG.  CURRENT LAB RESULTS IN CHART AND EPIC.  MAY TAKE OXYCODONE/ ZOFRAN AM DOS W/ SIPS OF WATER .

## 2016-12-01 ENCOUNTER — Encounter (HOSPITAL_BASED_OUTPATIENT_CLINIC_OR_DEPARTMENT_OTHER)
Admission: RE | Disposition: A | Discharge status: Home / Self Care | Payer: Self-pay | Source: Ambulatory Visit | Attending: Urology

## 2016-12-01 ENCOUNTER — Ambulatory Visit (HOSPITAL_BASED_OUTPATIENT_CLINIC_OR_DEPARTMENT_OTHER): Payer: Medicare Other | Admitting: Anesthesiology

## 2016-12-01 ENCOUNTER — Encounter (HOSPITAL_BASED_OUTPATIENT_CLINIC_OR_DEPARTMENT_OTHER): Payer: Self-pay | Admitting: Anesthesiology

## 2016-12-01 ENCOUNTER — Ambulatory Visit (HOSPITAL_BASED_OUTPATIENT_CLINIC_OR_DEPARTMENT_OTHER)
Admission: RE | Admit: 2016-12-01 | Discharge: 2016-12-01 | Disposition: A | Discharge status: Home / Self Care | Payer: Medicare Other | Source: Ambulatory Visit | Attending: Urology | Admitting: Urology

## 2016-12-01 DIAGNOSIS — A419 Sepsis, unspecified organism: Secondary | ICD-10-CM | POA: Diagnosis not present

## 2016-12-01 DIAGNOSIS — Z91048 Other nonmedicinal substance allergy status: Secondary | ICD-10-CM

## 2016-12-01 DIAGNOSIS — Z882 Allergy status to sulfonamides status: Secondary | ICD-10-CM | POA: Insufficient documentation

## 2016-12-01 DIAGNOSIS — Z885 Allergy status to narcotic agent status: Secondary | ICD-10-CM

## 2016-12-01 DIAGNOSIS — R109 Unspecified abdominal pain: Secondary | ICD-10-CM | POA: Diagnosis not present

## 2016-12-01 DIAGNOSIS — N132 Hydronephrosis with renal and ureteral calculous obstruction: Secondary | ICD-10-CM | POA: Insufficient documentation

## 2016-12-01 DIAGNOSIS — Z932 Ileostomy status: Secondary | ICD-10-CM

## 2016-12-01 DIAGNOSIS — Z87442 Personal history of urinary calculi: Secondary | ICD-10-CM | POA: Insufficient documentation

## 2016-12-01 DIAGNOSIS — Z881 Allergy status to other antibiotic agents status: Secondary | ICD-10-CM | POA: Insufficient documentation

## 2016-12-01 DIAGNOSIS — N189 Chronic kidney disease, unspecified: Secondary | ICD-10-CM | POA: Insufficient documentation

## 2016-12-01 DIAGNOSIS — Z9049 Acquired absence of other specified parts of digestive tract: Secondary | ICD-10-CM

## 2016-12-01 DIAGNOSIS — Z888 Allergy status to other drugs, medicaments and biological substances status: Secondary | ICD-10-CM

## 2016-12-01 DIAGNOSIS — Z85038 Personal history of other malignant neoplasm of large intestine: Secondary | ICD-10-CM | POA: Insufficient documentation

## 2016-12-01 DIAGNOSIS — N179 Acute kidney failure, unspecified: Secondary | ICD-10-CM | POA: Diagnosis not present

## 2016-12-01 DIAGNOSIS — R011 Cardiac murmur, unspecified: Secondary | ICD-10-CM | POA: Insufficient documentation

## 2016-12-01 HISTORY — DX: Personal history of other diseases of urinary system: Z87.448

## 2016-12-01 HISTORY — DX: Unspecified abdominal pain: R10.9

## 2016-12-01 HISTORY — DX: Personal history of urinary calculi: Z87.442

## 2016-12-01 HISTORY — DX: Cyst of kidney, acquired: N28.1

## 2016-12-01 HISTORY — DX: Flank pain, bilateral: R10.A3

## 2016-12-01 HISTORY — DX: Personal history of other complications of pregnancy, childbirth and the puerperium: Z87.59

## 2016-12-01 HISTORY — DX: Hypokalemia: E87.6

## 2016-12-01 HISTORY — DX: Ileostomy status: Z93.2

## 2016-12-01 HISTORY — PX: HOLMIUM LASER APPLICATION: SHX5852

## 2016-12-01 HISTORY — PX: CYSTOSCOPY WITH URETEROSCOPY: SHX5123

## 2016-12-01 HISTORY — DX: Urgency of urination: R39.15

## 2016-12-01 LAB — POCT PREGNANCY, URINE: Preg Test, Ur: NEGATIVE

## 2016-12-01 SURGERY — CYSTOSCOPY WITH URETEROSCOPY
Anesthesia: General | Site: Renal | Laterality: Bilateral

## 2016-12-01 MED ORDER — ONDANSETRON HCL 4 MG/2ML IJ SOLN
INTRAMUSCULAR | Status: DC | PRN
  Administered 2016-12-01: 4 mg via INTRAVENOUS

## 2016-12-01 MED ORDER — DEXTROSE 5 % IV SOLN
INTRAVENOUS | Status: AC
  Filled 2016-12-01: qty 50

## 2016-12-01 MED ORDER — PROPOFOL 10 MG/ML IV BOLUS
INTRAVENOUS | Status: AC
  Filled 2016-12-01: qty 20

## 2016-12-01 MED ORDER — METOCLOPRAMIDE HCL 5 MG/ML IJ SOLN
INTRAMUSCULAR | Status: AC
  Filled 2016-12-01: qty 2

## 2016-12-01 MED ORDER — DEXAMETHASONE SODIUM PHOSPHATE 10 MG/ML IJ SOLN
INTRAMUSCULAR | Status: AC
  Filled 2016-12-01: qty 1

## 2016-12-01 MED ORDER — SCOPOLAMINE 1 MG/3DAYS TD PT72
MEDICATED_PATCH | TRANSDERMAL | Status: AC
  Filled 2016-12-01: qty 1

## 2016-12-01 MED ORDER — HYDROCODONE-ACETAMINOPHEN 7.5-325 MG PO TABS
1.0000 | ORAL_TABLET | Freq: Once | ORAL | Status: DC | PRN
Start: 2016-12-01 — End: 2016-12-01
  Filled 2016-12-01: qty 1

## 2016-12-01 MED ORDER — MIDAZOLAM HCL 5 MG/5ML IJ SOLN
INTRAMUSCULAR | Status: DC | PRN
  Administered 2016-12-01: 2 mg via INTRAVENOUS

## 2016-12-01 MED ORDER — LIDOCAINE 2% (20 MG/ML) 5 ML SYRINGE
INTRAMUSCULAR | Status: AC
  Filled 2016-12-01: qty 5

## 2016-12-01 MED ORDER — HYDROMORPHONE HCL 1 MG/ML IJ SOLN
0.2500 mg | INTRAMUSCULAR | Status: DC | PRN
  Administered 2016-12-01: 0.25 mg via INTRAVENOUS
  Administered 2016-12-01 (×2): 0.5 mg via INTRAVENOUS
  Administered 2016-12-01: 0.25 mg via INTRAVENOUS
  Administered 2016-12-01: 0.5 mg via INTRAVENOUS
  Filled 2016-12-01: qty 0.5

## 2016-12-01 MED ORDER — PROPOFOL 10 MG/ML IV BOLUS
INTRAVENOUS | Status: DC | PRN
  Administered 2016-12-01: 150 mg via INTRAVENOUS
  Administered 2016-12-01: 50 mg via INTRAVENOUS

## 2016-12-01 MED ORDER — MEPERIDINE HCL 25 MG/ML IJ SOLN
6.2500 mg | INTRAMUSCULAR | Status: DC | PRN
  Filled 2016-12-01: qty 1

## 2016-12-01 MED ORDER — FENTANYL CITRATE (PF) 100 MCG/2ML IJ SOLN
INTRAMUSCULAR | Status: DC | PRN
  Administered 2016-12-01: 50 ug via INTRAVENOUS
  Administered 2016-12-01: 25 ug via INTRAVENOUS
  Administered 2016-12-01 (×2): 50 ug via INTRAVENOUS
  Administered 2016-12-01: 25 ug via INTRAVENOUS
  Administered 2016-12-01: 50 ug via INTRAVENOUS

## 2016-12-01 MED ORDER — IOHEXOL 300 MG/ML  SOLN
INTRAMUSCULAR | Status: DC | PRN
  Administered 2016-12-01: 20 mL via URETHRAL

## 2016-12-01 MED ORDER — FENTANYL CITRATE (PF) 100 MCG/2ML IJ SOLN
INTRAMUSCULAR | Status: AC
  Filled 2016-12-01: qty 2

## 2016-12-01 MED ORDER — DEXAMETHASONE SODIUM PHOSPHATE 4 MG/ML IJ SOLN
INTRAMUSCULAR | Status: DC | PRN
  Administered 2016-12-01: 10 mg via INTRAVENOUS

## 2016-12-01 MED ORDER — SCOPOLAMINE 1 MG/3DAYS TD PT72
1.0000 | MEDICATED_PATCH | TRANSDERMAL | Status: DC
  Administered 2016-12-01: 1 via TRANSDERMAL
  Filled 2016-12-01: qty 1

## 2016-12-01 MED ORDER — LACTATED RINGERS IV SOLN
INTRAVENOUS | Status: DC
  Administered 2016-12-01 (×2): via INTRAVENOUS
  Filled 2016-12-01: qty 1000

## 2016-12-01 MED ORDER — LIDOCAINE 2% (20 MG/ML) 5 ML SYRINGE
INTRAMUSCULAR | Status: DC | PRN
  Administered 2016-12-01: 50 mg via INTRAVENOUS

## 2016-12-01 MED ORDER — HYDROMORPHONE HCL 2 MG/ML IJ SOLN
INTRAMUSCULAR | Status: AC
  Filled 2016-12-01: qty 1

## 2016-12-01 MED ORDER — FENTANYL CITRATE (PF) 100 MCG/2ML IJ SOLN
INTRAMUSCULAR | Status: AC
Start: 2016-12-01 — End: 2016-12-01
  Filled 2016-12-01: qty 2

## 2016-12-01 MED ORDER — METOCLOPRAMIDE HCL 5 MG/ML IJ SOLN
10.0000 mg | Freq: Once | INTRAMUSCULAR | Status: DC | PRN
  Filled 2016-12-01: qty 2

## 2016-12-01 MED ORDER — DEXTROSE 5 % IV SOLN
1.0000 g | INTRAVENOUS | Status: AC
  Administered 2016-12-01: 1 g via INTRAVENOUS
  Filled 2016-12-01: qty 10

## 2016-12-01 MED ORDER — OXYCODONE-ACETAMINOPHEN 5-325 MG PO TABS
ORAL_TABLET | ORAL | Status: AC
  Filled 2016-12-01: qty 1

## 2016-12-01 MED ORDER — ONDANSETRON HCL 4 MG/2ML IJ SOLN
INTRAMUSCULAR | Status: AC
  Filled 2016-12-01: qty 2

## 2016-12-01 MED ORDER — MIDAZOLAM HCL 2 MG/2ML IJ SOLN
INTRAMUSCULAR | Status: AC
  Filled 2016-12-01: qty 2

## 2016-12-01 MED ORDER — METOCLOPRAMIDE HCL 5 MG/ML IJ SOLN
INTRAMUSCULAR | Status: DC | PRN
  Administered 2016-12-01: 5 mg via INTRAVENOUS

## 2016-12-01 MED ORDER — OXYCODONE-ACETAMINOPHEN 5-325 MG PO TABS: 1.0000 | ORAL_TABLET | ORAL | 0 refills | Status: DC | PRN

## 2016-12-01 MED ORDER — CEFTRIAXONE SODIUM 2 G IJ SOLR
INTRAMUSCULAR | Status: AC
  Filled 2016-12-01: qty 2

## 2016-12-01 MED ORDER — OXYCODONE-ACETAMINOPHEN 5-325 MG PO TABS
1.0000 | ORAL_TABLET | Freq: Once | ORAL | Status: AC
  Administered 2016-12-01: 1 via ORAL
  Filled 2016-12-01: qty 2

## 2016-12-01 MED ORDER — SODIUM CHLORIDE 0.9 % IR SOLN
Status: DC | PRN
  Administered 2016-12-01: 1000 mL via INTRAVESICAL
  Administered 2016-12-01: 3000 mL via INTRAVESICAL

## 2016-12-01 SURGICAL SUPPLY — 20 items
BAG DRAIN URO-CYSTO SKYTR STRL (DRAIN) ×2 IMPLANT
BAG DRN UROCATH (DRAIN) ×1
BASKET LASER NITINOL 1.9FR (BASKET) ×2 IMPLANT
BSKT STON RTRVL 120 1.9FR (BASKET) ×1
CATH INTERMIT  6FR 70CM (CATHETERS) ×2 IMPLANT
CLOTH BEACON ORANGE TIMEOUT ST (SAFETY) ×2 IMPLANT
FIBER LASER TRAC TIP (UROLOGICAL SUPPLIES) ×1 IMPLANT
GLOVE BIO SURGEON STRL SZ7.5 (GLOVE) ×2 IMPLANT
GOWN STRL REUS W/TWL XL LVL3 (GOWN DISPOSABLE) ×2 IMPLANT
GUIDEWIRE ANG ZIPWIRE 038X150 (WIRE) ×3 IMPLANT
GUIDEWIRE STR DUAL SENSOR (WIRE) ×2 IMPLANT
IV NS 1000ML (IV SOLUTION) ×2
IV NS 1000ML BAXH (IV SOLUTION) ×1 IMPLANT
IV NS IRRIG 3000ML ARTHROMATIC (IV SOLUTION) ×3 IMPLANT
KIT ROOM TURNOVER WOR (KITS) ×2 IMPLANT
MANIFOLD NEPTUNE II (INSTRUMENTS) ×1 IMPLANT
NS IRRIG 500ML POUR BTL (IV SOLUTION) ×2 IMPLANT
STENT POLARIS 5FRX22 (STENTS) ×2 IMPLANT
SYRINGE 10CC LL (SYRINGE) ×2 IMPLANT
TUBE FEEDING 8FR 16IN STR KANG (MISCELLANEOUS) ×2 IMPLANT

## 2016-12-01 NOTE — Anesthesia Postprocedure Evaluation (Signed)
Anesthesia Post Note  Patient: Mashawn E Aoun  Procedure(s) Performed: Procedure(s) (LRB): FIRST STAGE CYSTOSCOPY WITH URETEROSCOPY,  STONE MANIPULATION and BASKETRY, STENT EXCHANGE (Bilateral) HOLMIUM LASER APPLICATION (Bilateral)  Patient location during evaluation: PACU Anesthesia Type: General Level of consciousness: awake and alert Pain management: pain level controlled Vital Signs Assessment: post-procedure vital signs reviewed and stable Respiratory status: spontaneous breathing, nonlabored ventilation, respiratory function stable and patient connected to nasal cannula oxygen Cardiovascular status: blood pressure returned to baseline and stable Postop Assessment: no signs of nausea or vomiting Anesthetic complications: no       Last Vitals:  Vitals:   12/01/16 1745 12/01/16 1800  BP: 110/66 106/62  Pulse: 94 (!) 109  Resp: 12 20  Temp:      Last Pain:  Vitals:   12/01/16 1800  TempSrc:   PainSc: 10-Worst pain ever                 Montez Hageman

## 2016-12-01 NOTE — Anesthesia Procedure Notes (Signed)
Procedure Name: LMA Insertion Date/Time: 12/01/2016 4:09 PM Performed by: Duane Boston Pre-anesthesia Checklist: Patient identified, Emergency Drugs available, Suction available and Patient being monitored Patient Re-evaluated:Patient Re-evaluated prior to inductionOxygen Delivery Method: Circle system utilized Preoxygenation: Pre-oxygenation with 100% oxygen Intubation Type: IV induction Ventilation: Mask ventilation without difficulty LMA: LMA inserted LMA Size: 4.0 Number of attempts: 1 Airway Equipment and Method: Bite block Placement Confirmation: positive ETCO2 Tube secured with: Tape Dental Injury: Teeth and Oropharynx as per pre-operative assessment

## 2016-12-01 NOTE — Anesthesia Preprocedure Evaluation (Signed)
Anesthesia Evaluation  Patient identified by MRN, date of birth, ID band Patient awake    Reviewed: Allergy & Precautions, NPO status , Patient's Chart, lab work & pertinent test results  History of Anesthesia Complications (+) history of anesthetic complications  Airway Mallampati: II  TM Distance: >3 FB Neck ROM: Full    Dental  (+) Teeth Intact   Pulmonary neg pulmonary ROS, pneumonia,  Hx/o aspiration pneumonia S/P appendectomy   Pulmonary exam normal breath sounds clear to auscultation       Cardiovascular negative cardio ROS Normal cardiovascular exam+ Valvular Problems/Murmurs MR  Rhythm:Regular Rate:Normal     Neuro/Psych negative neurological ROS  negative psych ROS   GI/Hepatic Neg liver ROS, Hx/o Colon Ca- S/P subtotal colectomy, Proctectomy and ileostomy Hx/o familial colon polyposis   Endo/Other  negative endocrine ROS  Renal/GU Renal diseaseBilateral renal calculi Hx/o ARF 2015, 2016  negative genitourinary   Musculoskeletal negative musculoskeletal ROS (+)   Abdominal   Peds  Hematology  (+) anemia ,   Anesthesia Other Findings   Reproductive/Obstetrics                             Anesthesia Physical Anesthesia Plan  ASA: III  Anesthesia Plan: General   Post-op Pain Management:    Induction: Intravenous  Airway Management Planned: LMA  Additional Equipment:   Intra-op Plan:   Post-operative Plan: Extubation in OR  Informed Consent: I have reviewed the patients History and Physical, chart, labs and discussed the procedure including the risks, benefits and alternatives for the proposed anesthesia with the patient or authorized representative who has indicated his/her understanding and acceptance.   Dental advisory given  Plan Discussed with: CRNA, Anesthesiologist and Surgeon  Anesthesia Plan Comments:         Anesthesia Quick Evaluation

## 2016-12-01 NOTE — Brief Op Note (Signed)
12/01/2016  5:24 PM  PATIENT:  Cindy [name]  26 y.o. female  PRE-OPERATIVE DIAGNOSIS:  BILATERAL RENAL STONES  POST-OPERATIVE DIAGNOSIS:  BILATERAL RENAL STONES  PROCEDURE:  Procedure(s): FIRST STAGE CYSTOSCOPY WITH URETEROSCOPY,  STONE MANIPULATION and BASKETRY (Bilateral) HOLMIUM LASER APPLICATION (Bilateral)  SURGEON:  Surgeon(s) and Role:    * Alexis Frock, MD - Primary  PHYSICIAN ASSISTANT:   ASSISTANTS: none   ANESTHESIA:   general  EBL:  Total I/O In: 1500 [I.V.:1500] Out: -   BLOOD ADMINISTERED:none  DRAINS: none   LOCAL MEDICATIONS USED:  NONE  SPECIMEN:  Source of Specimen:  bilateral renal stone fragments  DISPOSITION OF SPECIMEN:  [facility] for compositional analysis  COUNTS:  YES  TOURNIQUET:  * No tourniquets in log *  DICTATION: .Other Dictation: Dictation Number [id number]  PLAN OF CARE: Discharge to home after PACU  PATIENT DISPOSITION:  PACU - hemodynamically stable.   Delay start of Pharmacological VTE agent (>24hrs) due to surgical blood loss or risk of bleeding: yes

## 2016-12-01 NOTE — Discharge Instructions (Signed)
[facility] °[phone] °Post Ureteroscopy With or Without Stent Instructions ° °Definitions: ° °Ureter: The duct that transports urine from the kidney to the bladder. °Stent:   A plastic hollow tube that is placed into the ureter, from the kidney to the                 bladder to prevent the ureter from swelling shut. ° °GENERAL INSTRUCTIONS: ° °Despite the fact that no skin incisions were used, the area around the ureter and bladder is raw and irritated. The stent is a foreign body which will further irritate the bladder wall. This irritation is manifested by increased frequency of urination, both day and night, and by an increase in the urge to urinate. In some, the urge to urinate is present almost always. Sometimes the urge is strong enough that you may not be able to stop yourself from urinating. The only real cure is to remove the stent and then give time for the bladder wall to heal which can't be done until the danger of the ureter swelling shut has passed, which varies. ° °You may see some blood in your urine while the stent is in place and a few days afterwards. Do not be alarmed, even if the urine was clear for a while. Get off your feet and drink lots of fluids until clearing occurs. If you start to pass clots or don't improve, call us. ° °DIET: °You may return to your normal diet immediately. Because of the raw surface of your bladder, alcohol, spicy foods, acid type foods and drinks with caffeine may cause irritation or frequency and should be used in moderation. To keep your urine flowing freely and to avoid constipation, drink plenty of fluids during the day ( 8-10 glasses ). °Tip: Avoid cranberry juice because it is very acidic. ° °ACTIVITY: °Your physical activity doesn't need to be restricted. However, if you are very active, you may see some blood in your urine. We suggest that you reduce your activity under these circumstances until the bleeding has stopped. ° °BOWELS: °It is  important to keep your bowels regular during the postoperative period. Straining with bowel movements can cause bleeding. A bowel movement every other day is reasonable. Use a mild laxative if needed, such as Milk of Magnesia 2-3 tablespoons, or 2 Dulcolax tablets. Call if you continue to have problems. If you have been taking narcotics for pain, before, during or after your surgery, you may be constipated. Take a laxative if necessary. ° ° °MEDICATION: °You should resume your pre-surgery medications unless told not to. In addition you will often be given an antibiotic to prevent infection. These should be taken as prescribed until the bottles are finished unless you are having an unusual reaction to one of the drugs. ° °PROBLEMS YOU SHOULD REPORT TO US: °· Fevers over 100.5 Fahrenheit. °· Heavy bleeding, or clots ( See above notes about blood in urine ). °· Inability to urinate. °· Drug reactions ( hives, rash, nausea, vomiting, diarrhea ). °· Severe burning or pain with urination that is not improving. ° °FOLLOW-UP: °You will need a follow-up appointment to monitor your progress. Call for this appointment at the number listed above. Usually the first appointment will be about three to fourteen days after your surgery. ° ° ° ° ° °Post Anesthesia Home Care Instructions ° °Activity: °Get plenty of rest for the remainder of the day. A responsible adult should stay with you for 24 hours following the procedure.  °  any medication unless instructed by your physician -Make any legal decisions or sign important papers.  Meals: Start with liquid foods such as gelatin or soup. Progress to regular foods as tolerated. Avoid greasy, spicy, heavy foods. If nausea and/or vomiting occur, drink only clear liquids until the nausea and/or vomiting subsides. Call your physician if vomiting continues.  Special Instructions/Symptoms: Your throat  may feel dry or sore from the anesthesia or the breathing tube placed in your throat during surgery. If this causes discomfort, gargle with warm salt water. The discomfort should disappear within 24 hours.  If you had a scopolamine patch placed behind your ear for the management of post- operative nausea and/or vomiting:  1. The medication in the patch is effective for 72 hours, after which it should be removed.  Wrap patch in a tissue and discard in the trash. Wash hands thoroughly with soap and water. 2. You may remove the patch earlier than 72 hours if you experience unpleasant side effects which may include dry mouth, dizziness or visual disturbances. 3. Avoid touching the patch. Wash your hands with soap and water after contact with the patch.   1 - You may have urinary urgency (bladder spasms) and bloody urine on / off with stent in place. This is normal.  2 - Call MD or go to ER for fever >102, severe pain / nausea / vomiting not relieved by medications, or acute change in medical status  

## 2016-12-01 NOTE — H&P (Signed)
Cindy [name] is an 26 y.o. female.    Chief Complaint: Pre-op 1st stage BILATERAL Ureteroscopic Stone Manipulation  HPI:   1 - Recurrent Nephrolithiasis -long h/o recurrent nephrolithiasis with medical stone passage x many. She states this predates her iliostomy but has worsned since. She may desire future elective pregnancy.  10/2016 - CT - Rt upper pole 69mm stone with focal upper pole hydro, scattered Lt punctate renal stones --> bilateral JJ stents placed 11/01/16, plan for staged bilateral ureteroscopy in elective setting.   2 - Chronic Mild Hydronephrosis -long h/o Rt>Lt mild hydro on imaging since at least 2009 even at times when stones not present. No prior renography. Baselien Cr 1.3's.  3- Medical Stone Disease -s/p iliosotomy for which she has to take daily imodium. Eval 2017: BMP,PTH,Urate - pending; Composition - pending; 24 Hr Urines - pending  PMH sig for FAP/total colectomy with end iliostomy (follows Lenise [facility] at [facility]), Leg reconstruction after injury.   Today "Cindy [name]" is seen to proced with 1st atage bilaterl ureteroscopy for goal of stone free. Most recetn UCX 12/27 from our office ngative. She has irritative sympotms form stents as expected.   Past Medical History:  Diagnosis Date  . Bilateral flank pain    due to ureteral stents  . Chronic hypokalemia   . 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 Complication of anesthesia    post op aspiration pneumonia w/ lap. appy 12-23-2007 (emergent ruptured appendix)  . FAP (familial adenomatous polyposis) 09/30/2014  . Heart murmur   . History of acute renal failure    2015;  2016  . History of fetal demise, not currently pregnant    03-22-2013  stillborn at 71 wks  . History of kidney stones   . Ileostomy in  place [facility])   . Iron deficiency anemia   . Nephrolithiasis    BILATERAL   . Renal cyst, left    PER CT 11-12-2016  . Urgency of urination   . UTI (lower urinary tract infection)     Past Surgical History:  Procedure Laterality Date  . COLONOSCOPY N/A 05/24/2013   Procedure: COLONOSCOPY;  Surgeon: Missy Sabins, MD;  Location: [facility];  Service: Endoscopy;  Laterality: N/A;  . COMPLETION PROTECTOMY AND REVISION ILEOSTOMY/ LYSIS ADHESIONS  07-28-2014    [facility]  . CYSTOSCOPY W/ URETERAL STENT PLACEMENT Bilateral 11/01/2016   Procedure: CYSTOSCOPY WITH BILATERAL RETROGRADE PYELOGRAM BILATERAL URETERAL STENT PLACEMENT;  Surgeon: Alexis Frock, MD;  Location: [facility];  Service: Urology;  Laterality: Bilateral;  . I&D EXTREMITY Left 11/14/2013   Procedure: IRRIGATION AND DEBRIDEMENT LEFT LONG FINGER;  Surgeon: Tennis Must, MD;  Location: [place];  Service: Orthopedics;  Laterality: Left;  . IM NAILING TIBIA Left 08/23/2010  . LAPAROSCOPIC APPENDECTOMY  12/23/2007  . LEG RECONSTRUCTION USING FASCIAL FLAP  ~ 2009  . SUBTOTAL COLECTOMY /  RESECTION ENBLOC DISTAL ILEUM/  ILEOSTOMY  07/ 2014  [facility]  . TRANSTHORACIC ECHOCARDIOGRAM  11/11/2011   ef 55-60%/  mild MR/  trivial PR and TR/  PASP 29-16mmHg/  trivial pericardial effusion identified    Family History  Problem Relation Age of Onset  . Adopted: Yes  . ALS Mother   . Alcohol abuse Neg Hx   . Arthritis Neg Hx   .  Asthma Neg Hx   . Birth defects Neg Hx   . Cancer Neg Hx   . COPD Neg Hx   . Depression Neg Hx   . Diabetes Neg Hx   . Drug abuse Neg Hx   . Early death Neg Hx   . Hearing loss Neg Hx   . Heart disease Neg Hx   . Hyperlipidemia Neg Hx   . Hypertension Neg Hx   . Kidney disease Neg Hx   . Learning disabilities Neg Hx   . Mental illness Neg Hx   . Mental retardation Neg Hx   . Miscarriages / Stillbirths Neg Hx   . Stroke Neg Hx   . Vision loss Neg Hx    Social History:  reports that she has never  smoked. She has never used smokeless tobacco. She reports that she does not drink alcohol or use drugs.  Allergies:  Allergies  Allergen Reactions  . Venofer [Ferric Oxide] Anaphylaxis  . Soap Hives, Itching and Other (See Comments)     allergic to [employer].    . Food Hives and Other (See Comments)     allergic to orange juice.   . Morphine And Related Swelling    When given IV, swelling around site  . Sulfamethoxazole-Trimethoprim Nausea And Vomiting    Patient developed AKI that appears to have been from bactrim. She might be able to take again if watched closely and it is absolutely needed - ie there was no anaphylaxis or allergic rash  . Iron Palpitations  . Levaquin [Levofloxacin] Hives    No prescriptions prior to admission.    No results found for this or any previous visit (from the past 48 hour(s)). No results found.  Review of Systems  Constitutional: Negative.  Negative for chills and fever.  HENT: Negative.   Eyes: Negative.   Respiratory: Negative.   Cardiovascular: Negative.   Gastrointestinal: Negative.   Genitourinary: Positive for frequency and urgency.  Musculoskeletal: Negative.   Skin: Negative.   Neurological: Negative.   Endo/Heme/Allergies: Negative.   Psychiatric/Behavioral: Negative.     Height 5\' 1"  (1.549 m), weight 49 kg (108 lb), last menstrual period 11/16/2016. Physical Exam  Constitutional: She appears well-developed.  Very thin.   HENT:  Head: Normocephalic.  Eyes: Pupils are equal, round, and reactive to light.  Cardiovascular: Normal rate.   Respiratory: Effort normal.  GI:  End iliostomy patent of stool and gas.   Genitourinary:  Genitourinary Comments: NO CVAT at present.   Musculoskeletal: Normal range of motion.  Neurological: She is alert.  Psychiatric:  Very labile mood as per baseline.      Assessment/Plan  Proceed as planned with 1st stage bilateral ureteroscopy. Overall goal wtill be to get stone free, then  complete metabolic eval, and medical managmenet.   Alexis Frock, MD 12/01/2016, 7:01 AM

## 2016-12-01 NOTE — Transfer of Care (Addendum)
  Last Vitals:  Vitals:   12/01/16 1257 12/01/16 1737  BP: 105/62 114/70  Pulse: 100 90  Resp: 14 18  Temp: 36.8 C 36.4 C    Last Pain:  Vitals:   12/01/16 1306  TempSrc:   PainSc: 8       Patients Stated Pain Goal: 4 (12/01/16 1306) Immediate Anesthesia Transfer of Care Note  Patient: Cindy [name]  Procedure(s) Performed: Procedure(s) (LRB): FIRST STAGE CYSTOSCOPY WITH URETEROSCOPY,  STONE MANIPULATION and BASKETRY, STENT EXCHANGE (Bilateral) HOLMIUM LASER APPLICATION (Bilateral)  Patient Location: PACU  Anesthesia Type: General  Level of Consciousness: awake, alert  and oriented  Airway & Oxygen Therapy: Patient Spontanous Breathing and Patient connected to nasal cannula  oxygen  Post-op Assessment: Report given to PACU RN and Post -op Vital signs reviewed and stable  Post vital signs: Reviewed and stable  Complications: No apparent anesthesia complications

## 2016-12-03 ENCOUNTER — Emergency Department (HOSPITAL_COMMUNITY): Payer: Medicare Other

## 2016-12-03 ENCOUNTER — Encounter (HOSPITAL_COMMUNITY): Payer: Self-pay

## 2016-12-03 ENCOUNTER — Inpatient Hospital Stay (HOSPITAL_COMMUNITY)
Admission: EM | Admit: 2016-12-03 | Discharge: 2016-12-06 | DRG: 854 | Disposition: A | Discharge status: Home / Self Care | Payer: Medicare Other | Attending: Family Medicine | Admitting: Family Medicine

## 2016-12-03 DIAGNOSIS — N179 Acute kidney failure, unspecified: Secondary | ICD-10-CM | POA: Diagnosis present

## 2016-12-03 DIAGNOSIS — B952 Enterococcus as the cause of diseases classified elsewhere: Secondary | ICD-10-CM | POA: Diagnosis present

## 2016-12-03 DIAGNOSIS — Z85038 Personal history of other malignant neoplasm of large intestine: Secondary | ICD-10-CM | POA: Diagnosis not present

## 2016-12-03 DIAGNOSIS — D649 Anemia, unspecified: Secondary | ICD-10-CM | POA: Diagnosis present

## 2016-12-03 DIAGNOSIS — R112 Nausea with vomiting, unspecified: Secondary | ICD-10-CM | POA: Diagnosis not present

## 2016-12-03 DIAGNOSIS — Z91048 Other nonmedicinal substance allergy status: Secondary | ICD-10-CM | POA: Diagnosis not present

## 2016-12-03 DIAGNOSIS — R1900 Intra-abdominal and pelvic swelling, mass and lump, unspecified site: Secondary | ICD-10-CM

## 2016-12-03 DIAGNOSIS — Z888 Allergy status to other drugs, medicaments and biological substances status: Secondary | ICD-10-CM

## 2016-12-03 DIAGNOSIS — N390 Urinary tract infection, site not specified: Secondary | ICD-10-CM | POA: Diagnosis present

## 2016-12-03 DIAGNOSIS — N182 Chronic kidney disease, stage 2 (mild): Secondary | ICD-10-CM | POA: Diagnosis present

## 2016-12-03 DIAGNOSIS — N132 Hydronephrosis with renal and ureteral calculous obstruction: Secondary | ICD-10-CM | POA: Diagnosis present

## 2016-12-03 DIAGNOSIS — Z882 Allergy status to sulfonamides status: Secondary | ICD-10-CM | POA: Diagnosis not present

## 2016-12-03 DIAGNOSIS — Z881 Allergy status to other antibiotic agents status: Secondary | ICD-10-CM | POA: Diagnosis not present

## 2016-12-03 DIAGNOSIS — Z932 Ileostomy status: Secondary | ICD-10-CM

## 2016-12-03 DIAGNOSIS — N184 Chronic kidney disease, stage 4 (severe): Secondary | ICD-10-CM | POA: Diagnosis present

## 2016-12-03 DIAGNOSIS — Z9049 Acquired absence of other specified parts of digestive tract: Secondary | ICD-10-CM | POA: Diagnosis not present

## 2016-12-03 DIAGNOSIS — A419 Sepsis, unspecified organism: Principal | ICD-10-CM | POA: Diagnosis present

## 2016-12-03 DIAGNOSIS — D1391 Familial adenomatous polyposis: Secondary | ICD-10-CM | POA: Diagnosis present

## 2016-12-03 DIAGNOSIS — Z885 Allergy status to narcotic agent status: Secondary | ICD-10-CM

## 2016-12-03 DIAGNOSIS — D126 Benign neoplasm of colon, unspecified: Secondary | ICD-10-CM | POA: Diagnosis present

## 2016-12-03 DIAGNOSIS — R109 Unspecified abdominal pain: Secondary | ICD-10-CM | POA: Diagnosis present

## 2016-12-03 DIAGNOSIS — E876 Hypokalemia: Secondary | ICD-10-CM | POA: Diagnosis present

## 2016-12-03 DIAGNOSIS — E860 Dehydration: Secondary | ICD-10-CM | POA: Diagnosis present

## 2016-12-03 DIAGNOSIS — N200 Calculus of kidney: Secondary | ICD-10-CM

## 2016-12-03 DIAGNOSIS — N9489 Other specified conditions associated with female genital organs and menstrual cycle: Secondary | ICD-10-CM

## 2016-12-03 LAB — I-STAT BETA HCG BLOOD, ED (MC, WL, AP ONLY): I-stat hCG, quantitative: <5 m[IU]/mL (ref <5)

## 2016-12-03 LAB — CBC
HEMATOCRIT: 32.1 % — AB (ref 36.0–46.0)
HEMOGLOBIN: 10 g/dL — AB (ref 12.0–15.0)
MCH: 26.3 pg (ref 26.0–34.0)
MCHC: 31.2 g/dL (ref 30.0–36.0)
MCV: 84.5 fL (ref 78.0–100.0)
Platelets: 282 10*3/uL (ref 150–400)
RBC: 3.8 MIL/uL — ABNORMAL LOW (ref 3.87–5.11)
RDW: 13 % (ref 11.5–15.5)
WBC: 8.2 10*3/uL (ref 4.0–10.5)

## 2016-12-03 LAB — URINALYSIS, ROUTINE W REFLEX MICROSCOPIC
Bilirubin Urine: NEGATIVE
Glucose, UA: NEGATIVE mg/dL
Ketones, ur: NEGATIVE mg/dL
NITRITE: NEGATIVE
PH: 6 (ref 5.0–8.0)
Protein, ur: 100 mg/dL — AB
SPECIFIC GRAVITY, URINE: 1.015 (ref 1.005–1.030)

## 2016-12-03 LAB — COMPREHENSIVE METABOLIC PANEL
ALK PHOS: 59 U/L (ref 38–126)
ALT: 11 U/L — AB (ref 14–54)
AST: 16 U/L (ref 15–41)
Albumin: 3.6 g/dL (ref 3.5–5.0)
Anion gap: 9 (ref 5–15)
BILIRUBIN TOTAL: 0.8 mg/dL (ref 0.3–1.2)
BUN: 8 mg/dL (ref 6–20)
CO2: 23 mmol/L (ref 22–32)
CREATININE: 1.27 mg/dL — AB (ref 0.44–1.00)
Calcium: 8.9 mg/dL (ref 8.9–10.3)
Chloride: 110 mmol/L (ref 101–111)
GFR calc Af Amer: >60 mL/min (ref >60)
GFR, EST NON AFRICAN AMERICAN: 57 mL/min — AB (ref >60)
Glucose, Bld: 97 mg/dL (ref 65–99)
Potassium: 3 mmol/L — ABNORMAL LOW (ref 3.5–5.1)
Sodium: 142 mmol/L (ref 135–145)
TOTAL PROTEIN: 7 g/dL (ref 6.5–8.1)

## 2016-12-03 LAB — URINALYSIS, MICROSCOPIC (REFLEX)

## 2016-12-03 LAB — I-STAT CG4 LACTIC ACID, ED: LACTIC ACID, VENOUS: 2.36 mmol/L — AB (ref 0.5–1.9)

## 2016-12-03 LAB — MAGNESIUM: Magnesium: 1.3 mg/dL — ABNORMAL LOW (ref 1.7–2.4)

## 2016-12-03 LAB — LIPASE, BLOOD: Lipase: 22 U/L (ref 11–51)

## 2016-12-03 LAB — LACTIC ACID, PLASMA: LACTIC ACID, VENOUS: 1.9 mmol/L (ref 0.5–1.9)

## 2016-12-03 MED ORDER — POTASSIUM CHLORIDE IN NACL 20-0.9 MEQ/L-% IV SOLN
INTRAVENOUS | Status: DC
  Administered 2016-12-03: 22:00:00 via INTRAVENOUS
  Administered 2016-12-04: 125 mL via INTRAVENOUS
  Administered 2016-12-05 (×2): via INTRAVENOUS
  Filled 2016-12-03 (×7): qty 1000

## 2016-12-03 MED ORDER — HYDROMORPHONE HCL 1 MG/ML IJ SOLN
1.0000 mg | Freq: Once | INTRAMUSCULAR | Status: AC
  Administered 2016-12-03: 1 mg via INTRAVENOUS
  Filled 2016-12-03: qty 1

## 2016-12-03 MED ORDER — ONDANSETRON HCL 4 MG PO TABS
4.0000 mg | ORAL_TABLET | Freq: Four times a day (QID) | ORAL | Status: DC | PRN
  Administered 2016-12-05: 4 mg via ORAL

## 2016-12-03 MED ORDER — DEXTROSE 5 % IV SOLN
1.0000 g | Freq: Every day | INTRAVENOUS | Status: DC
  Administered 2016-12-03 – 2016-12-05 (×3): 1 g via INTRAVENOUS
  Filled 2016-12-03 (×4): qty 10

## 2016-12-03 MED ORDER — SODIUM CHLORIDE 0.9 % IV BOLUS (SEPSIS)
1000.0000 mL | Freq: Once | INTRAVENOUS | Status: AC
  Administered 2016-12-03: 1000 mL via INTRAVENOUS

## 2016-12-03 MED ORDER — ONDANSETRON HCL 4 MG/2ML IJ SOLN
4.0000 mg | Freq: Four times a day (QID) | INTRAMUSCULAR | Status: DC | PRN
  Administered 2016-12-04 – 2016-12-05 (×3): 4 mg via INTRAVENOUS
  Filled 2016-12-03 (×5): qty 2

## 2016-12-03 MED ORDER — DEXTROSE 5 % IV SOLN
1.0000 g | Freq: Once | INTRAVENOUS | Status: AC
  Administered 2016-12-03: 1 g via INTRAVENOUS
  Filled 2016-12-03: qty 1

## 2016-12-03 MED ORDER — POTASSIUM CHLORIDE CRYS ER 20 MEQ PO TBCR
40.0000 meq | EXTENDED_RELEASE_TABLET | Freq: Once | ORAL | Status: AC
Start: 2016-12-03 — End: 2016-12-03
  Administered 2016-12-03: 40 meq via ORAL
  Filled 2016-12-03: qty 2

## 2016-12-03 MED ORDER — ONDANSETRON HCL 4 MG/2ML IJ SOLN
4.0000 mg | Freq: Once | INTRAMUSCULAR | Status: AC
  Administered 2016-12-03: 4 mg via INTRAVENOUS
  Filled 2016-12-03: qty 2

## 2016-12-03 MED ORDER — OXYCODONE HCL 5 MG PO TABS
5.0000 mg | ORAL_TABLET | ORAL | Status: DC | PRN
  Administered 2016-12-03 – 2016-12-06 (×6): 10 mg via ORAL
  Filled 2016-12-03 (×7): qty 2

## 2016-12-03 MED ORDER — ACETAMINOPHEN 500 MG PO TABS
1000.0000 mg | ORAL_TABLET | Freq: Three times a day (TID) | ORAL | Status: DC
  Administered 2016-12-03 – 2016-12-05 (×6): 1000 mg via ORAL
  Filled 2016-12-03 (×6): qty 2

## 2016-12-03 MED ORDER — SODIUM CHLORIDE 0.9 % IV BOLUS (SEPSIS)
500.0000 mL | Freq: Once | INTRAVENOUS | Status: AC
  Administered 2016-12-03: 500 mL via INTRAVENOUS

## 2016-12-03 NOTE — ED Triage Notes (Signed)
Pt here with abdominal/flank pain.  Pt was seen 2 days ago and had kidney stone removed.  Pt states vomiting since yesterday. Pain.  Urine without blood.

## 2016-12-03 NOTE — ED Notes (Signed)
Patient has been stuck multiple times for second cultures. This tech can not attempt anymore, at my max.

## 2016-12-03 NOTE — H&P (Signed)
History and Physical  Patient Name: Cindy [name]     HWE:[id number]    DOB: 1991/05/02    DOA: 12/03/2016 PCP: Pcp Not In System   Patient coming from: Home  Chief Complaint: Vomiting and flank pain  HPI: VEDHA TERCERO is a 26 y.o. 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  The patient had a 10 mm right obstructing stone at the beginning of December, had bilateral ureteral stents placed. On 12/6 here at [facility], required several rounds of Keflex/Bactrim in the interim. 3 days ago, Dr. Tresa Moore performed with stone manipulation, left both stents in place, she was discharged the same day, not on antibiotics.  She had bilateral flank pain and bilateral groin pain, both at rest and with voiding since then.  No hematuria, foul-urine, cloudy urine.  Today, had chills and vomited 4 times, and so came to the ER.  No cough, congestion, dyspnea, coryza.    ED course: -Temp 99.60F, heart rate 149, respirations and pulse ox normal, BP 116/75 -Na 142, K 3.0, Cr 1.27 (baseline 1.2), WBC 8.2K, Hgb 10, lipase normal -Serum Hcg negative -Lactic acid 2.36 -CT of the abdomen and pelvis showed bilateral stents in place, hydronephrosis resolved, right ureteral stone from 10 mm now 4-5 mm, no stones in the left, stranding around the left kidney and ureters, and a new pelvic mass (not described as abscess) -She was given IVF and Azactam and TRH were asked to evaluate.    The patient's last positive urine in our system was 2016.  Last in the [facility] system was 10/27/16 and was klebsiella.   ROS: Review of Systems  Constitutional: Positive for chills. Negative for fever.  Gastrointestinal: Positive for abdominal pain, nausea and vomiting.  Genitourinary: Positive for dysuria, flank pain and frequency.  Musculoskeletal:  Positive for back pain.  All other systems reviewed and are negative.         Past Medical History:  Diagnosis Date  . Bilateral flank pain    due to ureteral stents  . Chronic hypokalemia   . 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 Complication of anesthesia    post op aspiration pneumonia w/ lap. appy 12-23-2007 (emergent ruptured appendix)  . FAP (familial adenomatous polyposis) 09/30/2014  . Heart murmur   . History of acute renal failure    2015;  2016  . History of fetal demise, not currently pregnant    03-22-2013  stillborn at 59 wks  . History of kidney stones   . Ileostomy in place [facility])   . Iron deficiency anemia   . Nephrolithiasis    BILATERAL   . Renal cyst, left    PER CT 11-12-2016  . Urgency of urination   . UTI (lower urinary tract infection)     Past Surgical History:  Procedure Laterality Date  . COLONOSCOPY N/A 05/24/2013   Procedure: COLONOSCOPY;  Surgeon: Missy Sabins, MD;  Location: [place];  Service: Endoscopy;  Laterality: N/A;  . COMPLETION PROTECTOMY AND REVISION ILEOSTOMY/ LYSIS ADHESIONS  07-28-2014    [facility]  . CYSTOSCOPY W/ URETERAL STENT PLACEMENT Bilateral 11/01/2016   Procedure: CYSTOSCOPY WITH  BILATERAL RETROGRADE PYELOGRAM BILATERAL URETERAL STENT PLACEMENT;  Surgeon: Alexis Frock, MD;  Location: [facility];  Service: Urology;  Laterality: Bilateral;  . I&D EXTREMITY Left 11/14/2013   Procedure: IRRIGATION AND DEBRIDEMENT LEFT LONG FINGER;  Surgeon: Tennis Must, MD;  Location: [place];  Service: Orthopedics;  Laterality: Left;  . IM NAILING TIBIA Left 08/23/2010  . LAPAROSCOPIC APPENDECTOMY  12/23/2007  . LEG RECONSTRUCTION USING FASCIAL FLAP  ~ 2009  . SUBTOTAL COLECTOMY /  RESECTION ENBLOC DISTAL ILEUM/  ILEOSTOMY  07/ 2014   [facility]  . TRANSTHORACIC ECHOCARDIOGRAM  11/11/2011   ef 55-60%/  mild MR/  trivial PR and TR/  PASP 29-78mmHg/  trivial pericardial effusion identified    Social History: Patient lives with her boyfriend.  The patient walks unassisted.  She is a CMA.  She does not smoke.    Allergies  Allergen Reactions  . Venofer [Ferric Oxide] Anaphylaxis  . Soap Hives, Itching and Other (See Comments)     allergic to [employer].    . Food Hives and Other (See Comments)     allergic to orange juice.   . Morphine And Related Swelling    When given IV, swelling around site  . Sulfamethoxazole-Trimethoprim Nausea And Vomiting    Patient developed AKI that appears to have been from bactrim. She might be able to take again if watched closely and it is absolutely needed - ie there was no anaphylaxis or allergic rash  . Iron Palpitations  . Levaquin [Levofloxacin] Hives    Family history: family history includes ALS in her mother. She was adopted.  Prior to Admission medications   Medication Sig Start Date End Date Taking? Authorizing Provider  citalopram (CELEXA) 10 MG tablet Take 10 mg by mouth at bedtime.   Yes Historical Provider, MD  cyanocobalamin (,VITAMIN B-12,) 1000 MCG/ML injection Inject 1,000 mcg into the muscle every 30 (thirty) days.   Yes Historical Provider, MD  LORazepam (ATIVAN) 1 MG tablet Take 1 mg by mouth 3 (three) times daily as needed for anxiety.   Yes Historical Provider, MD  magnesium oxide (MAG-OX) 400 (241.3 Mg) MG tablet Take 400 mg by mouth daily.   Yes Historical Provider, MD  Multiple Vitamins-Minerals (MULTIVITAMIN GUMMIES ADULT) CHEW Chew 1 each by mouth daily.    Yes Historical Provider, MD  ondansetron (ZOFRAN ODT) 4 MG disintegrating tablet Take 1 tablet (4 mg total) by mouth every 8 (eight) hours as needed for nausea. 11/12/16  Yes Tanna Furry, MD  oxyCODONE-acetaminophen (PERCOCET/ROXICET) 5-325 MG tablet Take 1-2 tablets by mouth every 4 (four) hours as needed for  severe pain. Post-operatively / from kidney stones. 12/01/16  Yes Alexis Frock, MD  scopolamine (TRANSDERM-SCOP) 1 MG/3DAYS Place 1 patch onto the skin every 3 (three) days. 12/01/16 12/04/16 Yes Historical Provider, MD  Vitamin D, Ergocalciferol, (DRISDOL) 50000 units CAPS capsule Take 50,000 Units by mouth every 7 (seven) days. ON [facility] 05/22/16  Yes Historical Provider, MD  potassium chloride 20 MEQ/15ML (10%) SOLN Take 20 mLs by mouth 4 (four) times daily.     Historical Provider, MD       Physical Exam: BP 111/65 (BP Location: Right Arm)   Pulse 104   Temp 99.9 F (37.7 C) (Oral)   Resp 16   LMP 10/04/2016 (Approximate)   SpO2 100%  General appearance: Thin adult female, alert and in no acute distress.   Eyes: Anicteric, conjunctiva pink, lids and lashes normal. PERRL.    ENT:  No nasal deformity, discharge, epistaxis.  Hearing normal. OP moist without lesions.   Neck: No neck masses.  Trachea midline.  No thyromegaly/tenderness. Lymph: No cervical or supraclavicular lymphadenopathy. Skin: Warm and dry.  No jaundice.  No suspicious rashes or lesions. Cardiac: Tachycardic, regular, nl S1-S2, no murmurs appreciated.  Capillary refill is brisk.  JVP normal.  No LE edema.  Radial pulses 2+ and symmetric. Respiratory: Normal respiratory rate and rhythm.  CTAB without rales or wheezes.  Poor inspiratory effort, basically diminished throughout. Abdomen: Abdomen soft.  No TTP. No ascites, distension, hepatosplenomegaly.   MSK: No deformities or effusions.  No cyanosis or clubbing. Neuro: Cranial nerves normal.  Sensation intact to light touch. Speech is fluent.  Muscle strength normal.    Psych: Sensorium intact and responding to questions, attention normal.  Behavior appropriate.  Affect normal.  Judgment and insight appear normal.     Labs on Admission:  I have personally reviewed following labs and imaging studies: CBC:  Recent Labs Lab 12/03/16 1612  WBC 8.2  HGB 10.0*  HCT  32.1*  MCV 84.5  PLT 932   Basic Metabolic Panel:  Recent Labs Lab 12/03/16 1612  NA 142  K 3.0*  CL 110  CO2 23  GLUCOSE 97  BUN 8  CREATININE 1.27*  CALCIUM 8.9   GFR: Estimated Creatinine Clearance: 50.2 mL/min (by C-G formula based on SCr of 1.27 mg/dL (H)).  Liver Function Tests:  Recent Labs Lab 12/03/16 1612  AST 16  ALT 11*  ALKPHOS 59  BILITOT 0.8  PROT 7.0  ALBUMIN 3.6    Recent Labs Lab 12/03/16 1612  LIPASE 22   No results for input(s): AMMONIA in the last 168 hours. Coagulation Profile: No results for input(s): INR, PROTIME in the last 168 hours. Cardiac Enzymes: No results for input(s): CKTOTAL, CKMB, CKMBINDEX, TROPONINI in the last 168 hours. BNP (last 3 results) No results for input(s): PROBNP in the last 8760 hours. HbA1C: No results for input(s): HGBA1C in the last 72 hours. CBG: No results for input(s): GLUCAP in the last 168 hours. Lipid Profile: No results for input(s): CHOL, HDL, LDLCALC, TRIG, CHOLHDL, LDLDIRECT in the last 72 hours. Thyroid Function Tests: No results for input(s): TSH, T4TOTAL, FREET4, T3FREE, THYROIDAB in the last 72 hours. Anemia Panel: No results for input(s): VITAMINB12, FOLATE, FERRITIN, TIBC, IRON, RETICCTPCT in the last 72 hours. Sepsis Labs: Lactic acid 2.36 Invalid input(s): PROCALCITONIN, LACTICIDVEN No results found for this or any previous visit (from the past 240 hour(s)).       Radiological Exams on Admission: Personally reviewed CT report: Ct Renal Stone Study  Result Date: 12/03/2016 CLINICAL DATA:  Abdominal and flank pain. Kidney stone removed 2 days ago. Vomiting since yesterday. History of colon cancer. EXAM: CT ABDOMEN AND PELVIS WITHOUT CONTRAST TECHNIQUE: Multidetector CT imaging of the abdomen and pelvis was performed following the standard protocol without IV contrast. COMPARISON:  November 21, 2016 FINDINGS: Lower chest: No acute abnormality. Hepatobiliary: Stones or sludge seen  posteriorly in the gallbladder. Focal fatty deposition is seen adjacent to the falciform ligament. The liver is otherwise normal. Pancreas: Unremarkable. No pancreatic ductal dilatation or surrounding inflammatory changes. Spleen: Normal in size without focal abnormality. Adrenals/Urinary Tract: The adrenal glands are normal. Bilateral ureteral stents are identified. The 10 mm stone in the right renal pelvis on the previous study there is much smaller in the interval. A fragment of stone is likely still present measuring 5 by 4 mm today while  the stone measured 10 x 10 mm previously. High attenuation foci are also seen in the lower pole of the right kidney, likely small stone fragments. No perinephric stranding on the right. The right renal pelvis is mildly thick walled today with no significant adjacent stranding. The right ureter contains a stent and is normal in appearance. No stones are seen along the course of the right ureter. There is a cyst in the left kidney. No definitive renal stones are seen in the left. The previously seen left-sided hydronephrosis has nearly resolved in the interval. There is still stranding inferior to the left kidney which is stable. The stranding extends down the pericolic gutter and along the left ureter which is still prominent in caliber proximally. No stones seen along the course of the left ureteral stent. Air in the bladder is likely from recent instrumentation. Ureteral stents and in the bladder. Stomach/Bowel: The stomach and small bowel are normal. The patient is status post partial colectomy with a right lower quadrant ostomy. Vascular/Lymphatic: No significant vascular findings are present. No enlarged abdominal or pelvic lymph nodes. Reproductive: There is an apparent mass just to the left of the superior aspect of the uterus, possibly the left ovary or fallopian tube measuring 2.3 x 5.4 cm. The uterus and right ovary is normal in appearance. Other: There is a fluid  collection posterior to the rectum on series 2, image 73 measuring 3.9 x 2.0 cm today versus 3.9 x 1.6 cm previously, similar in the interval. There is fluid in the left side of the pelvis anteriorly which is increased in the interval. There is also fluid in the inferior right pelvis which has increased in the interval. This is best seen on coronal image 35. No free air. Musculoskeletal: No acute or significant osseous findings. IMPRESSION: 1. The 10 mm stone in the right renal pelvis is no longer visualized. Multiple small stones in the right kidney and right renal pelvis may represent fragments from the previous larger stone. The right renal pelvis is mildly thick walled today, new in the interval. This could be due to a interval treatment of the right renal stone potentially. No hydronephrosis on the right. No stones along the course of the right ureter. 2. No stones seen in the left kidney. The previously seen left-sided hydronephrosis is significantly improved. However, there still stranding in the fat inferior to the left kidney and along the prominent left ureter. The stranding along the left ureter has not improved at all in the interval. The cause for the ongoing stranding is not seen as no stones are seen along the course of the left ureter. 3. There is an apparent mass to the left of the uterus which may be ovarian/adnexal. This was not seen previously. Ultrasound of the pelvis could better evaluate. 4. Increasing fluid in the pelvis bilaterally is nonspecific. It is possible this could be physiologic from a ruptured ovarian cyst in a patient of this age. However, it could be reactive from a renal etiology given the thickening of the right renal pelvis and the significant stranding around the left ureter and lower kidney. Urine cannot be completely excluded given interval instrumentation but there is no fluid around the right kidney or proximal ureter to suggest the fluid represents urine. Electronically  Signed   By: Dorise Bullion III M.D   On: 12/03/2016 18:59        Assessment/Plan  1. Sepsis likely urinary source:  Suspected source urinary. Organism unknown.  No respiratory  symptoms, no focal signs on lung exam.  Patient technically doesn't meet SIRS criteria but describes pyelonephritis symptoms and has evidence of organ dysfunction (elevated lactic acid, 2.36).  -Sepsis bundle utilized:  -Blood and urine cultures drawn  -1.5L bolus given in ED, will repeat lactic acid  -Start targeted antibiotics with ceftriaxone, based on suspected source of infection (was recently instrumented, but hasn't been on Abx since and last culture was Klebsiella only)  -Repeat renal function and complete blood count in AM  -Code SEPSIS called to [place] -Urology is consulted, appreciate cares -Scheduled acetaminophen for pain, oxycodone PRN -Ondansetron for nausea   2. Hypokalemia:  -Check mag -IVF with K overnight -Continue home K and mag ox tomorrow  3. Pelvic mass:  Noted to left of uterus, possibly adnexal, not described as abscess. -Ultrasound pelvis ordered to better characterize  4. Anemia:  This is chronic, has been iron deficient and B12 deficient in the past. -Check ferritin, B12 levels -Continue B12 suppl  5. Other medications:  -Continue lorazepam PRN (takes usually once daily) -Continue citalopram       DVT prophylaxis: Lovenox  Code Status: FULL  Family Communication: None present  Disposition Plan: Anticipate IV fluids, IV antibiotics and trend lactic acid overnight.  Tailor antibiotics to culture data. Consults called: Urology Admission status: INPATIENT, med surg        Medical decision making: Patient seen at 8:30 PM on 12/03/2016.  The patient was discussed with Dr. Jeffie Pollock and Dr. Ralene Bathe.  What exists of the patient's chart was reviewed in depth and summarized above.  Clinical condition: heart rate improving, stable for medical floor.        Edwin Dada [facility] Pager [phone]      At the time of admission, it appears that the appropriate admission status for this patient is INPATIENT. This is judged to be reasonable and necessary in order to provide the required intensity of service to ensure the patient's safety given the presenting symptoms, physical exam findings, and initial radiographic and laboratory data in the context of their chronic comorbidities.  Together, these circumstances are felt to place her at high risk for further clinical deterioration threatening life, limb, or organ.   Patient requires inpatient status due to high intensity of service, high risk for further deterioration and high frequency of surveillance required because of this acute illness that poses a threat to life, limb or bodily function.  Factors that support her current status include: Presentation with vomiting preventing her from taking home oral medications Evidence of urinary tract infection with sepsis physiology, elevated lactic acid 2.36 Chronic anemia, Chronic ileostomy, hypokalemia    I certify that at the point of admission it is my clinical judgment that the patient will require inpatient hospital care spanning beyond 2 midnights from the point of admission and that early discharge would result in unnecessary risk of decompensation and readmission or threat to life, limb or bodily function.

## 2016-12-03 NOTE — Progress Notes (Signed)
Pharmacy Antibiotic Note  Cindy [name] is a 26 y.o. female with PMH FAP c/b colon CA s/p total colectomy and ileostomy, recurrent nephrolithiasis and recent stent placement who presents with chills, vomiting and flank pain, urinary irritative symptoms. Pharmacy has been consulted for Rocephin for pyelonephritis.   Plan:  Rocephin 1g IV q24  Pharmacy will sign off as no further dose adjustments anticipated.  Height: 5\' 1"  (154.9 cm) Weight: 110 lb (49.9 kg) (per patient, no bed scale ) IBW/kg (Calculated) : 47.8  Temp (24hrs), Avg:99.4 F (37.4 C), Min:98.8 F (37.1 C), Max:99.9 F (37.7 C)   Recent Labs Lab 12/03/16 1612 12/03/16 1710  WBC 8.2  --   CREATININE 1.27*  --   LATICACIDVEN  --  2.36*    Estimated Creatinine Clearance: 50.2 mL/min (by C-G formula based on SCr of 1.27 mg/dL (H)).    Allergies  Allergen Reactions  . Venofer [Ferric Oxide] Anaphylaxis  . Soap Hives, Itching and Other (See Comments)     allergic to [employer].    . Food Hives and Other (See Comments)     allergic to orange juice.   . Morphine And Related Swelling    When given IV, swelling around site  . Sulfamethoxazole-Trimethoprim Nausea And Vomiting    Patient developed AKI that appears to have been from bactrim. She might be able to take again if watched closely and it is absolutely needed - ie there was no anaphylaxis or allergic rash  . Iron Palpitations  . Levaquin [Levofloxacin] Hives     Thank you for allowing pharmacy to be a part of this patient's care.  Reuel Boom, PharmD, BCPS Pager: [phone] 12/03/2016, 9:59 PM

## 2016-12-03 NOTE — ED Notes (Signed)
Patient in CT, will attempt draw when they returm.

## 2016-12-03 NOTE — ED Notes (Signed)
Bed: WA21 Expected date:  Expected time:  Means of arrival:  Comments: [facility]

## 2016-12-03 NOTE — Consult Note (Signed)
Subjective: I was asked to see Cindy [name] in consultation by Dr. Ralene Bathe for fever and abdominal pain.   She had a bilateral ureteroscopy with stent exchange by Dr. Tresa Moore 2 days ago and returns with mid to lower abdominal pain and band like back pain.   Her UA looks infected and she has a fever to 99.9 with an elevated lactic acid.   A CT shows the stents in good position.  There is some fluid in the pelvic and a new left uterine/adnexal mass.    ROS:  Review of Systems  Constitutional: Positive for chills. Negative for fever.  Gastrointestinal: Positive for abdominal pain, nausea and vomiting.  All other systems reviewed and are negative.   Allergies  Allergen Reactions  . Venofer [Ferric Oxide] Anaphylaxis  . Soap Hives, Itching and Other (See Comments)     allergic to [employer].    . Food Hives and Other (See Comments)     allergic to orange juice.   . Morphine And Related Swelling    When given IV, swelling around site  . Sulfamethoxazole-Trimethoprim Nausea And Vomiting    Patient developed AKI that appears to have been from bactrim. She might be able to take again if watched closely and it is absolutely needed - ie there was no anaphylaxis or allergic rash  . Iron Palpitations  . Levaquin [Levofloxacin] Hives    Past Medical History:  Diagnosis Date  . Bilateral flank pain    due to ureteral stents  . Chronic hypokalemia   . 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 Complication of anesthesia    post op aspiration pneumonia w/ lap. appy 12-23-2007 (emergent ruptured appendix)  . FAP (familial adenomatous polyposis) 09/30/2014  . Heart murmur   . History of acute renal failure    2015;  2016  . History of fetal demise, not currently pregnant    03-22-2013  stillborn at 18 wks  . History  of kidney stones   . Ileostomy in place [facility])   . Iron deficiency anemia   . Nephrolithiasis    BILATERAL   . Renal cyst, left    PER CT 11-12-2016  . Urgency of urination   . UTI (lower urinary tract infection)     Past Surgical History:  Procedure Laterality Date  . COLONOSCOPY N/A 05/24/2013   Procedure: COLONOSCOPY;  Surgeon: Missy Sabins, MD;  Location: [place];  Service: Endoscopy;  Laterality: N/A;  . COMPLETION PROTECTOMY AND REVISION ILEOSTOMY/ LYSIS ADHESIONS  07-28-2014    [facility]  . CYSTOSCOPY W/ URETERAL STENT PLACEMENT Bilateral 11/01/2016   Procedure: CYSTOSCOPY WITH BILATERAL RETROGRADE PYELOGRAM BILATERAL URETERAL STENT PLACEMENT;  Surgeon: Alexis Frock, MD;  Location: [facility];  Service: Urology;  Laterality: Bilateral;  . I&D EXTREMITY Left 11/14/2013   Procedure: IRRIGATION AND DEBRIDEMENT LEFT LONG FINGER;  Surgeon: Tennis Must, MD;  Location: [place];  Service: Orthopedics;  Laterality: Left;  . IM NAILING TIBIA Left 08/23/2010  . LAPAROSCOPIC APPENDECTOMY  12/23/2007  . LEG RECONSTRUCTION USING FASCIAL FLAP  ~ 2009  . SUBTOTAL COLECTOMY /  RESECTION ENBLOC DISTAL ILEUM/  ILEOSTOMY  07/ 2014  [facility]  . TRANSTHORACIC ECHOCARDIOGRAM  11/11/2011   ef 55-60%/  mild MR/  trivial PR and TR/  PASP 29-81mmHg/  trivial pericardial effusion identified    Social History   Social History  . Marital status: Single    Spouse name: N/A  . Number of children: N/A  . Years of education: N/A   Occupational History  . Unemployed    Social History Main Topics  . Smoking status: Never Smoker  . Smokeless tobacco: Never Used  . Alcohol use No  . Drug use: No  . Sexual activity: Not on file   Other Topics Concern  . Not on file   Social History Narrative   ** Merged History Encounter **       Lives with her father.  Unemployed.  Previously worked at [employer].    Family History  Problem Relation Age of Onset  . Adopted: Yes  . ALS Mother   .  Alcohol abuse Neg Hx   . Arthritis Neg Hx   . Asthma Neg Hx   . Birth defects Neg Hx   . Cancer Neg Hx   . COPD Neg Hx   . Depression Neg Hx   . Diabetes Neg Hx   . Drug abuse Neg Hx   . Early death Neg Hx   . Hearing loss Neg Hx   . Heart disease Neg Hx   . Hyperlipidemia Neg Hx   . Hypertension Neg Hx   . Kidney disease Neg Hx   . Learning disabilities Neg Hx   . Mental illness Neg Hx   . Mental retardation Neg Hx   . Miscarriages / Stillbirths Neg Hx   . Stroke Neg Hx   . Vision loss Neg Hx     Anti-infectives: Anti-infectives    Start     Dose/Rate Route Frequency Ordered Stop   12/03/16 1730  aztreonam (AZACTAM) 1 g in dextrose 5 % 50 mL IVPB     1 g 100 mL/hr over 30 Minutes Intravenous  Once 12/03/16 1719 12/03/16 1849      Current Facility-Administered Medications  Medication Dose Route Frequency Provider Last Rate Last Dose  . 0.9 % NaCl with KCl 20 mEq/ L  infusion   Intravenous Continuous Edwin Dada, MD      . potassium chloride SA (K-DUR,KLOR-CON) CR tablet 40 mEq  40 mEq Oral Once Edwin Dada, MD       Current Outpatient Prescriptions  Medication Sig Dispense Refill  . citalopram (CELEXA) 10 MG tablet Take 10 mg by mouth at bedtime.    . cyanocobalamin (,VITAMIN B-12,) 1000 MCG/ML injection Inject 1,000 mcg into the muscle every 30 (thirty) days.    Marland Kitchen LORazepam (ATIVAN) 1 MG tablet Take 1 mg by mouth 3 (three) times daily as needed for anxiety.    . magnesium oxide (MAG-OX) 400 (241.3 Mg) MG tablet Take 400 mg by mouth daily.    . Multiple Vitamins-Minerals (MULTIVITAMIN GUMMIES ADULT) CHEW Chew 1 each by mouth daily.     . ondansetron (ZOFRAN ODT) 4 MG disintegrating tablet Take 1 tablet (4 mg total) by mouth every 8 (eight) hours as needed for nausea. 6 tablet 0  . oxyCODONE-acetaminophen (PERCOCET/ROXICET) 5-325 MG tablet Take 1-2 tablets by mouth every 4 (four) hours as needed for severe pain. Post-operatively / from kidney stones. 30  tablet 0  . scopolamine (TRANSDERM-SCOP) 1 MG/3DAYS Place 1 patch onto the skin every 3 (three) days.    . Vitamin D, Ergocalciferol, (DRISDOL) 50000 units CAPS capsule Take 50,000 Units by mouth every 7 (seven) days. ON WEDNESDAYS    . cefdinir (  OMNICEF) 300 MG capsule Take 300 mg by mouth 2 (two) times daily.    . potassium chloride 20 MEQ/15ML (10%) SOLN Take 20 mLs by mouth 4 (four) times daily.        Objective: Vital signs in last 24 hours: Temp:  [99.9 F (37.7 C)] 99.9 F (37.7 C) (01/07 1705) Pulse Rate:  [104-149] 104 (01/07 1816) Resp:  [16-18] 16 (01/07 1816) BP: (111-116)/(65-75) 111/65 (01/07 1816) SpO2:  [98 %-100 %] 100 % (01/07 1816)  Intake/Output from previous day: No intake/output data recorded. Intake/Output this shift: Total I/O In: 500 [IV Piggyback:500] Out: -    Physical Exam  Constitutional: She is well-developed, well-nourished, and in no distress. No distress.  Cardiovascular:  Sinus tachycardia  Pulmonary/Chest: Effort normal. No respiratory distress.  Abdominal: Soft. There is tenderness.  She has an ileostomy.   Vitals reviewed.   Lab Results:   Recent Labs  12/03/16 1612  WBC 8.2  HGB 10.0*  HCT 32.1*  PLT 282   BMET  Recent Labs  12/03/16 1612  NA 142  K 3.0*  CL 110  CO2 23  GLUCOSE 97  BUN 8  CREATININE 1.27*  CALCIUM 8.9   PT/INR No results for input(s): LABPROT, INR in the last 72 hours. ABG No results for input(s): PHART, HCO3 in the last 72 hours.  Invalid input(s): PCO2, PO2  Studies/Results: Ct Renal Stone Study  Result Date: 12/03/2016 CLINICAL DATA:  Abdominal and flank pain. Kidney stone removed 2 days ago. Vomiting since yesterday. History of colon cancer. EXAM: CT ABDOMEN AND PELVIS WITHOUT CONTRAST TECHNIQUE: Multidetector CT imaging of the abdomen and pelvis was performed following the standard protocol without IV contrast. COMPARISON:  November 21, 2016 FINDINGS: Lower chest: No acute abnormality.  Hepatobiliary: Stones or sludge seen posteriorly in the gallbladder. Focal fatty deposition is seen adjacent to the falciform ligament. The liver is otherwise normal. Pancreas: Unremarkable. No pancreatic ductal dilatation or surrounding inflammatory changes. Spleen: Normal in size without focal abnormality. Adrenals/Urinary Tract: The adrenal glands are normal. Bilateral ureteral stents are identified. The 10 mm stone in the right renal pelvis on the previous study there is much smaller in the interval. A fragment of stone is likely still present measuring 5 by 4 mm today while the stone measured 10 x 10 mm previously. High attenuation foci are also seen in the lower pole of the right kidney, likely small stone fragments. No perinephric stranding on the right. The right renal pelvis is mildly thick walled today with no significant adjacent stranding. The right ureter contains a stent and is normal in appearance. No stones are seen along the course of the right ureter. There is a cyst in the left kidney. No definitive renal stones are seen in the left. The previously seen left-sided hydronephrosis has nearly resolved in the interval. There is still stranding inferior to the left kidney which is stable. The stranding extends down the pericolic gutter and along the left ureter which is still prominent in caliber proximally. No stones seen along the course of the left ureteral stent. Air in the bladder is likely from recent instrumentation. Ureteral stents and in the bladder. Stomach/Bowel: The stomach and small bowel are normal. The patient is status post partial colectomy with a right lower quadrant ostomy. Vascular/Lymphatic: No significant vascular findings are present. No enlarged abdominal or pelvic lymph nodes. Reproductive: There is an apparent mass just to the left of the superior aspect of the uterus, possibly the left ovary  or fallopian tube measuring 2.3 x 5.4 cm. The uterus and right ovary is normal in  appearance. Other: There is a fluid collection posterior to the rectum on series 2, image 73 measuring 3.9 x 2.0 cm today versus 3.9 x 1.6 cm previously, similar in the interval. There is fluid in the left side of the pelvis anteriorly which is increased in the interval. There is also fluid in the inferior right pelvis which has increased in the interval. This is best seen on coronal image 35. No free air. Musculoskeletal: No acute or significant osseous findings. IMPRESSION: 1. The 10 mm stone in the right renal pelvis is no longer visualized. Multiple small stones in the right kidney and right renal pelvis may represent fragments from the previous larger stone. The right renal pelvis is mildly thick walled today, new in the interval. This could be due to a interval treatment of the right renal stone potentially. No hydronephrosis on the right. No stones along the course of the right ureter. 2. No stones seen in the left kidney. The previously seen left-sided hydronephrosis is significantly improved. However, there still stranding in the fat inferior to the left kidney and along the prominent left ureter. The stranding along the left ureter has not improved at all in the interval. The cause for the ongoing stranding is not seen as no stones are seen along the course of the left ureter. 3. There is an apparent mass to the left of the uterus which may be ovarian/adnexal. This was not seen previously. Ultrasound of the pelvis could better evaluate. 4. Increasing fluid in the pelvis bilaterally is nonspecific. It is possible this could be physiologic from a ruptured ovarian cyst in a patient of this age. However, it could be reactive from a renal etiology given the thickening of the right renal pelvis and the significant stranding around the left ureter and lower kidney. Urine cannot be completely excluded given interval instrumentation but there is no fluid around the right kidney or proximal ureter to suggest the  fluid represents urine. Electronically Signed   By: Dorise Bullion III M.D   On: 12/03/2016 18:59   I have discussed her case with Dr. Ralene Bathe and reviewed the recent notes.   I have reviewed her CT films and report.   I have reviewed her labs.   Assessment: 1.  History of bilateral stones with recent ureteroscopy and stent exchange.  The stents are in good position and the kidneys are decompressed.     2.  UTI with fever and elevated lactate.    I have ask she be admitted by the hospitalist service for management of the infection.   3. Pelvic fluid that could be from an ovarian cyst rupture or possibly related to her recent procedure, but that is less likely.  4.  Left uterine/adnexal mass and pelvic US was recommended.   That would be best completed when her acute condition is controlled.   5.  The pelvic and back pain could be secondary to the stents or possibly the pelvic process.     CC: Dr. Quintella Reichert and Dr. Phebe Colla.      Cindy [name] J 12/03/2016 [phone]

## 2016-12-03 NOTE — ED Provider Notes (Signed)
[facility] DEPT Provider Note   CSN: [id number] Arrival date & time: 12/03/16  1518     History   Chief Complaint Chief Complaint  Patient presents with  . Abdominal Pain  . Emesis    HPI Cindy [name] is a 26 y.o. female.  The history is provided by the patient. No language interpreter was used.  Abdominal Pain   Associated symptoms include vomiting.  Emesis   Associated symptoms include abdominal pain.   Cindy [name] is a 26 y.o. female who presents to the Emergency Department complaining of abdominal pain, vomiting.  2 days ago she had ureteral stent placed for kidney stones. Since discharge she reports nausea, vomiting, abdominal pain and chills. She reports  lower abdominal pain radiating to her lower back. She reports multiple episodes of emesis, 4 episodes today. She reports feeling cold and shaking but no reports of fevers. She has been vomiting all her medications. She has a history of ileostomy for colon cancer and reports decreased output from her ileostomy. Past Medical History:  Diagnosis Date  . Bilateral flank pain    due to ureteral stents  . Chronic hypokalemia   . 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 Complication of anesthesia    post op aspiration pneumonia w/ lap. appy 12-23-2007 (emergent ruptured appendix)  . FAP (familial adenomatous polyposis) 09/30/2014  . Heart murmur   . History of acute renal failure    2015;  2016  . History of fetal demise, not currently pregnant    03-22-2013  stillborn at 7 wks  . History of kidney stones   . Ileostomy in place [facility])   . Iron deficiency anemia   . Nephrolithiasis    BILATERAL   . Renal cyst, left    PER CT 11-12-2016  . Urgency of urination   . UTI (lower urinary tract infection)     Patient Active  Problem List   Diagnosis Date Noted  . Sepsis, unspecified organism ([place]) 12/03/2016  . 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 Normocytic anemia 05/02/2015  . High output ileostomy ([place]) 09/30/2014  . FAP (familial adenomatous polyposis) 09/30/2014  . Nephrolithiasis 09/30/2014  . Adenocarcinoma of colon ([place]) 05/26/2013  . Nausea with vomiting 05/21/2013  . Hypokalemia 04/25/2013    Past Surgical History:  Procedure Laterality Date  . COLONOSCOPY N/A 05/24/2013   Procedure: COLONOSCOPY;  Surgeon: Missy Sabins, MD;  Location: [facility];  Service: Endoscopy;  Laterality: N/A;  . COMPLETION PROTECTOMY AND REVISION ILEOSTOMY/ LYSIS ADHESIONS  07-28-2014    [facility]  . CYSTOSCOPY W/ URETERAL STENT PLACEMENT Bilateral 11/01/2016   Procedure: CYSTOSCOPY WITH BILATERAL RETROGRADE PYELOGRAM BILATERAL URETERAL STENT PLACEMENT;  Surgeon: Alexis Frock, MD;  Location: [facility];  Service: Urology;  Laterality: Bilateral;  . I&D EXTREMITY Left 11/14/2013   Procedure: IRRIGATION AND DEBRIDEMENT LEFT LONG FINGER;  Surgeon: Tennis Must, MD;  Location: [facility];  Service: Orthopedics;  Laterality: Left;  . IM NAILING TIBIA Left 08/23/2010  . LAPAROSCOPIC APPENDECTOMY  12/23/2007  . LEG RECONSTRUCTION USING FASCIAL FLAP  ~ 2009  . SUBTOTAL COLECTOMY /  RESECTION ENBLOC DISTAL ILEUM/  ILEOSTOMY  07/ 2014  [facility]  . TRANSTHORACIC ECHOCARDIOGRAM  11/11/2011   ef 55-60%/  mild MR/  trivial PR  and TR/  PASP 29-39mmHg/  trivial pericardial effusion identified    OB History    Gravida Para Term Preterm AB Living   1 1 0 1 0     SAB TAB Ectopic Multiple Live Births   0 0 0           Home Medications    Prior to Admission medications   Medication Sig Start Date End Date Taking? Authorizing Provider  citalopram (CELEXA) 10 MG tablet Take 10 mg by mouth at bedtime.   Yes Historical Provider, MD  cyanocobalamin (,VITAMIN B-12,) 1000 MCG/ML injection Inject 1,000 mcg into the  muscle every 30 (thirty) days.   Yes Historical Provider, MD  LORazepam (ATIVAN) 1 MG tablet Take 1 mg by mouth 3 (three) times daily as needed for anxiety.   Yes Historical Provider, MD  magnesium oxide (MAG-OX) 400 (241.3 Mg) MG tablet Take 400 mg by mouth daily.   Yes Historical Provider, MD  Multiple Vitamins-Minerals (MULTIVITAMIN GUMMIES ADULT) CHEW Chew 1 each by mouth daily.    Yes Historical Provider, MD  ondansetron (ZOFRAN ODT) 4 MG disintegrating tablet Take 1 tablet (4 mg total) by mouth every 8 (eight) hours as needed for nausea. 11/12/16  Yes Tanna Furry, MD  oxyCODONE-acetaminophen (PERCOCET/ROXICET) 5-325 MG tablet Take 1-2 tablets by mouth every 4 (four) hours as needed for severe pain. Post-operatively / from kidney stones. 12/01/16  Yes Alexis Frock, MD  scopolamine (TRANSDERM-SCOP) 1 MG/3DAYS Place 1 patch onto the skin every 3 (three) days. 12/01/16 12/04/16 Yes Historical Provider, MD  Vitamin D, Ergocalciferol, (DRISDOL) 50000 units CAPS capsule Take 50,000 Units by mouth every 7 (seven) days. ON [facility] 05/22/16  Yes Historical Provider, MD  potassium chloride 20 MEQ/15ML (10%) SOLN Take 20 mLs by mouth 4 (four) times daily.     Historical Provider, MD    Family History Family History  Problem Relation Age of Onset  . Adopted: Yes  . ALS Mother   . Alcohol abuse Neg Hx   . Arthritis Neg Hx   . Asthma Neg Hx   . Birth defects Neg Hx   . Cancer Neg Hx   . COPD Neg Hx   . Depression Neg Hx   . Diabetes Neg Hx   . Drug abuse Neg Hx   . Early death Neg Hx   . Hearing loss Neg Hx   . Heart disease Neg Hx   . Hyperlipidemia Neg Hx   . Hypertension Neg Hx   . Kidney disease Neg Hx   . Learning disabilities Neg Hx   . Mental illness Neg Hx   . Mental retardation Neg Hx   . Miscarriages / Stillbirths Neg Hx   . Stroke Neg Hx   . Vision loss Neg Hx     Social History Social History  Substance Use Topics  . Smoking status: Never Smoker  . Smokeless tobacco: Never  Used  . Alcohol use No     Allergies   Venofer [ferric oxide]; Soap; Food; Morphine and related; Sulfamethoxazole-trimethoprim; Iron; and Levaquin [levofloxacin]   Review of Systems Review of Systems  Gastrointestinal: Positive for abdominal pain and vomiting.  All other systems reviewed and are negative.    Physical Exam Updated Vital Signs BP 103/70 (BP Location: Left Arm)   Pulse 95   Temp 98.8 F (37.1 C) (Oral)   Resp 19   Ht 5\' 1"  (1.549 m)   Wt 110 lb (49.9 kg) Comment: per patient, no bed scale  LMP 10/04/2016 (Approximate)   SpO2 100%   BMI 20.78 kg/m   Physical Exam  Constitutional: She is oriented to person, place, and time. She appears well-developed and well-nourished.  HENT:  Head: Normocephalic and atraumatic.  Cardiovascular: Regular rhythm.   No murmur heard. Tachycardic  Pulmonary/Chest: Effort normal and breath sounds normal. No respiratory distress.  Abdominal: Soft. There is no rebound and no guarding.  Mild lower abdominal tenderness. Ileostomy in right lower quadrant with dark fluid in bag.  Musculoskeletal: She exhibits no edema or tenderness.  Neurological: She is alert and oriented to person, place, and time.  Skin: Skin is warm and dry.  Psychiatric: She has a normal mood and affect. Her behavior is normal.  Nursing note and vitals reviewed.    ED Treatments / Results  Labs (all labs ordered are listed, but only abnormal results are displayed) Labs Reviewed  COMPREHENSIVE METABOLIC PANEL - Abnormal; Notable for the following:       Result Value   Potassium 3.0 (*)    Creatinine, Ser 1.27 (*)    ALT 11 (*)    GFR calc non Af Amer 57 (*)    All other components within normal limits  CBC - Abnormal; Notable for the following:    RBC 3.80 (*)    Hemoglobin 10.0 (*)    HCT 32.1 (*)    All other components within normal limits  URINALYSIS, ROUTINE W REFLEX MICROSCOPIC - Abnormal; Notable for the following:    APPearance HAZY (*)      Hgb urine dipstick LARGE (*)    Protein, ur 100 (*)    Leukocytes, UA LARGE (*)    All other components within normal limits  URINALYSIS, MICROSCOPIC (REFLEX) - Abnormal; Notable for the following:    Bacteria, UA FEW (*)    Squamous Epithelial / LPF 6-30 (*)    All other components within normal limits  MAGNESIUM - Abnormal; Notable for the following:    Magnesium 1.3 (*)    All other components within normal limits  I-STAT CG4 LACTIC ACID, ED - Abnormal; Notable for the following:    Lactic Acid, Venous 2.36 (*)    All other components within normal limits  CULTURE, BLOOD (ROUTINE X 2)  CULTURE, BLOOD (ROUTINE X 2)  URINE CULTURE  LIPASE, BLOOD  LACTIC ACID, PLASMA  I-STAT BETA HCG BLOOD, ED ([facility], [facility], [facility] ONLY)    EKG  EKG Interpretation None       Radiology Ct Renal Stone Study  Result Date: 12/03/2016 CLINICAL DATA:  Abdominal and flank pain. Kidney stone removed 2 days ago. Vomiting since yesterday. History of colon cancer. EXAM: CT ABDOMEN AND PELVIS WITHOUT CONTRAST TECHNIQUE: Multidetector CT imaging of the abdomen and pelvis was performed following the standard protocol without IV contrast. COMPARISON:  November 21, 2016 FINDINGS: Lower chest: No acute abnormality. Hepatobiliary: Stones or sludge seen posteriorly in the gallbladder. Focal fatty deposition is seen adjacent to the falciform ligament. The liver is otherwise normal. Pancreas: Unremarkable. No pancreatic ductal dilatation or surrounding inflammatory changes. Spleen: Normal in size without focal abnormality. Adrenals/Urinary Tract: The adrenal glands are normal. Bilateral ureteral stents are identified. The 10 mm stone in the right renal pelvis on the previous study there is much smaller in the interval. A fragment of stone is likely still present measuring 5 by 4 mm today while the stone measured 10 x 10 mm previously. High attenuation foci are also seen in the lower pole of the right  kidney, likely small stone  fragments. No perinephric stranding on the right. The right renal pelvis is mildly thick walled today with no significant adjacent stranding. The right ureter contains a stent and is normal in appearance. No stones are seen along the course of the right ureter. There is a cyst in the left kidney. No definitive renal stones are seen in the left. The previously seen left-sided hydronephrosis has nearly resolved in the interval. There is still stranding inferior to the left kidney which is stable. The stranding extends down the pericolic gutter and along the left ureter which is still prominent in caliber proximally. No stones seen along the course of the left ureteral stent. Air in the bladder is likely from recent instrumentation. Ureteral stents and in the bladder. Stomach/Bowel: The stomach and small bowel are normal. The patient is status post partial colectomy with a right lower quadrant ostomy. Vascular/Lymphatic: No significant vascular findings are present. No enlarged abdominal or pelvic lymph nodes. Reproductive: There is an apparent mass just to the left of the superior aspect of the uterus, possibly the left ovary or fallopian tube measuring 2.3 x 5.4 cm. The uterus and right ovary is normal in appearance. Other: There is a fluid collection posterior to the rectum on series 2, image 73 measuring 3.9 x 2.0 cm today versus 3.9 x 1.6 cm previously, similar in the interval. There is fluid in the left side of the pelvis anteriorly which is increased in the interval. There is also fluid in the inferior right pelvis which has increased in the interval. This is best seen on coronal image 35. No free air. Musculoskeletal: No acute or significant osseous findings. IMPRESSION: 1. The 10 mm stone in the right renal pelvis is no longer visualized. Multiple small stones in the right kidney and right renal pelvis may represent fragments from the previous larger stone. The right renal pelvis is mildly thick walled today,  new in the interval. This could be due to a interval treatment of the right renal stone potentially. No hydronephrosis on the right. No stones along the course of the right ureter. 2. No stones seen in the left kidney. The previously seen left-sided hydronephrosis is significantly improved. However, there still stranding in the fat inferior to the left kidney and along the prominent left ureter. The stranding along the left ureter has not improved at all in the interval. The cause for the ongoing stranding is not seen as no stones are seen along the course of the left ureter. 3. There is an apparent mass to the left of the uterus which may be ovarian/adnexal. This was not seen previously. Ultrasound of the pelvis could better evaluate. 4. Increasing fluid in the pelvis bilaterally is nonspecific. It is possible this could be physiologic from a ruptured ovarian cyst in a patient of this age. However, it could be reactive from a renal etiology given the thickening of the right renal pelvis and the significant stranding around the left ureter and lower kidney. Urine cannot be completely excluded given interval instrumentation but there is no fluid around the right kidney or proximal ureter to suggest the fluid represents urine. Electronically Signed   By: Dorise Bullion III M.D   On: 12/03/2016 18:59    Procedures Procedures (including critical care time)  Medications Ordered in ED Medications  0.9 % NaCl with KCl 20 mEq/ L  infusion ( Intravenous New Bag/Given 12/03/16 2206)  oxyCODONE (Oxy IR/ROXICODONE) immediate release tablet 5-10 mg (10 mg Oral  Given 12/03/16 2158)  ondansetron (ZOFRAN) tablet 4 mg (not administered)    Or  ondansetron (ZOFRAN) injection 4 mg (not administered)  acetaminophen (TYLENOL) tablet 1,000 mg (1,000 mg Oral Given 12/03/16 2203)  cefTRIAXone (ROCEPHIN) 1 g in dextrose 5 % 50 mL IVPB (1 g Intravenous Given 12/03/16 2243)  sodium chloride 0.9 % bolus 1,000 mL (0 mLs Intravenous  Stopped 12/03/16 1736)  HYDROmorphone (DILAUDID) injection 1 mg (1 mg Intravenous Given 12/03/16 1709)  ondansetron (ZOFRAN) injection 4 mg (4 mg Intravenous Given 12/03/16 1709)  aztreonam (AZACTAM) 1 g in dextrose 5 % 50 mL IVPB (0 g Intravenous Stopped 12/03/16 1849)  sodium chloride 0.9 % bolus 500 mL (0 mLs Intravenous Stopped 12/03/16 1933)  HYDROmorphone (DILAUDID) injection 1 mg (1 mg Intravenous Given 12/03/16 1846)  potassium chloride SA (K-DUR,KLOR-CON) CR tablet 40 mEq (40 mEq Oral Given 12/03/16 2203)     Initial Impression / Assessment and Plan / ED Course  I have reviewed the triage vital signs and the nursing notes.  Pertinent labs & imaging results that were available during my care of the patient were reviewed by me and considered in my medical decision making (see chart for details).  Clinical Course     Patient here with lower abdominal pain, chills, vomiting following ureteral stent placement 2 days ago. She has low-grade symmetry of 99.9 with tachycardia on ED arrival. Her pain and vomiting have improved after IV fluid administration and antiemetics. UA is indeterminate for UTI given epithelials.  CBC and BMP stable compared to priors. Will treat empirically for possible UTI given her symptoms and sending cultures. Initial lactate was elevated, question secondary to dehydration versus early sepsis. Following fluid hydration her tachycardia did resolved.  Discussed with Dr. Roni Bread with urology who recommended antibiotics, IV fluids and medicine consult given her other medical conditions. Hospitalist consulted for admission for further treatment.  Final Clinical Impressions(s) / ED Diagnoses   Final diagnoses:  None    New Prescriptions Current Discharge Medication List       Quintella Reichert, MD 12/04/16 [phone]

## 2016-12-03 NOTE — ED Notes (Signed)
This RN attempted blood draw x2.  Was able to obtain x1 set of cultures will have RN or phlebotomy attempt for 2nd set.

## 2016-12-04 ENCOUNTER — Encounter (HOSPITAL_BASED_OUTPATIENT_CLINIC_OR_DEPARTMENT_OTHER): Payer: Self-pay | Admitting: Urology

## 2016-12-04 ENCOUNTER — Inpatient Hospital Stay (HOSPITAL_COMMUNITY): Payer: Medicare Other

## 2016-12-04 LAB — CBC WITH DIFFERENTIAL/PLATELET
BASOS PCT: 0 %
Basophils Absolute: 0 10*3/uL (ref 0.0–0.1)
EOS ABS: 0 10*3/uL (ref 0.0–0.7)
EOS PCT: 0 %
HCT: 30.1 % — ABNORMAL LOW (ref 36.0–46.0)
Hemoglobin: 9.3 g/dL — ABNORMAL LOW (ref 12.0–15.0)
LYMPHS ABS: 1.1 10*3/uL (ref 0.7–4.0)
Lymphocytes Relative: 13 %
MCH: 25.7 pg — AB (ref 26.0–34.0)
MCHC: 30.9 g/dL (ref 30.0–36.0)
MCV: 83.1 fL (ref 78.0–100.0)
Monocytes Absolute: 1 10*3/uL (ref 0.1–1.0)
Monocytes Relative: 12 %
NEUTROS PCT: 75 %
Neutro Abs: 6.2 10*3/uL (ref 1.7–7.7)
Platelets: 225 10*3/uL (ref 150–400)
RBC: 3.62 MIL/uL — AB (ref 3.87–5.11)
RDW: 12.9 % (ref 11.5–15.5)
WBC: 8.4 10*3/uL (ref 4.0–10.5)

## 2016-12-04 LAB — COMPREHENSIVE METABOLIC PANEL
ALBUMIN: 3.3 g/dL — AB (ref 3.5–5.0)
ALK PHOS: 61 U/L (ref 38–126)
ALT: 10 U/L — AB (ref 14–54)
ANION GAP: 8 (ref 5–15)
AST: 14 U/L — ABNORMAL LOW (ref 15–41)
BUN: 8 mg/dL (ref 6–20)
CHLORIDE: 110 mmol/L (ref 101–111)
CO2: 23 mmol/L (ref 22–32)
Calcium: 8.3 mg/dL — ABNORMAL LOW (ref 8.9–10.3)
Creatinine, Ser: 1.22 mg/dL — ABNORMAL HIGH (ref 0.44–1.00)
GFR calc non Af Amer: >60 mL/min (ref >60)
GLUCOSE: 108 mg/dL — AB (ref 65–99)
Potassium: 3.6 mmol/L (ref 3.5–5.1)
SODIUM: 141 mmol/L (ref 135–145)
Total Bilirubin: 0.4 mg/dL (ref 0.3–1.2)
Total Protein: 6.2 g/dL — ABNORMAL LOW (ref 6.5–8.1)

## 2016-12-04 LAB — BASIC METABOLIC PANEL
ANION GAP: 5 (ref 5–15)
BUN: 8 mg/dL (ref 6–20)
CALCIUM: 7.7 mg/dL — AB (ref 8.9–10.3)
CO2: 22 mmol/L (ref 22–32)
Chloride: 113 mmol/L — ABNORMAL HIGH (ref 101–111)
Creatinine, Ser: 1.06 mg/dL — ABNORMAL HIGH (ref 0.44–1.00)
GFR calc Af Amer: >60 mL/min (ref >60)
GLUCOSE: 97 mg/dL (ref 65–99)
Potassium: 3.7 mmol/L (ref 3.5–5.1)
Sodium: 140 mmol/L (ref 135–145)

## 2016-12-04 LAB — CBC
HCT: 26 % — ABNORMAL LOW (ref 36.0–46.0)
Hemoglobin: 8.3 g/dL — ABNORMAL LOW (ref 12.0–15.0)
MCH: 27 pg (ref 26.0–34.0)
MCHC: 31.9 g/dL (ref 30.0–36.0)
MCV: 84.7 fL (ref 78.0–100.0)
PLATELETS: 195 10*3/uL (ref 150–400)
RBC: 3.07 MIL/uL — ABNORMAL LOW (ref 3.87–5.11)
RDW: 13 % (ref 11.5–15.5)
WBC: 5.8 10*3/uL (ref 4.0–10.5)

## 2016-12-04 LAB — PHOSPHORUS: PHOSPHORUS: 2.9 mg/dL (ref 2.5–4.6)

## 2016-12-04 LAB — MAGNESIUM: Magnesium: 1.2 mg/dL — ABNORMAL LOW (ref 1.7–2.4)

## 2016-12-04 MED ORDER — MAGNESIUM OXIDE 400 (241.3 MG) MG PO TABS
400.0000 mg | ORAL_TABLET | Freq: Every day | ORAL | Status: DC
  Administered 2016-12-04 – 2016-12-06 (×3): 400 mg via ORAL
  Filled 2016-12-04 (×3): qty 1

## 2016-12-04 MED ORDER — SENNOSIDES-DOCUSATE SODIUM 8.6-50 MG PO TABS: 1.0000 | ORAL_TABLET | Freq: Every evening | ORAL | Status: DC | PRN

## 2016-12-04 MED ORDER — MAGNESIUM SULFATE 2 GM/50ML IV SOLN
2.0000 g | Freq: Once | INTRAVENOUS | Status: AC
  Administered 2016-12-04: 2 g via INTRAVENOUS
  Filled 2016-12-04: qty 50

## 2016-12-04 MED ORDER — ENOXAPARIN SODIUM 40 MG/0.4ML ~~LOC~~ SOLN
40.0000 mg | SUBCUTANEOUS | Status: DC
  Filled 2016-12-04: qty 0.4

## 2016-12-04 MED ORDER — LORAZEPAM 1 MG PO TABS: 1.0000 mg | ORAL_TABLET | Freq: Three times a day (TID) | ORAL | Status: DC | PRN

## 2016-12-04 MED ORDER — HYDROMORPHONE HCL 2 MG/ML IJ SOLN
1.0000 mg | INTRAMUSCULAR | Status: DC | PRN
  Administered 2016-12-04 – 2016-12-06 (×10): 1 mg via INTRAVENOUS
  Filled 2016-12-04 (×10): qty 1

## 2016-12-04 MED ORDER — CYANOCOBALAMIN 1000 MCG/ML IJ SOLN: 1000.0000 ug | INTRAMUSCULAR | Status: DC

## 2016-12-04 MED ORDER — CITALOPRAM HYDROBROMIDE 20 MG PO TABS
10.0000 mg | ORAL_TABLET | Freq: Every day | ORAL | Status: DC
  Administered 2016-12-04 – 2016-12-05 (×2): 10 mg via ORAL
  Filled 2016-12-04 (×2): qty 1

## 2016-12-04 MED ORDER — HYDROMORPHONE HCL 2 MG/ML IJ SOLN
0.5000 mg | Freq: Once | INTRAMUSCULAR | Status: AC
  Administered 2016-12-04: 0.5 mg via INTRAVENOUS
  Filled 2016-12-04: qty 1

## 2016-12-04 MED ORDER — POTASSIUM CHLORIDE 20 MEQ/15ML (10%) PO SOLN
26.6667 meq | Freq: Four times a day (QID) | ORAL | Status: DC
  Administered 2016-12-04 – 2016-12-06 (×7): 26.6667 meq via ORAL
  Filled 2016-12-04 (×7): qty 30

## 2016-12-04 NOTE — Op Note (Signed)
NAME:  Cindy [name], Cindy [name]                   ACCOUNT NO.:  MEDICAL RECORD NO.:  [id number]  LOCATION:                                 FACILITY:  PHYSICIAN:  Alexis Frock, MD     DATE OF BIRTH:  Aug 29, 1991  DATE OF PROCEDURE: 12/01/2016                              OPERATIVE REPORT   PREOPERATIVE DIAGNOSIS:  Recurrent nephrolithiasis with history of acute renal failure.  PROCEDURE: 1. Cystoscopy with bilateral retrograde pyelogram interpretation. 2. Bilateral ureteroscopy with laser lithotripsy. 3. Exchange of bilateral ureteral stents, 5 x 22 Polaris, no tethers.  ESTIMATED BLOOD LOSS:  Nil.  COMPLICATION:  None.  SPECIMEN:  Bilateral renal stone fragments for compositional analysis.  FINDINGS: 1. Small volume left intrarenal stone, mostly papillary tip.  Left     side completely cleared. 2. Successful exchange of left ureteral stent, proximal in renal     pelvis and distal in urinary bladder. 3. Large volume right intrarenal stone, likely intermittent     obstruction. 4. Resolution of approximately 70% of right-sided renal stone volume     today. 5. Successful exchange of right ureteral stent, proximal in renal     pelvis and distal in urinary bladder.  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  She has subsequently had significant metabolic sequela including recurrent nephrolithiasis, chronic renal insufficiency with bouts of acute renal failure .  She had a recent bout in acute renal failure last month with bilateral hydronephrosis and bacteriuria, which is managed with bilateral stenting.  She has since cleared her infectious parameters.  Options were discussed including bilateral ureteroscopy in staged fashion with goal of stone free as she states she does desire future elective pregnancy and certainly be advantageous to achieve stone free status prior.   Informed consent was obtained and placed in the medical record. Notably most recent urine culture is negative.  PROCEDURE IN DETAIL:  The patient being Cindy [name] confirmed, procedure being bilateral ureteroscopic stone manipulation was confirmed.  Procedure was carried out.  Time-out was performed. Intravenous antibiotics were administered.  General LMA anesthesia was introduced.  The patient was placed into a low lithotomy position. Sterile field was created by prepping and draping the patient's vagina, introitus, and proximal thighs using iodinated prep.  Next, cystourethroscopy was performed using a rigid cystoscope with offset lens.  Distal ends of bilateral  ureteral stents were seen in situ.  These were grasped and removed in their entirety, set aside for discard.  They were moderately encrusted.  The left ureteral orifice was cannulated with a 6- French end-hole catheter and left retrograde pyelogram was obtained.  Left retrograde pyelogram demonstrated a single left ureter with single- system left kidney.  There were no obvious filling defects or narrowing. The kidney had a very linear appearance in terms of renal pelvis.  A 0.038 Zip wire was advanced at the level of the upper pole, set aside as a safety wire.  Similarly, a right retrograde pyelogram was obtained.  Right retrograde pyelogram demonstrated a single right ureter with single-system right kidney.  There was  a ball valving even during this maneuver from a large calcification turned filling defect consistent with known large volume renal pelvis stone.  A 0.038 Zip wire was advanced to the level of the upper pole and set aside as a safety wire.  An 8-French feeding tube was placed in urinary bladder for pressure release.  Next, semi- rigid ureteroscopy from the distal access port performed of the entire length of left ureter alongside a separate Sensor working wire.  No mucosal abnormalities were found.  This was  exchanged for a 12/14, 24-cm ureteral access sheath at the level of proximal ureter using fluoroscopic guidance and flexible digital ureteroscopy was performed of the proximal ureter and systematic inspection of the left kidney including all calices x3.  Multifocal small volume papillary tip calcifications were noted and majority of which were amenable to simple basketing and an Escape type basket was used to grasp the individual stones, which was set aside for compositional analysis.  Dominant upper pole calcification required fragmenting with holmium laser lithotripsy using settings of 0.2 joules and 20 Hz.  The remaining fragments were then amenable to basketing.  Following these maneuvers, excellent hemostasis and no evidence of renal perforation.  Complete resolution of all stone fragments larger than 1/3rd mm on the left side.  The access sheath was removed under continuous vision.  No mucosal abnormalities were found. Similarly, a semi-rigid ureteroscopy was performed of the entire length of the right ureter alongside a separate Sensor working wire.  No mucosal abnormalities were seen.  This was exchanged for the 12/14, 24- cm ureteral access sheath at the level of proximal ureter.  Next, flexible digital ureteroscopy was performed of the proximal ureter and systematic inspection of the right kidney including all calices x3.  The only calcification was very large dominant intrarenal stone, with likely intermittent ball valving as there was some mucosal edema at the area of the UPJ.  This appeared to be much too large for simple basketing.  As such, holmium laser energy applied to the stone using settings of 0.3 joules and 30 hertz.  Using a dusting technique, approximately 70% of the stone volume was able to be ablated in approximately 90 minutes. There were innumerable small fragments apparently generated, most of which were felt to be less than 2 mm.  As the goal is stone  free bilaterally, it was clearly felt that a staged approach would be warranted and we achieved the goal in first stage procedure today With left side completely clear and doing with majority of the right-sided stone.  As such, bilateral 5 x 22 Polaris-type stents were placed using fluoroscopic guidance over the remaining safety wires proximal in upper pole and distal in urinary bladder bilaterally and the procedure was terminated.  The patient tolerated the procedure well with no immediate periprocedural complications.  The patient was taken to the postanesthesia care unit in stable condition.          ______________________________ Alexis Frock, MD     TM/MEDQ  D:  12/01/2016  T:  12/02/2016  Job:  [id number]

## 2016-12-04 NOTE — Progress Notes (Signed)
Subjective:   1 - Nephrolithiasis - s/p bilateral ureteroscopy (first stage) for Rt > Lt voluem stones 12/01/16. Scheudled for 2nd stage surgery in about 2 weeks. CT on admit with small volume rt residual stone and good position stents as expected.  2 - Pyuria, Fevers - long h/o pyuria. UCX negatve x 3 in last month. [name], [place] 1/7 pending, now on empiric rocephin.  Today "Cindy [name]" is stable. Still c/o nausea and pain, but improved with hydration.   Objective: Vital signs in last 24 hours: Temp:  [98.8 F (37.1 C)-99.9 F (37.7 C)] 99.6 F (37.6 C) (01/08 0526) Pulse Rate:  [92-149] 92 (01/08 0540) Resp:  [16-20] 18 (01/08 0526) BP: (80-116)/(48-75) 98/60 (01/08 0540) SpO2:  [98 %-100 %] 100 % (01/08 0526) Weight:  [49.9 kg (110 lb)] 49.9 kg (110 lb) (01/07 2138) Last BM Date: 12/03/16  Intake/Output from previous day: 01/07 0701 - 01/08 0700 In: 1702.5 [P.O.:240; I.V.:362.5; IV Piggyback:1100] Out: -  Intake/Output this shift: No intake/output data recorded.  General appearance: alert, cooperative, appears stated age and at baseline.  Eyes: negative Nose: Nares normal. Septum midline. Mucosa normal. No drainage or sinus tenderness. Throat: lips, mucosa, and tongue normal; teeth and gums normal Neck: supple, symmetrical, trachea midline Back: symmetric, no curvature. ROM normal. No CVA tenderness. Cardio: Nl rate Pelvic: deferred Skin: Skin color, texture, turgor normal. No rashes or lesions Lymph nodes: Cervical, supraclavicular, and axillary nodes normal. Neurologic: Grossly normal  RQL iliosotomy patent. No abd distension. .   Lab Results:   Recent Labs  12/03/16 1612  WBC 8.2  HGB 10.0*  HCT 32.1*  PLT 282   BMET  Recent Labs  12/03/16 1612  NA 142  K 3.0*  CL 110  CO2 23  GLUCOSE 97  BUN 8  CREATININE 1.27*  CALCIUM 8.9   PT/INR No results for input(s): LABPROT, INR in the last 72 hours. ABG No results for input(s): PHART, HCO3 in the last  72 hours.  Invalid input(s): PCO2, PO2  Studies/Results: Ct Renal Stone Study  Result Date: 12/03/2016 CLINICAL DATA:  Abdominal and flank pain. Kidney stone removed 2 days ago. Vomiting since yesterday. History of colon cancer. EXAM: CT ABDOMEN AND PELVIS WITHOUT CONTRAST TECHNIQUE: Multidetector CT imaging of the abdomen and pelvis was performed following the standard protocol without IV contrast. COMPARISON:  November 21, 2016 FINDINGS: Lower chest: No acute abnormality. Hepatobiliary: Stones or sludge seen posteriorly in the gallbladder. Focal fatty deposition is seen adjacent to the falciform ligament. The liver is otherwise normal. Pancreas: Unremarkable. No pancreatic ductal dilatation or surrounding inflammatory changes. Spleen: Normal in size without focal abnormality. Adrenals/Urinary Tract: The adrenal glands are normal. Bilateral ureteral stents are identified. The 10 mm stone in the right renal pelvis on the previous study there is much smaller in the interval. A fragment of stone is likely still present measuring 5 by 4 mm today while the stone measured 10 x 10 mm previously. High attenuation foci are also seen in the lower pole of the right kidney, likely small stone fragments. No perinephric stranding on the right. The right renal pelvis is mildly thick walled today with no significant adjacent stranding. The right ureter contains a stent and is normal in appearance. No stones are seen along the course of the right ureter. There is a cyst in the left kidney. No definitive renal stones are seen in the left. The previously seen left-sided hydronephrosis has nearly resolved in the interval. There is still  stranding inferior to the left kidney which is stable. The stranding extends down the pericolic gutter and along the left ureter which is still prominent in caliber proximally. No stones seen along the course of the left ureteral stent. Air in the bladder is likely from recent instrumentation.  Ureteral stents and in the bladder. Stomach/Bowel: The stomach and small bowel are normal. The patient is status post partial colectomy with a right lower quadrant ostomy. Vascular/Lymphatic: No significant vascular findings are present. No enlarged abdominal or pelvic lymph nodes. Reproductive: There is an apparent mass just to the left of the superior aspect of the uterus, possibly the left ovary or fallopian tube measuring 2.3 x 5.4 cm. The uterus and right ovary is normal in appearance. Other: There is a fluid collection posterior to the rectum on series 2, image 73 measuring 3.9 x 2.0 cm today versus 3.9 x 1.6 cm previously, similar in the interval. There is fluid in the left side of the pelvis anteriorly which is increased in the interval. There is also fluid in the inferior right pelvis which has increased in the interval. This is best seen on coronal image 35. No free air. Musculoskeletal: No acute or significant osseous findings. IMPRESSION: 1. The 10 mm stone in the right renal pelvis is no longer visualized. Multiple small stones in the right kidney and right renal pelvis may represent fragments from the previous larger stone. The right renal pelvis is mildly thick walled today, new in the interval. This could be due to a interval treatment of the right renal stone potentially. No hydronephrosis on the right. No stones along the course of the right ureter. 2. No stones seen in the left kidney. The previously seen left-sided hydronephrosis is significantly improved. However, there still stranding in the fat inferior to the left kidney and along the prominent left ureter. The stranding along the left ureter has not improved at all in the interval. The cause for the ongoing stranding is not seen as no stones are seen along the course of the left ureter. 3. There is an apparent mass to the left of the uterus which may be ovarian/adnexal. This was not seen previously. Ultrasound of the pelvis could better  evaluate. 4. Increasing fluid in the pelvis bilaterally is nonspecific. It is possible this could be physiologic from a ruptured ovarian cyst in a patient of this age. However, it could be reactive from a renal etiology given the thickening of the right renal pelvis and the significant stranding around the left ureter and lower kidney. Urine cannot be completely excluded given interval instrumentation but there is no fluid around the right kidney or proximal ureter to suggest the fluid represents urine. Electronically Signed   By: Dorise Bullion III M.D   On: 12/03/2016 18:59    Anti-infectives: Anti-infectives    Start     Dose/Rate Route Frequency Ordered Stop   12/03/16 2300  cefTRIAXone (ROCEPHIN) 1 g in dextrose 5 % 50 mL IVPB     1 g 100 mL/hr over 30 Minutes Intravenous Daily at bedtime 12/03/16 2155     12/03/16 1730  aztreonam (AZACTAM) 1 g in dextrose 5 % 50 mL IVPB     1 g 100 mL/hr over 30 Minutes Intravenous  Once 12/03/16 1719 12/03/16 1849      Assessment/Plan:  1 - Nephrolithiasis - proceed with next stage surgery with goal of stone free in abotu 2 weeks, possibley sooner pending most recetn CX data.   2 -  Pyuria, Fevers - low suspicion for pyelo / severe GU infection. Agree with current ABX pending furhter data.   Please call me with questions.  [facility], Newman Waren 12/04/2016

## 2016-12-04 NOTE — Progress Notes (Signed)
PROGRESS NOTE    Cindy [name]  YQI:[id number] DOB: 18-Dec-1990 DOA: 12/03/2016 PCP: Pcp Not In System   Brief Narrative:  Cindy [name] is a 26 y.o. 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 The patient had a 10 mm right obstructing stone at the beginning of December, had bilateral ureteral stents placed On 12/6 here at [facility], required several rounds of Keflex/Bactrim in the interim. About  3 days ago, Dr. Tresa Moore performed with stone manipulation, left both stents in place, she was discharged the same day, not on antibiotics. She had bilateral flank pain and bilateral groin pain, both at rest and with voiding since then.  No hematuria, foul-urine, cloudy urine.  Today, had chills and vomited 4 times, and so came to the ER.  No cough, congestion, dyspnea, coryza.    Assessment & Plan:   Principal Problem:   Sepsis, unspecified organism ([place]) Active Problems:   Hypokalemia   Nausea with vomiting   FAP (familial adenomatous polyposis)   Nephrolithiasis   Normocytic anemia   CKD (chronic kidney disease), stage II  1. Sepsis 2/2 to UTI complicated by Nephrolithiasis s/p Bilateral Ureteroscopy Right > Left :  -Suspected source urinary. Organism unknown.  No respiratory symptoms, no focal signs on lung exam. -Per Admitting Physician Patient technically doesn't meet SIRS criteria but describes pyelonephritis symptoms and has evidence of organ dysfunction (elevated lactic acid, 2.36). -CT Scan of Abdomen showed The 10 mm stone in the right renal pelvis is no longer visualized. Multiple small stones in the right kidney and right renal pelvis may represent fragments from the previous larger stone. The right renal pelvis is mildly thick walled today, new in the interval. This could be due to a interval  treatment of the right renal stone potentially. No hydronephrosis on the right. No stones along the course of the right ureter. No stones seen in the left kidney. The previously seen left-sided hydronephrosis is significantly improved. However, there still stranding in the fat inferior to the left kidney and along the prominent left ureter. The stranding along the left ureter has not improved at all in the interval. The cause for the ongoing stranding is not seen as no stones are seen along the course of the left ureter.   Increasing fluid in the pelvis bilaterally is nonspecific. It is possible this could be physiologic from a ruptured ovarian cyst in a patient of this age. However, it could be reactive from a renal etiology given the thickening of the right renal pelvis and the significant stranding around the left ureter and lower kidney. Urine cannot be completely excluded given interval instrumentation but  there is no fluid around the right kidney or proximal ureter to suggest the fluid represents urine. -Sepsis bundle utilized on Admission and Blood and urine cultures drawn -1.5L bolus given in ED, will repeat lactic acid; C/w IVF at 125 mL/hr  -Start targeted antibiotics with ceftriaxone, based on suspected source of infection (was recently instrumented, but hasn't been on Abx since and last culture was Klebsiella only) -Urinalysis showed Few Bacteria, Large Leukocytes, Negative Nitrites and TNTC WBC  -Urine Cx Pending -Blood Cx x2 showed No Growth < 24 hours -Repeat renal function and complete blood count in AM -Urology is consulted, appreciate cares -Scheduled acetaminophen for pain, oxycodone PRN -Ondansetron for nausea  2. Chronic  Hypokalemia:  -K+ Level went from 3.0 -> 3.6 -Repeat CMP in AM  3. Hypomagnesemia -Patient's Mag level was 1.2 -Replete -Repeat Mag level in AM  4. Pelvic mass:  -CT Abd/Pelvis showed there is an apparent mass to the left of the uperior aspect of  the uterus, possibly the left ovary or fallopian tube measuring 2.3 x 5.4 cm. This was not seen previously. Ultrasound of the pelvis could better evaluate.  -Will follow up on Ultrasound pelvis ordered to better characterize  5. Anemia:  -Hb/Hct went from 10.0/32.1 -> 9.3/30.1 -Repeat CBC in AM  6. FAP s/p total colectomy an ileostomy -Stable.   DVT prophylaxis: SCD's Code Status: FULL CODE Family Communication: No family present at bedside Disposition Plan: Remain Inpatient and Home when medically stable  Consultants:   Urology   Procedures: None   Antimicrobials: IV Azactam 1 gram IV x 1 dose on 12/03/16; IV Ceftriaxone 12/03/16 ->  Subjective: Seen and examined and was still nauseous but not as much yesterday. Threw up this Am. No CP or SOB.   Objective: Vitals:   12/03/16 2138 12/04/16 0526 12/04/16 0540 12/04/16 1239  BP: 103/70 (!) 80/48 98/60 (!) 90/48  Pulse: 95 98 92 (!) 46  Resp: 19 18  18   Temp: 98.8 F (37.1 C) 99.6 F (37.6 C)  98.4 F (36.9 C)  TempSrc: Oral Oral  Oral  SpO2: 100% 100%  98%  Weight: 49.9 kg (110 lb)     Height: 5\' 1"  (1.549 m)       Intake/Output Summary (Last 24 hours) at 12/04/16 1508 Last data filed at 12/04/16 0100  Gross per 24 hour  Intake           1702.5 ml  Output                0 ml  Net           1702.5 ml   Filed Weights   12/03/16 2138  Weight: 49.9 kg (110 lb)    Examination: Physical Exam:  Constitutional: Thin NAD and appears calm and comfortable laying in bed Eyes: Lids and conjunctivae normal, sclerae anicteric  ENMT: External Ears, Nose appear normal. Grossly normal hearing.  Neck: Appears normal, supple, no cervical masses, normal ROM, no appreciable thyromegaly Respiratory: Clear to auscultation bilaterally, no wheezing, rales, rhonchi or crackles. Normal respiratory effort and patient is not tachypenic. No accessory muscle use.  Cardiovascular: RRR, no murmurs / rubs / gallops. S1 and S2 auscultated. No  extremity edema.  Abdomen: Soft, non-tender, non-distended. No masses palpated. No appreciable hepatosplenomegaly. Bowel sounds positive and ileostomy in place.  GU: Deferred. Musculoskeletal: No clubbing / cyanosis of digits/nails. No joint deformity upper and lower extremities. Skin: No rashes, lesions, ulcers. No induration; Warm and dry.  Neurologic: CN 2-12 grossly intact with no focal deficits. Sensation intact in all 4 Extremities. Romberg sign cerebellar reflexes not assessed.  Psychiatric: Normal judgment and insight. Alert and oriented x 3. Normal mood and appropriate affect.   Data Reviewed: I have personally reviewed following labs and imaging studies  CBC:  Recent Labs Lab 12/03/16 1612 12/04/16 0848  WBC 8.2 8.4  NEUTROABS  --  6.2  HGB 10.0* 9.3*  HCT 32.1* 30.1*  MCV 84.5 83.1  PLT 282 625   Basic Metabolic Panel:  Recent Labs Lab 12/03/16 1612 12/04/16 0848  NA 142 141  K 3.0* 3.6  CL 110 110  CO2 23 23  GLUCOSE 97 108*  BUN  8 8  CREATININE 1.27* 1.22*  CALCIUM 8.9 8.3*  MG 1.3* 1.2*  PHOS  --  2.9   GFR: Estimated Creatinine Clearance: 52.3 mL/min (by C-G formula based on SCr of 1.22 mg/dL (H)). Liver Function Tests:  Recent Labs Lab 12/03/16 1612 12/04/16 0848  AST 16 14*  ALT 11* 10*  ALKPHOS 59 61  BILITOT 0.8 0.4  PROT 7.0 6.2*  ALBUMIN 3.6 3.3*    Recent Labs Lab 12/03/16 1612  LIPASE 22   No results for input(s): AMMONIA in the last 168 hours. Coagulation Profile: No results for input(s): INR, PROTIME in the last 168 hours. Cardiac Enzymes: No results for input(s): CKTOTAL, CKMB, CKMBINDEX, TROPONINI in the last 168 hours. BNP (last 3 results) No results for input(s): PROBNP in the last 8760 hours. HbA1C: No results for input(s): HGBA1C in the last 72 hours. CBG: No results for input(s): GLUCAP in the last 168 hours. Lipid Profile: No results for input(s): CHOL, HDL, LDLCALC, TRIG, CHOLHDL, LDLDIRECT in the last 72  hours. Thyroid Function Tests: No results for input(s): TSH, T4TOTAL, FREET4, T3FREE, THYROIDAB in the last 72 hours. Anemia Panel: No results for input(s): VITAMINB12, FOLATE, FERRITIN, TIBC, IRON, RETICCTPCT in the last 72 hours. Sepsis Labs:  Recent Labs Lab 12/03/16 1710 12/03/16 2259  LATICACIDVEN 2.36* 1.9   No results found for this or any previous visit (from the past 240 hour(s)).   Radiology Studies: Ct Renal Stone Study  Result Date: 12/03/2016 CLINICAL DATA:  Abdominal and flank pain. Kidney stone removed 2 days ago. Vomiting since yesterday. History of colon cancer. EXAM: CT ABDOMEN AND PELVIS WITHOUT CONTRAST TECHNIQUE: Multidetector CT imaging of the abdomen and pelvis was performed following the standard protocol without IV contrast. COMPARISON:  November 21, 2016 FINDINGS: Lower chest: No acute abnormality. Hepatobiliary: Stones or sludge seen posteriorly in the gallbladder. Focal fatty deposition is seen adjacent to the falciform ligament. The liver is otherwise normal. Pancreas: Unremarkable. No pancreatic ductal dilatation or surrounding inflammatory changes. Spleen: Normal in size without focal abnormality. Adrenals/Urinary Tract: The adrenal glands are normal. Bilateral ureteral stents are identified. The 10 mm stone in the right renal pelvis on the previous study there is much smaller in the interval. A fragment of stone is likely still present measuring 5 by 4 mm today while the stone measured 10 x 10 mm previously. High attenuation foci are also seen in the lower pole of the right kidney, likely small stone fragments. No perinephric stranding on the right. The right renal pelvis is mildly thick walled today with no significant adjacent stranding. The right ureter contains a stent and is normal in appearance. No stones are seen along the course of the right ureter. There is a cyst in the left kidney. No definitive renal stones are seen in the left. The previously seen  left-sided hydronephrosis has nearly resolved in the interval. There is still stranding inferior to the left kidney which is stable. The stranding extends down the pericolic gutter and along the left ureter which is still prominent in caliber proximally. No stones seen along the course of the left ureteral stent. Air in the bladder is likely from recent instrumentation. Ureteral stents and in the bladder. Stomach/Bowel: The stomach and small bowel are normal. The patient is status post partial colectomy with a right lower quadrant ostomy. Vascular/Lymphatic: No significant vascular findings are present. No enlarged abdominal or pelvic lymph nodes. Reproductive: There is an apparent mass just to the left of the superior  aspect of the uterus, possibly the left ovary or fallopian tube measuring 2.3 x 5.4 cm. The uterus and right ovary is normal in appearance. Other: There is a fluid collection posterior to the rectum on series 2, image 73 measuring 3.9 x 2.0 cm today versus 3.9 x 1.6 cm previously, similar in the interval. There is fluid in the left side of the pelvis anteriorly which is increased in the interval. There is also fluid in the inferior right pelvis which has increased in the interval. This is best seen on coronal image 35. No free air. Musculoskeletal: No acute or significant osseous findings. IMPRESSION: 1. The 10 mm stone in the right renal pelvis is no longer visualized. Multiple small stones in the right kidney and right renal pelvis may represent fragments from the previous larger stone. The right renal pelvis is mildly thick walled today, new in the interval. This could be due to a interval treatment of the right renal stone potentially. No hydronephrosis on the right. No stones along the course of the right ureter. 2. No stones seen in the left kidney. The previously seen left-sided hydronephrosis is significantly improved. However, there still stranding in the fat inferior to the left kidney and  along the prominent left ureter. The stranding along the left ureter has not improved at all in the interval. The cause for the ongoing stranding is not seen as no stones are seen along the course of the left ureter. 3. There is an apparent mass to the left of the uterus which may be ovarian/adnexal. This was not seen previously. Ultrasound of the pelvis could better evaluate. 4. Increasing fluid in the pelvis bilaterally is nonspecific. It is possible this could be physiologic from a ruptured ovarian cyst in a patient of this age. However, it could be reactive from a renal etiology given the thickening of the right renal pelvis and the significant stranding around the left ureter and lower kidney. Urine cannot be completely excluded given interval instrumentation but there is no fluid around the right kidney or proximal ureter to suggest the fluid represents urine. Electronically Signed   By: Dorise Bullion III M.D   On: 12/03/2016 18:59   Scheduled Meds: . acetaminophen  1,000 mg Oral TID  . cefTRIAXone (ROCEPHIN)  IV  1 g Intravenous QHS   Continuous Infusions: . 0.9 % NaCl with KCl 20 mEq / L 125 mL (12/04/16 1105)     LOS: 1 day   Kerney Elbe, DO [facility] Pager [phone]  If 7PM-7AM, please contact night-coverage [email or web address] Password [email or web address] 12/04/2016, 3:08 PM

## 2016-12-05 ENCOUNTER — Inpatient Hospital Stay (HOSPITAL_COMMUNITY): Payer: Medicare Other

## 2016-12-05 DIAGNOSIS — R1900 Intra-abdominal and pelvic swelling, mass and lump, unspecified site: Secondary | ICD-10-CM

## 2016-12-05 LAB — CBC WITH DIFFERENTIAL/PLATELET
Basophils Absolute: 0 10*3/uL (ref 0.0–0.1)
Basophils Relative: 0 %
EOS PCT: 4 %
Eosinophils Absolute: 0.2 10*3/uL (ref 0.0–0.7)
HCT: 25.8 % — ABNORMAL LOW (ref 36.0–46.0)
Hemoglobin: 8.1 g/dL — ABNORMAL LOW (ref 12.0–15.0)
LYMPHS ABS: 1.2 10*3/uL (ref 0.7–4.0)
Lymphocytes Relative: 20 %
MCH: 26.6 pg (ref 26.0–34.0)
MCHC: 31.4 g/dL (ref 30.0–36.0)
MCV: 84.9 fL (ref 78.0–100.0)
MONO ABS: 0.8 10*3/uL (ref 0.1–1.0)
MONOS PCT: 13 %
Neutro Abs: 3.7 10*3/uL (ref 1.7–7.7)
Neutrophils Relative %: 63 %
PLATELETS: 197 10*3/uL (ref 150–400)
RBC: 3.04 MIL/uL — AB (ref 3.87–5.11)
RDW: 13 % (ref 11.5–15.5)
WBC: 5.9 10*3/uL (ref 4.0–10.5)

## 2016-12-05 LAB — COMPREHENSIVE METABOLIC PANEL
ALT: 9 U/L — AB (ref 14–54)
AST: 15 U/L (ref 15–41)
Albumin: 2.5 g/dL — ABNORMAL LOW (ref 3.5–5.0)
Alkaline Phosphatase: 52 U/L (ref 38–126)
Anion gap: 5 (ref 5–15)
BUN: 7 mg/dL (ref 6–20)
CALCIUM: 7.9 mg/dL — AB (ref 8.9–10.3)
CO2: 22 mmol/L (ref 22–32)
CREATININE: 0.98 mg/dL (ref 0.44–1.00)
Chloride: 112 mmol/L — ABNORMAL HIGH (ref 101–111)
Glucose, Bld: 97 mg/dL (ref 65–99)
Potassium: 3.8 mmol/L (ref 3.5–5.1)
Sodium: 139 mmol/L (ref 135–145)
Total Bilirubin: 0.5 mg/dL (ref 0.3–1.2)
Total Protein: 5.5 g/dL — ABNORMAL LOW (ref 6.5–8.1)

## 2016-12-05 LAB — MAGNESIUM: MAGNESIUM: 2.1 mg/dL (ref 1.7–2.4)

## 2016-12-05 LAB — FERRITIN: Ferritin: 31 ng/mL (ref 11–307)

## 2016-12-05 LAB — PHOSPHORUS: PHOSPHORUS: 3 mg/dL (ref 2.5–4.6)

## 2016-12-05 LAB — VITAMIN B12: Vitamin B-12: 320 pg/mL (ref 180–914)

## 2016-12-05 NOTE — Progress Notes (Signed)
PROGRESS NOTE    Cindy [name]  ONG:[id number] DOB: 1991/08/14 DOA: 12/03/2016 PCP: Pcp Not In System   Brief Narrative:  Cindy [name] is a 26 y.o. 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 The patient had a 10 mm right obstructing stone at the beginning of December, had bilateral ureteral stents placed On 12/6 here at [facility], required several rounds of Keflex/Bactrim in the interim. About  3 days ago, Dr. Tresa Moore performed with stone manipulation, left both stents in place, she was discharged the same day, not on antibiotics. She had bilateral flank pain and bilateral groin pain, both at rest and with voiding since then.  No hematuria, foul-urine, cloudy urine. On the day of Admission she had chills and vomited 4 times, and so came to the ER.  No cough, congestion, dyspnea, coryza. Working Diagnosis was Sepsis from UTI complicated by Bilateral Nephrolithiasis with stents.   Assessment & Plan:   Principal Problem:   Sepsis, unspecified organism ([place]) Active Problems:   Hypokalemia   Nausea with vomiting   FAP (familial adenomatous polyposis)   Nephrolithiasis   Normocytic anemia   CKD (chronic kidney disease), stage II  1. Sepsis 2/2 to UTI complicated by Nephrolithiasis s/p Bilateral Ureteroscopy Right > Left  With Stents -Sepsis Physiology is Resolving -Suspected source urinary organism unknown.  No respiratory symptoms, no focal signs on lung exam. -Per Admitting Physician Patient technically doesn't meet SIRS criteria but describes pyelonephritis symptoms and has evidence of organ dysfunction (elevated lactic acid, 2.36). -Lactic Acid Level went from 2.36 -CT Scan of Abdomen showed The 10 mm stone in the right renal pelvis is no longer visualized. Multiple small stones in the right  kidney and right renal pelvis may represent fragments from the previous larger stone. The right renal pelvis is mildly thick walled today, new in the interval. This could be due to a interval treatment of the right renal stone potentially. No hydronephrosis on the right. No stones along the course of the right ureter. No stones seen in the left kidney. The previously seen left-sided hydronephrosis is significantly improved. However, there still stranding in the fat inferior to the left kidney and along the prominent left ureter. The stranding along the left ureter has not improved at all in the interval. The cause for the ongoing stranding is not seen as no stones are seen along the course of the left ureter.   Increasing fluid in the pelvis bilaterally is nonspecific. It is possible this could be physiologic from a ruptured ovarian cyst in a patient of this age. However, it could be reactive from a renal etiology given the thickening of the right renal pelvis and the significant stranding around the left ureter and lower kidney. Urine cannot be completely excluded given interval instrumentation but  there is no fluid around the right kidney or proximal ureter to suggest the fluid represents urine. -Sepsis bundle utilized on Admission and Blood and urine cultures drawn -1.5L bolus given in ED, will repeat lactic acid; D/C'd IVF at 125 mL/hr as BP is improving  -Start targeted antibiotics with Ceftriaxone, based on suspected source of infection (was recently instrumented, but hasn't been on Abx since and last culture was Klebsiella only) -Urinalysis showed Few Bacteria, Large Leukocytes, Negative Nitrites and TNTC WBC  -Urine Cx Pending; Was Reincubated at [facility] for  better growth -Blood Cx x2 showed No Growth at 1 day -Repeat renal function and complete blood count in AM -Urology is consulted and Appreciate Recc's  -Scheduled acetaminophen for pain, oxycodone PRN -Ondansetron for nausea and Pain control  with Oxycode IR q4hprn and Dilaudid 1 mg IV q3hprn  2. Chronic Hypokalemia:  -K+ Level went from 3.0 -> 3.6 -> 3.8 -Repeat CMP in AM  3. Hypomagnesemia, improved -Patient's Mag level was 1.2 and went to 2.1 -Replete as necessary  -Repeat Mag level in AM  4. Pelvic mass:  -CT Abd/Pelvis showed there is an apparent mass to the left of the uperior aspect of the uterus, possibly the left ovary or fallopian tube measuring 2.3 x 5.4 cm. This was not seen previously.   -Ultrasound of the Pelvis and Transvaginal U/S show no Adnexal Mass and Moderate Amount of Pelvic Free Fluid -Will need to Follow   5. Anemia:  -Hb/Hct went from 10.0/32.1 -> 9.3/30.1 -> 8.1/25.8 -Repeat CBC in AM  6. FAP s/p total colectomy an ileostomy -Stable.   7. Acute Kidney Injury, Improving -Patient's BUN/Cr went from 8/1.27 -> 7/0.98 after IVF Rehydration -Repeat CMP in AM  DVT prophylaxis: SCD's Code Status: FULL CODE Family Communication: No family present at bedside Disposition Plan: Likely Home in AM if Medically Stable and results of Urine Cx are back.   Consultants:   Urology   Procedures: Transvaginal and Pelvic Ultrasound   Antimicrobials: IV Azactam 1 gram IV x 1 dose on 12/03/16; IV Ceftriaxone 12/03/16 ->  Subjective: Seen and examined and was feeling much better today. Didn't like the food that she received. No nausea or vomiting today. Had no other complaints or concerns at this time.  Objective: Vitals:   12/04/16 1239 12/04/16 2033 12/05/16 0449 12/05/16 1359  BP: (!) 90/48 100/60 109/62 105/66  Pulse: (!) 46 88 96 85  Resp: 18 18 16 16   Temp: 98.4 F (36.9 C) 98.9 F (37.2 C) 98 F (36.7 C) 98.3 F (36.8 C)  TempSrc: Oral Oral Oral Oral  SpO2: 98% 99% 98% 100%  Weight:      Height:        Intake/Output Summary (Last 24 hours) at 12/05/16 1950 Last data filed at 12/05/16 1500  Gross per 24 hour  Intake              720 ml  Output              650 ml  Net                70 ml   Filed Weights   12/03/16 2138  Weight: 49.9 kg (110 lb)    Examination: Physical Exam:  Constitutional: Thin NAD and appears calm and comfortable laying in bed Eyes: Lids and conjunctivae normal, sclerae anicteric  ENMT: External Ears, Nose appear normal. Grossly normal hearing.  Neck: Appears normal, supple, no cervical masses, normal ROM, no appreciable thyromegaly Respiratory: Clear to auscultation bilaterally, no wheezing, rales, rhonchi or crackles. Normal respiratory effort and patient is not tachypenic. No accessory muscle use.  Cardiovascular: RRR, no murmurs / rubs / gallops. S1 and S2 auscultated. No extremity edema.  Abdomen: Soft, non-tender, non-distended. No masses palpated. No appreciable hepatosplenomegaly. Bowel sounds positive x4 and ileostomy in place.  GU: Deferred. Musculoskeletal: No clubbing / cyanosis of digits/nails. No joint deformity upper and lower extremities. Skin: No rashes, lesions, ulcers. No induration; Warm and dry.  Neurologic: CN 2-12 grossly intact with no focal  deficits. Sensation intact in all 4 Extremities. Romberg sign cerebellar reflexes not assessed.  Psychiatric: Normal judgment and insight. Alert and oriented x 3. Normal mood and appropriate affect.   Data Reviewed: I have personally reviewed following labs and imaging studies  CBC:  Recent Labs Lab 12/03/16 1612 12/04/16 0848 12/04/16 2037 12/05/16 0430  WBC 8.2 8.4 5.8 5.9  NEUTROABS  --  6.2  --  3.7  HGB 10.0* 9.3* 8.3* 8.1*  HCT 32.1* 30.1* 26.0* 25.8*  MCV 84.5 83.1 84.7 84.9  PLT 282 225 195 712   Basic Metabolic Panel:  Recent Labs Lab 12/03/16 1612 12/04/16 0848 12/04/16 2037 12/05/16 0430  NA 142 141 140 139  K 3.0* 3.6 3.7 3.8  CL 110 110 113* 112*  CO2 23 23 22 22   GLUCOSE 97 108* 97 97  BUN 8 8 8 7   CREATININE 1.27* 1.22* 1.06* 0.98  CALCIUM 8.9 8.3* 7.7* 7.9*  MG 1.3* 1.2*  --  2.1  PHOS  --  2.9  --  3.0   GFR: Estimated Creatinine  Clearance: 65.1 mL/min (by C-G formula based on SCr of 0.98 mg/dL). Liver Function Tests:  Recent Labs Lab 12/03/16 1612 12/04/16 0848 12/05/16 0430  AST 16 14* 15  ALT 11* 10* 9*  ALKPHOS 59 61 52  BILITOT 0.8 0.4 0.5  PROT 7.0 6.2* 5.5*  ALBUMIN 3.6 3.3* 2.5*    Recent Labs Lab 12/03/16 1612  LIPASE 22   No results for input(s): AMMONIA in the last 168 hours. Coagulation Profile: No results for input(s): INR, PROTIME in the last 168 hours. Cardiac Enzymes: No results for input(s): CKTOTAL, CKMB, CKMBINDEX, TROPONINI in the last 168 hours. BNP (last 3 results) No results for input(s): PROBNP in the last 8760 hours. HbA1C: No results for input(s): HGBA1C in the last 72 hours. CBG: No results for input(s): GLUCAP in the last 168 hours. Lipid Profile: No results for input(s): CHOL, HDL, LDLCALC, TRIG, CHOLHDL, LDLDIRECT in the last 72 hours. Thyroid Function Tests: No results for input(s): TSH, T4TOTAL, FREET4, T3FREE, THYROIDAB in the last 72 hours. Anemia Panel:  Recent Labs  12/04/16 2037  VITAMINB12 320  FERRITIN 31   Sepsis Labs:  Recent Labs Lab 12/03/16 1710 12/03/16 2259  LATICACIDVEN 2.36* 1.9   Recent Results (from the past 240 hour(s))  Culture, blood (routine x 2)     Status: None (Preliminary result)   Collection Time: 12/03/16  5:30 PM  Result Value Ref Range Status   Specimen Description BLOOD LEFT HAND  Final   Special Requests BOTTLES DRAWN AEROBIC AND ANAEROBIC 5CC  Final   Culture   Final    NO GROWTH 2 DAYS Performed at [facility]    Report Status PENDING  Incomplete  Culture, Urine     Status: None (Preliminary result)   Collection Time: 12/03/16  6:25 PM  Result Value Ref Range Status   Specimen Description URINE, CLEAN CATCH  Final   Special Requests NONE  Final   Culture   Final    CULTURE REINCUBATED FOR BETTER GROWTH Performed at [facility]    Report Status PENDING  Incomplete  Culture, blood (routine x  2)     Status: None (Preliminary result)   Collection Time: 12/03/16 10:59 PM  Result Value Ref Range Status   Specimen Description BLOOD LEFT ANTECUBITAL  Final   Special Requests IN PEDIATRIC BOTTLE 3 CC  Final   Culture   Final  NO GROWTH 1 DAY Performed at [facility]    Report Status PENDING  Incomplete     Radiology Studies: US Transvaginal Non-ob  Result Date: 12/05/2016 CLINICAL DATA:  Adnexal mass.  LMP 11/16/2016 EXAM: TRANSABDOMINAL AND TRANSVAGINAL ULTRASOUND OF PELVIS TECHNIQUE: Both transabdominal and transvaginal ultrasound examinations of the pelvis were performed. Transabdominal technique was performed for global imaging of the pelvis including uterus, ovaries, adnexal regions, and pelvic cul-de-sac. It was necessary to proceed with endovaginal exam following the transabdominal exam to visualize the endometrium and ovaries. COMPARISON:  None FINDINGS: Uterus Measurements: 8.5 x 4.1 x 4.4 cm. No fibroids or other mass visualized. Endometrium Thickness: 10.3 mm.  No focal abnormality visualized. Right ovary Measurements: 5 x 1.8 x 2.4 cm. Normal appearance/no adnexal mass. Left ovary Measurements: 6.2 x 3.9 x 3.6 cm. Normal appearance/no adnexal mass. Multiple left ovarian follicles with the largest measuring 2.7 cm. Other findings Moderate amount of pelvic free fluid. IMPRESSION: 1. No adnexal mass. 2. Moderate amount of pelvic free fluid. Electronically Signed   By: Kathreen Devoid   On: 12/05/2016 12:29   US Pelvis Complete  Result Date: 12/05/2016 CLINICAL DATA:  Adnexal mass.  LMP 11/16/2016 EXAM: TRANSABDOMINAL AND TRANSVAGINAL ULTRASOUND OF PELVIS TECHNIQUE: Both transabdominal and transvaginal ultrasound examinations of the pelvis were performed. Transabdominal technique was performed for global imaging of the pelvis including uterus, ovaries, adnexal regions, and pelvic cul-de-sac. It was necessary to proceed with endovaginal exam following the transabdominal exam to  visualize the endometrium and ovaries. COMPARISON:  None FINDINGS: Uterus Measurements: 8.5 x 4.1 x 4.4 cm. No fibroids or other mass visualized. Endometrium Thickness: 10.3 mm.  No focal abnormality visualized. Right ovary Measurements: 5 x 1.8 x 2.4 cm. Normal appearance/no adnexal mass. Left ovary Measurements: 6.2 x 3.9 x 3.6 cm. Normal appearance/no adnexal mass. Multiple left ovarian follicles with the largest measuring 2.7 cm. Other findings Moderate amount of pelvic free fluid. IMPRESSION: 1. No adnexal mass. 2. Moderate amount of pelvic free fluid. Electronically Signed   By: Kathreen Devoid   On: 12/05/2016 12:29   Scheduled Meds: . acetaminophen  1,000 mg Oral TID  . cefTRIAXone (ROCEPHIN)  IV  1 g Intravenous QHS  . citalopram  10 mg Oral QHS  . [START ON 12/08/2016] cyanocobalamin  1,000 mcg Intramuscular Q30 days  . enoxaparin (LOVENOX) injection  40 mg Subcutaneous Q24H  . magnesium oxide  400 mg Oral Daily  . potassium chloride  26.6667 mEq Oral QID   Continuous Infusions: . 0.9 % NaCl with KCl 20 mEq / L 125 mL/hr at 12/05/16 1832     LOS: 2 days   Kerney Elbe, DO [facility] Pager [phone]  If 7PM-7AM, please contact night-coverage [email or web address] Password [email or web address] 12/05/2016, 7:50 PM

## 2016-12-05 NOTE — Progress Notes (Signed)
Subjective:  1 - Nephrolithiasis - s/p bilateral ureteroscopy (first stage) for Rt > Lt volume stones 12/01/16. Scheudled for 2nd stage surgery in about 2 weeks. CT on admit with small volume rt residual stone and good position stents as expected.  2 - Pyuria, Fevers - long h/o pyuria. UCX negatve x 3 in last month. Cindy [name], [place] 1/7 pending and no growth to date, now on empiric rocephin and afebrile x 24 hrs.   Today "Cindy [name]" feels improved. Afebrile x 24 hrs, less nausea. Feels close to baseline.   Objective: Vital signs in last 24 hours: Temp:  [98 F (36.7 C)-98.9 F (37.2 C)] 98 F (36.7 C) (01/09 0449) Pulse Rate:  [46-96] 96 (01/09 0449) Resp:  [16-18] 16 (01/09 0449) BP: (90-109)/(48-62) 109/62 (01/09 0449) SpO2:  [98 %-99 %] 98 % (01/09 0449) Last BM Date: 12/05/16  Intake/Output from previous day: 01/08 0701 - 01/09 0700 In: 360 [P.O.:360] Out: 650 [Urine:650] Intake/Output this shift: No intake/output data recorded.  General appearance: alert, cooperative and at baseline.  Eyes: negative Nose: Nares normal. Septum midline. Mucosa normal. No drainage or sinus tenderness. Throat: lips, mucosa, and tongue normal; teeth and gums normal Back: symmetric, no curvature. ROM normal. No CVA tenderness. Resp: non-labored on room air.  Cardio: Nl rate GI: soft, non-tender; bowel sounds normal; no masses,  no organomegaly and RLQ end iliostomy patent of stool and gas.  Extremities: extremities normal, atraumatic, no cyanosis or edema Pulses: 2+ and symmetric Skin: Skin color, texture, turgor normal. No rashes or lesions Neurologic: Grossly normal  Lab Results:   Recent Labs  12/04/16 2037 12/05/16 0430  WBC 5.8 5.9  HGB 8.3* 8.1*  HCT 26.0* 25.8*  PLT 195 197   BMET  Recent Labs  12/04/16 2037 12/05/16 0430  NA 140 139  K 3.7 3.8  CL 113* 112*  CO2 22 22  GLUCOSE 97 97  BUN 8 7  CREATININE 1.06* 0.98  CALCIUM 7.7* 7.9*   PT/INR No results for  input(s): LABPROT, INR in the last 72 hours. ABG No results for input(s): PHART, HCO3 in the last 72 hours.  Invalid input(s): PCO2, PO2  Studies/Results: Ct Renal Stone Study  Result Date: 12/03/2016 CLINICAL DATA:  Abdominal and flank pain. Kidney stone removed 2 days ago. Vomiting since yesterday. History of colon cancer. EXAM: CT ABDOMEN AND PELVIS WITHOUT CONTRAST TECHNIQUE: Multidetector CT imaging of the abdomen and pelvis was performed following the standard protocol without IV contrast. COMPARISON:  November 21, 2016 FINDINGS: Lower chest: No acute abnormality. Hepatobiliary: Stones or sludge seen posteriorly in the gallbladder. Focal fatty deposition is seen adjacent to the falciform ligament. The liver is otherwise normal. Pancreas: Unremarkable. No pancreatic ductal dilatation or surrounding inflammatory changes. Spleen: Normal in size without focal abnormality. Adrenals/Urinary Tract: The adrenal glands are normal. Bilateral ureteral stents are identified. The 10 mm stone in the right renal pelvis on the previous study there is much smaller in the interval. A fragment of stone is likely still present measuring 5 by 4 mm today while the stone measured 10 x 10 mm previously. High attenuation foci are also seen in the lower pole of the right kidney, likely small stone fragments. No perinephric stranding on the right. The right renal pelvis is mildly thick walled today with no significant adjacent stranding. The right ureter contains a stent and is normal in appearance. No stones are seen along the course of the right ureter. There is a cyst in the left  kidney. No definitive renal stones are seen in the left. The previously seen left-sided hydronephrosis has nearly resolved in the interval. There is still stranding inferior to the left kidney which is stable. The stranding extends down the pericolic gutter and along the left ureter which is still prominent in caliber proximally. No stones seen along  the course of the left ureteral stent. Air in the bladder is likely from recent instrumentation. Ureteral stents and in the bladder. Stomach/Bowel: The stomach and small bowel are normal. The patient is status post partial colectomy with a right lower quadrant ostomy. Vascular/Lymphatic: No significant vascular findings are present. No enlarged abdominal or pelvic lymph nodes. Reproductive: There is an apparent mass just to the left of the superior aspect of the uterus, possibly the left ovary or fallopian tube measuring 2.3 x 5.4 cm. The uterus and right ovary is normal in appearance. Other: There is a fluid collection posterior to the rectum on series 2, image 73 measuring 3.9 x 2.0 cm today versus 3.9 x 1.6 cm previously, similar in the interval. There is fluid in the left side of the pelvis anteriorly which is increased in the interval. There is also fluid in the inferior right pelvis which has increased in the interval. This is best seen on coronal image 35. No free air. Musculoskeletal: No acute or significant osseous findings. IMPRESSION: 1. The 10 mm stone in the right renal pelvis is no longer visualized. Multiple small stones in the right kidney and right renal pelvis may represent fragments from the previous larger stone. The right renal pelvis is mildly thick walled today, new in the interval. This could be due to a interval treatment of the right renal stone potentially. No hydronephrosis on the right. No stones along the course of the right ureter. 2. No stones seen in the left kidney. The previously seen left-sided hydronephrosis is significantly improved. However, there still stranding in the fat inferior to the left kidney and along the prominent left ureter. The stranding along the left ureter has not improved at all in the interval. The cause for the ongoing stranding is not seen as no stones are seen along the course of the left ureter. 3. There is an apparent mass to the left of the uterus which  may be ovarian/adnexal. This was not seen previously. Ultrasound of the pelvis could better evaluate. 4. Increasing fluid in the pelvis bilaterally is nonspecific. It is possible this could be physiologic from a ruptured ovarian cyst in a patient of this age. However, it could be reactive from a renal etiology given the thickening of the right renal pelvis and the significant stranding around the left ureter and lower kidney. Urine cannot be completely excluded given interval instrumentation but there is no fluid around the right kidney or proximal ureter to suggest the fluid represents urine. Electronically Signed   By: Dorise Bullion III M.D   On: 12/03/2016 18:59    Anti-infectives: Anti-infectives    Start     Dose/Rate Route Frequency Ordered Stop   12/03/16 2300  cefTRIAXone (ROCEPHIN) 1 g in dextrose 5 % 50 mL IVPB     1 g 100 mL/hr over 30 Minutes Intravenous Daily at bedtime 12/03/16 2155     12/03/16 1730  aztreonam (AZACTAM) 1 g in dextrose 5 % 50 mL IVPB     1 g 100 mL/hr over 30 Minutes Intravenous  Once 12/03/16 1719 12/03/16 1849      Assessment/Plan:  1 - Nephrolithiasis -  proceed with next stage surgery with goal of stone free in abotu 2 weeks, unfortunately I do not feel scheduling will allow to proceed with sooner.    2 - Pyuria, Fevers - low suspicion for pyelo / severe GU infection.  She is now afebrile x 24 hrs and BCX remain now growth. I feel pt stable for transition to outpatient care at anypoint from GU perspective.   [facility], Cindy [name] 12/05/2016

## 2016-12-06 DIAGNOSIS — A419 Sepsis, unspecified organism: Principal | ICD-10-CM

## 2016-12-06 DIAGNOSIS — N182 Chronic kidney disease, stage 2 (mild): Secondary | ICD-10-CM

## 2016-12-06 DIAGNOSIS — D649 Anemia, unspecified: Secondary | ICD-10-CM

## 2016-12-06 DIAGNOSIS — R112 Nausea with vomiting, unspecified: Secondary | ICD-10-CM

## 2016-12-06 DIAGNOSIS — D126 Benign neoplasm of colon, unspecified: Secondary | ICD-10-CM

## 2016-12-06 DIAGNOSIS — N200 Calculus of kidney: Secondary | ICD-10-CM

## 2016-12-06 LAB — CBC WITH DIFFERENTIAL/PLATELET
Basophils Absolute: 0 10*3/uL (ref 0.0–0.1)
Basophils Relative: 0 %
EOS ABS: 0.4 10*3/uL (ref 0.0–0.7)
EOS PCT: 7 %
HCT: 26 % — ABNORMAL LOW (ref 36.0–46.0)
Hemoglobin: 8.3 g/dL — ABNORMAL LOW (ref 12.0–15.0)
LYMPHS PCT: 22 %
Lymphs Abs: 1.4 10*3/uL (ref 0.7–4.0)
MCH: 26.6 pg (ref 26.0–34.0)
MCHC: 31.9 g/dL (ref 30.0–36.0)
MCV: 83.3 fL (ref 78.0–100.0)
MONO ABS: 0.6 10*3/uL (ref 0.1–1.0)
Monocytes Relative: 9 %
Neutro Abs: 4.1 10*3/uL (ref 1.7–7.7)
Neutrophils Relative %: 62 %
PLATELETS: 240 10*3/uL (ref 150–400)
RBC: 3.12 MIL/uL — AB (ref 3.87–5.11)
RDW: 12.9 % (ref 11.5–15.5)
WBC: 6.5 10*3/uL (ref 4.0–10.5)

## 2016-12-06 LAB — COMPREHENSIVE METABOLIC PANEL
ALT: 8 U/L — ABNORMAL LOW (ref 14–54)
ANION GAP: 5 (ref 5–15)
AST: 13 U/L — ABNORMAL LOW (ref 15–41)
Albumin: 2.6 g/dL — ABNORMAL LOW (ref 3.5–5.0)
Alkaline Phosphatase: 56 U/L (ref 38–126)
BUN: <5 mg/dL — ABNORMAL LOW (ref 6–20)
CO2: 22 mmol/L (ref 22–32)
Calcium: 8.2 mg/dL — ABNORMAL LOW (ref 8.9–10.3)
Chloride: 112 mmol/L — ABNORMAL HIGH (ref 101–111)
Creatinine, Ser: 1.05 mg/dL — ABNORMAL HIGH (ref 0.44–1.00)
Glucose, Bld: 99 mg/dL (ref 65–99)
POTASSIUM: 3.4 mmol/L — AB (ref 3.5–5.1)
SODIUM: 139 mmol/L (ref 135–145)
Total Bilirubin: 0.4 mg/dL (ref 0.3–1.2)
Total Protein: 5.3 g/dL — ABNORMAL LOW (ref 6.5–8.1)

## 2016-12-06 LAB — PHOSPHORUS: PHOSPHORUS: 3.1 mg/dL (ref 2.5–4.6)

## 2016-12-06 LAB — MAGNESIUM: MAGNESIUM: 1.6 mg/dL — AB (ref 1.7–2.4)

## 2016-12-06 MED ORDER — NITROFURANTOIN MONOHYD MACRO 100 MG PO CAPS: 100.0000 mg | ORAL_CAPSULE | Freq: Two times a day (BID) | ORAL | 0 refills | Status: AC

## 2016-12-06 MED ORDER — FLUCONAZOLE 100 MG PO TABS
150.0000 mg | ORAL_TABLET | Freq: Every day | ORAL | Status: DC
  Administered 2016-12-06: 150 mg via ORAL
  Filled 2016-12-06: qty 2

## 2016-12-06 NOTE — Discharge Instructions (Signed)
Cindy [name], you came in with symptoms concerning for a bad urinary infection. This was concerning especially since you recently had surgery performed for your kidney stones. You were given antibiotics and a urine culture shows some bacteria. You will be discharged with a 7 day course of Nitrofurantoin to help treat this infection. Please follow-up with the urologist next week for your second surgery.

## 2016-12-06 NOTE — Progress Notes (Signed)
Subjective:  1 - Nephrolithiasis - s/p bilateral ureteroscopy (first stage) for Rt > Lt volume stones 12/01/16. Scheudled for 2nd stage surgery in about 2 weeks. CT on admit with small volume rt residual stone and good position stents as expected.  2 - Pyuria, Fevers - long h/o pyuria. UCX negatve x 3 in last month. New UCX 1/7 negative , BCX 1/7 pending and no growth to date, now on empiric rocephin and afebrile x 48 hrs.  Today "[place]" is stable. Pelvic US yesterday most c/w luteal cyst on ovary.   Objective: Vital signs in last 24 hours: Temp:  [98.3 F (36.8 C)-98.8 F (37.1 C)] 98.8 F (37.1 C) (01/09 2214) Pulse Rate:  [85-100] 100 (01/09 2214) Resp:  [16] 16 (01/09 2214) BP: (105-111)/(65-66) 111/65 (01/09 2214) SpO2:  [100 %] 100 % (01/09 2214) Last BM Date: 12/05/16  Intake/Output from previous day: 01/09 0701 - 01/10 0700 In: 360 [P.O.:360] Out: -  Intake/Output this shift: No intake/output data recorded.  General appearance: alert, cooperative, appears stated age and at baseline Eyes: negative Nose: Nares normal. Septum midline. Mucosa normal. No drainage or sinus tenderness. Throat: lips, mucosa, and tongue normal; teeth and gums normal Neck: supple, symmetrical, trachea midline Back: symmetric, no curvature. ROM normal. No CVA tenderness. Resp: non-labored on room air.  Cardio: midl regular tachycardia, stable.  GI: soft, non-tender; bowel sounds normal; no masses,  no organomegaly and . Extremities: soft, non-tender; bowel sounds normal; no masses,  no organomegaly and RLQ end iliostomy patent into apliance.  Lymph nodes: Cervical, supraclavicular, and axillary nodes normal. Neurologic: Grossly normal  Lab Results:   Recent Labs  12/05/16 0430 12/06/16 0445  WBC 5.9 6.5  HGB 8.1* 8.3*  HCT 25.8* 26.0*  PLT 197 240   BMET  Recent Labs  12/05/16 0430 12/06/16 0445  NA 139 139  K 3.8 3.4*  CL 112* 112*  CO2 22 22  GLUCOSE 97 99  BUN 7 <5*   CREATININE 0.98 1.05*  CALCIUM 7.9* 8.2*   PT/INR No results for input(s): LABPROT, INR in the last 72 hours. ABG No results for input(s): PHART, HCO3 in the last 72 hours.  Invalid input(s): PCO2, PO2  Studies/Results: US Transvaginal Non-ob  Result Date: 12/05/2016 CLINICAL DATA:  Adnexal mass.  LMP 11/16/2016 EXAM: TRANSABDOMINAL AND TRANSVAGINAL ULTRASOUND OF PELVIS TECHNIQUE: Both transabdominal and transvaginal ultrasound examinations of the pelvis were performed. Transabdominal technique was performed for global imaging of the pelvis including uterus, ovaries, adnexal regions, and pelvic cul-de-sac. It was necessary to proceed with endovaginal exam following the transabdominal exam to visualize the endometrium and ovaries. COMPARISON:  None FINDINGS: Uterus Measurements: 8.5 x 4.1 x 4.4 cm. No fibroids or other mass visualized. Endometrium Thickness: 10.3 mm.  No focal abnormality visualized. Right ovary Measurements: 5 x 1.8 x 2.4 cm. Normal appearance/no adnexal mass. Left ovary Measurements: 6.2 x 3.9 x 3.6 cm. Normal appearance/no adnexal mass. Multiple left ovarian follicles with the largest measuring 2.7 cm. Other findings Moderate amount of pelvic free fluid. IMPRESSION: 1. No adnexal mass. 2. Moderate amount of pelvic free fluid. Electronically Signed   By: Kathreen Devoid   On: 12/05/2016 12:29   US Pelvis Complete  Result Date: 12/05/2016 CLINICAL DATA:  Adnexal mass.  LMP 11/16/2016 EXAM: TRANSABDOMINAL AND TRANSVAGINAL ULTRASOUND OF PELVIS TECHNIQUE: Both transabdominal and transvaginal ultrasound examinations of the pelvis were performed. Transabdominal technique was performed for global imaging of the pelvis including uterus, ovaries, adnexal regions, and pelvic cul-de-sac.  It was necessary to proceed with endovaginal exam following the transabdominal exam to visualize the endometrium and ovaries. COMPARISON:  None FINDINGS: Uterus Measurements: 8.5 x 4.1 x 4.4 cm. No fibroids or  other mass visualized. Endometrium Thickness: 10.3 mm.  No focal abnormality visualized. Right ovary Measurements: 5 x 1.8 x 2.4 cm. Normal appearance/no adnexal mass. Left ovary Measurements: 6.2 x 3.9 x 3.6 cm. Normal appearance/no adnexal mass. Multiple left ovarian follicles with the largest measuring 2.7 cm. Other findings Moderate amount of pelvic free fluid. IMPRESSION: 1. No adnexal mass. 2. Moderate amount of pelvic free fluid. Electronically Signed   By: Kathreen Devoid   On: 12/05/2016 12:29    Anti-infectives: Anti-infectives    Start     Dose/Rate Route Frequency Ordered Stop   12/03/16 2300  cefTRIAXone (ROCEPHIN) 1 g in dextrose 5 % 50 mL IVPB     1 g 100 mL/hr over 30 Minutes Intravenous Daily at bedtime 12/03/16 2155     12/03/16 1730  aztreonam (AZACTAM) 1 g in dextrose 5 % 50 mL IVPB     1 g 100 mL/hr over 30 Minutes Intravenous  Once 12/03/16 1719 12/03/16 1849      Assessment/Plan:   I feel pt OK for DC home at anypoint w/o additional antimicrobials. She has GU follow up arranged for next stage outpatient stone surgery.    Greatly appreciate hospitalist comangament.    [facility] [place], Jag Lenz 12/06/2016

## 2016-12-06 NOTE — Discharge Summary (Signed)
Physician Discharge Summary  Cindy [name] KCL:[id number] DOB: 02/27/1991 DOA: 12/03/2016  PCP: Winfred Burn, NP  Admit date: 12/03/2016 Discharge date: 12/06/2016  Admitted From: Home Disposition:  Home  Recommendations for Outpatient Follow-up:  1. Follow up with PCP in 1-2 weeks 2. Gyn follow-up for pelvic mass 3. Repeat BMP with magnesium to recheck electrolytes and creatinine in 2-3 days 4. Follow-up with urology next week   Discharge Condition:Stable CODE STATUS: Full code Diet recommendation: Regular diet   Brief/Interim Summary: Cindy [name] a 26 y.o.femalewith a past medical history significant for FAP c/b colon CA s/p total colectomy and ileostomy, recurrent nephrolithiasis and recent stent placementwho presents with chills, vomiting and flank pain, urinary irritative symptoms. The patient had a 10 mm right obstructing stone at the beginning of December, had bilateral ureteral stents placed On 12/6 here at [facility], required several rounds of Keflex/Bactrim in the interim. About  3 days ago, Dr. Tresa Moore performed with stone manipulation, left both stents in place, she was discharged the same day, not on antibiotics. She had bilateral flank pain and bilateral groin pain, both at rest and with voiding since then. No hematuria, foul-urine, cloudy urine. On the day of Admission she had chills and vomited 4 times, and so came to the ER. No cough, congestion, dyspnea, coryza. Working Diagnosis was Sepsis from UTI complicated by Bilateral Nephrolithiasis with stents.    Hospital course:  HPI written by Myrene Buddy, MD  on 12/03/2016  HPI: Cindy [name] is a 26 y.o. 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  The patient had a 10 mm right obstructing stone at the beginning  of December, had bilateral ureteral stents placed. On 12/6 here at [facility], required several rounds of Keflex/Bactrim in the interim. 3 days ago, Dr. Tresa Moore performed with stone manipulation, left both stents in place, she was discharged the same day, not on antibiotics.  She had bilateral flank pain and bilateral groin pain, both at rest and with voiding since then.  No hematuria, foul-urine, cloudy urine.  Today, had chills and vomited 4 times, and so came to the ER.  No cough, congestion, dyspnea, coryza.     Sepsis Secondary to UTI. Complicated by recent bilateral uretoscopy and stent placement for nephrolithiasis. Patient was treated with IV fluid and empiric aztreonam and ceftriaxone started. No hydronephrosis.  UTI Aztreonam and ceftriaxonestarted empirically. Urine culture significant for enterococcus and antibiotics narrowed to nitrofurantoin on discharge.  Hypokalemia Chronic. Continued home potassium.  Hypomagnesemia Continued magnesium oxide  Pelvic mass Mass to the left of superior aspect of the uterus not seen previously. Will need gyn follow-up  Anemia Hemoglobin low but around baseline. No evidence of bleeding.  Acute kidney injury Secondary to dehydration. Improved with IV fluids.  Discharge Diagnoses:  Principal Problem:   Sepsis, unspecified organism [facility]) Active Problems:   Hypokalemia   Nausea with vomiting   FAP (familial adenomatous polyposis)   Nephrolithiasis   Normocytic anemia   CKD (chronic kidney disease), stage II    Discharge Instructions   Allergies as of 12/06/2016      Reactions   Venofer [ferric Oxide] Anaphylaxis   Soap Hives, Itching, Other (See Comments)    allergic to [employer].     Food Hives, Other (See Comments)    allergic to orange juice.  Morphine And Related Swelling   When given IV, swelling around site   Sulfamethoxazole-trimethoprim Nausea And Vomiting   Patient developed AKI that appears to have been from  bactrim. She might be able to take again if watched closely and it is absolutely needed - ie there was no anaphylaxis or allergic rash   Iron Palpitations   Levaquin [levofloxacin] Hives      Medication List    STOP taking these medications   scopolamine 1 MG/3DAYS Commonly known as:  TRANSDERM-SCOP     TAKE these medications   citalopram 10 MG tablet Commonly known as:  CELEXA Take 10 mg by mouth at bedtime.   cyanocobalamin 1000 MCG/ML injection Commonly known as:  (VITAMIN B-12) Inject 1,000 mcg into the muscle every 30 (thirty) days.   LORazepam 1 MG tablet Commonly known as:  ATIVAN Take 1 mg by mouth 3 (three) times daily as needed for anxiety.   magnesium oxide 400 (241.3 Mg) MG tablet Commonly known as:  MAG-OX Take 400 mg by mouth daily.   MULTIVITAMIN GUMMIES ADULT Chew Chew 1 each by mouth daily.   nitrofurantoin (macrocrystal-monohydrate) 100 MG capsule Commonly known as:  MACROBID Take 1 capsule (100 mg total) by mouth 2 (two) times daily.   ondansetron 4 MG disintegrating tablet Commonly known as:  ZOFRAN ODT Take 1 tablet (4 mg total) by mouth every 8 (eight) hours as needed for nausea.   oxyCODONE-acetaminophen 5-325 MG tablet Commonly known as:  PERCOCET/ROXICET Take 1-2 tablets by mouth every 4 (four) hours as needed for severe pain. Post-operatively / from kidney stones.   potassium chloride 20 MEQ/15ML (10%) Soln Take 20 mLs by mouth 4 (four) times daily.   Vitamin D (Ergocalciferol) 50000 units Caps capsule Commonly known as:  DRISDOL Take 50,000 Units by mouth every 7 (seven) days. ON [facility]      Follow-up Information    Winfred Burn, NP. Schedule an appointment as soon as possible for a visit in 2 week(s).   Specialty:  Nurse Practitioner Contact information: [address] [place] [address] [phone]        Alexis Frock, MD. Go on 12/15/2016.   Specialty:  Urology Why:  Scheduled surgery Contact  information: [address] [place] [address] [phone]          Allergies  Allergen Reactions  . Venofer [Ferric Oxide] Anaphylaxis  . Soap Hives, Itching and Other (See Comments)     allergic to [employer].    . Food Hives and Other (See Comments)     allergic to orange juice.   . Morphine And Related Swelling    When given IV, swelling around site  . Sulfamethoxazole-Trimethoprim Nausea And Vomiting    Patient developed AKI that appears to have been from bactrim. She might be able to take again if watched closely and it is absolutely needed - ie there was no anaphylaxis or allergic rash  . Iron Palpitations  . Levaquin [Levofloxacin] Hives    Consultations:  Urology   Procedures/Studies: US Transvaginal Non-ob  Result Date: 12/05/2016 CLINICAL DATA:  Adnexal mass.  LMP 11/16/2016 EXAM: TRANSABDOMINAL AND TRANSVAGINAL ULTRASOUND OF PELVIS TECHNIQUE: Both transabdominal and transvaginal ultrasound examinations of the pelvis were performed. Transabdominal technique was performed for global imaging of the pelvis including uterus, ovaries, adnexal regions, and pelvic cul-de-sac. It was necessary to proceed with endovaginal exam following the transabdominal exam to visualize the endometrium and ovaries. COMPARISON:  None FINDINGS: Uterus Measurements:  8.5 x 4.1 x 4.4 cm. No fibroids or other mass visualized. Endometrium Thickness: 10.3 mm.  No focal abnormality visualized. Right ovary Measurements: 5 x 1.8 x 2.4 cm. Normal appearance/no adnexal mass. Left ovary Measurements: 6.2 x 3.9 x 3.6 cm. Normal appearance/no adnexal mass. Multiple left ovarian follicles with the largest measuring 2.7 cm. Other findings Moderate amount of pelvic free fluid. IMPRESSION: 1. No adnexal mass. 2. Moderate amount of pelvic free fluid. Electronically Signed   By: Kathreen Devoid   On: 12/05/2016 12:29   US Pelvis Complete  Result Date: 12/05/2016 CLINICAL DATA:  Adnexal mass.  LMP 11/16/2016 EXAM:  TRANSABDOMINAL AND TRANSVAGINAL ULTRASOUND OF PELVIS TECHNIQUE: Both transabdominal and transvaginal ultrasound examinations of the pelvis were performed. Transabdominal technique was performed for global imaging of the pelvis including uterus, ovaries, adnexal regions, and pelvic cul-de-sac. It was necessary to proceed with endovaginal exam following the transabdominal exam to visualize the endometrium and ovaries. COMPARISON:  None FINDINGS: Uterus Measurements: 8.5 x 4.1 x 4.4 cm. No fibroids or other mass visualized. Endometrium Thickness: 10.3 mm.  No focal abnormality visualized. Right ovary Measurements: 5 x 1.8 x 2.4 cm. Normal appearance/no adnexal mass. Left ovary Measurements: 6.2 x 3.9 x 3.6 cm. Normal appearance/no adnexal mass. Multiple left ovarian follicles with the largest measuring 2.7 cm. Other findings Moderate amount of pelvic free fluid. IMPRESSION: 1. No adnexal mass. 2. Moderate amount of pelvic free fluid. Electronically Signed   By: Kathreen Devoid   On: 12/05/2016 12:29   US Renal  Result Date: 11/12/2016 CLINICAL DATA:  Left flank pain for the past 10 days. Recent bilateral stent placement for hydronephrosis. EXAM: RENAL / URINARY TRACT ULTRASOUND COMPLETE COMPARISON:  Abdomen and pelvis CT dated 10/31/2016 and abdomen radiograph dated 11/04/2016. FINDINGS: Right Kidney: Length: 11.2 cm. Borderline echogenic. Mildly dilated collecting system. 1.2 cm lower pole calculus. Left Kidney: Length: 12.6 cm. Borderline echogenic. Mild to moderate hydronephrosis. 1.1 cm mid to lower cyst. Bladder: Ureteral stents.  Tiny calculus.  Anteflexed uterus. IMPRESSION: 1. Mild right hydronephrosis and mild to moderate left hydronephrosis. 2. 1.2 cm lower pole right renal calculus. This represents the renal pelvis calculus seen on the recent CT. 3. Tiny bladder calculus. 4. 1.1 cm left renal cyst. Electronically Signed   By: Claudie Revering M.D.   On: 11/12/2016 09:47   Ct Renal Stone Study  Result Date:  12/03/2016 CLINICAL DATA:  Abdominal and flank pain. Kidney stone removed 2 days ago. Vomiting since yesterday. History of colon cancer. EXAM: CT ABDOMEN AND PELVIS WITHOUT CONTRAST TECHNIQUE: Multidetector CT imaging of the abdomen and pelvis was performed following the standard protocol without IV contrast. COMPARISON:  November 21, 2016 FINDINGS: Lower chest: No acute abnormality. Hepatobiliary: Stones or sludge seen posteriorly in the gallbladder. Focal fatty deposition is seen adjacent to the falciform ligament. The liver is otherwise normal. Pancreas: Unremarkable. No pancreatic ductal dilatation or surrounding inflammatory changes. Spleen: Normal in size without focal abnormality. Adrenals/Urinary Tract: The adrenal glands are normal. Bilateral ureteral stents are identified. The 10 mm stone in the right renal pelvis on the previous study there is much smaller in the interval. A fragment of stone is likely still present measuring 5 by 4 mm today while the stone measured 10 x 10 mm previously. High attenuation foci are also seen in the lower pole of the right kidney, likely small stone fragments. No perinephric stranding on the right. The right renal pelvis is mildly thick walled today with no significant  adjacent stranding. The right ureter contains a stent and is normal in appearance. No stones are seen along the course of the right ureter. There is a cyst in the left kidney. No definitive renal stones are seen in the left. The previously seen left-sided hydronephrosis has nearly resolved in the interval. There is still stranding inferior to the left kidney which is stable. The stranding extends down the pericolic gutter and along the left ureter which is still prominent in caliber proximally. No stones seen along the course of the left ureteral stent. Air in the bladder is likely from recent instrumentation. Ureteral stents and in the bladder. Stomach/Bowel: The stomach and small bowel are normal. The  patient is status post partial colectomy with a right lower quadrant ostomy. Vascular/Lymphatic: No significant vascular findings are present. No enlarged abdominal or pelvic lymph nodes. Reproductive: There is an apparent mass just to the left of the superior aspect of the uterus, possibly the left ovary or fallopian tube measuring 2.3 x 5.4 cm. The uterus and right ovary is normal in appearance. Other: There is a fluid collection posterior to the rectum on series 2, image 73 measuring 3.9 x 2.0 cm today versus 3.9 x 1.6 cm previously, similar in the interval. There is fluid in the left side of the pelvis anteriorly which is increased in the interval. There is also fluid in the inferior right pelvis which has increased in the interval. This is best seen on coronal image 35. No free air. Musculoskeletal: No acute or significant osseous findings. IMPRESSION: 1. The 10 mm stone in the right renal pelvis is no longer visualized. Multiple small stones in the right kidney and right renal pelvis may represent fragments from the previous larger stone. The right renal pelvis is mildly thick walled today, new in the interval. This could be due to a interval treatment of the right renal stone potentially. No hydronephrosis on the right. No stones along the course of the right ureter. 2. No stones seen in the left kidney. The previously seen left-sided hydronephrosis is significantly improved. However, there still stranding in the fat inferior to the left kidney and along the prominent left ureter. The stranding along the left ureter has not improved at all in the interval. The cause for the ongoing stranding is not seen as no stones are seen along the course of the left ureter. 3. There is an apparent mass to the left of the uterus which may be ovarian/adnexal. This was not seen previously. Ultrasound of the pelvis could better evaluate. 4. Increasing fluid in the pelvis bilaterally is nonspecific. It is possible this could  be physiologic from a ruptured ovarian cyst in a patient of this age. However, it could be reactive from a renal etiology given the thickening of the right renal pelvis and the significant stranding around the left ureter and lower kidney. Urine cannot be completely excluded given interval instrumentation but there is no fluid around the right kidney or proximal ureter to suggest the fluid represents urine. Electronically Signed   By: Dorise Bullion III M.D   On: 12/03/2016 18:59      Subjective: No nausea, vomiting or abdominal pain.  Discharge Exam: Vitals:   12/05/16 2214 12/06/16 0700  BP: 111/65 102/67  Pulse: 100 80  Resp: 16 16  Temp: 98.8 F (37.1 C) 97.9 F (36.6 C)   Vitals:   12/05/16 0449 12/05/16 1359 12/05/16 2214 12/06/16 0700  BP: 109/62 105/66 111/65 102/67  Pulse: 96 85  100 80  Resp: 16 16 16 16   Temp: 98 F (36.7 C) 98.3 F (36.8 C) 98.8 F (37.1 C) 97.9 F (36.6 C)  TempSrc: Oral Oral Oral Oral  SpO2: 98% 100% 100% 100%  Weight:    51.7 kg (113 lb 14.4 oz)  Height:        General: Pt is alert, awake, not in acute distress Cardiovascular: RRR, S1/S2 +, no rubs, no gallops Respiratory: CTA bilaterally, no wheezing, no rhonchi Abdominal: Soft, NT, ND, bowel sounds + Extremities: no edema, no cyanosis    The results of significant diagnostics from this hospitalization (including imaging, microbiology, ancillary and laboratory) are listed below for reference.     Microbiology: Recent Results (from the past 240 hour(s))  Culture, blood (routine x 2)     Status: None (Preliminary result)   Collection Time: 12/03/16  5:30 PM  Result Value Ref Range Status   Specimen Description BLOOD LEFT HAND  Final   Special Requests BOTTLES DRAWN AEROBIC AND ANAEROBIC 5CC  Final   Culture   Final    NO GROWTH 3 DAYS Performed at [facility]    Report Status PENDING  Incomplete  Culture, Urine     Status: Abnormal (Preliminary result)   Collection Time:  12/03/16  6:25 PM  Result Value Ref Range Status   Specimen Description URINE, CLEAN CATCH  Final   Special Requests NONE  Final   Culture 30,000 COLONIES/mL ENTEROCOCCUS SPECIES (A)  Final   Report Status PENDING  Incomplete   Organism ID, Bacteria ENTEROCOCCUS SPECIES (A)  Final      Susceptibility   Enterococcus species - MIC*    AMPICILLIN 16 RESISTANT Resistant     LEVOFLOXACIN 0.5 SENSITIVE Sensitive     NITROFURANTOIN <=16 SENSITIVE Sensitive     VANCOMYCIN <=0.5 SENSITIVE Sensitive     * 30,000 COLONIES/mL ENTEROCOCCUS SPECIES  Culture, blood (routine x 2)     Status: None (Preliminary result)   Collection Time: 12/03/16 10:59 PM  Result Value Ref Range Status   Specimen Description BLOOD LEFT ANTECUBITAL  Final   Special Requests IN PEDIATRIC BOTTLE 3 CC  Final   Culture   Final    NO GROWTH 2 DAYS Performed at [facility]    Report Status PENDING  Incomplete     Labs: BNP (last 3 results) No results for input(s): BNP in the last 8760 hours. Basic Metabolic Panel:  Recent Labs Lab 12/03/16 1612 12/04/16 0848 12/04/16 2037 12/05/16 0430 12/06/16 0445  NA 142 141 140 139 139  K 3.0* 3.6 3.7 3.8 3.4*  CL 110 110 113* 112* 112*  CO2 23 23 22 22 22   GLUCOSE 97 108* 97 97 99  BUN 8 8 8 7  <5*  CREATININE 1.27* 1.22* 1.06* 0.98 1.05*  CALCIUM 8.9 8.3* 7.7* 7.9* 8.2*  MG 1.3* 1.2*  --  2.1 1.6*  PHOS  --  2.9  --  3.0 3.1   Liver Function Tests:  Recent Labs Lab 12/03/16 1612 12/04/16 0848 12/05/16 0430 12/06/16 0445  AST 16 14* 15 13*  ALT 11* 10* 9* 8*  ALKPHOS 59 61 52 56  BILITOT 0.8 0.4 0.5 0.4  PROT 7.0 6.2* 5.5* 5.3*  ALBUMIN 3.6 3.3* 2.5* 2.6*    Recent Labs Lab 12/03/16 1612  LIPASE 22   No results for input(s): AMMONIA in the last 168 hours. CBC:  Recent Labs Lab 12/03/16 1612 12/04/16 0848 12/04/16 2037 12/05/16 0430 12/06/16 0445  WBC 8.2 8.4 5.8 5.9 6.5  NEUTROABS  --  6.2  --  3.7 4.1  HGB 10.0* 9.3* 8.3* 8.1*  8.3*  HCT 32.1* 30.1* 26.0* 25.8* 26.0*  MCV 84.5 83.1 84.7 84.9 83.3  PLT 282 225 195 197 240   Cardiac Enzymes: No results for input(s): CKTOTAL, CKMB, CKMBINDEX, TROPONINI in the last 168 hours. BNP: Invalid input(s): POCBNP CBG: No results for input(s): GLUCAP in the last 168 hours. D-Dimer No results for input(s): DDIMER in the last 72 hours. Hgb A1c No results for input(s): HGBA1C in the last 72 hours. Lipid Profile No results for input(s): CHOL, HDL, LDLCALC, TRIG, CHOLHDL, LDLDIRECT in the last 72 hours. Thyroid function studies No results for input(s): TSH, T4TOTAL, T3FREE, THYROIDAB in the last 72 hours.  Invalid input(s): FREET3 Anemia work up  Recent Labs  12/04/16 2037  VITAMINB12 320  FERRITIN 31   Urinalysis    Component Value Date/Time   COLORURINE YELLOW 12/03/2016 1816   APPEARANCEUR HAZY (A) 12/03/2016 1816   LABSPEC 1.015 12/03/2016 1816   PHURINE 6.0 12/03/2016 1816   GLUCOSEU NEGATIVE 12/03/2016 1816   HGBUR LARGE (A) 12/03/2016 1816   BILIRUBINUR NEGATIVE 12/03/2016 1816   KETONESUR NEGATIVE 12/03/2016 1816   PROTEINUR 100 (A) 12/03/2016 1816   UROBILINOGEN 0.2 07/29/2015 0320   NITRITE NEGATIVE 12/03/2016 1816   LEUKOCYTESUR LARGE (A) 12/03/2016 1816   Sepsis Labs Invalid input(s): PROCALCITONIN,  WBC,  LACTICIDVEN Microbiology Recent Results (from the past 240 hour(s))  Culture, blood (routine x 2)     Status: None (Preliminary result)   Collection Time: 12/03/16  5:30 PM  Result Value Ref Range Status   Specimen Description BLOOD LEFT HAND  Final   Special Requests BOTTLES DRAWN AEROBIC AND ANAEROBIC 5CC  Final   Culture   Final    NO GROWTH 3 DAYS Performed at [facility]    Report Status PENDING  Incomplete  Culture, Urine     Status: Abnormal (Preliminary result)   Collection Time: 12/03/16  6:25 PM  Result Value Ref Range Status   Specimen Description URINE, CLEAN CATCH  Final   Special Requests NONE  Final    Culture 30,000 COLONIES/mL ENTEROCOCCUS SPECIES (A)  Final   Report Status PENDING  Incomplete   Organism ID, Bacteria ENTEROCOCCUS SPECIES (A)  Final      Susceptibility   Enterococcus species - MIC*    AMPICILLIN 16 RESISTANT Resistant     LEVOFLOXACIN 0.5 SENSITIVE Sensitive     NITROFURANTOIN <=16 SENSITIVE Sensitive     VANCOMYCIN <=0.5 SENSITIVE Sensitive     * 30,000 COLONIES/mL ENTEROCOCCUS SPECIES  Culture, blood (routine x 2)     Status: None (Preliminary result)   Collection Time: 12/03/16 10:59 PM  Result Value Ref Range Status   Specimen Description BLOOD LEFT ANTECUBITAL  Final   Special Requests IN PEDIATRIC BOTTLE 3 CC  Final   Culture   Final    NO GROWTH 2 DAYS Performed at [facility]    Report Status PENDING  Incomplete     Time coordinating discharge: Over 30 minutes  SIGNED:   Cordelia Poche, MD [facility] 12/06/2016, 2:02 PM Pager [phone]  If 7PM-7AM, please contact night-coverage [email or web address] Password [email or web address]

## 2016-12-06 NOTE — Progress Notes (Signed)
Pt discharged home in stable condition. Discharge instructions given. Script sent to patient pharmacy of choice. No immediate questions or concerns

## 2016-12-07 LAB — URINE CULTURE

## 2016-12-09 LAB — CULTURE, BLOOD (ROUTINE X 2)
Culture: NO GROWTH
Culture: NO GROWTH

## 2016-12-12 ENCOUNTER — Encounter (HOSPITAL_BASED_OUTPATIENT_CLINIC_OR_DEPARTMENT_OTHER): Payer: Self-pay | Admitting: *Deleted

## 2016-12-12 NOTE — Progress Notes (Signed)
NPO AFTER MN W/ EXCEPTION CLEAR LIQUIDS UNTIL 0900 (NO CREAM/ MILK PRODUCTS).  ARRIVE AT 1315.  CURRENT LAB RESULTS IN CHART AND EPIC (CBC, CMET, ISTAT hCG).  MAY TAKE OXYCODONE/ ZOFRAN IF NEEDED AM DOS W/ SIPS OF WATER.

## 2016-12-15 ENCOUNTER — Encounter (HOSPITAL_BASED_OUTPATIENT_CLINIC_OR_DEPARTMENT_OTHER)
Admission: RE | Disposition: A | Discharge status: Home / Self Care | Payer: Self-pay | Source: Ambulatory Visit | Attending: Urology

## 2016-12-15 ENCOUNTER — Encounter (HOSPITAL_BASED_OUTPATIENT_CLINIC_OR_DEPARTMENT_OTHER): Payer: Self-pay | Admitting: *Deleted

## 2016-12-15 ENCOUNTER — Ambulatory Visit (HOSPITAL_BASED_OUTPATIENT_CLINIC_OR_DEPARTMENT_OTHER)
Admission: RE | Admit: 2016-12-15 | Discharge: 2016-12-15 | Disposition: A | Discharge status: Home / Self Care | Payer: Medicare Other | Source: Ambulatory Visit | Attending: Urology | Admitting: Urology

## 2016-12-15 ENCOUNTER — Ambulatory Visit (HOSPITAL_BASED_OUTPATIENT_CLINIC_OR_DEPARTMENT_OTHER): Payer: Medicare Other | Admitting: Anesthesiology

## 2016-12-15 DIAGNOSIS — N182 Chronic kidney disease, stage 2 (mild): Secondary | ICD-10-CM | POA: Insufficient documentation

## 2016-12-15 DIAGNOSIS — N200 Calculus of kidney: Secondary | ICD-10-CM | POA: Diagnosis present

## 2016-12-15 HISTORY — PX: CYSTOSCOPY W/ RETROGRADES: SHX1426

## 2016-12-15 HISTORY — DX: Personal history of other infectious and parasitic diseases: Z86.19

## 2016-12-15 HISTORY — DX: Hypomagnesemia: E83.42

## 2016-12-15 HISTORY — DX: Anemia, unspecified: D64.9

## 2016-12-15 HISTORY — DX: Chronic kidney disease, stage 2 (mild): N18.2

## 2016-12-15 HISTORY — PX: HOLMIUM LASER APPLICATION: SHX5852

## 2016-12-15 HISTORY — PX: CYSTOSCOPY W/ URETERAL STENT REMOVAL: SHX1430

## 2016-12-15 HISTORY — PX: CYSTOSCOPY W/ URETERAL STENT PLACEMENT: SHX1429

## 2016-12-15 HISTORY — PX: CYSTOSCOPY WITH URETEROSCOPY: SHX5123

## 2016-12-15 SURGERY — CYSTOSCOPY WITH URETEROSCOPY
Anesthesia: General | Site: Ureter | Laterality: Right

## 2016-12-15 MED ORDER — ONDANSETRON HCL 4 MG/2ML IJ SOLN
4.0000 mg | Freq: Once | INTRAMUSCULAR | Status: DC | PRN
  Filled 2016-12-15: qty 2

## 2016-12-15 MED ORDER — LACTATED RINGERS IV SOLN
INTRAVENOUS | Status: DC
  Administered 2016-12-15 (×2): via INTRAVENOUS
  Filled 2016-12-15: qty 1000

## 2016-12-15 MED ORDER — MIDAZOLAM HCL 2 MG/2ML IJ SOLN
INTRAMUSCULAR | Status: AC
  Filled 2016-12-15: qty 2

## 2016-12-15 MED ORDER — FENTANYL CITRATE (PF) 100 MCG/2ML IJ SOLN
INTRAMUSCULAR | Status: AC
  Filled 2016-12-15: qty 2

## 2016-12-15 MED ORDER — SUCCINYLCHOLINE CHLORIDE 200 MG/10ML IV SOSY
PREFILLED_SYRINGE | INTRAVENOUS | Status: AC
  Filled 2016-12-15: qty 10

## 2016-12-15 MED ORDER — IOHEXOL 300 MG/ML  SOLN
INTRAMUSCULAR | Status: DC | PRN
  Administered 2016-12-15: 7 mL

## 2016-12-15 MED ORDER — ROCURONIUM BROMIDE 50 MG/5ML IV SOSY
PREFILLED_SYRINGE | INTRAVENOUS | Status: AC
  Filled 2016-12-15: qty 5

## 2016-12-15 MED ORDER — LIDOCAINE 2% (20 MG/ML) 5 ML SYRINGE
INTRAMUSCULAR | Status: AC
  Filled 2016-12-15: qty 5

## 2016-12-15 MED ORDER — SUGAMMADEX SODIUM 200 MG/2ML IV SOLN
INTRAVENOUS | Status: AC
  Filled 2016-12-15: qty 2

## 2016-12-15 MED ORDER — ONDANSETRON HCL 4 MG/2ML IJ SOLN
INTRAMUSCULAR | Status: AC
  Filled 2016-12-15: qty 2

## 2016-12-15 MED ORDER — CEFTRIAXONE SODIUM 2 G IJ SOLR
INTRAMUSCULAR | Status: AC
  Filled 2016-12-15: qty 2

## 2016-12-15 MED ORDER — OXYCODONE-ACETAMINOPHEN 5-325 MG PO TABS
1.0000 | ORAL_TABLET | ORAL | Status: DC | PRN
  Administered 2016-12-15: 2 via ORAL
  Filled 2016-12-15: qty 2

## 2016-12-15 MED ORDER — DEXTROSE 5 % IV SOLN
1.0000 g | INTRAVENOUS | Status: DC
  Filled 2016-12-15: qty 10

## 2016-12-15 MED ORDER — DEXAMETHASONE SODIUM PHOSPHATE 4 MG/ML IJ SOLN
INTRAMUSCULAR | Status: DC | PRN
  Administered 2016-12-15: 10 mg via INTRAVENOUS

## 2016-12-15 MED ORDER — ROCURONIUM BROMIDE 50 MG/5ML IV SOSY
PREFILLED_SYRINGE | INTRAVENOUS | Status: DC | PRN
Start: 2016-12-15 — End: 2016-12-15
  Administered 2016-12-15: 20 mg via INTRAVENOUS

## 2016-12-15 MED ORDER — DEXAMETHASONE SODIUM PHOSPHATE 10 MG/ML IJ SOLN
INTRAMUSCULAR | Status: AC
  Filled 2016-12-15: qty 1

## 2016-12-15 MED ORDER — SODIUM CHLORIDE 0.9 % IR SOLN
Status: DC | PRN
  Administered 2016-12-15: 4000 mL

## 2016-12-15 MED ORDER — PROPOFOL 10 MG/ML IV BOLUS
INTRAVENOUS | Status: DC | PRN
  Administered 2016-12-15: 150 mg via INTRAVENOUS

## 2016-12-15 MED ORDER — DEXTROSE 5 % IV SOLN
INTRAVENOUS | Status: AC
  Filled 2016-12-15: qty 50

## 2016-12-15 MED ORDER — SUGAMMADEX SODIUM 200 MG/2ML IV SOLN
INTRAVENOUS | Status: DC | PRN
  Administered 2016-12-15: 200 mg via INTRAVENOUS

## 2016-12-15 MED ORDER — FENTANYL CITRATE (PF) 100 MCG/2ML IJ SOLN
INTRAMUSCULAR | Status: DC | PRN
  Administered 2016-12-15: 50 ug via INTRAVENOUS

## 2016-12-15 MED ORDER — OXYCODONE-ACETAMINOPHEN 5-325 MG PO TABS
ORAL_TABLET | ORAL | Status: AC
  Filled 2016-12-15: qty 2

## 2016-12-15 MED ORDER — METOCLOPRAMIDE HCL 5 MG/ML IJ SOLN
10.0000 mg | Freq: Once | INTRAMUSCULAR | Status: AC
  Administered 2016-12-15: 10 mg via INTRAVENOUS
  Filled 2016-12-15: qty 2

## 2016-12-15 MED ORDER — ONDANSETRON 4 MG PO TBDP: 4.0000 mg | ORAL_TABLET | Freq: Three times a day (TID) | ORAL | 0 refills | Status: DC | PRN

## 2016-12-15 MED ORDER — PROPOFOL 10 MG/ML IV BOLUS
INTRAVENOUS | Status: AC
  Filled 2016-12-15: qty 20

## 2016-12-15 MED ORDER — METOCLOPRAMIDE HCL 5 MG/ML IJ SOLN
INTRAMUSCULAR | Status: AC
  Filled 2016-12-15: qty 2

## 2016-12-15 MED ORDER — DEXTROSE 5 % IV SOLN
2.0000 g | INTRAVENOUS | Status: AC
  Administered 2016-12-15: 2 g via INTRAVENOUS
  Filled 2016-12-15: qty 2

## 2016-12-15 MED ORDER — ONDANSETRON HCL 4 MG/2ML IJ SOLN
INTRAMUSCULAR | Status: DC | PRN
  Administered 2016-12-15: 4 mg via INTRAVENOUS

## 2016-12-15 MED ORDER — SUCCINYLCHOLINE CHLORIDE 200 MG/10ML IV SOSY
PREFILLED_SYRINGE | INTRAVENOUS | Status: DC | PRN
  Administered 2016-12-15: 100 mg via INTRAVENOUS

## 2016-12-15 MED ORDER — OXYCODONE-ACETAMINOPHEN 5-325 MG PO TABS: 1.0000 | ORAL_TABLET | Freq: Four times a day (QID) | ORAL | 0 refills | Status: DC | PRN

## 2016-12-15 MED ORDER — FENTANYL CITRATE (PF) 100 MCG/2ML IJ SOLN
25.0000 ug | INTRAMUSCULAR | Status: DC | PRN
  Administered 2016-12-15 (×2): 25 ug via INTRAVENOUS
  Administered 2016-12-15: 50 ug via INTRAVENOUS
  Filled 2016-12-15: qty 1

## 2016-12-15 MED ORDER — LIDOCAINE 2% (20 MG/ML) 5 ML SYRINGE
INTRAMUSCULAR | Status: DC | PRN
  Administered 2016-12-15: 80 mg via INTRAVENOUS

## 2016-12-15 MED ORDER — CIPROFLOXACIN HCL 500 MG PO TABS: 500.0000 mg | ORAL_TABLET | Freq: Two times a day (BID) | ORAL | 0 refills | Status: DC

## 2016-12-15 MED ORDER — MIDAZOLAM HCL 5 MG/5ML IJ SOLN
INTRAMUSCULAR | Status: DC | PRN
  Administered 2016-12-15: 2 mg via INTRAVENOUS

## 2016-12-15 SURGICAL SUPPLY — 45 items
BAG DRAIN URO-CYSTO SKYTR STRL (DRAIN) ×4 IMPLANT
BAG DRN UROCATH (DRAIN) ×3
BASKET LASER NITINOL 1.9FR (BASKET) ×3 IMPLANT
BASKET STNLS GEMINI 4WIRE 3FR (BASKET) IMPLANT
BASKET STONE NCOMPASS (UROLOGICAL SUPPLIES) ×1 IMPLANT
BASKET ZERO TIP NITINOL 2.4FR (BASKET) IMPLANT
BSKT STON RTRVL 120 1.9FR (BASKET)
BSKT STON RTRVL GEM 120X11 3FR (BASKET)
BSKT STON RTRVL ZERO TP 2.4FR (BASKET)
CATH INTERMIT  6FR 70CM (CATHETERS) ×4 IMPLANT
CATH URET 5FR 28IN CONE TIP (BALLOONS)
CATH URET 5FR 28IN OPEN ENDED (CATHETERS) IMPLANT
CATH URET 5FR 70CM CONE TIP (BALLOONS) IMPLANT
CLOTH BEACON ORANGE TIMEOUT ST (SAFETY) ×4 IMPLANT
ELECT REM PT RETURN 9FT ADLT (ELECTROSURGICAL)
ELECTRODE REM PT RTRN 9FT ADLT (ELECTROSURGICAL) IMPLANT
FIBER LASER FLEXIVA 365 (UROLOGICAL SUPPLIES) IMPLANT
FIBER LASER TRAC TIP (UROLOGICAL SUPPLIES) ×1 IMPLANT
GLOVE BIO SURGEON STRL SZ7.5 (GLOVE) ×4 IMPLANT
GLOVE BIOGEL PI IND STRL 6.5 (GLOVE) IMPLANT
GLOVE BIOGEL PI INDICATOR 6.5 (GLOVE) ×1
GOWN STRL REUS W/ TWL LRG LVL3 (GOWN DISPOSABLE) ×6 IMPLANT
GOWN STRL REUS W/ TWL XL LVL3 (GOWN DISPOSABLE) ×3 IMPLANT
GOWN STRL REUS W/TWL LRG LVL3 (GOWN DISPOSABLE) ×10 IMPLANT
GOWN STRL REUS W/TWL XL LVL3 (GOWN DISPOSABLE)
GUIDEWIRE 0.038 PTFE COATED (WIRE) IMPLANT
GUIDEWIRE ANG ZIPWIRE 038X150 (WIRE) ×4 IMPLANT
GUIDEWIRE STR DUAL SENSOR (WIRE) ×4 IMPLANT
IV NS 1000ML (IV SOLUTION) ×4
IV NS 1000ML BAXH (IV SOLUTION) ×3 IMPLANT
IV NS IRRIG 3000ML ARTHROMATIC (IV SOLUTION) ×7 IMPLANT
KIT BALLIN UROMAX 15FX10 (LABEL) IMPLANT
KIT BALLN UROMAX 15FX4 (MISCELLANEOUS) IMPLANT
KIT BALLN UROMAX 26 75X4 (MISCELLANEOUS)
KIT ROOM TURNOVER WOR (KITS) ×4 IMPLANT
MANIFOLD NEPTUNE II (INSTRUMENTS) ×1 IMPLANT
NS IRRIG 500ML POUR BTL (IV SOLUTION) ×4 IMPLANT
PACK CYSTO (CUSTOM PROCEDURE TRAY) ×4 IMPLANT
SET HIGH PRES BAL DIL (LABEL)
SHEATH ACCESS URETERAL 24CM (SHEATH) ×1 IMPLANT
STENT POLARIS 5FRX22 (STENTS) ×1 IMPLANT
SYRINGE 10CC LL (SYRINGE) ×4 IMPLANT
SYRINGE IRR TOOMEY STRL 70CC (SYRINGE) IMPLANT
TUBE CONNECTING 12X1/4 (SUCTIONS) IMPLANT
TUBE FEEDING 8FR 16IN STR KANG (MISCELLANEOUS) ×4 IMPLANT

## 2016-12-15 NOTE — Brief Op Note (Signed)
12/15/2016  4:05 PM  PATIENT:  Cindy [name]  26 y.o. female  PRE-OPERATIVE DIAGNOSIS:  RIGHT RENAL STONES  POST-OPERATIVE DIAGNOSIS:  RIGHT RENAL STONES  PROCEDURE:  Procedure(s): SECOND STAGE -CYSTOSCOPY WITH URETEROSCOPY AND STONE EXTRACTION WITH BASKET (Right) HOLMIUM LASER APPLICATION (Right) CYSTOSCOPY WITH STENT REMOVAL (Bilateral) CYSTOSCOPY WITH STENT REPLACEMENT (Right) CYSTOSCOPY WITH RETROGRADE PYELOGRAM (Right)  SURGEON:  Surgeon(s) and Role:    * Alexis Frock, MD - Primary  PHYSICIAN ASSISTANT:   ASSISTANTS: none   ANESTHESIA:   general  EBL:  Total I/O In: 1000 [I.V.:1000] Out: -   BLOOD ADMINISTERED:none  DRAINS: none   LOCAL MEDICATIONS USED:  NONE  SPECIMEN:  Source of Specimen:  Rt renal stone fragments  DISPOSITION OF SPECIMEN:  discard  COUNTS:  YES  TOURNIQUET:  * No tourniquets in log *  DICTATION: .Other Dictation: Dictation Number [id number]  PLAN OF CARE: Discharge to home after PACU  PATIENT DISPOSITION:  PACU - hemodynamically stable.   Delay start of Pharmacological VTE agent (>24hrs) due to surgical blood loss or risk of bleeding: yes

## 2016-12-15 NOTE — Discharge Instructions (Signed)
1 - You may have urinary urgency (bladder spasms) and bloody urine on / off with stent in place. This is normal.  2 - Remove tethered stent on Monday morning at home by pulling on string, then blue-white plastic tubing, and discarding. Office is open Monday if any acute issues arise.   3 - Call MD or go to ER for fever >102, severe pain / nausea / vomiting not relieved by medications, or acute change in medical status   Post Anesthesia Home Care Instructions  Activity: Get plenty of rest for the remainder of the day. A responsible adult should stay with you for 24 hours following the procedure.  For the next 24 hours, DO NOT: -Drive a car -Paediatric nurse -Drink alcoholic beverages -Take any medication unless instructed by your physician -Make any legal decisions or sign important papers.  Meals: Start with liquid foods such as gelatin or soup. Progress to regular foods as tolerated. Avoid greasy, spicy, heavy foods. If nausea and/or vomiting occur, drink only clear liquids until the nausea and/or vomiting subsides. Call your physician if vomiting continues.  Special Instructions/Symptoms: Your throat may feel dry or sore from the anesthesia or the breathing tube placed in your throat during surgery. If this causes discomfort, gargle with warm salt water. The discomfort should disappear within 24 hours.  If you had a scopolamine patch placed behind your ear for the management of post- operative nausea and/or vomiting:  1. The medication in the patch is effective for 72 hours, after which it should be removed.  Wrap patch in a tissue and discard in the trash. Wash hands thoroughly with soap and water. 2. You may remove the patch earlier than 72 hours if you experience unpleasant side effects which may include dry mouth, dizziness or visual disturbances. 3. Avoid touching the patch. Wash your hands with soap and water after contact with the patch.

## 2016-12-15 NOTE — Anesthesia Preprocedure Evaluation (Signed)
Anesthesia Evaluation  Patient identified by MRN, date of birth, ID band Patient awake    Reviewed: Allergy & Precautions, NPO status , Patient's Chart, lab work & pertinent test results  Airway Mallampati: II  TM Distance: >3 FB Neck ROM: Full    Dental  (+) Teeth Intact, Dental Advisory Given   Pulmonary    breath sounds clear to auscultation       Cardiovascular  Rhythm:Regular Rate:Normal     Neuro/Psych    GI/Hepatic   Endo/Other    Renal/GU      Musculoskeletal   Abdominal   Peds  Hematology   Anesthesia Other Findings   Reproductive/Obstetrics                             Anesthesia Physical Anesthesia Plan  ASA: II and emergent  Anesthesia Plan: General   Post-op Pain Management:    Induction: Intravenous  Airway Management Planned: Oral ETT  Additional Equipment:   Intra-op Plan:   Post-operative Plan: Extubation in OR  Informed Consent: I have reviewed the patients History and Physical, chart, labs and discussed the procedure including the risks, benefits and alternatives for the proposed anesthesia with the patient or authorized representative who has indicated his/her understanding and acceptance.   Dental advisory given  Plan Discussed with: CRNA and Anesthesiologist  Anesthesia Plan Comments:         Anesthesia Quick Evaluation

## 2016-12-15 NOTE — Anesthesia Procedure Notes (Signed)
Procedure Name: Intubation Date/Time: 12/15/2016 3:26 PM Performed by: Denna Haggard D Pre-anesthesia Checklist: Patient identified, Emergency Drugs available, Suction available and Patient being monitored Patient Re-evaluated:Patient Re-evaluated prior to inductionOxygen Delivery Method: Circle system utilized Preoxygenation: Pre-oxygenation with 100% oxygen Intubation Type: IV induction Ventilation: Mask ventilation without difficulty Laryngoscope Size: Mac and 3 Grade View: Grade I Tube type: Oral Tube size: 7.0 mm Number of attempts: 1 Airway Equipment and Method: Stylet and Oral airway Placement Confirmation: ETT inserted through vocal cords under direct vision,  positive ETCO2 and breath sounds checked- equal and bilateral Secured at: 21 cm Tube secured with: Tape Dental Injury: Teeth and Oropharynx as per pre-operative assessment

## 2016-12-15 NOTE — Progress Notes (Addendum)
Pt called today stating she has been had nausea/ vomiting since last night.  Out of nausea prescription.  Pt is asking if she should go to ED or come on in for procedure scheduled today at [facility] at 1500.  Pt asked if she thought is was from virus , kidney stone, or symptom colon cancer, pt stated felt it was from stone.  Told pt will call her back after getting message to Dr Tresa Moore , whom is in surgery at this moment, for his recommendation.  Called in Virtual OR rm 5, spoke Programme researcher, broadcasting/film/video , or Economist, relayed message for Dr Tresa Moore.  Otila Kluver will call me back when Dr Tresa Moore is able to give response during surgery.  ADDENDUM:   Received call back from OR, spoke w/ Jonelle Sidle, stated per Dr Tresa Moore tell pt to come on in to [facility] to pre-op and he will evaluate her after his first surgical case that is scheduled to end at 1100.  Called and left message for pt via phone , to come on in to pre-op at [facility] per Dr Tresa Moore to be evaluated .  Asked pt to call me back to confirm she received message.  Addendum:  Pt called back via phone and confirmed she received message and will come on in.

## 2016-12-15 NOTE — H&P (Signed)
Cindy [name] is an 26 y.o. female.    Chief Complaint: Pre-op 2nd stage RIGHT ureteroscopic stone manipulation.   HPI:   1 - Recurrent Nephrolithiasis -long h/o recurrent nephrolithiasis with medical stone passage x many. She states this predates her iliostomy but has worsned since. She may desire future elective pregnancy.  10/2016 - CT - Rt upper pole 75mm stone with focal upper pole hydro, scattered Lt punctate renal stones -->bilateral JJ stents placed 11/01/16, 1st sage bilateral ureteroscopic stone manipulation 12/01/16.   2 - Chronic Mild Hydronephrosis -long h/o Rt>Lt mild hydro on imaging since at least 2009 even at times when stones not present. No prior renography. Baselien Cr 1.3's.   PMH sig for FAP/total colectomy with end iliostomy (follows Cindy [name] at [facility] Leg reconstruction after injury.   Today "Cindy [name]" is seen to proced with 2nd stage RIGHT ureteroscopic stone manipulation. She did have brief interval admission for suspect pyelo, UCX at time with enterococcus sens to fluoroquinolones and she was treated with multiweek course.   Past Medical History:  Diagnosis Date  . Bilateral flank pain    due to ureteral stents  . Chronic hypokalemia   . CKD (chronic kidney disease), stage II   . 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 Complication of anesthesia    post op aspiration pneumonia w/ lap. appy 12-23-2007 (emergent ruptured appendix)  . FAP (familial adenomatous polyposis) 09/30/2014  . Heart murmur   . History of acute renal failure    2015;  2016  . History of fetal demise, not currently pregnant    03-22-2013  stillborn at 10 wks  . History of kidney stones   . History of sepsis    admitted 12-03-2016 (post-op day 2 w/ bilateral ureteral stents and  stone manipluation) discharged 12-06-2016  for urosepsis  . Hypomagnesemia   . Ileostomy in place [facility])   . Iron deficiency anemia   . Nephrolithiasis    BILATERAL   . Normocytic anemia   . Renal cyst, left    PER CT 11-12-2016  . Urgency of urination     Past Surgical History:  Procedure Laterality Date  . COLONOSCOPY N/A 05/24/2013   Procedure: COLONOSCOPY;  Surgeon: Cindy Sabins, MD;  Location: [place];  Service: Endoscopy;  Laterality: N/A;  . COMPLETION PROTECTOMY AND REVISION ILEOSTOMY/ LYSIS ADHESIONS  07-28-2014    [facility]  . CYSTOSCOPY W/ URETERAL STENT PLACEMENT Bilateral 11/01/2016   Procedure: CYSTOSCOPY WITH BILATERAL RETROGRADE PYELOGRAM BILATERAL URETERAL STENT PLACEMENT;  Surgeon: Cindy Frock, MD;  Location: [facility];  Service: Urology;  Laterality: Bilateral;  . CYSTOSCOPY WITH URETEROSCOPY Bilateral 12/01/2016   Procedure: FIRST STAGE CYSTOSCOPY WITH URETEROSCOPY,  STONE MANIPULATION and BASKETRY, STENT EXCHANGE;  Surgeon: Cindy Frock, MD;  Location: [facility];  Service: Urology;  Laterality: Bilateral;  . HOLMIUM LASER APPLICATION Bilateral 07/29/1193   Procedure: HOLMIUM LASER APPLICATION;  Surgeon: Cindy Frock, MD;  Location: [facility];  Service: Urology;  Laterality: Bilateral;  . I&D EXTREMITY Left 11/14/2013   Procedure: IRRIGATION AND DEBRIDEMENT LEFT LONG FINGER;  Surgeon: Cindy Must, MD;  Location: [place];  Service: Orthopedics;  Laterality: Left;  . IM NAILING TIBIA Left 08/23/2010  . LAPAROSCOPIC APPENDECTOMY  12/23/2007  . LEG RECONSTRUCTION USING  FASCIAL FLAP  ~ 2009  . SUBTOTAL COLECTOMY /  RESECTION ENBLOC DISTAL ILEUM/  ILEOSTOMY  07/ 2014  [facility]  . TRANSTHORACIC ECHOCARDIOGRAM  11/11/2011   ef 55-60%/  mild MR/  trivial PR and TR/  PASP 29-30mmHg/  trivial pericardial effusion identified    Family History  Problem Relation Age of Onset  . Adopted: Yes  . ALS Mother   . Alcohol abuse Neg  Hx   . Arthritis Neg Hx   . Asthma Neg Hx   . Birth defects Neg Hx   . Cancer Neg Hx   . COPD Neg Hx   . Depression Neg Hx   . Diabetes Neg Hx   . Drug abuse Neg Hx   . Early death Neg Hx   . Hearing loss Neg Hx   . Heart disease Neg Hx   . Hyperlipidemia Neg Hx   . Hypertension Neg Hx   . Kidney disease Neg Hx   . Learning disabilities Neg Hx   . Mental illness Neg Hx   . Mental retardation Neg Hx   . Miscarriages / Stillbirths Neg Hx   . Stroke Neg Hx   . Vision loss Neg Hx    Social History:  reports that she has never smoked. She has never used smokeless tobacco. She reports that she does not drink alcohol or use drugs.  Allergies:  Allergies  Allergen Reactions  . Venofer [Ferric Oxide] Anaphylaxis  . Soap Hives, Itching and Other (See Comments)     allergic to [employer].    . Food Hives and Other (See Comments)     allergic to orange juice.   . Morphine And Related Swelling    When given IV, swelling around site  . Sulfamethoxazole-Trimethoprim Nausea And Vomiting    Patient developed AKI that appears to have been from bactrim. She might be able to take again if watched closely and it is absolutely needed - ie there was no anaphylaxis or allergic rash  . Iron Palpitations  . Levaquin [Levofloxacin] Hives    No prescriptions prior to admission.    No results found for this or any previous visit (from the past 48 hour(s)). No results found.  Review of Systems  Constitutional: Negative.  Negative for chills and fever.  HENT: Negative.   Eyes: Negative.   Respiratory: Negative.   Cardiovascular: Negative.   Gastrointestinal: Negative.   Genitourinary: Positive for hematuria.  Musculoskeletal: Negative.   Skin: Negative.   Neurological: Negative.   Endo/Heme/Allergies: Negative.   Psychiatric/Behavioral: Negative.     Last menstrual period 11/15/2016. Physical Exam  Constitutional: She appears well-developed.  Very, very thin.   HENT:  Head:  Normocephalic.  Eyes: Pupils are equal, round, and reactive to light.  Neck: Normal range of motion.  Cardiovascular: Normal rate.   Respiratory: Effort normal.  GI: Soft.  RLQ iliostomy patent. Midline scars w/o hernias.   Genitourinary:  Genitourinary Comments: No CVAT.   Musculoskeletal: Normal range of motion.  Neurological: She is alert.  Skin: Skin is warm.  Psychiatric: She has a normal mood and affect.     Assessment/Plan  1 - Recurrent Nephrolithiasis - proceed as planned with RIGHT 2nd stage ureteroscopic stone manipulation. Risks, benefits, alternatives, expected peri-op course discussed extensively previously and reiterated today. She has been very prone to infectious problems and has been treated accordingly pre-op based on most recent CX data.    Cindy Frock, MD 12/15/2016, 6:39 AM

## 2016-12-15 NOTE — Transfer of Care (Signed)
Immediate Anesthesia Transfer of Care Note  Patient: Cindy [name]  Procedure(s) Performed: Procedure(s) (LRB): SECOND STAGE -CYSTOSCOPY WITH URETEROSCOPY AND STONE EXTRACTION WITH BASKET (Right) HOLMIUM LASER APPLICATION (Right) CYSTOSCOPY WITH STENT REMOVAL (Bilateral) CYSTOSCOPY WITH STENT REPLACEMENT (Right) CYSTOSCOPY WITH RETROGRADE PYELOGRAM (Right)  Patient Location: PACU  Anesthesia Type: General  Level of Consciousness: awake, oriented, sedated and patient cooperative  Airway & Oxygen Therapy: Patient Spontanous Breathing and Patient connected to face mask oxygen  Post-op Assessment: Report given to PACU RN and Post -op Vital signs reviewed and stable  Post vital signs: Reviewed and stable  Complications: No apparent anesthesia complications  Last Vitals:  Vitals:   12/15/16 1614 12/15/16 1615  BP: (!) 110/58 (!) 110/58  Pulse: 96 94  Resp: (!) 22 (!) 23  Temp: 36.8 C

## 2016-12-17 NOTE — Anesthesia Postprocedure Evaluation (Addendum)
Anesthesia Post Note  Patient: Cindy [name]  Procedure(s) Performed: Procedure(s) (LRB): SECOND STAGE -CYSTOSCOPY WITH URETEROSCOPY AND STONE EXTRACTION WITH BASKET (Right) HOLMIUM LASER APPLICATION (Right) CYSTOSCOPY WITH STENT REMOVAL (Bilateral) CYSTOSCOPY WITH STENT REPLACEMENT (Right) CYSTOSCOPY WITH RETROGRADE PYELOGRAM (Right)  Patient location during evaluation: PACU Anesthesia Type: General Level of consciousness: awake, awake and alert and oriented Pain management: pain level controlled Vital Signs Assessment: post-procedure vital signs reviewed and stable Respiratory status: spontaneous breathing, nonlabored ventilation and respiratory function stable Cardiovascular status: blood pressure returned to baseline Anesthetic complications: no       Last Vitals:  Vitals:   12/15/16 1700 12/15/16 1741  BP: 118/68 (!) 100/56  Pulse: (!) 101 100  Resp: 13 12  Temp: 36.6 C 36.8 C    Last Pain:  Vitals:   12/15/16 1741  TempSrc:   PainSc: 6                  Kmarion Rawl COKER

## 2016-12-18 ENCOUNTER — Encounter (HOSPITAL_BASED_OUTPATIENT_CLINIC_OR_DEPARTMENT_OTHER): Payer: Self-pay | Admitting: Urology

## 2016-12-18 NOTE — Op Note (Signed)
[name], [name] NO.:  [id number]  MEDICAL RECORD NO.:  [id number]  LOCATION:                                 FACILITY:  PHYSICIAN:  Alexis Frock, MD     DATE OF BIRTH:  January 30, 1991  DATE OF PROCEDURE: 12/15/2016                              OPERATIVE REPORT   DIAGNOSES: 1. Residual right renal stone. 2. History of recurrent nephrolithiasis and recurrent urosepsis.  PROCEDURE: 1. Cystoscopy with right retrograde pyelogram and interpretation. 2. Second-stage right ureteroscopy with laser lithotripsy and stent     exchange. 3. Left ureteral stent removal.  ESTIMATED BLOOD LOSS:  Nil.  COMPLICATIONS:  None.  SPECIMENS:  Right renal stone fragments for compositional analysis.  FINDINGS: 1. In situ bilateral stents. 2. Removal of bilateral in situ ureteral stents. 3. Unremarkable right retrograde pyelogram. 4. Multifocal small volume right intrarenal stone, estimate 8 mm total     largest fragment upper pole approximately 4 mm. 5. Complete resolution of all stone fragments larger than 1/3rd mm     following laser lithotripsy and basket extraction on the right. 6. Successful placement of right ureteral stent, proximal in renal     pelvis and distal in urinary bladder.  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 She has had a recent multi-stage recurrence initially managed with bilateral stenting at the time of acute infection and acute renal failure, now status post first-stage bilateral ureteroscopic stimulation once the left side was completely cleared.  She now presents for final stage on the right and left ureteral stent removal.  She has  been afebrile.  She has been on culture-specific antibiotics.  PROCEDURE IN DETAIL:  The patient being [employer] verified. Procedure being left ureteral stent removal, right second-stage ureteroscopic stone manipulation was confirmed.  Procedure was carried out.  Time-out was performed.  Intravenous antibiotics were administered.  General endotracheal anesthesia introduced.  The patient placed into a low lithotomy position.  Sterile field was created by prepping and draping the patient's vagina, introitus, and proximal thighs using iodine.  Next, cystourethroscopy was performed using a rigid cystoscope with offset lens.  In situ bilateral ureteral stents were seen in the distal level.  The left ureteral stent was grasped and brought out in its entirety, set aside for discard.  The right ureteral stent was brought to the level of the urethral meatus through which a 0.038 Zip wire was advanced to the level of lower pole and set aside as a safety wire.  Next, semi-rigid ureteroscopy was performed for the entire length of the right ureter alongside a separate Sensor working wire.  No mucosal abnormalities or calcifications were noted.  The semi- rigid scope was exchanged for a 12/14, 24-cm ureteral access sheath at the level of proximal ureter using fluoroscopic guidance and flexible digital ureteroscopy was performed on the right side.  This revealed multifocal relatively small volume right renal stone compared to her prior stone volume.  There was a single dominant fragment in the upper pole approximately 4 mm that appeared to be too large for simple basketing.  There are multifocal small volume lower pole stones estimated approximately 4 mm in total as well.  It did appear amenable to basketing.  Holmium laser energy applied to the dominant upper pole stone using 0.2 joules and 20 Hz fragmenting approximately 4 smaller pieces.  These were sequentially grasped with the encompass  basket brought out and set aside for discard.  Again encompass basket was also used to remove all accessible aspects of her lower pole stones, set aside all fragments larger than 1/3rd mm were removed.  Pan endoscopic examination was performed of the right kidney again including all calices x2.  Again all stone fragments larger than 1/3rd mm had been satisfactorily removed.  There was no evidence of renal perforation. There was excellent hemostasis.  Final retrograde pyelogram revealed no evidence of contrast extravasation and confirmation of inspection of all calices.  The access sheath was removed under continuous vision.  No mucosal abnormalities were found.  Finally, a new 5 x 22 Polaris-type stent was placed using fluoroscopic guidance.  Good proximal and distal deployment were noted.  Tether was left in place and fashioned to the thigh and the procedure was terminated.  The patient tolerated the procedure well.  There were no immediate periprocedural complications. The patient was taken to the Postanesthesia Care Unit in a stable condition.    ______________________________ Alexis Frock, MD   ______________________________ Alexis Frock, MD    TM/MEDQ  D:  12/15/2016  T:  12/16/2016  Job:  [id number]

## 2017-06-07 ENCOUNTER — Encounter (HOSPITAL_COMMUNITY): Payer: Self-pay

## 2017-06-07 ENCOUNTER — Inpatient Hospital Stay (HOSPITAL_COMMUNITY)
Admission: EM | Admit: 2017-06-07 | Discharge: 2017-06-08 | DRG: 682 | Disposition: A | Discharge status: Home / Self Care | Payer: Medicare Other | Attending: Family Medicine | Admitting: Family Medicine

## 2017-06-07 DIAGNOSIS — E871 Hypo-osmolality and hyponatremia: Secondary | ICD-10-CM | POA: Diagnosis not present

## 2017-06-07 DIAGNOSIS — E876 Hypokalemia: Secondary | ICD-10-CM | POA: Diagnosis not present

## 2017-06-07 DIAGNOSIS — K529 Noninfective gastroenteritis and colitis, unspecified: Secondary | ICD-10-CM | POA: Diagnosis present

## 2017-06-07 DIAGNOSIS — N179 Acute kidney failure, unspecified: Principal | ICD-10-CM | POA: Diagnosis present

## 2017-06-07 DIAGNOSIS — Z9049 Acquired absence of other specified parts of digestive tract: Secondary | ICD-10-CM | POA: Diagnosis not present

## 2017-06-07 DIAGNOSIS — E43 Unspecified severe protein-calorie malnutrition: Secondary | ICD-10-CM | POA: Diagnosis not present

## 2017-06-07 DIAGNOSIS — R109 Unspecified abdominal pain: Secondary | ICD-10-CM | POA: Diagnosis present

## 2017-06-07 DIAGNOSIS — Z85038 Personal history of other malignant neoplasm of large intestine: Secondary | ICD-10-CM | POA: Diagnosis not present

## 2017-06-07 DIAGNOSIS — E860 Dehydration: Secondary | ICD-10-CM | POA: Diagnosis present

## 2017-06-07 DIAGNOSIS — N182 Chronic kidney disease, stage 2 (mild): Secondary | ICD-10-CM | POA: Diagnosis present

## 2017-06-07 LAB — CBC WITH DIFFERENTIAL/PLATELET
Basophils Absolute: 0 10*3/uL (ref 0.0–0.1)
Basophils Relative: 0 %
EOS ABS: 0.1 10*3/uL (ref 0.0–0.7)
EOS PCT: 0 %
HCT: 39.8 % (ref 36.0–46.0)
Hemoglobin: 13.7 g/dL (ref 12.0–15.0)
LYMPHS ABS: 2.3 10*3/uL (ref 0.7–4.0)
LYMPHS PCT: 20 %
MCH: 26 pg (ref 26.0–34.0)
MCHC: 34.4 g/dL (ref 30.0–36.0)
MCV: 75.7 fL — AB (ref 78.0–100.0)
MONO ABS: 1 10*3/uL (ref 0.1–1.0)
MONOS PCT: 9 %
Neutro Abs: 7.8 10*3/uL — ABNORMAL HIGH (ref 1.7–7.7)
Neutrophils Relative %: 71 %
PLATELETS: 329 10*3/uL (ref 150–400)
RBC: 5.26 MIL/uL — ABNORMAL HIGH (ref 3.87–5.11)
RDW: 12.6 % (ref 11.5–15.5)
WBC: 11.1 10*3/uL — ABNORMAL HIGH (ref 4.0–10.5)

## 2017-06-07 LAB — BASIC METABOLIC PANEL
Anion gap: 9 (ref 5–15)
BUN: 26 mg/dL — ABNORMAL HIGH (ref 6–20)
CALCIUM: 8.4 mg/dL — AB (ref 8.9–10.3)
CO2: 13 mmol/L — AB (ref 22–32)
Chloride: 114 mmol/L — ABNORMAL HIGH (ref 101–111)
Creatinine, Ser: 1.68 mg/dL — ABNORMAL HIGH (ref 0.44–1.00)
GFR calc Af Amer: 47 mL/min — ABNORMAL LOW (ref >60)
GFR calc non Af Amer: 41 mL/min — ABNORMAL LOW (ref >60)
GLUCOSE: 93 mg/dL (ref 65–99)
Potassium: 3.8 mmol/L (ref 3.5–5.1)
Sodium: 136 mmol/L (ref 135–145)

## 2017-06-07 LAB — URINALYSIS, ROUTINE W REFLEX MICROSCOPIC
BILIRUBIN URINE: NEGATIVE
Bacteria, UA: NONE SEEN
Glucose, UA: NEGATIVE mg/dL
Ketones, ur: NEGATIVE mg/dL
Leukocytes, UA: NEGATIVE
NITRITE: NEGATIVE
PH: 6 (ref 5.0–8.0)
Protein, ur: 30 mg/dL — AB
SPECIFIC GRAVITY, URINE: 1.008 (ref 1.005–1.030)

## 2017-06-07 LAB — COMPREHENSIVE METABOLIC PANEL
ALT: 16 U/L (ref 14–54)
ANION GAP: 10 (ref 5–15)
AST: 19 U/L (ref 15–41)
Albumin: 5 g/dL (ref 3.5–5.0)
Alkaline Phosphatase: 106 U/L (ref 38–126)
BUN: 33 mg/dL — ABNORMAL HIGH (ref 6–20)
CHLORIDE: 103 mmol/L (ref 101–111)
CO2: 15 mmol/L — ABNORMAL LOW (ref 22–32)
CREATININE: 2.1 mg/dL — AB (ref 0.44–1.00)
Calcium: 9.1 mg/dL (ref 8.9–10.3)
GFR, EST AFRICAN AMERICAN: 36 mL/min — AB (ref >60)
GFR, EST NON AFRICAN AMERICAN: 31 mL/min — AB (ref >60)
Glucose, Bld: 96 mg/dL (ref 65–99)
Potassium: 3.1 mmol/L — ABNORMAL LOW (ref 3.5–5.1)
Sodium: 128 mmol/L — ABNORMAL LOW (ref 135–145)
Total Bilirubin: 0.5 mg/dL (ref 0.3–1.2)
Total Protein: 9.1 g/dL — ABNORMAL HIGH (ref 6.5–8.1)

## 2017-06-07 LAB — LIPASE, BLOOD: LIPASE: 45 U/L (ref 11–51)

## 2017-06-07 LAB — PREGNANCY, URINE: Preg Test, Ur: NEGATIVE

## 2017-06-07 MED ORDER — DIPHENOXYLATE-ATROPINE 2.5-0.025 MG/5ML PO LIQD: 25.0000 mL | Freq: Two times a day (BID) | ORAL | Status: DC | PRN

## 2017-06-07 MED ORDER — POTASSIUM CHLORIDE CRYS ER 20 MEQ PO TBCR
40.0000 meq | EXTENDED_RELEASE_TABLET | Freq: Once | ORAL | Status: DC
  Filled 2017-06-07: qty 2

## 2017-06-07 MED ORDER — ADULT MULTIVITAMIN W/MINERALS CH
2.0000 | ORAL_TABLET | Freq: Every day | ORAL | Status: DC
  Filled 2017-06-07: qty 2

## 2017-06-07 MED ORDER — VITAMIN D (ERGOCALCIFEROL) 1.25 MG (50000 UNIT) PO CAPS: 50000.0000 [IU] | ORAL_CAPSULE | ORAL | Status: DC

## 2017-06-07 MED ORDER — CYANOCOBALAMIN 1000 MCG/ML IJ SOLN: 1000.0000 ug | INTRAMUSCULAR | Status: DC

## 2017-06-07 MED ORDER — ONDANSETRON HCL 4 MG PO TABS: 4.0000 mg | ORAL_TABLET | Freq: Three times a day (TID) | ORAL | Status: DC | PRN

## 2017-06-07 MED ORDER — LOPERAMIDE HCL 1 MG/5ML PO LIQD
3.0000 mg | Freq: Three times a day (TID) | ORAL | Status: DC | PRN
  Filled 2017-06-07: qty 15

## 2017-06-07 MED ORDER — ONDANSETRON HCL 4 MG/2ML IJ SOLN
4.0000 mg | Freq: Four times a day (QID) | INTRAMUSCULAR | Status: DC | PRN
  Administered 2017-06-07: 4 mg via INTRAVENOUS
  Filled 2017-06-07: qty 2

## 2017-06-07 MED ORDER — ONDANSETRON HCL 4 MG/2ML IJ SOLN
4.0000 mg | Freq: Once | INTRAMUSCULAR | Status: AC
  Administered 2017-06-07: 4 mg via INTRAVENOUS
  Filled 2017-06-07: qty 2

## 2017-06-07 MED ORDER — HYDROMORPHONE HCL 1 MG/ML IJ SOLN
1.0000 mg | Freq: Once | INTRAMUSCULAR | Status: AC
  Administered 2017-06-07: 1 mg via INTRAVENOUS
  Filled 2017-06-07: qty 1

## 2017-06-07 MED ORDER — CYCLOBENZAPRINE HCL 5 MG PO TABS
5.0000 mg | ORAL_TABLET | Freq: Three times a day (TID) | ORAL | Status: DC | PRN
  Administered 2017-06-07: 5 mg via ORAL
  Filled 2017-06-07: qty 1

## 2017-06-07 MED ORDER — ONDANSETRON HCL 4 MG PO TABS: 4.0000 mg | ORAL_TABLET | Freq: Four times a day (QID) | ORAL | Status: DC | PRN

## 2017-06-07 MED ORDER — MULTIVITAMIN GUMMIES ADULT PO CHEW: 2.0000 | CHEWABLE_TABLET | Freq: Every day | ORAL | Status: DC

## 2017-06-07 MED ORDER — POTASSIUM CHLORIDE 20 MEQ/15ML (10%) PO SOLN
40.0000 meq | Freq: Once | ORAL | Status: AC
  Administered 2017-06-07: 40 meq via ORAL
  Filled 2017-06-07: qty 30

## 2017-06-07 MED ORDER — POTASSIUM CHLORIDE 10 MEQ/100ML IV SOLN
10.0000 meq | Freq: Once | INTRAVENOUS | Status: AC
  Administered 2017-06-07: 10 meq via INTRAVENOUS
  Filled 2017-06-07: qty 100

## 2017-06-07 MED ORDER — LORAZEPAM 1 MG PO TABS
1.0000 mg | ORAL_TABLET | Freq: Three times a day (TID) | ORAL | Status: DC | PRN
  Administered 2017-06-07: 1 mg via ORAL
  Filled 2017-06-07: qty 1

## 2017-06-07 MED ORDER — SODIUM CHLORIDE 0.9 % IV BOLUS (SEPSIS)
1000.0000 mL | Freq: Once | INTRAVENOUS | Status: AC
  Administered 2017-06-07: 1000 mL via INTRAVENOUS

## 2017-06-07 MED ORDER — ENOXAPARIN SODIUM 30 MG/0.3ML ~~LOC~~ SOLN
20.0000 mg | SUBCUTANEOUS | Status: DC
  Filled 2017-06-07 (×2): qty 0.2

## 2017-06-07 MED ORDER — POTASSIUM CHLORIDE 20 MEQ/15ML (10%) PO SOLN: 26.6667 meq | Freq: Three times a day (TID) | ORAL | Status: DC

## 2017-06-07 MED ORDER — SODIUM CHLORIDE 0.9 % IV SOLN
INTRAVENOUS | Status: DC
  Administered 2017-06-07 – 2017-06-08 (×2): via INTRAVENOUS

## 2017-06-07 MED ORDER — POTASSIUM CHLORIDE 20 MEQ/15ML (10%) PO SOLN
30.0000 meq | Freq: Three times a day (TID) | ORAL | Status: DC
  Administered 2017-06-07 – 2017-06-08 (×3): 30 meq via ORAL
  Filled 2017-06-07 (×3): qty 30

## 2017-06-07 NOTE — Addendum Note (Signed)
Addendum  created 06/07/17 1342 by Montez Hageman, MD   Sign clinical note

## 2017-06-07 NOTE — Progress Notes (Addendum)
Initial Nutrition Assessment  DOCUMENTATION CODES:   Underweight  INTERVENTION:   Will assess for supplement needs at follow-up RD will continue to monitor  NUTRITION DIAGNOSIS:   Inadequate oral intake related to nausea as evidenced by per patient/family report.  GOAL:   Patient will meet greater than or equal to 90% of their needs  MONITOR:   PO intake, Supplement acceptance, Labs, Weight trends, I & O's  REASON FOR ASSESSMENT:   Consult Assessment of nutrition requirement/status  ASSESSMENT:   26 y.o. female with known history of colon cancer, status post subtotal colectomy and ileostomy, chronic hypokalemia, [facility], presented to emergency department with main concern of 1-2 days duration of progressively worsening nausea, epigastric abdominal pain. Pain is mostly throbbing and intermittent, 7/10 in severity, occasionally radiating to entire abdominal area and associated with abdominal cramps, poor oral intake. Patient explains having similar episodes in the past that resulted in dehydration and she was worried about getting too dehydrated. Patient denies chest pain, dyspnea, no cough, no fevers or chills. Patient reports noticing decreased output in ostomy bag.  Patient sleeping in room with no family at bedside. Pt on a regular diet, no PO documented yet at this time. Pt admitted for poor PO and dehydration. Reports in H&P, pt did not eat well for ~2 days PTA d/t nausea.  Patient with allergy to iron and iron supplements, will assess ability to tolerate supplements at follow-up.  Pt has lost 12 lb since 1/19 (11% wt loss x 6 months, significant for time frame). Partial NFPE completed with no findings at this time . Will assess further at follow-up when patient more awake.  Medications: KCl solution TID, IV Zofran PRN Labs reviewed: Low Na, K GFR: 31  Diet Order:  Diet regular Room service appropriate? Yes; Fluid consistency:  Thin  Skin:  Reviewed, no issues  Last BM:  PTA  Height:   Ht Readings from Last 1 Encounters:  06/07/17 5\' 1"  (1.549 m)    Weight:   Wt Readings from Last 1 Encounters:  06/07/17 96 lb 5.5 oz (43.7 kg)    Ideal Body Weight:  47.7 kg  BMI:  Body mass index is 18.2 kg/m.  Estimated Nutritional Needs:   Kcal:  1350-1550  Protein:  60-70g  Fluid:  1.6L/day  EDUCATION NEEDS:   No education needs identified at this time  Clayton Bibles, MS, RD, LDN Pager: [phone] After Hours Pager: [phone]

## 2017-06-07 NOTE — ED Provider Notes (Signed)
[place] DEPT Provider Note   CSN: [id number] Arrival date & time: 06/07/17  0174     History   Chief Complaint Chief Complaint  Patient presents with  . Nausea  . Dehydration    HPI Cindy [name] is a 26 y.o. female.  HPI   26 year old female with history of colon cancer status post subtotal colectomy and ileostomy, chronic hypokalemia presenting with complaints of feeling dehydrated. Patient states since yesterday she felt nauseous, having diffuse crampy abdominal pain, cramping leg pain and feeling dehydrated. She has been drinking plenty of fluid but still feels the same. Symptoms felt similar to prior bouts of dehydration that she had in the past. She ate [employer] food last night, and take it may upset her stomach. Her pain is currently rated as 8 out of 10 and diffuse. No report of fever, chills, chest pain, shortness breath, productive cough, dysuria. She did notice decreased output in her ostomy bag.  Report biological mother has hx of colon cancer.   Past Medical History:  Diagnosis Date  . Bilateral flank pain    due to ureteral stents  . Chronic hypokalemia   . CKD (chronic kidney disease), stage II   . 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 Complication of anesthesia    post op aspiration pneumonia w/ lap. appy 12-23-2007 (emergent ruptured appendix)  . FAP (familial adenomatous polyposis) 09/30/2014  . Heart murmur   . History of acute renal failure    2015;  2016  . History of fetal demise, not currently pregnant    03-22-2013  stillborn at 31 wks  . History of kidney stones   . History of sepsis    admitted 12-03-2016 (post-op day 2 w/ bilateral ureteral stents and stone manipluation) discharged 12-06-2016  for urosepsis  . Hypomagnesemia   . Ileostomy in place  [facility])   . Iron deficiency anemia   . Nephrolithiasis    BILATERAL   . Normocytic anemia   . Renal cyst, left    PER CT 11-12-2016  . Urgency of urination     Patient Active Problem List   Diagnosis Date Noted  . Sepsis, unspecified organism ([place]) 12/03/2016  . 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 Normocytic anemia 05/02/2015  . High output ileostomy ([place]) 09/30/2014  . FAP (familial adenomatous polyposis) 09/30/2014  . Nephrolithiasis 09/30/2014  . Adenocarcinoma of colon ([place]) 05/26/2013  . Nausea with vomiting 05/21/2013  . Hypokalemia 04/25/2013    Past Surgical History:  Procedure Laterality Date  . COLONOSCOPY N/A 05/24/2013   Procedure: COLONOSCOPY;  Surgeon: Missy Sabins, MD;  Location: [place];  Service: Endoscopy;  Laterality: N/A;  . COMPLETION PROTECTOMY AND REVISION ILEOSTOMY/ LYSIS ADHESIONS  07-28-2014    [facility]  . CYSTOSCOPY W/ RETROGRADES Right 12/15/2016   Procedure: CYSTOSCOPY WITH RETROGRADE PYELOGRAM;  Surgeon: Alexis Frock, MD;  Location: [facility];  Service: Urology;  Laterality: Right;  . CYSTOSCOPY W/ URETERAL STENT PLACEMENT Bilateral 11/01/2016   Procedure: CYSTOSCOPY WITH BILATERAL RETROGRADE PYELOGRAM BILATERAL URETERAL STENT PLACEMENT;  Surgeon: Alexis Frock, MD;  Location: [facility];  Service: Urology;  Laterality: Bilateral;  . CYSTOSCOPY W/ URETERAL STENT PLACEMENT Right 12/15/2016   Procedure: CYSTOSCOPY WITH STENT REPLACEMENT;  Surgeon: Alexis Frock, MD;  Location: [facility];  Service: Urology;  Laterality: Right;  . CYSTOSCOPY W/ URETERAL STENT REMOVAL Bilateral 12/15/2016   Procedure: CYSTOSCOPY WITH STENT REMOVAL;  Surgeon: Alexis Frock, MD;  Location: [facility];  Service: Urology;  Laterality: Bilateral;  . CYSTOSCOPY WITH URETEROSCOPY Bilateral 12/01/2016   Procedure: FIRST STAGE CYSTOSCOPY WITH URETEROSCOPY,  STONE MANIPULATION and BASKETRY, STENT EXCHANGE;  Surgeon:  Alexis Frock, MD;  Location: [facility]  Service: Urology;  Laterality: Bilateral;  . CYSTOSCOPY WITH URETEROSCOPY Right 12/15/2016   Procedure: SECOND STAGE -CYSTOSCOPY WITH URETEROSCOPY AND STONE EXTRACTION WITH BASKET;  Surgeon: Alexis Frock, MD;  Location: [facility];  Service: Urology;  Laterality: Right;  . HOLMIUM LASER APPLICATION Bilateral 08/04/3531   Procedure: HOLMIUM LASER APPLICATION;  Surgeon: Alexis Frock, MD;  Location: [facility];  Service: Urology;  Laterality: Bilateral;  . HOLMIUM LASER APPLICATION Right 9/92/4268   Procedure: HOLMIUM LASER APPLICATION;  Surgeon: Alexis Frock, MD;  Location: [facility];  Service: Urology;  Laterality: Right;  . I&D EXTREMITY Left 11/14/2013   Procedure: IRRIGATION AND DEBRIDEMENT LEFT LONG FINGER;  Surgeon: Tennis Must, MD;  Location: [place];  Service: Orthopedics;  Laterality: Left;  . IM NAILING TIBIA Left 08/23/2010  . LAPAROSCOPIC APPENDECTOMY  12/23/2007  . LEG RECONSTRUCTION USING FASCIAL FLAP  ~ 2009  . SUBTOTAL COLECTOMY /  RESECTION ENBLOC DISTAL ILEUM/  ILEOSTOMY  07/ 2014  [facility]  . TRANSTHORACIC ECHOCARDIOGRAM  11/11/2011   ef 55-60%/  mild MR/  trivial PR and TR/  PASP 29-51mmHg/  trivial pericardial effusion identified    OB History    Gravida Para Term Preterm AB Living   1 1 0 1 0     SAB TAB Ectopic Multiple Live Births   0 0 0           Home Medications    Prior to Admission medications   Medication Sig Start Date End Date Taking? Authorizing Provider  ciprofloxacin (CIPRO) 500 MG tablet Take 1 tablet (500 mg total) by mouth 2 (two) times daily. 12/15/16   Alexis Frock, MD  citalopram (CELEXA) 10 MG tablet Take 10 mg by mouth at bedtime.    [provider]  cyanocobalamin (,VITAMIN B-12,) 1000 MCG/ML injection Inject 1,000 mcg into the muscle every 30 (thirty) days.    [provider]  LORazepam (ATIVAN)  1 MG tablet Take 1 mg by mouth 3 (three) times daily as needed for anxiety.    [provider]  magnesium oxide (MAG-OX) 400 (241.3 Mg) MG tablet Take 400 mg by mouth daily.    [provider]  Multiple Vitamins-Minerals (MULTIVITAMIN GUMMIES ADULT) CHEW Chew 1 each by mouth daily.     [provider]  ondansetron (ZOFRAN ODT) 4 MG disintegrating tablet Take 1 tablet (4 mg total) by mouth every 8 (eight) hours as needed for nausea. 12/15/16   Alexis Frock, MD  oxyCODONE-acetaminophen (ROXICET) 5-325 MG tablet Take 1-2 tablets by mouth every 6 (six) hours as needed for moderate pain or severe pain. Post-operatively. 12/15/16   Alexis Frock, MD  potassium chloride 20 MEQ/15ML (10%) SOLN Take 20 mLs by mouth 4 (four) times daily.     [provider]  Vitamin D, Ergocalciferol, (DRISDOL) 50000 units CAPS capsule Take 50,000 Units by mouth every 7 (seven) days. ON [facility] 05/22/16   [provider]    Family History Family History  Problem Relation Age of Onset  . Adopted:  Yes  . ALS Mother   . Alcohol abuse Neg Hx   . Arthritis Neg Hx   . Asthma Neg Hx   . Birth defects Neg Hx   . Cancer Neg Hx   . COPD Neg Hx   . Depression Neg Hx   . Diabetes Neg Hx   . Drug abuse Neg Hx   . Early death Neg Hx   . Hearing loss Neg Hx   . Heart disease Neg Hx   . Hyperlipidemia Neg Hx   . Hypertension Neg Hx   . Kidney disease Neg Hx   . Learning disabilities Neg Hx   . Mental illness Neg Hx   . Mental retardation Neg Hx   . Miscarriages / Stillbirths Neg Hx   . Stroke Neg Hx   . Vision loss Neg Hx     Social History Social History  Substance Use Topics  . Smoking status: Never Smoker  . Smokeless tobacco: Never Used  . Alcohol use No     Allergies   Venofer [ferric oxide]; Soap; Food; Morphine and related; Sulfamethoxazole-trimethoprim; Iron; and Levaquin [levofloxacin]   Review of Systems Review of Systems  All other systems  reviewed and are negative.    Physical Exam Updated Vital Signs BP 99/74 (BP Location: Right Arm)   Pulse 84   Temp 98 F (36.7 C) (Oral)   Resp 18   SpO2 100%   Physical Exam  Constitutional: She appears well-developed and well-nourished. No distress.  HENT:  Head: Atraumatic.  Eyes: Conjunctivae are normal.  Neck: Neck supple.  Cardiovascular: Normal rate and regular rhythm.   Pulmonary/Chest: Effort normal and breath sounds normal.  Abdominal: She exhibits no distension. There is tenderness (Mild diffuse abdominal tenderness without guarding or rebound tenderness.).  Ostomy bag on the right side of the abdomen with normal stoma, normal-appearing output.  Neurological: She is alert.  Skin: No rash noted.  Psychiatric: She has a normal mood and affect.  Nursing note and vitals reviewed.    ED Treatments / Results  Labs (all labs ordered are listed, but only abnormal results are displayed) Labs Reviewed  CBC WITH DIFFERENTIAL/PLATELET - Abnormal; Notable for the following:       Result Value   WBC 11.1 (*)    RBC 5.26 (*)    MCV 75.7 (*)    Neutro Abs 7.8 (*)    All other components within normal limits  COMPREHENSIVE METABOLIC PANEL - Abnormal; Notable for the following:    Sodium 128 (*)    Potassium 3.1 (*)    CO2 15 (*)    BUN 33 (*)    Creatinine, Ser 2.10 (*)    Total Protein 9.1 (*)    GFR calc non Af Amer 31 (*)    GFR calc Af Amer 36 (*)    All other components within normal limits  URINALYSIS, ROUTINE W REFLEX MICROSCOPIC - Abnormal; Notable for the following:    Color, Urine STRAW (*)    Hgb urine dipstick SMALL (*)    Protein, ur 30 (*)    Squamous Epithelial / LPF 0-5 (*)    All other components within normal limits  LIPASE, BLOOD  PREGNANCY, URINE    EKG  EKG Interpretation None       Radiology No results found.  Procedures Procedures (including critical care time)  Medications Ordered in ED Medications  sodium chloride 0.9 %  bolus 1,000 mL (not administered)  ondansetron (ZOFRAN) injection 4 mg (  not administered)  HYDROmorphone (DILAUDID) injection 1 mg (not administered)     Initial Impression / Assessment and Plan / ED Course  I have reviewed the triage vital signs and the nursing notes.  Pertinent labs & imaging results that were available during my care of the patient were reviewed by me and considered in my medical decision making (see chart for details).     BP 97/69   Pulse 77   Temp 98 F (36.7 C) (Oral)   Resp 16   SpO2 100%    Final Clinical Impressions(s) / ED Diagnoses   Final diagnoses:  Dehydration  Hypokalemia    New Prescriptions New Prescriptions   No medications on file   7:32 AM The patient did complain of crampy leg pain and abdominal pain and feeling dehydrated. History of hypokalemia in the past and multiple similar episodes. Has history of colon cancer. Workup initiated, will provide IV fluid, antinausea medication and pain medication.  9:44 AM Labs shown evidence of dehydration. Patient's baseline hemoglobin is usually around 8 however today is 13.7 this is likely reflecting of hemoconcentration. Her potassium level is 3.1, will give supplementation. CO2 is 15, sodium is 128. Creatinine is 2.1, elevated from baseline. Patient is currently receiving IV fluid. Plan to consult for admission for further management of her dehydration state. This is likely secondary to colonic high output.  10:04 AM Appreciate consultation from [facility] Dr. Doyle Askew who agrees to see and admit pt for further management.  Pt agrees with plan.     Domenic Moras, PA-C 06/07/17 1006    Duffy Bruce, MD 06/08/17 1749    Duffy Bruce, MD 06/08/17 4496    Duffy Bruce, MD 06/08/17 [phone]

## 2017-06-07 NOTE — Progress Notes (Signed)
Called & rec'd report from ED RN ca. [phone].

## 2017-06-07 NOTE — Anesthesia Postprocedure Evaluation (Signed)
Anesthesia Post Note  Patient: Cindy [name]  Procedure(s) Performed: Procedure(s) (LRB): FIRST STAGE CYSTOSCOPY WITH URETEROSCOPY,  STONE MANIPULATION and BASKETRY, STENT EXCHANGE (Bilateral) HOLMIUM LASER APPLICATION (Bilateral)     Anesthesia Post Evaluation  Last Vitals:  Vitals:   12/01/16 1845 12/01/16 1920  BP: 112/68 (!) 107/59  Pulse: (!) 101 (!) 101  Resp: 16 16  Temp:  37.1 C    Last Pain:  Vitals:   12/04/16 0940  TempSrc:   PainSc: 4                  Montez Hageman

## 2017-06-07 NOTE — H&P (Addendum)
History and Physical    Cindy [name] YQI:[id number] DOB: [birth year] DOA: 06/07/2017  Referring MD/NP/PA: Berneice Gandy  PCP: Nicola Girt, DO   Outpatient Specialists: Oncologist at [facility] is Dr. Morton Stall  Patient coming from: Home  Chief Complaint: Dehydration, poor oral intake  HPI: Cindy [name] is a 26 y.o. female with known history of colon cancer, status post subtotal colectomy and ileostomy, chronic hypokalemia, [facility], presented to emergency department with main concern of 1-2 days duration of progressively worsening nausea, epigastric abdominal pain. Pain is mostly throbbing and intermittent, 7/10 in severity, occasionally radiating to entire abdominal area and associated with abdominal cramps, poor oral intake. Patient explains having similar episodes in the past that resulted in dehydration and she was worried about getting too dehydrated. Patient denies chest pain, dyspnea, no cough, no fevers or chills. Patient reports noticing decreased output in ostomy bag.  ED Course: In emergency department, patient noted to be hemodynamically stable, vital signs stable, blood work notable for WBC 11, sodium 128, potassium 3.1, bicarbonate 15, creatinine 2.1. TRH asked to admit for further evaluation and management.  Review of Systems:  Constitutional: Negative for fever, chills, diaphoresis HENT: Negative for ear pain, nosebleeds, congestion, facial swelling, rhinorrhea, neck pain, neck stiffness and ear discharge.   Eyes: Negative for pain, discharge, redness, itching and visual disturbance.  Respiratory: Negative for cough, choking, chest tightness, shortness of breath, wheezing and stridor.   Cardiovascular: Negative for chest pain, palpitations and leg swelling.  Gastrointestinal: Negative for abdominal distention.  Genitourinary: Negative for dysuria, urgency, frequency, hematuria, flank pain, decreased urine volume, difficulty urinating and  dyspareunia.  Musculoskeletal: Negative for back pain, joint swelling, arthralgias and gait problem.  Neurological: Negative for dizziness, tremors, seizures, syncope, facial asymmetry, speech difficulty, light-headedness, numbness and headaches.  Hematological: Negative for adenopathy. Does not bruise/bleed easily.  Psychiatric/Behavioral: Negative for hallucinations, behavioral problems, confusion, dysphoric mood, decreased concentration and agitation.   Past Medical History:  Diagnosis Date  . Bilateral flank pain    due to ureteral stents  . Chronic hypokalemia   . CKD (chronic kidney disease), stage II   . Colon cancer ([place]) DX  2014---  ONCOLOGIST AT [facility]   DX RIGHT ACSECDING CARCINOMA IN BACKGROUND FAP AND SEVERE MALNUTRITION-- Stage 2A  (pT3, N0, M0)  s/p subtotal colecotmy and completed protectomy and ileostomy w/ revision  . Complication of anesthesia    post op aspiration pneumonia w/ lap. appy 12-23-2007 (emergent ruptured appendix)  . FAP (familial adenomatous polyposis) 09/30/2014  . Heart murmur   . History of acute renal failure    2015;  2016  . History of fetal demise, not currently pregnant    03-22-2013  stillborn at 65 wks  . History of kidney stones   . History of sepsis    admitted 12-03-2016 (post-op day 2 w/ bilateral ureteral stents and stone manipluation) discharged 12-06-2016  for urosepsis  . Hypomagnesemia   . Ileostomy in place [facility])   . Iron deficiency anemia   . Nephrolithiasis    BILATERAL   . Normocytic anemia   . Renal cyst, left    PER CT 11-12-2016  . Urgency of urination     Past Surgical History:  Procedure Laterality Date  . COLONOSCOPY N/A 05/24/2013   Procedure: COLONOSCOPY;  Surgeon: Missy Sabins, MD;  Location: [facility];  Service: Endoscopy;  Laterality: N/A;  . COMPLETION PROTECTOMY AND REVISION ILEOSTOMY/ LYSIS ADHESIONS  07-28-2014  [facility]  . CYSTOSCOPY W/ RETROGRADES Right 12/15/2016   Procedure: CYSTOSCOPY WITH RETROGRADE  PYELOGRAM;  Surgeon: Alexis Frock, MD;  Location: [facility];  Service: Urology;  Laterality: Right;  . CYSTOSCOPY W/ URETERAL STENT PLACEMENT Bilateral 11/01/2016   Procedure: CYSTOSCOPY WITH BILATERAL RETROGRADE PYELOGRAM BILATERAL URETERAL STENT PLACEMENT;  Surgeon: Alexis Frock, MD;  Location: [facility];  Service: Urology;  Laterality: Bilateral;  . CYSTOSCOPY W/ URETERAL STENT PLACEMENT Right 12/15/2016   Procedure: CYSTOSCOPY WITH STENT REPLACEMENT;  Surgeon: Alexis Frock, MD;  Location: [facility];  Service: Urology;  Laterality: Right;  . CYSTOSCOPY W/ URETERAL STENT REMOVAL Bilateral 12/15/2016   Procedure: CYSTOSCOPY WITH STENT REMOVAL;  Surgeon: Alexis Frock, MD;  Location: [facility];  Service: Urology;  Laterality: Bilateral;  . CYSTOSCOPY WITH URETEROSCOPY Bilateral 12/01/2016   Procedure: FIRST STAGE CYSTOSCOPY WITH URETEROSCOPY,  STONE MANIPULATION and BASKETRY, STENT EXCHANGE;  Surgeon: Alexis Frock, MD;  Location: [facility];  Service: Urology;  Laterality: Bilateral;  . CYSTOSCOPY WITH URETEROSCOPY Right 12/15/2016   Procedure: SECOND STAGE -CYSTOSCOPY WITH URETEROSCOPY AND STONE EXTRACTION WITH BASKET;  Surgeon: Alexis Frock, MD;  Location: [facility];  Service: Urology;  Laterality: Right;  . HOLMIUM LASER APPLICATION Bilateral 04/04/5637   Procedure: HOLMIUM LASER APPLICATION;  Surgeon: Alexis Frock, MD;  Location: [facility];  Service: Urology;  Laterality: Bilateral;  . HOLMIUM LASER APPLICATION Right 7/56/4332   Procedure: HOLMIUM LASER APPLICATION;  Surgeon: Alexis Frock, MD;  Location: [facility];  Service: Urology;  Laterality: Right;  . I&D EXTREMITY Left 11/14/2013   Procedure: IRRIGATION AND DEBRIDEMENT LEFT LONG FINGER;  Surgeon: Tennis Must, MD;  Location: [facility];  Service: Orthopedics;  Laterality: Left;  . IM NAILING TIBIA Left  08/23/2010  . LAPAROSCOPIC APPENDECTOMY  12/23/2007  . LEG RECONSTRUCTION USING FASCIAL FLAP  ~ 2009  . SUBTOTAL COLECTOMY /  RESECTION ENBLOC DISTAL ILEUM/  ILEOSTOMY  07/ 2014  [facility]  . TRANSTHORACIC ECHOCARDIOGRAM  11/11/2011   ef 55-60%/  mild MR/  trivial PR and TR/  PASP 29-73mmHg/  trivial pericardial effusion identified   Social history:  reports that she has never smoked. She has never used smokeless tobacco. She reports that she does not drink alcohol or use drugs.  Allergies  Allergen Reactions  . Venofer [Ferric Oxide] Anaphylaxis  . Soap Hives, Itching and Other (See Comments)     allergic to [employer].    . Food Hives and Other (See Comments)     allergic to orange juice.   . Morphine And Related Swelling    When given IV, swelling around site  . Sulfamethoxazole-Trimethoprim Nausea And Vomiting    Patient developed AKI that appears to have been from bactrim. She might be able to take again if watched closely and it is absolutely needed - ie there was no anaphylaxis or allergic rash  . Iron Palpitations  . Levaquin [Levofloxacin] Hives    Family History  Problem Relation Age of Onset  . Adopted: Yes  . ALS Mother   . Alcohol abuse Neg Hx   . Arthritis Neg Hx   . Asthma Neg Hx   . Birth defects Neg Hx   . Cancer Neg Hx   . COPD Neg Hx   . Depression Neg Hx   . Diabetes Neg Hx   . Drug abuse Neg Hx   . Early death Neg Hx   . Hearing  loss Neg Hx   . Heart disease Neg Hx   . Hyperlipidemia Neg Hx   . Hypertension Neg Hx   . Kidney disease Neg Hx   . Learning disabilities Neg Hx   . Mental illness Neg Hx   . Mental retardation Neg Hx   . Miscarriages / Stillbirths Neg Hx   . Stroke Neg Hx   . Vision loss Neg Hx     Prior to Admission medications   Medication Sig Start Date End Date Taking? Authorizing Provider  cyanocobalamin (,VITAMIN B-12,) 1000 MCG/ML injection Inject 1,000 mcg into the muscle every 30 (thirty) days.   Yes [provider]    cyclobenzaprine (FLEXERIL) 5 MG tablet Take 5 mg by mouth every 8 (eight) hours as needed for migraine. 05/28/17  Yes [provider]  diphenoxylate-atropine (LOMOTIL) 2.5-0.025 MG/5ML liquid Take 25 mLs by mouth 2 (two) times daily as needed for diarrhea or loose stools.   Yes [provider]  loperamide (IMODIUM) 1 MG/5ML solution Take 3 mg by mouth 3 (three) times daily as needed for diarrhea or loose stools.   Yes [provider]  LORazepam (ATIVAN) 1 MG tablet Take 1 mg by mouth 3 (three) times daily as needed for anxiety.   Yes [provider]  MAGNESIUM PO Take 1 tablet by mouth daily.   Yes [provider]  Multiple Vitamins-Minerals (MULTIVITAMIN GUMMIES ADULT) CHEW Chew 2 each by mouth daily.    Yes [provider]  ondansetron (ZOFRAN) 4 MG tablet Take 4 mg by mouth every 8 (eight) hours as needed for nausea or vomiting.   Yes [provider]  potassium chloride 20 MEQ/15ML (10%) SOLN Take 20 mLs by mouth 3 (three) times daily.    Yes [provider]  Vitamin D, Ergocalciferol, (DRISDOL) 50000 units CAPS capsule Take 50,000 Units by mouth every Monday.  05/22/16  Yes [provider]    Physical Exam: Vitals:   06/07/17 0719 06/07/17 0900  BP: 99/74 97/69  Pulse: 84 77  Resp: 18 16  Temp: 98 F (36.7 C)   TempSrc: Oral   SpO2: 100% 100%    Constitutional: NAD, calm, comfortable Vitals:   06/07/17 0719 06/07/17 0900  BP: 99/74 97/69  Pulse: 84 77  Resp: 18 16  Temp: 98 F (36.7 C)   TempSrc: Oral   SpO2: 100% 100%   Eyes: PERRL, lids and conjunctivae normal ENMT: Mucous membranes are Dry. Posterior pharynx clear of any exudate or lesions.Normal dentition.  Neck: normal, supple, no masses, no thyromegaly Respiratory: clear to auscultation bilaterally, no wheezing, no crackles. Normal respiratory effort. No accessory muscle use.  Cardiovascular: Regular rate and rhythm, no murmurs / rubs /  gallops. No extremity edema. 2+ pedal pulses. No carotid bruits.  Abdomen:  Tenderness in epigastric area, ostomy bag present Musculoskeletal: no clubbing / cyanosis. No joint deformity upper and lower extremities. Good ROM, no contractures. Normal muscle tone.  Skin: no rashes, lesions, ulcers. No induration Neurologic: CN 2-12 grossly intact. Sensation intact, DTR normal. Strength 5/5 in all 4.  Psychiatric: Normal judgment and insight. Alert and oriented x 3. Normal mood.   Labs on Admission: I have personally reviewed following labs and imaging studies  CBC:  Recent Labs Lab 06/07/17 0727  WBC 11.1*  NEUTROABS 7.8*  HGB 13.7  HCT 39.8  MCV 75.7*  PLT 371   Basic Metabolic Panel:  Recent Labs Lab 06/07/17 0727  NA 128*  K 3.1*  CL  103  CO2 15*  GLUCOSE 96  BUN 33*  CREATININE 2.10*  CALCIUM 9.1   GFR: CrCl cannot be calculated (Unknown ideal weight.). Liver Function Tests:  Recent Labs Lab 06/07/17 0727  AST 19  ALT 16  ALKPHOS 106  BILITOT 0.5  PROT 9.1*  ALBUMIN 5.0    Recent Labs Lab 06/07/17 0727  LIPASE 45   Urine analysis:    Component Value Date/Time   COLORURINE STRAW (A) 06/07/2017 0808   APPEARANCEUR CLEAR 06/07/2017 0808   LABSPEC 1.008 06/07/2017 0808   PHURINE 6.0 06/07/2017 0808   GLUCOSEU NEGATIVE 06/07/2017 0808   HGBUR SMALL (A) 06/07/2017 0808   BILIRUBINUR NEGATIVE 06/07/2017 0808   KETONESUR NEGATIVE 06/07/2017 0808   PROTEINUR 30 (A) 06/07/2017 0808   UROBILINOGEN 0.2 07/29/2015 0320   NITRITE NEGATIVE 06/07/2017 0808   LEUKOCYTESUR NEGATIVE 06/07/2017 0808   Radiological Exams on Admission: No results found.  EKG: Pending  Assessment/Plan Active Problems:   Abdominal pain, mostly epigastric associated with nausea and poor oral intake in patient with known history of colon cancer - Unclear etiology but per patient appears this is of somewhat chronic in etiology with episodes of flareups - No specific triggering  factor identified - I do agree with admission to medical unit - Provide IV fluids and correct electrolytes - I will repeat BMP to make sure electrolytes are stabilizing    Leukocytosis - Likely related to the above, no specific bacterial specie identified - We'll hold off on antibiotics with close monitoring of vital signs and blood work as well as clinical status - Repeat CBC in the morning    Hyponatremia - Prerenal - Provide IV fluids and repeat BMP in the morning    Acute kidney injury - Also prerenal in the setting of the above - Monitor response with IV fluids    Hypokalemia - Chronic, continue to supplement    Severe PCM    DVT prophylaxis: Lovenox Code Status: Full Family Communication: Pt updated at bedside Disposition Plan: Patient will go home once medically stable Consults called: None Admission status: Inpatient  Faye Ramsay MD [facility] Pager [phone]  If 7PM-7AM, please contact night-coverage [email or web address] Password [email or web address]  06/07/2017, 11:14 AM

## 2017-06-07 NOTE — ED Notes (Signed)
Pt is aware that a urine sample is needed but is unable to provide one at this time 

## 2017-06-07 NOTE — ED Triage Notes (Signed)
Pt complains of being nausea and dehydrated since yesterday

## 2017-06-08 DIAGNOSIS — N179 Acute kidney failure, unspecified: Principal | ICD-10-CM

## 2017-06-08 DIAGNOSIS — E876 Hypokalemia: Secondary | ICD-10-CM | POA: Diagnosis not present

## 2017-06-08 DIAGNOSIS — R109 Unspecified abdominal pain: Secondary | ICD-10-CM | POA: Diagnosis not present

## 2017-06-08 DIAGNOSIS — E860 Dehydration: Secondary | ICD-10-CM | POA: Diagnosis not present

## 2017-06-08 DIAGNOSIS — K529 Noninfective gastroenteritis and colitis, unspecified: Secondary | ICD-10-CM | POA: Diagnosis not present

## 2017-06-08 LAB — BASIC METABOLIC PANEL
Anion gap: 5 (ref 5–15)
BUN: 21 mg/dL — AB (ref 6–20)
CO2: 15 mmol/L — ABNORMAL LOW (ref 22–32)
CREATININE: 1.46 mg/dL — AB (ref 0.44–1.00)
Calcium: 8.2 mg/dL — ABNORMAL LOW (ref 8.9–10.3)
Chloride: 118 mmol/L — ABNORMAL HIGH (ref 101–111)
GFR, EST AFRICAN AMERICAN: 56 mL/min — AB (ref >60)
GFR, EST NON AFRICAN AMERICAN: 48 mL/min — AB (ref >60)
Glucose, Bld: 98 mg/dL (ref 65–99)
POTASSIUM: 3.5 mmol/L (ref 3.5–5.1)
SODIUM: 138 mmol/L (ref 135–145)

## 2017-06-08 LAB — CBC
HEMATOCRIT: 30.8 % — AB (ref 36.0–46.0)
Hemoglobin: 10.3 g/dL — ABNORMAL LOW (ref 12.0–15.0)
MCH: 26.3 pg (ref 26.0–34.0)
MCHC: 33.4 g/dL (ref 30.0–36.0)
MCV: 78.6 fL (ref 78.0–100.0)
PLATELETS: 257 10*3/uL (ref 150–400)
RBC: 3.92 MIL/uL (ref 3.87–5.11)
RDW: 13.1 % (ref 11.5–15.5)
WBC: 8.4 10*3/uL (ref 4.0–10.5)

## 2017-06-08 LAB — HIV ANTIBODY (ROUTINE TESTING W REFLEX): HIV SCREEN 4TH GENERATION: NONREACTIVE

## 2017-06-08 NOTE — Progress Notes (Signed)
Discharge instructions reviewed with patient and patient verbalized understanding. Patient discharged via private vehicle with boyfriend.

## 2017-06-08 NOTE — Discharge Summary (Signed)
Physician Discharge Summary  Cindy [name]  WJX:[id number]  DOB: [birth year]  DOA: 06/07/2017 PCP: Nicola Girt, DO  Admit date: 06/07/2017 Discharge date: 06/08/2017  Admitted From: Home  Disposition:  Home   Recommendations for Outpatient Follow-up:  1. Follow up with PCP in 1 week 2. Please obtain BMP/CBC in one week 3. Encourage oral hydration   Home Health: None  Equipment/Devices: None    Discharge Condition: Stable   CODE STATUS: Full   Diet recommendation:  Regular   Brief/Interim Summary: Cindy [name] is a 26 y.o. female with known history of colon cancer, status post subtotal colectomy and ileostomy, chronic hypokalemia, [facility], presented to emergency department with main concern of 1-2 days duration of progressively worsening nausea, epigastric abdominal pain. Patient was found to have acute renal failure due to poor oral intake and acute gastroenteritis. Patient was given IVF, electrolytes were repleted and sCr significantly improved. Patient is tolerating diet well, abdominal pain has resolved and electrolytes are improved. Given clinical improvement patient will be d/c home to follow up with PCP in 3-5 days.   Subjective: Patient seen and examined on the day of discharge. Patient doing well, had breakfast this morning. Denies any abdominal pain, nausea, vomiting, changes in stools. No chest pain or SOB.   Discharge Diagnoses/Hospital Course:   Acute gastroenteritis - Abdominal pain, mostly epigastric associated with nausea and poor oral intake in patient with known history of colon cancer - Abdominal pain has resolved  - Patient tolerating diet well  - Afebrile, no leukocytosis.  - Encourage oral hydration  - No changes on her home GI medications were made  - Follow up with PCP    Acute kidney injury - Cr on admission 2.10 - Prerenal from dehydration  - Treated with IVF  - sCr improved significantly, upon discharge  1.46 - Repeat BMP in 1 week     Leukocytosis - Reactive due to dehydration  - Resolved  - Repeat CBC in 1 week     Hyponatremia - Prerenal  - Resolved with IVF     Hypokalemia - Chronic, no changes in medication were made  - Potassium supplements were continued during hospital stay.   On the day of the discharge the patient's vitals were stable, and no other acute medical condition were reported by patient. Patient was felt safe to be discharge to home   Discharge Instructions  You were cared for by a hospitalist during your hospital stay. If you have any questions about your discharge medications or the care you received while you were in the hospital after you are discharged, you can call the unit and asked to speak with the hospitalist on call if the hospitalist that took care of you is not available. Once you are discharged, your primary care physician will handle any further medical issues. Please note that NO REFILLS for any discharge medications will be authorized once you are discharged, as it is imperative that you return to your primary care physician (or establish a relationship with a primary care physician if you do not have one) for your aftercare needs so that they can reassess your need for medications and monitor your lab values.  Discharge Instructions    Call MD for:  difficulty breathing, headache or visual disturbances    Complete by:  As directed    Call MD for:  extreme fatigue    Complete by:  As directed    Call MD  for:  hives    Complete by:  As directed    Call MD for:  persistant dizziness or light-headedness    Complete by:  As directed    Call MD for:  persistant nausea and vomiting    Complete by:  As directed    Call MD for:  redness, tenderness, or signs of infection (pain, swelling, redness, odor or green/yellow discharge around incision site)    Complete by:  As directed    Call MD for:  severe uncontrolled pain    Complete by:  As directed     Call MD for:  temperature >100.4    Complete by:  As directed    Diet general    Complete by:  As directed    Discharge instructions    Complete by:  As directed    Follow up with PCP in 1 week Please obtain BMP/CBC in one week Encourage oral hydration   Increase activity slowly    Complete by:  As directed      Allergies as of 06/08/2017      Reactions   Venofer [ferric Oxide] Anaphylaxis   Soap Hives, Itching, Other (See Comments)    allergic to [employer].     Food Hives, Other (See Comments)    allergic to orange juice.    Morphine And Related Swelling   When given IV, swelling around site   Sulfamethoxazole-trimethoprim Nausea And Vomiting   Patient developed AKI that appears to have been from bactrim. She might be able to take again if watched closely and it is absolutely needed - ie there was no anaphylaxis or allergic rash   Iron Palpitations   Levaquin [levofloxacin] Hives      Medication List    STOP taking these medications   MAGNESIUM PO     TAKE these medications   cyanocobalamin 1000 MCG/ML injection Commonly known as:  (VITAMIN B-12) Inject 1,000 mcg into the muscle every 30 (thirty) days.   cyclobenzaprine 5 MG tablet Commonly known as:  FLEXERIL Take 5 mg by mouth every 8 (eight) hours as needed for migraine.   diphenoxylate-atropine 2.5-0.025 MG/5ML liquid Commonly known as:  LOMOTIL Take 25 mLs by mouth 2 (two) times daily as needed for diarrhea or loose stools.   loperamide 1 MG/5ML solution Commonly known as:  IMODIUM Take 3 mg by mouth 3 (three) times daily as needed for diarrhea or loose stools.   LORazepam 1 MG tablet Commonly known as:  ATIVAN Take 1 mg by mouth 3 (three) times daily as needed for anxiety.   MULTIVITAMIN GUMMIES ADULT Chew Chew 2 each by mouth daily.   ondansetron 4 MG tablet Commonly known as:  ZOFRAN Take 4 mg by mouth every 8 (eight) hours as needed for nausea or vomiting.   potassium chloride 20 MEQ/15ML (10%)  Soln Take 20 mLs by mouth 3 (three) times daily.   Vitamin D (Ergocalciferol) 50000 units Caps capsule Commonly known as:  DRISDOL Take 50,000 Units by mouth every Monday.       Allergies  Allergen Reactions  . Venofer [Ferric Oxide] Anaphylaxis  . Soap Hives, Itching and Other (See Comments)     allergic to [employer].    . Food Hives and Other (See Comments)     allergic to orange juice.   . Morphine And Related Swelling    When given IV, swelling around site  . Sulfamethoxazole-Trimethoprim Nausea And Vomiting    Patient developed AKI that appears to  have been from bactrim. She might be able to take again if watched closely and it is absolutely needed - ie there was no anaphylaxis or allergic rash  . Iron Palpitations  . Levaquin [Levofloxacin] Hives    Consultations:  None    Procedures/Studies: No results found.   Discharge Exam: Vitals:   06/08/17 0428 06/08/17 0433  BP: (!) 91/45 (!) 102/53  Pulse: 89 75  Resp: 16   Temp: 97.8 F (36.6 C)    Vitals:   06/07/17 1100 06/07/17 2019 06/08/17 0428 06/08/17 0433  BP: 103/64 (!) 102/57 (!) 91/45 (!) 102/53  Pulse: 86 71 89 75  Resp: 18 16 16    Temp: 97.6 F (36.4 C) 97.8 F (36.6 C) 97.8 F (36.6 C)   TempSrc: Oral Oral Oral   SpO2: 100%  100% 100%  Weight: 43.7 kg (96 lb 5.5 oz)     Height: 5\' 1"  (1.549 m)       General: Pt is alert, awake, not in acute distress Cardiovascular: RRR, S1/S2 +, no rubs, no gallops Respiratory: CTA bilaterally, no wheezing, no rhonchi Abdominal: Soft, NT, ND, bowel sounds +, ostomy bag in place, full with stools  Extremities: no edema, no cyanosis   The results of significant diagnostics from this hospitalization (including imaging, microbiology, ancillary and laboratory) are listed below for reference.     Microbiology: No results found for this or any previous visit (from the past 240 hour(s)).   Labs: BNP (last 3 results) No results for input(s): BNP in the last  8760 hours. Basic Metabolic Panel:  Recent Labs Lab 06/07/17 0727 06/07/17 1450 06/08/17 0351  NA 128* 136 138  K 3.1* 3.8 3.5  CL 103 114* 118*  CO2 15* 13* 15*  GLUCOSE 96 93 98  BUN 33* 26* 21*  CREATININE 2.10* 1.68* 1.46*  CALCIUM 9.1 8.4* 8.2*   Liver Function Tests:  Recent Labs Lab 06/07/17 0727  AST 19  ALT 16  ALKPHOS 106  BILITOT 0.5  PROT 9.1*  ALBUMIN 5.0    Recent Labs Lab 06/07/17 0727  LIPASE 45   No results for input(s): AMMONIA in the last 168 hours. CBC:  Recent Labs Lab 06/07/17 0727 06/08/17 0351  WBC 11.1* 8.4  NEUTROABS 7.8*  --   HGB 13.7 10.3*  HCT 39.8 30.8*  MCV 75.7* 78.6  PLT 329 257   Cardiac Enzymes: No results for input(s): CKTOTAL, CKMB, CKMBINDEX, TROPONINI in the last 168 hours. BNP: Invalid input(s): POCBNP CBG: No results for input(s): GLUCAP in the last 168 hours. D-Dimer No results for input(s): DDIMER in the last 72 hours. Hgb A1c No results for input(s): HGBA1C in the last 72 hours. Lipid Profile No results for input(s): CHOL, HDL, LDLCALC, TRIG, CHOLHDL, LDLDIRECT in the last 72 hours. Thyroid function studies No results for input(s): TSH, T4TOTAL, T3FREE, THYROIDAB in the last 72 hours.  Invalid input(s): FREET3 Anemia work up No results for input(s): VITAMINB12, FOLATE, FERRITIN, TIBC, IRON, RETICCTPCT in the last 72 hours. Urinalysis    Component Value Date/Time   COLORURINE STRAW (A) 06/07/2017 0808   APPEARANCEUR CLEAR 06/07/2017 0808   LABSPEC 1.008 06/07/2017 0808   PHURINE 6.0 06/07/2017 0808   GLUCOSEU NEGATIVE 06/07/2017 0808   HGBUR SMALL (A) 06/07/2017 0808   BILIRUBINUR NEGATIVE 06/07/2017 0808   KETONESUR NEGATIVE 06/07/2017 0808   PROTEINUR 30 (A) 06/07/2017 0808   UROBILINOGEN 0.2 07/29/2015 0320   NITRITE NEGATIVE 06/07/2017 0808   LEUKOCYTESUR NEGATIVE 06/07/2017 4098  Sepsis Labs Invalid input(s): PROCALCITONIN,  WBC,  LACTICIDVEN Microbiology No results found for this  or any previous visit (from the past 240 hour(s)).   Time coordinating discharge: 25 minutes  SIGNED:  Chipper Oman, MD  [facility] 06/08/2017, 10:26 AM  Pager please text page via  [email or web address] Password [email or web address]

## 2017-07-19 NOTE — Addendum Note (Signed)
Addendum  created 07/19/17 0959 by Roberts Gaudy, MD   Sign clinical note

## 2017-09-02 ENCOUNTER — Inpatient Hospital Stay (HOSPITAL_COMMUNITY)
Admission: EM | Admit: 2017-09-02 | Discharge: 2017-09-04 | DRG: 683 | Disposition: A | Discharge status: Home / Self Care | Payer: Medicare Other | Attending: Internal Medicine | Admitting: Internal Medicine

## 2017-09-02 ENCOUNTER — Encounter (HOSPITAL_COMMUNITY): Payer: Self-pay | Admitting: Emergency Medicine

## 2017-09-02 DIAGNOSIS — E876 Hypokalemia: Secondary | ICD-10-CM | POA: Diagnosis present

## 2017-09-02 DIAGNOSIS — E861 Hypovolemia: Secondary | ICD-10-CM | POA: Diagnosis present

## 2017-09-02 DIAGNOSIS — D509 Iron deficiency anemia, unspecified: Secondary | ICD-10-CM | POA: Diagnosis present

## 2017-09-02 DIAGNOSIS — Z882 Allergy status to sulfonamides status: Secondary | ICD-10-CM | POA: Diagnosis not present

## 2017-09-02 DIAGNOSIS — R1084 Generalized abdominal pain: Secondary | ICD-10-CM

## 2017-09-02 DIAGNOSIS — R011 Cardiac murmur, unspecified: Secondary | ICD-10-CM | POA: Diagnosis present

## 2017-09-02 DIAGNOSIS — Z79899 Other long term (current) drug therapy: Secondary | ICD-10-CM | POA: Diagnosis not present

## 2017-09-02 DIAGNOSIS — N182 Chronic kidney disease, stage 2 (mild): Secondary | ICD-10-CM | POA: Diagnosis present

## 2017-09-02 DIAGNOSIS — E860 Dehydration: Secondary | ICD-10-CM | POA: Diagnosis present

## 2017-09-02 DIAGNOSIS — N179 Acute kidney failure, unspecified: Principal | ICD-10-CM | POA: Diagnosis present

## 2017-09-02 DIAGNOSIS — N189 Chronic kidney disease, unspecified: Secondary | ICD-10-CM | POA: Diagnosis present

## 2017-09-02 DIAGNOSIS — Z9049 Acquired absence of other specified parts of digestive tract: Secondary | ICD-10-CM

## 2017-09-02 DIAGNOSIS — Z888 Allergy status to other drugs, medicaments and biological substances status: Secondary | ICD-10-CM

## 2017-09-02 DIAGNOSIS — E871 Hypo-osmolality and hyponatremia: Secondary | ICD-10-CM | POA: Diagnosis present

## 2017-09-02 DIAGNOSIS — R000 Tachycardia, unspecified: Secondary | ICD-10-CM | POA: Diagnosis present

## 2017-09-02 DIAGNOSIS — Z85038 Personal history of other malignant neoplasm of large intestine: Secondary | ICD-10-CM | POA: Diagnosis not present

## 2017-09-02 DIAGNOSIS — Z885 Allergy status to narcotic agent status: Secondary | ICD-10-CM

## 2017-09-02 DIAGNOSIS — I9589 Other hypotension: Secondary | ICD-10-CM

## 2017-09-02 DIAGNOSIS — F419 Anxiety disorder, unspecified: Secondary | ICD-10-CM | POA: Diagnosis present

## 2017-09-02 DIAGNOSIS — R197 Diarrhea, unspecified: Secondary | ICD-10-CM | POA: Diagnosis present

## 2017-09-02 DIAGNOSIS — F329 Major depressive disorder, single episode, unspecified: Secondary | ICD-10-CM | POA: Diagnosis present

## 2017-09-02 DIAGNOSIS — C189 Malignant neoplasm of colon, unspecified: Secondary | ICD-10-CM | POA: Diagnosis not present

## 2017-09-02 DIAGNOSIS — I959 Hypotension, unspecified: Secondary | ICD-10-CM | POA: Diagnosis present

## 2017-09-02 DIAGNOSIS — Z881 Allergy status to other antibiotic agents status: Secondary | ICD-10-CM

## 2017-09-02 DIAGNOSIS — Z91048 Other nonmedicinal substance allergy status: Secondary | ICD-10-CM

## 2017-09-02 DIAGNOSIS — Z91018 Allergy to other foods: Secondary | ICD-10-CM | POA: Diagnosis not present

## 2017-09-02 DIAGNOSIS — Z87442 Personal history of urinary calculi: Secondary | ICD-10-CM | POA: Diagnosis not present

## 2017-09-02 LAB — CBC WITH DIFFERENTIAL/PLATELET
BASOS ABS: 0 10*3/uL (ref 0.0–0.1)
Basophils Relative: 0 %
EOS PCT: 1 %
Eosinophils Absolute: 0.1 10*3/uL (ref 0.0–0.7)
HCT: 38.9 % (ref 36.0–46.0)
Hemoglobin: 13.4 g/dL (ref 12.0–15.0)
LYMPHS PCT: 16 %
Lymphs Abs: 2.3 10*3/uL (ref 0.7–4.0)
MCH: 26.3 pg (ref 26.0–34.0)
MCHC: 34.4 g/dL (ref 30.0–36.0)
MCV: 76.3 fL — AB (ref 78.0–100.0)
MONO ABS: 1.3 10*3/uL — AB (ref 0.1–1.0)
Monocytes Relative: 9 %
Neutro Abs: 10.8 10*3/uL — ABNORMAL HIGH (ref 1.7–7.7)
Neutrophils Relative %: 74 %
PLATELETS: 388 10*3/uL (ref 150–400)
RBC: 5.1 MIL/uL (ref 3.87–5.11)
RDW: 14.5 % (ref 11.5–15.5)
WBC: 14.4 10*3/uL — ABNORMAL HIGH (ref 4.0–10.5)

## 2017-09-02 LAB — URINALYSIS, ROUTINE W REFLEX MICROSCOPIC
BILIRUBIN URINE: NEGATIVE
GLUCOSE, UA: NEGATIVE mg/dL
HGB URINE DIPSTICK: NEGATIVE
Ketones, ur: NEGATIVE mg/dL
Leukocytes, UA: NEGATIVE
Nitrite: NEGATIVE
PH: 6 (ref 5.0–8.0)
Protein, ur: NEGATIVE mg/dL
SPECIFIC GRAVITY, URINE: 1.005 (ref 1.005–1.030)

## 2017-09-02 LAB — COMPREHENSIVE METABOLIC PANEL
ALBUMIN: 4.6 g/dL (ref 3.5–5.0)
ALK PHOS: 90 U/L (ref 38–126)
ALT: 14 U/L (ref 14–54)
AST: 18 U/L (ref 15–41)
Anion gap: 14 (ref 5–15)
BILIRUBIN TOTAL: 0.4 mg/dL (ref 0.3–1.2)
BUN: 28 mg/dL — AB (ref 6–20)
CALCIUM: 8.9 mg/dL (ref 8.9–10.3)
CO2: 16 mmol/L — ABNORMAL LOW (ref 22–32)
CREATININE: 2.26 mg/dL — AB (ref 0.44–1.00)
Chloride: 96 mmol/L — ABNORMAL LOW (ref 101–111)
GFR calc Af Amer: 33 mL/min — ABNORMAL LOW (ref >60)
GFR calc non Af Amer: 28 mL/min — ABNORMAL LOW (ref >60)
GLUCOSE: 103 mg/dL — AB (ref 65–99)
POTASSIUM: 2.6 mmol/L — AB (ref 3.5–5.1)
Sodium: 126 mmol/L — ABNORMAL LOW (ref 135–145)
TOTAL PROTEIN: 8.5 g/dL — AB (ref 6.5–8.1)

## 2017-09-02 LAB — I-STAT CG4 LACTIC ACID, ED: Lactic Acid, Venous: 1.26 mmol/L (ref 0.5–1.9)

## 2017-09-02 LAB — I-STAT BETA HCG BLOOD, ED (MC, WL, AP ONLY)

## 2017-09-02 LAB — LIPASE, BLOOD: Lipase: 48 U/L (ref 11–51)

## 2017-09-02 MED ORDER — CENTRUM PO CHEW
1.0000 | CHEWABLE_TABLET | Freq: Every day | ORAL | Status: DC
  Administered 2017-09-03 – 2017-09-04 (×2): 1 via ORAL
  Filled 2017-09-02 (×5): qty 1

## 2017-09-02 MED ORDER — CITALOPRAM HYDROBROMIDE 20 MG PO TABS
30.0000 mg | ORAL_TABLET | Freq: Every day | ORAL | Status: DC
  Administered 2017-09-03 – 2017-09-04 (×2): 30 mg via ORAL
  Filled 2017-09-02 (×2): qty 2

## 2017-09-02 MED ORDER — MAGNESIUM OXIDE 400 (241.3 MG) MG PO TABS
400.0000 mg | ORAL_TABLET | Freq: Every day | ORAL | Status: DC
  Administered 2017-09-02 – 2017-09-03 (×2): 400 mg via ORAL
  Filled 2017-09-02 (×2): qty 1

## 2017-09-02 MED ORDER — SODIUM CHLORIDE 0.9 % IV BOLUS (SEPSIS)
1000.0000 mL | Freq: Once | INTRAVENOUS | Status: AC
  Administered 2017-09-02: 1000 mL via INTRAVENOUS

## 2017-09-02 MED ORDER — ACETAMINOPHEN 325 MG PO TABS
650.0000 mg | ORAL_TABLET | Freq: Four times a day (QID) | ORAL | Status: DC | PRN
Start: 2017-09-02 — End: 2017-09-04

## 2017-09-02 MED ORDER — PROMETHAZINE HCL 25 MG PO TABS: 12.5000 mg | ORAL_TABLET | Freq: Four times a day (QID) | ORAL | Status: DC | PRN

## 2017-09-02 MED ORDER — ACETAMINOPHEN 650 MG RE SUPP: 650.0000 mg | Freq: Four times a day (QID) | RECTAL | Status: DC | PRN

## 2017-09-02 MED ORDER — DEXTROSE-NACL 5-0.9 % IV SOLN
INTRAVENOUS | Status: DC
  Administered 2017-09-02: 23:00:00 via INTRAVENOUS

## 2017-09-02 MED ORDER — ONDANSETRON HCL 4 MG/2ML IJ SOLN
4.0000 mg | Freq: Four times a day (QID) | INTRAMUSCULAR | Status: DC | PRN
  Administered 2017-09-03: 4 mg via INTRAVENOUS
  Filled 2017-09-02: qty 2

## 2017-09-02 MED ORDER — VITAMIN D (ERGOCALCIFEROL) 1.25 MG (50000 UNIT) PO CAPS
50000.0000 [IU] | ORAL_CAPSULE | ORAL | Status: DC
  Administered 2017-09-03: 50000 [IU] via ORAL
  Filled 2017-09-02: qty 1

## 2017-09-02 MED ORDER — HYDROMORPHONE HCL 1 MG/ML IJ SOLN
0.5000 mg | Freq: Once | INTRAMUSCULAR | Status: AC
  Administered 2017-09-02: 0.5 mg via INTRAVENOUS
  Filled 2017-09-02: qty 1

## 2017-09-02 MED ORDER — HEPARIN SODIUM (PORCINE) 5000 UNIT/ML IJ SOLN
5000.0000 [IU] | Freq: Three times a day (TID) | INTRAMUSCULAR | Status: DC
  Filled 2017-09-02 (×2): qty 1

## 2017-09-02 MED ORDER — CYCLOBENZAPRINE HCL 5 MG PO TABS: 5.0000 mg | ORAL_TABLET | Freq: Three times a day (TID) | ORAL | Status: DC | PRN

## 2017-09-02 MED ORDER — POTASSIUM CHLORIDE 10 MEQ/100ML IV SOLN
10.0000 meq | INTRAVENOUS | Status: AC
  Administered 2017-09-02 – 2017-09-03 (×4): 10 meq via INTRAVENOUS
  Filled 2017-09-02 (×4): qty 100

## 2017-09-02 MED ORDER — ONDANSETRON HCL 4 MG PO TABS
4.0000 mg | ORAL_TABLET | Freq: Four times a day (QID) | ORAL | Status: DC | PRN
  Administered 2017-09-03: 4 mg via ORAL
  Filled 2017-09-02: qty 1

## 2017-09-02 MED ORDER — ONDANSETRON HCL 4 MG/2ML IJ SOLN
4.0000 mg | Freq: Once | INTRAMUSCULAR | Status: AC
  Administered 2017-09-02: 4 mg via INTRAVENOUS
  Filled 2017-09-02: qty 2

## 2017-09-02 MED ORDER — CYANOCOBALAMIN 1000 MCG/ML IJ SOLN
1000.0000 ug | INTRAMUSCULAR | Status: DC
  Filled 2017-09-02: qty 1

## 2017-09-02 MED ORDER — POTASSIUM CHLORIDE 10 MEQ/100ML IV SOLN
10.0000 meq | Freq: Once | INTRAVENOUS | Status: AC
  Administered 2017-09-02: 10 meq via INTRAVENOUS
  Filled 2017-09-02: qty 100

## 2017-09-02 MED ORDER — LORAZEPAM 1 MG PO TABS
1.0000 mg | ORAL_TABLET | Freq: Three times a day (TID) | ORAL | Status: DC | PRN
  Administered 2017-09-02: 1 mg via ORAL
  Filled 2017-09-02: qty 1

## 2017-09-02 NOTE — ED Provider Notes (Signed)
[facility] DEPT Provider Note   CSN: [id number] Arrival date & time: 09/02/17  1850     History   Chief Complaint Chief Complaint  Patient presents with  . Abdominal Pain    HPI Cindy [name] is a 26 y.o. female.  HPI  26 year old female with a significant past medical history of colon cancer status post subtotal colectomy and ileostomy being followed at oncology [facility] with no active treatment presents today with complaints of diffuse abdominal pain and increased ostomy output.  Patient notes symptoms started this morning with generalized severe sharp abdominal pain, increased ostomy output, normal color.  She notes nausea, denies any vomiting.  She reports very little urinary output today, notes she feels dehydrated.  Patient denies any fever at home.  She reports using Zofran at home which seemed to improve her symptoms.  She denies any dysuria with voiding.  Patient notes this feels similar to previous episodes of gastroenteritis that have caused increased GI losses with kidney injury and dehydration.   Past Medical History:  Diagnosis Date  . Bilateral flank pain    due to ureteral stents  . Chronic hypokalemia   . CKD (chronic kidney disease), stage II   . 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 Complication of anesthesia    post op aspiration pneumonia w/ lap. appy 12-23-2007 (emergent ruptured appendix)  . FAP (familial adenomatous polyposis) 09/30/2014  . Heart murmur   . History of acute renal failure    2015;  2016  . History of fetal demise, not currently pregnant    03-22-2013  stillborn at 83 wks  . History of kidney stones   . History of sepsis    admitted 12-03-2016 (post-op day 2 w/ bilateral ureteral stents and stone manipluation) discharged 12-06-2016  for  urosepsis  . Hypomagnesemia   . Ileostomy in place [facility])   . Iron deficiency anemia   . Nephrolithiasis    BILATERAL   . Normocytic anemia   . Renal cyst, left    PER CT 11-12-2016  . Urgency of urination     Patient Active Problem List   Diagnosis Date Noted  . AKI (acute kidney injury) ([place]) 09/02/2017  . Dehydration 06/07/2017  . Sepsis, unspecified organism ([place]) 12/03/2016  . 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 Normocytic anemia 05/02/2015  . High output ileostomy ([place]) 09/30/2014  . FAP (familial adenomatous polyposis) 09/30/2014  . Nephrolithiasis 09/30/2014  . Adenocarcinoma of colon ([place]) 05/26/2013  . Nausea with vomiting 05/21/2013  . Hypokalemia 04/25/2013    Past Surgical History:  Procedure Laterality Date  . COLONOSCOPY N/A 05/24/2013   Procedure: COLONOSCOPY;  Surgeon: Missy Sabins, MD;  Location: [place];  Service: Endoscopy;  Laterality: N/A;  . COMPLETION PROTECTOMY AND REVISION ILEOSTOMY/ LYSIS ADHESIONS  07-28-2014    [facility]  . CYSTOSCOPY W/ RETROGRADES Right 12/15/2016   Procedure: CYSTOSCOPY WITH RETROGRADE PYELOGRAM;  Surgeon: Alexis Frock, MD;  Location: [facility];  Service: Urology;  Laterality: Right;  . CYSTOSCOPY W/ URETERAL STENT PLACEMENT Bilateral 11/01/2016   Procedure: CYSTOSCOPY WITH BILATERAL RETROGRADE PYELOGRAM BILATERAL URETERAL STENT PLACEMENT;  Surgeon: Alexis Frock, MD;  Location: [facility];  Service: Urology;  Laterality: Bilateral;  . CYSTOSCOPY W/ URETERAL STENT PLACEMENT Right 12/15/2016   Procedure:  CYSTOSCOPY WITH STENT REPLACEMENT;  Surgeon: Alexis Frock, MD;  Location[facility];  Service: Urology;  Laterality: Right;  . CYSTOSCOPY W/ URETERAL STENT REMOVAL Bilateral 12/15/2016   Procedure: CYSTOSCOPY WITH STENT REMOVAL;  Surgeon: Alexis Frock, MD;  Location: [facility];  Service: Urology;  Laterality: Bilateral;  . CYSTOSCOPY WITH URETEROSCOPY Bilateral  12/01/2016   Procedure: FIRST STAGE CYSTOSCOPY WITH URETEROSCOPY,  STONE MANIPULATION and BASKETRY, STENT EXCHANGE;  Surgeon: Alexis Frock, MD;  Location: [facility];  Service: Urology;  Laterality: Bilateral;  . CYSTOSCOPY WITH URETEROSCOPY Right 12/15/2016   Procedure: SECOND STAGE -CYSTOSCOPY WITH URETEROSCOPY AND STONE EXTRACTION WITH BASKET;  Surgeon: Alexis Frock, MD;  Location: [facility];  Service: Urology;  Laterality: Right;  . HOLMIUM LASER APPLICATION Bilateral 02/01/8587   Procedure: HOLMIUM LASER APPLICATION;  Surgeon: Alexis Frock, MD;  Location: [facility];  Service: Urology;  Laterality: Bilateral;  . HOLMIUM LASER APPLICATION Right 03/28/7740   Procedure: HOLMIUM LASER APPLICATION;  Surgeon: Alexis Frock, MD;  Location: [facility];  Service: Urology;  Laterality: Right;  . I&D EXTREMITY Left 11/14/2013   Procedure: IRRIGATION AND DEBRIDEMENT LEFT LONG FINGER;  Surgeon: Tennis Must, MD;  Location: [place];  Service: Orthopedics;  Laterality: Left;  . IM NAILING TIBIA Left 08/23/2010  . LAPAROSCOPIC APPENDECTOMY  12/23/2007  . LEG RECONSTRUCTION USING FASCIAL FLAP  ~ 2009  . SUBTOTAL COLECTOMY /  RESECTION ENBLOC DISTAL ILEUM/  ILEOSTOMY  07/ 2014  [facility]  . TRANSTHORACIC ECHOCARDIOGRAM  11/11/2011   ef 55-60%/  mild MR/  trivial PR and TR/  PASP 29-74mmHg/  trivial pericardial effusion identified    OB History    Gravida Para Term Preterm AB Living   1 1 0 1 0     SAB TAB Ectopic Multiple Live Births   0 0 0           Home Medications    Prior to Admission medications   Medication Sig Start Date End Date Taking? Authorizing Provider  citalopram (CELEXA) 10 MG tablet Take 30 mg by mouth daily. 07/19/17  Yes [provider]  cyanocobalamin (,VITAMIN B-12,) 1000 MCG/ML injection Inject 1,000 mcg into the muscle every 30 (thirty) days.   Yes [provider]    cyclobenzaprine (FLEXERIL) 5 MG tablet Take 5 mg by mouth every 8 (eight) hours as needed for migraine. 05/28/17  Yes [provider]  diphenoxylate-atropine (LOMOTIL) 2.5-0.025 MG/5ML liquid Take 25 mLs by mouth 2 (two) times daily as needed for diarrhea or loose stools.   Yes [provider]  loperamide (IMODIUM) 1 MG/5ML solution Take 3 mg by mouth 3 (three) times daily as needed for diarrhea or loose stools.   Yes [provider]  LORazepam (ATIVAN) 1 MG tablet Take 1 mg by mouth 3 (three) times daily as needed for anxiety.   Yes [provider]  magnesium oxide (MAG-OX) 400 MG tablet Take 400 mg by mouth at bedtime.   Yes [provider]  Multiple Vitamins-Minerals (MULTIVITAMIN GUMMIES ADULT) CHEW Chew 2 each by mouth daily.    Yes [provider]  ondansetron (ZOFRAN) 4 MG tablet Take 4 mg by mouth every 8 (eight) hours as needed for nausea or vomiting.   Yes [provider]  potassium chloride 20 MEQ/15ML (10%) SOLN Take 20 mLs by mouth 3 (three) times daily.    Yes [provider]  promethazine (PHENERGAN) 12.5 MG  tablet Take 12.5 mg by mouth every 6 (six) hours as needed for nausea or vomiting.   Yes [provider]  Vitamin D, Ergocalciferol, (DRISDOL) 50000 units CAPS capsule Take 50,000 Units by mouth every Monday.  05/22/16  Yes [provider]    Family History Family History  Problem Relation Age of Onset  . Adopted: Yes  . ALS Mother   . Alcohol abuse Neg Hx   . Arthritis Neg Hx   . Asthma Neg Hx   . Birth defects Neg Hx   . Cancer Neg Hx   . COPD Neg Hx   . Depression Neg Hx   . Diabetes Neg Hx   . Drug abuse Neg Hx   . Early death Neg Hx   . Hearing loss Neg Hx   . Heart disease Neg Hx   . Hyperlipidemia Neg Hx   . Hypertension Neg Hx   . Kidney disease Neg Hx   . Learning disabilities Neg Hx   . Mental illness Neg Hx   . Mental retardation Neg Hx   . Miscarriages /  Stillbirths Neg Hx   . Stroke Neg Hx   . Vision loss Neg Hx     Social History Social History  Substance Use Topics  . Smoking status: Never Smoker  . Smokeless tobacco: Never Used  . Alcohol use No     Allergies   Venofer [ferric oxide]; Soap; Food; Morphine and related; Sulfa antibiotics; Sulfamethoxazole-trimethoprim; Iron; and Levaquin [levofloxacin]   Review of Systems Review of Systems  All other systems reviewed and are negative.   Physical Exam Updated Vital Signs BP 104/63   Pulse 93   Temp 98.6 F (37 C) (Oral)   Resp 18   Ht 5\' 1"  (1.549 m)   Wt 47.2 kg (104 lb)   LMP 08/27/2017   SpO2 100%   BMI 19.65 kg/m   Physical Exam  Constitutional: She is oriented to person, place, and time. She appears well-developed and well-nourished.  HENT:  Head: Normocephalic and atraumatic.  Eyes: Pupils are equal, round, and reactive to light. Conjunctivae are normal. Right eye exhibits no discharge. Left eye exhibits no discharge. No scleral icterus.  Neck: Normal range of motion. No JVD present. No tracheal deviation present.  Pulmonary/Chest: Effort normal. No stridor.  Abdominal: Bowel sounds are normal. She exhibits no distension and no mass. There is tenderness. There is no rebound and no guarding. No hernia.  Abdomen diffusely tender nonfocal  Ostomy clean with no signs of infection, green output  Neurological: She is alert and oriented to person, place, and time. Coordination normal.  Skin: Skin is warm.  Psychiatric: She has a normal mood and affect. Her behavior is normal. Judgment and thought content normal.  Nursing note and vitals reviewed.    ED Treatments / Results  Labs (all labs ordered are listed, but only abnormal results are displayed) Labs Reviewed  COMPREHENSIVE METABOLIC PANEL - Abnormal; Notable for the following:       Result Value   Sodium 126 (*)    Potassium 2.6 (*)    Chloride 96 (*)    CO2 16 (*)    Glucose, Bld 103 (*)    BUN  28 (*)    Creatinine, Ser 2.26 (*)    Total Protein 8.5 (*)    GFR calc non Af Amer 28 (*)    GFR calc Af Amer 33 (*)    All other components within normal limits  CBC  WITH DIFFERENTIAL/PLATELET - Abnormal; Notable for the following:    WBC 14.4 (*)    MCV 76.3 (*)    Neutro Abs 10.8 (*)    Monocytes Absolute 1.3 (*)    All other components within normal limits  URINALYSIS, ROUTINE W REFLEX MICROSCOPIC  LIPASE, BLOOD  I-STAT CG4 LACTIC ACID, ED  I-STAT BETA HCG BLOOD, ED ([facility], [facility], [facility] ONLY)  I-STAT CG4 LACTIC ACID, ED    EKG  EKG Interpretation None       Radiology No results found.  Procedures Procedures (including critical care time)  Medications Ordered in ED Medications  potassium chloride 10 mEq in 100 mL IVPB (10 mEq Intravenous New Bag/Given 09/02/17 2143)  HYDROmorphone (DILAUDID) injection 0.5 mg (0.5 mg Intravenous Given 09/02/17 2040)  sodium chloride 0.9 % bolus 1,000 mL (0 mLs Intravenous Stopped 09/02/17 2142)  ondansetron (ZOFRAN) injection 4 mg (4 mg Intravenous Given 09/02/17 2143)     Initial Impression / Assessment and Plan / ED Course  I have reviewed the triage vital signs and the nursing notes.  Pertinent labs & imaging results that were available during my care of the patient were reviewed by me and considered in my medical decision making (see chart for details).     Final Clinical Impressions(s) / ED Diagnoses   Final diagnoses:  AKI (acute kidney injury) ([place])  Hypokalemia  Generalized abdominal pain    Labs: Stat lactic acid, i-STAT beta-hCG, CMP, lipase, urinalysis  Imaging:  Consults: [facility]  Therapeutics: Dilaudid, Zofran, potassium, normal saline  Discharge Meds:   Assessment/Plan: 26 year old female presents today with complaints of abdominal pain this is likely secondary to gastroenteritis.  Patient has a history of the same.  She appears to be dehydrated on exam.  She is hypokalemic at 2.6, has an acute kidney injury with a  creatinine of 2.26 and a BUN of 28, GFR of 33.  Patient is not having vomiting here but ongoing ostomy output.  Recommend hospital observation for stabilization and replenishment of electrolytes, hydration for AK I.  Discussed imaging with patient, this is similar to previous flares she has had in the past, low suspicion for acute life-threatening intra-abdominal pathology no imaging will be done at this time.    New Prescriptions New Prescriptions   No medications on file     Francee Gentile 09/02/17 2206    Mesner, Corene Cornea, MD 09/03/17 [phone]

## 2017-09-02 NOTE — ED Triage Notes (Signed)
Patient reports having ileostomy r/t colon cancer. Pt c/o abdominal cramping and nausea since this am and unable to void for past 2 hours. Normal consistency coming from ileostomy.

## 2017-09-02 NOTE — H&P (Signed)
History and Physical    RHYLYNN PERDOMO DXI:[id number] DOB: [birth year] DOA: 09/02/2017  PCP: Nicola Girt, DO   Patient coming from: Home  Chief Complaint: Diarrhea.  HPI: KAMILLAH DIDONATO is a 26 y.o. 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 Patient presents with 18 hours of increase output from her ostomy bag, complicated by worsening abdominal pain. She reports increased watery output, severe intensity, no worsening or improving factors, associated with worsening abdominal pain, which was colicky nature. She had similar symptoms in the past, about every 4 months. No recent sick contacts or antibiotic exposure.  She was seen at [facility] gastrointestinal services, she had increased stool output in the past, controlled well with Imodium and Lomotil.    ED Course: Patient was found to be dehydrated, and in acute kidney injury, electrolytes were repleted, patient was started on IV fluids, and referred for admission and further evaluation.  Review of Systems:  1. General: No fevers, no chills, no weight gain or weight loss 2. ENT: No runny nose or sore throat, no hearing disturbances 3. Pulmonary: No dyspnea, cough, wheezing, or hemoptysis 4. Cardiovascular: No angina, claudication, lower extremity edema, pnd or orthopnea 5. Gastrointestinal: positive nausea, but no vomiting.  6. Hematology: No easy bruisability or frequent infections 7. Urology: No dysuria, hematuria or increased urinary frequency 8. Dermatology: No rashes. 9. Neurology: No seizures or paresthesias 10. Musculoskeletal: No joint pain or deformities  Past Medical History:  Diagnosis Date  . Bilateral flank pain    due to ureteral stents  . Chronic hypokalemia   . CKD (chronic kidney disease), stage II   . Colon cancer ([place]) DX  2014---  ONCOLOGIST AT [facility]   DX RIGHT ACSECDING CARCINOMA IN  BACKGROUND FAP AND SEVERE MALNUTRITION-- Stage 2A  (pT3, N0, M0)  s/p subtotal colecotmy and completed protectomy and ileostomy w/ revision  . Complication of anesthesia    post op aspiration pneumonia w/ lap. appy 12-23-2007 (emergent ruptured appendix)  . FAP (familial adenomatous polyposis) 09/30/2014  . Heart murmur   . History of acute renal failure    2015;  2016  . History of fetal demise, not currently pregnant    03-22-2013  stillborn at 53 wks  . History of kidney stones   . History of sepsis    admitted 12-03-2016 (post-op day 2 w/ bilateral ureteral stents and stone manipluation) discharged 12-06-2016  for urosepsis  . Hypomagnesemia   . Ileostomy in place [facility])   . Iron deficiency anemia   . Nephrolithiasis    BILATERAL   . Normocytic anemia   . Renal cyst, left    PER CT 11-12-2016  . Urgency of urination     Past Surgical History:  Procedure Laterality Date  . COLONOSCOPY N/A 05/24/2013   Procedure: COLONOSCOPY;  Surgeon: Missy Sabins, MD;  Location: [place];  Service: Endoscopy;  Laterality: N/A;  . COMPLETION PROTECTOMY AND REVISION ILEOSTOMY/ LYSIS ADHESIONS  07-28-2014    [facility]  . CYSTOSCOPY W/ RETROGRADES Right 12/15/2016   Procedure: CYSTOSCOPY WITH RETROGRADE PYELOGRAM;  Surgeon: Alexis Frock, MD;  Location: [facility];  Service: Urology;  Laterality: Right;  . CYSTOSCOPY W/ URETERAL STENT PLACEMENT Bilateral 11/01/2016   Procedure: CYSTOSCOPY WITH BILATERAL RETROGRADE PYELOGRAM BILATERAL URETERAL STENT PLACEMENT;  Surgeon: Alexis Frock, MD;  Location: [facility];  Service: Urology;  Laterality: Bilateral;  . CYSTOSCOPY W/ URETERAL STENT  PLACEMENT Right 12/15/2016   Procedure: CYSTOSCOPY WITH STENT REPLACEMENT;  Surgeon: Alexis Frock, MD;  Location: [facility];  Service: Urology;  Laterality: Right;  . CYSTOSCOPY W/ URETERAL STENT REMOVAL Bilateral 12/15/2016   Procedure: CYSTOSCOPY WITH STENT REMOVAL;  Surgeon: Alexis Frock,  MD;  Location: [facility];  Service: Urology;  Laterality: Bilateral;  . CYSTOSCOPY WITH URETEROSCOPY Bilateral 12/01/2016   Procedure: FIRST STAGE CYSTOSCOPY WITH URETEROSCOPY,  STONE MANIPULATION and BASKETRY, STENT EXCHANGE;  Surgeon: Alexis Frock, MD;  Location: [facility];  Service: Urology;  Laterality: Bilateral;  . CYSTOSCOPY WITH URETEROSCOPY Right 12/15/2016   Procedure: SECOND STAGE -CYSTOSCOPY WITH URETEROSCOPY AND STONE EXTRACTION WITH BASKET;  Surgeon: Alexis Frock, MD;  Location: [facility];  Service: Urology;  Laterality: Right;  . HOLMIUM LASER APPLICATION Bilateral 0/01/91   Procedure: HOLMIUM LASER APPLICATION;  Surgeon: Alexis Frock, MD;  Location: [facility];  Service: Urology;  Laterality: Bilateral;  . HOLMIUM LASER APPLICATION Right 02/23/761   Procedure: HOLMIUM LASER APPLICATION;  Surgeon: Alexis Frock, MD;  Location: [facility];  Service: Urology;  Laterality: Right;  . I&D EXTREMITY Left 11/14/2013   Procedure: IRRIGATION AND DEBRIDEMENT LEFT LONG FINGER;  Surgeon: Tennis Must, MD;  Location: [place];  Service: Orthopedics;  Laterality: Left;  . IM NAILING TIBIA Left 08/23/2010  . LAPAROSCOPIC APPENDECTOMY  12/23/2007  . LEG RECONSTRUCTION USING FASCIAL FLAP  ~ 2009  . SUBTOTAL COLECTOMY /  RESECTION ENBLOC DISTAL ILEUM/  ILEOSTOMY  07/ 2014  [facility]  . TRANSTHORACIC ECHOCARDIOGRAM  11/11/2011   ef 55-60%/  mild MR/  trivial PR and TR/  PASP 29-74mmHg/  trivial pericardial effusion identified     reports that she has never smoked. She has never used smokeless tobacco. She reports that she does not drink alcohol or use drugs.  Allergies  Allergen Reactions  . Venofer [Ferric Oxide] Anaphylaxis  . Soap Hives, Itching and Other (See Comments)     allergic to [employer].    . Food Hives and Other (See Comments)     allergic to orange juice.   . Morphine And Related  Swelling    When given IV, swelling around site  . Sulfa Antibiotics Other (See Comments)    dehydration  . Sulfamethoxazole-Trimethoprim Nausea And Vomiting    Patient developed AKI that appears to have been from bactrim. She might be able to take again if watched closely and it is absolutely needed - ie there was no anaphylaxis or allergic rash  . Iron Palpitations  . Levaquin [Levofloxacin] Hives    Family History  Problem Relation Age of Onset  . Adopted: Yes  . ALS Mother   . Alcohol abuse Neg Hx   . Arthritis Neg Hx   . Asthma Neg Hx   . Birth defects Neg Hx   . Cancer Neg Hx   . COPD Neg Hx   . Depression Neg Hx   . Diabetes Neg Hx   . Drug abuse Neg Hx   . Early death Neg Hx   . Hearing loss Neg Hx   . Heart disease Neg Hx   . Hyperlipidemia Neg Hx   . Hypertension Neg Hx   . Kidney disease Neg Hx   . Learning disabilities Neg Hx   . Mental illness Neg Hx   . Mental retardation Neg Hx   . Miscarriages / Stillbirths Neg Hx   . Stroke Neg Hx   .  Vision loss Neg Hx    Unacceptable: Noncontributory, unremarkable, or negative. Acceptable: Family history reviewed and not pertinent (If you reviewed it)  Prior to Admission medications   Medication Sig Start Date End Date Taking? Authorizing Provider  citalopram (CELEXA) 10 MG tablet Take 30 mg by mouth daily. 07/19/17  Yes [provider]  cyanocobalamin (,VITAMIN B-12,) 1000 MCG/ML injection Inject 1,000 mcg into the muscle every 30 (thirty) days.   Yes [provider]  cyclobenzaprine (FLEXERIL) 5 MG tablet Take 5 mg by mouth every 8 (eight) hours as needed for migraine. 05/28/17  Yes [provider]  diphenoxylate-atropine (LOMOTIL) 2.5-0.025 MG/5ML liquid Take 25 mLs by mouth 2 (two) times daily as needed for diarrhea or loose stools.   Yes [provider]  loperamide (IMODIUM) 1 MG/5ML solution Take 3 mg by mouth 3 (three) times daily as needed for diarrhea or loose stools.   Yes  [provider]  LORazepam (ATIVAN) 1 MG tablet Take 1 mg by mouth 3 (three) times daily as needed for anxiety.   Yes [provider]  magnesium oxide (MAG-OX) 400 MG tablet Take 400 mg by mouth at bedtime.   Yes [provider]  Multiple Vitamins-Minerals (MULTIVITAMIN GUMMIES ADULT) CHEW Chew 2 each by mouth daily.    Yes [provider]  ondansetron (ZOFRAN) 4 MG tablet Take 4 mg by mouth every 8 (eight) hours as needed for nausea or vomiting.   Yes [provider]  potassium chloride 20 MEQ/15ML (10%) SOLN Take 20 mLs by mouth 3 (three) times daily.    Yes [provider]  promethazine (PHENERGAN) 12.5 MG tablet Take 12.5 mg by mouth every 6 (six) hours as needed for nausea or vomiting.   Yes [provider]  Vitamin D, Ergocalciferol, (DRISDOL) 50000 units CAPS capsule Take 50,000 Units by mouth every Monday.  05/22/16  Yes [provider]    Physical Exam: Vitals:   09/02/17 1916 09/02/17 2054 09/02/17 2100 09/02/17 2130  BP:  102/71 102/65 104/63  Pulse:  98 98 93  Resp:  18  18  Temp:      TempSrc:      SpO2:  100%  100%  Weight: 47.2 kg (104 lb)     Height: 5\' 1"  (1.549 m)       Constitutional: deconditioned Vitals:   09/02/17 1916 09/02/17 2054 09/02/17 2100 09/02/17 2130  BP:  102/71 102/65 104/63  Pulse:  98 98 93  Resp:  18  18  Temp:      TempSrc:      SpO2:  100%  100%  Weight: 47.2 kg (104 lb)     Height: 5\' 1"  (1.549 m)      Eyes: PERRL, lids and conjunctivae mild pale, with no icterus. Head normocephalic, nose and ears with no deformities.  ENMT: Mucous membranes are dry. Posterior pharynx clear of any exudate or lesions.Normal dentition.  Neck: normal, supple, no masses, no thyromegaly Respiratory: clear to auscultation bilaterally, no wheezing, no crackles. Normal respiratory effort. No accessory muscle use.  Cardiovascular: Regular rate and rhythm, no murmurs / rubs / gallops. No  extremity edema. 2+ pedal pulses. No carotid bruits.  Abdomen: moderate tenderness to deep palpation, with no rebound or gurading, no masses palpated. No hepatosplenomegaly. Decreased bowel sounds positive. Ostomy bag in place with watery and formed stools, yellow-green in color, with no signs of bleeding.  Musculoskeletal: no clubbing / cyanosis. No joint deformity upper and lower extremities. Good  ROM, no contractures. Normal muscle tone.  Skin: no rashes, lesions, ulcers. No induration Neurologic: CN 2-12 grossly intact. Sensation intact, DTR normal. Strength 5/5 in all 4.     Labs on Admission: I have personally reviewed following labs and imaging studies  CBC:  Recent Labs Lab 09/02/17 2031  WBC 14.4*  NEUTROABS 10.8*  HGB 13.4  HCT 38.9  MCV 76.3*  PLT 209   Basic Metabolic Panel:  Recent Labs Lab 09/02/17 2031  NA 126*  K 2.6*  CL 96*  CO2 16*  GLUCOSE 103*  BUN 28*  CREATININE 2.26*  CALCIUM 8.9   GFR: Estimated Creatinine Clearance: 27.6 mL/min (A) (by C-G formula based on SCr of 2.26 mg/dL (H)). Liver Function Tests:  Recent Labs Lab 09/02/17 2031  AST 18  ALT 14  ALKPHOS 90  BILITOT 0.4  PROT 8.5*  ALBUMIN 4.6    Recent Labs Lab 09/02/17 2031  LIPASE 48   No results for input(s): AMMONIA in the last 168 hours. Coagulation Profile: No results for input(s): INR, PROTIME in the last 168 hours. Cardiac Enzymes: No results for input(s): CKTOTAL, CKMB, CKMBINDEX, TROPONINI in the last 168 hours. BNP (last 3 results) No results for input(s): PROBNP in the last 8760 hours. HbA1C: No results for input(s): HGBA1C in the last 72 hours. CBG: No results for input(s): GLUCAP in the last 168 hours. Lipid Profile: No results for input(s): CHOL, HDL, LDLCALC, TRIG, CHOLHDL, LDLDIRECT in the last 72 hours. Thyroid Function Tests: No results for input(s): TSH, T4TOTAL, FREET4, T3FREE, THYROIDAB in the last 72 hours. Anemia Panel: No results for  input(s): VITAMINB12, FOLATE, FERRITIN, TIBC, IRON, RETICCTPCT in the last 72 hours. Urine analysis:    Component Value Date/Time   COLORURINE YELLOW 09/02/2017 1920   APPEARANCEUR CLEAR 09/02/2017 1920   LABSPEC 1.005 09/02/2017 1920   PHURINE 6.0 09/02/2017 1920   GLUCOSEU NEGATIVE 09/02/2017 1920   HGBUR NEGATIVE 09/02/2017 1920   BILIRUBINUR NEGATIVE 09/02/2017 1920   KETONESUR NEGATIVE 09/02/2017 1920   PROTEINUR NEGATIVE 09/02/2017 1920   UROBILINOGEN 0.2 07/29/2015 0320   NITRITE NEGATIVE 09/02/2017 1920   LEUKOCYTESUR NEGATIVE 09/02/2017 1920    Radiological Exams on Admission: No results found.  EKG: Independently reviewed. NA  Assessment/Plan Active Problems:   AKI (acute kidney injury) ([place])  26 year old female with significant history for colon cancer status post resection, in the setting of familial adenomatous polyposis, presents with increase stoma output, complicated by abdominal pain. She has had this symptoms before,  that were apparently well controlled with antidiarrheal agents. On initial physical examination blood pressure 101/72, 90/57, temperature 98.6, heart rate 97 to 125, respiratory rate 18, oxygen saturation 100%. Pale conjunctivae, dry mucous membranes, lungs were clear to auscultation bilaterally, no wheezing, rales or rhonchi, heart S1-S2 present rhythmic tachycardic, no gallops, or murmurs, abdomen tender to palpation, no rebound or guarding, colostomy bag in place with watery/formed yellow/green stool, no lower extremity edema. Sodium 126, potassium 2.6, chloride 96, bicarbonate 16, glucose 103, BUN 28, creatinine 2.6, lipase 48, AST 18, ALT 14, total bilirubin 0.4, white count 14.4, hemoglobin 13.4, hematocrit 38.9, platelets 388, urine analysis negative for infection  Patient will be admitted to the hospital working diagnosis of increased ostomy output, complicated by dehydration, hypotension, acute kidney injury, hyponatremia and hypokalemia.   1.  Diarrhea in the setting of colectomy and terminal ileum resection. Will continue supportive medical care, with IV analgesics, IV antiacids, IV antiemetics and IV fluids. Low threshold for further abdominal  imaging with CT. Clear liquid diet as tolerated. Continue loperamide, 3 times daily, as needed  2. Acute kidney injury with hyponatremia and hypokalemia. This is likely due to dehydration and volume loss due to diarrhea, will continue IV fluids with crystalloids isotonic solutions at 100 mg. Will avoid hypotension or nephrotoxic agents. Potassium will be repleted with potassium chloride, intravenously. Follow-up renal panel in the morning. Check magnesium in the morning.  3. Hypotension. Likely due to volume loss in the setting of diarrhea. Will continue hydration with isotonic systolic solutions intravenously, target mean arterial pressure of 65. Use bolus normal saline if needed.   4. Anxiety/depression. Continue citalopram and lorazepam. No agitation or confusion    DVT prophylaxis: heparin Code Status: full  Family Communication: No family at the bedside  Disposition Plan: home  Consults called: None  Admission status: Inpatient.     Mauricio Gerome Apley MD [facility] Pager [phone]  If 7PM-7AM, please contact night-coverage [email or web address] Password [email or web address]  09/02/2017, 10:08 PM

## 2017-09-03 DIAGNOSIS — R197 Diarrhea, unspecified: Secondary | ICD-10-CM

## 2017-09-03 DIAGNOSIS — E871 Hypo-osmolality and hyponatremia: Secondary | ICD-10-CM

## 2017-09-03 DIAGNOSIS — N179 Acute kidney failure, unspecified: Principal | ICD-10-CM

## 2017-09-03 DIAGNOSIS — E876 Hypokalemia: Secondary | ICD-10-CM

## 2017-09-03 LAB — COMPREHENSIVE METABOLIC PANEL
ALBUMIN: 3 g/dL — AB (ref 3.5–5.0)
ALT: 11 U/L — AB (ref 14–54)
AST: 19 U/L (ref 15–41)
Alkaline Phosphatase: 60 U/L (ref 38–126)
Anion gap: 11 (ref 5–15)
BUN: 21 mg/dL — AB (ref 6–20)
CHLORIDE: 109 mmol/L (ref 101–111)
CO2: 13 mmol/L — AB (ref 22–32)
Calcium: 7.5 mg/dL — ABNORMAL LOW (ref 8.9–10.3)
Creatinine, Ser: 1.8 mg/dL — ABNORMAL HIGH (ref 0.44–1.00)
GFR calc Af Amer: 43 mL/min — ABNORMAL LOW (ref >60)
GFR, EST NON AFRICAN AMERICAN: 37 mL/min — AB (ref >60)
Glucose, Bld: 144 mg/dL — ABNORMAL HIGH (ref 65–99)
POTASSIUM: 2.7 mmol/L — AB (ref 3.5–5.1)
SODIUM: 133 mmol/L — AB (ref 135–145)
Total Bilirubin: 0.4 mg/dL (ref 0.3–1.2)
Total Protein: 5.6 g/dL — ABNORMAL LOW (ref 6.5–8.1)

## 2017-09-03 LAB — CBC
HEMATOCRIT: 29.1 % — AB (ref 36.0–46.0)
Hemoglobin: 9.9 g/dL — ABNORMAL LOW (ref 12.0–15.0)
MCH: 26.4 pg (ref 26.0–34.0)
MCHC: 34 g/dL (ref 30.0–36.0)
MCV: 77.6 fL — AB (ref 78.0–100.0)
Platelets: 240 10*3/uL (ref 150–400)
RBC: 3.75 MIL/uL — AB (ref 3.87–5.11)
RDW: 14.9 % (ref 11.5–15.5)
WBC: 9.3 10*3/uL (ref 4.0–10.5)

## 2017-09-03 LAB — MAGNESIUM: Magnesium: 1.8 mg/dL (ref 1.7–2.4)

## 2017-09-03 LAB — MRSA PCR SCREENING: MRSA BY PCR: NEGATIVE

## 2017-09-03 MED ORDER — LOPERAMIDE HCL 1 MG/5ML PO LIQD
2.0000 mg | ORAL | Status: DC | PRN
  Filled 2017-09-03: qty 10

## 2017-09-03 MED ORDER — MORPHINE SULFATE (PF) 4 MG/ML IV SOLN: 2.0000 mg | INTRAVENOUS | Status: DC | PRN

## 2017-09-03 MED ORDER — SODIUM BICARBONATE 8.4 % IV SOLN
50.0000 meq | Freq: Once | INTRAVENOUS | Status: AC
  Administered 2017-09-03: 50 meq via INTRAVENOUS
  Filled 2017-09-03 (×2): qty 50

## 2017-09-03 MED ORDER — SODIUM BICARBONATE 650 MG PO TABS
650.0000 mg | ORAL_TABLET | Freq: Two times a day (BID) | ORAL | Status: DC
  Administered 2017-09-03 – 2017-09-04 (×3): 650 mg via ORAL
  Filled 2017-09-03 (×3): qty 1

## 2017-09-03 MED ORDER — POTASSIUM CHLORIDE IN NACL 20-0.45 MEQ/L-% IV SOLN
INTRAVENOUS | Status: DC
  Administered 2017-09-03 – 2017-09-04 (×4): via INTRAVENOUS
  Filled 2017-09-03 (×4): qty 1000

## 2017-09-03 MED ORDER — POTASSIUM CHLORIDE CRYS ER 20 MEQ PO TBCR
40.0000 meq | EXTENDED_RELEASE_TABLET | ORAL | Status: AC
  Administered 2017-09-03 (×3): 40 meq via ORAL
  Filled 2017-09-03 (×3): qty 2

## 2017-09-03 NOTE — Progress Notes (Signed)
[facility] PROGRESS NOTE  ALLYE HOYOS WUX:[id number] DOB: [birth year] DOA: 09/02/2017  PCP: Nicola Girt, DO  Brief History/Interval Summary: A 26 year old female with a past medical history significant for familial adenomatous polyposis, right colon cancer, status post colectomy and terminal ileal resection in 2015 at [facility]. She presented with 18 hours of increased output from her ostomy with the worsening abdominal pain. She had some nausea but no vomiting. She was found to have electrolyte abnormalities and was found to be in renal failure. She was hospitalized for further management.  Reason for Visit: Acute renal failure. Hypokalemia. Hyponatremia  Consultants: None  Procedures: None  Antibiotics: None  Subjective/Interval History: Patient denies any fever or chills. She states that she is feeling better this morning. Her output from the ostomy bag has reduced. Abdominal pain is better. Denies any nausea.  ROS: Denies any headaches  Objective:  Vital Signs  Vitals:   09/02/17 2130 09/02/17 2200 09/02/17 2300 09/03/17 0447  BP: 104/63 100/66 (!) 90/57 97/60  Pulse: 93 98 97 (!) 101  Resp: 18  16 18   Temp:   98 F (36.7 C) 98.6 F (37 C)  TempSrc:   Oral Oral  SpO2: 100%  98% 98%  Weight:   46.7 kg (103 lb)   Height:   5\' 1"  (1.549 m)     Intake/Output Summary (Last 24 hours) at 09/03/17 1041 Last data filed at 09/03/17 0330  Gross per 24 hour  Intake             2235 ml  Output                0 ml  Net             2235 ml   Filed Weights   09/02/17 1916 09/02/17 2300  Weight: 47.2 kg (104 lb) 46.7 kg (103 lb)    General appearance: alert, cooperative, appears stated age and no distress Head: Normocephalic, without obvious abnormality, atraumatic Resp: clear to auscultation bilaterally Cardio: regular rate and rhythm, S1, S2 normal, no murmur, click, rub or gallop GI: Abdomen is soft. Nontender. Ostomy is noted with brown  stool. Bowel sounds are present. No masses or organomegaly. Extremities: extremities normal, atraumatic, no cyanosis or edema Neurologic: No focal neurological deficits  Lab Results:  Data Reviewed: I have personally reviewed following labs and imaging studies  CBC:  Recent Labs Lab 09/02/17 2031 09/03/17 0520  WBC 14.4* 9.3  NEUTROABS 10.8*  --   HGB 13.4 9.9*  HCT 38.9 29.1*  MCV 76.3* 77.6*  PLT 388 253    Basic Metabolic Panel:  Recent Labs Lab 09/02/17 2031 09/03/17 0520  NA 126* 133*  K 2.6* 2.7*  CL 96* 109  CO2 16* 13*  GLUCOSE 103* 144*  BUN 28* 21*  CREATININE 2.26* 1.80*  CALCIUM 8.9 7.5*  MG  --  1.8    GFR: Estimated Creatinine Clearance: 34.3 mL/min (A) (by C-G formula based on SCr of 1.8 mg/dL (H)).  Liver Function Tests:  Recent Labs Lab 09/02/17 2031 09/03/17 0520  AST 18 19  ALT 14 11*  ALKPHOS 90 60  BILITOT 0.4 0.4  PROT 8.5* 5.6*  ALBUMIN 4.6 3.0*     Recent Labs Lab 09/02/17 2031  LIPASE 48    Recent Results (from the past 240 hour(s))  MRSA PCR Screening     Status: None   Collection Time: 09/02/17 11:16 PM  Result Value Ref Range Status  MRSA by PCR NEGATIVE NEGATIVE Final    Comment:        The GeneXpert MRSA Assay (FDA approved for NASAL specimens only), is one component of a comprehensive MRSA colonization surveillance program. It is not intended to diagnose MRSA infection nor to guide or monitor treatment for MRSA infections.       Radiology Studies: No results found.   Medications:  Scheduled: . citalopram  30 mg Oral Daily  . [START ON 09/04/2017] cyanocobalamin  1,000 mcg Intramuscular Q30 days  . heparin  5,000 Units Subcutaneous Q8H  . magnesium oxide  400 mg Oral QHS  . multivitamin-iron-minerals-folic acid  1 tablet Oral Daily  . potassium chloride  40 mEq Oral Q4H  . Vitamin D (Ergocalciferol)  50,000 Units Oral Q Mon   Continuous: . 0.45 % NaCl with KCl 20 mEq / L 100 mL/hr at  09/03/17 0854   EKC:[id number] **OR** acetaminophen, cyclobenzaprine, loperamide, LORazepam, morphine injection, ondansetron **OR** ondansetron (ZOFRAN) IV, promethazine  Assessment/Plan:  Active Problems:   AKI (acute kidney injury) ([place])    Increased output from ostomy/diarrhea. Patient denies any fever, chills. Her abdomen is benign. She's feeling better. Etiology for her increased output from ostomy is not clear. It could be some form of gastroenteritis. No need for any imaging studies at this point in time since she has clinically improved. Continue loperamide. Lactic acid level was normal.  Acute kidney injury with hyponatremia and hypokalemia/non-anion Metabolic acidosis Renal function is improving with IV fluids. Potassium remains significantly low and will be repleted aggressively. Magnesium is 1.8. Sodium level has improved. Continue IV fluids for now. Monitor urine output. Metabolic acidosis most likely due to diarrhea. Give her sodium bicarbonate. Repeat labs tomorrow.  Hypotension. Most likely secondary to hypovolemia in the setting of diarrhea. Improved with IV fluids. Blood pressure remains borderline low. She is asymptomatic. Continue to monitor.  History of anxiety and depression. Continue with home medications. This appears to be stable.  Microcytic anemia. Drop in hemoglobin noted. This was mostly dilutional. Her baseline hemoglobin is around 10. No evidence for overt bleeding. Continue to monitor for now.  DVT Prophylaxis: Subcutaneous heparin    Code Status: Full code  Family Communication: Discussed with the patient  Disposition Plan: Management as outlined above. Mobilize.    LOS: 1 day   [place] [facility] Pager [phone] 09/03/2017, 10:41 AM  If 7PM-7AM, please contact night-coverage at [email or web address], password [facility]

## 2017-09-04 LAB — BASIC METABOLIC PANEL
ANION GAP: 6 (ref 5–15)
Anion gap: 7 (ref 5–15)
BUN: 13 mg/dL (ref 6–20)
BUN: 9 mg/dL (ref 6–20)
CHLORIDE: 110 mmol/L (ref 101–111)
CHLORIDE: 115 mmol/L — AB (ref 101–111)
CO2: 17 mmol/L — AB (ref 22–32)
CO2: 16 mmol/L — ABNORMAL LOW (ref 22–32)
Calcium: 7.9 mg/dL — ABNORMAL LOW (ref 8.9–10.3)
Calcium: 8.3 mg/dL — ABNORMAL LOW (ref 8.9–10.3)
Creatinine, Ser: 1.46 mg/dL — ABNORMAL HIGH (ref 0.44–1.00)
Creatinine, Ser: 1.43 mg/dL — ABNORMAL HIGH (ref 0.44–1.00)
GFR calc non Af Amer: 48 mL/min — ABNORMAL LOW (ref >60)
GFR calc non Af Amer: 49 mL/min — ABNORMAL LOW (ref >60)
GFR, EST AFRICAN AMERICAN: 56 mL/min — AB (ref >60)
GFR, EST AFRICAN AMERICAN: 57 mL/min — AB (ref >60)
GLUCOSE: 113 mg/dL — AB (ref 65–99)
Glucose, Bld: 94 mg/dL (ref 65–99)
Potassium: 2.7 mmol/L — CL (ref 3.5–5.1)
Potassium: 4 mmol/L (ref 3.5–5.1)
SODIUM: 134 mmol/L — AB (ref 135–145)
Sodium: 137 mmol/L (ref 135–145)

## 2017-09-04 LAB — CBC
HEMATOCRIT: 30.1 % — AB (ref 36.0–46.0)
HEMOGLOBIN: 10.1 g/dL — AB (ref 12.0–15.0)
MCH: 26 pg (ref 26.0–34.0)
MCHC: 33.6 g/dL (ref 30.0–36.0)
MCV: 77.6 fL — AB (ref 78.0–100.0)
Platelets: 244 10*3/uL (ref 150–400)
RBC: 3.88 MIL/uL (ref 3.87–5.11)
RDW: 14.8 % (ref 11.5–15.5)
WBC: 8.3 10*3/uL (ref 4.0–10.5)

## 2017-09-04 MED ORDER — POTASSIUM CHLORIDE 20 MEQ/15ML (10%) PO SOLN
40.0000 meq | ORAL | Status: DC
  Administered 2017-09-04 (×2): 40 meq via ORAL
  Filled 2017-09-04 (×2): qty 30

## 2017-09-04 MED ORDER — POTASSIUM CHLORIDE CRYS ER 20 MEQ PO TBCR
40.0000 meq | EXTENDED_RELEASE_TABLET | ORAL | Status: DC
  Filled 2017-09-04: qty 2

## 2017-09-04 MED ORDER — ONDANSETRON HCL 4 MG PO TABS: 4.0000 mg | ORAL_TABLET | Freq: Three times a day (TID) | ORAL | 0 refills | Status: DC | PRN

## 2017-09-04 MED ORDER — SODIUM BICARBONATE 650 MG PO TABS: 650.0000 mg | ORAL_TABLET | Freq: Two times a day (BID) | ORAL | 0 refills | Status: AC

## 2017-09-04 MED ORDER — POTASSIUM CHLORIDE 10 MEQ/100ML IV SOLN
10.0000 meq | INTRAVENOUS | Status: AC
  Administered 2017-09-04 (×3): 10 meq via INTRAVENOUS
  Filled 2017-09-04 (×3): qty 100

## 2017-09-04 MED ORDER — POTASSIUM CHLORIDE 20 MEQ/15ML (10%) PO SOLN: 26.6667 meq | Freq: Three times a day (TID) | ORAL | 0 refills | Status: DC

## 2017-09-04 NOTE — Progress Notes (Signed)
CRITICAL VALUE ALERT  Critical Value:  Potassium 2.7  Date & Time Notied:  10/9 0530  Provider Notified: Opyd  Orders Received/Actions taken: awaiting orders

## 2017-09-04 NOTE — Discharge Summary (Signed)
[facility]  Physician Discharge Summary   Patient ID: Cindy [name] MRN: [id number] DOB/AGE: 1991-05-30 26 y.o.  Admit date: 09/02/2017 Discharge date: 09/04/2017  PCP: Nicola Girt, DO  DISCHARGE DIAGNOSES:  Active Problems:   AKI (acute kidney injury) ([place])   RECOMMENDATIONS FOR OUTPATIENT FOLLOW UP: 1. Follow-up with PCP in one week   DISCHARGE CONDITION: fair  Diet recommendation: As before  Filed Weights   09/02/17 1916 09/02/17 2300  Weight: 47.2 kg (104 lb) 46.7 kg (103 lb)    INITIAL HISTORY: 26 year old female with a past medical history significant for familial adenomatous polyposis, right colon cancer, status post colectomy and terminal ileal resection in 2015 at [facility]. She presented with 18 hours of increased output from her ostomy with the worsening abdominal pain. She had some nausea but no vomiting. She was found to have electrolyte abnormalities and was found to be in renal failure. She was hospitalized for further management.   HOSPITAL COURSE:   Increased output from ostomy/diarrhea. Patient denied any fever, chills. Her abdomen was benign. Patient was started on symptomatic treatment. She was given IV fluids. Symptoms improved. Her diarrhea subsided. She is tolerating her diet.   Acute kidney injury with hyponatremia and hypokalemia/non-anion Metabolic acidosis Patient is given IV fluids. Her electrolytes were repleted. Her potassium is now normal. Renal function has improved significantly. Sodium level is normal. She may continue with her home medications. She will be given a prescription for sodium bicarbonate for a few more days.   Hypotension. Most likely secondary to hypovolemia in the setting of diarrhea. Blood pressure remains borderline low. Previous records reviewed. Her blood pressure typically runs low. This is likely her baseline. She is asymptomatic and ambulating without any difficulties.   History of  anxiety and depression. Continue with home medications. This appears to be stable.  Microcytic anemia. Drop in hemoglobin noted. This was mostly dilutional. Her baseline hemoglobin is around 10. No evidence for overt bleeding. Hemoglobin is stable today.  Overall, stable. Electrolytes repleted. Patient feels better. Wants to go home. Okay for discharge.    PERTINENT LABS:  The results of significant diagnostics from this hospitalization (including imaging, microbiology, ancillary and laboratory) are listed below for reference.    Microbiology: Recent Results (from the past 240 hour(s))  MRSA PCR Screening     Status: None   Collection Time: 09/02/17 11:16 PM  Result Value Ref Range Status   MRSA by PCR NEGATIVE NEGATIVE Final    Comment:        The GeneXpert MRSA Assay (FDA approved for NASAL specimens only), is one component of a comprehensive MRSA colonization surveillance program. It is not intended to diagnose MRSA infection nor to guide or monitor treatment for MRSA infections.      Labs: Basic Metabolic Panel:  Recent Labs Lab 09/02/17 2031 09/03/17 0520 09/04/17 0418 09/04/17 1408  NA 126* 133* 134* 137  K 2.6* 2.7* 2.7* 4.0  CL 96* 109 110 115*  CO2 16* 13* 17* 16*  GLUCOSE 103* 144* 94 113*  BUN 28* 21* 13 9  CREATININE 2.26* 1.80* 1.46* 1.43*  CALCIUM 8.9 7.5* 7.9* 8.3*  MG  --  1.8  --   --    Liver Function Tests:  Recent Labs Lab 09/02/17 2031 09/03/17 0520  AST 18 19  ALT 14 11*  ALKPHOS 90 60  BILITOT 0.4 0.4  PROT 8.5* 5.6*  ALBUMIN 4.6 3.0*    Recent Labs Lab 09/02/17 2031  LIPASE 48   CBC:  Recent Labs Lab 09/02/17 2031 09/03/17 0520 09/04/17 0418  WBC 14.4* 9.3 8.3  NEUTROABS 10.8*  --   --   HGB 13.4 9.9* 10.1*  HCT 38.9 29.1* 30.1*  MCV 76.3* 77.6* 77.6*  PLT 388 240 244    DISCHARGE EXAMINATION: Vitals:   09/03/17 1258 09/03/17 2211 09/04/17 0602 09/04/17 1310  BP: 96/60 (!) 90/54 93/65 109/68  Pulse: 95  82 78 87  Resp: 16 16 16 16   Temp: 98.2 F (36.8 C) 98.2 F (36.8 C) 98.1 F (36.7 C) 98.1 F (36.7 C)  TempSrc: Oral Oral Oral Oral  SpO2: 100% 100% 100% 100%  Weight:      Height:       General appearance: alert, cooperative, appears stated age and no distress Resp: clear to auscultation bilaterally Cardio: regular rate and rhythm, S1, S2 normal, no murmur, click, rub or gallop GI: soft, non-tender; bowel sounds normal; no masses,  no organomegaly. Ostomy noted.  DISPOSITION: Home  Discharge Instructions    Call MD for:  difficulty breathing, headache or visual disturbances    Complete by:  As directed    Call MD for:  extreme fatigue    Complete by:  As directed    Call MD for:  persistant dizziness or light-headedness    Complete by:  As directed    Call MD for:  persistant nausea and vomiting    Complete by:  As directed    Call MD for:  severe uncontrolled pain    Complete by:  As directed    Call MD for:  temperature >100.4    Complete by:  As directed    Discharge instructions    Complete by:  As directed    Please be sure to follow-up with your primary care provider in one week. Take your medications as prescribed. Diet as before.  You were cared for by a hospitalist during your hospital stay. If you have any questions about your discharge medications or the care you received while you were in the hospital after you are discharged, you can call the unit and asked to speak with the hospitalist on call if the hospitalist that took care of you is not available. Once you are discharged, your primary care physician will handle any further medical issues. Please note that NO REFILLS for any discharge medications will be authorized once you are discharged, as it is imperative that you return to your primary care physician (or establish a relationship with a primary care physician if you do not have one) for your aftercare needs so that they can reassess your need for medications  and monitor your lab values. If you do not have a primary care physician, you can call [phone] for a physician referral.   Increase activity slowly    Complete by:  As directed       ALLERGIES:  Allergies  Allergen Reactions  . Venofer [Ferric Oxide] Anaphylaxis  . Soap Hives, Itching and Other (See Comments)     allergic to [employer].    . Food Hives and Other (See Comments)     allergic to orange juice.   . Morphine And Related Swelling    When given IV, swelling around site  . Sulfa Antibiotics Other (See Comments)    dehydration  . Sulfamethoxazole-Trimethoprim Nausea And Vomiting    Patient developed AKI that appears to have been from bactrim. She might be able to take again if watched  closely and it is absolutely needed - ie there was no anaphylaxis or allergic rash  . Iron Palpitations  . Levaquin [Levofloxacin] Hives     Current Discharge Medication List    START taking these medications   Details  sodium bicarbonate 650 MG tablet Take 1 tablet (650 mg total) by mouth 2 (two) times daily. Qty: 20 tablet, Refills: 0      CONTINUE these medications which have CHANGED   Details  ondansetron (ZOFRAN) 4 MG tablet Take 1 tablet (4 mg total) by mouth every 8 (eight) hours as needed for nausea or vomiting. Qty: 30 tablet, Refills: 0    potassium chloride 20 MEQ/15ML (10%) SOLN Take 20 mLs (26.6667 mEq total) by mouth 3 (three) times daily. Qty: 900 mL, Refills: 0      CONTINUE these medications which have NOT CHANGED   Details  citalopram (CELEXA) 10 MG tablet Take 30 mg by mouth daily.    cyanocobalamin (,VITAMIN B-12,) 1000 MCG/ML injection Inject 1,000 mcg into the muscle every 30 (thirty) days.    cyclobenzaprine (FLEXERIL) 5 MG tablet Take 5 mg by mouth every 8 (eight) hours as needed for migraine.    diphenoxylate-atropine (LOMOTIL) 2.5-0.025 MG/5ML liquid Take 25 mLs by mouth 2 (two) times daily as needed for diarrhea or loose stools.    loperamide  (IMODIUM) 1 MG/5ML solution Take 3 mg by mouth 3 (three) times daily as needed for diarrhea or loose stools.    LORazepam (ATIVAN) 1 MG tablet Take 1 mg by mouth 3 (three) times daily as needed for anxiety.    magnesium oxide (MAG-OX) 400 MG tablet Take 400 mg by mouth at bedtime.    Multiple Vitamins-Minerals (MULTIVITAMIN GUMMIES ADULT) CHEW Chew 2 each by mouth daily.     promethazine (PHENERGAN) 12.5 MG tablet Take 12.5 mg by mouth every 6 (six) hours as needed for nausea or vomiting.    Vitamin D, Ergocalciferol, (DRISDOL) 50000 units CAPS capsule Take 50,000 Units by mouth every Monday.         Follow-up Information    Nicola Girt, DO. Schedule an appointment as soon as possible for a visit in 1 week(s).   Specialty:  Internal Medicine Contact information: [address] [place] [address] [phone]           TOTAL DISCHARGE TIME: 66 mins  [facility] Pager [phone]  09/04/2017, 3:39 PM

## 2018-01-03 ENCOUNTER — Encounter (HOSPITAL_COMMUNITY): Payer: Self-pay | Admitting: Emergency Medicine

## 2018-01-03 ENCOUNTER — Other Ambulatory Visit: Payer: Self-pay

## 2018-01-03 DIAGNOSIS — E876 Hypokalemia: Secondary | ICD-10-CM | POA: Diagnosis present

## 2018-01-03 DIAGNOSIS — Z5321 Procedure and treatment not carried out due to patient leaving prior to being seen by health care provider: Secondary | ICD-10-CM | POA: Diagnosis not present

## 2018-01-03 NOTE — ED Triage Notes (Signed)
Pt states her doctor called her and told her to come to the hospital to receive IV potassium as her level at the dr office was 2.5

## 2018-01-04 ENCOUNTER — Emergency Department (HOSPITAL_COMMUNITY)
Admission: EM | Admit: 2018-01-04 | Discharge: 2018-01-04 | Disposition: A | Discharge status: Home / Self Care | Payer: PRIVATE HEALTH INSURANCE | Attending: Emergency Medicine | Admitting: Emergency Medicine

## 2018-01-04 NOTE — ED Notes (Signed)
First attempt---PT did not respond to name.

## 2018-01-04 NOTE — ED Notes (Signed)
Called  No response from lobby 

## 2018-07-12 ENCOUNTER — Emergency Department (HOSPITAL_COMMUNITY): Payer: Self-pay

## 2018-07-12 ENCOUNTER — Encounter (HOSPITAL_COMMUNITY): Payer: Self-pay | Admitting: Emergency Medicine

## 2018-07-12 ENCOUNTER — Observation Stay (HOSPITAL_COMMUNITY)
Admission: EM | Admit: 2018-07-12 | Discharge: 2018-07-14 | Disposition: A | Discharge status: Home / Self Care | Payer: Self-pay | Attending: Internal Medicine | Admitting: Internal Medicine

## 2018-07-12 ENCOUNTER — Other Ambulatory Visit: Payer: Self-pay

## 2018-07-12 DIAGNOSIS — Z881 Allergy status to other antibiotic agents status: Secondary | ICD-10-CM | POA: Insufficient documentation

## 2018-07-12 DIAGNOSIS — N179 Acute kidney failure, unspecified: Secondary | ICD-10-CM | POA: Insufficient documentation

## 2018-07-12 DIAGNOSIS — Z85038 Personal history of other malignant neoplasm of large intestine: Secondary | ICD-10-CM | POA: Insufficient documentation

## 2018-07-12 DIAGNOSIS — Z9049 Acquired absence of other specified parts of digestive tract: Secondary | ICD-10-CM | POA: Insufficient documentation

## 2018-07-12 DIAGNOSIS — D126 Benign neoplasm of colon, unspecified: Secondary | ICD-10-CM | POA: Diagnosis present

## 2018-07-12 DIAGNOSIS — Z932 Ileostomy status: Secondary | ICD-10-CM | POA: Insufficient documentation

## 2018-07-12 DIAGNOSIS — R748 Abnormal levels of other serum enzymes: Secondary | ICD-10-CM | POA: Diagnosis present

## 2018-07-12 DIAGNOSIS — Z87892 Personal history of anaphylaxis: Secondary | ICD-10-CM | POA: Insufficient documentation

## 2018-07-12 DIAGNOSIS — E861 Hypovolemia: Secondary | ICD-10-CM | POA: Diagnosis present

## 2018-07-12 DIAGNOSIS — Z885 Allergy status to narcotic agent status: Secondary | ICD-10-CM | POA: Insufficient documentation

## 2018-07-12 DIAGNOSIS — N183 Chronic kidney disease, stage 3 (moderate): Secondary | ICD-10-CM | POA: Insufficient documentation

## 2018-07-12 DIAGNOSIS — E872 Acidosis: Secondary | ICD-10-CM | POA: Insufficient documentation

## 2018-07-12 DIAGNOSIS — I959 Hypotension, unspecified: Secondary | ICD-10-CM | POA: Insufficient documentation

## 2018-07-12 DIAGNOSIS — D509 Iron deficiency anemia, unspecified: Secondary | ICD-10-CM | POA: Diagnosis present

## 2018-07-12 DIAGNOSIS — E860 Dehydration: Secondary | ICD-10-CM | POA: Insufficient documentation

## 2018-07-12 DIAGNOSIS — E876 Hypokalemia: Principal | ICD-10-CM | POA: Diagnosis present

## 2018-07-12 DIAGNOSIS — R1030 Lower abdominal pain, unspecified: Secondary | ICD-10-CM | POA: Diagnosis present

## 2018-07-12 DIAGNOSIS — Z882 Allergy status to sulfonamides status: Secondary | ICD-10-CM | POA: Insufficient documentation

## 2018-07-12 DIAGNOSIS — Z79899 Other long term (current) drug therapy: Secondary | ICD-10-CM | POA: Insufficient documentation

## 2018-07-12 DIAGNOSIS — F329 Major depressive disorder, single episode, unspecified: Secondary | ICD-10-CM | POA: Insufficient documentation

## 2018-07-12 DIAGNOSIS — Z87442 Personal history of urinary calculi: Secondary | ICD-10-CM | POA: Insufficient documentation

## 2018-07-12 DIAGNOSIS — D1391 Familial adenomatous polyposis: Secondary | ICD-10-CM | POA: Diagnosis present

## 2018-07-12 DIAGNOSIS — F419 Anxiety disorder, unspecified: Secondary | ICD-10-CM | POA: Insufficient documentation

## 2018-07-12 DIAGNOSIS — R198 Other specified symptoms and signs involving the digestive system and abdomen: Secondary | ICD-10-CM

## 2018-07-12 LAB — I-STAT BETA HCG BLOOD, ED (MC, WL, AP ONLY): I-stat hCG, quantitative: <5 m[IU]/mL (ref <5)

## 2018-07-12 LAB — COMPREHENSIVE METABOLIC PANEL
ALT: 13 U/L (ref 0–44)
AST: 18 U/L (ref 15–41)
Albumin: 3.9 g/dL (ref 3.5–5.0)
Alkaline Phosphatase: 63 U/L (ref 38–126)
Anion gap: 9 (ref 5–15)
BUN: 28 mg/dL — ABNORMAL HIGH (ref 6–20)
CHLORIDE: 104 mmol/L (ref 98–111)
CO2: 19 mmol/L — ABNORMAL LOW (ref 22–32)
CREATININE: 1.75 mg/dL — AB (ref 0.44–1.00)
Calcium: 8.7 mg/dL — ABNORMAL LOW (ref 8.9–10.3)
GFR, EST AFRICAN AMERICAN: 44 mL/min — AB (ref >60)
GFR, EST NON AFRICAN AMERICAN: 38 mL/min — AB (ref >60)
Glucose, Bld: 105 mg/dL — ABNORMAL HIGH (ref 70–99)
POTASSIUM: 2.6 mmol/L — AB (ref 3.5–5.1)
Sodium: 132 mmol/L — ABNORMAL LOW (ref 135–145)
Total Bilirubin: 0.5 mg/dL (ref 0.3–1.2)
Total Protein: 7.1 g/dL (ref 6.5–8.1)

## 2018-07-12 LAB — URINALYSIS, ROUTINE W REFLEX MICROSCOPIC
Bilirubin Urine: NEGATIVE
GLUCOSE, UA: NEGATIVE mg/dL
Ketones, ur: NEGATIVE mg/dL
Leukocytes, UA: NEGATIVE
Nitrite: NEGATIVE
PROTEIN: NEGATIVE mg/dL
Specific Gravity, Urine: 1.006 (ref 1.005–1.030)
pH: 6 (ref 5.0–8.0)

## 2018-07-12 LAB — CBC
HCT: 34.1 % — ABNORMAL LOW (ref 36.0–46.0)
Hemoglobin: 11.2 g/dL — ABNORMAL LOW (ref 12.0–15.0)
MCH: 25.4 pg — ABNORMAL LOW (ref 26.0–34.0)
MCHC: 32.8 g/dL (ref 30.0–36.0)
MCV: 77.3 fL — AB (ref 78.0–100.0)
PLATELETS: 323 10*3/uL (ref 150–400)
RBC: 4.41 MIL/uL (ref 3.87–5.11)
RDW: 20.5 % — ABNORMAL HIGH (ref 11.5–15.5)
WBC: 8.8 10*3/uL (ref 4.0–10.5)

## 2018-07-12 LAB — LIPASE, BLOOD: LIPASE: 66 U/L — AB (ref 11–51)

## 2018-07-12 MED ORDER — FENTANYL CITRATE (PF) 100 MCG/2ML IJ SOLN
50.0000 ug | Freq: Once | INTRAMUSCULAR | Status: AC
  Administered 2018-07-12: 50 ug via INTRAVENOUS
  Filled 2018-07-12: qty 2

## 2018-07-12 MED ORDER — POTASSIUM CHLORIDE 10 MEQ/100ML IV SOLN
10.0000 meq | INTRAVENOUS | Status: AC
  Administered 2018-07-12 – 2018-07-13 (×3): 10 meq via INTRAVENOUS
  Filled 2018-07-12 (×3): qty 100

## 2018-07-12 MED ORDER — SODIUM CHLORIDE 0.9 % IV BOLUS
1000.0000 mL | Freq: Once | INTRAVENOUS | Status: AC
  Administered 2018-07-12: 1000 mL via INTRAVENOUS

## 2018-07-12 MED ORDER — IOPAMIDOL (ISOVUE-300) INJECTION 61%
100.0000 mL | Freq: Once | INTRAVENOUS | Status: AC | PRN
  Administered 2018-07-12: 100 mL via INTRAVENOUS

## 2018-07-12 MED ORDER — IOPAMIDOL (ISOVUE-300) INJECTION 61%
INTRAVENOUS | Status: AC
  Administered 2018-07-12: 100 mL via INTRAVENOUS
  Filled 2018-07-12: qty 100

## 2018-07-12 NOTE — ED Notes (Signed)
Pt states she wants blood draws taken from port.

## 2018-07-12 NOTE — ED Provider Notes (Addendum)
[facility] Emergency Department Provider Note MRN:  [id number]  Arrival date & time: 07/13/18     Chief Complaint   Abdominal Pain   History of Present Illness   Cindy [name] is a 27 y.o. year-old female with a history of colon cancer status post resection and ileostomy presenting to the ED with chief complaint of abdominal pain.  2 to 3 days of progressively worsening lower abdominal pain, left greater than right.  Decreased p.o. tolerance, getting full much faster than normal.  Denies nausea or vomiting, no headache or vision change, no chest pain or shortness of breath.  No upper abdominal pain, no fever.  No dysuria.  Labs at PCP today revealed hypokalemia.  Complaining of lower leg cramping intermittently.  Review of Systems  A complete 10 system review of systems was obtained and all systems are negative except as noted in the HPI and PMH.   Patient's Health History    Past Medical History:  Diagnosis Date  . Bilateral flank pain    due to ureteral stents  . Chronic hypokalemia   . CKD (chronic kidney disease), stage II   . Colon cancer ([place]) DX  2014---  ONCOLOGIST AT [facility]   DX RIGHT ACSECDING CARCINOMA IN BACKGROUND FAP AND SEVERE MALNUTRITION-- Stage 2A  (pT3, N0, M0)  s/p subtotal colecotmy and completed protectomy and ileostomy w/ revision  . Complication of anesthesia    post op aspiration pneumonia w/ lap. appy 12-23-2007 (emergent ruptured appendix)  . FAP (familial adenomatous polyposis) 09/30/2014  . Heart murmur   . History of acute renal failure    2015;  2016  . History of fetal demise, not currently pregnant    03-22-2013  stillborn at 53 wks  . History of kidney stones   . History of sepsis    admitted 12-03-2016 (post-op day 2 w/ bilateral ureteral stents and stone manipluation) discharged 12-06-2016  for urosepsis  . Hypomagnesemia   . Ileostomy in place [facility])   . Iron deficiency anemia   . Nephrolithiasis    BILATERAL   .  Normocytic anemia   . Renal cyst, left    PER CT 11-12-2016  . Urgency of urination     Past Surgical History:  Procedure Laterality Date  . COLONOSCOPY N/A 05/24/2013   Procedure: COLONOSCOPY;  Surgeon: Missy Sabins, MD;  Location: [place];  Service: Endoscopy;  Laterality: N/A;  . COMPLETION PROTECTOMY AND REVISION ILEOSTOMY/ LYSIS ADHESIONS  07-28-2014    [facility]  . CYSTOSCOPY W/ RETROGRADES Right 12/15/2016   Procedure: CYSTOSCOPY WITH RETROGRADE PYELOGRAM;  Surgeon: Alexis Frock, MD;  Location: [facility];  Service: Urology;  Laterality: Right;  . CYSTOSCOPY W/ URETERAL STENT PLACEMENT Bilateral 11/01/2016   Procedure: CYSTOSCOPY WITH BILATERAL RETROGRADE PYELOGRAM BILATERAL URETERAL STENT PLACEMENT;  Surgeon: Alexis Frock, MD;  Location: [facility];  Service: Urology;  Laterality: Bilateral;  . CYSTOSCOPY W/ URETERAL STENT PLACEMENT Right 12/15/2016   Procedure: CYSTOSCOPY WITH STENT REPLACEMENT;  Surgeon: Alexis Frock, MD;  Location: [facility];  Service: Urology;  Laterality: Right;  . CYSTOSCOPY W/ URETERAL STENT REMOVAL Bilateral 12/15/2016   Procedure: CYSTOSCOPY WITH STENT REMOVAL;  Surgeon: Alexis Frock, MD;  Location: [facility];  Service: Urology;  Laterality: Bilateral;  . CYSTOSCOPY WITH URETEROSCOPY Bilateral 12/01/2016   Procedure: FIRST STAGE CYSTOSCOPY WITH URETEROSCOPY,  STONE MANIPULATION and BASKETRY, STENT EXCHANGE;  Surgeon: Alexis Frock, MD;  Location[facility];  Service: Urology;  Laterality: Bilateral;  . CYSTOSCOPY WITH URETEROSCOPY Right 12/15/2016   Procedure: SECOND STAGE -CYSTOSCOPY WITH URETEROSCOPY AND STONE EXTRACTION WITH BASKET;  Surgeon: Alexis Frock, MD;  Location: [facility];  Service: Urology;  Laterality: Right;  . HOLMIUM LASER APPLICATION Bilateral 03/30/2705   Procedure: HOLMIUM LASER APPLICATION;  Surgeon: Alexis Frock, MD;  Location[facility];   Service: Urology;  Laterality: Bilateral;  . HOLMIUM LASER APPLICATION Right 2/37/6283   Procedure: HOLMIUM LASER APPLICATION;  Surgeon: Alexis Frock, MD;  Location: [facility];  Service: Urology;  Laterality: Right;  . I&D EXTREMITY Left 11/14/2013   Procedure: IRRIGATION AND DEBRIDEMENT LEFT LONG FINGER;  Surgeon: Tennis Must, MD;  Location: [place];  Service: Orthopedics;  Laterality: Left;  . IM NAILING TIBIA Left 08/23/2010  . LAPAROSCOPIC APPENDECTOMY  12/23/2007  . LEG RECONSTRUCTION USING FASCIAL FLAP  ~ 2009  . SUBTOTAL COLECTOMY /  RESECTION ENBLOC DISTAL ILEUM/  ILEOSTOMY  07/ 2014  [facility]  . TRANSTHORACIC ECHOCARDIOGRAM  11/11/2011   ef 55-60%/  mild MR/  trivial PR and TR/  PASP 29-53mmHg/  trivial pericardial effusion identified    Family History  Adopted: Yes  Problem Relation Age of Onset  . ALS Mother   . Alcohol abuse Neg Hx   . Arthritis Neg Hx   . Asthma Neg Hx   . Birth defects Neg Hx   . Cancer Neg Hx   . COPD Neg Hx   . Depression Neg Hx   . Diabetes Neg Hx   . Drug abuse Neg Hx   . Early death Neg Hx   . Hearing loss Neg Hx   . Heart disease Neg Hx   . Hyperlipidemia Neg Hx   . Hypertension Neg Hx   . Kidney disease Neg Hx   . Learning disabilities Neg Hx   . Mental illness Neg Hx   . Mental retardation Neg Hx   . Miscarriages / Stillbirths Neg Hx   . Stroke Neg Hx   . Vision loss Neg Hx     Social History   Socioeconomic History  . Marital status: Single    Spouse name: Not on file  . Number of children: Not on file  . Years of education: Not on file  . Highest education level: Not on file  Occupational History  . Occupation: Unemployed  Social Needs  . Financial resource strain: Not on file  . Food insecurity:    Worry: Not on file    Inability: Not on file  . Transportation needs:    Medical: Not on file    Non-medical: Not on file  Tobacco Use  . Smoking status: Never Smoker  . Smokeless tobacco:  Never Used  Substance and Sexual Activity  . Alcohol use: No  . Drug use: No  . Sexual activity: Not on file  Lifestyle  . Physical activity:    Days per week: Not on file    Minutes per session: Not on file  . Stress: Not on file  Relationships  . Social connections:    Talks on phone: Not on file    Gets together: Not on file    Attends religious service: Not on file    Active member of club or organization: Not on file    Attends meetings of clubs or organizations: Not on file    Relationship status: Not on file  . Intimate partner violence:    Fear of current  or ex partner: Not on file    Emotionally abused: Not on file    Physically abused: Not on file    Forced sexual activity: Not on file  Other Topics Concern  . Not on file  Social History Narrative   ** Merged History Encounter **       Lives with her father.  Unemployed.  Previously worked at [employer].     Physical Exam  Vital Signs and Nursing Notes reviewed Vitals:   07/12/18 2037 07/12/18 2343  BP:  (!) 151/97  Pulse: 80 69  Resp: 17 18  Temp:    SpO2: 100% 100%    CONSTITUTIONAL:  well-appearing, NAD NEURO:  Alert and oriented x 3, no focal deficits EYES:  eyes equal and reactive ENT/NECK:  no LAD, no JVD CARDIO:  regular rate, well-perfused, normal S1 and S2 PULM:  CTAB no wheezing or rhonchi GI/GU:  normal bowel sounds, non-distended; ileostomy bag in place right lower quadrant, moderate tenderness to palpation to lower abdomen. MSK/SPINE:  No gross deformities, no edema SKIN:  no rash, atraumatic PSYCH:  Appropriate speech and behavior  Diagnostic and Interventional Summary    EKG Interpretation  Date/Time:    Ventricular Rate:    PR Interval:    QRS Duration:   QT Interval:    QTC Calculation:   R Axis:     Text Interpretation:        Labs Reviewed  LIPASE, BLOOD - Abnormal; Notable for the following components:      Result Value   Lipase 66 (*)    All other components within  normal limits  COMPREHENSIVE METABOLIC PANEL - Abnormal; Notable for the following components:   Sodium 132 (*)    Potassium 2.6 (*)    CO2 19 (*)    Glucose, Bld 105 (*)    BUN 28 (*)    Creatinine, Ser 1.75 (*)    Calcium 8.7 (*)    GFR calc non Af Amer 38 (*)    GFR calc Af Amer 44 (*)    All other components within normal limits  CBC - Abnormal; Notable for the following components:   Hemoglobin 11.2 (*)    HCT 34.1 (*)    MCV 77.3 (*)    MCH 25.4 (*)    RDW 20.5 (*)    All other components within normal limits  URINALYSIS, ROUTINE W REFLEX MICROSCOPIC - Abnormal; Notable for the following components:   Hgb urine dipstick SMALL (*)    Bacteria, UA RARE (*)    All other components within normal limits  I-STAT BETA HCG BLOOD, ED ([facility], [facility], [facility] ONLY)    CT Abdomen Pelvis W Contrast  Final Result      Medications  potassium chloride 10 mEq in 100 mL IVPB (0 mEq Intravenous Stopped 07/12/18 2354)  sodium chloride 0.9 % bolus 1,000 mL (has no administration in time range)  potassium chloride (KLOR-CON) packet 40 mEq (has no administration in time range)  sodium chloride 0.9 % bolus 1,000 mL (1,000 mLs Intravenous New Bag/Given 07/12/18 2142)  fentaNYL (SUBLIMAZE) injection 50 mcg (50 mcg Intravenous Given 07/12/18 2143)  iopamidol (ISOVUE-300) 61 % injection 100 mL (100 mLs Intravenous Contrast Given 07/12/18 2330)     Procedures Critical Care Critical Care Documentation Critical care time provided by me (excluding procedures): 35 minutes  Condition necessitating critical care: dehydration, profound hypokalemia  Components of critical care management: reviewing of prior records, laboratory and imaging interpretation, frequent re-examination and  reassessment of vital signs, administration of IV fluid resuscitation, IV potassium administration, discussion with consulting services.    ED Course and Medical Decision Making  I have reviewed the triage vital signs and the nursing  notes.  Pertinent labs & imaging results that were available during my care of the patient were reviewed by me and considered in my medical decision making (see below for details). Clinical Course as of Jul 13 20  Fri Jul 12, 2018  1952 Moderate tenderness surrounding the ostomy site.  Question of anastomotic complication, abscess, leak, diverticulitis, appendicitis.  CT pending, labs pending.  Will provide fluid resuscitation given heart rate 725, BP 95 systolic.   [MB]    Clinical Course User Index [MB] Maudie Flakes, MD    Labs reveal significant hypokalemia to 2.6, AKI.  Patient continues to feel unwell, doubt her ability to replete her potassium and fluids at home.  CT with no acute process.  Will admit to hospital service.  Barth Kirks. Sedonia Small, MD [place] [email or web address]  Final Clinical Impressions(s) / ED Diagnoses     ICD-10-CM   1. Lower abdominal pain R10.30   2. Hypokalemia E87.6   3. AKI (acute kidney injury) [facility]) N17.9     ED Discharge Orders    None         Maudie Flakes, MD 07/13/18 Daralene Milch, MD 07/29/18 1128

## 2018-07-12 NOTE — ED Notes (Signed)
CRITICAL VALUE STICKER  CRITICAL VALUE: K 2.6  RECEIVER (on-site recipient of call): Merrilyn Puma  DATE & TIME NOTIFIED: 07/12/18 1125  MESSENGER (representative from lab): Ethelene Browns RN  MD NOTIFIED:   TIME OF NOTIFICATION:  RESPONSE:

## 2018-07-12 NOTE — ED Triage Notes (Signed)
Patient has hx of colon cancer. Patient is complaining of abdominal pain. Patient states the pain started this morning. Patient does not have any other symptoms.

## 2018-07-12 NOTE — ED Notes (Signed)
Pt port-a-cath accessed. Blood returned noted. Flushes well. Antimicrobial disc in place with a transparent, occlusive dressing.

## 2018-07-13 ENCOUNTER — Encounter (HOSPITAL_COMMUNITY): Payer: Self-pay

## 2018-07-13 DIAGNOSIS — D126 Benign neoplasm of colon, unspecified: Secondary | ICD-10-CM

## 2018-07-13 DIAGNOSIS — Z932 Ileostomy status: Secondary | ICD-10-CM

## 2018-07-13 DIAGNOSIS — R1030 Lower abdominal pain, unspecified: Secondary | ICD-10-CM | POA: Diagnosis present

## 2018-07-13 DIAGNOSIS — E876 Hypokalemia: Secondary | ICD-10-CM

## 2018-07-13 DIAGNOSIS — R198 Other specified symptoms and signs involving the digestive system and abdomen: Secondary | ICD-10-CM

## 2018-07-13 DIAGNOSIS — I9589 Other hypotension: Secondary | ICD-10-CM

## 2018-07-13 DIAGNOSIS — R748 Abnormal levels of other serum enzymes: Secondary | ICD-10-CM | POA: Diagnosis present

## 2018-07-13 DIAGNOSIS — D509 Iron deficiency anemia, unspecified: Secondary | ICD-10-CM

## 2018-07-13 DIAGNOSIS — N179 Acute kidney failure, unspecified: Secondary | ICD-10-CM

## 2018-07-13 LAB — BASIC METABOLIC PANEL
Anion gap: 4 — ABNORMAL LOW (ref 5–15)
Anion gap: 3 — ABNORMAL LOW (ref 5–15)
BUN: 21 mg/dL — AB (ref 6–20)
BUN: 13 mg/dL (ref 6–20)
CALCIUM: 7.6 mg/dL — AB (ref 8.9–10.3)
CO2: 14 mmol/L — AB (ref 22–32)
CO2: 16 mmol/L — ABNORMAL LOW (ref 22–32)
CREATININE: 1.25 mg/dL — AB (ref 0.44–1.00)
Calcium: 7.4 mg/dL — ABNORMAL LOW (ref 8.9–10.3)
Chloride: 112 mmol/L — ABNORMAL HIGH (ref 98–111)
Chloride: 118 mmol/L — ABNORMAL HIGH (ref 98–111)
Creatinine, Ser: 1.29 mg/dL — ABNORMAL HIGH (ref 0.44–1.00)
GFR calc Af Amer: >60 mL/min (ref >60)
GFR calc non Af Amer: 58 mL/min — ABNORMAL LOW (ref >60)
GFR, EST NON AFRICAN AMERICAN: 55 mL/min — AB (ref >60)
Glucose, Bld: 103 mg/dL — ABNORMAL HIGH (ref 70–99)
Glucose, Bld: 141 mg/dL — ABNORMAL HIGH (ref 70–99)
POTASSIUM: 3.1 mmol/L — AB (ref 3.5–5.1)
Potassium: 2.9 mmol/L — ABNORMAL LOW (ref 3.5–5.1)
SODIUM: 130 mmol/L — AB (ref 135–145)
SODIUM: 137 mmol/L (ref 135–145)

## 2018-07-13 LAB — CBC
HCT: 29.1 % — ABNORMAL LOW (ref 36.0–46.0)
Hemoglobin: 9.7 g/dL — ABNORMAL LOW (ref 12.0–15.0)
MCH: 26.1 pg (ref 26.0–34.0)
MCHC: 33.3 g/dL (ref 30.0–36.0)
MCV: 78.4 fL (ref 78.0–100.0)
PLATELETS: 231 10*3/uL (ref 150–400)
RBC: 3.71 MIL/uL — AB (ref 3.87–5.11)
RDW: 20.8 % — AB (ref 11.5–15.5)
WBC: 6.8 10*3/uL (ref 4.0–10.5)

## 2018-07-13 LAB — MAGNESIUM: Magnesium: 1.7 mg/dL (ref 1.7–2.4)

## 2018-07-13 MED ORDER — ACETAMINOPHEN 325 MG PO TABS: 650.0000 mg | ORAL_TABLET | Freq: Four times a day (QID) | ORAL | Status: DC | PRN

## 2018-07-13 MED ORDER — POTASSIUM CHLORIDE 10 MEQ/100ML IV SOLN
10.0000 meq | INTRAVENOUS | Status: AC
  Administered 2018-07-13 (×4): 10 meq via INTRAVENOUS
  Filled 2018-07-13 (×4): qty 100

## 2018-07-13 MED ORDER — LORAZEPAM 1 MG PO TABS
1.0000 mg | ORAL_TABLET | Freq: Three times a day (TID) | ORAL | Status: DC | PRN
  Administered 2018-07-13: 1 mg via ORAL
  Filled 2018-07-13: qty 1

## 2018-07-13 MED ORDER — RISAQUAD PO CAPS
1.0000 | ORAL_CAPSULE | Freq: Every day | ORAL | Status: DC
  Administered 2018-07-13 – 2018-07-14 (×2): 1 via ORAL
  Filled 2018-07-13 (×2): qty 1

## 2018-07-13 MED ORDER — SODIUM CHLORIDE 0.9 % IV SOLN
INTRAVENOUS | Status: DC
  Administered 2018-07-13: 03:00:00 via INTRAVENOUS

## 2018-07-13 MED ORDER — CITALOPRAM HYDROBROMIDE 20 MG PO TABS
30.0000 mg | ORAL_TABLET | Freq: Every day | ORAL | Status: DC
  Administered 2018-07-13 – 2018-07-14 (×2): 30 mg via ORAL
  Filled 2018-07-13 (×2): qty 2

## 2018-07-13 MED ORDER — POTASSIUM CHLORIDE CRYS ER 20 MEQ PO TBCR: 40.0000 meq | EXTENDED_RELEASE_TABLET | ORAL | Status: DC

## 2018-07-13 MED ORDER — SODIUM CHLORIDE 0.9 % IV SOLN
1.0000 g | Freq: Once | INTRAVENOUS | Status: AC
  Administered 2018-07-13: 1 g via INTRAVENOUS
  Filled 2018-07-13: qty 10

## 2018-07-13 MED ORDER — ONDANSETRON HCL 4 MG/2ML IJ SOLN
4.0000 mg | Freq: Four times a day (QID) | INTRAMUSCULAR | Status: DC | PRN
  Administered 2018-07-13 – 2018-07-14 (×2): 4 mg via INTRAVENOUS
  Filled 2018-07-13 (×2): qty 2

## 2018-07-13 MED ORDER — HYDROCODONE-ACETAMINOPHEN 5-325 MG PO TABS
1.0000 | ORAL_TABLET | Freq: Four times a day (QID) | ORAL | Status: DC | PRN
  Administered 2018-07-13 – 2018-07-14 (×2): 1 via ORAL
  Filled 2018-07-13 (×3): qty 1

## 2018-07-13 MED ORDER — SODIUM CHLORIDE 0.9 % IV BOLUS
1000.0000 mL | Freq: Once | INTRAVENOUS | Status: AC
  Administered 2018-07-13: 1000 mL via INTRAVENOUS

## 2018-07-13 MED ORDER — DEXTROSE-NACL 5-0.9 % IV SOLN
INTRAVENOUS | Status: DC
  Administered 2018-07-13: 100 mL/h via INTRAVENOUS
  Administered 2018-07-13: 12:00:00 via INTRAVENOUS
  Administered 2018-07-14: 100 mL/h via INTRAVENOUS

## 2018-07-13 MED ORDER — POTASSIUM CHLORIDE 20 MEQ/15ML (10%) PO SOLN
26.6667 meq | Freq: Three times a day (TID) | ORAL | Status: DC
  Administered 2018-07-13: 26.6667 meq via ORAL
  Filled 2018-07-13: qty 30

## 2018-07-13 MED ORDER — ACETAMINOPHEN 650 MG RE SUPP: 650.0000 mg | Freq: Four times a day (QID) | RECTAL | Status: DC | PRN

## 2018-07-13 MED ORDER — MAGNESIUM SULFATE 2 GM/50ML IV SOLN
2.0000 g | Freq: Once | INTRAVENOUS | Status: AC
  Administered 2018-07-13: 2 g via INTRAVENOUS
  Filled 2018-07-13: qty 50

## 2018-07-13 MED ORDER — SODIUM CHLORIDE 0.9% FLUSH
3.0000 mL | Freq: Two times a day (BID) | INTRAVENOUS | Status: DC
  Administered 2018-07-13 (×2): 3 mL via INTRAVENOUS

## 2018-07-13 MED ORDER — MAGNESIUM OXIDE 400 (241.3 MG) MG PO TABS
400.0000 mg | ORAL_TABLET | Freq: Every day | ORAL | Status: DC
  Administered 2018-07-13: 400 mg via ORAL
  Filled 2018-07-13: qty 1

## 2018-07-13 MED ORDER — CENTRUM PO CHEW
2.0000 | CHEWABLE_TABLET | Freq: Every day | ORAL | Status: DC
  Administered 2018-07-14: 2 via ORAL
  Filled 2018-07-13 (×2): qty 2

## 2018-07-13 MED ORDER — POTASSIUM CHLORIDE 20 MEQ PO PACK
40.0000 meq | PACK | Freq: Once | ORAL | Status: AC
  Administered 2018-07-13: 40 meq via ORAL
  Filled 2018-07-13: qty 2

## 2018-07-13 MED ORDER — LORATADINE 10 MG PO TABS: 10.0000 mg | ORAL_TABLET | Freq: Every day | ORAL | Status: DC | PRN

## 2018-07-13 MED ORDER — POTASSIUM CHLORIDE 10 MEQ/100ML IV SOLN: 10.0000 meq | INTRAVENOUS | Status: DC

## 2018-07-13 MED ORDER — ONDANSETRON HCL 4 MG PO TABS: 4.0000 mg | ORAL_TABLET | Freq: Four times a day (QID) | ORAL | Status: DC | PRN

## 2018-07-13 MED ORDER — ENOXAPARIN SODIUM 40 MG/0.4ML ~~LOC~~ SOLN
40.0000 mg | SUBCUTANEOUS | Status: DC
  Filled 2018-07-13 (×2): qty 0.4

## 2018-07-13 MED ORDER — SODIUM CHLORIDE 0.9% FLUSH: 10.0000 mL | Freq: Two times a day (BID) | INTRAVENOUS | Status: DC

## 2018-07-13 MED ORDER — SODIUM CHLORIDE 0.9% FLUSH
10.0000 mL | INTRAVENOUS | Status: DC | PRN
  Administered 2018-07-14: 10 mL
  Filled 2018-07-13: qty 40

## 2018-07-13 MED ORDER — LOPERAMIDE HCL 1 MG/7.5ML PO SUSP
3.0000 mg | Freq: Three times a day (TID) | ORAL | Status: DC | PRN
  Filled 2018-07-13: qty 22.5

## 2018-07-13 MED ORDER — LOPERAMIDE HCL 1 MG/7.5ML PO SUSP
3.0000 mg | Freq: Three times a day (TID) | ORAL | Status: DC
  Administered 2018-07-13: 3 mg via ORAL
  Filled 2018-07-13 (×7): qty 22.5

## 2018-07-13 NOTE — Plan of Care (Signed)
Plan of care discussed.   

## 2018-07-13 NOTE — ED Notes (Signed)
ED TO INPATIENT HANDOFF REPORT  Name/Age/Gender Cindy [name] 27 y.o. female  Code Status    Code Status Orders  (From admission, onward)         Start     Ordered   07/13/18 0114  Full code  Continuous     07/13/18 0119        Code Status History    Date Active Date Inactive Code Status Order ID Comments User Context   09/02/2017 2245 09/04/2017 1923 Full Code [id number]  Tawni Millers, MD Inpatient   06/07/2017 1203 06/08/2017 1606 Full Code [id number]  Theodis Blaze, MD Inpatient   12/04/2016 2005 12/06/2016 1846 Full Code [id number]  Edwin Dada, MD Inpatient   11/01/2016 0906 11/03/2016 1851 Full Code [id number]  Mendel Corning, MD Inpatient   01/14/2016 0136 01/15/2016 2144 Full Code [id number]  Norval Morton, MD Inpatient   07/29/2015 0148 07/31/2015 1315 Full Code [id number]  Toy Baker, MD Inpatient   04/30/2015 0939 05/03/2015 2329 Full Code [id number]  Rama, Venetia Maxon, MD ED   02/16/2015 1626 02/17/2015 2138 Full Code [id number]  Nita Sells, MD Inpatient   01/14/2015 1410 01/16/2015 1843 Full Code [id number]  Theodis Blaze, MD Inpatient   12/06/2014 0652 12/09/2014 1915 Full Code [id number]  Jani Gravel, MD Inpatient   09/30/2014 1329 10/02/2014 1839 Full Code [id number]  Eugenie Filler, MD Inpatient   09/08/2014 0226 09/09/2014 1820 Full Code [id number]  Waldemar Dickens, MD ED   07/11/2014 0209 07/12/2014 1755 Full Code [id number]  Cherene Altes, MD Inpatient   12/25/2013 1802 12/27/2013 1602 Full Code [id number]  Janece Canterbury, MD Inpatient   09/23/2013 0105 09/23/2013 1858 Full Code [id number]  Theodis Blaze, MD Inpatient   05/21/2013 1424 05/27/2013 1552 Full Code [id number]  Delfina Redwood, MD Inpatient   04/25/2013 2040 04/26/2013 1855 Full Code [id number]  Robbie Lis, MD Inpatient   03/22/2013 1526 03/23/2013 1720 Full Code [id number]  Olga Millers, MD Inpatient   03/22/2013 0220 03/22/2013 1526 Full Code [id number]  Olga Millers, MD  Inpatient   11/10/2011 2326 11/14/2011 1243 Full Code [id number]  Gwen Pounds, RN Inpatient      Home/SNF/Other Home  Chief Complaint Dehy   Level of Care/Admitting Diagnosis ED Disposition    ED Disposition Condition Comment   Admit  Hospital Area: [facility] [[id number]]  Level of Care: Telemetry [5]  Admit to tele based on following criteria: Complex arrhythmia (Bradycardia/Tachycardia)  Diagnosis: Hypokalemia [878676]  Admitting Physician: Norval Morton [[id number]]  Attending Physician: Norval Morton [[id number]]  PT Class (Do Not Modify): Observation [104]  PT Acc Code (Do Not Modify): Observation [10022]       Medical History Past Medical History:  Diagnosis Date  . Bilateral flank pain    due to ureteral stents  . Chronic hypokalemia   . CKD (chronic kidney disease), stage II   . 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 Complication of anesthesia    post op aspiration pneumonia w/ lap. appy 12-23-2007 (emergent ruptured appendix)  . FAP (familial adenomatous polyposis) 09/30/2014  . Heart murmur   . History of acute renal failure    2015;  2016  . History of fetal demise, not currently pregnant  03-22-2013  stillborn at 24 wks  . History of kidney stones   . History of sepsis    admitted 12-03-2016 (post-op day 2 w/ bilateral ureteral stents and stone manipluation) discharged 12-06-2016  for urosepsis  . Hypomagnesemia   . Ileostomy in place [facility])   . Iron deficiency anemia   . Nephrolithiasis    BILATERAL   . Normocytic anemia   . Renal cyst, left    PER CT 11-12-2016  . Urgency of urination     Allergies Allergies  Allergen Reactions  . Iron Palpitations    Heart stops  . Venofer [Ferric Oxide] Anaphylaxis  . Soap Hives, Itching and  Other (See Comments)     allergic to [employer].    . Food Hives and Other (See Comments)     allergic to orange juice.   . Morphine And Related Swelling    When given IV, swelling around site  . Sulfa Antibiotics Other (See Comments)    dehydration  . Sulfamethoxazole-Trimethoprim Nausea And Vomiting    Patient developed AKI that appears to have been from bactrim. She might be able to take again if watched closely and it is absolutely needed - ie there was no anaphylaxis or allergic rash  . Levaquin [Levofloxacin] Hives    IV Location/Drains/Wounds Patient Lines/Drains/Airways Status   Active Line/Drains/Airways    Name:   Placement date:   Placement time:   Site:   Days:   Peripheral IV 09/02/17 Right Forearm   09/02/17    2034    Forearm   314   Colostomy LUQ   -    -    LUQ      Ileostomy RLQ   06/26/13    -    RLQ   1843   Ureteral Drain/Stent Right ureter 5 Fr.   12/15/16    -    Right ureter   575   Incision (Closed) 12/01/16 Perineum Other (Comment)   12/01/16    1724     589   Incision (Closed) 12/15/16 Perineum Bilateral   12/15/16    1441     575          Labs/Imaging Results for orders placed or performed during the hospital encounter of 07/12/18 (from the past 48 hour(s))  Urinalysis, Routine w reflex microscopic     Status: Abnormal   Collection Time: 07/12/18  7:39 PM  Result Value Ref Range   Color, Urine YELLOW YELLOW   APPearance CLEAR CLEAR   Specific Gravity, Urine 1.006 1.005 - 1.030   pH 6.0 5.0 - 8.0   Glucose, UA NEGATIVE NEGATIVE mg/dL   Hgb urine dipstick SMALL (A) NEGATIVE   Bilirubin Urine NEGATIVE NEGATIVE   Ketones, ur NEGATIVE NEGATIVE mg/dL   Protein, ur NEGATIVE NEGATIVE mg/dL   Nitrite NEGATIVE NEGATIVE   Leukocytes, UA NEGATIVE NEGATIVE   RBC / HPF 0-5 0 - 5 RBC/hpf   WBC, UA 0-5 0 - 5 WBC/hpf   Bacteria, UA RARE (A) NONE SEEN   Squamous Epithelial / LPF 0-5 0 - 5    Comment: Performed at [facility], [place]  [address] [place], [place] [address]  Lipase, blood     Status: Abnormal   Collection Time: 07/12/18 10:00 PM  Result Value Ref Range   Lipase 66 (H) 11 - 51 U/L    Comment: Performed at [facility], [place] [address] [place], [place] [address]  Comprehensive metabolic panel  Status: Abnormal   Collection Time: 07/12/18 10:00 PM  Result Value Ref Range   Sodium 132 (L) 135 - 145 mmol/L   Potassium 2.6 (LL) 3.5 - 5.1 mmol/L    Comment: CRITICAL RESULT CALLED TO, READ BACK BY AND VERIFIED WITH: Alanson Aly RN [id number] 07/12/2018 HILL K    Chloride 104 98 - 111 mmol/L   CO2 19 (L) 22 - 32 mmol/L   Glucose, Bld 105 (H) 70 - 99 mg/dL   BUN 28 (H) 6 - 20 mg/dL   Creatinine, Ser 1.75 (H) 0.44 - 1.00 mg/dL   Calcium 8.7 (L) 8.9 - 10.3 mg/dL   Total Protein 7.1 6.5 - 8.1 g/dL   Albumin 3.9 3.5 - 5.0 g/dL   AST 18 15 - 41 U/L   ALT 13 0 - 44 U/L   Alkaline Phosphatase 63 38 - 126 U/L   Total Bilirubin 0.5 0.3 - 1.2 mg/dL   GFR calc non Af Amer 38 (L) >60 mL/min   GFR calc Af Amer 44 (L) >60 mL/min    Comment: (NOTE) The eGFR has been calculated using the CKD EPI equation. This calculation has not been validated in all clinical situations. eGFR's persistently <60 mL/min signify possible Chronic Kidney Disease.    Anion gap 9 5 - 15    Comment: Performed at [facility], [place] [address], [place], [place] [address]  CBC     Status: Abnormal   Collection Time: 07/12/18 10:00 PM  Result Value Ref Range   WBC 8.8 4.0 - 10.5 K/uL   RBC 4.41 3.87 - 5.11 MIL/uL   Hemoglobin 11.2 (L) 12.0 - 15.0 g/dL   HCT 34.1 (L) 36.0 - 46.0 %   MCV 77.3 (L) 78.0 - 100.0 fL   MCH 25.4 (L) 26.0 - 34.0 pg   MCHC 32.8 30.0 - 36.0 g/dL   RDW 20.5 (H) 11.5 - 15.5 %   Platelets 323 150 - 400 K/uL    Comment: Performed at [facility], [place] [address] [place],  [address]  I-Stat beta hCG blood, ED     Status: None   Collection Time: 07/12/18 11:02  PM  Result Value Ref Range   I-stat hCG, quantitative <5.0 <5 mIU/mL   Comment 3            Comment:   GEST. AGE      CONC.  (mIU/mL)   <=1 WEEK        5 - 50     2 WEEKS       50 - 500     3 WEEKS       100 - 10,000     4 WEEKS     1,000 - 30,000        FEMALE AND NON-PREGNANT FEMALE:     LESS THAN 5 mIU/mL    Ct Abdomen Pelvis W Contrast  Result Date: 07/13/2018 CLINICAL DATA:  Patient with abdominal pain. History of colon cancer. EXAM: CT ABDOMEN AND PELVIS WITH CONTRAST TECHNIQUE: Multidetector CT imaging of the abdomen and pelvis was performed using the standard protocol following bolus administration of intravenous contrast. CONTRAST:  146m ISOVUE-300 IOPAMIDOL (ISOVUE-300) INJECTION 61% COMPARISON:  CT abdomen pelvis 12/03/2016 FINDINGS: Lower chest: Normal heart size. Dependent atelectasis within the lower lobes bilaterally. No pleural effusion. No pleural effusion. Hepatobiliary: Liver is normal in size and contour. No focal hepatic lesion is identified. Gallbladder is unremarkable. No intrahepatic or extrahepatic biliary ductal dilatation.  Pancreas: Unremarkable Spleen: Unremarkable Adrenals/Urinary Tract: Adrenal glands are normal. Kidneys enhance symmetrically with contrast. Bilateral renal cysts. Interval removal bilateral double-J nephroureteral stents. Mild of active cysts bilaterally. Urinary bladder is unremarkable. Stomach/Bowel: Patient status post colectomy. Right anterior abdominal wall ileostomy. Normal morphology of the stomach. Vascular/Lymphatic: Normal caliber abdominal aorta. No retroperitoneal lymphadenopathy. Reproductive: Uterus and adnexal structures are unremarkable. Stable small amount of fluid the pelvis (image 68; series 2). Other: None. Musculoskeletal: No aggressive or acute appearing osseous lesions. IMPRESSION: 1. No acute process within the abdomen or pelvis. Electronically Signed   By: Lovey Newcomer M.D.   On: 07/13/2018 00:06    Pending Labs Unresulted Labs  (From admission, onward)    Start     Ordered   07/13/18 0500  Magnesium  Tomorrow morning,   R     07/13/18 0119   07/13/18 6381  Basic metabolic panel  Tomorrow morning,   R     07/13/18 0119   07/13/18 0500  CBC  Tomorrow morning,   R     07/13/18 0119          Vitals/Pain Today's Vitals   07/12/18 2343 07/12/18 2351 07/13/18 0044 07/13/18 0129  BP: (!) 151/97  (!) 80/46 95/64  Pulse: 69  84 85  Resp: 18  16 15   Temp:    98.7 F (37.1 C)  TempSrc:    Oral  SpO2: 100%  100% 100%  Weight:      Height:      PainSc:  9   9     Isolation Precautions No active isolations  Medications Medications  sodium chloride 0.9 % bolus 1,000 mL (has no administration in time range)  loperamide (IMODIUM) 1 MG/5ML solution 3 mg (has no administration in time range)  LORazepam (ATIVAN) tablet 1 mg (1 mg Oral Given 07/13/18 0153)  potassium chloride 20 MEQ/15ML (10%) solution 26.6667 mEq (has no administration in time range)  loratadine (CLARITIN) tablet 10 mg (has no administration in time range)  MULTIVITAMIN GUMMIES ADULT CHEW 2 each (has no administration in time range)  acidophilus (RISAQUAD) capsule 1 capsule (has no administration in time range)  magnesium oxide (MAG-OX) tablet 400 mg (400 mg Oral Given 07/13/18 0153)  citalopram (CELEXA) tablet 30 mg (has no administration in time range)  enoxaparin (LOVENOX) injection 40 mg (has no administration in time range)  sodium chloride flush (NS) 0.9 % injection 3 mL (3 mLs Intravenous Given 07/13/18 0156)  calcium gluconate 1 g in sodium chloride 0.9 % 100 mL IVPB (1 g Intravenous New Bag/Given 07/13/18 0155)  0.9 %  sodium chloride infusion (has no administration in time range)  ondansetron (ZOFRAN) tablet 4 mg (has no administration in time range)    Or  ondansetron (ZOFRAN) injection 4 mg (has no administration in time range)  acetaminophen (TYLENOL) tablet 650 mg (has no administration in time range)    Or  acetaminophen (TYLENOL)  suppository 650 mg (has no administration in time range)  sodium chloride 0.9 % bolus 1,000 mL (1,000 mLs Intravenous New Bag/Given 07/12/18 2142)  fentaNYL (SUBLIMAZE) injection 50 mcg (50 mcg Intravenous Given 07/12/18 2143)  potassium chloride 10 mEq in 100 mL IVPB (0 mEq Intravenous Stopped 07/13/18 0144)  iopamidol (ISOVUE-300) 61 % injection 100 mL (100 mLs Intravenous Contrast Given 07/12/18 2330)  potassium chloride (KLOR-CON) packet 40 mEq (40 mEq Oral Given 07/13/18 0127)    Mobility walks

## 2018-07-13 NOTE — Progress Notes (Signed)
PROGRESS NOTE    DIMOND CROTTY  HQP:[id number] DOB: [birth year] DOA: 07/12/2018 PCP: Nicola Girt, DO    Brief Narrative:  27 year old female who presented with abdominal pain.  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  Reported 48 hours of abdominal pain, colicky in nature, associated with decreased appetite, no no watery stools no nausea or vomiting.  Outpatient evaluation with chemistry showed hypokalemia, and she was advised to come to the hospital for further evaluation.  On her initial physical examination blood pressure was 95/58, heart rate 100, respiratory rate 18, temperature 98.7, oxygen saturation 100%.  Dry mucous membranes, lungs clear to auscultation bilaterally, heart S1-S2 present and rhythmic, abdomen was soft nontender, ileostomy in place, no lower extremity edema.  Sodium 132, potassium 2.6, chloride 104, bicarb 19, glucose 105, BUN 28, creatinine 1.75, lipase 66, AST 18, ALT 30, white count 8.8, hemoglobin 11.2, hematocrit 34.1, platelets 323.  Urinalysis specific gravity 1.005, 0-5 white cells, 0-5 red cells, hCG less than 5.0. CT of the abdomen with no acute changes.  Patient was admitted to the hospital with working diagnosis of severe hypokalemia due to dehydration and GI losses.  Assessment & Plan:   Principal Problem:   Hypokalemia Active Problems:   Microcytic anemia   High output ileostomy (HCC)   FAP (familial adenomatous polyposis)   Hypotension   Lower abdominal pain   Elevated lipase   1. Severe hypokalemia and hypomagnesemia due to GI losses (ileostomy) and dehydration. Will continue IV fluids with isotonic saline at 100 ml per hour (plus dextrose), continue aggressive K correction with Kcl. Will use IV formulation, since patient is not tolerating po  due to nausea and vomiting. Will continue telemetry monitoring. Today's K at 2,9 and Mg at 1,7 with serum bicarbonate at 14.   2. Pre-renal AKI on CKD stage 3. Will continue IV fluids for hydration, has been vomiting with po intake. Renal function with serum cr down to 1,25 from 1,75. Will follow on renal panel in am, avoid hypotension or nephrotoxic medications.   3. Right colonic adenocarcinoma. Will continue supportive medical therapy, on scheduled anti motility agents with imodium.   4. Chronic multifactorial anemia. Hb and hct have been stable with no signs of bleeding, will check iron panel to rule out iron deficiency.   5. Depression. Continue citalopram and lorazepam with good toleration.   DVT prophylaxis: enoxaparin   Code Status: full Family Communication: no family at the bedside  Disposition Plan/ discharge barriers: pending clinical improvement    Consultants:     Procedures:     Antimicrobials:       Subjective: Patient had episode of emesis this am with po kcl, abdominal pain has been improving but not back to baseline, no diarrhea.   Objective: Vitals:   07/13/18 0129 07/13/18 0233 07/13/18 0650 07/13/18 0846  BP: 95/64 99/65 (!) 89/56 (!) 97/58  Pulse: 85 82 63 83  Resp: 15 16 16    Temp: 98.7 F (37.1 C) (!) 97.5 F (36.4 C) (!) 97.5 F (36.4 C)   TempSrc: Oral Oral Oral   SpO2: 100% 100% 100% 96%  Weight:  47.3 kg    Height:  5\' 1"  (1.549 m)      Intake/Output Summary (Last 24 hours) at 07/13/2018 1207 Last data filed at 07/13/2018 1000 Gross per 24 hour  Intake 4279.26 ml  Output 1050 ml  Net 3229.26 ml   Filed Weights   07/12/18 1937 07/13/18 0233  Weight: 47.2 kg 47.3 kg    Examination:   General: Not in pain or dyspnea, deconditioned  Neurology: Awake and alert, non focal  E ENT: positive pallor, no icterus, oral mucosa moist Cardiovascular: No JVD. S1-S2 present, rhythmic, no gallops, rubs, or murmurs. No lower extremity  edema. Pulmonary: decreased breath sounds bilaterally, adequate air movement, no wheezing, rhonchi or rales. Gastrointestinal. Abdomen distended, but not tender, ileostomy bag in place, with no organomegaly, no rebound or guarding Skin. No rashes Musculoskeletal: no joint deformities     Data Reviewed: I have personally reviewed following labs and imaging studies  CBC: Recent Labs  Lab 07/12/18 2200 07/13/18 0452  WBC 8.8 6.8  HGB 11.2* 9.7*  HCT 34.1* 29.1*  MCV 77.3* 78.4  PLT 323 832   Basic Metabolic Panel: Recent Labs  Lab 07/12/18 2200 07/13/18 0452  NA 132* 130*  K 2.6* 2.9*  CL 104 112*  CO2 19* 14*  GLUCOSE 105* 103*  BUN 28* 21*  CREATININE 1.75* 1.25*  CALCIUM 8.7* 7.6*  MG  --  1.7   GFR: Estimated Creatinine Clearance: 49.6 mL/min (A) (by C-G formula based on SCr of 1.25 mg/dL (H)). Liver Function Tests: Recent Labs  Lab 07/12/18 2200  AST 18  ALT 13  ALKPHOS 63  BILITOT 0.5  PROT 7.1  ALBUMIN 3.9   Recent Labs  Lab 07/12/18 2200  LIPASE 66*   No results for input(s): AMMONIA in the last 168 hours. Coagulation Profile: No results for input(s): INR, PROTIME in the last 168 hours. Cardiac Enzymes: No results for input(s): CKTOTAL, CKMB, CKMBINDEX, TROPONINI in the last 168 hours. BNP (last 3 results) No results for input(s): PROBNP in the last 8760 hours. HbA1C: No results for input(s): HGBA1C in the last 72 hours. CBG: No results for input(s): GLUCAP in the last 168 hours. Lipid Profile: No results for input(s): CHOL, HDL, LDLCALC, TRIG, CHOLHDL, LDLDIRECT in the last 72 hours. Thyroid Function Tests: No results for input(s): TSH, T4TOTAL, FREET4, T3FREE, THYROIDAB in the last 72 hours. Anemia Panel: No results for input(s): VITAMINB12, FOLATE, FERRITIN, TIBC, IRON, RETICCTPCT in the last 72 hours.    Radiology Studies: I have reviewed all of the imaging during this hospital visit personally     Scheduled Meds: .  acidophilus  1 capsule Oral Daily  . citalopram  30 mg Oral Daily  . enoxaparin (LOVENOX) injection  40 mg Subcutaneous Q24H  . loperamide HCl  3 mg Oral Q8H  . multivitamin-iron-minerals-folic acid  2 tablet Oral Daily  . sodium chloride flush  10-40 mL Intracatheter Q12H  . sodium chloride flush  3 mL Intravenous Q12H   Continuous Infusions: . dextrose 5 % and 0.9% NaCl 100 mL/hr at 07/13/18 1155  . potassium chloride 10 mEq (07/13/18 1155)  . potassium chloride       LOS: 0 days        Tawni Millers, MD [facility] Pager [phone]

## 2018-07-13 NOTE — H&P (Signed)
History and Physical    Cindy [name] WGN:[id number] DOB: 12/04/1990 DOA: 07/12/2018  Referring MD/NP/PA: Kristeen Miss, MD PCP: Nicola Girt, DO  Patient coming from: home  Chief Complaint: Abdominal pain  I have personally briefly reviewed patient's old medical records in [place]   HPI: Cindy [name] is a 27 y.o. 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  She describes the pain as crampy and stabbing.  Reports having decreased appetite due to symptoms.  Denies having any nausea, vomiting, fever, chills, cough, or chest pain.  She is gone to see her primary care provider today and lab work was obtained which showed low potassium.  She was advised to come to the emergency department for treatment.  Patient reports that she has had similar symptoms like this previously in the past when her potassium is low requiring hospitalization. She reports compliance with potassium supplementation 30 mEq 3 times daily, and it had just been increased approximately 2 months ago from twice daily.  ED Course: Upon admission into the emergency department patient was noted to have soft blood pressures as low as 95/58.  Labs revealed normal blood pressures improved after receiving 2 L of normal saline IV fluids.  Labs revealed hemoglobin 11.2, potassium 2.6, BUN 28, creatinine 1.75, lipase 66, all other vitals relatively within normal limits. Urinalysis was negative for any signs of infection.   Review of Systems  Constitutional: Negative for chills and fever.  Eyes: Negative for photophobia and pain.  Respiratory: Negative for cough and shortness of breath.   Cardiovascular: Negative for chest pain and leg swelling.  Gastrointestinal: Positive for abdominal  pain. Negative for blood in stool, nausea and vomiting.  Genitourinary: Negative for dysuria and hematuria.  Musculoskeletal: Positive for myalgias. Negative for falls.  Neurological: Positive for weakness. Negative for loss of consciousness.  Psychiatric/Behavioral: Negative for substance abuse.     Past Medical History:  Diagnosis Date  . Bilateral flank pain    due to ureteral stents  . Chronic hypokalemia   . CKD (chronic kidney disease), stage II   . Colon cancer ([place]) DX  2014---  ONCOLOGIST AT [facility]   DX RIGHT ACSECDING CARCINOMA IN BACKGROUND FAP AND SEVERE MALNUTRITION-- Stage 2A  (pT3, N0, M0)  s/p subtotal colecotmy and completed protectomy and ileostomy w/ revision  . Complication of anesthesia    post op aspiration pneumonia w/ lap. appy 12-23-2007 (emergent ruptured appendix)  . FAP (familial adenomatous polyposis) 09/30/2014  . Heart murmur   . History of acute renal failure    2015;  2016  . History of fetal demise, not currently pregnant    03-22-2013  stillborn at 31 wks  . History of kidney stones   . History of sepsis    admitted 12-03-2016 (post-op day 2 w/ bilateral ureteral stents and stone manipluation) discharged 12-06-2016  for urosepsis  . Hypomagnesemia   . Ileostomy in place [facility])   . Iron deficiency anemia   . Nephrolithiasis    BILATERAL   . Normocytic anemia   . Renal cyst, left    PER CT 11-12-2016  . Urgency of urination     Past Surgical History:  Procedure Laterality Date  . COLONOSCOPY N/A 05/24/2013   Procedure: COLONOSCOPY;  Surgeon: Missy Sabins, MD;  Location: [facility];  Service: Endoscopy;  Laterality: N/A;  . COMPLETION PROTECTOMY AND REVISION ILEOSTOMY/ LYSIS ADHESIONS  07-28-2014    [facility]  . CYSTOSCOPY W/ RETROGRADES Right 12/15/2016   Procedure: CYSTOSCOPY WITH RETROGRADE PYELOGRAM;  Surgeon: Alexis Frock, MD;  Location: [facility];  Service: Urology;  Laterality: Right;  . CYSTOSCOPY W/ URETERAL STENT  PLACEMENT Bilateral 11/01/2016   Procedure: CYSTOSCOPY WITH BILATERAL RETROGRADE PYELOGRAM BILATERAL URETERAL STENT PLACEMENT;  Surgeon: Alexis Frock, MD;  Location: [facility];  Service: Urology;  Laterality: Bilateral;  . CYSTOSCOPY W/ URETERAL STENT PLACEMENT Right 12/15/2016   Procedure: CYSTOSCOPY WITH STENT REPLACEMENT;  Surgeon: Alexis Frock, MD;  Location: [facility];  Service: Urology;  Laterality: Right;  . CYSTOSCOPY W/ URETERAL STENT REMOVAL Bilateral 12/15/2016   Procedure: CYSTOSCOPY WITH STENT REMOVAL;  Surgeon: Alexis Frock, MD;  Location: [facility];  Service: Urology;  Laterality: Bilateral;  . CYSTOSCOPY WITH URETEROSCOPY Bilateral 12/01/2016   Procedure: FIRST STAGE CYSTOSCOPY WITH URETEROSCOPY,  STONE MANIPULATION and BASKETRY, STENT EXCHANGE;  Surgeon: Alexis Frock, MD;  Location: [facility];  Service: Urology;  Laterality: Bilateral;  . CYSTOSCOPY WITH URETEROSCOPY Right 12/15/2016   Procedure: SECOND STAGE -CYSTOSCOPY WITH URETEROSCOPY AND STONE EXTRACTION WITH BASKET;  Surgeon: Alexis Frock, MD;  Location[facility];  Service: Urology;  Laterality: Right;  . HOLMIUM LASER APPLICATION Bilateral 06/01/7208   Procedure: HOLMIUM LASER APPLICATION;  Surgeon: Alexis Frock, MD;  Location: [facility];  Service: Urology;  Laterality: Bilateral;  . HOLMIUM LASER APPLICATION Right 4/70/9628   Procedure: HOLMIUM LASER APPLICATION;  Surgeon: Alexis Frock, MD;  Location: [facility];  Service: Urology;  Laterality: Right;  . I&D EXTREMITY Left 11/14/2013   Procedure: IRRIGATION AND DEBRIDEMENT LEFT LONG FINGER;  Surgeon: Tennis Must, MD;  Location: [place];  Service: Orthopedics;  Laterality: Left;  . IM NAILING TIBIA Left 08/23/2010  . LAPAROSCOPIC APPENDECTOMY  12/23/2007  . LEG RECONSTRUCTION USING FASCIAL FLAP  ~ 2009  . SUBTOTAL COLECTOMY /  RESECTION ENBLOC DISTAL  ILEUM/  ILEOSTOMY  07/ 2014  [facility]  . TRANSTHORACIC ECHOCARDIOGRAM  11/11/2011   ef 55-60%/  mild MR/  trivial PR and TR/  PASP 29-16mmHg/  trivial pericardial effusion identified     reports that she has never smoked. She has never used smokeless tobacco. She reports that she does not drink alcohol or use drugs.  Allergies  Allergen Reactions  . Iron Palpitations    Heart stops  . Venofer [Ferric Oxide] Anaphylaxis  . Soap Hives, Itching and Other (See Comments)     allergic to [employer].    . Food Hives and Other (See Comments)     allergic to orange juice.   . Morphine And Related Swelling    When given IV, swelling around site  . Sulfa Antibiotics Other (See Comments)    dehydration  . Sulfamethoxazole-Trimethoprim Nausea And Vomiting    Patient developed AKI that appears to have been from bactrim. She might be able to take again if watched closely and it is absolutely needed - ie there was no anaphylaxis or allergic rash  . Levaquin [Levofloxacin] Hives    Family History  Adopted: Yes  Problem Relation Age of Onset  . ALS Mother   . Alcohol abuse Neg Hx   . Arthritis Neg Hx   . Asthma Neg Hx   . Birth defects Neg Hx   . Cancer Neg  Hx   . COPD Neg Hx   . Depression Neg Hx   . Diabetes Neg Hx   . Drug abuse Neg Hx   . Early death Neg Hx   . Hearing loss Neg Hx   . Heart disease Neg Hx   . Hyperlipidemia Neg Hx   . Hypertension Neg Hx   . Kidney disease Neg Hx   . Learning disabilities Neg Hx   . Mental illness Neg Hx   . Mental retardation Neg Hx   . Miscarriages / Stillbirths Neg Hx   . Stroke Neg Hx   . Vision loss Neg Hx     Prior to Admission medications   Medication Sig Start Date End Date Taking? Authorizing Provider  cetirizine (ZYRTEC) 10 MG tablet Take 10 mg by mouth daily as needed for allergies.  06/21/16  Yes [provider]  citalopram (CELEXA) 10 MG tablet Take 30 mg by mouth daily. 07/19/17  Yes [provider]  cyanocobalamin  (,VITAMIN B-12,) 1000 MCG/ML injection Inject 1,000 mcg into the muscle every 30 (thirty) days.   Yes [provider]  loperamide (IMODIUM) 1 MG/5ML solution Take 3 mg by mouth 3 (three) times daily as needed for diarrhea or loose stools.   Yes [provider]  LORazepam (ATIVAN) 1 MG tablet Take 1 mg by mouth 3 (three) times daily as needed for anxiety.   Yes [provider]  magnesium oxide (MAG-OX) 400 MG tablet Take 400 mg by mouth at bedtime.   Yes [provider]  Multiple Vitamins-Minerals (MULTIVITAMIN GUMMIES ADULT) CHEW Chew 2 each by mouth daily.    Yes [provider]  ondansetron (ZOFRAN) 4 MG tablet Take 1 tablet (4 mg total) by mouth every 8 (eight) hours as needed for nausea or vomiting. 09/04/17  Yes Bonnielee Haff, MD  potassium chloride 20 MEQ/15ML (10%) SOLN Take 20 mLs (26.6667 mEq total) by mouth 3 (three) times daily. 09/04/17  Yes Bonnielee Haff, MD  Probiotic Product (PROBIOTIC DAILY PO) Take 1 capsule by mouth daily.   Yes [provider]  Vitamin D, Ergocalciferol, (DRISDOL) 50000 units CAPS capsule Take 50,000 Units by mouth every Monday.  05/22/16  Yes [provider]    Physical Exam:  Constitutional: NAD, calm, comfortable Vitals:   07/12/18 1925 07/12/18 1937 07/12/18 2037 07/12/18 2343  BP: (!) 95/58   (!) 151/97  Pulse: 100  80 69  Resp: 18  17 18   Temp: 98.7 F (37.1 C)     TempSrc: Oral     SpO2: 100%  100% 100%  Weight:  47.2 kg    Height:  5' 1.5" (1.562 m)     Eyes: PERRL, lids and conjunctivae normal ENMT: Mucous membranes are moist. Posterior pharynx clear of any exudate or lesions.Normal dentition.  Neck: normal, supple, no masses, no thyromegaly Respiratory: clear to auscultation bilaterally, no wheezing, no crackles. Normal respiratory effort. No accessory muscle use.  Cardiovascular: Regular rate and rhythm, no murmurs / rubs / gallops. No extremity edema. 2+ pedal pulses. No  carotid bruits.  Abdomen: no tenderness, no masses palpated. No hepatosplenomegaly. Bowel sounds positive.  Musculoskeletal: no clubbing / cyanosis. No joint deformity upper and lower extremities. Good ROM, no contractures. Normal muscle tone.  Skin: no rashes, lesions, ulcers. No induration Neurologic: CN 2-12 grossly intact. Sensation intact, DTR normal. Strength 5/5 in all 4.  Psychiatric: Normal judgment and insight. Alert and oriented x 3. Normal mood.     Labs on  Admission: I have personally reviewed following labs and imaging studies  CBC: Recent Labs  Lab 07/12/18 2200  WBC 8.8  HGB 11.2*  HCT 34.1*  MCV 77.3*  PLT 710   Basic Metabolic Panel: Recent Labs  Lab 07/12/18 2200  NA 132*  K 2.6*  CL 104  CO2 19*  GLUCOSE 105*  BUN 28*  CREATININE 1.75*  CALCIUM 8.7*   GFR: Estimated Creatinine Clearance: 35.3 mL/min (A) (by C-G formula based on SCr of 1.75 mg/dL (H)). Liver Function Tests: Recent Labs  Lab 07/12/18 2200  AST 18  ALT 13  ALKPHOS 63  BILITOT 0.5  PROT 7.1  ALBUMIN 3.9   Recent Labs  Lab 07/12/18 2200  LIPASE 66*   No results for input(s): AMMONIA in the last 168 hours. Coagulation Profile: No results for input(s): INR, PROTIME in the last 168 hours. Cardiac Enzymes: No results for input(s): CKTOTAL, CKMB, CKMBINDEX, TROPONINI in the last 168 hours. BNP (last 3 results) No results for input(s): PROBNP in the last 8760 hours. HbA1C: No results for input(s): HGBA1C in the last 72 hours. CBG: No results for input(s): GLUCAP in the last 168 hours. Lipid Profile: No results for input(s): CHOL, HDL, LDLCALC, TRIG, CHOLHDL, LDLDIRECT in the last 72 hours. Thyroid Function Tests: No results for input(s): TSH, T4TOTAL, FREET4, T3FREE, THYROIDAB in the last 72 hours. Anemia Panel: No results for input(s): VITAMINB12, FOLATE, FERRITIN, TIBC, IRON, RETICCTPCT in the last 72 hours. Urine analysis:    Component Value Date/Time   COLORURINE  YELLOW 07/12/2018 1939   APPEARANCEUR CLEAR 07/12/2018 1939   LABSPEC 1.006 07/12/2018 1939   PHURINE 6.0 07/12/2018 1939   GLUCOSEU NEGATIVE 07/12/2018 1939   HGBUR SMALL (A) 07/12/2018 1939   BILIRUBINUR NEGATIVE 07/12/2018 1939   KETONESUR NEGATIVE 07/12/2018 1939   PROTEINUR NEGATIVE 07/12/2018 1939   UROBILINOGEN 0.2 07/29/2015 0320   NITRITE NEGATIVE 07/12/2018 1939   LEUKOCYTESUR NEGATIVE 07/12/2018 1939   Sepsis Labs: No results found for this or any previous visit (from the past 240 hour(s)).   Radiological Exams on Admission: Ct Abdomen Pelvis W Contrast  Result Date: 07/13/2018 CLINICAL DATA:  Patient with abdominal pain. History of colon cancer. EXAM: CT ABDOMEN AND PELVIS WITH CONTRAST TECHNIQUE: Multidetector CT imaging of the abdomen and pelvis was performed using the standard protocol following bolus administration of intravenous contrast. CONTRAST:  166mL ISOVUE-300 IOPAMIDOL (ISOVUE-300) INJECTION 61% COMPARISON:  CT abdomen pelvis 12/03/2016 FINDINGS: Lower chest: Normal heart size. Dependent atelectasis within the lower lobes bilaterally. No pleural effusion. No pleural effusion. Hepatobiliary: Liver is normal in size and contour. No focal hepatic lesion is identified. Gallbladder is unremarkable. No intrahepatic or extrahepatic biliary ductal dilatation. Pancreas: Unremarkable Spleen: Unremarkable Adrenals/Urinary Tract: Adrenal glands are normal. Kidneys enhance symmetrically with contrast. Bilateral renal cysts. Interval removal bilateral double-J nephroureteral stents. Mild of active cysts bilaterally. Urinary bladder is unremarkable. Stomach/Bowel: Patient status post colectomy. Right anterior abdominal wall ileostomy. Normal morphology of the stomach. Vascular/Lymphatic: Normal caliber abdominal aorta. No retroperitoneal lymphadenopathy. Reproductive: Uterus and adnexal structures are unremarkable. Stable small amount of fluid the pelvis (image 68; series 2). Other: None.  Musculoskeletal: No aggressive or acute appearing osseous lesions. IMPRESSION: 1. No acute process within the abdomen or pelvis. Electronically Signed   By: Lovey Newcomer M.D.   On: 07/13/2018 00:06    CT abdomen and pelvis: Independently reviewed.  No clear signs of obstruction, infection, or inflammation appreciated  Assessment/Plan Hypokalemia: Acute on chronic.  Patient presents  with potassium of 2.6 on admission with complaints of cramping.  Patient given 40 mEq p.o. and 30 mEq IV while in the emergency department.  Suspect output from ileostomy as cause of hypokalemia.  She has been increased on oral potassium supplementation to 30 mEq three times daily 2 months ago from twice daily.  - Admit to telemetry bed  - Check magnesium level - Continue home regimen of scheduled potassium May consider increasing dose  Hypotension: Acute. patient noted to have blood pressure as low as 80/46 on admission.  Patient treated with boluses of 2 L of normal saline IV fluids. - IV fluids at 125 mL/h, and bolus IV fluids as needed for low blood pressures.  Lower abdominal pain, elevated lipase: Acute.  Patient complains of abdominal pain found to have mildly elevated lipase at 66.  CT imaging of the abdomen without contrast did not show any acute abnormalities such as signs of pancreatitis, obstruction, or infection.  Question cause of symptoms at this time. - Hydrocodone as needed pain  - IV fluids as seen above  Acute kidney injury superimposed on chronic kidney disease stage III: Initial creatinine 1.75 with BUN 28.  Patient's baseline creatinine appears to be near 1.4 possibly looking at records from care everywhere.  Patient was given 2 L in the ED. - IV fluids as seen above - Recheck kidney function in a.m.  Right colonic adenocarcinoma in the background of FAP s/p colectomy with terminal ileum resection 2015, high output ileostomy: Patient likely with high output as cause of above symptoms.  Patient  taking Imodium as needed. - Monitor intake and output - Scheduled Imodium  Microcytic hypochromic anemia: Patient with hemoglobin of 11.2 with low MCV and MCH.  Suspect iron deficiency. - Recommend multivitamin with iron - Recheck CBC in a.m.  Anxiety - Continue Celexa and Ativan as needed   DVT prophylaxis: Lovenox Code Status: Full Family Communication: No family present at bedside Disposition Plan: Likely discharge home in 1 to 2 days Consults called: None Admission status: Observation  Norval Morton MD [facility] Pager [phone]   If 7PM-7AM, please contact night-coverage [email or web address] Password [facility]  07/13/2018, 12:43 AM

## 2018-07-14 DIAGNOSIS — R748 Abnormal levels of other serum enzymes: Secondary | ICD-10-CM

## 2018-07-14 LAB — BASIC METABOLIC PANEL
Anion gap: 4 — ABNORMAL LOW (ref 5–15)
Anion gap: 2 — ABNORMAL LOW (ref 5–15)
BUN: 11 mg/dL (ref 6–20)
BUN: 8 mg/dL (ref 6–20)
CO2: 16 mmol/L — ABNORMAL LOW (ref 22–32)
CO2: 17 mmol/L — ABNORMAL LOW (ref 22–32)
CREATININE: 1.29 mg/dL — AB (ref 0.44–1.00)
Calcium: 7.3 mg/dL — ABNORMAL LOW (ref 8.9–10.3)
Calcium: 7.5 mg/dL — ABNORMAL LOW (ref 8.9–10.3)
Chloride: 118 mmol/L — ABNORMAL HIGH (ref 98–111)
Chloride: 119 mmol/L — ABNORMAL HIGH (ref 98–111)
Creatinine, Ser: 1.34 mg/dL — ABNORMAL HIGH (ref 0.44–1.00)
GFR calc non Af Amer: 53 mL/min — ABNORMAL LOW (ref >60)
GFR, EST NON AFRICAN AMERICAN: 55 mL/min — AB (ref >60)
Glucose, Bld: 111 mg/dL — ABNORMAL HIGH (ref 70–99)
Glucose, Bld: 111 mg/dL — ABNORMAL HIGH (ref 70–99)
POTASSIUM: 3 mmol/L — AB (ref 3.5–5.1)
POTASSIUM: 3.1 mmol/L — AB (ref 3.5–5.1)
SODIUM: 138 mmol/L (ref 135–145)
SODIUM: 138 mmol/L (ref 135–145)

## 2018-07-14 LAB — FERRITIN: Ferritin: 38 ng/mL (ref 11–307)

## 2018-07-14 LAB — IRON AND TIBC
Iron: 25 ug/dL — ABNORMAL LOW (ref 28–170)
SATURATION RATIOS: 11 % (ref 10.4–31.8)
TIBC: 227 ug/dL — ABNORMAL LOW (ref 250–450)
UIBC: 202 ug/dL

## 2018-07-14 LAB — TRANSFERRIN: Transferrin: 162 mg/dL — ABNORMAL LOW (ref 192–382)

## 2018-07-14 LAB — MAGNESIUM: MAGNESIUM: 1.6 mg/dL — AB (ref 1.7–2.4)

## 2018-07-14 MED ORDER — POTASSIUM CHLORIDE 10 MEQ/100ML IV SOLN
10.0000 meq | INTRAVENOUS | Status: AC
  Administered 2018-07-14 (×2): 10 meq via INTRAVENOUS
  Filled 2018-07-14 (×2): qty 100

## 2018-07-14 MED ORDER — MAGNESIUM SULFATE 2 GM/50ML IV SOLN
2.0000 g | Freq: Once | INTRAVENOUS | Status: AC
  Administered 2018-07-14: 2 g via INTRAVENOUS
  Filled 2018-07-14: qty 50

## 2018-07-14 MED ORDER — POTASSIUM CHLORIDE 20 MEQ PO PACK
40.0000 meq | PACK | Freq: Once | ORAL | Status: AC
  Administered 2018-07-14: 40 meq via ORAL
  Filled 2018-07-14: qty 2

## 2018-07-14 MED ORDER — HEPARIN SOD (PORK) LOCK FLUSH 100 UNIT/ML IV SOLN
500.0000 [IU] | INTRAVENOUS | Status: AC | PRN
  Administered 2018-07-14: 500 [IU]

## 2018-07-14 MED ORDER — POTASSIUM CHLORIDE 10 MEQ/100ML IV SOLN
10.0000 meq | INTRAVENOUS | Status: AC
  Administered 2018-07-14 (×4): 10 meq via INTRAVENOUS
  Filled 2018-07-14 (×4): qty 100

## 2018-07-14 NOTE — Discharge Summary (Addendum)
Physician Discharge Summary  Cindy [name] IOX:[id number] DOB: December 25, 1990 DOA: 07/12/2018  PCP: Nicola Girt, DO  Admit date: 07/12/2018 Discharge date: 07/14/2018  Admitted From: Home  Disposition:  Home   Recommendations for Outpatient Follow-up and new medication changes:  1. Follow up with Dr. Suzy Bouchard in 7 days 2. Continue potassium repletion, follow renal panel in 7 days.  3. Repeat potassium 3.1 with a magnesium of 1.6, patient will received 40 mEq of potassium chloride oral and 20 mg IV +2 g of magnesium sulfate.  (patient leaving 18:30 to 19:00) 4. Patient has requested to be discharged, she does have a court appointment tomorrow at 8 AM.  I explained her that I will recommend potassium to be at least 3.5, and offer help in terms of an excuse from court in order her to stay in the hospital for further potassium repletion.  She prefers to be discharged, I explained her the consequences of low potassium including arrhythmias and death, she is aware and understands and prefers to be discharged, I recommend her to continue 20 meq 3 times daily of potassium chloride and have a renal panel check in the morning with her gastroenterologist at [facility].  She agrees with this plan and will proceed with follow-up in the morning for outpatient chemistry testing.  Home Health: no   Equipment/Devices: no    Discharge Condition: stable  CODE STATUS: full  Diet recommendation: heart healthy  Brief/Interim Summary: 27 year old female who presented with abdominal pain.  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  Reported 48 hours of abdominal pain, colicky in nature, associated with decreased appetite, but no watery stools, nausea or vomiting.  Outpatient evaluation with chemistry showed hypokalemia,  and she was advised to come to the hospital for further evaluation.  On her initial physical examination blood pressure was 95/58, heart rate 100, respiratory rate 18, temperature 98.7, oxygen saturation 100%.  Dry mucous membranes, lungs clear to auscultation bilaterally, heart S1-S2 present and rhythmic, abdomen was soft nontender, ileostomy in place, no lower extremity edema.  Sodium 132, potassium 2.6, chloride 104, bicarb 19, glucose 105, BUN 28, creatinine 1.75, lipase 66, AST 18, ALT 30, white count 8.8, hemoglobin 11.2, hematocrit 34.1, platelets 323.  Urinalysis specific gravity 1.005, 0-5 white cells, 0-5 red cells, hCG less than 5.0. CT of the abdomen with no acute changes.  Patient was admitted to the hospital with working diagnosis of severe hypokalemia due to dehydration and GI losses complicated by AKI and non gap metabolic acidosis.   1.  Severe hypokalemia and hypomagnesemia due to GI losses (ileostomy) and dehydration.  Patient was admitted to the medical ward, she was placed on IV isotonic saline, potassium and magnesium were corrected with potassium chloride and magnesium sulfate.  Abdominal pain improved as well as her oral intake. At discharge will continue 20 meq kcl tid.   2.  Pre-renal acute kidney injury on chronic kidney disease stage III.  Patient responded well to isotonic IV fluids, her discharge creatinine is 1.34 with a sodium of 138.  3.  Iron deficiency anemia.  Her hemoglobin and hematocrit remained stable at 9.7/29.1.  Further work-up with iron studies showed iron deficiency with a serum iron 25, transferrin 162, TIBC 227, ferritin 38 and transferrin saturation of 11.  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  4.  Right colonic adenocarcinoma status post resection.  Continue supportive medical therapy, follow-up as an outpatient.  Continue antimotility agents with Imodium.  5.  Depression.  Patient was continued on  lorazepam with no major complications.       Discharge Diagnoses:  Principal Problem:   Hypokalemia Active Problems:   Microcytic anemia   High output ileostomy (HCC)   FAP (familial adenomatous polyposis)   Hypotension   Lower abdominal pain   Elevated lipase    Discharge Instructions   Allergies as of 07/14/2018      Reactions   Iron Palpitations   Heart stops   Venofer [ferric Oxide] Anaphylaxis   Soap Hives, Itching, Other (See Comments)    allergic to [employer].     Food Hives, Other (See Comments)    allergic to orange juice.    Morphine And Related Swelling   When given IV, swelling around site   Sulfa Antibiotics Other (See Comments)   dehydration   Sulfamethoxazole-trimethoprim Nausea And Vomiting   Patient developed AKI that appears to have been from bactrim. She might be able to take again if watched closely and it is absolutely needed - ie there was no anaphylaxis or allergic rash   Levaquin [levofloxacin] Hives      Medication List    STOP taking these medications   citalopram 10 MG tablet Commonly known as:  CELEXA     TAKE these medications   cetirizine 10 MG tablet Commonly known as:  ZYRTEC Take 10 mg by mouth daily as needed for allergies.   cyanocobalamin 1000 MCG/ML injection Commonly known as:  (VITAMIN B-12) Inject 1,000 mcg into the muscle every 30 (thirty) days.   loperamide 1 MG/5ML solution Commonly known as:  IMODIUM Take 3 mg by mouth 3 (three) times daily as needed for diarrhea or loose stools.   LORazepam 1 MG tablet Commonly known as:  ATIVAN Take 1 mg by mouth 3 (three) times daily as needed for anxiety.   magnesium oxide 400 MG tablet Commonly known as:  MAG-OX Take 400 mg by mouth at bedtime.   MULTIVITAMIN GUMMIES ADULT Chew Chew 2 each by mouth daily.   ondansetron 4 MG tablet Commonly known as:  ZOFRAN Take 1 tablet (4 mg total) by mouth every 8 (eight) hours as needed for nausea or vomiting.   potassium  chloride 20 MEQ/15ML (10%) Soln Take 20 mLs (26.6667 mEq total) by mouth 3 (three) times daily.   PROBIOTIC DAILY PO Take 1 capsule by mouth daily.   Vitamin D (Ergocalciferol) 50000 units Caps capsule Commonly known as:  DRISDOL Take 50,000 Units by mouth every Monday.       Allergies  Allergen Reactions  . Iron Palpitations    Heart stops  . Venofer [Ferric Oxide] Anaphylaxis  . Soap Hives, Itching and Other (See Comments)     allergic to [employer].    . Food Hives and Other (See Comments)     allergic to orange juice.   . Morphine And Related Swelling    When given IV, swelling around site  . Sulfa Antibiotics Other (See Comments)    dehydration  . Sulfamethoxazole-Trimethoprim Nausea And Vomiting    Patient developed AKI that appears to have been from bactrim. She might be able to take again if watched closely and it is absolutely needed - ie there was no anaphylaxis or allergic rash  . Levaquin [Levofloxacin] Hives    Consultations:  Procedures/Studies: Ct Abdomen Pelvis W Contrast  Result Date: 07/13/2018 CLINICAL DATA:  Patient with abdominal pain. History of colon cancer. EXAM: CT ABDOMEN AND PELVIS WITH CONTRAST TECHNIQUE: Multidetector CT imaging of the abdomen and pelvis was performed using the standard protocol following bolus administration of intravenous contrast. CONTRAST:  116mL ISOVUE-300 IOPAMIDOL (ISOVUE-300) INJECTION 61% COMPARISON:  CT abdomen pelvis 12/03/2016 FINDINGS: Lower chest: Normal heart size. Dependent atelectasis within the lower lobes bilaterally. No pleural effusion. No pleural effusion. Hepatobiliary: Liver is normal in size and contour. No focal hepatic lesion is identified. Gallbladder is unremarkable. No intrahepatic or extrahepatic biliary ductal dilatation. Pancreas: Unremarkable Spleen: Unremarkable Adrenals/Urinary Tract: Adrenal glands are normal. Kidneys enhance symmetrically with contrast. Bilateral renal cysts. Interval removal  bilateral double-J nephroureteral stents. Mild of active cysts bilaterally. Urinary bladder is unremarkable. Stomach/Bowel: Patient status post colectomy. Right anterior abdominal wall ileostomy. Normal morphology of the stomach. Vascular/Lymphatic: Normal caliber abdominal aorta. No retroperitoneal lymphadenopathy. Reproductive: Uterus and adnexal structures are unremarkable. Stable small amount of fluid the pelvis (image 68; series 2). Other: None. Musculoskeletal: No aggressive or acute appearing osseous lesions. IMPRESSION: 1. No acute process within the abdomen or pelvis. Electronically Signed   By: Lovey Newcomer M.D.   On: 07/13/2018 00:06       Subjective: Patient is feeling better, no nausea or vomiting, no chest pain or dyspnea. Po intake has increased.   Discharge Exam: Vitals:   07/13/18 2159 07/14/18 0514  BP: (!) 94/55 (!) 99/56  Pulse: 79 75  Resp: 16 16  Temp: 98.1 F (36.7 C) 97.9 F (36.6 C)  SpO2: 100% 100%   Vitals:   07/13/18 0846 07/13/18 1422 07/13/18 2159 07/14/18 0514  BP: (!) 97/58 (!) 90/49 (!) 94/55 (!) 99/56  Pulse: 83 83 79 75  Resp:  16 16 16   Temp:  98.4 F (36.9 C) 98.1 F (36.7 C) 97.9 F (36.6 C)  TempSrc:  Oral Oral Oral  SpO2: 96% 100% 100% 100%  Weight:      Height:        General: Not in pain or dyspnea.  Neurology: Awake and alert, non focal  E ENT: mild pallor, no icterus, oral mucosa moist Cardiovascular: No JVD. S1-S2 present, rhythmic, no gallops, rubs, or murmurs. No lower extremity edema. Pulmonary: vesicular breath sounds bilaterally, adequate air movement, no wheezing, rhonchi or rales. Gastrointestinal. Abdomen with no organomegaly, non tender, no rebound or guarding. Ileostomy bag in place.  Skin. No rashes Musculoskeletal: no joint deformities   The results of significant diagnostics from this hospitalization (including imaging, microbiology, ancillary and laboratory) are listed below for reference.     Microbiology: No  results found for this or any previous visit (from the past 240 hour(s)).   Labs: BNP (last 3 results) No results for input(s): BNP in the last 8760 hours. Basic Metabolic Panel: Recent Labs  Lab 07/12/18 2200 07/13/18 0452 07/13/18 1658 07/14/18 0332  NA 132* 130* 137 138  K 2.6* 2.9* 3.1* 3.0*  CL 104 112* 118* 118*  CO2 19* 14* 16* 16*  GLUCOSE 105* 103* 141* 111*  BUN 28* 21* 13 11  CREATININE 1.75* 1.25* 1.29* 1.34*  CALCIUM 8.7* 7.6* 7.4* 7.3*  MG  --  1.7  --   --    Liver Function Tests: Recent Labs  Lab 07/12/18 2200  AST 18  ALT 13  ALKPHOS 63  BILITOT 0.5  PROT 7.1  ALBUMIN 3.9   Recent Labs  Lab 07/12/18 2200  LIPASE 66*   No results for input(s): AMMONIA in the last 168 hours. CBC: Recent Labs  Lab 07/12/18 2200 07/13/18 0452  WBC 8.8 6.8  HGB 11.2* 9.7*  HCT 34.1* 29.1*  MCV 77.3* 78.4  PLT 323 231   Cardiac Enzymes: No results for input(s): CKTOTAL, CKMB, CKMBINDEX, TROPONINI in the last 168 hours. BNP: Invalid input(s): POCBNP CBG: No results for input(s): GLUCAP in the last 168 hours. D-Dimer No results for input(s): DDIMER in the last 72 hours. Hgb A1c No results for input(s): HGBA1C in the last 72 hours. Lipid Profile No results for input(s): CHOL, HDL, LDLCALC, TRIG, CHOLHDL, LDLDIRECT in the last 72 hours. Thyroid function studies No results for input(s): TSH, T4TOTAL, T3FREE, THYROIDAB in the last 72 hours.  Invalid input(s): FREET3 Anemia work up Recent Labs    07/14/18 0332  FERRITIN 38  TIBC 227*  IRON 25*   Urinalysis    Component Value Date/Time   COLORURINE YELLOW 07/12/2018 1939   APPEARANCEUR CLEAR 07/12/2018 1939   LABSPEC 1.006 07/12/2018 1939   PHURINE 6.0 07/12/2018 1939   GLUCOSEU NEGATIVE 07/12/2018 1939   HGBUR SMALL (A) 07/12/2018 1939   BILIRUBINUR NEGATIVE 07/12/2018 1939   KETONESUR NEGATIVE 07/12/2018 1939   PROTEINUR NEGATIVE 07/12/2018 1939   UROBILINOGEN 0.2 07/29/2015 0320   NITRITE  NEGATIVE 07/12/2018 1939   LEUKOCYTESUR NEGATIVE 07/12/2018 1939   Sepsis Labs Invalid input(s): PROCALCITONIN,  WBC,  LACTICIDVEN Microbiology No results found for this or any previous visit (from the past 240 hour(s)).   Time coordinating discharge: 45 minutes  SIGNED:   Tawni Millers, MD  [facility] 07/14/2018, 11:21 AM Pager [phone]  If 7PM-7AM, please contact night-coverage [email or web address] Password [email or web address]

## 2018-07-15 IMAGING — US US RENAL
1 series · 14 of 25 positions shown · non-contrast
Comparison: Abdomen and pelvis CT dated 10/31/2016 and abdomen
radiograph dated 11/04/2016.

CLINICAL DATA: Left flank pain for the past 10 days. Recent
bilateral stent placement for hydronephrosis.

EXAM:
RENAL / URINARY TRACT ULTRASOUND COMPLETE

[Series 1: us renal · 0.19mm/px · 14 of 66 slices shown]
[im 1/66]
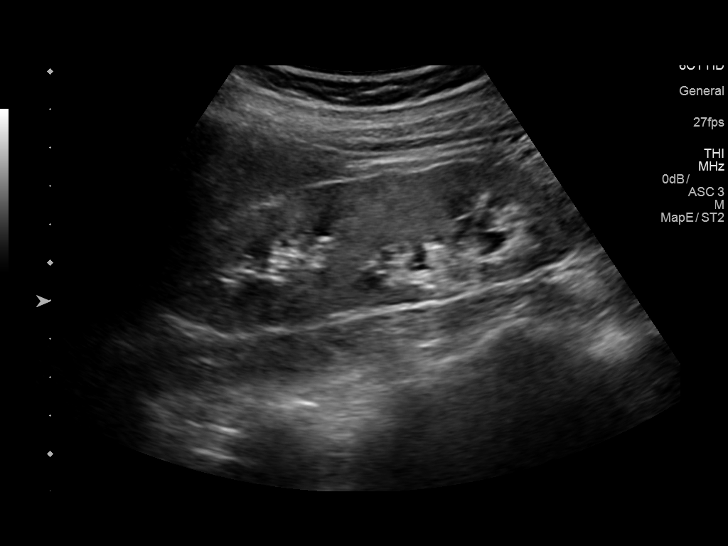
[im 6/66]
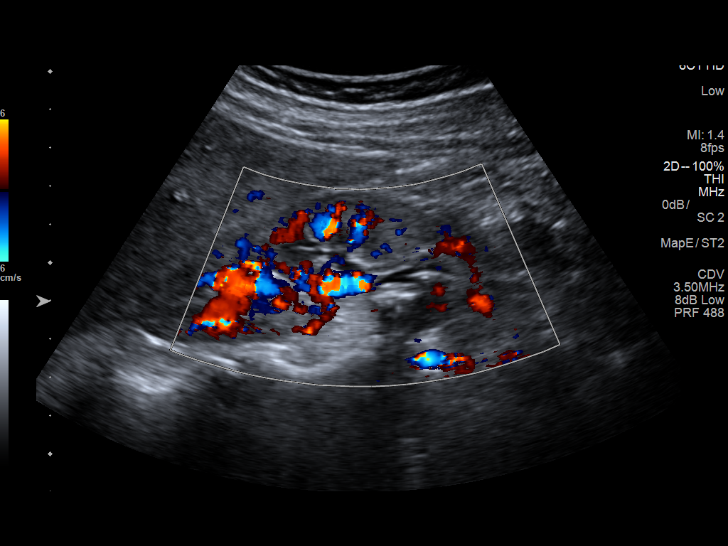
[im 11/66]
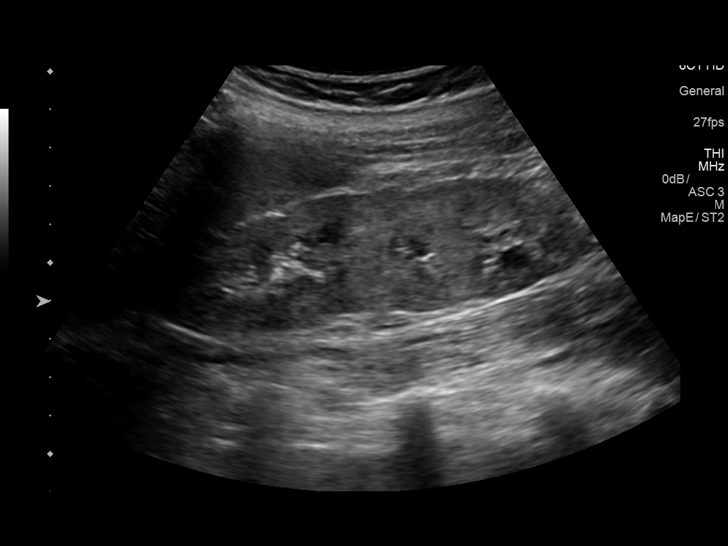
[im 17/66]
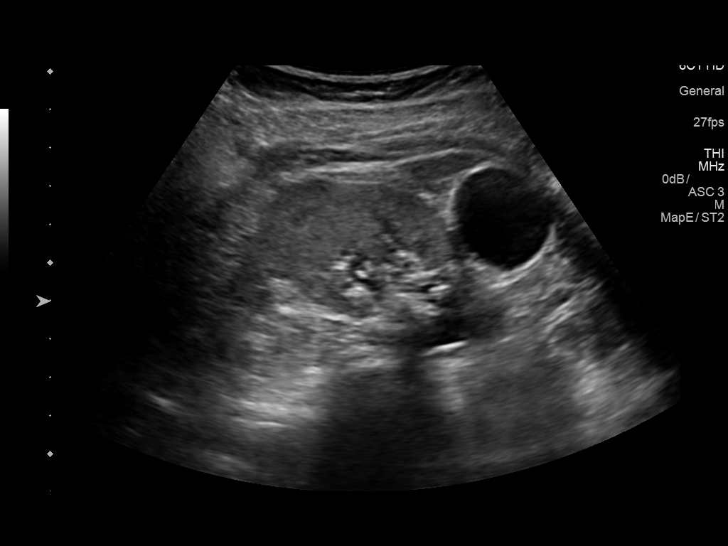
[im 22/66]
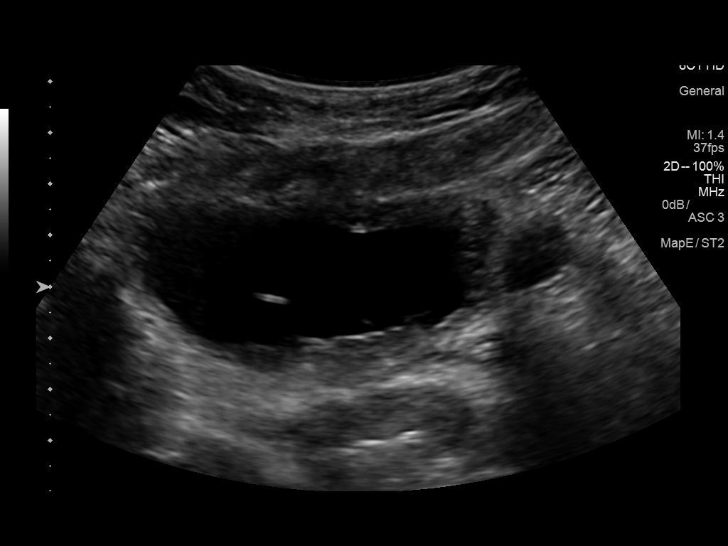
[im 25/66]
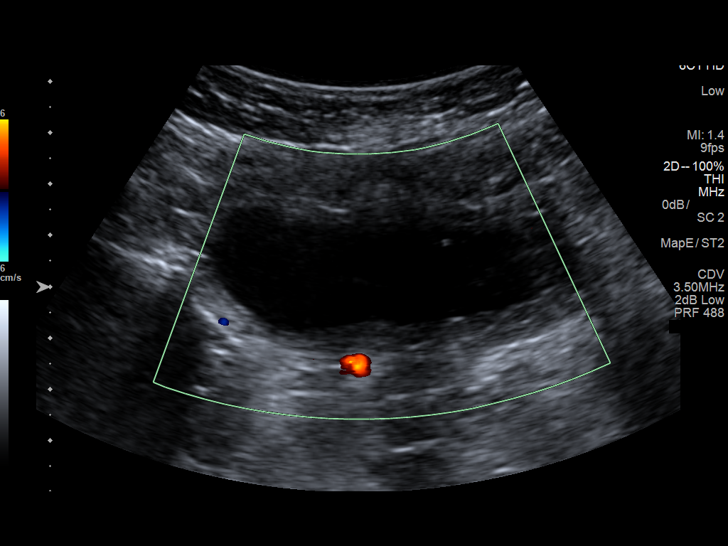
[im 30/66]
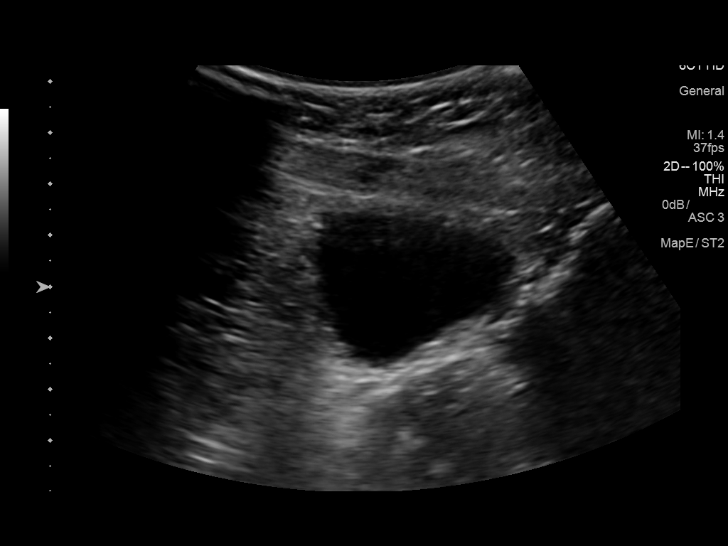
[im 36/66]
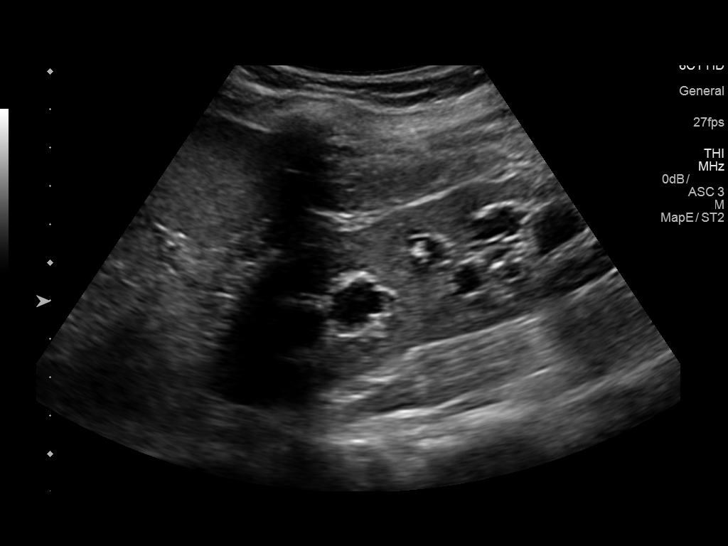
[im 41/66]
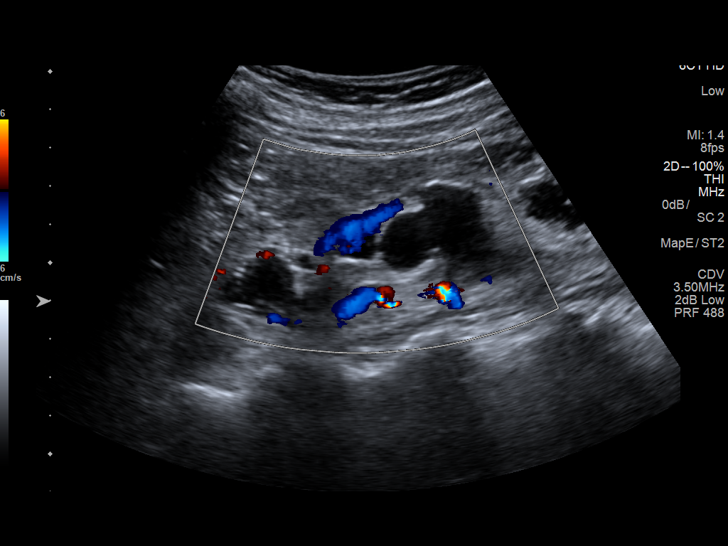
[im 44/66]
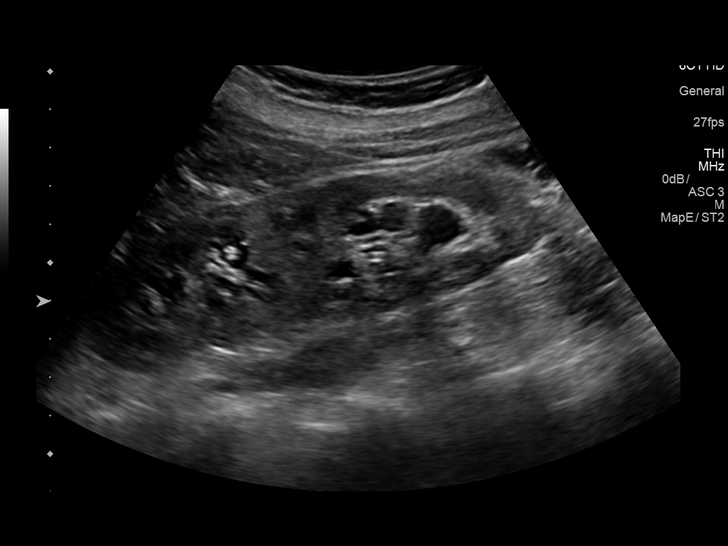
[im 49/66]
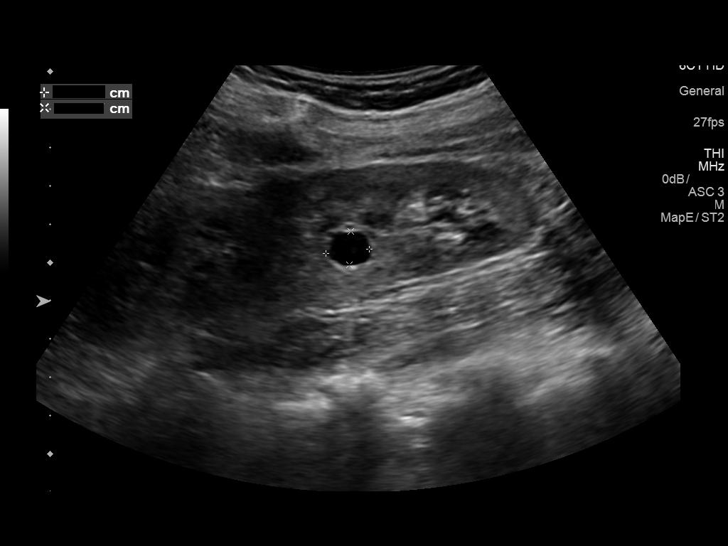
[im 55/66]
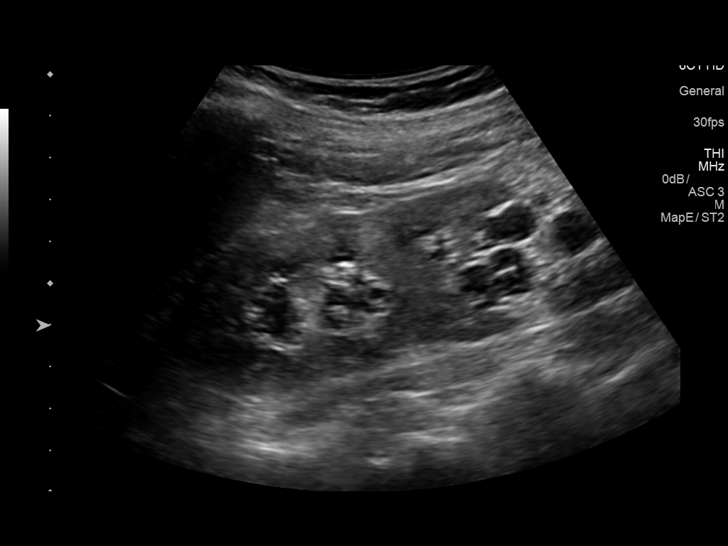
[im 60/66]
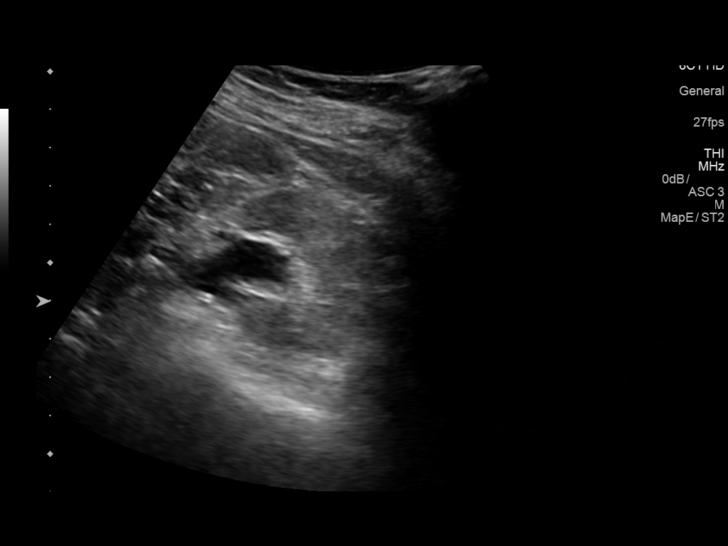
[im 66/66]
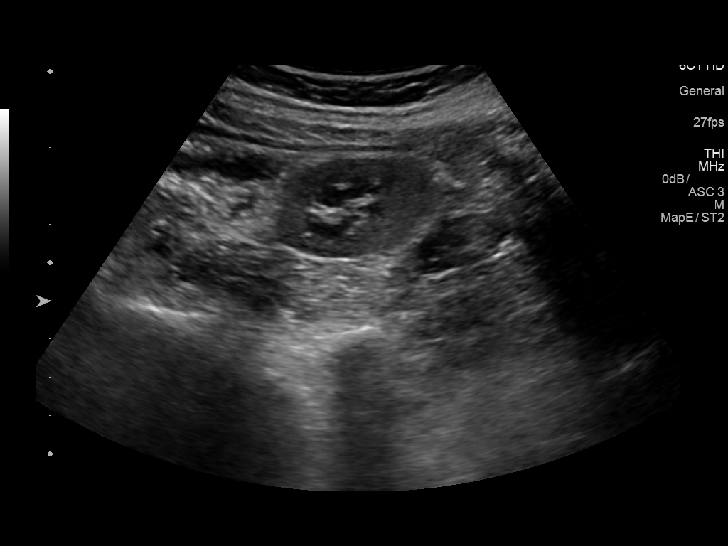

[14 of 25 positions shown; findings below may reference images not displayed]

FINDINGS: Right Kidney:

Length: 11.2 cm. Borderline echogenic. Mildly dilated collecting
system. 1.2 cm lower pole calculus.

Left Kidney:

Length: 12.6 cm. Borderline echogenic. Mild to moderate
hydronephrosis. 1.1 cm mid to lower cyst.

Bladder:

Ureteral stents.  Tiny calculus.  Anteflexed uterus.
IMPRESSION: 1. Mild right hydronephrosis and mild to moderate left
hydronephrosis.
2. 1.2 cm lower pole right renal calculus. This represents the renal
pelvis calculus seen on the recent CT.
3. Tiny bladder calculus.
4. 1.1 cm left renal cyst.

## 2018-10-30 ENCOUNTER — Emergency Department (HOSPITAL_COMMUNITY): Payer: Self-pay

## 2018-10-30 ENCOUNTER — Encounter (HOSPITAL_COMMUNITY): Payer: Self-pay | Admitting: Emergency Medicine

## 2018-10-30 ENCOUNTER — Emergency Department (HOSPITAL_COMMUNITY)
Admission: EM | Admit: 2018-10-30 | Discharge: 2018-10-30 | Disposition: A | Discharge status: Home / Self Care | Payer: Self-pay | Attending: Emergency Medicine | Admitting: Emergency Medicine

## 2018-10-30 DIAGNOSIS — Y93K1 Activity, walking an animal: Secondary | ICD-10-CM | POA: Insufficient documentation

## 2018-10-30 DIAGNOSIS — W19XXXA Unspecified fall, initial encounter: Secondary | ICD-10-CM

## 2018-10-30 DIAGNOSIS — Z79899 Other long term (current) drug therapy: Secondary | ICD-10-CM | POA: Insufficient documentation

## 2018-10-30 DIAGNOSIS — Y999 Unspecified external cause status: Secondary | ICD-10-CM | POA: Insufficient documentation

## 2018-10-30 DIAGNOSIS — Y929 Unspecified place or not applicable: Secondary | ICD-10-CM | POA: Insufficient documentation

## 2018-10-30 DIAGNOSIS — N182 Chronic kidney disease, stage 2 (mild): Secondary | ICD-10-CM | POA: Insufficient documentation

## 2018-10-30 DIAGNOSIS — Z932 Ileostomy status: Secondary | ICD-10-CM | POA: Insufficient documentation

## 2018-10-30 DIAGNOSIS — W108XXA Fall (on) (from) other stairs and steps, initial encounter: Secondary | ICD-10-CM | POA: Insufficient documentation

## 2018-10-30 DIAGNOSIS — S63501A Unspecified sprain of right wrist, initial encounter: Secondary | ICD-10-CM | POA: Insufficient documentation

## 2018-10-30 DIAGNOSIS — I129 Hypertensive chronic kidney disease with stage 1 through stage 4 chronic kidney disease, or unspecified chronic kidney disease: Secondary | ICD-10-CM | POA: Insufficient documentation

## 2018-10-30 DIAGNOSIS — Z85038 Personal history of other malignant neoplasm of large intestine: Secondary | ICD-10-CM | POA: Insufficient documentation

## 2018-10-30 DIAGNOSIS — S60811A Abrasion of right wrist, initial encounter: Secondary | ICD-10-CM | POA: Insufficient documentation

## 2018-10-30 MED ORDER — HYDROCODONE-ACETAMINOPHEN 5-325 MG PO TABS
1.0000 | ORAL_TABLET | Freq: Once | ORAL | Status: AC
  Administered 2018-10-30: 1 via ORAL
  Filled 2018-10-30: qty 1

## 2018-10-30 MED ORDER — KETOROLAC TROMETHAMINE 60 MG/2ML IM SOLN
30.0000 mg | Freq: Once | INTRAMUSCULAR | Status: DC
Start: 2018-10-30 — End: 2018-10-30

## 2018-10-30 MED ORDER — BACITRACIN ZINC 500 UNIT/GM EX OINT
TOPICAL_OINTMENT | Freq: Once | CUTANEOUS | Status: AC
  Administered 2018-10-30: 1 via TOPICAL
  Filled 2018-10-30: qty 0.9

## 2018-10-30 NOTE — ED Provider Notes (Signed)
[facility] DEPT Provider Note   CSN: [id number] Arrival date & time: 10/30/18  0807     History   Chief Complaint Chief Complaint  Patient presents with  . Fall  . Wrist Pain    HPI Cindy [name] is a 27 y.o. female with extensive PMH who presents to the ED with right wrist and hand pain s/p fall. Patient reports she was walking her dog this morning and the dog started running up the steps causing patient to fall on her right wrist. Patient denies head injury or LOC. She denies any other injuries. Patient reports being up to date on tetanus.   The history is provided by the patient. No language interpreter was used.  Fall  This is a new problem. The current episode started 6 to 12 hours ago. Pertinent negatives include no chest pain, no abdominal pain, no headaches and no shortness of breath. She has tried nothing for the symptoms.  Wrist Pain  This is a new problem. The current episode started 6 to 12 hours ago. The problem occurs constantly. The problem has not changed since onset.Pertinent negatives include no chest pain, no abdominal pain, no headaches and no shortness of breath. Nothing relieves the symptoms. She has tried nothing for the symptoms.    Past Medical History:  Diagnosis Date  . Bilateral flank pain    due to ureteral stents  . Chronic hypokalemia   . CKD (chronic kidney disease), stage II   . 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 Complication of anesthesia    post op aspiration pneumonia w/ lap. appy 12-23-2007 (emergent ruptured appendix)  . FAP (familial adenomatous polyposis) 09/30/2014  . Heart murmur   . History of acute renal failure    2015;  2016  . History of fetal demise, not currently pregnant    03-22-2013  stillborn at 28  wks  . History of kidney stones   . History of sepsis    admitted 12-03-2016 (post-op day 2 w/ bilateral ureteral stents and stone manipluation) discharged 12-06-2016  for urosepsis  . Hypomagnesemia   . Ileostomy in place [facility])   . Iron deficiency anemia   . Nephrolithiasis    BILATERAL   . Normocytic anemia   . Renal cyst, left    PER CT 11-12-2016  . Urgency of urination     Patient Active Problem List   Diagnosis Date Noted  . Lower abdominal pain 07/13/2018  . Elevated lipase 07/13/2018  . AKI (acute kidney injury) ([place]) 09/02/2017  . Dehydration 06/07/2017  . Sepsis, unspecified organism ([place]) 12/03/2016  . 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 Normocytic anemia 05/02/2015  . Hypotension   . 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 FAP (familial adenomatous polyposis) 09/30/2014  . Nephrolithiasis 09/30/2014  . Adenocarcinoma of colon ([place]) 05/26/2013  . Nausea with vomiting 05/21/2013  . Microcytic anemia 04/25/2013  . Hypokalemia 04/25/2013    Past Surgical History:  Procedure Laterality Date  . COLONOSCOPY N/A 05/24/2013   Procedure: COLONOSCOPY;  Surgeon: Missy Sabins, MD;  Location: [place];  Service: Endoscopy;  Laterality: N/A;  . COMPLETION PROTECTOMY AND REVISION ILEOSTOMY/ LYSIS ADHESIONS  07-28-2014    [facility]  . CYSTOSCOPY W/ RETROGRADES Right 12/15/2016   Procedure: CYSTOSCOPY  WITH RETROGRADE PYELOGRAM;  Surgeon: Alexis Frock, MD;  Location: [facility];  Service: Urology;  Laterality: Right;  . CYSTOSCOPY W/ URETERAL STENT PLACEMENT Bilateral 11/01/2016   Procedure: CYSTOSCOPY WITH BILATERAL RETROGRADE PYELOGRAM BILATERAL URETERAL STENT PLACEMENT;  Surgeon: Alexis Frock, MD;  [facility];  Service: Urology;  Laterality: Bilateral;  . CYSTOSCOPY W/ URETERAL STENT PLACEMENT Right 12/15/2016   Procedure: CYSTOSCOPY WITH STENT REPLACEMENT;  Surgeon: Alexis Frock, MD;  Location: [facility];  Service:  Urology;  Laterality: Right;  . CYSTOSCOPY W/ URETERAL STENT REMOVAL Bilateral 12/15/2016   Procedure: CYSTOSCOPY WITH STENT REMOVAL;  Surgeon: Alexis Frock, MD;  Location: [facility];  Service: Urology;  Laterality: Bilateral;  . CYSTOSCOPY WITH URETEROSCOPY Bilateral 12/01/2016   Procedure: FIRST STAGE CYSTOSCOPY WITH URETEROSCOPY,  STONE MANIPULATION and BASKETRY, STENT EXCHANGE;  Surgeon: Alexis Frock, MD;  Location: [facility];  Service: Urology;  Laterality: Bilateral;  . CYSTOSCOPY WITH URETEROSCOPY Right 12/15/2016   Procedure: SECOND STAGE -CYSTOSCOPY WITH URETEROSCOPY AND STONE EXTRACTION WITH BASKET;  Surgeon: Alexis Frock, MD;  Location: [facility];  Service: Urology;  Laterality: Right;  . HOLMIUM LASER APPLICATION Bilateral 07/01/4626   Procedure: HOLMIUM LASER APPLICATION;  Surgeon: Alexis Frock, MD;  Location: [facility];  Service: Urology;  Laterality: Bilateral;  . HOLMIUM LASER APPLICATION Right 0/35/0093   Procedure: HOLMIUM LASER APPLICATION;  Surgeon: Alexis Frock, MD;  Location: [facility] [place];  Service: Urology;  Laterality: Right;  . I&D EXTREMITY Left 11/14/2013   Procedure: IRRIGATION AND DEBRIDEMENT LEFT LONG FINGER;  Surgeon: Tennis Must, MD;  Location: [place];  Service: Orthopedics;  Laterality: Left;  . IM NAILING TIBIA Left 08/23/2010  . LAPAROSCOPIC APPENDECTOMY  12/23/2007  . LEG RECONSTRUCTION USING FASCIAL FLAP  ~ 2009  . SUBTOTAL COLECTOMY /  RESECTION ENBLOC DISTAL ILEUM/  ILEOSTOMY  07/ 2014  [facility]  . TRANSTHORACIC ECHOCARDIOGRAM  11/11/2011   ef 55-60%/  mild MR/  trivial PR and TR/  PASP 29-52mmHg/  trivial pericardial effusion identified     OB History    Gravida  1   Para  1   Term  0   Preterm  1   AB  0   Living        SAB  0   TAB  0   Ectopic  0   Multiple      Live Births               Home Medications    Prior to  Admission medications   Medication Sig Start Date End Date Taking? Authorizing Provider  cetirizine (ZYRTEC) 10 MG tablet Take 10 mg by mouth daily as needed for allergies.  06/21/16   [provider]  cyanocobalamin (,VITAMIN B-12,) 1000 MCG/ML injection Inject 1,000 mcg into the muscle every 30 (thirty) days.    [provider]  loperamide (IMODIUM) 1 MG/5ML solution Take 3 mg by mouth 3 (three) times daily as needed for diarrhea or loose stools.    [provider]  LORazepam (ATIVAN) 1 MG tablet Take 1 mg by mouth 3 (three) times daily as needed for anxiety.    [provider]  magnesium oxide (MAG-OX) 400 MG tablet Take 400 mg by mouth at bedtime.    [provider]  Multiple Vitamins-Minerals (MULTIVITAMIN GUMMIES ADULT) CHEW Chew 2 each by mouth daily.     [provider]  ondansetron [facility])  4 MG tablet Take 1 tablet (4 mg total) by mouth every 8 (eight) hours as needed for nausea or vomiting. 09/04/17   Bonnielee Haff, MD  potassium chloride 20 MEQ/15ML (10%) SOLN Take 20 mLs (26.6667 mEq total) by mouth 3 (three) times daily. 09/04/17   Bonnielee Haff, MD  Probiotic Product (PROBIOTIC DAILY PO) Take 1 capsule by mouth daily.    [provider]  Vitamin D, Ergocalciferol, (DRISDOL) 50000 units CAPS capsule Take 50,000 Units by mouth every Monday.  05/22/16   [provider]    Family History Family History  Adopted: Yes  Problem Relation Age of Onset  . ALS Mother   . Alcohol abuse Neg Hx   . Arthritis Neg Hx   . Asthma Neg Hx   . Birth defects Neg Hx   . Cancer Neg Hx   . COPD Neg Hx   . Depression Neg Hx   . Diabetes Neg Hx   . Drug abuse Neg Hx   . Early death Neg Hx   . Hearing loss Neg Hx   . Heart disease Neg Hx   . Hyperlipidemia Neg Hx   . Hypertension Neg Hx   . Kidney disease Neg Hx   . Learning disabilities Neg Hx   . Mental illness Neg Hx   . Mental retardation Neg Hx   . Miscarriages /  Stillbirths Neg Hx   . Stroke Neg Hx   . Vision loss Neg Hx     Social History Social History   Tobacco Use  . Smoking status: Never Smoker  . Smokeless tobacco: Never Used  Substance Use Topics  . Alcohol use: No  . Drug use: No     Allergies   Iron; Venofer [ferric oxide]; Soap; Food; Morphine and related; Sulfa antibiotics; Sulfamethoxazole-trimethoprim; and Levaquin [levofloxacin]   Review of Systems Review of Systems  Constitutional: Negative for diaphoresis.  HENT: Negative.   Respiratory: Negative for shortness of breath.   Cardiovascular: Negative for chest pain.  Gastrointestinal: Negative for abdominal pain, nausea and vomiting.  Musculoskeletal: Positive for arthralgias.  Skin: Positive for color change and wound.  Neurological: Negative for syncope and headaches.  Psychiatric/Behavioral: Negative for confusion.     Physical Exam Updated Vital Signs BP (!) 89/47 (BP Location: Left Arm) Comment: pt reports BP always low   Pulse (!) 115   Temp 97.7 F (36.5 C) (Oral)   Resp 17   Ht 5\' 1"  (1.549 m)   Wt 48.1 kg   LMP 10/14/2018   SpO2 99%   BMI 20.03 kg/m   Physical Exam  Constitutional: She appears well-developed and well-nourished. No distress.  HENT:  Head: Normocephalic and atraumatic.  Right Ear: Tympanic membrane normal.  Left Ear: Tympanic membrane normal.  Nose: Nose normal.  Mouth/Throat: Uvula is midline and oropharynx is clear and moist. Normal dentition.  Eyes: Pupils are equal, round, and reactive to light. Conjunctivae and EOM are normal.  Neck: Neck supple.  Cardiovascular: Tachycardia present.  Pulmonary/Chest: Effort normal. She exhibits no tenderness.  Abdominal: Soft. There is no tenderness.  Musculoskeletal:       Right wrist: She exhibits tenderness and swelling. Decreased range of motion: due to pain. Lacerations: abrasions.  Radial pulses 2+, adequate circulation. Swelling and abrasion noted to the ulnar aspect of the  right wrist. Tender on exam.   Neurological: She is alert.  Skin: Skin is warm and dry.  Psychiatric: She has a normal mood and affect. Her behavior is  normal.  Nursing note and vitals reviewed.    ED Treatments / Results  Labs (all labs ordered are listed, but only abnormal results are displayed) Labs Reviewed - No data to display  Radiology Dg Wrist Complete Right  Result Date: 10/30/2018 CLINICAL DATA:  Fall.  Pain. EXAM: RIGHT WRIST - COMPLETE 3+ VIEW COMPARISON:  None. FINDINGS: There is no evidence of fracture or dislocation. There is no evidence of arthropathy or other focal bone abnormality. Soft tissues are unremarkable. IMPRESSION: Negative. Electronically Signed   By: Staci Righter M.D.   On: 10/30/2018 09:21   Dg Hand Complete Right  Result Date: 10/30/2018 CLINICAL DATA:  Fall.  Pain. EXAM: RIGHT HAND - COMPLETE 3+ VIEW COMPARISON:  None. FINDINGS: There is no evidence of fracture or dislocation. There is no evidence of arthropathy or other focal bone abnormality. Soft tissues are unremarkable. IMPRESSION: Negative. Electronically Signed   By: Staci Righter M.D.   On: 10/30/2018 09:20   I reviewed patient's VS from past visits and on most visits her blood pressures were similar to today's visit. I discussed with the patient vital signs. She reports her BP is always low. She denies fever, cough or any problems other than the fall and reports feeling like she usually does other than the wrist pain. Patient appears stable for d/c without fracture or dislocation noted on x-ray and no focal neuro deficits. Wound care, bacitracin ointment, dressing and wrist splint. F/u with PCP or return here for problems.   Procedures Procedures (including critical care time)  Medications Ordered in ED Medications  HYDROcodone-acetaminophen (NORCO/VICODIN) 5-325 MG per tablet 1 tablet (1 tablet Oral Given 10/30/18 1324)  bacitracin ointment (1 application Topical Given 10/30/18 1324)   I  discussed this case with Dr. Sherry Ruffing.  Initial Impression / Assessment and Plan / ED Course  I have reviewed the triage vital signs and the nursing notes.   Final Clinical Impressions(s) / ED Diagnoses   Final diagnoses:  Fall, initial encounter  Right wrist sprain, initial encounter  Abrasion of right wrist, initial encounter    ED Discharge Orders    None       Debroah Baller [place], NP 10/30/18 [place], Gwenyth Allegra, MD 10/30/18 [phone]

## 2018-10-30 NOTE — Discharge Instructions (Addendum)
If you have any problems return to the ED

## 2018-10-30 NOTE — ED Notes (Signed)
Bed: WTR5 Expected date:  Expected time:  Means of arrival:  Comments: 

## 2018-10-30 NOTE — ED Triage Notes (Signed)
Pt reports she was walking her dog this morning and fell into the stairwell. Pt has pain and swelling to right wrist, hand.

## 2019-07-09 ENCOUNTER — Other Ambulatory Visit: Payer: Self-pay

## 2019-07-09 ENCOUNTER — Encounter (HOSPITAL_COMMUNITY): Payer: Self-pay

## 2019-07-09 ENCOUNTER — Ambulatory Visit (HOSPITAL_COMMUNITY)
Admission: EM | Admit: 2019-07-09 | Discharge: 2019-07-09 | Disposition: A | Discharge status: Home / Self Care | Payer: PRIVATE HEALTH INSURANCE | Attending: Family Medicine | Admitting: Family Medicine

## 2019-07-09 DIAGNOSIS — R3915 Urgency of urination: Secondary | ICD-10-CM | POA: Diagnosis not present

## 2019-07-09 DIAGNOSIS — I959 Hypotension, unspecified: Secondary | ICD-10-CM | POA: Insufficient documentation

## 2019-07-09 DIAGNOSIS — E43 Unspecified severe protein-calorie malnutrition: Secondary | ICD-10-CM | POA: Insufficient documentation

## 2019-07-09 DIAGNOSIS — Z79899 Other long term (current) drug therapy: Secondary | ICD-10-CM | POA: Diagnosis not present

## 2019-07-09 DIAGNOSIS — R0982 Postnasal drip: Secondary | ICD-10-CM | POA: Diagnosis not present

## 2019-07-09 DIAGNOSIS — J029 Acute pharyngitis, unspecified: Secondary | ICD-10-CM | POA: Insufficient documentation

## 2019-07-09 DIAGNOSIS — D509 Iron deficiency anemia, unspecified: Secondary | ICD-10-CM | POA: Diagnosis not present

## 2019-07-09 DIAGNOSIS — Z20828 Contact with and (suspected) exposure to other viral communicable diseases: Secondary | ICD-10-CM | POA: Diagnosis not present

## 2019-07-09 DIAGNOSIS — T7840XA Allergy, unspecified, initial encounter: Secondary | ICD-10-CM | POA: Diagnosis not present

## 2019-07-09 DIAGNOSIS — R011 Cardiac murmur, unspecified: Secondary | ICD-10-CM | POA: Insufficient documentation

## 2019-07-09 DIAGNOSIS — N182 Chronic kidney disease, stage 2 (mild): Secondary | ICD-10-CM | POA: Diagnosis not present

## 2019-07-09 DIAGNOSIS — Z87898 Personal history of other specified conditions: Secondary | ICD-10-CM

## 2019-07-09 DIAGNOSIS — Z1159 Encounter for screening for other viral diseases: Secondary | ICD-10-CM

## 2019-07-09 DIAGNOSIS — M791 Myalgia, unspecified site: Secondary | ICD-10-CM

## 2019-07-09 DIAGNOSIS — R05 Cough: Secondary | ICD-10-CM

## 2019-07-09 LAB — POCT RAPID STREP A: Streptococcus, Group A Screen (Direct): NEGATIVE

## 2019-07-09 MED ORDER — CETIRIZINE HCL 10 MG PO TABS: 10.0000 mg | ORAL_TABLET | Freq: Every day | ORAL | 0 refills | Status: DC

## 2019-07-09 NOTE — Discharge Instructions (Addendum)
Your rapid strep test was negative.  We will send for culture. COVID swab sent for testing.  We should have those results in a couple days. I believe your symptoms are most likely allergy related.  Zyrtec sent to the pharmacy for you to take daily. Work note given  Follow up as needed for continued or worsening symptoms

## 2019-07-09 NOTE — ED Triage Notes (Signed)
Patient presents to Urgent Care with complaints of sore throat and generalized body aches since yesterday. Patient reports she works at [employer] and they recommended she come get tested.

## 2019-07-09 NOTE — ED Provider Notes (Signed)
[facility]    CSN: [id number] Arrival date & time: 07/09/19  7124     History   Chief Complaint Chief Complaint  Patient presents with  . Sore Throat  . Generalized Body Aches    HPI Cindy [name] is a 28 y.o. female.   Patient is a 28 year old female with past medical history of chronic hypokalemia, CKD, colon cancer, nephrolithiasis, sepsis, anemia.  She presents today with approximately 1 day of generalized body aches, sore throat.  She has had mild cough with postnasal drip.  History of allergies.  Does not take any allergy medicine.  Based on symptoms she was sent here by her job for [employer] testing.  Denies any fever, chills.  Denies any ear pain, chest pain or shortness of breath.  No known COVID exposures or recent traveling.  ROS per HPI      Past Medical History:  Diagnosis Date  . Bilateral flank pain    due to ureteral stents  . Chronic hypokalemia   . CKD (chronic kidney disease), stage II   . 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 Complication of anesthesia    post op aspiration pneumonia w/ lap. appy 12-23-2007 (emergent ruptured appendix)  . FAP (familial adenomatous polyposis) 09/30/2014  . Heart murmur   . History of acute renal failure    2015;  2016  . History of fetal demise, not currently pregnant    03-22-2013  stillborn at 60 wks  . History of kidney stones   . History of sepsis    admitted 12-03-2016 (post-op day 2 w/ bilateral ureteral stents and stone manipluation) discharged 12-06-2016  for urosepsis  . Hypomagnesemia   . Ileostomy in place [facility])   . Iron deficiency anemia   . Nephrolithiasis    BILATERAL   . Normocytic anemia   . Renal cyst, left    PER CT 11-12-2016  . Urgency of urination     Patient Active Problem List   Diagnosis Date Noted  . Lower abdominal pain 07/13/2018  . Elevated lipase 07/13/2018  . AKI (acute kidney injury) ([place]) 09/02/2017  . Dehydration 06/07/2017  . Sepsis, unspecified organism ([place]) 12/03/2016  . 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 Normocytic anemia 05/02/2015  . Hypotension   . 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 FAP (familial adenomatous polyposis) 09/30/2014  . Nephrolithiasis 09/30/2014  . Adenocarcinoma of colon ([place]) 05/26/2013  . Nausea with vomiting 05/21/2013  . Microcytic anemia 04/25/2013  . Hypokalemia 04/25/2013    Past Surgical History:  Procedure Laterality Date  . COLONOSCOPY N/A 05/24/2013   Procedure: COLONOSCOPY;  Surgeon: Missy Sabins, MD;  Location: [place];  Service: Endoscopy;  Laterality: N/A;  . COMPLETION PROTECTOMY AND REVISION ILEOSTOMY/ LYSIS ADHESIONS  07-28-2014    [facility]  . CYSTOSCOPY W/ RETROGRADES Right 12/15/2016   Procedure: CYSTOSCOPY WITH RETROGRADE PYELOGRAM;  Surgeon: Alexis Frock, MD;  Location: [facility];  Service: Urology;  Laterality: Right;  . CYSTOSCOPY W/ URETERAL STENT PLACEMENT Bilateral 11/01/2016   Procedure: CYSTOSCOPY WITH BILATERAL RETROGRADE PYELOGRAM BILATERAL URETERAL STENT PLACEMENT;  Surgeon: Alexis Frock, MD;  [facility];  Service: Urology;  Laterality: Bilateral;  . CYSTOSCOPY W/ URETERAL STENT PLACEMENT Right 12/15/2016   Procedure: CYSTOSCOPY WITH STENT REPLACEMENT;  Surgeon: Alexis Frock, MD;  Location: [facility];  Service: Urology;  Laterality: Right;  . CYSTOSCOPY W/ URETERAL STENT REMOVAL Bilateral 12/15/2016   Procedure: CYSTOSCOPY WITH STENT REMOVAL;  Surgeon: Alexis Frock, MD;  Location: [facility];  Service: Urology;  Laterality: Bilateral;  . CYSTOSCOPY WITH URETEROSCOPY Bilateral 12/01/2016   Procedure: FIRST STAGE CYSTOSCOPY WITH URETEROSCOPY,  STONE MANIPULATION and BASKETRY, STENT EXCHANGE;  Surgeon: Alexis Frock, MD;  [facility];  Service: Urology;  Laterality: Bilateral;  . CYSTOSCOPY WITH URETEROSCOPY Right 12/15/2016   Procedure: SECOND STAGE -CYSTOSCOPY WITH URETEROSCOPY AND STONE EXTRACTION WITH BASKET;  Surgeon: Alexis Frock, MD;  Location: [facility];  Service: Urology;  Laterality: Right;  . HOLMIUM LASER APPLICATION Bilateral 0/05/6225   Procedure: HOLMIUM LASER APPLICATION;  Surgeon: Alexis Frock, MD;  Location: [facility];  Service: Urology;  Laterality: Bilateral;  . HOLMIUM LASER APPLICATION Right 3/33/5456   Procedure: HOLMIUM LASER APPLICATION;  Surgeon: Alexis Frock, MD;  Location: [facility];  Service: Urology;  Laterality: Right;  . I&D EXTREMITY Left 11/14/2013   Procedure: IRRIGATION AND DEBRIDEMENT LEFT LONG FINGER;  Surgeon: Tennis Must, MD;  Location: [place];  Service: Orthopedics;  Laterality: Left;  . IM NAILING TIBIA Left 08/23/2010  . LAPAROSCOPIC APPENDECTOMY  12/23/2007  . LEG RECONSTRUCTION USING FASCIAL FLAP  ~ 2009  . SUBTOTAL COLECTOMY /  RESECTION ENBLOC DISTAL ILEUM/  ILEOSTOMY  07/ 2014  [facility]  . TRANSTHORACIC ECHOCARDIOGRAM  11/11/2011   ef 55-60%/  mild MR/  trivial PR and TR/  PASP 29-11mmHg/  trivial pericardial effusion identified    OB History    Gravida  1   Para  1   Term  0   Preterm  1   AB  0   Living        SAB  0   TAB  0   Ectopic  0   Multiple      Live Births               Home Medications    Prior to Admission medications   Medication Sig Start Date End Date Taking? Authorizing Provider  cetirizine (ZYRTEC) 10 MG tablet Take 1 tablet (10 mg total) by mouth daily. 07/09/19   Dorla Guizar, Tressia Miners A, NP  cyanocobalamin (,VITAMIN B-12,) 1000 MCG/ML injection Inject 1,000 mcg into the muscle every 30 (thirty) days.    [provider]  loperamide (IMODIUM) 1 MG/5ML solution Take 3 mg by mouth 3 (three) times daily as needed  for diarrhea or loose stools.    [provider]  LORazepam (ATIVAN) 1 MG tablet Take 1 mg by mouth 3 (three) times daily as needed for anxiety.    [provider]  magnesium oxide (MAG-OX) 400 MG tablet Take 400 mg by mouth at bedtime.    [provider]  Multiple Vitamins-Minerals (MULTIVITAMIN GUMMIES ADULT) CHEW Chew 2 each by mouth daily.     [provider]  potassium chloride 20 MEQ/15ML (10%) SOLN Take 20 mLs (26.6667 mEq total) by mouth 3 (three) times daily. 09/04/17   Bonnielee Haff, MD  Probiotic Product (PROBIOTIC DAILY PO) Take 1 capsule by mouth daily.    [provider]  Vitamin D, Ergocalciferol, (DRISDOL) 50000 units CAPS capsule Take 50,000 Units by mouth every Monday.  05/22/16   [provider]    Family History Family History  Adopted: Yes  Problem  Relation Age of Onset  . ALS Mother   . Healthy Father   . Alcohol abuse Neg Hx   . Arthritis Neg Hx   . Asthma Neg Hx   . Birth defects Neg Hx   . Cancer Neg Hx   . COPD Neg Hx   . Depression Neg Hx   . Diabetes Neg Hx   . Drug abuse Neg Hx   . Early death Neg Hx   . Hearing loss Neg Hx   . Heart disease Neg Hx   . Hyperlipidemia Neg Hx   . Hypertension Neg Hx   . Kidney disease Neg Hx   . Learning disabilities Neg Hx   . Mental illness Neg Hx   . Mental retardation Neg Hx   . Miscarriages / Stillbirths Neg Hx   . Stroke Neg Hx   . Vision loss Neg Hx     Social History Social History   Tobacco Use  . Smoking status: Never Smoker  . Smokeless tobacco: Never Used  Substance Use Topics  . Alcohol use: No  . Drug use: No     Allergies   Iron, Venofer [ferric oxide], Soap, Food, Morphine and related, Sulfa antibiotics, Sulfamethoxazole-trimethoprim, and Levaquin [levofloxacin]   Review of Systems Review of Systems   Physical Exam Triage Vital Signs ED Triage Vitals  Enc Vitals Group     BP 07/09/19 0959 113/73     Pulse Rate 07/09/19 0959  99     Resp 07/09/19 0959 17     Temp 07/09/19 0959 98.4 F (36.9 C)     Temp Source 07/09/19 0959 Oral     SpO2 07/09/19 0959 100 %     Weight --      Height --      Head Circumference --      Peak Flow --      Pain Score 07/09/19 0956 8     Pain Loc --      Pain Edu? --      Excl. in [place]? --    No data found.  Updated Vital Signs BP 113/73 (BP Location: Right Arm)   Pulse 99   Temp 98.4 F (36.9 C) (Oral)   Resp 17   SpO2 100%   Visual Acuity Right Eye Distance:   Left Eye Distance:   Bilateral Distance:    Right Eye Near:   Left Eye Near:    Bilateral Near:     Physical Exam Vitals signs and nursing note reviewed.  Constitutional:      General: She is not in acute distress.    Appearance: She is well-developed. She is not ill-appearing, toxic-appearing or diaphoretic.  HENT:     Head: Normocephalic and atraumatic.     Right Ear: Tympanic membrane and ear canal normal.     Left Ear: Tympanic membrane and ear canal normal.     Mouth/Throat:     Pharynx: Oropharynx is clear. Uvula midline.     Tonsils: No tonsillar exudate. 0 on the right. 0 on the left.  Eyes:     Conjunctiva/sclera: Conjunctivae normal.  Neck:     Musculoskeletal: Normal range of motion.  Cardiovascular:     Rate and Rhythm: Normal rate and regular rhythm.  Pulmonary:     Effort: Pulmonary effort is normal.     Breath sounds: Normal breath sounds.  Lymphadenopathy:     Cervical: No cervical adenopathy.  Skin:    General: Skin is warm and dry.  Neurological:     Mental Status: She is alert.  Psychiatric:        Mood and Affect: Mood normal.      UC Treatments / Results  Labs (all labs ordered are listed, but only abnormal results are displayed) Labs Reviewed  NOVEL CORONAVIRUS, NAA (HOSPITAL ORDER, SEND-OUT TO REF LAB)  CULTURE, GROUP A STREP [facility])  POCT RAPID STREP A    EKG   Radiology No results found.  Procedures Procedures (including critical care time)   Medications Ordered in UC Medications - No data to display  Initial Impression / Assessment and Plan / UC Course  I have reviewed the triage vital signs and the nursing notes.  Pertinent labs & imaging results that were available during my care of the patient were reviewed by me and considered in my medical decision making (see chart for details).     Rapid strep test is negative Most likely allergy related COVID testing done based on symptoms.  Labs pending and precautions given Work note given  Final Clinical Impressions(s) / UC Diagnoses   Final diagnoses:  Allergic state, initial encounter     Discharge Instructions     Your rapid strep test was negative.  We will send for culture. COVID swab sent for testing.  We should have those results in a couple days. I believe your symptoms are most likely allergy related.  Zyrtec sent to the pharmacy for you to take daily. Work note given  Follow up as needed for continued or worsening symptoms     ED Prescriptions    Medication Sig Dispense Auth. Provider   cetirizine (ZYRTEC) 10 MG tablet Take 1 tablet (10 mg total) by mouth daily. 30 tablet Loura Halt A, NP     Controlled Substance Prescriptions [place] Controlled Substance Registry consulted? Not Applicable   Orvan July, NP 07/09/19 1034

## 2019-07-11 ENCOUNTER — Encounter (HOSPITAL_COMMUNITY): Payer: Self-pay

## 2019-07-11 LAB — CULTURE, GROUP A STREP (THRC)

## 2019-07-11 LAB — NOVEL CORONAVIRUS, NAA (HOSP ORDER, SEND-OUT TO REF LAB; TAT 18-24 HRS): SARS-CoV-2, NAA: NOT DETECTED

## 2019-07-20 ENCOUNTER — Emergency Department (HOSPITAL_COMMUNITY): Payer: PRIVATE HEALTH INSURANCE

## 2019-07-20 ENCOUNTER — Emergency Department (HOSPITAL_COMMUNITY)
Admission: EM | Admit: 2019-07-20 | Discharge: 2019-07-20 | Disposition: A | Discharge status: Home / Self Care | Payer: PRIVATE HEALTH INSURANCE | Attending: Emergency Medicine | Admitting: Emergency Medicine

## 2019-07-20 ENCOUNTER — Other Ambulatory Visit: Payer: Self-pay

## 2019-07-20 ENCOUNTER — Encounter (HOSPITAL_COMMUNITY): Payer: Self-pay | Admitting: *Deleted

## 2019-07-20 DIAGNOSIS — Y939 Activity, unspecified: Secondary | ICD-10-CM | POA: Insufficient documentation

## 2019-07-20 DIAGNOSIS — N182 Chronic kidney disease, stage 2 (mild): Secondary | ICD-10-CM | POA: Diagnosis not present

## 2019-07-20 DIAGNOSIS — S6991XA Unspecified injury of right wrist, hand and finger(s), initial encounter: Secondary | ICD-10-CM | POA: Diagnosis not present

## 2019-07-20 DIAGNOSIS — W19XXXA Unspecified fall, initial encounter: Secondary | ICD-10-CM

## 2019-07-20 DIAGNOSIS — W52XXXA Crushed, pushed or stepped on by crowd or human stampede, initial encounter: Secondary | ICD-10-CM | POA: Diagnosis not present

## 2019-07-20 DIAGNOSIS — Z85038 Personal history of other malignant neoplasm of large intestine: Secondary | ICD-10-CM | POA: Diagnosis not present

## 2019-07-20 DIAGNOSIS — Y999 Unspecified external cause status: Secondary | ICD-10-CM | POA: Diagnosis not present

## 2019-07-20 DIAGNOSIS — Y92513 Shop (commercial) as the place of occurrence of the external cause: Secondary | ICD-10-CM | POA: Insufficient documentation

## 2019-07-20 MED ORDER — DICLOFENAC SODIUM 1 % TD GEL: 2.0000 g | Freq: Four times a day (QID) | TRANSDERMAL | 0 refills | Status: DC

## 2019-07-20 MED ORDER — OXYCODONE-ACETAMINOPHEN 5-325 MG PO TABS
1.0000 | ORAL_TABLET | Freq: Once | ORAL | Status: AC
  Administered 2019-07-20: 08:00:00 1 via ORAL
  Filled 2019-07-20: qty 1

## 2019-07-20 NOTE — Discharge Instructions (Addendum)
Please read and follow all provided instructions. An x-ray of the affected area - does NOT show any broken bones or dislocations- however given the area of tenderness that you have on exam we are concerned there could be what is called an occult fracture that is difficult to see on x-ray- we have placed you in a brace today- please wear this at all times until you have followed up with orthopedics- please do so within 1 week.   Home care instructions: -- *PRICE in the first 24-48 hours after injury: Protect (with brace, splint, sling), if given by your provider Rest Ice- Do not apply ice pack directly to your skin, place towel or similar between your skin and ice/ice pack. Apply ice for 20 min, then remove for 40 min while awake Compression- Wear brace, elastic bandage, splint as directed by your provider Elevate affected extremity above the level of your heart when not walking around for the first 24-48 hours   Medications:  Diclofenac gel: apply up to 4 times per day as needed for pain. Apply directly to area of discomfort. Please limit to 5 days of you.   Follow-up instructions: Please follow-up with Dr. Luanna Cole (orthopedic surgeon) office within 1 week.   Return instructions:  Please return if your digits or extremity are numb or tingling, appear gray or blue, or you have severe pain (also elevate the extremity and loosen splint or wrap if you were given one) Please return if you have redness or fevers.  Please return to the Emergency Department if you experience worsening symptoms.  Please return if you have any other emergent concerns. Additional Information:  Your vital signs today were: BP 103/64 (BP Location: Right Arm)    Pulse (!) 105    Temp 98.2 F (36.8 C) (Oral)    Resp 17    Ht 5\' 1"  (1.549 m)    Wt 47.6 kg    LMP 07/20/2019    SpO2 100%    BMI 19.84 kg/m  If your blood pressure (BP) was elevated above 135/85 this visit, please have this repeated by your doctor within one  month. ---------------

## 2019-07-20 NOTE — ED Triage Notes (Signed)
Pt present with rt hand lower arm injury after a fall last night,
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
## 2019-07-20 NOTE — ED Notes (Signed)
Patient given ice pack

## 2019-07-23 ENCOUNTER — Emergency Department (HOSPITAL_COMMUNITY)
Admission: EM | Admit: 2019-07-23 | Discharge: 2019-07-23 | Disposition: A | Discharge status: Home / Self Care | Payer: PRIVATE HEALTH INSURANCE | Attending: Emergency Medicine | Admitting: Emergency Medicine

## 2019-07-23 ENCOUNTER — Encounter (HOSPITAL_COMMUNITY): Payer: Self-pay

## 2019-07-23 ENCOUNTER — Other Ambulatory Visit: Payer: Self-pay

## 2019-07-23 ENCOUNTER — Emergency Department (HOSPITAL_COMMUNITY): Payer: PRIVATE HEALTH INSURANCE

## 2019-07-23 DIAGNOSIS — W010XXA Fall on same level from slipping, tripping and stumbling without subsequent striking against object, initial encounter: Secondary | ICD-10-CM | POA: Insufficient documentation

## 2019-07-23 DIAGNOSIS — Z79899 Other long term (current) drug therapy: Secondary | ICD-10-CM | POA: Diagnosis not present

## 2019-07-23 DIAGNOSIS — N182 Chronic kidney disease, stage 2 (mild): Secondary | ICD-10-CM | POA: Diagnosis not present

## 2019-07-23 DIAGNOSIS — Y999 Unspecified external cause status: Secondary | ICD-10-CM | POA: Insufficient documentation

## 2019-07-23 DIAGNOSIS — I129 Hypertensive chronic kidney disease with stage 1 through stage 4 chronic kidney disease, or unspecified chronic kidney disease: Secondary | ICD-10-CM | POA: Diagnosis not present

## 2019-07-23 DIAGNOSIS — Y939 Activity, unspecified: Secondary | ICD-10-CM | POA: Diagnosis not present

## 2019-07-23 DIAGNOSIS — Y929 Unspecified place or not applicable: Secondary | ICD-10-CM | POA: Diagnosis not present

## 2019-07-23 DIAGNOSIS — S6991XA Unspecified injury of right wrist, hand and finger(s), initial encounter: Secondary | ICD-10-CM | POA: Diagnosis present

## 2019-07-23 DIAGNOSIS — S63501A Unspecified sprain of right wrist, initial encounter: Secondary | ICD-10-CM

## 2019-07-23 MED ORDER — HYDROCODONE-ACETAMINOPHEN 5-325 MG PO TABS
1.0000 | ORAL_TABLET | Freq: Once | ORAL | Status: AC
  Administered 2019-07-23: 1 via ORAL
  Filled 2019-07-23: qty 1

## 2019-07-23 MED ORDER — HYDROCODONE-ACETAMINOPHEN 5-325 MG PO TABS: 1.0000 | ORAL_TABLET | Freq: Two times a day (BID) | ORAL | 0 refills | Status: DC

## 2019-07-23 NOTE — ED Triage Notes (Signed)
Seen here for right wrist pain and discharge and seen at clinical today for same came back here due to pain with Velcro wrist splint in place

## 2019-07-23 NOTE — ED Provider Notes (Addendum)
[place] DEPT Provider Note   CSN: [id number] Arrival date & time: 07/23/19  1512     History   Chief Complaint Chief Complaint  Patient presents with   Wrist Pain    HPI Cindy [name] is a 28 y.o. female.     HPI   Patient is a 28 year old female with a history of CKD, colon cancer, who presents to the emergency department today complaining of right wrist pain.  Patient states that she fell several days ago onto her outstretched hand and hurt her right wrist.  Since then she has had severe pain.  She was placed in a wrist splint a few days ago and told to follow-up with orthopedics as she may have an occult scaphoid fracture.  She was given diclofenac as she has a history of CKD.  States she has been using this medication but it is not help with the pain which is why she decided to come to the ED today.  She has had no new injuries.  She has no other complaints.   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 appy 12-23-2007 (emergent ruptured appendix)   FAP (familial adenomatous polyposis) 09/30/2014   Heart murmur    History of acute renal failure    2015;  2016   History of fetal demise, not currently pregnant    03-22-2013  stillborn at 53 wks   History of kidney stones    History of sepsis    admitted 12-03-2016 (post-op day 2 w/ bilateral ureteral stents and stone manipluation) discharged 12-06-2016  for urosepsis   Hypomagnesemia    Ileostomy in place [facility])    Iron deficiency anemia    Nephrolithiasis    BILATERAL    Normocytic anemia    Renal cyst, left    PER CT 11-12-2016   Urgency of urination     Patient Active Problem List   Diagnosis Date Noted   Lower abdominal pain 07/13/2018   Elevated lipase 07/13/2018   AKI (acute kidney injury) ([place]) 09/02/2017   Dehydration 06/07/2017   Sepsis, unspecified organism ([place]) 12/03/2016   CKD (chronic kidney disease), stage II 12/03/2016   Normocytic anemia 05/02/2015   Hypotension    High output ileostomy ([place]) 09/30/2014   FAP (familial adenomatous polyposis) 09/30/2014   Nephrolithiasis 09/30/2014   Adenocarcinoma of colon ([place]) 05/26/2013   Nausea with vomiting 05/21/2013   Microcytic anemia 04/25/2013   Hypokalemia 04/25/2013    Past Surgical History:  Procedure Laterality Date   COLONOSCOPY N/A 05/24/2013   Procedure: COLONOSCOPY;  Surgeon: Missy Sabins, MD;  Location: [place];  Service: Endoscopy;  Laterality: N/A;   COMPLETION PROTECTOMY AND REVISION ILEOSTOMY/ LYSIS ADHESIONS  07-28-2014    [facility]   CYSTOSCOPY W/ RETROGRADES Right 12/15/2016   Procedure: CYSTOSCOPY WITH RETROGRADE PYELOGRAM;  Surgeon: Alexis Frock, MD;  Location: [facility];  Service: Urology;  Laterality: Right;   CYSTOSCOPY W/ URETERAL STENT PLACEMENT Bilateral 11/01/2016   Procedure: CYSTOSCOPY WITH BILATERAL RETROGRADE PYELOGRAM BILATERAL URETERAL  STENT PLACEMENT;  Surgeon: Alexis Frock, MD;  Location: [facility];  Service: Urology;  Laterality: Bilateral;   CYSTOSCOPY W/ URETERAL STENT PLACEMENT Right 12/15/2016   Procedure: CYSTOSCOPY WITH STENT REPLACEMENT;  Surgeon: Alexis Frock, MD;  Location: [facility];  Service: Urology;  Laterality: Right;   CYSTOSCOPY W/ URETERAL STENT REMOVAL Bilateral 12/15/2016   Procedure: CYSTOSCOPY WITH STENT REMOVAL;  Surgeon: Alexis Frock, MD;  Location: [facility];  Service: Urology;  Laterality: Bilateral;   CYSTOSCOPY WITH URETEROSCOPY Bilateral  12/01/2016   Procedure: FIRST STAGE CYSTOSCOPY WITH URETEROSCOPY,  STONE MANIPULATION and BASKETRY, STENT EXCHANGE;  Surgeon: Alexis Frock, MD;  Location: [facility];  Service: Urology;  Laterality: Bilateral;   CYSTOSCOPY WITH URETEROSCOPY Right 12/15/2016   Procedure: SECOND STAGE -CYSTOSCOPY WITH URETEROSCOPY AND STONE EXTRACTION WITH BASKET;  Surgeon: Alexis Frock, MD;  Location: [facility];  Service: Urology;  Laterality: Right;   HOLMIUM LASER APPLICATION Bilateral 04/04/5637   Procedure: HOLMIUM LASER APPLICATION;  Surgeon: Alexis Frock, MD;  Location: [facility];  Service: Urology;  Laterality: Bilateral;   HOLMIUM LASER APPLICATION Right 7/56/4332   Procedure: HOLMIUM LASER APPLICATION;  Surgeon: Alexis Frock, MD;  Location: [facility];  Service: Urology;  Laterality: Right;   I&D EXTREMITY Left 11/14/2013   Procedure: IRRIGATION AND DEBRIDEMENT LEFT LONG FINGER;  Surgeon: Tennis Must, MD;  Location: [place];  Service: Orthopedics;  Laterality: Left;   IM NAILING TIBIA Left 08/23/2010   LAPAROSCOPIC APPENDECTOMY  12/23/2007   LEG RECONSTRUCTION USING FASCIAL FLAP  ~ 2009   SUBTOTAL COLECTOMY /  RESECTION ENBLOC DISTAL ILEUM/  ILEOSTOMY  07/ 2014  [facility]   TRANSTHORACIC ECHOCARDIOGRAM  11/11/2011   ef 55-60%/  mild MR/  trivial PR and TR/  PASP 29-20mmHg/  trivial pericardial effusion identified     OB History    Gravida  1   Para  1   Term  0   Preterm  1   AB  0   Living        SAB  0   TAB  0   Ectopic  0   Multiple      Live Births               Home Medications    Prior to Admission medications   Medication Sig Start Date End Date Taking? Authorizing Provider  cetirizine (ZYRTEC) 10 MG tablet Take 1 tablet (10 mg total) by mouth daily. 07/09/19   Bast, Tressia Miners A, NP  cyanocobalamin (,VITAMIN B-12,) 1000 MCG/ML injection Inject 1,000 mcg into the muscle every 30  (thirty) days.    [provider]  diclofenac sodium (VOLTAREN) 1 % GEL Apply 2 g topically 4 (four) times daily. 07/20/19   Petrucelli, Glynda Jaeger, PA-C  HYDROcodone-acetaminophen (NORCO/VICODIN) 5-325 MG tablet Take 1 tablet by mouth 2 times daily at 12 noon and 4 pm. 07/23/19   Jovanni Eckhart S, PA-C  loperamide (IMODIUM) 1 MG/5ML solution Take 3 mg by mouth 3 (three) times daily as needed for diarrhea or loose stools.    [provider]  LORazepam (ATIVAN) 1 MG tablet Take 1 mg by mouth 3 (three) times daily as needed for anxiety.    [provider]  magnesium oxide (MAG-OX) 400 MG tablet Take 400 mg by mouth at bedtime.    [provider]  Multiple Vitamins-Minerals (MULTIVITAMIN GUMMIES ADULT) CHEW Chew 2 each by mouth daily.  [provider]  potassium chloride 20 MEQ/15ML (10%) SOLN Take 20 mLs (26.6667 mEq total) by mouth 3 (three) times daily. Patient not taking: Reported on 07/20/2019 09/04/17   Bonnielee Haff, MD  Probiotic Product (PROBIOTIC DAILY PO) Take 1 capsule by mouth daily.    [provider]  Vitamin D, Ergocalciferol, (DRISDOL) 50000 units CAPS capsule Take 50,000 Units by mouth every Monday.  05/22/16   [provider]    Family History Family History  Adopted: Yes  Problem Relation Age of Onset   ALS Mother    Healthy Father    Alcohol abuse Neg Hx    Arthritis Neg Hx    Asthma Neg Hx    Birth defects Neg Hx    Cancer Neg Hx    COPD Neg Hx    Depression Neg Hx    Diabetes Neg Hx    Drug abuse Neg Hx    Early death Neg Hx    Hearing loss Neg Hx    Heart disease Neg Hx    Hyperlipidemia Neg Hx    Hypertension Neg Hx    Kidney disease Neg Hx    Learning disabilities Neg Hx    Mental illness Neg Hx    Mental retardation Neg Hx    Miscarriages / Stillbirths Neg Hx    Stroke Neg Hx    Vision loss Neg Hx     Social History Social History   Tobacco Use   Smoking  status: Never Smoker   Smokeless tobacco: Never Used  Substance Use Topics   Alcohol use: No   Drug use: No     Allergies   Iron, Venofer [ferric oxide], Soap, Food, Morphine and related, Sulfa antibiotics, Sulfamethoxazole-trimethoprim, and Levaquin [levofloxacin]   Review of Systems Review of Systems  Constitutional: Negative for fever.  Musculoskeletal:       Right wrist pain     Physical Exam Updated Vital Signs BP (!) 87/60    Pulse 95    Temp 98.2 F (36.8 C) (Oral)    Resp 14    Ht 5\' 1"  (1.549 m)    Wt 47.6 kg    LMP 07/20/2019    SpO2 98%    BMI 19.83 kg/m   Physical Exam Vitals signs and nursing note reviewed.  Constitutional:      General: She is not in acute distress.    Appearance: She is well-developed.  HENT:     Head: Normocephalic.  Eyes:     Conjunctiva/sclera: Conjunctivae normal.  Neck:     Musculoskeletal: Neck supple.  Cardiovascular:     Rate and Rhythm: Normal rate.  Pulmonary:     Effort: Pulmonary effort is normal.  Musculoskeletal:     Comments: TTP to the radius/ulna and along the anatomical snuff box. Also with ttp to the right thumb. NVI distally.   Skin:    General: Skin is warm and dry.  Neurological:     Mental Status: She is alert.      ED Treatments / Results  Labs (all labs ordered are listed, but only abnormal results are displayed) Labs Reviewed - No data to display  EKG None  Radiology Dg Wrist Complete Right  Result Date: 07/23/2019 CLINICAL DATA:  Post fall on 07/20/2019 with persistent wrist pain. EXAM: RIGHT WRIST - COMPLETE 3+ VIEW COMPARISON:  Right hand radiographs-07/20/2019 FINDINGS: No fracture or dislocation. Joint spaces are preserved. No erosions. No evidence of chondrocalcinosis. Regional soft tissues appear normal. No radiopaque  foreign body. IMPRESSION: No fracture. If the patient has pain referable to the anatomic snuff box, splinting and a follow-up radiograph in 10 to 14 days is recommended to  evaluate for occult scaphoid fracture. Electronically Signed   By: Sandi Mariscal M.D.   On: 07/23/2019 18:18    Procedures Procedures (including critical care time)  Medications Ordered in ED Medications  HYDROcodone-acetaminophen (NORCO/VICODIN) 5-325 MG per tablet 1 tablet (1 tablet Oral Given 07/23/19 1759)     Initial Impression / Assessment and Plan / ED Course  I have reviewed the triage vital signs and the nursing notes.  Pertinent labs & imaging results that were available during my care of the patient were reviewed by me and considered in my medical decision making (see chart for details).   Final Clinical Impressions(s) / ED Diagnoses   Final diagnoses:  Sprain of right wrist, initial encounter   Patient complaining of wrist pain after fall 3 days ago. TTP to the radius/ulna and along the anatomical snuff box. Also with ttp to the right thumb.   Reviewed prior imaging which was negative.  Will obtain right wrist x-ray.  X-ray did not show any evidence of fracture.  Gave dose of pain medications.  Will give Rx for pain medicine for home and have her follow-up.  She states she has an appointment with orthopedics tomorrow.  Advised that if she has persistent pain she may need repeat x-ray in 10 to 14 days to rule out occult scaphoid fracture.  She voices understanding of this and is agreement with plan.  All questions answered.  Patient stable for discharge.  Pt hypotensive in the Ed. She states she has a h/o same. She is asymptomatic and had not had any bleeding, etc. Reviewed records, pt does have h/o similar BP's. Advised to return if she becomes symptomatic.   ED Discharge Orders         Ordered    HYDROcodone-acetaminophen (NORCO/VICODIN) 5-325 MG tablet  2 times daily     07/23/19 [address] PA-C 07/23/19 1833    Rodney Booze, PA-C 07/23/19 1920    Drenda Freeze, MD 07/24/19 [phone]

## 2019-07-23 NOTE — Discharge Instructions (Addendum)
Prescription given for Norco. Take medication as directed and do not operate machinery, drive a car, or work while taking this medication as it can make you drowsy.   Please keep your appointment with the orthopedic doctor tomorrow.  You may need a repeat x-ray in 10 to 14 days to rule out an occult fracture.  Return to the emergency department for any new or worsening symptoms.

## 2019-08-20 ENCOUNTER — Encounter (HOSPITAL_COMMUNITY): Payer: Self-pay | Admitting: Emergency Medicine

## 2019-08-20 ENCOUNTER — Emergency Department (HOSPITAL_COMMUNITY)
Admission: EM | Admit: 2019-08-20 | Discharge: 2019-08-20 | Disposition: A | Discharge status: Home / Self Care | Payer: PRIVATE HEALTH INSURANCE | Attending: Emergency Medicine | Admitting: Emergency Medicine

## 2019-08-20 ENCOUNTER — Other Ambulatory Visit: Payer: Self-pay

## 2019-08-20 DIAGNOSIS — Z5321 Procedure and treatment not carried out due to patient leaving prior to being seen by health care provider: Secondary | ICD-10-CM | POA: Insufficient documentation

## 2019-08-20 DIAGNOSIS — K0889 Other specified disorders of teeth and supporting structures: Secondary | ICD-10-CM | POA: Diagnosis present

## 2019-08-20 NOTE — ED Triage Notes (Signed)
C/o left side dental pain for couple days.

## 2019-08-20 NOTE — ED Notes (Signed)
Called for pt x 3, not in waiting room

## 2019-08-20 NOTE — ED Notes (Signed)
Called for pt x 2

## 2019-09-03 ENCOUNTER — Encounter: Payer: Self-pay | Admitting: Genetic Counselor

## 2019-09-24 ENCOUNTER — Emergency Department (HOSPITAL_COMMUNITY): Payer: PRIVATE HEALTH INSURANCE

## 2019-09-24 ENCOUNTER — Encounter (HOSPITAL_COMMUNITY): Payer: Self-pay | Admitting: Emergency Medicine

## 2019-09-24 ENCOUNTER — Other Ambulatory Visit: Payer: Self-pay

## 2019-09-24 ENCOUNTER — Emergency Department (HOSPITAL_COMMUNITY)
Admission: EM | Admit: 2019-09-24 | Discharge: 2019-09-24 | Disposition: A | Discharge status: Home / Self Care | Payer: PRIVATE HEALTH INSURANCE | Attending: Emergency Medicine | Admitting: Emergency Medicine

## 2019-09-24 DIAGNOSIS — N179 Acute kidney failure, unspecified: Secondary | ICD-10-CM | POA: Diagnosis not present

## 2019-09-24 DIAGNOSIS — N182 Chronic kidney disease, stage 2 (mild): Secondary | ICD-10-CM | POA: Diagnosis not present

## 2019-09-24 DIAGNOSIS — Z79899 Other long term (current) drug therapy: Secondary | ICD-10-CM | POA: Diagnosis not present

## 2019-09-24 DIAGNOSIS — E876 Hypokalemia: Secondary | ICD-10-CM | POA: Insufficient documentation

## 2019-09-24 DIAGNOSIS — R1084 Generalized abdominal pain: Secondary | ICD-10-CM | POA: Diagnosis present

## 2019-09-24 LAB — MAGNESIUM: Magnesium: 2.2 mg/dL (ref 1.7–2.4)

## 2019-09-24 LAB — CBC
HCT: 33.7 % — ABNORMAL LOW (ref 36.0–46.0)
Hemoglobin: 10.6 g/dL — ABNORMAL LOW (ref 12.0–15.0)
MCH: 22.2 pg — ABNORMAL LOW (ref 26.0–34.0)
MCHC: 31.5 g/dL (ref 30.0–36.0)
MCV: 70.5 fL — ABNORMAL LOW (ref 80.0–100.0)
Platelets: 494 10*3/uL — ABNORMAL HIGH (ref 150–400)
RBC: 4.78 MIL/uL (ref 3.87–5.11)
RDW: 16.1 % — ABNORMAL HIGH (ref 11.5–15.5)
WBC: 12.3 10*3/uL — ABNORMAL HIGH (ref 4.0–10.5)
nRBC: 0 % (ref 0.0–0.2)

## 2019-09-24 LAB — URINALYSIS, ROUTINE W REFLEX MICROSCOPIC
Bilirubin Urine: NEGATIVE
Glucose, UA: NEGATIVE mg/dL
Hgb urine dipstick: NEGATIVE
Ketones, ur: NEGATIVE mg/dL
Nitrite: NEGATIVE
Protein, ur: 30 mg/dL — AB
Specific Gravity, Urine: 1.012 (ref 1.005–1.030)
pH: 5 (ref 5.0–8.0)

## 2019-09-24 LAB — COMPREHENSIVE METABOLIC PANEL
ALT: 12 U/L (ref 0–44)
AST: 16 U/L (ref 15–41)
Albumin: 4.7 g/dL (ref 3.5–5.0)
Alkaline Phosphatase: 96 U/L (ref 38–126)
Anion gap: 15 (ref 5–15)
BUN: 24 mg/dL — ABNORMAL HIGH (ref 6–20)
CO2: 16 mmol/L — ABNORMAL LOW (ref 22–32)
Calcium: 9.7 mg/dL (ref 8.9–10.3)
Chloride: 98 mmol/L (ref 98–111)
Creatinine, Ser: 2.3 mg/dL — ABNORMAL HIGH (ref 0.44–1.00)
GFR calc Af Amer: 32 mL/min — ABNORMAL LOW (ref >60)
GFR calc non Af Amer: 28 mL/min — ABNORMAL LOW (ref >60)
Glucose, Bld: 113 mg/dL — ABNORMAL HIGH (ref 70–99)
Potassium: 2.3 mmol/L — CL (ref 3.5–5.1)
Sodium: 129 mmol/L — ABNORMAL LOW (ref 135–145)
Total Bilirubin: 0.6 mg/dL (ref 0.3–1.2)
Total Protein: 9.3 g/dL — ABNORMAL HIGH (ref 6.5–8.1)

## 2019-09-24 LAB — LIPASE, BLOOD: Lipase: 65 U/L — ABNORMAL HIGH (ref 11–51)

## 2019-09-24 LAB — I-STAT BETA HCG BLOOD, ED (MC, WL, AP ONLY): I-stat hCG, quantitative: <5 m[IU]/mL (ref <5)

## 2019-09-24 MED ORDER — SODIUM CHLORIDE 0.9 % IV BOLUS
1000.0000 mL | Freq: Once | INTRAVENOUS | Status: AC
  Administered 2019-09-24: 1000 mL via INTRAVENOUS

## 2019-09-24 MED ORDER — CEPHALEXIN 500 MG PO CAPS: 500.0000 mg | ORAL_CAPSULE | Freq: Two times a day (BID) | ORAL | 0 refills | Status: AC

## 2019-09-24 MED ORDER — FENTANYL CITRATE (PF) 100 MCG/2ML IJ SOLN
100.0000 ug | Freq: Once | INTRAMUSCULAR | Status: AC
  Administered 2019-09-24: 07:00:00 100 ug via INTRAVENOUS
  Filled 2019-09-24: qty 2

## 2019-09-24 MED ORDER — ONDANSETRON HCL 4 MG/2ML IJ SOLN
4.0000 mg | Freq: Once | INTRAMUSCULAR | Status: AC
  Administered 2019-09-24: 05:00:00 4 mg via INTRAVENOUS
  Filled 2019-09-24: qty 2

## 2019-09-24 MED ORDER — METOCLOPRAMIDE HCL 5 MG/ML IJ SOLN
10.0000 mg | Freq: Once | INTRAMUSCULAR | Status: AC
  Administered 2019-09-24: 07:00:00 10 mg via INTRAVENOUS
  Filled 2019-09-24: qty 2

## 2019-09-24 MED ORDER — PROCHLORPERAZINE EDISYLATE 10 MG/2ML IJ SOLN
10.0000 mg | Freq: Once | INTRAMUSCULAR | Status: AC
  Administered 2019-09-24: 10:00:00 10 mg via INTRAVENOUS
  Filled 2019-09-24: qty 2

## 2019-09-24 MED ORDER — POTASSIUM CHLORIDE 10 MEQ/100ML IV SOLN
10.0000 meq | Freq: Once | INTRAVENOUS | Status: AC
  Administered 2019-09-24: 06:00:00 10 meq via INTRAVENOUS
  Filled 2019-09-24: qty 100

## 2019-09-24 MED ORDER — SODIUM CHLORIDE 0.9 % IV BOLUS: 1000.0000 mL | Freq: Once | INTRAVENOUS | Status: DC

## 2019-09-24 MED ORDER — SODIUM CHLORIDE 0.9% FLUSH
3.0000 mL | Freq: Once | INTRAVENOUS | Status: AC
  Administered 2019-09-24: 05:00:00 3 mL via INTRAVENOUS

## 2019-09-24 MED ORDER — IOHEXOL 300 MG/ML  SOLN
30.0000 mL | Freq: Once | INTRAMUSCULAR | Status: AC | PRN
  Administered 2019-09-24: 30 mL via ORAL

## 2019-09-24 MED ORDER — POTASSIUM CHLORIDE 20 MEQ/15ML (10%) PO SOLN
40.0000 meq | Freq: Once | ORAL | Status: AC
  Administered 2019-09-24: 10:00:00 40 meq via ORAL
  Filled 2019-09-24: qty 30

## 2019-09-24 MED ORDER — HEPARIN SOD (PORK) LOCK FLUSH 100 UNIT/ML IV SOLN
500.0000 [IU] | Freq: Once | INTRAVENOUS | Status: AC
  Administered 2019-09-24: 500 [IU]
  Filled 2019-09-24: qty 5

## 2019-09-24 MED ORDER — ONDANSETRON HCL 4 MG PO TABS: 4.0000 mg | ORAL_TABLET | Freq: Four times a day (QID) | ORAL | 0 refills | Status: AC

## 2019-09-24 MED ORDER — FENTANYL CITRATE (PF) 100 MCG/2ML IJ SOLN
50.0000 ug | Freq: Once | INTRAMUSCULAR | Status: AC
  Administered 2019-09-24: 50 ug via INTRAVENOUS
  Filled 2019-09-24: qty 2

## 2019-09-24 NOTE — ED Notes (Signed)
EDP [facility] made aware of critical potassium result of 2.3.

## 2019-09-24 NOTE — Discharge Instructions (Addendum)
Follow-up with your primary care doctor to have your electrolytes and kidney function rechecked.  Prefer this to be tomorrow or the following day.  Please return to the ED if symptoms worsen.  Increase oral hydration.  Take 20 mEq of potassium for the next 3 to 4 days

## 2019-09-24 NOTE — ED Triage Notes (Signed)
Patient here from home with complaints of generalized body aches, abd pain, nausea that started Monday.

## 2019-09-24 NOTE — ED Notes (Signed)
Patient transported to CT 

## 2019-09-24 NOTE — ED Provider Notes (Addendum)
[place] DEPT Provider Note  CSN: [id number] Arrival date & time: 09/24/19 0359  Chief Complaint(s) Abdominal Pain, Nausea, and Generalized Body Aches  HPI Cindy [name] is a 28 y.o. female    Abdominal Pain Pain location:  Generalized Pain quality: not sharp   Pain radiates to:  Does not radiate Pain severity:  Moderate Onset quality:  Gradual Duration:  2 days Timing:  Constant Progression:  Waxing and waning Worsened by:  Movement Associated symptoms: nausea   Associated symptoms: no chills, no constipation, no cough, no diarrhea, no fever and no vomiting    H/o colon cancer s/p ileostomy. In remission for 6 yrs.  Past Medical History Past Medical History:  Diagnosis Date  . Bilateral flank pain    due to ureteral stents  . Chronic hypokalemia   . CKD (chronic kidney disease), stage II   . 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 Complication of anesthesia    post op aspiration pneumonia w/ lap. appy 12-23-2007 (emergent ruptured appendix)  . FAP (familial adenomatous polyposis) 09/30/2014  . Heart murmur   . History of acute renal failure    2015;  2016  . History of fetal demise, not currently pregnant    03-22-2013  stillborn at 60 wks  . History of kidney stones   . History of sepsis    admitted 12-03-2016 (post-op day 2 w/ bilateral ureteral stents and stone manipluation) discharged 12-06-2016  for urosepsis  . Hypomagnesemia   . Ileostomy in place [facility])   . Iron deficiency anemia   . Nephrolithiasis    BILATERAL   . Normocytic anemia   . Renal cyst, left    PER CT 11-12-2016  . Urgency of urination    Patient Active Problem List   Diagnosis Date Noted  . Lower abdominal pain 07/13/2018  . Elevated lipase 07/13/2018  . AKI  (acute kidney injury) ([place]) 09/02/2017  . Dehydration 06/07/2017  . Sepsis, unspecified organism ([place]) 12/03/2016  . 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 Normocytic anemia 05/02/2015  . Hypotension   . High output ileostomy ([place]) 09/30/2014  . FAP (familial adenomatous polyposis) 09/30/2014  . Nephrolithiasis 09/30/2014  . Adenocarcinoma of colon ([place]) 05/26/2013  . Nausea with vomiting 05/21/2013  . Microcytic anemia 04/25/2013  . Hypokalemia 04/25/2013   Home Medication(s) Prior to Admission medications   Medication Sig Start Date End Date Taking? Authorizing Provider  cetirizine (ZYRTEC) 10 MG tablet Take 1 tablet (10 mg total) by mouth daily. Patient taking differently: Take 10 mg by mouth daily as needed for allergies.  07/09/19  Yes Bast, Traci A, NP  cyanocobalamin (,VITAMIN B-12,) 1000 MCG/ML injection Inject 1,000 mcg into the muscle every 30 (thirty) days.   Yes [provider]  loperamide (IMODIUM) 1 MG/5ML solution Take 3 mg by mouth 3 (three) times daily as needed for diarrhea or loose stools.   Yes [provider]  LORazepam (ATIVAN) 1 MG tablet Take 1 mg by mouth 3 (three) times daily as needed for anxiety.   Yes [provider]  magnesium oxide (MAG-OX) 400 MG tablet Take 400 mg by mouth at bedtime.   Yes [provider]  Multiple Vitamins-Minerals (MULTIVITAMIN GUMMIES ADULT) CHEW Chew 2 each by mouth daily.  Yes [provider]  Probiotic Product (PROBIOTIC DAILY PO) Take 1 capsule by mouth daily.   Yes [provider]  Vitamin D, Ergocalciferol, (DRISDOL) 50000 units CAPS capsule Take 50,000 Units by mouth every Monday.  05/22/16  Yes [provider]  diclofenac sodium (VOLTAREN) 1 % GEL Apply 2 g topically 4 (four) times daily. Patient not taking: Reported on 09/24/2019 07/20/19   Petrucelli, Glynda Jaeger, PA-C  HYDROcodone-acetaminophen (NORCO/VICODIN) 5-325 MG tablet Take 1 tablet by mouth 2  times daily at 12 noon and 4 pm. Patient not taking: Reported on 09/24/2019 07/23/19   Couture, Cortni S, PA-C  potassium chloride 20 MEQ/15ML (10%) SOLN Take 20 mLs (26.6667 mEq total) by mouth 3 (three) times daily. Patient not taking: Reported on 07/20/2019 09/04/17   Bonnielee Haff, MD                                                                                                                                    Past Surgical History Past Surgical History:  Procedure Laterality Date  . COLONOSCOPY N/A 05/24/2013   Procedure: COLONOSCOPY;  Surgeon: Missy Sabins, MD;  Location: [place];  Service: Endoscopy;  Laterality: N/A;  . COMPLETION PROTECTOMY AND REVISION ILEOSTOMY/ LYSIS ADHESIONS  07-28-2014    [facility]  . CYSTOSCOPY W/ RETROGRADES Right 12/15/2016   Procedure: CYSTOSCOPY WITH RETROGRADE PYELOGRAM;  Surgeon: Alexis Frock, MD;  Location: [facility];  Service: Urology;  Laterality: Right;  . CYSTOSCOPY W/ URETERAL STENT PLACEMENT Bilateral 11/01/2016   Procedure: CYSTOSCOPY WITH BILATERAL RETROGRADE PYELOGRAM BILATERAL URETERAL STENT PLACEMENT;  Surgeon: Alexis Frock, MD;  Location: [facility];  Service: Urology;  Laterality: Bilateral;  . CYSTOSCOPY W/ URETERAL STENT PLACEMENT Right 12/15/2016   Procedure: CYSTOSCOPY WITH STENT REPLACEMENT;  Surgeon: Alexis Frock, MD;  Location: [facility];  Service: Urology;  Laterality: Right;  . CYSTOSCOPY W/ URETERAL STENT REMOVAL Bilateral 12/15/2016   Procedure: CYSTOSCOPY WITH STENT REMOVAL;  Surgeon: Alexis Frock, MD;  Location: [facility];  Service: Urology;  Laterality: Bilateral;  . CYSTOSCOPY WITH URETEROSCOPY Bilateral 12/01/2016   Procedure: FIRST STAGE CYSTOSCOPY WITH URETEROSCOPY,  STONE MANIPULATION and BASKETRY, STENT EXCHANGE;  Surgeon: Alexis Frock, MD;  Location: [facility];  Service: Urology;  Laterality: Bilateral;  . CYSTOSCOPY WITH URETEROSCOPY Right 12/15/2016    Procedure: SECOND STAGE -CYSTOSCOPY WITH URETEROSCOPY AND STONE EXTRACTION WITH BASKET;  Surgeon: Alexis Frock, MD;  Location: [facility];  Service: Urology;  Laterality: Right;  . HOLMIUM LASER APPLICATION Bilateral 04/29/6947   Procedure: HOLMIUM LASER APPLICATION;  Surgeon: Alexis Frock, MD;  Location: [facility];  Service: Urology;  Laterality: Bilateral;  . HOLMIUM LASER APPLICATION Right 5/46/2703   Procedure: HOLMIUM LASER APPLICATION;  Surgeon: Alexis Frock, MD;  Location: [facility];  Service: Urology;  Laterality: Right;  . I&D EXTREMITY Left 11/14/2013   Procedure: IRRIGATION AND  DEBRIDEMENT LEFT LONG FINGER;  Surgeon: Tennis Must, MD;  Location: [place];  Service: Orthopedics;  Laterality: Left;  . IM NAILING TIBIA Left 08/23/2010  . LAPAROSCOPIC APPENDECTOMY  12/23/2007  . LEG RECONSTRUCTION USING FASCIAL FLAP  ~ 2009  . SUBTOTAL COLECTOMY /  RESECTION ENBLOC DISTAL ILEUM/  ILEOSTOMY  07/ 2014  [facility]  . TRANSTHORACIC ECHOCARDIOGRAM  11/11/2011   ef 55-60%/  mild MR/  trivial PR and TR/  PASP 29-40mmHg/  trivial pericardial effusion identified   Family History Family History  Adopted: Yes  Problem Relation Age of Onset  . ALS Mother   . Healthy Father   . Alcohol abuse Neg Hx   . Arthritis Neg Hx   . Asthma Neg Hx   . Birth defects Neg Hx   . Cancer Neg Hx   . COPD Neg Hx   . Depression Neg Hx   . Diabetes Neg Hx   . Drug abuse Neg Hx   . Early death Neg Hx   . Hearing loss Neg Hx   . Heart disease Neg Hx   . Hyperlipidemia Neg Hx   . Hypertension Neg Hx   . Kidney disease Neg Hx   . Learning disabilities Neg Hx   . Mental illness Neg Hx   . Mental retardation Neg Hx   . Miscarriages / Stillbirths Neg Hx   . Stroke Neg Hx   . Vision loss Neg Hx     Social History Social History   Tobacco Use  . Smoking status: Never Smoker  . Smokeless tobacco: Never Used  Substance Use Topics  .  Alcohol use: No  . Drug use: No   Allergies Iron, Venofer [ferric oxide], Soap, Food, Morphine and related, Sulfa antibiotics, Sulfamethoxazole-trimethoprim, and Levaquin [levofloxacin]  Review of Systems Review of Systems  Constitutional: Negative for chills and fever.  Respiratory: Negative for cough.   Gastrointestinal: Positive for abdominal pain and nausea. Negative for constipation, diarrhea and vomiting.   All other systems are reviewed and are negative for acute change except as noted in the HPI  Physical Exam Vital Signs  I have reviewed the triage vital signs BP 95/71 (BP Location: Right Arm)   Pulse (!) 117   Temp 98.6 F (37 C) (Oral)   Resp 14   SpO2 98%   Physical Exam Vitals signs reviewed.  Constitutional:      General: She is not in acute distress.    Appearance: She is well-developed and underweight. She is not diaphoretic.  HENT:     Head: Normocephalic and atraumatic.     Right Ear: External ear normal.     Left Ear: External ear normal.     Nose: Nose normal.  Eyes:     General: No scleral icterus.    Conjunctiva/sclera: Conjunctivae normal.  Neck:     Musculoskeletal: Normal range of motion.     Trachea: Phonation normal.  Cardiovascular:     Rate and Rhythm: Normal rate and regular rhythm.  Pulmonary:     Effort: Pulmonary effort is normal. No respiratory distress.     Breath sounds: No stridor.  Abdominal:     General: There is no distension.     Tenderness: There is abdominal tenderness in the periumbilical area, suprapubic area, left upper quadrant and left lower quadrant. There is guarding. There is no rebound.    Musculoskeletal: Normal range of motion.  Neurological:     Mental Status: She is alert and  oriented to person, place, and time.  Psychiatric:        Behavior: Behavior normal.     ED Results and Treatments Labs (all labs ordered are listed, but only abnormal results are displayed) Labs Reviewed  LIPASE, BLOOD -  Abnormal; Notable for the following components:      Result Value   Lipase 65 (*)    All other components within normal limits  COMPREHENSIVE METABOLIC PANEL - Abnormal; Notable for the following components:   Sodium 129 (*)    Potassium 2.3 (*)    CO2 16 (*)    Glucose, Bld 113 (*)    BUN 24 (*)    Creatinine, Ser 2.30 (*)    Total Protein 9.3 (*)    GFR calc non Af Amer 28 (*)    GFR calc Af Amer 32 (*)    All other components within normal limits  CBC - Abnormal; Notable for the following components:   WBC 12.3 (*)    Hemoglobin 10.6 (*)    HCT 33.7 (*)    MCV 70.5 (*)    MCH 22.2 (*)    RDW 16.1 (*)    Platelets 494 (*)    All other components within normal limits  MAGNESIUM  URINALYSIS, ROUTINE W REFLEX MICROSCOPIC  I-STAT BETA HCG BLOOD, ED ([facility], [facility], [facility] ONLY)                                                                                                                         EKG  EKG Interpretation  Date/Time:  Wednesday September 24 2019 07:26:59 EDT Ventricular Rate:  86 PR Interval:    QRS Duration: 77 QT Interval:  355 QTC Calculation: 425 R Axis:   92 Text Interpretation: Sinus rhythm Consider right atrial enlargement Borderline right axis deviation QT has shortened Otherwise no significant change Confirmed by Addison Lank ([id number]) on 09/24/2019 7:31:03 AM      Radiology No results found.  Pertinent labs & imaging results that were available during my care of the patient were reviewed by me and considered in my medical decision making (see chart for details).  Medications Ordered in ED Medications  sodium chloride flush (NS) 0.9 % injection 3 mL (3 mLs Intravenous Given 09/24/19 0503)  sodium chloride 0.9 % bolus 1,000 mL (0 mLs Intravenous Stopped 09/24/19 0613)  fentaNYL (SUBLIMAZE) injection 50 mcg (50 mcg Intravenous Given 09/24/19 0521)  ondansetron (ZOFRAN) injection 4 mg (4 mg Intravenous Given 09/24/19 0521)  potassium chloride 10 mEq in 100 mL  IVPB (0 mEq Intravenous Stopped 09/24/19 0702)  iohexol (OMNIPAQUE) 300 MG/ML solution 30 mL (30 mLs Oral Contrast Given 09/24/19 0610)  fentaNYL (SUBLIMAZE) injection 100 mcg (100 mcg Intravenous Given 09/24/19 0642)  metoCLOPramide (REGLAN) injection 10 mg (10 mg Intravenous Given 09/24/19 0642)  sodium chloride 0.9 % bolus 1,000 mL (1,000 mLs Intravenous New Bag/Given 09/24/19 0725)  Procedures Procedures  (including critical care time)  Medical Decision Making / ED Course I have reviewed the nursing notes for this encounter and the patient's prior records (if available in EHR or on provided paperwork).   Cindy [name] was evaluated in Emergency Department on 09/24/2019 for the symptoms described in the history of present illness. She was evaluated in the context of the global COVID-19 pandemic, which necessitated consideration that the patient might be at risk for infection with the SARS-CoV-2 virus that causes COVID-19. Institutional protocols and algorithms that pertain to the evaluation of patients at risk for COVID-19 are in a state of rapid change based on information released by regulatory bodies including the CDC and federal and state organizations. These policies and algorithms were followed during the patient's care in the ED.  Abdominal pain with tenderness to palpation. Patient noted to be significantly tachycardic and hypotensive on arrival. She is afebrile. Labs notable for leukocytosis, stable hemoglobin, worsening AKI, mild hyponatremia and hypokalemia.  Lipase mildly elevated. CT to assess any significant intra-abdominal Fama to assess infectious process.  Low suspicion for small bowel obstruction but this will be determined on CT. Potassium repleted via IV.  Provided with IVF and pain meds.  Patient care turned over to Dr Ronnald Nian. Patient  case and results discussed in detail; please see their note for further ED managment.          This chart was dictated using voice recognition software.  Despite best efforts to proofread,  errors can occur which can change the documentation meaning.     Fatima Blank, MD 09/24/19 [phone]

## 2019-09-24 NOTE — ED Provider Notes (Signed)
Assumed care of patient at 7 AM from Dr. Leonette Monarch.  Patient here with nausea, abdominal pain.  With some mild tachycardia.  Low blood pressure upon arrival.  Has a history of colon cancer status post ostomy.  Has had increased ostomy output.  Does not have a fever.  Has some mild AKI from baseline and potassium of 2.3 slightly lower than her baseline at 2.5.  Patient had mild elevation of lipase.  Has already had improvement with IV fluids, antiemetics, pain medication.  CT scan was overall unremarkable.  No bowel obstruction.  Urinalysis overall equivocal but will be conservative and treat with antibiotics.  Patient prefers to be discharged to home.  Has been able to tolerate p.o. well in the ED.  Got a liter and a half of fluids.  Had potassium repletion.  Will increase her home potassium over the next several days.  We will have her follow-up closely with primary care doctor in the next 1 to 2 days to repeat lab work.  She has not had any active nausea or vomiting while under my care.  Will treat with Zofran.  Understands return precautions but after shared decision she prefers discharge to home which I think is reasonable given that she is able to tolerate p.o.  This chart was dictated using voice recognition software.  Despite best efforts to proofread,  errors can occur which can change the documentation meaning.     Lennice Sites, DO 09/24/19 1012

## 2019-09-25 LAB — URINE CULTURE

## 2019-11-03 ENCOUNTER — Other Ambulatory Visit: Payer: Self-pay

## 2019-11-03 ENCOUNTER — Emergency Department (HOSPITAL_COMMUNITY)
Admission: EM | Admit: 2019-11-03 | Discharge: 2019-11-03 | Disposition: A | Discharge status: Home / Self Care | Payer: PRIVATE HEALTH INSURANCE | Attending: Emergency Medicine | Admitting: Emergency Medicine

## 2019-11-03 ENCOUNTER — Encounter (HOSPITAL_COMMUNITY): Payer: Self-pay

## 2019-11-03 DIAGNOSIS — R110 Nausea: Secondary | ICD-10-CM | POA: Insufficient documentation

## 2019-11-03 DIAGNOSIS — E876 Hypokalemia: Secondary | ICD-10-CM | POA: Diagnosis not present

## 2019-11-03 DIAGNOSIS — Z79899 Other long term (current) drug therapy: Secondary | ICD-10-CM | POA: Insufficient documentation

## 2019-11-03 DIAGNOSIS — R109 Unspecified abdominal pain: Secondary | ICD-10-CM | POA: Diagnosis not present

## 2019-11-03 DIAGNOSIS — R000 Tachycardia, unspecified: Secondary | ICD-10-CM | POA: Diagnosis present

## 2019-11-03 DIAGNOSIS — N182 Chronic kidney disease, stage 2 (mild): Secondary | ICD-10-CM | POA: Insufficient documentation

## 2019-11-03 LAB — CBC
HCT: 30.8 % — ABNORMAL LOW (ref 36.0–46.0)
Hemoglobin: 9.8 g/dL — ABNORMAL LOW (ref 12.0–15.0)
MCH: 22.7 pg — ABNORMAL LOW (ref 26.0–34.0)
MCHC: 31.8 g/dL (ref 30.0–36.0)
MCV: 71.5 fL — ABNORMAL LOW (ref 80.0–100.0)
Platelets: 436 10*3/uL — ABNORMAL HIGH (ref 150–400)
RBC: 4.31 MIL/uL (ref 3.87–5.11)
RDW: 14.3 % (ref 11.5–15.5)
WBC: 11.7 10*3/uL — ABNORMAL HIGH (ref 4.0–10.5)
nRBC: 0 % (ref 0.0–0.2)

## 2019-11-03 LAB — LIPASE, BLOOD: Lipase: 78 U/L — ABNORMAL HIGH (ref 11–51)

## 2019-11-03 LAB — COMPREHENSIVE METABOLIC PANEL
ALT: 12 U/L (ref 0–44)
AST: 19 U/L (ref 15–41)
Albumin: 4.3 g/dL (ref 3.5–5.0)
Alkaline Phosphatase: 85 U/L (ref 38–126)
Anion gap: 14 (ref 5–15)
BUN: 29 mg/dL — ABNORMAL HIGH (ref 6–20)
CO2: 17 mmol/L — ABNORMAL LOW (ref 22–32)
Calcium: 9.1 mg/dL (ref 8.9–10.3)
Chloride: 98 mmol/L (ref 98–111)
Creatinine, Ser: 2.1 mg/dL — ABNORMAL HIGH (ref 0.44–1.00)
GFR calc Af Amer: 36 mL/min — ABNORMAL LOW (ref >60)
GFR calc non Af Amer: 31 mL/min — ABNORMAL LOW (ref >60)
Glucose, Bld: 115 mg/dL — ABNORMAL HIGH (ref 70–99)
Potassium: 2.6 mmol/L — CL (ref 3.5–5.1)
Sodium: 129 mmol/L — ABNORMAL LOW (ref 135–145)
Total Bilirubin: 0.6 mg/dL (ref 0.3–1.2)
Total Protein: 8.3 g/dL — ABNORMAL HIGH (ref 6.5–8.1)

## 2019-11-03 LAB — BASIC METABOLIC PANEL
Anion gap: 8 (ref 5–15)
BUN: 24 mg/dL — ABNORMAL HIGH (ref 6–20)
CO2: 14 mmol/L — ABNORMAL LOW (ref 22–32)
Calcium: 7.6 mg/dL — ABNORMAL LOW (ref 8.9–10.3)
Chloride: 107 mmol/L (ref 98–111)
Creatinine, Ser: 1.71 mg/dL — ABNORMAL HIGH (ref 0.44–1.00)
GFR calc Af Amer: 46 mL/min — ABNORMAL LOW (ref >60)
GFR calc non Af Amer: 39 mL/min — ABNORMAL LOW (ref >60)
Glucose, Bld: 102 mg/dL — ABNORMAL HIGH (ref 70–99)
Potassium: 3 mmol/L — ABNORMAL LOW (ref 3.5–5.1)
Sodium: 129 mmol/L — ABNORMAL LOW (ref 135–145)

## 2019-11-03 LAB — I-STAT BETA HCG BLOOD, ED (MC, WL, AP ONLY): I-stat hCG, quantitative: <5 m[IU]/mL (ref <5)

## 2019-11-03 LAB — LACTIC ACID, PLASMA: Lactic Acid, Venous: 1.5 mmol/L (ref 0.5–1.9)

## 2019-11-03 LAB — MAGNESIUM: Magnesium: 1.7 mg/dL (ref 1.7–2.4)

## 2019-11-03 MED ORDER — POTASSIUM CHLORIDE CRYS ER 20 MEQ PO TBCR
60.0000 meq | EXTENDED_RELEASE_TABLET | Freq: Once | ORAL | Status: AC
  Administered 2019-11-03: 60 meq via ORAL
  Filled 2019-11-03: qty 3

## 2019-11-03 MED ORDER — SODIUM CHLORIDE 0.9% FLUSH: 3.0000 mL | Freq: Once | INTRAVENOUS | Status: DC

## 2019-11-03 MED ORDER — HEPARIN SOD (PORK) LOCK FLUSH 100 UNIT/ML IV SOLN
500.0000 [IU] | Freq: Once | INTRAVENOUS | Status: AC
  Administered 2019-11-03: 500 [IU]
  Filled 2019-11-03: qty 5

## 2019-11-03 MED ORDER — FENTANYL CITRATE (PF) 100 MCG/2ML IJ SOLN
50.0000 ug | Freq: Once | INTRAMUSCULAR | Status: AC
  Administered 2019-11-03: 50 ug via INTRAVENOUS
  Filled 2019-11-03: qty 2

## 2019-11-03 MED ORDER — LACTATED RINGERS IV BOLUS
500.0000 mL | Freq: Once | INTRAVENOUS | Status: AC
  Administered 2019-11-03: 500 mL via INTRAVENOUS

## 2019-11-03 MED ORDER — POTASSIUM CHLORIDE 10 MEQ/100ML IV SOLN
10.0000 meq | Freq: Once | INTRAVENOUS | Status: AC
  Administered 2019-11-03: 10 meq via INTRAVENOUS
  Filled 2019-11-03: qty 100

## 2019-11-03 MED ORDER — SODIUM CHLORIDE 0.9 % IV BOLUS
2000.0000 mL | Freq: Once | INTRAVENOUS | Status: AC
  Administered 2019-11-03: 2000 mL via INTRAVENOUS

## 2019-11-03 MED ORDER — SODIUM CHLORIDE 0.9 % IV SOLN: INTRAVENOUS | Status: DC

## 2019-11-03 NOTE — ED Provider Notes (Signed)
[facility] DEPT Provider Note   CSN: [id number] Arrival date & time: 11/03/19  1022     History   Chief Complaint Chief Complaint  Patient presents with  . Tachycardia  . Abdominal Pain  . Nausea    HPI Cindy [name] is a 28 y.o. female.     28 y/o w/ history of colon CA presents with diffuse abd pain and tachycardia At work today and felt lightheaded and dizzy No fever or emesis abd pain is across abd and no blood in ileostomy bag No cough/congestion/dyspnes No vag bleeding or discharge No tx used pta     Past Medical History:  Diagnosis Date  . Bilateral flank pain    due to ureteral stents  . Chronic hypokalemia   . CKD (chronic kidney disease), stage II   . 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 Complication of anesthesia    post op aspiration pneumonia w/ lap. appy 12-23-2007 (emergent ruptured appendix)  . FAP (familial adenomatous polyposis) 09/30/2014  . Heart murmur   . History of acute renal failure    2015;  2016  . History of fetal demise, not currently pregnant    03-22-2013  stillborn at 66 wks  . History of kidney stones   . History of sepsis    admitted 12-03-2016 (post-op day 2 w/ bilateral ureteral stents and stone manipluation) discharged 12-06-2016  for urosepsis  . Hypomagnesemia   . Ileostomy in place [facility])   . Iron deficiency anemia   . Nephrolithiasis    BILATERAL   . Normocytic anemia   . Renal cyst, left    PER CT 11-12-2016  . Urgency of urination     Patient Active Problem List   Diagnosis Date Noted  . Lower abdominal pain 07/13/2018  . Elevated lipase 07/13/2018  . AKI (acute kidney injury) ([place]) 09/02/2017  . Dehydration 06/07/2017  . Sepsis, unspecified organism ([place]) 12/03/2016  . CKD (chronic  kidney disease), stage II 12/03/2016  . Normocytic anemia 05/02/2015  . Hypotension   . High output ileostomy ([place]) 09/30/2014  . FAP (familial adenomatous polyposis) 09/30/2014  . Nephrolithiasis 09/30/2014  . Adenocarcinoma of colon ([place]) 05/26/2013  . Nausea with vomiting 05/21/2013  . Microcytic anemia 04/25/2013  . Hypokalemia 04/25/2013    Past Surgical History:  Procedure Laterality Date  . COLONOSCOPY N/A 05/24/2013   Procedure: COLONOSCOPY;  Surgeon: Missy Sabins, MD;  Location: [facility];  Service: Endoscopy;  Laterality: N/A;  . COMPLETION PROTECTOMY AND REVISION ILEOSTOMY/ LYSIS ADHESIONS  07-28-2014    [facility]  . CYSTOSCOPY W/ RETROGRADES Right 12/15/2016   Procedure: CYSTOSCOPY WITH RETROGRADE PYELOGRAM;  Surgeon: Alexis Frock, MD;  Location: [facility];  Service: Urology;  Laterality: Right;  . CYSTOSCOPY W/ URETERAL STENT PLACEMENT Bilateral 11/01/2016   Procedure: CYSTOSCOPY WITH BILATERAL RETROGRADE PYELOGRAM BILATERAL URETERAL STENT PLACEMENT;  Surgeon: Alexis Frock, MD;  Location[facility];  Service: Urology;  Laterality: Bilateral;  . CYSTOSCOPY W/ URETERAL STENT PLACEMENT Right 12/15/2016   Procedure: CYSTOSCOPY WITH STENT REPLACEMENT;  Surgeon: Alexis Frock, MD;  Location: [facility];  Service: Urology;  Laterality: Right;  . CYSTOSCOPY W/ URETERAL STENT REMOVAL Bilateral 12/15/2016   Procedure: CYSTOSCOPY WITH STENT REMOVAL;  Surgeon: Alexis Frock, MD;  Location: [place];  Service: Urology;  Laterality: Bilateral;  . CYSTOSCOPY WITH URETEROSCOPY Bilateral 12/01/2016   Procedure: FIRST STAGE CYSTOSCOPY WITH URETEROSCOPY,  STONE MANIPULATION and BASKETRY, STENT EXCHANGE;  Surgeon: Alexis Frock, MD;  Location: [facility];  Service: Urology;  Laterality: Bilateral;  . CYSTOSCOPY WITH URETEROSCOPY Right 12/15/2016   Procedure: SECOND STAGE -CYSTOSCOPY WITH URETEROSCOPY AND STONE EXTRACTION WITH BASKET;   Surgeon: Alexis Frock, MD;  Location: [facility];  Service: Urology;  Laterality: Right;  . HOLMIUM LASER APPLICATION Bilateral 01/06/4764   Procedure: HOLMIUM LASER APPLICATION;  Surgeon: Alexis Frock, MD;  Location: [facility];  Service: Urology;  Laterality: Bilateral;  . HOLMIUM LASER APPLICATION Right 4/65/0354   Procedure: HOLMIUM LASER APPLICATION;  Surgeon: Alexis Frock, MD;  Location: [facility];  Service: Urology;  Laterality: Right;  . I&D EXTREMITY Left 11/14/2013   Procedure: IRRIGATION AND DEBRIDEMENT LEFT LONG FINGER;  Surgeon: Tennis Must, MD;  Location: [place];  Service: Orthopedics;  Laterality: Left;  . IM NAILING TIBIA Left 08/23/2010  . LAPAROSCOPIC APPENDECTOMY  12/23/2007  . LEG RECONSTRUCTION USING FASCIAL FLAP  ~ 2009  . SUBTOTAL COLECTOMY /  RESECTION ENBLOC DISTAL ILEUM/  ILEOSTOMY  07/ 2014  [facility]  . TRANSTHORACIC ECHOCARDIOGRAM  11/11/2011   ef 55-60%/  mild MR/  trivial PR and TR/  PASP 29-92mmHg/  trivial pericardial effusion identified     OB History    Gravida  1   Para  1   Term  0   Preterm  1   AB  0   Living        SAB  0   TAB  0   Ectopic  0   Multiple      Live Births               Home Medications    Prior to Admission medications   Medication Sig Start Date End Date Taking? Authorizing Provider  cetirizine (ZYRTEC) 10 MG tablet Take 1 tablet (10 mg total) by mouth daily. Patient taking differently: Take 10 mg by mouth daily as needed for allergies.  07/09/19   Bast, Tressia Miners A, NP  cyanocobalamin (,VITAMIN B-12,) 1000 MCG/ML injection Inject 1,000 mcg into the muscle every 30 (thirty) days.    [provider]  diclofenac sodium (VOLTAREN) 1 % GEL Apply 2 g topically 4 (four) times daily. Patient not taking: Reported on 09/24/2019 07/20/19   Petrucelli, Glynda Jaeger, PA-C  HYDROcodone-acetaminophen (NORCO/VICODIN) 5-325 MG tablet Take 1 tablet by  mouth 2 times daily at 12 noon and 4 pm. Patient not taking: Reported on 09/24/2019 07/23/19   Couture, Cortni S, PA-C  loperamide (IMODIUM) 1 MG/5ML solution Take 3 mg by mouth 3 (three) times daily as needed for diarrhea or loose stools.    [provider]  LORazepam (ATIVAN) 1 MG tablet Take 1 mg by mouth 3 (three) times daily as needed for anxiety.    [provider]  magnesium oxide (MAG-OX) 400 MG tablet Take 400 mg by mouth at bedtime.    [provider]  Multiple Vitamins-Minerals (MULTIVITAMIN GUMMIES ADULT) CHEW Chew 2 each by mouth daily.     [provider]  potassium chloride 20 MEQ/15ML (10%) SOLN Take 20 mLs (26.6667 mEq total) by mouth 3 (three) times daily. Patient not taking: Reported on 07/20/2019 09/04/17   Bonnielee Haff, MD  Probiotic Product (PROBIOTIC DAILY PO) Take 1 capsule by mouth  daily.    [provider]  Vitamin D, Ergocalciferol, (DRISDOL) 50000 units CAPS capsule Take 50,000 Units by mouth every Monday.  05/22/16   [provider]    Family History Family History  Adopted: Yes  Problem Relation Age of Onset  . ALS Mother   . Healthy Father   . Alcohol abuse Neg Hx   . Arthritis Neg Hx   . Asthma Neg Hx   . Birth defects Neg Hx   . Cancer Neg Hx   . COPD Neg Hx   . Depression Neg Hx   . Diabetes Neg Hx   . Drug abuse Neg Hx   . Early death Neg Hx   . Hearing loss Neg Hx   . Heart disease Neg Hx   . Hyperlipidemia Neg Hx   . Hypertension Neg Hx   . Kidney disease Neg Hx   . Learning disabilities Neg Hx   . Mental illness Neg Hx   . Mental retardation Neg Hx   . Miscarriages / Stillbirths Neg Hx   . Stroke Neg Hx   . Vision loss Neg Hx     Social History Social History   Tobacco Use  . Smoking status: Never Smoker  . Smokeless tobacco: Never Used  Substance Use Topics  . Alcohol use: No  . Drug use: No     Allergies   Iron, Venofer [ferric oxide], Soap, Food, Morphine and related,  Sulfa antibiotics, Sulfamethoxazole-trimethoprim, and Levaquin [levofloxacin]   Review of Systems Review of Systems  All other systems reviewed and are negative.    Physical Exam Updated Vital Signs BP (!) 75/54 (BP Location: Left Arm)   Pulse (!) 131   Temp 98.4 F (36.9 C) (Oral)   Resp 15   Ht 1.524 m (5')   Wt 42.2 kg   SpO2 100%   BMI 18.16 kg/m   Physical Exam Vitals signs and nursing note reviewed.  Constitutional:      General: She is not in acute distress.    Appearance: Normal appearance. She is well-developed. She is not toxic-appearing.  HENT:     Head: Normocephalic and atraumatic.  Eyes:     General: Lids are normal.     Conjunctiva/sclera: Conjunctivae normal.     Pupils: Pupils are equal, round, and reactive to light.  Neck:     Musculoskeletal: Normal range of motion and neck supple.     Thyroid: No thyroid mass.     Trachea: No tracheal deviation.  Cardiovascular:     Rate and Rhythm: Regular rhythm. Tachycardia present.     Heart sounds: Normal heart sounds. No murmur. No gallop.   Pulmonary:     Effort: Pulmonary effort is normal. No respiratory distress.     Breath sounds: Normal breath sounds. No stridor. No decreased breath sounds, wheezing, rhonchi or rales.  Abdominal:     General: Bowel sounds are normal. There is no distension.     Palpations: Abdomen is soft.     Tenderness: There is no abdominal tenderness. There is no guarding or rebound.    Musculoskeletal: Normal range of motion.        General: No tenderness.  Skin:    General: Skin is warm and dry.     Findings: No abrasion or rash.  Neurological:     Mental Status: She is alert and oriented to person, place, and time.     GCS: GCS eye subscore is 4. GCS verbal subscore is 5.  GCS motor subscore is 6.     Cranial Nerves: No cranial nerve deficit.     Sensory: No sensory deficit.  Psychiatric:        Speech: Speech normal.        Behavior: Behavior normal.      ED  Treatments / Results  Labs (all labs ordered are listed, but only abnormal results are displayed) Labs Reviewed  LIPASE, BLOOD  COMPREHENSIVE METABOLIC PANEL  CBC  URINALYSIS, ROUTINE W REFLEX MICROSCOPIC  LACTIC ACID, PLASMA  I-STAT BETA HCG BLOOD, ED ([facility], [facility], [facility] ONLY)  I-STAT BETA HCG BLOOD, ED ([facility], [facility], [facility] ONLY)    EKG None  Radiology No results found.  Procedures Procedures (including critical care time)  Medications Ordered in ED Medications  sodium chloride flush (NS) 0.9 % injection 3 mL (has no administration in time range)  0.9 %  sodium chloride infusion (has no administration in time range)  sodium chloride 0.9 % bolus 2,000 mL (has no administration in time range)  fentaNYL (SUBLIMAZE) injection 50 mcg (has no administration in time range)     Initial Impression / Assessment and Plan / ED Course  I have reviewed the triage vital signs and the nursing notes.  Pertinent labs & imaging results that were available during my care of the patient were reviewed by me and considered in my medical decision making (see chart for details).     Patient has potassium of 2.6 as well as creatinine of 2.1.  Review of her old records from [place] shows that this is been a chronic issue. Patient given oral as well as IV potassium here.  I spoke with patient's nephrologist Dr. Barry Dienes and he states that these are about her chronic numbers.  He requested a repeat potassium be drawn and if stable she can be discharged and follow-up with him.  Patient has responded well to fluids as well as IV pain medication.  Abdomen remains benign.   4:04 PM Spoke with patient's nephrologist again and her potassium is improved to 3.  Her creatinine has decreased.  Her CO2 has gone down unfortunately.  Though her anion gap has decreased.  Her nephrologist recommends that patient get a half a liter of lactated Ringer's and he will follow up with her.  Final Clinical Impressions(s) / ED Diagnoses    Final diagnoses:  None    ED Discharge Orders    None       Lacretia Leigh, MD 11/03/19 1605

## 2019-11-03 NOTE — ED Triage Notes (Signed)
Patient c/o mid abdominal pain, tachycardia, and nausea since this AM..

## 2019-12-08 ENCOUNTER — Encounter (HOSPITAL_COMMUNITY): Payer: Self-pay

## 2019-12-08 ENCOUNTER — Other Ambulatory Visit: Payer: Self-pay

## 2019-12-08 ENCOUNTER — Emergency Department (HOSPITAL_COMMUNITY)
Admission: EM | Admit: 2019-12-08 | Discharge: 2019-12-08 | Disposition: A | Discharge status: Home / Self Care | Payer: PRIVATE HEALTH INSURANCE | Attending: Emergency Medicine | Admitting: Emergency Medicine

## 2019-12-08 DIAGNOSIS — R519 Headache, unspecified: Secondary | ICD-10-CM

## 2019-12-08 DIAGNOSIS — E860 Dehydration: Secondary | ICD-10-CM

## 2019-12-08 DIAGNOSIS — E876 Hypokalemia: Secondary | ICD-10-CM | POA: Diagnosis not present

## 2019-12-08 DIAGNOSIS — N182 Chronic kidney disease, stage 2 (mild): Secondary | ICD-10-CM | POA: Insufficient documentation

## 2019-12-08 DIAGNOSIS — Z79899 Other long term (current) drug therapy: Secondary | ICD-10-CM | POA: Insufficient documentation

## 2019-12-08 LAB — CBC WITH DIFFERENTIAL/PLATELET
Abs Immature Granulocytes: 0.05 10*3/uL (ref 0.00–0.07)
Basophils Absolute: 0 10*3/uL (ref 0.0–0.1)
Basophils Relative: 0 %
Eosinophils Absolute: 0.1 10*3/uL (ref 0.0–0.5)
Eosinophils Relative: 1 %
HCT: 31.6 % — ABNORMAL LOW (ref 36.0–46.0)
Hemoglobin: 9.8 g/dL — ABNORMAL LOW (ref 12.0–15.0)
Immature Granulocytes: 1 %
Lymphocytes Relative: 15 %
Lymphs Abs: 1.5 10*3/uL (ref 0.7–4.0)
MCH: 22.6 pg — ABNORMAL LOW (ref 26.0–34.0)
MCHC: 31 g/dL (ref 30.0–36.0)
MCV: 72.8 fL — ABNORMAL LOW (ref 80.0–100.0)
Monocytes Absolute: 0.6 10*3/uL (ref 0.1–1.0)
Monocytes Relative: 7 %
Neutro Abs: 7.6 10*3/uL (ref 1.7–7.7)
Neutrophils Relative %: 76 %
Platelets: 423 10*3/uL — ABNORMAL HIGH (ref 150–400)
RBC: 4.34 MIL/uL (ref 3.87–5.11)
RDW: 15 % (ref 11.5–15.5)
WBC: 9.9 10*3/uL (ref 4.0–10.5)
nRBC: 0 % (ref 0.0–0.2)

## 2019-12-08 LAB — COMPREHENSIVE METABOLIC PANEL
ALT: 11 U/L (ref 0–44)
AST: 18 U/L (ref 15–41)
Albumin: 3.9 g/dL (ref 3.5–5.0)
Alkaline Phosphatase: 83 U/L (ref 38–126)
Anion gap: 12 (ref 5–15)
BUN: 15 mg/dL (ref 6–20)
CO2: 16 mmol/L — ABNORMAL LOW (ref 22–32)
Calcium: 9.2 mg/dL (ref 8.9–10.3)
Chloride: 102 mmol/L (ref 98–111)
Creatinine, Ser: 1.73 mg/dL — ABNORMAL HIGH (ref 0.44–1.00)
GFR calc Af Amer: 45 mL/min — ABNORMAL LOW (ref >60)
GFR calc non Af Amer: 39 mL/min — ABNORMAL LOW (ref >60)
Glucose, Bld: 103 mg/dL — ABNORMAL HIGH (ref 70–99)
Potassium: 2.6 mmol/L — CL (ref 3.5–5.1)
Sodium: 130 mmol/L — ABNORMAL LOW (ref 135–145)
Total Bilirubin: 0.5 mg/dL (ref 0.3–1.2)
Total Protein: 7.8 g/dL (ref 6.5–8.1)

## 2019-12-08 LAB — MAGNESIUM: Magnesium: 2 mg/dL (ref 1.7–2.4)

## 2019-12-08 LAB — I-STAT BETA HCG BLOOD, ED (MC, WL, AP ONLY): I-stat hCG, quantitative: <5 m[IU]/mL (ref <5)

## 2019-12-08 MED ORDER — METOCLOPRAMIDE HCL 5 MG/ML IJ SOLN
10.0000 mg | Freq: Once | INTRAMUSCULAR | Status: AC
  Administered 2019-12-08: 10 mg via INTRAVENOUS
  Filled 2019-12-08: qty 2

## 2019-12-08 MED ORDER — POTASSIUM CHLORIDE 10 MEQ/100ML IV SOLN
10.0000 meq | Freq: Once | INTRAVENOUS | Status: AC
  Administered 2019-12-08: 10 meq via INTRAVENOUS
  Filled 2019-12-08: qty 100

## 2019-12-08 MED ORDER — DIPHENHYDRAMINE HCL 50 MG/ML IJ SOLN
12.5000 mg | Freq: Once | INTRAMUSCULAR | Status: AC
  Administered 2019-12-08: 12.5 mg via INTRAVENOUS
  Filled 2019-12-08: qty 1

## 2019-12-08 MED ORDER — SODIUM CHLORIDE 0.9 % IV SOLN: Freq: Once | INTRAVENOUS | Status: AC

## 2019-12-08 MED ORDER — LACTATED RINGERS IV BOLUS
1000.0000 mL | Freq: Once | INTRAVENOUS | Status: AC
  Administered 2019-12-08: 1000 mL via INTRAVENOUS

## 2019-12-08 MED ORDER — POTASSIUM CHLORIDE CRYS ER 20 MEQ PO TBCR: 40.0000 meq | EXTENDED_RELEASE_TABLET | Freq: Three times a day (TID) | ORAL | 0 refills | Status: DC

## 2019-12-08 MED ORDER — POTASSIUM CHLORIDE 10 MEQ/100ML IV SOLN: 10.0000 meq | INTRAVENOUS | Status: DC

## 2019-12-08 MED ORDER — ONDANSETRON HCL 4 MG/2ML IJ SOLN
4.0000 mg | Freq: Once | INTRAMUSCULAR | Status: AC
  Administered 2019-12-08: 08:00:00 4 mg via INTRAVENOUS
  Filled 2019-12-08: qty 2

## 2019-12-08 MED ORDER — POTASSIUM CHLORIDE CRYS ER 20 MEQ PO TBCR
40.0000 meq | EXTENDED_RELEASE_TABLET | Freq: Once | ORAL | Status: AC
  Administered 2019-12-08: 10:00:00 40 meq via ORAL
  Filled 2019-12-08: qty 2

## 2019-12-08 NOTE — ED Notes (Signed)
No LR in stock room at this time. Supply is running downstairs to get some for the department. Will be a delay until he returns.

## 2019-12-08 NOTE — Discharge Instructions (Addendum)
Please drink plenty of water, take magnesium and potassium supplements as prescribed.  Please eat foods that are rich in potassium.  I have included information for you to read about dietary sources of potassium.

## 2019-12-08 NOTE — ED Provider Notes (Signed)
[facility] DEPT Provider Note   CSN: [id number] Arrival date & time: 12/08/19  3244     History Chief Complaint  Patient presents with  . Migraine    Cindy [name] is a 29 y.o. female.  HPI Patient is a 29 year old female with a history of chronic kidney disease, chronic hypomagnesium and hypokalemia on supplementation.  Status post colectomy for colon cancer history of FAP.  Patient presented today for severe, constant bandlike headache that she describes as pressure/achy that is only mildly improved with Tylenol ibuprofen.  She denies any fevers, chills, double vision, sensory loss or weakness.  Denies any bowel or bladder incontinence.  Does endorse some blurry vision for the past 3 days as well.  She states she has no history of migraines and states that she rarely has headaches.  She drinks coffee on a daily basis but has not drank coffee the past 3 days states that she has never had a caffeine withdrawal headache in the past however.  Patient denies any cough cold congestion body aches.  No blood thinners or trauma.      Past Medical History:  Diagnosis Date  . Bilateral flank pain    due to ureteral stents  . Chronic hypokalemia   . CKD (chronic kidney disease), stage II   . 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 Complication of anesthesia    post op aspiration pneumonia w/ lap. appy 12-23-2007 (emergent ruptured appendix)  . FAP (familial adenomatous polyposis) 09/30/2014  . Heart murmur   . History of acute renal failure    2015;  2016  . History of fetal demise, not currently pregnant    03-22-2013  stillborn at 34 wks  . History of kidney stones   . History of sepsis    admitted 12-03-2016 (post-op day 2 w/ bilateral ureteral stents and stone  manipluation) discharged 12-06-2016  for urosepsis  . Hypomagnesemia   . Ileostomy in place [facility])   . Iron deficiency anemia   . Nephrolithiasis    BILATERAL   . Normocytic anemia   . Renal cyst, left    PER CT 11-12-2016  . Urgency of urination     Patient Active Problem List   Diagnosis Date Noted  . Lower abdominal pain 07/13/2018  . Elevated lipase 07/13/2018  . AKI (acute kidney injury) ([place]) 09/02/2017  . Dehydration 06/07/2017  . Sepsis, unspecified organism ([place]) 12/03/2016  . 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 Normocytic anemia 05/02/2015  . Hypotension   . High output ileostomy ([place]) 09/30/2014  . FAP (familial adenomatous polyposis) 09/30/2014  . Nephrolithiasis 09/30/2014  . Adenocarcinoma of colon ([place]) 05/26/2013  . Nausea with vomiting 05/21/2013  . Microcytic anemia 04/25/2013  . Hypokalemia 04/25/2013    Past Surgical History:  Procedure Laterality Date  . COLONOSCOPY N/A 05/24/2013   Procedure: COLONOSCOPY;  Surgeon: Missy Sabins, MD;  Location: [facility];  Service: Endoscopy;  Laterality: N/A;  . COMPLETION PROTECTOMY AND REVISION ILEOSTOMY/ LYSIS ADHESIONS  07-28-2014    [facility]  . CYSTOSCOPY W/ RETROGRADES Right 12/15/2016   Procedure: CYSTOSCOPY WITH RETROGRADE PYELOGRAM;  Surgeon: Alexis Frock, MD;  Location: [facility];  Service: Urology;  Laterality: Right;  . CYSTOSCOPY W/ URETERAL STENT PLACEMENT Bilateral 11/01/2016  Procedure: CYSTOSCOPY WITH BILATERAL RETROGRADE PYELOGRAM BILATERAL URETERAL STENT PLACEMENT;  Surgeon: Alexis Frock, MD;  Location: [facility];  Service: Urology;  Laterality: Bilateral;  . CYSTOSCOPY W/ URETERAL STENT PLACEMENT Right 12/15/2016   Procedure: CYSTOSCOPY WITH STENT REPLACEMENT;  Surgeon: Alexis Frock, MD;  Location: [facility];  Service: Urology;  Laterality: Right;  . CYSTOSCOPY W/ URETERAL STENT REMOVAL Bilateral 12/15/2016   Procedure: CYSTOSCOPY WITH STENT REMOVAL;   Surgeon: Alexis Frock, MD;  Location: [facility];  Service: Urology;  Laterality: Bilateral;  . CYSTOSCOPY WITH URETEROSCOPY Bilateral 12/01/2016   Procedure: FIRST STAGE CYSTOSCOPY WITH URETEROSCOPY,  STONE MANIPULATION and BASKETRY, STENT EXCHANGE;  Surgeon: Alexis Frock, MD;  [facility];  Service: Urology;  Laterality: Bilateral;  . CYSTOSCOPY WITH URETEROSCOPY Right 12/15/2016   Procedure: SECOND STAGE -CYSTOSCOPY WITH URETEROSCOPY AND STONE EXTRACTION WITH BASKET;  Surgeon: Alexis Frock, MD;  Location: [facility];  Service: Urology;  Laterality: Right;  . HOLMIUM LASER APPLICATION Bilateral 02/01/1695   Procedure: HOLMIUM LASER APPLICATION;  Surgeon: Alexis Frock, MD;  Location: [facility];  Service: Urology;  Laterality: Bilateral;  . HOLMIUM LASER APPLICATION Right 7/89/3810   Procedure: HOLMIUM LASER APPLICATION;  Surgeon: Alexis Frock, MD;  Location: [facility];  Service: Urology;  Laterality: Right;  . I & D EXTREMITY Left 11/14/2013   Procedure: IRRIGATION AND DEBRIDEMENT LEFT LONG FINGER;  Surgeon: Tennis Must, MD;  Location: [facility];  Service: Orthopedics;  Laterality: Left;  . IM NAILING TIBIA Left 08/23/2010  . LAPAROSCOPIC APPENDECTOMY  12/23/2007  . LEG RECONSTRUCTION USING FASCIAL FLAP  ~ 2009  . SUBTOTAL COLECTOMY /  RESECTION ENBLOC DISTAL ILEUM/  ILEOSTOMY  07/ 2014  [facility]  . TRANSTHORACIC ECHOCARDIOGRAM  11/11/2011   ef 55-60%/  mild MR/  trivial PR and TR/  PASP 29-33mmHg/  trivial pericardial effusion identified     OB History    Gravida  1   Para  1   Term  0   Preterm  1   AB  0   Living        SAB  0   TAB  0   Ectopic  0   Multiple      Live Births              Family History  Adopted: Yes  Problem Relation Age of Onset  . ALS Mother   . Healthy Father   . Alcohol abuse Neg Hx   . Arthritis Neg Hx   . Asthma Neg Hx   .  Birth defects Neg Hx   . Cancer Neg Hx   . COPD Neg Hx   . Depression Neg Hx   . Diabetes Neg Hx   . Drug abuse Neg Hx   . Early death Neg Hx   . Hearing loss Neg Hx   . Heart disease Neg Hx   . Hyperlipidemia Neg Hx   . Hypertension Neg Hx   . Kidney disease Neg Hx   . Learning disabilities Neg Hx   . Mental illness Neg Hx   . Mental retardation Neg Hx   . Miscarriages / Stillbirths Neg Hx   . Stroke Neg Hx   . Vision loss Neg Hx     Social History   Tobacco Use  . Smoking status: Never Smoker  . Smokeless tobacco: Never Used  Substance Use Topics  . Alcohol use: No  .  Drug use: No    Home Medications Prior to Admission medications   Medication Sig Start Date End Date Taking? Authorizing Provider  cetirizine (ZYRTEC) 10 MG tablet Take 1 tablet (10 mg total) by mouth daily. Patient taking differently: Take 10 mg by mouth daily as needed for allergies.  07/09/19   Loura Halt A, NP  cyanocobalamin (,VITAMIN B-12,) 1000 MCG/ML injection Inject 1,000 mcg into the muscle every Tuesday.     [provider]  diclofenac sodium (VOLTAREN) 1 % GEL Apply 2 g topically 4 (four) times daily. Patient taking differently: Apply 2 g topically 4 (four) times daily as needed (pain).  07/20/19   Petrucelli, Glynda Jaeger, PA-C  HYDROcodone-acetaminophen (NORCO/VICODIN) 5-325 MG tablet Take 1 tablet by mouth 2 times daily at 12 noon and 4 pm. Patient not taking: Reported on 09/24/2019 07/23/19   Couture, Cortni S, PA-C  loperamide (IMODIUM) 1 MG/5ML solution Take 3 mg by mouth daily.     [provider]  LORazepam (ATIVAN) 1 MG tablet Take 1 mg by mouth 3 (three) times daily as needed for anxiety.    [provider]  magnesium oxide (MAG-OX) 400 MG tablet Take 400 mg by mouth every Wednesday.     [provider]  medroxyPROGESTERone (DEPO-PROVERA) 150 MG/ML injection Inject 150 mg into the muscle every 3 (three) months.    [provider]  Multiple  Vitamins-Minerals (MULTIVITAMIN GUMMIES ADULT) CHEW Chew 2 each by mouth daily.     [provider]  potassium chloride (KLOR-CON) 20 MEQ packet Take 40 mEq by mouth 2 (two) times daily.    [provider]  potassium chloride 20 MEQ/15ML (10%) SOLN Take 20 mLs (26.6667 mEq total) by mouth 3 (three) times daily. Patient taking differently: Take 40 mEq by mouth 3 (three) times daily.  09/04/17   Bonnielee Haff, MD  potassium chloride SA (KLOR-CON) 20 MEQ tablet Take 2 tablets (40 mEq total) by mouth 3 (three) times daily for 14 days. 12/08/19 12/22/19  Tedd Sias, PA  Vitamin D, Ergocalciferol, (DRISDOL) 50000 units CAPS capsule Take 50,000 Units by mouth every Monday.  05/22/16   [provider]    Allergies    Food, Iron, Venofer [ferric oxide], Soap, Morphine and related, Sulfa antibiotics, Sulfamethoxazole-trimethoprim, and Levaquin [levofloxacin]  Review of Systems   Review of Systems  Constitutional: Positive for fatigue. Negative for appetite change, chills and fever.  HENT: Negative for congestion.   Eyes: Negative for pain.  Respiratory: Negative for cough and shortness of breath.   Cardiovascular: Negative for chest pain and leg swelling.  Gastrointestinal: Negative for abdominal pain and vomiting.  Genitourinary: Negative for dysuria.  Musculoskeletal: Negative for myalgias.  Skin: Negative for rash.  Neurological: Positive for headaches. Negative for dizziness, tremors, seizures, syncope, speech difficulty, weakness, light-headedness and numbness.    Physical Exam Updated Vital Signs BP 91/64 (BP Location: Left Arm)   Pulse 90   Temp 98.4 F (36.9 C) (Oral)   Resp 18   Ht 5' (1.524 m)   Wt 43.5 kg   SpO2 100%   BMI 18.75 kg/m   Physical Exam Vitals and nursing note reviewed.  Constitutional:      General: She is not in acute distress.    Comments: Patient is alert and oriented x3 able answer questions appropriately and follow commands.   Is pleasant 29 year old female in no acute distress.  HENT:     Head: Normocephalic and atraumatic.     Nose:  Nose normal.     Mouth/Throat:     Mouth: Mucous membranes are dry.  Eyes:     General: No scleral icterus. Neck:     Comments: Neck is supple with no tenderness able to move and full range of motion.  Able to flex head chin to chest. No meningismus. Cardiovascular:     Rate and Rhythm: Normal rate and regular rhythm.     Pulses: Normal pulses.     Heart sounds: Normal heart sounds.  Pulmonary:     Effort: Pulmonary effort is normal. No respiratory distress.     Breath sounds: No wheezing.  Abdominal:     Palpations: Abdomen is soft.     Tenderness: There is no abdominal tenderness.     Comments: Ostomy in place.  With some output.  No tenderness abdomen.  No guarding or rebound.  No CVA tenderness.  Musculoskeletal:     Cervical back: Normal range of motion.     Right lower leg: No edema.     Left lower leg: No edema.     Comments: No lower extremity edema.  No calf swelling.  Skin:    General: Skin is warm and dry.     Capillary Refill: Capillary refill takes less than 2 seconds.     Comments: No rashes, bruising, skin with good turgor  Neurological:     Mental Status: She is alert. Mental status is at baseline.     Comments: Alert and oriented to self, place, time and event.   Speech is fluent, clear without dysarthria or dysphasia.   Strength 5/5 in upper/lower extremities  Sensation intact in upper/lower extremities   Negative Romberg. No pronator drift.  Normal finger-to-nose and feet tapping.  CN I not tested  CN II grossly intact visual fields bilaterally. Did not visualize posterior eye.   CN III, IV, VI PERRLA and EOMs intact bilaterally  CN V Intact sensation to sharp and light touch to the face  CN VII facial movements symmetric  CN VIII not tested  CN IX, X no uvula deviation, symmetric rise of soft palate  CN XI 5/5 SCM and trapezius strength  bilaterally  CN XII Midline tongue protrusion, symmetric L/R movements   Psychiatric:        Mood and Affect: Mood normal.        Behavior: Behavior normal.     ED Results / Procedures / Treatments   Labs (all labs ordered are listed, but only abnormal results are displayed) Labs Reviewed  CBC WITH DIFFERENTIAL/PLATELET - Abnormal; Notable for the following components:      Result Value   Hemoglobin 9.8 (*)    HCT 31.6 (*)    MCV 72.8 (*)    MCH 22.6 (*)    Platelets 423 (*)    All other components within normal limits  COMPREHENSIVE METABOLIC PANEL - Abnormal; Notable for the following components:   Sodium 130 (*)    Potassium 2.6 (*)    CO2 16 (*)    Glucose, Bld 103 (*)    Creatinine, Ser 1.73 (*)    GFR calc non Af Amer 39 (*)    GFR calc Af Amer 45 (*)    All other components within normal limits  MAGNESIUM  I-STAT BETA HCG BLOOD, ED ([facility], [facility], [facility] ONLY)    EKG EKG Interpretation  Date/Time:  Monday December 08 2019 09:34:15 EST Ventricular Rate:  101 PR Interval:    QRS Duration: 81 QT Interval:  350 QTC Calculation: 127 R Axis:   84 Text Interpretation: Sinus tachycardia Consider right atrial enlargement Confirmed by Quintella Reichert [phone]) on 12/08/2019 9:46:28 AM   Radiology No results found.  Procedures Procedures (including critical care time)  Medications Ordered in ED Medications  potassium chloride 10 mEq in 100 mL IVPB (10 mEq Intravenous New Bag/Given 12/08/19 0929)  lactated ringers bolus 1,000 mL (has no administration in time range)  potassium chloride 10 mEq in 100 mL IVPB (has no administration in time range)  0.9 %  sodium chloride infusion ( Intravenous Bolus from Bag 12/08/19 0822)  ondansetron (ZOFRAN) injection 4 mg (4 mg Intravenous Given 12/08/19 0826)  metoCLOPramide (REGLAN) injection 10 mg (10 mg Intravenous Given 12/08/19 0826)  diphenhydrAMINE (BENADRYL) injection 12.5 mg (12.5 mg Intravenous Given 12/08/19 0827)  potassium chloride  SA (KLOR-CON) CR tablet 40 mEq (40 mEq Oral Given 12/08/19 0935)    ED Course  I have reviewed the triage vital signs and the nursing notes.  Pertinent labs & imaging results that were available during my care of the patient were reviewed by me and considered in my medical decision making (see chart for details).  Clinical Course as of Dec 07 948  Mon Dec 08, 2019  1749 Within normal limits, potassium is 2.6.  Patient is taking potassium orally at home 40 mg 3 times a day.  She is asymptomatic from hypokalemia today.  Creatinine at baseline.  Magnesium: 2.0 [WF]  0945 Able anemia unchanged from prior  CBC with Differential(!) [WF]    Clinical Course User Index [WF] Tedd Sias, PA   MDM Rules/Calculators/A&P                       Patient 29 year old female presented today with bandlike constant headache for past 3 days.  No neurologic symptoms no other symptoms today.  Neuro exam within normal limits.  No meningismus.  Is well-appearing and afebrile.  Vitals within normal limits.  She has a history of hypokalemia likely due to her ostomy.  Potassium 2.6 today sodium 130.  Creatinine at baseline is elevated same today.  Patient has stable anemia 9.8 likely due to chronic kidney disease.  Unchanged today.  She is asymptomatic does not seem to have noticeable weakness, muscle cramps shortness of breath or chest pain.  She does endorse some fatigue but states she feels much improved after Reglan and Benadryl and 1 L normal saline states her headache is completely resolved and she feels much better but does feel slightly jittery.   Denies any changes in stools or ostomy such as increased output or liquidy output.  States that she was on 20 mg of potassium 3 times daily up until 2 months ago when she was increased to 40 mg 3 times daily.  States that her nephrologist has told her that this is just her baseline potassium and is low.  As her potassium has been low every time has been checked  for the past 2 months I doubt there is a new problem.  She is asymptomatic today.  Appears that she has been evaluated by nephrology in the past that she states that they have told her this is her baseline.  No vomiting or GI symptoms to explain.  She is not on a diuretic. Suspect due to ostomy OP.  Magnesium was within normal limits 1 month and 2 months ago.  Will redraw today.  Patient given 40 mEq p.o. and 2x10 mEq IV  potassium and 1L NS and 1L LR.  States that she did not take your potassium this morning but normally does.  Will represcribe patient's regimen of 40 equivalents of potassium 3 times daily and recommend she follow-up with her primary care doctor within the week for BMET.    The medical records were personally reviewed by myself. I personally reviewed all lab results and interpreted all imaging studies and either concurred with their official read or contacted radiology for clarification.    Final Clinical Impression(s) / ED Diagnoses Final diagnoses:  Bad headache  Dehydration  Hypokalemia   This patient appears reasonably screened and I doubt any other medical condition requiring further workup, evaluation, or treatment in the ED at this time prior to discharge.   Patient's vitals are WNL apart from vital sign abnormalities discussed above, patient is in NAD, and  able to ambulate in the ED at their baseline and able to tolerate PO.  Pain has been managed or a plan has been made for home management and has no complaints prior to discharge. Patient is comfortable with above plan and for discharge at this time. All questions were answered prior to disposition. Results from the ER workup discussed with the patient face to face and all questions answered to the best of my ability. The patient is safe for discharge with strict return precautions. Patient appears safe for discharge with appropriate follow-up. Conveyed my impression with the patient and they voiced understanding and  are agreeable to plan.   An After Visit Summary was printed and given to the patient.  Portions of this note were generated with [employer]. Dictation errors may occur despite best attempts at proofreading.   I discussed this case with my attending physician who cosigned this note including patient's presenting symptoms, physical exam, and planned diagnostics and interventions. Attending physician stated agreement with plan or made changes to plan which were implemented.   Rx / DC Orders ED Discharge Orders         Ordered    potassium chloride SA (KLOR-CON) 20 MEQ tablet  3 times daily     12/08/19 0930           Pati Gallo [place], [place] 12/08/19 0950    Quintella Reichert, MD 12/09/19 [phone]

## 2019-12-08 NOTE — ED Notes (Signed)
Pt verbalizes understanding of DC instructions. Pt belongings returned and is ambulatory out of ED.  

## 2019-12-08 NOTE — ED Triage Notes (Signed)
Pt presents with c/o migraine that started on Friday night. Pt reports associated nausea. Pt also reports light sensitivity.

## 2019-12-08 NOTE — ED Notes (Signed)
Pt ambulatory to RR independently  

## 2019-12-08 NOTE — ED Notes (Signed)
Pt ambulatory to RR without assistance

## 2019-12-08 NOTE — ED Notes (Signed)
Date and time results received:12/08/19 0909  Test: Potassium Critical Value: 2.6  Name of Provider Notified: Ova Freshwater, PA  Orders Received? Or Actions Taken? N/A

## 2019-12-16 ENCOUNTER — Emergency Department (HOSPITAL_COMMUNITY)
Admission: EM | Admit: 2019-12-16 | Discharge: 2019-12-16 | Disposition: A | Discharge status: Home / Self Care | Payer: PRIVATE HEALTH INSURANCE | Attending: Emergency Medicine | Admitting: Emergency Medicine

## 2019-12-16 ENCOUNTER — Encounter (HOSPITAL_COMMUNITY): Payer: Self-pay | Admitting: Emergency Medicine

## 2019-12-16 ENCOUNTER — Emergency Department (HOSPITAL_COMMUNITY): Payer: PRIVATE HEALTH INSURANCE

## 2019-12-16 ENCOUNTER — Other Ambulatory Visit: Payer: Self-pay

## 2019-12-16 DIAGNOSIS — E871 Hypo-osmolality and hyponatremia: Secondary | ICD-10-CM | POA: Diagnosis not present

## 2019-12-16 DIAGNOSIS — E876 Hypokalemia: Secondary | ICD-10-CM | POA: Diagnosis not present

## 2019-12-16 DIAGNOSIS — R112 Nausea with vomiting, unspecified: Secondary | ICD-10-CM | POA: Insufficient documentation

## 2019-12-16 DIAGNOSIS — Z79899 Other long term (current) drug therapy: Secondary | ICD-10-CM | POA: Diagnosis not present

## 2019-12-16 DIAGNOSIS — N182 Chronic kidney disease, stage 2 (mild): Secondary | ICD-10-CM | POA: Insufficient documentation

## 2019-12-16 DIAGNOSIS — R1084 Generalized abdominal pain: Secondary | ICD-10-CM | POA: Diagnosis not present

## 2019-12-16 DIAGNOSIS — E860 Dehydration: Secondary | ICD-10-CM | POA: Diagnosis not present

## 2019-12-16 DIAGNOSIS — R079 Chest pain, unspecified: Secondary | ICD-10-CM | POA: Diagnosis present

## 2019-12-16 DIAGNOSIS — R110 Nausea: Secondary | ICD-10-CM

## 2019-12-16 LAB — CBC
HCT: 32.5 % — ABNORMAL LOW (ref 36.0–46.0)
Hemoglobin: 10.3 g/dL — ABNORMAL LOW (ref 12.0–15.0)
MCH: 21.8 pg — ABNORMAL LOW (ref 26.0–34.0)
MCHC: 31.7 g/dL (ref 30.0–36.0)
MCV: 68.9 fL — ABNORMAL LOW (ref 80.0–100.0)
Platelets: 475 10*3/uL — ABNORMAL HIGH (ref 150–400)
RBC: 4.72 MIL/uL (ref 3.87–5.11)
RDW: 14.5 % (ref 11.5–15.5)
WBC: 12.1 10*3/uL — ABNORMAL HIGH (ref 4.0–10.5)
nRBC: 0 % (ref 0.0–0.2)

## 2019-12-16 LAB — COMPREHENSIVE METABOLIC PANEL
ALT: 10 U/L (ref 0–44)
AST: 14 U/L — ABNORMAL LOW (ref 15–41)
Albumin: 4.2 g/dL (ref 3.5–5.0)
Alkaline Phosphatase: 97 U/L (ref 38–126)
Anion gap: 12 (ref 5–15)
BUN: 23 mg/dL — ABNORMAL HIGH (ref 6–20)
CO2: 15 mmol/L — ABNORMAL LOW (ref 22–32)
Calcium: 9.1 mg/dL (ref 8.9–10.3)
Chloride: 98 mmol/L (ref 98–111)
Creatinine, Ser: 2.16 mg/dL — ABNORMAL HIGH (ref 0.44–1.00)
GFR calc Af Amer: 35 mL/min — ABNORMAL LOW (ref >60)
GFR calc non Af Amer: 30 mL/min — ABNORMAL LOW (ref >60)
Glucose, Bld: 105 mg/dL — ABNORMAL HIGH (ref 70–99)
Potassium: 2.5 mmol/L — CL (ref 3.5–5.1)
Sodium: 125 mmol/L — ABNORMAL LOW (ref 135–145)
Total Bilirubin: 0.7 mg/dL (ref 0.3–1.2)
Total Protein: 8.3 g/dL — ABNORMAL HIGH (ref 6.5–8.1)

## 2019-12-16 LAB — I-STAT BETA HCG BLOOD, ED (MC, WL, AP ONLY): I-stat hCG, quantitative: <5 m[IU]/mL (ref <5)

## 2019-12-16 LAB — LIPASE, BLOOD: Lipase: 50 U/L (ref 11–51)

## 2019-12-16 LAB — TROPONIN I (HIGH SENSITIVITY)
Troponin I (High Sensitivity): 3 ng/L (ref <18)
Troponin I (High Sensitivity): 2 ng/L (ref <18)

## 2019-12-16 LAB — MAGNESIUM: Magnesium: 2.1 mg/dL (ref 1.7–2.4)

## 2019-12-16 MED ORDER — SODIUM CHLORIDE 0.9 % IV BOLUS
1000.0000 mL | Freq: Once | INTRAVENOUS | Status: AC
  Administered 2019-12-16: 1000 mL via INTRAVENOUS

## 2019-12-16 MED ORDER — ONDANSETRON 4 MG PO TBDP: 4.0000 mg | ORAL_TABLET | Freq: Three times a day (TID) | ORAL | 0 refills | Status: DC | PRN

## 2019-12-16 MED ORDER — POTASSIUM CHLORIDE 10 MEQ/100ML IV SOLN
10.0000 meq | INTRAVENOUS | Status: AC
  Administered 2019-12-16 (×3): 10 meq via INTRAVENOUS
  Filled 2019-12-16 (×3): qty 100

## 2019-12-16 MED ORDER — FENTANYL CITRATE (PF) 100 MCG/2ML IJ SOLN
50.0000 ug | Freq: Once | INTRAMUSCULAR | Status: AC
  Administered 2019-12-16: 09:00:00 50 ug via INTRAVENOUS
  Filled 2019-12-16: qty 2

## 2019-12-16 MED ORDER — POTASSIUM CHLORIDE CRYS ER 20 MEQ PO TBCR
40.0000 meq | EXTENDED_RELEASE_TABLET | Freq: Once | ORAL | Status: AC
  Administered 2019-12-16: 40 meq via ORAL
  Filled 2019-12-16: qty 2

## 2019-12-16 MED ORDER — SODIUM CHLORIDE 0.9% FLUSH: 3.0000 mL | Freq: Once | INTRAVENOUS | Status: DC

## 2019-12-16 MED ORDER — HYDROMORPHONE HCL 1 MG/ML IJ SOLN
1.0000 mg | Freq: Once | INTRAMUSCULAR | Status: AC
  Administered 2019-12-16: 1 mg via INTRAVENOUS
  Filled 2019-12-16: qty 1

## 2019-12-16 MED ORDER — HEPARIN SOD (PORK) LOCK FLUSH 100 UNIT/ML IV SOLN
500.0000 [IU] | Freq: Once | INTRAVENOUS | Status: AC
  Administered 2019-12-16: 500 [IU]
  Filled 2019-12-16: qty 5

## 2019-12-16 MED ORDER — ONDANSETRON HCL 4 MG/2ML IJ SOLN
4.0000 mg | Freq: Once | INTRAMUSCULAR | Status: AC
  Administered 2019-12-16: 09:00:00 4 mg via INTRAVENOUS
  Filled 2019-12-16: qty 2

## 2019-12-16 NOTE — Discharge Instructions (Signed)
Please take your previously prescribed potassium supplements, use prescribed Zofran as needed for further nausea.  Return to ER if you develop worsening nausea, abdominal pain or other new concerning symptom.  Recommend calling your primary doctor to schedule appointment in 24 to 48 hours for close recheck.  Please have them recheck your sodium and potassium levels.

## 2019-12-16 NOTE — ED Triage Notes (Signed)
Pt c/o chest pains and heart palpitations that started today.

## 2019-12-16 NOTE — ED Notes (Signed)
Patient transported to X-ray via stretcher 

## 2019-12-16 NOTE — ED Provider Notes (Signed)
[place] DEPT Provider Note   CSN: [id number] Arrival date & time: 12/16/19  0731     History Chief Complaint  Patient presents with  . Chest Pain    Cindy [name] is a 29 y.o. female.  Presented with chief complaint of abdominal pain.  Patient reports that she has had multiple episodes of abdominal pain similar to this previously, last episode was in December.  States episode today started this morning around 4 AM when she got up to go to the bathroom.  States pain is all over her abdomen, worse in her upper abdomen.  Seems to radiate up to her chest.  Burning, sharp, stabbing.  10 in severity.  Has not taken any medications for this.  Has nausea without vomiting.  Has ostomy, has had mild increase in output, no change in color of stool.  Past medical history FAP, colon cancer s/p colectomy.  CKD.  HPI     Past Medical History:  Diagnosis Date  . Bilateral flank pain    due to ureteral stents  . Chronic hypokalemia   . CKD (chronic kidney disease), stage II   . 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 Complication of anesthesia    post op aspiration pneumonia w/ lap. appy 12-23-2007 (emergent ruptured appendix)  . FAP (familial adenomatous polyposis) 09/30/2014  . Heart murmur   . History of acute renal failure    2015;  2016  . History of fetal demise, not currently pregnant    03-22-2013  stillborn at 13 wks  . History of kidney stones   . History of sepsis    admitted 12-03-2016 (post-op day 2 w/ bilateral ureteral stents and stone manipluation) discharged 12-06-2016  for urosepsis  . Hypomagnesemia   . Ileostomy in place [facility])   . Iron deficiency anemia   . Nephrolithiasis    BILATERAL   . Normocytic anemia   . Renal cyst, left    PER CT  11-12-2016  . Urgency of urination     Patient Active Problem List   Diagnosis Date Noted  . Lower abdominal pain 07/13/2018  . Elevated lipase 07/13/2018  . AKI (acute kidney injury) ([place]) 09/02/2017  . Dehydration 06/07/2017  . Sepsis, unspecified organism ([place]) 12/03/2016  . 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 Normocytic anemia 05/02/2015  . Hypotension   . High output ileostomy ([place]) 09/30/2014  . FAP (familial adenomatous polyposis) 09/30/2014  . Nephrolithiasis 09/30/2014  . Adenocarcinoma of colon ([place]) 05/26/2013  . Nausea with vomiting 05/21/2013  . Microcytic anemia 04/25/2013  . Hypokalemia 04/25/2013    Past Surgical History:  Procedure Laterality Date  . COLONOSCOPY N/A 05/24/2013   Procedure: COLONOSCOPY;  Surgeon: Missy Sabins, MD;  Location: [place];  Service: Endoscopy;  Laterality: N/A;  . COMPLETION PROTECTOMY AND REVISION ILEOSTOMY/ LYSIS ADHESIONS  07-28-2014    [facility]  . CYSTOSCOPY W/ RETROGRADES Right 12/15/2016   Procedure: CYSTOSCOPY WITH RETROGRADE PYELOGRAM;  Surgeon: Alexis Frock, MD;  Location: [facility];  Service: Urology;  Laterality: Right;  . CYSTOSCOPY W/ URETERAL STENT PLACEMENT Bilateral 11/01/2016   Procedure: CYSTOSCOPY WITH BILATERAL RETROGRADE PYELOGRAM BILATERAL URETERAL STENT PLACEMENT;  Surgeon: Alexis Frock, MD;  Location: [facility];  Service: Urology;  Laterality: Bilateral;  .  CYSTOSCOPY W/ URETERAL STENT PLACEMENT Right 12/15/2016   Procedure: CYSTOSCOPY WITH STENT REPLACEMENT;  Surgeon: Alexis Frock, MD;  Location: [facility];  Service: Urology;  Laterality: Right;  . CYSTOSCOPY W/ URETERAL STENT REMOVAL Bilateral 12/15/2016   Procedure: CYSTOSCOPY WITH STENT REMOVAL;  Surgeon: Alexis Frock, MD;  Location: [facility];  Service: Urology;  Laterality: Bilateral;  . CYSTOSCOPY WITH URETEROSCOPY Bilateral 12/01/2016   Procedure: FIRST STAGE CYSTOSCOPY WITH URETEROSCOPY,   STONE MANIPULATION and BASKETRY, STENT EXCHANGE;  Surgeon: Alexis Frock, MD;  Location: [facility];  Service: Urology;  Laterality: Bilateral;  . CYSTOSCOPY WITH URETEROSCOPY Right 12/15/2016   Procedure: SECOND STAGE -CYSTOSCOPY WITH URETEROSCOPY AND STONE EXTRACTION WITH BASKET;  Surgeon: Alexis Frock, MD;  Location: [facility];  Service: Urology;  Laterality: Right;  . HOLMIUM LASER APPLICATION Bilateral 0/06/6577   Procedure: HOLMIUM LASER APPLICATION;  Surgeon: Alexis Frock, MD;  Location[facility];  Service: Urology;  Laterality: Bilateral;  . HOLMIUM LASER APPLICATION Right 4/69/6295   Procedure: HOLMIUM LASER APPLICATION;  Surgeon: Alexis Frock, MD;  Location: [facility];  Service: Urology;  Laterality: Right;  . I & D EXTREMITY Left 11/14/2013   Procedure: IRRIGATION AND DEBRIDEMENT LEFT LONG FINGER;  Surgeon: Tennis Must, MD;  Location: [facility];  Service: Orthopedics;  Laterality: Left;  . IM NAILING TIBIA Left 08/23/2010  . LAPAROSCOPIC APPENDECTOMY  12/23/2007  . LEG RECONSTRUCTION USING FASCIAL FLAP  ~ 2009  . SUBTOTAL COLECTOMY /  RESECTION ENBLOC DISTAL ILEUM/  ILEOSTOMY  07/ 2014  [facility]  . TRANSTHORACIC ECHOCARDIOGRAM  11/11/2011   ef 55-60%/  mild MR/  trivial PR and TR/  PASP 29-45mmHg/  trivial pericardial effusion identified     OB History    Gravida  1   Para  1   Term  0   Preterm  1   AB  0   Living        SAB  0   TAB  0   Ectopic  0   Multiple      Live Births              Family History  Adopted: Yes  Problem Relation Age of Onset  . ALS Mother   . Healthy Father   . Alcohol abuse Neg Hx   . Arthritis Neg Hx   . Asthma Neg Hx   . Birth defects Neg Hx   . Cancer Neg Hx   . COPD Neg Hx   . Depression Neg Hx   . Diabetes Neg Hx   . Drug abuse Neg Hx   . Early death Neg Hx   . Hearing loss Neg Hx   . Heart disease Neg Hx   . Hyperlipidemia Neg  Hx   . Hypertension Neg Hx   . Kidney disease Neg Hx   . Learning disabilities Neg Hx   . Mental illness Neg Hx   . Mental retardation Neg Hx   . Miscarriages / Stillbirths Neg Hx   . Stroke Neg Hx   . Vision loss Neg Hx     Social History   Tobacco Use  . Smoking status: Never Smoker  . Smokeless tobacco: Never Used  Substance Use Topics  . Alcohol use: No  . Drug use: No    Home Medications Prior to Admission medications   Medication Sig Start Date End Date Taking? Authorizing Provider  acetaminophen (TYLENOL) 325  MG tablet Take 650 mg by mouth every 6 (six) hours as needed for mild pain or headache.   Yes [provider]  cyanocobalamin (,VITAMIN B-12,) 1000 MCG/ML injection Inject 1,000 mcg into the muscle every 30 (thirty) days.    Yes [provider]  loperamide (IMODIUM) 1 MG/5ML solution Take 3 mg by mouth daily.    Yes [provider]  LORazepam (ATIVAN) 1 MG tablet Take 1 mg by mouth 3 (three) times daily as needed for anxiety.   Yes [provider]  magnesium oxide (MAG-OX) 400 MG tablet Take 400 mg by mouth once a week.    Yes [provider]  medroxyPROGESTERone (DEPO-PROVERA) 150 MG/ML injection Inject 150 mg into the muscle every 3 (three) months.   Yes [provider]  Multiple Vitamins-Minerals (MULTIVITAMIN GUMMIES ADULT) CHEW Chew 2 each by mouth daily.    Yes [provider]  potassium chloride (KLOR-CON) 20 MEQ packet Take 40 mEq by mouth 2 (two) times daily.   Yes [provider]  potassium chloride SA (KLOR-CON) 20 MEQ tablet Take 2 tablets (40 mEq total) by mouth 3 (three) times daily for 14 days. 12/08/19 12/22/19 Yes Tedd Sias, PA  Vitamin D, Ergocalciferol, (DRISDOL) 50000 units CAPS capsule Take 50,000 Units by mouth every Monday.  05/22/16  Yes [provider]  cetirizine (ZYRTEC) 10 MG tablet Take 1 tablet (10 mg total) by mouth daily. Patient not taking: Reported on  12/16/2019 07/09/19   Loura Halt A, NP  diclofenac sodium (VOLTAREN) 1 % GEL Apply 2 g topically 4 (four) times daily. Patient not taking: Reported on 12/16/2019 07/20/19   Petrucelli, Glynda Jaeger, PA-C  HYDROcodone-acetaminophen (NORCO/VICODIN) 5-325 MG tablet Take 1 tablet by mouth 2 times daily at 12 noon and 4 pm. Patient not taking: Reported on 09/24/2019 07/23/19   Couture, Cortni S, PA-C  ondansetron (ZOFRAN ODT) 4 MG disintegrating tablet Take 1 tablet (4 mg total) by mouth every 8 (eight) hours as needed for nausea or vomiting. 12/16/19   Lucrezia Starch, MD  potassium chloride 20 MEQ/15ML (10%) SOLN Take 20 mLs (26.6667 mEq total) by mouth 3 (three) times daily. Patient not taking: Reported on 12/16/2019 09/04/17   Bonnielee Haff, MD    Allergies    Food, Iron, Venofer [ferric oxide], Soap, Morphine and related, Sulfa antibiotics, Sulfamethoxazole-trimethoprim, and Levaquin [levofloxacin]  Review of Systems   Review of Systems  Constitutional: Negative for chills and fever.  HENT: Negative for ear pain and sore throat.   Eyes: Negative for pain and visual disturbance.  Respiratory: Negative for cough and shortness of breath.   Cardiovascular: Negative for chest pain and palpitations.  Gastrointestinal: Positive for abdominal pain and nausea. Negative for vomiting.  Genitourinary: Negative for dysuria and hematuria.  Musculoskeletal: Negative for arthralgias and back pain.  Skin: Negative for color change and rash.  Neurological: Negative for seizures and syncope.  All other systems reviewed and are negative.   Physical Exam Updated Vital Signs BP 100/63   Pulse (!) 108   Temp 97.9 F (36.6 C) (Oral)   Resp 16   Ht 5' (1.524 m)   Wt 39.3 kg   SpO2 100%   BMI 16.93 kg/m   Physical Exam Vitals and nursing note reviewed.  Constitutional:      General: She is not in acute distress.    Appearance: She is well-developed.  HENT:     Head: Normocephalic and atraumatic.    Eyes:  Conjunctiva/sclera: Conjunctivae normal.  Cardiovascular:     Rate and Rhythm: Normal rate and regular rhythm.     Heart sounds: No murmur.  Pulmonary:     Effort: Pulmonary effort is normal. No respiratory distress.     Breath sounds: Normal breath sounds.  Abdominal:     Palpations: Abdomen is soft.     Tenderness: There is no abdominal tenderness.  Musculoskeletal:     Cervical back: Neck supple.     Right lower leg: No edema.     Left lower leg: No edema.  Skin:    General: Skin is warm and dry.  Neurological:     General: No focal deficit present.     Mental Status: She is alert and oriented to person, place, and time.     ED Results / Procedures / Treatments   Labs (all labs ordered are listed, but only abnormal results are displayed) Labs Reviewed  CBC - Abnormal; Notable for the following components:      Result Value   WBC 12.1 (*)    Hemoglobin 10.3 (*)    HCT 32.5 (*)    MCV 68.9 (*)    MCH 21.8 (*)    Platelets 475 (*)    All other components within normal limits  COMPREHENSIVE METABOLIC PANEL - Abnormal; Notable for the following components:   Sodium 125 (*)    Potassium 2.5 (*)    CO2 15 (*)    Glucose, Bld 105 (*)    BUN 23 (*)    Creatinine, Ser 2.16 (*)    Total Protein 8.3 (*)    AST 14 (*)    GFR calc non Af Amer 30 (*)    GFR calc Af Amer 35 (*)    All other components within normal limits  LIPASE, BLOOD  MAGNESIUM  URINALYSIS, ROUTINE W REFLEX MICROSCOPIC  I-STAT BETA HCG BLOOD, ED ([facility], [facility], [facility] ONLY)  TROPONIN I (HIGH SENSITIVITY)  TROPONIN I (HIGH SENSITIVITY)    EKG None  Radiology CT ABDOMEN PELVIS WO CONTRAST  Result Date: 12/16/2019 CLINICAL DATA:  Abdominal pain, generalized abdominal pain history of colon cancer, status post ileostomy. EXAM: CT ABDOMEN AND PELVIS WITHOUT CONTRAST TECHNIQUE: Multidetector CT imaging of the abdomen and pelvis was performed following the standard protocol without IV contrast.  COMPARISON:  09/24/2019 FINDINGS: Lower chest: Incidental imaging of the lung bases is unremarkable. Hepatobiliary: Normal appearance of the liver on noncontrast CT. No signs of pericholecystic inflammation. No visible ductal dilation. Pancreas: Pancreas is normal without ductal dilation or peripancreatic inflammation. Spleen: Spleen is normal size without focal lesion. Adrenals/Urinary Tract: Adrenal glands are normal with respect to size and configuration. Renal assessment mildly limited secondary to respiratory motion. Tiny lower pole calculus remains present in the lower pole of the right kidney. Bifid renal pelvis on the right. Bifid renal pelvis on the left with malrotation of the left kidney and mild fullness of left intrarenal collecting systems. Punctate calculus in the upper pole the left kidney is similar to previous exams. Urinary bladder is normal No definite evidence of ureteral calculus. No signs of frank hydronephrosis. Stomach/Bowel: Postop changes related to colectomy and right lower quadrant ileostomy. 3.6 x 2.0 cm area of low attenuation seen on the prior exam in the presacral space now 3.1 x 1.8 cm. No acute gastrointestinal process. Vascular/Lymphatic: No signs of calcific atherosclerotic change or aneurysmal dilation. No signs of adenopathy. Reproductive: Noncontrast appearance of uterus is normal. Other: No signs of free air  or ascites. Musculoskeletal: No signs of acute bone finding or destructive bone process. IMPRESSION: 1. Stable small amounts of fluid in the pelvis, potentially related to postoperative changes but unchanged from prior studies, attention on follow-up. 2. Signs of colectomy and right lower quadrant ileostomy as before. 3. Nephrolithiasis without signs of ureteral calculus or hydronephrosis. Electronically Signed   By: Zetta Bills M.D.   On: 12/16/2019 10:25   DG Chest 2 View  Result Date: 12/16/2019 CLINICAL DATA:  Chest pain and cardiac palpitations EXAM: CHEST - 2  VIEW COMPARISON:  04/29/2018 FINDINGS: Cardiac shadow is within normal limits. The lungs are well aerated bilaterally. No focal infiltrate or sizable effusion is seen. Right-sided chest wall port is again noted and stable. No bony abnormality is seen IMPRESSION: No active cardiopulmonary disease. Electronically Signed   By: Inez Catalina M.D.   On: 12/16/2019 09:59    Procedures Procedures (including critical care time)  Medications Ordered in ED Medications  sodium chloride flush (NS) 0.9 % injection 3 mL (has no administration in time range)  potassium chloride 10 mEq in 100 mL IVPB (10 mEq Intravenous New Bag/Given 12/16/19 1138)  sodium chloride 0.9 % bolus 1,000 mL (0 mLs Intravenous Stopped 12/16/19 1050)  ondansetron (ZOFRAN) injection 4 mg (4 mg Intravenous Given 12/16/19 0836)  fentaNYL (SUBLIMAZE) injection 50 mcg (50 mcg Intravenous Given 12/16/19 0836)  potassium chloride SA (KLOR-CON) CR tablet 40 mEq (40 mEq Oral Given 12/16/19 1019)  HYDROmorphone (DILAUDID) injection 1 mg (1 mg Intravenous Given 12/16/19 1045)    ED Course  I have reviewed the triage vital signs and the nursing notes.  Pertinent labs & imaging results that were available during my care of the patient were reviewed by me and considered in my medical decision making (see chart for details).  Clinical Course as of Dec 15 1224  Tue Dec 16, 2019  1219 Rechecked, sitting up in bed drinking water and eating [place] sandwich without any difficulty, symptoms resolved   [RD]    Clinical Course User Index [RD] Lucrezia Starch, MD   MDM Rules/Calculators/A&P                      29 year old lady presented to ER with complaints of severe abdominal pain and nausea.  On exam she was initially noted to be tachycardic, had some tenderness on exam but otherwise was well-appearing.  CT scan was negative.  Noted dehydration, hyponatremia, hypokalemia, slight increase in creatinine from baseline.  Patient was given aggressive  hydration, potassium supplementation.  She had complete resolution of symptoms, tolerating p.o. Without any difficulty.  Her abdominal pain seems to be acute on chronic, she has had multiple prior presentations that have included hypokalemia and hyponatremia.  Believe she is appropriate for discharge and outpatient management this time.  Recommend close recheck with her primary doctor and reviewed strict return precautions.    After the discussed management above, the patient was determined to be safe for discharge.  The patient was in agreement with this plan and all questions regarding their care were answered.  ED return precautions were discussed and the patient will return to the ED with any significant worsening of condition.   Final Clinical Impression(s) / ED Diagnoses Final diagnoses:  Generalized abdominal pain  Nausea without vomiting  Hypokalemia  Dehydration  Hyponatremia    Rx / DC Orders ED Discharge Orders         Ordered    ondansetron (ZOFRAN ODT)  4 MG disintegrating tablet  Every 8 hours PRN     12/16/19 1223           Lucrezia Starch, MD 12/16/19 1226

## 2020-01-01 ENCOUNTER — Emergency Department (HOSPITAL_COMMUNITY)
Admission: EM | Admit: 2020-01-01 | Discharge: 2020-01-01 | Disposition: A | Discharge status: Home / Self Care | Payer: PRIVATE HEALTH INSURANCE | Attending: Emergency Medicine | Admitting: Emergency Medicine

## 2020-01-01 ENCOUNTER — Encounter (HOSPITAL_COMMUNITY): Payer: Self-pay | Admitting: Emergency Medicine

## 2020-01-01 ENCOUNTER — Other Ambulatory Visit: Payer: Self-pay

## 2020-01-01 DIAGNOSIS — R110 Nausea: Secondary | ICD-10-CM | POA: Diagnosis present

## 2020-01-01 DIAGNOSIS — R252 Cramp and spasm: Secondary | ICD-10-CM | POA: Diagnosis not present

## 2020-01-01 DIAGNOSIS — Z932 Ileostomy status: Secondary | ICD-10-CM | POA: Insufficient documentation

## 2020-01-01 DIAGNOSIS — Z5321 Procedure and treatment not carried out due to patient leaving prior to being seen by health care provider: Secondary | ICD-10-CM | POA: Insufficient documentation

## 2020-01-01 NOTE — ED Triage Notes (Signed)
Pt c/o nausea, abd pains and leg cramps and believes is dehydrated. Reports has ileostomy and had little output.

## 2020-01-15 ENCOUNTER — Other Ambulatory Visit: Payer: Self-pay

## 2020-01-15 ENCOUNTER — Emergency Department (HOSPITAL_COMMUNITY)
Admission: EM | Admit: 2020-01-15 | Discharge: 2020-01-15 | Disposition: A | Discharge status: Home / Self Care | Payer: PRIVATE HEALTH INSURANCE | Attending: Emergency Medicine | Admitting: Emergency Medicine

## 2020-01-15 ENCOUNTER — Encounter (HOSPITAL_COMMUNITY): Payer: Self-pay | Admitting: Emergency Medicine

## 2020-01-15 DIAGNOSIS — R1084 Generalized abdominal pain: Secondary | ICD-10-CM | POA: Diagnosis present

## 2020-01-15 DIAGNOSIS — N182 Chronic kidney disease, stage 2 (mild): Secondary | ICD-10-CM | POA: Diagnosis not present

## 2020-01-15 DIAGNOSIS — Z933 Colostomy status: Secondary | ICD-10-CM | POA: Diagnosis not present

## 2020-01-15 DIAGNOSIS — Z85038 Personal history of other malignant neoplasm of large intestine: Secondary | ICD-10-CM | POA: Diagnosis not present

## 2020-01-15 DIAGNOSIS — Z79899 Other long term (current) drug therapy: Secondary | ICD-10-CM | POA: Insufficient documentation

## 2020-01-15 DIAGNOSIS — Z9889 Other specified postprocedural states: Secondary | ICD-10-CM | POA: Diagnosis not present

## 2020-01-15 DIAGNOSIS — R197 Diarrhea, unspecified: Secondary | ICD-10-CM | POA: Insufficient documentation

## 2020-01-15 DIAGNOSIS — R1033 Periumbilical pain: Secondary | ICD-10-CM | POA: Insufficient documentation

## 2020-01-15 LAB — COMPREHENSIVE METABOLIC PANEL
ALT: 11 U/L (ref 0–44)
AST: 14 U/L — ABNORMAL LOW (ref 15–41)
Albumin: 3.3 g/dL — ABNORMAL LOW (ref 3.5–5.0)
Alkaline Phosphatase: 72 U/L (ref 38–126)
Anion gap: 10 (ref 5–15)
BUN: 25 mg/dL — ABNORMAL HIGH (ref 6–20)
CO2: 14 mmol/L — ABNORMAL LOW (ref 22–32)
Calcium: 7.7 mg/dL — ABNORMAL LOW (ref 8.9–10.3)
Chloride: 103 mmol/L (ref 98–111)
Creatinine, Ser: 2.1 mg/dL — ABNORMAL HIGH (ref 0.44–1.00)
GFR calc Af Amer: 36 mL/min — ABNORMAL LOW (ref >60)
GFR calc non Af Amer: 31 mL/min — ABNORMAL LOW (ref >60)
Glucose, Bld: 104 mg/dL — ABNORMAL HIGH (ref 70–99)
Potassium: 2.4 mmol/L — CL (ref 3.5–5.1)
Sodium: 127 mmol/L — ABNORMAL LOW (ref 135–145)
Total Bilirubin: 0.8 mg/dL (ref 0.3–1.2)
Total Protein: 6.7 g/dL (ref 6.5–8.1)

## 2020-01-15 LAB — URINALYSIS, ROUTINE W REFLEX MICROSCOPIC
Bilirubin Urine: NEGATIVE
Glucose, UA: NEGATIVE mg/dL
Ketones, ur: NEGATIVE mg/dL
Leukocytes,Ua: NEGATIVE
Nitrite: NEGATIVE
Protein, ur: NEGATIVE mg/dL
Specific Gravity, Urine: 1.004 — ABNORMAL LOW (ref 1.005–1.030)
pH: 5 (ref 5.0–8.0)

## 2020-01-15 LAB — CBC WITH DIFFERENTIAL/PLATELET
Abs Immature Granulocytes: 0.05 10*3/uL (ref 0.00–0.07)
Basophils Absolute: 0 10*3/uL (ref 0.0–0.1)
Basophils Relative: 0 %
Eosinophils Absolute: 0 10*3/uL (ref 0.0–0.5)
Eosinophils Relative: 0 %
HCT: 25.2 % — ABNORMAL LOW (ref 36.0–46.0)
Hemoglobin: 7.9 g/dL — ABNORMAL LOW (ref 12.0–15.0)
Immature Granulocytes: 1 %
Lymphocytes Relative: 19 %
Lymphs Abs: 1.8 10*3/uL (ref 0.7–4.0)
MCH: 21.8 pg — ABNORMAL LOW (ref 26.0–34.0)
MCHC: 31.3 g/dL (ref 30.0–36.0)
MCV: 69.6 fL — ABNORMAL LOW (ref 80.0–100.0)
Monocytes Absolute: 0.7 10*3/uL (ref 0.1–1.0)
Monocytes Relative: 7 %
Neutro Abs: 6.9 10*3/uL (ref 1.7–7.7)
Neutrophils Relative %: 73 %
Platelets: 370 10*3/uL (ref 150–400)
RBC: 3.62 MIL/uL — ABNORMAL LOW (ref 3.87–5.11)
RDW: 14.9 % (ref 11.5–15.5)
WBC: 9.5 10*3/uL (ref 4.0–10.5)
nRBC: 0 % (ref 0.0–0.2)

## 2020-01-15 LAB — I-STAT BETA HCG BLOOD, ED (MC, WL, AP ONLY): I-stat hCG, quantitative: <5 m[IU]/mL (ref <5)

## 2020-01-15 LAB — LIPASE, BLOOD: Lipase: 82 U/L — ABNORMAL HIGH (ref 11–51)

## 2020-01-15 MED ORDER — HEPARIN SOD (PORK) LOCK FLUSH 100 UNIT/ML IV SOLN
500.0000 [IU] | Freq: Once | INTRAVENOUS | Status: AC
  Administered 2020-01-15: 500 [IU]
  Filled 2020-01-15: qty 5

## 2020-01-15 MED ORDER — POTASSIUM CHLORIDE CRYS ER 20 MEQ PO TBCR
40.0000 meq | EXTENDED_RELEASE_TABLET | Freq: Once | ORAL | Status: DC
  Filled 2020-01-15: qty 2

## 2020-01-15 MED ORDER — SODIUM CHLORIDE 0.9 % IV BOLUS
1000.0000 mL | Freq: Once | INTRAVENOUS | Status: AC
  Administered 2020-01-15: 1000 mL via INTRAVENOUS

## 2020-01-15 MED ORDER — POTASSIUM CHLORIDE 10 MEQ/100ML IV SOLN
10.0000 meq | Freq: Once | INTRAVENOUS | Status: AC
  Administered 2020-01-15: 10 meq via INTRAVENOUS
  Filled 2020-01-15: qty 100

## 2020-01-15 MED ORDER — ONDANSETRON HCL 4 MG/2ML IJ SOLN
4.0000 mg | Freq: Once | INTRAMUSCULAR | Status: AC
  Administered 2020-01-15: 4 mg via INTRAVENOUS
  Filled 2020-01-15: qty 2

## 2020-01-15 MED ORDER — HYDROMORPHONE HCL 1 MG/ML IJ SOLN
1.0000 mg | Freq: Once | INTRAMUSCULAR | Status: AC
  Administered 2020-01-15: 1 mg via INTRAVENOUS
  Filled 2020-01-15: qty 1

## 2020-01-15 NOTE — ED Notes (Signed)
Pt IV med still running changed status to in process.

## 2020-01-15 NOTE — ED Notes (Signed)
Date and time results received: 01/15/20 9:46 AM  (use smartphrase ".now" to insert current time)  Test: Potassium  Critical Value: 2.4  Name of Provider Notified: [facility] EDP   Orders Received? Or Actions Taken?:
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
## 2020-01-15 NOTE — ED Notes (Signed)
Provided pt with cup of soup and crackers

## 2020-01-15 NOTE — ED Triage Notes (Signed)
Per [facility] pt from home for abd pains and increased output in her colostomy. Pt given Zofran 4mg ,1000 ml NS and Fentanyl 125mcg in route. 18g left forearm.

## 2020-01-15 NOTE — ED Notes (Signed)
Pt verbalizes understanding of DC instructions. Pt belongings returned and is ambulatory out of ED.  

## 2020-01-15 NOTE — ED Notes (Signed)
ED Provider at bedside. 

## 2020-01-15 NOTE — Discharge Instructions (Addendum)
Continue medications as previously prescribed.  Follow-up with your primary doctor if symptoms or not improving in the next few days. 

## 2020-01-15 NOTE — ED Notes (Signed)
Unable to get blood off IV line

## 2020-01-15 NOTE — ED Notes (Signed)
[place] accessed. Pt verbalizes understanding of DC instructions. Pt belongings returned and is ambulatory out of ED.

## 2020-01-25 ENCOUNTER — Encounter (HOSPITAL_COMMUNITY): Payer: Self-pay

## 2020-01-25 ENCOUNTER — Other Ambulatory Visit: Payer: Self-pay

## 2020-01-25 ENCOUNTER — Emergency Department (HOSPITAL_COMMUNITY)
Admission: EM | Admit: 2020-01-25 | Discharge: 2020-01-26 | Disposition: A | Discharge status: Home / Self Care | Payer: PRIVATE HEALTH INSURANCE | Attending: Emergency Medicine | Admitting: Emergency Medicine

## 2020-01-25 DIAGNOSIS — N182 Chronic kidney disease, stage 2 (mild): Secondary | ICD-10-CM | POA: Insufficient documentation

## 2020-01-25 DIAGNOSIS — R1084 Generalized abdominal pain: Secondary | ICD-10-CM

## 2020-01-25 DIAGNOSIS — Z85038 Personal history of other malignant neoplasm of large intestine: Secondary | ICD-10-CM | POA: Insufficient documentation

## 2020-01-25 DIAGNOSIS — Z87442 Personal history of urinary calculi: Secondary | ICD-10-CM | POA: Insufficient documentation

## 2020-01-25 DIAGNOSIS — R110 Nausea: Secondary | ICD-10-CM | POA: Diagnosis not present

## 2020-01-25 DIAGNOSIS — Z932 Ileostomy status: Secondary | ICD-10-CM | POA: Diagnosis not present

## 2020-01-25 DIAGNOSIS — E876 Hypokalemia: Secondary | ICD-10-CM | POA: Insufficient documentation

## 2020-01-25 LAB — CBC
HCT: 29.7 % — ABNORMAL LOW (ref 36.0–46.0)
Hemoglobin: 9.1 g/dL — ABNORMAL LOW (ref 12.0–15.0)
MCH: 21.5 pg — ABNORMAL LOW (ref 26.0–34.0)
MCHC: 30.6 g/dL (ref 30.0–36.0)
MCV: 70.2 fL — ABNORMAL LOW (ref 80.0–100.0)
Platelets: 460 10*3/uL — ABNORMAL HIGH (ref 150–400)
RBC: 4.23 MIL/uL (ref 3.87–5.11)
RDW: 15.3 % (ref 11.5–15.5)
WBC: 12.4 10*3/uL — ABNORMAL HIGH (ref 4.0–10.5)
nRBC: 0 % (ref 0.0–0.2)

## 2020-01-25 LAB — COMPREHENSIVE METABOLIC PANEL
ALT: 12 U/L (ref 0–44)
AST: 18 U/L (ref 15–41)
Albumin: 4.1 g/dL (ref 3.5–5.0)
Alkaline Phosphatase: 92 U/L (ref 38–126)
Anion gap: 12 (ref 5–15)
BUN: 22 mg/dL — ABNORMAL HIGH (ref 6–20)
CO2: 15 mmol/L — ABNORMAL LOW (ref 22–32)
Calcium: 8.9 mg/dL (ref 8.9–10.3)
Chloride: 99 mmol/L (ref 98–111)
Creatinine, Ser: 1.91 mg/dL — ABNORMAL HIGH (ref 0.44–1.00)
GFR calc Af Amer: 40 mL/min — ABNORMAL LOW (ref >60)
GFR calc non Af Amer: 35 mL/min — ABNORMAL LOW (ref >60)
Glucose, Bld: 107 mg/dL — ABNORMAL HIGH (ref 70–99)
Potassium: 2.2 mmol/L — CL (ref 3.5–5.1)
Sodium: 126 mmol/L — ABNORMAL LOW (ref 135–145)
Total Bilirubin: 0.4 mg/dL (ref 0.3–1.2)
Total Protein: 8.1 g/dL (ref 6.5–8.1)

## 2020-01-25 LAB — I-STAT BETA HCG BLOOD, ED (MC, WL, AP ONLY): I-stat hCG, quantitative: <5 m[IU]/mL (ref <5)

## 2020-01-25 LAB — LIPASE, BLOOD: Lipase: 67 U/L — ABNORMAL HIGH (ref 11–51)

## 2020-01-25 MED ORDER — SODIUM CHLORIDE 0.9% FLUSH
3.0000 mL | Freq: Once | INTRAVENOUS | Status: AC
  Administered 2020-01-25: 3 mL via INTRAVENOUS

## 2020-01-25 NOTE — ED Notes (Signed)
Patient yelling in room for help. When this nurse arrived patient is upset asking how much longer it will be stating "I have been in pain the last two hours since I have been here" This nurse asked if I can reposition her or provide her with anything until the EDP comes to see her, patient declined.

## 2020-01-25 NOTE — ED Notes (Signed)
Date and time results received: 01/25/20 2320   Test: Potassium Critical Value: 2.2  Name of Provider Notified: Jonathon Jordan. PA  Orders Received? Or Actions Taken?:

## 2020-01-25 NOTE — ED Triage Notes (Signed)
Pt reports lower abdominal pain that started this morning when she woke up. She reports increased output from her ileostomy bag. Endorses nausea, denies vomiting. A&Ox4.

## 2020-01-26 ENCOUNTER — Encounter (HOSPITAL_COMMUNITY): Payer: Self-pay | Admitting: Emergency Medicine

## 2020-01-26 LAB — URINALYSIS, ROUTINE W REFLEX MICROSCOPIC
Bilirubin Urine: NEGATIVE
Glucose, UA: NEGATIVE mg/dL
Hgb urine dipstick: NEGATIVE
Ketones, ur: NEGATIVE mg/dL
Nitrite: NEGATIVE
Protein, ur: NEGATIVE mg/dL
Specific Gravity, Urine: 1.009 (ref 1.005–1.030)
pH: 6 (ref 5.0–8.0)

## 2020-01-26 LAB — POTASSIUM: Potassium: 2.6 mmol/L — CL (ref 3.5–5.1)

## 2020-01-26 MED ORDER — SODIUM CHLORIDE 0.9 % IV BOLUS
1000.0000 mL | Freq: Once | INTRAVENOUS | Status: AC
  Administered 2020-01-26: 1000 mL via INTRAVENOUS

## 2020-01-26 MED ORDER — MAGNESIUM SULFATE 2 GM/50ML IV SOLN
2.0000 g | Freq: Once | INTRAVENOUS | Status: AC
  Administered 2020-01-26: 2 g via INTRAVENOUS
  Filled 2020-01-26: qty 50

## 2020-01-26 MED ORDER — MAGNESIUM SULFATE 50 % IJ SOLN: 2.0000 g | Freq: Once | INTRAMUSCULAR | Status: DC

## 2020-01-26 MED ORDER — POTASSIUM CHLORIDE 10 MEQ/100ML IV SOLN
10.0000 meq | INTRAVENOUS | Status: AC
  Administered 2020-01-26 (×2): 10 meq via INTRAVENOUS
  Filled 2020-01-26 (×2): qty 100

## 2020-01-26 MED ORDER — HYDROMORPHONE HCL 1 MG/ML IJ SOLN
0.5000 mg | Freq: Once | INTRAMUSCULAR | Status: AC
  Administered 2020-01-26: 0.5 mg via INTRAVENOUS
  Filled 2020-01-26: qty 1

## 2020-01-26 MED ORDER — HEPARIN SOD (PORK) LOCK FLUSH 100 UNIT/ML IV SOLN
500.0000 [IU] | Freq: Once | INTRAVENOUS | Status: AC
  Administered 2020-01-26: 500 [IU]
  Filled 2020-01-26: qty 5

## 2020-01-26 MED ORDER — ONDANSETRON 4 MG PO TBDP: 4.0000 mg | ORAL_TABLET | Freq: Three times a day (TID) | ORAL | 0 refills | Status: DC | PRN

## 2020-01-26 MED ORDER — HYOSCYAMINE SULFATE 0.5 MG/ML IJ SOLN
0.1250 mg | Freq: Once | INTRAMUSCULAR | Status: AC
  Administered 2020-01-26: 0.125 mg via INTRAVENOUS
  Filled 2020-01-26: qty 0.25

## 2020-01-26 NOTE — ED Provider Notes (Signed)
[facility] DEPT Provider Note   CSN: [id number] Arrival date & time: 01/25/20  2129     History Chief Complaint  Patient presents with  . Abdominal Pain    Cindy [name] is a 29 y.o. female.  Patient to ED with generalized abdominal pain and increased ileostomy output since this morning. No bloody stools. She reports nausea without vomiting. No fever. She reports she experiences similar symptoms recurrently. History of colon CA with successful resection in 2014. No urinary symptoms.   The history is provided by the patient. No language interpreter was used.       Past Medical History:  Diagnosis Date  . Bilateral flank pain    due to ureteral stents  . Chronic hypokalemia   . CKD (chronic kidney disease), stage II   . 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 Complication of anesthesia    post op aspiration pneumonia w/ lap. appy 12-23-2007 (emergent ruptured appendix)  . FAP (familial adenomatous polyposis) 09/30/2014  . Heart murmur   . History of acute renal failure    2015;  2016  . History of fetal demise, not currently pregnant    03-22-2013  stillborn at 47 wks  . History of kidney stones   . History of sepsis    admitted 12-03-2016 (post-op day 2 w/ bilateral ureteral stents and stone manipluation) discharged 12-06-2016  for urosepsis  . Hypomagnesemia   . Ileostomy in place [facility])   . Iron deficiency anemia   . Nephrolithiasis    BILATERAL   . Normocytic anemia   . Renal cyst, left    PER CT 11-12-2016  . Urgency of urination     Patient Active Problem List   Diagnosis Date Noted  . Lower abdominal pain 07/13/2018  . Elevated lipase 07/13/2018  . AKI (acute kidney injury) ([place]) 09/02/2017  . Dehydration 06/07/2017  . Sepsis,  unspecified organism ([place]) 12/03/2016  . 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 Normocytic anemia 05/02/2015  . Hypotension   . 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 FAP (familial adenomatous polyposis) 09/30/2014  . Nephrolithiasis 09/30/2014  . Adenocarcinoma of colon ([place]) 05/26/2013  . Nausea with vomiting 05/21/2013  . Microcytic anemia 04/25/2013  . Hypokalemia 04/25/2013    Past Surgical History:  Procedure Laterality Date  . COLONOSCOPY N/A 05/24/2013   Procedure: COLONOSCOPY;  Surgeon: Missy Sabins, MD;  Location: [facility];  Service: Endoscopy;  Laterality: N/A;  . COMPLETION PROTECTOMY AND REVISION ILEOSTOMY/ LYSIS ADHESIONS  07-28-2014    [facility]  . CYSTOSCOPY W/ RETROGRADES Right 12/15/2016   Procedure: CYSTOSCOPY WITH RETROGRADE PYELOGRAM;  Surgeon: Alexis Frock, MD;  Location: [facility];  Service: Urology;  Laterality: Right;  . CYSTOSCOPY W/ URETERAL STENT PLACEMENT Bilateral 11/01/2016   Procedure: CYSTOSCOPY WITH BILATERAL RETROGRADE PYELOGRAM BILATERAL URETERAL STENT PLACEMENT;  Surgeon: Alexis Frock, MD;  Location: [facility];  Service: Urology;  Laterality: Bilateral;  . CYSTOSCOPY W/ URETERAL STENT PLACEMENT Right 12/15/2016   Procedure: CYSTOSCOPY WITH STENT REPLACEMENT;  Surgeon: Alexis Frock, MD;  Location: [facility];  Service: Urology;  Laterality: Right;  . CYSTOSCOPY W/ URETERAL STENT REMOVAL Bilateral 12/15/2016   Procedure: CYSTOSCOPY WITH STENT REMOVAL;  Surgeon: Alexis Frock, MD;  Location: [facility];  Service: Urology;  Laterality: Bilateral;  . CYSTOSCOPY WITH URETEROSCOPY Bilateral 12/01/2016   Procedure: FIRST STAGE CYSTOSCOPY WITH URETEROSCOPY,  STONE MANIPULATION and BASKETRY, STENT EXCHANGE;  Surgeon: Alexis Frock, MD;  Location: [facility];  Service: Urology;  Laterality: Bilateral;  . CYSTOSCOPY WITH URETEROSCOPY Right 12/15/2016   Procedure: SECOND STAGE  -CYSTOSCOPY WITH URETEROSCOPY AND STONE EXTRACTION WITH BASKET;  Surgeon: Alexis Frock, MD;  Location: [facility];  Service: Urology;  Laterality: Right;  . HOLMIUM LASER APPLICATION Bilateral 11/27/9145   Procedure: HOLMIUM LASER APPLICATION;  Surgeon: Alexis Frock, MD;  Location: [facility];  Service: Urology;  Laterality: Bilateral;  . HOLMIUM LASER APPLICATION Right 07/25/5620   Procedure: HOLMIUM LASER APPLICATION;  Surgeon: Alexis Frock, MD;  Location: [facility];  Service: Urology;  Laterality: Right;  . I & D EXTREMITY Left 11/14/2013   Procedure: IRRIGATION AND DEBRIDEMENT LEFT LONG FINGER;  Surgeon: Tennis Must, MD;  Location: [place];  Service: Orthopedics;  Laterality: Left;  . IM NAILING TIBIA Left 08/23/2010  . LAPAROSCOPIC APPENDECTOMY  12/23/2007  . LEG RECONSTRUCTION USING FASCIAL FLAP  ~ 2009  . SUBTOTAL COLECTOMY /  RESECTION ENBLOC DISTAL ILEUM/  ILEOSTOMY  07/ 2014  [facility]  . TRANSTHORACIC ECHOCARDIOGRAM  11/11/2011   ef 55-60%/  mild MR/  trivial PR and TR/  PASP 29-38mmHg/  trivial pericardial effusion identified     OB History    Gravida  1   Para  1   Term  0   Preterm  1   AB  0   Living        SAB  0   TAB  0   Ectopic  0   Multiple      Live Births              Family History  Adopted: Yes  Problem Relation Age of Onset  . ALS Mother   . Healthy Father   . Alcohol abuse Neg Hx   . Arthritis Neg Hx   . Asthma Neg Hx   . Birth defects Neg Hx   . Cancer Neg Hx   . COPD Neg Hx   . Depression Neg Hx   . Diabetes Neg Hx   . Drug abuse Neg Hx   . Early death Neg Hx   . Hearing loss Neg Hx   . Heart disease Neg Hx   . Hyperlipidemia Neg Hx   . Hypertension Neg Hx   . Kidney disease Neg Hx   . Learning disabilities Neg Hx   . Mental illness Neg Hx   . Mental retardation Neg Hx   . Miscarriages / Stillbirths Neg Hx   . Stroke Neg Hx   . Vision loss Neg Hx      Social History   Tobacco Use  . Smoking status: Never Smoker  . Smokeless tobacco: Never Used  Substance Use Topics  . Alcohol use: No  . Drug use: No    Home Medications Prior to Admission medications   Medication Sig Start Date End Date Taking? Authorizing Provider  acetaminophen (TYLENOL) 325 MG tablet Take 650 mg by mouth every 6 (six) hours as needed for mild pain or headache.    [provider]  cetirizine (ZYRTEC) 10 MG tablet Take 1 tablet (10 mg total) by mouth daily. Patient not taking: Reported on 12/16/2019 07/09/19   Loura Halt A, NP  cyanocobalamin (,VITAMIN B-12,) 1000 MCG/ML injection  Inject 1,000 mcg into the muscle every 30 (thirty) days.     [provider]  diclofenac sodium (VOLTAREN) 1 % GEL Apply 2 g topically 4 (four) times daily. Patient not taking: Reported on 12/16/2019 07/20/19   Petrucelli, Glynda Jaeger, PA-C  HYDROcodone-acetaminophen (NORCO/VICODIN) 5-325 MG tablet Take 1 tablet by mouth 2 times daily at 12 noon and 4 pm. Patient not taking: Reported on 09/24/2019 07/23/19   Couture, Cortni S, PA-C  loperamide (IMODIUM) 1 MG/5ML solution Take 3 mg by mouth daily.     [provider]  LORazepam (ATIVAN) 1 MG tablet Take 1 mg by mouth 3 (three) times daily as needed for anxiety.    [provider]  magnesium oxide (MAG-OX) 400 MG tablet Take 400 mg by mouth once a week.     [provider]  medroxyPROGESTERone (DEPO-PROVERA) 150 MG/ML injection Inject 150 mg into the muscle every 3 (three) months.    [provider]  Multiple Vitamins-Minerals (MULTIVITAMIN GUMMIES ADULT) CHEW Chew 2 each by mouth daily.     [provider]  ondansetron (ZOFRAN ODT) 4 MG disintegrating tablet Take 1 tablet (4 mg total) by mouth every 8 (eight) hours as needed for nausea or vomiting. 12/16/19   Lucrezia Starch, MD  potassium chloride 20 MEQ/15ML (10%) SOLN Take 20 mLs (26.6667 mEq total) by mouth 3 (three) times  daily. 09/04/17   Bonnielee Haff, MD  potassium chloride SA (KLOR-CON) 20 MEQ tablet Take 2 tablets (40 mEq total) by mouth 3 (three) times daily for 14 days. 12/08/19 01/15/20  Tedd Sias, PA  Vitamin D, Ergocalciferol, (DRISDOL) 50000 units CAPS capsule Take 50,000 Units by mouth every Monday.  05/22/16   [provider]    Allergies    Food, Iron, Venofer [ferric oxide], Soap, Morphine and related, Sulfa antibiotics, Sulfamethoxazole-trimethoprim, and Levaquin [levofloxacin]  Review of Systems   Review of Systems  Constitutional: Negative for chills and fever.  Respiratory: Negative.   Cardiovascular: Negative.   Gastrointestinal: Positive for abdominal pain, diarrhea and nausea. Negative for vomiting.  Genitourinary: Positive for decreased urine volume. Negative for dysuria.  Musculoskeletal: Negative.   Skin: Negative.   Neurological: Negative.     Physical Exam Updated Vital Signs BP 98/65   Pulse 98   Temp 98.1 F (36.7 C) (Oral)   Resp 16   Ht 5' (1.524 m)   Wt 42.2 kg   SpO2 100%   BMI 18.16 kg/m   Physical Exam Vitals and nursing note reviewed.  Constitutional:      Appearance: She is well-developed.  HENT:     Head: Normocephalic.  Cardiovascular:     Rate and Rhythm: Normal rate and regular rhythm.  Pulmonary:     Effort: Pulmonary effort is normal.     Breath sounds: Normal breath sounds.  Abdominal:     General: Bowel sounds are normal.     Palpations: Abdomen is soft.     Tenderness: There is generalized abdominal tenderness. There is guarding. There is no rebound.     Comments: Ileostomy in RLQ with copious stool. No hematochezia.   Musculoskeletal:        General: Normal range of motion.     Cervical back: Normal range of motion and neck supple.  Skin:    General: Skin is warm and dry.     Findings: No rash.  Neurological:     Mental Status: She is alert.     Cranial Nerves: No cranial  nerve deficit.     ED Results / Procedures  / Treatments   Labs (all labs ordered are listed, but only abnormal results are displayed) Labs Reviewed  LIPASE, BLOOD - Abnormal; Notable for the following components:      Result Value   Lipase 67 (*)    All other components within normal limits  COMPREHENSIVE METABOLIC PANEL - Abnormal; Notable for the following components:   Sodium 126 (*)    Potassium 2.2 (*)    CO2 15 (*)    Glucose, Bld 107 (*)    BUN 22 (*)    Creatinine, Ser 1.91 (*)    GFR calc non Af Amer 35 (*)    GFR calc Af Amer 40 (*)    All other components within normal limits  CBC - Abnormal; Notable for the following components:   WBC 12.4 (*)    Hemoglobin 9.1 (*)    HCT 29.7 (*)    MCV 70.2 (*)    MCH 21.5 (*)    Platelets 460 (*)    All other components within normal limits  URINALYSIS, ROUTINE W REFLEX MICROSCOPIC  I-STAT BETA HCG BLOOD, ED ([facility], [facility], [facility] ONLY)   Results for orders placed or performed during the hospital encounter of 01/25/20  Lipase, blood  Result Value Ref Range   Lipase 67 (H) 11 - 51 U/L  Comprehensive metabolic panel  Result Value Ref Range   Sodium 126 (L) 135 - 145 mmol/L   Potassium 2.2 (LL) 3.5 - 5.1 mmol/L   Chloride 99 98 - 111 mmol/L   CO2 15 (L) 22 - 32 mmol/L   Glucose, Bld 107 (H) 70 - 99 mg/dL   BUN 22 (H) 6 - 20 mg/dL   Creatinine, Ser 1.91 (H) 0.44 - 1.00 mg/dL   Calcium 8.9 8.9 - 10.3 mg/dL   Total Protein 8.1 6.5 - 8.1 g/dL   Albumin 4.1 3.5 - 5.0 g/dL   AST 18 15 - 41 U/L   ALT 12 0 - 44 U/L   Alkaline Phosphatase 92 38 - 126 U/L   Total Bilirubin 0.4 0.3 - 1.2 mg/dL   GFR calc non Af Amer 35 (L) >60 mL/min   GFR calc Af Amer 40 (L) >60 mL/min   Anion gap 12 5 - 15  CBC  Result Value Ref Range   WBC 12.4 (H) 4.0 - 10.5 K/uL   RBC 4.23 3.87 - 5.11 MIL/uL   Hemoglobin 9.1 (L) 12.0 - 15.0 g/dL   HCT 29.7 (L) 36.0 - 46.0 %   MCV 70.2 (L) 80.0 - 100.0 fL   MCH 21.5 (L) 26.0 - 34.0 pg   MCHC 30.6 30.0 - 36.0 g/dL   RDW 15.3 11.5 - 15.5 %   Platelets 460  (H) 150 - 400 K/uL   nRBC 0.0 0.0 - 0.2 %  Urinalysis, Routine w reflex microscopic  Result Value Ref Range   Color, Urine YELLOW YELLOW   APPearance CLEAR CLEAR   Specific Gravity, Urine 1.009 1.005 - 1.030   pH 6.0 5.0 - 8.0   Glucose, UA NEGATIVE NEGATIVE mg/dL   Hgb urine dipstick NEGATIVE NEGATIVE   Bilirubin Urine NEGATIVE NEGATIVE   Ketones, ur NEGATIVE NEGATIVE mg/dL   Protein, ur NEGATIVE NEGATIVE mg/dL   Nitrite NEGATIVE NEGATIVE   Leukocytes,Ua MODERATE (A) NEGATIVE   RBC / HPF 0-5 0 - 5 RBC/hpf   WBC, UA 6-10 0 - 5 WBC/hpf   Bacteria, UA RARE (A) NONE  SEEN   Squamous Epithelial / LPF 0-5 0 - 5   Mucus PRESENT    Sperm, UA PRESENT   Potassium  Result Value Ref Range   Potassium 2.6 (LL) 3.5 - 5.1 mmol/L  I-Stat beta hCG blood, ED  Result Value Ref Range   I-stat hCG, quantitative <5.0 <5 mIU/mL   Comment 3            EKG None  Radiology No results found.  Procedures Procedures (including critical care time) CRITICAL CARE Performed by: Dewaine Oats   Total critical care time: 45 minutes  Critical care time was exclusive of separately billable procedures and treating other patients.  Critical care was necessary to treat or prevent imminent or life-threatening deterioration.  Critical care was time spent personally by me on the following activities: development of treatment plan with patient and/or surrogate as well as nursing, discussions with consultants, evaluation of patient's response to treatment, examination of patient, obtaining history from patient or surrogate, ordering and performing treatments and interventions, ordering and review of laboratory studies, ordering and review of radiographic studies, pulse oximetry and re-evaluation of patient's condition.  Medications Ordered in ED Medications  sodium chloride 0.9 % bolus 1,000 mL (has no administration in time range)  HYDROmorphone (DILAUDID) injection 0.5 mg (has no administration in time  range)  potassium chloride 10 mEq in 100 mL IVPB (has no administration in time range)  hyoscyamine (LEVSIN) 0.5 MG/ML injection 0.125 mg (has no administration in time range)  sodium chloride flush (NS) 0.9 % injection 3 mL (3 mLs Intravenous Given 01/25/20 2242)    ED Course  I have reviewed the triage vital signs and the nursing notes.  Pertinent labs & imaging results that were available during my care of the patient were reviewed by me and considered in my medical decision making (see chart for details).    MDM Rules/Calculators/A&P                      Patient to ED with abdominal pain, high ileostomy output. No fever or vomiting. No bloody stools.   She is uncomfortable appearing. IV established. Will provide fluids, symptomatic treatment and reassess.  She has profound hypokalemia at 2.2. The patient reports she has chronic low potassium secondary to her PMH and takes supplements at home. She reports her baseline to be around 3.0. Multiple runs of IV K+ started. Plan to repeat after completion. No acute EKG changes. She is also provide with 2 gm magnesium.   Symptoms of pain and nausea improve. She is tolerating PO fluids without further nausea. Pain is controlled.   Repeat potassium is 2.6. She reports feeling much better and is comfortable with discharge home. Do not feel imaging is indicated at this time, however, return precautions discussed. She is advised to have close PCP follow up.  Final Clinical Impression(s) / ED Diagnoses Final diagnoses:  None   1. Abdominal pain 2. Hypokalemia   Rx / DC Orders ED Discharge Orders    None       Dennie Bible 01/27/20 6754    Palumbo, April, MD 02/04/20 2306

## 2020-01-26 NOTE — ED Notes (Signed)
Patient given chicken noodle soup and crackers per request

## 2020-01-26 NOTE — ED Notes (Signed)
Date and time results received: 01/26/20 4:22 AM  (use smartphrase ".now" to insert current time)  Test: Potassium Critical Value: 2.6  Name of Provider Notified: Dr.Palumbo  Orders Received? Or Actions Taken?

## 2020-01-26 NOTE — ED Notes (Signed)
PA Shari at bedside

## 2020-01-26 NOTE — Discharge Instructions (Addendum)
Follow up with your doctor for recheck in 1 week. Continue your regular medications as prescribed. Zofran for nausea as directed.   Return to the ED with any new or worsening symptoms.

## 2020-02-09 ENCOUNTER — Other Ambulatory Visit: Payer: Self-pay

## 2020-02-09 ENCOUNTER — Emergency Department (HOSPITAL_COMMUNITY): Payer: PRIVATE HEALTH INSURANCE

## 2020-02-09 ENCOUNTER — Emergency Department (HOSPITAL_COMMUNITY)
Admission: EM | Admit: 2020-02-09 | Discharge: 2020-02-09 | Disposition: A | Discharge status: Home / Self Care | Payer: PRIVATE HEALTH INSURANCE | Attending: Emergency Medicine | Admitting: Emergency Medicine

## 2020-02-09 ENCOUNTER — Encounter (HOSPITAL_COMMUNITY): Payer: Self-pay

## 2020-02-09 DIAGNOSIS — Z79899 Other long term (current) drug therapy: Secondary | ICD-10-CM | POA: Insufficient documentation

## 2020-02-09 DIAGNOSIS — R197 Diarrhea, unspecified: Secondary | ICD-10-CM | POA: Diagnosis present

## 2020-02-09 DIAGNOSIS — E876 Hypokalemia: Secondary | ICD-10-CM

## 2020-02-09 DIAGNOSIS — Z932 Ileostomy status: Secondary | ICD-10-CM | POA: Insufficient documentation

## 2020-02-09 DIAGNOSIS — N182 Chronic kidney disease, stage 2 (mild): Secondary | ICD-10-CM | POA: Diagnosis not present

## 2020-02-09 DIAGNOSIS — N189 Chronic kidney disease, unspecified: Secondary | ICD-10-CM

## 2020-02-09 LAB — COMPREHENSIVE METABOLIC PANEL
ALT: 13 U/L (ref 0–44)
AST: 17 U/L (ref 15–41)
Albumin: 4.5 g/dL (ref 3.5–5.0)
Alkaline Phosphatase: 94 U/L (ref 38–126)
Anion gap: 15 (ref 5–15)
BUN: 28 mg/dL — ABNORMAL HIGH (ref 6–20)
CO2: 13 mmol/L — ABNORMAL LOW (ref 22–32)
Calcium: 9.2 mg/dL (ref 8.9–10.3)
Chloride: 98 mmol/L (ref 98–111)
Creatinine, Ser: 2.57 mg/dL — ABNORMAL HIGH (ref 0.44–1.00)
GFR calc Af Amer: 28 mL/min — ABNORMAL LOW (ref >60)
GFR calc non Af Amer: 24 mL/min — ABNORMAL LOW (ref >60)
Glucose, Bld: 106 mg/dL — ABNORMAL HIGH (ref 70–99)
Potassium: 2.5 mmol/L — CL (ref 3.5–5.1)
Sodium: 126 mmol/L — ABNORMAL LOW (ref 135–145)
Total Bilirubin: 0.5 mg/dL (ref 0.3–1.2)
Total Protein: 8.7 g/dL — ABNORMAL HIGH (ref 6.5–8.1)

## 2020-02-09 LAB — CBC
HCT: 32.1 % — ABNORMAL LOW (ref 36.0–46.0)
Hemoglobin: 9.8 g/dL — ABNORMAL LOW (ref 12.0–15.0)
MCH: 21.4 pg — ABNORMAL LOW (ref 26.0–34.0)
MCHC: 30.5 g/dL (ref 30.0–36.0)
MCV: 70.2 fL — ABNORMAL LOW (ref 80.0–100.0)
Platelets: 504 10*3/uL — ABNORMAL HIGH (ref 150–400)
RBC: 4.57 MIL/uL (ref 3.87–5.11)
RDW: 16.4 % — ABNORMAL HIGH (ref 11.5–15.5)
WBC: 15.4 10*3/uL — ABNORMAL HIGH (ref 4.0–10.5)
nRBC: 0 % (ref 0.0–0.2)

## 2020-02-09 LAB — I-STAT BETA HCG BLOOD, ED (MC, WL, AP ONLY): I-stat hCG, quantitative: <5 m[IU]/mL (ref <5)

## 2020-02-09 LAB — LIPASE, BLOOD: Lipase: 65 U/L — ABNORMAL HIGH (ref 11–51)

## 2020-02-09 LAB — MAGNESIUM: Magnesium: 2.3 mg/dL (ref 1.7–2.4)

## 2020-02-09 MED ORDER — HEPARIN SOD (PORK) LOCK FLUSH 100 UNIT/ML IV SOLN
500.0000 [IU] | Freq: Once | INTRAVENOUS | Status: AC
  Administered 2020-02-09: 500 [IU]
  Filled 2020-02-09: qty 5

## 2020-02-09 MED ORDER — HYDROMORPHONE HCL 1 MG/ML IJ SOLN
0.5000 mg | Freq: Once | INTRAMUSCULAR | Status: AC
  Administered 2020-02-09: 0.5 mg via INTRAVENOUS
  Filled 2020-02-09: qty 1

## 2020-02-09 MED ORDER — POTASSIUM CHLORIDE 10 MEQ/100ML IV SOLN
10.0000 meq | Freq: Once | INTRAVENOUS | Status: AC
  Administered 2020-02-09: 10 meq via INTRAVENOUS
  Filled 2020-02-09: qty 100

## 2020-02-09 MED ORDER — ONDANSETRON HCL 4 MG/2ML IJ SOLN
4.0000 mg | Freq: Once | INTRAMUSCULAR | Status: AC
  Administered 2020-02-09: 4 mg via INTRAVENOUS
  Filled 2020-02-09: qty 2

## 2020-02-09 MED ORDER — SODIUM CHLORIDE 0.9 % IV SOLN: INTRAVENOUS | Status: DC

## 2020-02-09 MED ORDER — SODIUM CHLORIDE 0.9% FLUSH
3.0000 mL | Freq: Once | INTRAVENOUS | Status: AC
  Administered 2020-02-09: 3 mL via INTRAVENOUS

## 2020-02-09 MED ORDER — SODIUM CHLORIDE 0.9 % IV BOLUS
500.0000 mL | Freq: Once | INTRAVENOUS | Status: AC
  Administered 2020-02-09: 500 mL via INTRAVENOUS

## 2020-02-09 MED ORDER — POTASSIUM CHLORIDE CRYS ER 20 MEQ PO TBCR: 60.0000 meq | EXTENDED_RELEASE_TABLET | Freq: Once | ORAL | Status: DC

## 2020-02-09 MED ORDER — FENTANYL CITRATE (PF) 100 MCG/2ML IJ SOLN
50.0000 ug | Freq: Once | INTRAMUSCULAR | Status: AC
  Administered 2020-02-09: 50 ug via INTRAVENOUS
  Filled 2020-02-09: qty 2

## 2020-02-09 NOTE — ED Triage Notes (Signed)
Patient c/o abdominal pain, N/v/D since yesterday.

## 2020-02-09 NOTE — ED Provider Notes (Signed)
[place] DEPT Provider Note   CSN: [id number] Arrival date & time: 02/09/20  1742     History Chief Complaint  Patient presents with  . Abdominal Pain  . Emesis  . Diarrhea    Cindy [name] is a 29 y.o. female.  29 year old female with history of colon cancer who is status post ileostomy presents with nausea and diarrhea in her ileostomy bag.  Denies any emesis.  States that she was exposed to stomach virus at work.  That was several days ago and is now been experiencing abdominal cramping.  Denies any prior history of recent bowel obstruction.  No cough or congestion.  No fever or chills.  No urinary symptoms.  No treatment use prior to arrival        Past Medical History:  Diagnosis Date  . Bilateral flank pain    due to ureteral stents  . Chronic hypokalemia   . CKD (chronic kidney disease), stage II   . 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 Complication of anesthesia    post op aspiration pneumonia w/ lap. appy 12-23-2007 (emergent ruptured appendix)  . FAP (familial adenomatous polyposis) 09/30/2014  . Heart murmur   . History of acute renal failure    2015;  2016  . History of fetal demise, not currently pregnant    03-22-2013  stillborn at 79 wks  . History of kidney stones   . History of sepsis    admitted 12-03-2016 (post-op day 2 w/ bilateral ureteral stents and stone manipluation) discharged 12-06-2016  for urosepsis  . Hypomagnesemia   . Ileostomy in place [facility])   . Iron deficiency anemia   . Nephrolithiasis    BILATERAL   . Normocytic anemia   . Renal cyst, left    PER CT 11-12-2016  . Urgency of urination     Patient Active Problem List   Diagnosis Date Noted  . Lower abdominal pain 07/13/2018  . Elevated lipase  07/13/2018  . AKI (acute kidney injury) ([place]) 09/02/2017  . Dehydration 06/07/2017  . Sepsis, unspecified organism ([place]) 12/03/2016  . 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 Normocytic anemia 05/02/2015  . Hypotension   . High output ileostomy ([place]) 09/30/2014  . FAP (familial adenomatous polyposis) 09/30/2014  . Nephrolithiasis 09/30/2014  . Adenocarcinoma of colon ([place]) 05/26/2013  . Nausea with vomiting 05/21/2013  . Microcytic anemia 04/25/2013  . Hypokalemia 04/25/2013    Past Surgical History:  Procedure Laterality Date  . COLONOSCOPY N/A 05/24/2013   Procedure: COLONOSCOPY;  Surgeon: Missy Sabins, MD;  Location: [place];  Service: Endoscopy;  Laterality: N/A;  . COMPLETION PROTECTOMY AND REVISION ILEOSTOMY/ LYSIS ADHESIONS  07-28-2014    [facility]  . CYSTOSCOPY W/ RETROGRADES Right 12/15/2016   Procedure: CYSTOSCOPY WITH RETROGRADE PYELOGRAM;  Surgeon: Alexis Frock, MD;  Location: [facility];  Service: Urology;  Laterality: Right;  . CYSTOSCOPY W/ URETERAL STENT PLACEMENT Bilateral 11/01/2016   Procedure: CYSTOSCOPY WITH BILATERAL RETROGRADE PYELOGRAM BILATERAL URETERAL STENT PLACEMENT;  Surgeon: Alexis Frock, MD;  [facility];  Service: Urology;  Laterality: Bilateral;  . CYSTOSCOPY W/ URETERAL STENT PLACEMENT Right 12/15/2016   Procedure: CYSTOSCOPY WITH STENT REPLACEMENT;  Surgeon: Alexis Frock, MD;  Location: [facility];  Service: Urology;  Laterality: Right;  . CYSTOSCOPY W/ URETERAL STENT REMOVAL Bilateral 12/15/2016   Procedure: CYSTOSCOPY WITH STENT REMOVAL;  Surgeon: Alexis Frock, MD;  Location: [facility];  Service: Urology;  Laterality: Bilateral;  . CYSTOSCOPY WITH URETEROSCOPY Bilateral 12/01/2016   Procedure: FIRST STAGE CYSTOSCOPY WITH URETEROSCOPY,  STONE MANIPULATION and BASKETRY, STENT EXCHANGE;  Surgeon: Alexis Frock, MD;  Location: [facility];  Service: Urology;  Laterality:  Bilateral;  . CYSTOSCOPY WITH URETEROSCOPY Right 12/15/2016   Procedure: SECOND STAGE -CYSTOSCOPY WITH URETEROSCOPY AND STONE EXTRACTION WITH BASKET;  Surgeon: Alexis Frock, MD;  Location: [facility];  Service: Urology;  Laterality: Right;  . HOLMIUM LASER APPLICATION Bilateral 01/29/5973   Procedure: HOLMIUM LASER APPLICATION;  Surgeon: Alexis Frock, MD;  Location: [facility];  Service: Urology;  Laterality: Bilateral;  . HOLMIUM LASER APPLICATION Right 1/63/8453   Procedure: HOLMIUM LASER APPLICATION;  Surgeon: Alexis Frock, MD;  Location: [facility];  Service: Urology;  Laterality: Right;  . I & D EXTREMITY Left 11/14/2013   Procedure: IRRIGATION AND DEBRIDEMENT LEFT LONG FINGER;  Surgeon: Tennis Must, MD;  Location: [place];  Service: Orthopedics;  Laterality: Left;  . IM NAILING TIBIA Left 08/23/2010  . LAPAROSCOPIC APPENDECTOMY  12/23/2007  . LEG RECONSTRUCTION USING FASCIAL FLAP  ~ 2009  . SUBTOTAL COLECTOMY /  RESECTION ENBLOC DISTAL ILEUM/  ILEOSTOMY  07/ 2014  [facility]  . TRANSTHORACIC ECHOCARDIOGRAM  11/11/2011   ef 55-60%/  mild MR/  trivial PR and TR/  PASP 29-39mmHg/  trivial pericardial effusion identified     OB History    Gravida  1   Para  1   Term  0   Preterm  1   AB  0   Living        SAB  0   TAB  0   Ectopic  0   Multiple      Live Births              Family History  Adopted: Yes  Problem Relation Age of Onset  . ALS Mother   . Healthy Father   . Alcohol abuse Neg Hx   . Arthritis Neg Hx   . Asthma Neg Hx   . Birth defects Neg Hx   . Cancer Neg Hx   . COPD Neg Hx   . Depression Neg Hx   . Diabetes Neg Hx   . Drug abuse Neg Hx   . Early death Neg Hx   . Hearing loss Neg Hx   . Heart disease Neg Hx   . Hyperlipidemia Neg Hx   . Hypertension Neg Hx   . Kidney disease Neg Hx   . Learning disabilities Neg Hx   . Mental illness Neg Hx   . Mental retardation Neg Hx     . Miscarriages / Stillbirths Neg Hx   . Stroke Neg Hx   . Vision loss Neg Hx     Social History   Tobacco Use  . Smoking status: Never Smoker  . Smokeless tobacco: Never Used  Substance Use Topics  . Alcohol use: No  . Drug use: No    Home Medications Prior to Admission medications   Medication Sig Start Date End Date Taking? Authorizing Provider  acetaminophen (TYLENOL) 325 MG tablet Take 650 mg by mouth every 6 (six) hours as needed for mild pain or headache.    [provider]  cetirizine (ZYRTEC) 10  MG tablet Take 1 tablet (10 mg total) by mouth daily. Patient not taking: Reported on 12/16/2019 07/09/19   Loura Halt A, NP  cyanocobalamin (,VITAMIN B-12,) 1000 MCG/ML injection Inject 1,000 mcg into the muscle once a week.     [provider]  diclofenac sodium (VOLTAREN) 1 % GEL Apply 2 g topically 4 (four) times daily. Patient not taking: Reported on 12/16/2019 07/20/19   Petrucelli, Glynda Jaeger, PA-C  HYDROcodone-acetaminophen (NORCO/VICODIN) 5-325 MG tablet Take 1 tablet by mouth 2 times daily at 12 noon and 4 pm. Patient not taking: Reported on 09/24/2019 07/23/19   Couture, Cortni S, PA-C  loperamide (IMODIUM) 1 MG/5ML solution Take 3 mg by mouth as needed for diarrhea or loose stools.     [provider]  LORazepam (ATIVAN) 1 MG tablet Take 1 mg by mouth 3 (three) times daily as needed for anxiety.    [provider]  magnesium oxide (MAG-OX) 400 MG tablet Take 400 mg by mouth once a week.     [provider]  medroxyPROGESTERone (DEPO-PROVERA) 150 MG/ML injection Inject 150 mg into the muscle every 3 (three) months.    [provider]  Multiple Vitamins-Minerals (MULTIVITAMIN GUMMIES ADULT) CHEW Chew 2 each by mouth daily.     [provider]  ondansetron (ZOFRAN ODT) 4 MG disintegrating tablet Take 1 tablet (4 mg total) by mouth every 8 (eight) hours as needed for nausea or vomiting. 01/26/20   Charlann Lange, PA-C   potassium chloride 20 MEQ/15ML (10%) SOLN Take 20 mLs (26.6667 mEq total) by mouth 3 (three) times daily. 09/04/17   Bonnielee Haff, MD  potassium chloride SA (KLOR-CON) 20 MEQ tablet Take 2 tablets (40 mEq total) by mouth 3 (three) times daily for 14 days. 12/08/19 01/15/20  Tedd Sias, PA  Vitamin D, Ergocalciferol, (DRISDOL) 50000 units CAPS capsule Take 50,000 Units by mouth every Monday.  05/22/16   [provider]    Allergies    Food, Iron, Venofer [ferric oxide], Soap, Morphine and related, Sulfa antibiotics, Sulfamethoxazole-trimethoprim, and Levaquin [levofloxacin]  Review of Systems   Review of Systems  All other systems reviewed and are negative.   Physical Exam Updated Vital Signs BP 101/71   Pulse (!) 108   Temp 98.2 F (36.8 C) (Oral)   Resp 16   Ht 1.524 m (5')   Wt 42.2 kg   SpO2 100%   BMI 18.16 kg/m   Physical Exam Vitals and nursing note reviewed.  Constitutional:      General: She is not in acute distress.    Appearance: Normal appearance. She is well-developed. She is not toxic-appearing.  HENT:     Head: Normocephalic and atraumatic.  Eyes:     General: Lids are normal.     Conjunctiva/sclera: Conjunctivae normal.     Pupils: Pupils are equal, round, and reactive to light.  Neck:     Thyroid: No thyroid mass.     Trachea: No tracheal deviation.  Cardiovascular:     Rate and Rhythm: Normal rate and regular rhythm.     Heart sounds: Normal heart sounds. No murmur. No gallop.   Pulmonary:     Effort: Pulmonary effort is normal. No respiratory distress.     Breath sounds: Normal breath sounds. No stridor. No decreased breath sounds, wheezing, rhonchi or rales.  Abdominal:     General: Abdomen is flat. Bowel sounds are normal. There is no distension.     Palpations: Abdomen is soft.  Tenderness: There is no abdominal tenderness. There is no guarding or rebound.    Musculoskeletal:        General: No tenderness. Normal range of  motion.     Cervical back: Normal range of motion and neck supple.  Skin:    General: Skin is warm and dry.     Findings: No abrasion or rash.  Neurological:     Mental Status: She is alert and oriented to person, place, and time.     GCS: GCS eye subscore is 4. GCS verbal subscore is 5. GCS motor subscore is 6.     Cranial Nerves: No cranial nerve deficit.     Sensory: No sensory deficit.  Psychiatric:        Speech: Speech normal.        Behavior: Behavior normal.     ED Results / Procedures / Treatments   Labs (all labs ordered are listed, but only abnormal results are displayed) Labs Reviewed  LIPASE, BLOOD  COMPREHENSIVE METABOLIC PANEL  CBC  URINALYSIS, ROUTINE W REFLEX MICROSCOPIC  I-STAT BETA HCG BLOOD, ED ([facility], [facility], [facility] ONLY)    EKG None  Radiology No results found.  Procedures Procedures (including critical care time)  Medications Ordered in ED Medications  fentaNYL (SUBLIMAZE) injection 50 mcg (has no administration in time range)  ondansetron (ZOFRAN) injection 4 mg (has no administration in time range)  sodium chloride 0.9 % bolus 500 mL (has no administration in time range)  0.9 %  sodium chloride infusion (has no administration in time range)  sodium chloride flush (NS) 0.9 % injection 3 mL (3 mLs Intravenous Given 02/09/20 1835)    ED Course  I have reviewed the triage vital signs and the nursing notes.  Pertinent labs & imaging results that were available during my care of the patient were reviewed by me and considered in my medical decision making (see chart for details).    MDM Rules/Calculators/A&P                      Patient given IV fluids here along with IV potassium x2.  Also treated for pain as well.  Patient chronically is hypokalemic and is on oral potassium.  She also has a history of chronic kidney disease and her creatinine is around where she has been running.  Patient feels okay to go home will follow with her doctor. Final Clinical  Impression(s) / ED Diagnoses Final diagnoses:  None    Rx / DC Orders ED Discharge Orders    None       Lacretia Leigh, MD 02/09/20 2250

## 2020-02-09 NOTE — ED Notes (Signed)
CRITICAL VALUE STICKER  CRITICAL VALUE: K+ 2.5  DATE & TIME NOTIFIED: 02/09/20 1946  MD NOTIFIED: Zenia Resides MD  TIME OF NOTIFICATION: [phone]

## 2020-02-09 NOTE — Discharge Instructions (Addendum)
Continue taking your potassium as directed.  Your creatinine today was up to 2.57.  Call your nephrologist about this tomorrow.

## 2020-03-23 ENCOUNTER — Emergency Department (HOSPITAL_COMMUNITY)
Admission: EM | Admit: 2020-03-23 | Discharge: 2020-03-23 | Disposition: A | Discharge status: Home / Self Care | Payer: PRIVATE HEALTH INSURANCE | Attending: Emergency Medicine | Admitting: Emergency Medicine

## 2020-03-23 ENCOUNTER — Encounter (HOSPITAL_COMMUNITY): Payer: Self-pay | Admitting: Student

## 2020-03-23 ENCOUNTER — Other Ambulatory Visit: Payer: Self-pay

## 2020-03-23 DIAGNOSIS — R109 Unspecified abdominal pain: Secondary | ICD-10-CM | POA: Insufficient documentation

## 2020-03-23 DIAGNOSIS — R7989 Other specified abnormal findings of blood chemistry: Secondary | ICD-10-CM | POA: Diagnosis present

## 2020-03-23 DIAGNOSIS — Z933 Colostomy status: Secondary | ICD-10-CM | POA: Insufficient documentation

## 2020-03-23 DIAGNOSIS — N182 Chronic kidney disease, stage 2 (mild): Secondary | ICD-10-CM | POA: Insufficient documentation

## 2020-03-23 DIAGNOSIS — Z79899 Other long term (current) drug therapy: Secondary | ICD-10-CM | POA: Diagnosis not present

## 2020-03-23 DIAGNOSIS — E876 Hypokalemia: Secondary | ICD-10-CM | POA: Diagnosis not present

## 2020-03-23 DIAGNOSIS — Z85038 Personal history of other malignant neoplasm of large intestine: Secondary | ICD-10-CM | POA: Diagnosis not present

## 2020-03-23 LAB — BASIC METABOLIC PANEL
Anion gap: 11 (ref 5–15)
Anion gap: 11 (ref 5–15)
BUN: 19 mg/dL (ref 6–20)
BUN: 20 mg/dL (ref 6–20)
CO2: 17 mmol/L — ABNORMAL LOW (ref 22–32)
CO2: 17 mmol/L — ABNORMAL LOW (ref 22–32)
Calcium: 8.4 mg/dL — ABNORMAL LOW (ref 8.9–10.3)
Calcium: 8.4 mg/dL — ABNORMAL LOW (ref 8.9–10.3)
Chloride: 101 mmol/L (ref 98–111)
Chloride: 104 mmol/L (ref 98–111)
Creatinine, Ser: 1.87 mg/dL — ABNORMAL HIGH (ref 0.44–1.00)
Creatinine, Ser: 1.75 mg/dL — ABNORMAL HIGH (ref 0.44–1.00)
GFR calc Af Amer: 41 mL/min — ABNORMAL LOW (ref >60)
GFR calc Af Amer: 45 mL/min — ABNORMAL LOW (ref >60)
GFR calc non Af Amer: 35 mL/min — ABNORMAL LOW (ref >60)
GFR calc non Af Amer: 38 mL/min — ABNORMAL LOW (ref >60)
Glucose, Bld: 121 mg/dL — ABNORMAL HIGH (ref 70–99)
Glucose, Bld: 110 mg/dL — ABNORMAL HIGH (ref 70–99)
Potassium: 2.4 mmol/L — CL (ref 3.5–5.1)
Potassium: 2.8 mmol/L — ABNORMAL LOW (ref 3.5–5.1)
Sodium: 129 mmol/L — ABNORMAL LOW (ref 135–145)
Sodium: 132 mmol/L — ABNORMAL LOW (ref 135–145)

## 2020-03-23 LAB — CBC WITH DIFFERENTIAL/PLATELET
Abs Immature Granulocytes: 0.01 10*3/uL (ref 0.00–0.07)
Basophils Absolute: 0 10*3/uL (ref 0.0–0.1)
Basophils Relative: 0 %
Eosinophils Absolute: 0.1 10*3/uL (ref 0.0–0.5)
Eosinophils Relative: 1 %
HCT: 36.2 % (ref 36.0–46.0)
Hemoglobin: 11.9 g/dL — ABNORMAL LOW (ref 12.0–15.0)
Immature Granulocytes: 0 %
Lymphocytes Relative: 26 %
Lymphs Abs: 1.8 10*3/uL (ref 0.7–4.0)
MCH: 26.3 pg (ref 26.0–34.0)
MCHC: 32.9 g/dL (ref 30.0–36.0)
MCV: 79.9 fL — ABNORMAL LOW (ref 80.0–100.0)
Monocytes Absolute: 0.6 10*3/uL (ref 0.1–1.0)
Monocytes Relative: 9 %
Neutro Abs: 4.3 10*3/uL (ref 1.7–7.7)
Neutrophils Relative %: 64 %
Platelets: 275 10*3/uL (ref 150–400)
RBC: 4.53 MIL/uL (ref 3.87–5.11)
RDW: 22 % — ABNORMAL HIGH (ref 11.5–15.5)
WBC: 6.8 10*3/uL (ref 4.0–10.5)
nRBC: 0 % (ref 0.0–0.2)

## 2020-03-23 LAB — MAGNESIUM: Magnesium: 1.7 mg/dL (ref 1.7–2.4)

## 2020-03-23 MED ORDER — POTASSIUM CHLORIDE CRYS ER 20 MEQ PO TBCR
60.0000 meq | EXTENDED_RELEASE_TABLET | Freq: Once | ORAL | Status: AC
  Administered 2020-03-23: 13:00:00 60 meq via ORAL
  Filled 2020-03-23: qty 3

## 2020-03-23 MED ORDER — FENTANYL CITRATE (PF) 100 MCG/2ML IJ SOLN
50.0000 ug | Freq: Once | INTRAMUSCULAR | Status: AC
  Administered 2020-03-23: 50 ug via INTRAVENOUS
  Filled 2020-03-23: qty 2

## 2020-03-23 MED ORDER — ACETAMINOPHEN 325 MG PO TABS
650.0000 mg | ORAL_TABLET | Freq: Once | ORAL | Status: AC
  Administered 2020-03-23: 650 mg via ORAL
  Filled 2020-03-23: qty 2

## 2020-03-23 MED ORDER — SODIUM CHLORIDE 0.9 % IV SOLN: INTRAVENOUS | Status: DC

## 2020-03-23 MED ORDER — POTASSIUM CHLORIDE 10 MEQ/100ML IV SOLN
10.0000 meq | Freq: Once | INTRAVENOUS | Status: AC
  Administered 2020-03-23: 10 meq via INTRAVENOUS
  Filled 2020-03-23: qty 100

## 2020-03-23 NOTE — ED Notes (Addendum)
Date and time results received: 03/23/20 1315   Test: potassium Critical Value: 2.4  Name of Provider Notified: Zenia Resides, EDP  Orders Received? Or Actions Taken?:

## 2020-03-23 NOTE — ED Provider Notes (Addendum)
[place] DEPT Provider Note   CSN: [id number] Arrival date & time: 03/23/20  1138     History Chief Complaint  Patient presents with  . Abnormal Lab    Cindy [name] is a 29 y.o. female.  29 year old female with history of chronic hypokalemia presents after having blood work being drawn yesterday that showed a potassium of 2.2.  Patient according to old chart shows that she has missed doses of her oral potassium tablets.  Denies any emesis.  Has noted increased output from her colostomy bag.  Has had some abdominal cramping which she normally gets when she has hypokalemia.  Denies any fever or chills.        Past Medical History:  Diagnosis Date  . Bilateral flank pain    due to ureteral stents  . Chronic hypokalemia   . CKD (chronic kidney disease), stage II   . 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 Complication of anesthesia    post op aspiration pneumonia w/ lap. appy 12-23-2007 (emergent ruptured appendix)  . FAP (familial adenomatous polyposis) 09/30/2014  . Heart murmur   . History of acute renal failure    2015;  2016  . History of fetal demise, not currently pregnant    03-22-2013  stillborn at 26 wks  . History of kidney stones   . History of sepsis    admitted 12-03-2016 (post-op day 2 w/ bilateral ureteral stents and stone manipluation) discharged 12-06-2016  for urosepsis  . Hypomagnesemia   . Ileostomy in place [facility])   . Iron deficiency anemia   . Nephrolithiasis    BILATERAL   . Normocytic anemia   . Renal cyst, left    PER CT 11-12-2016  . Urgency of urination     Patient Active Problem List   Diagnosis Date Noted  . Lower abdominal pain 07/13/2018  . Elevated lipase 07/13/2018  . AKI (acute kidney injury) ([place])  09/02/2017  . Dehydration 06/07/2017  . Sepsis, unspecified organism ([place]) 12/03/2016  . 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 Normocytic anemia 05/02/2015  . Hypotension   . High output ileostomy ([place]) 09/30/2014  . FAP (familial adenomatous polyposis) 09/30/2014  . Nephrolithiasis 09/30/2014  . Adenocarcinoma of colon ([place]) 05/26/2013  . Nausea with vomiting 05/21/2013  . Microcytic anemia 04/25/2013  . Hypokalemia 04/25/2013    Past Surgical History:  Procedure Laterality Date  . COLONOSCOPY N/A 05/24/2013   Procedure: COLONOSCOPY;  Surgeon: Missy Sabins, MD;  Location: [place];  Service: Endoscopy;  Laterality: N/A;  . COMPLETION PROTECTOMY AND REVISION ILEOSTOMY/ LYSIS ADHESIONS  07-28-2014    [facility]  . CYSTOSCOPY W/ RETROGRADES Right 12/15/2016   Procedure: CYSTOSCOPY WITH RETROGRADE PYELOGRAM;  Surgeon: Alexis Frock, MD;  Location: [facility];  Service: Urology;  Laterality: Right;  . CYSTOSCOPY W/ URETERAL STENT PLACEMENT Bilateral 11/01/2016   Procedure: CYSTOSCOPY WITH BILATERAL RETROGRADE PYELOGRAM BILATERAL URETERAL STENT PLACEMENT;  Surgeon: Alexis Frock, MD;  [facility];  Service: Urology;  Laterality: Bilateral;  . CYSTOSCOPY W/ URETERAL STENT PLACEMENT Right 12/15/2016   Procedure: CYSTOSCOPY WITH STENT REPLACEMENT;  Surgeon: Alexis Frock, MD;  Location: [facility];  Service: Urology;  Laterality: Right;  . CYSTOSCOPY W/ URETERAL STENT REMOVAL Bilateral 12/15/2016   Procedure:  CYSTOSCOPY WITH STENT REMOVAL;  Surgeon: Alexis Frock, MD;  Location: [facility];  Service: Urology;  Laterality: Bilateral;  . CYSTOSCOPY WITH URETEROSCOPY Bilateral 12/01/2016   Procedure: FIRST STAGE CYSTOSCOPY WITH URETEROSCOPY,  STONE MANIPULATION and BASKETRY, STENT EXCHANGE;  Surgeon: Alexis Frock, MD;  Location: [facility];  Service: Urology;  Laterality: Bilateral;  . CYSTOSCOPY WITH URETEROSCOPY  Right 12/15/2016   Procedure: SECOND STAGE -CYSTOSCOPY WITH URETEROSCOPY AND STONE EXTRACTION WITH BASKET;  Surgeon: Alexis Frock, MD;  Location: [facility];  Service: Urology;  Laterality: Right;  . HOLMIUM LASER APPLICATION Bilateral 05/29/4192   Procedure: HOLMIUM LASER APPLICATION;  Surgeon: Alexis Frock, MD;  Location: [facility];  Service: Urology;  Laterality: Bilateral;  . HOLMIUM LASER APPLICATION Right 7/90/2409   Procedure: HOLMIUM LASER APPLICATION;  Surgeon: Alexis Frock, MD;  Location: [facility];  Service: Urology;  Laterality: Right;  . I & D EXTREMITY Left 11/14/2013   Procedure: IRRIGATION AND DEBRIDEMENT LEFT LONG FINGER;  Surgeon: Tennis Must, MD;  Location: [place];  Service: Orthopedics;  Laterality: Left;  . IM NAILING TIBIA Left 08/23/2010  . LAPAROSCOPIC APPENDECTOMY  12/23/2007  . LEG RECONSTRUCTION USING FASCIAL FLAP  ~ 2009  . SUBTOTAL COLECTOMY /  RESECTION ENBLOC DISTAL ILEUM/  ILEOSTOMY  07/ 2014  [facility]  . TRANSTHORACIC ECHOCARDIOGRAM  11/11/2011   ef 55-60%/  mild MR/  trivial PR and TR/  PASP 29-40mmHg/  trivial pericardial effusion identified     OB History    Gravida  1   Para  1   Term  0   Preterm  1   AB  0   Living        SAB  0   TAB  0   Ectopic  0   Multiple      Live Births              Family History  Adopted: Yes  Problem Relation Age of Onset  . ALS Mother   . Healthy Father   . Alcohol abuse Neg Hx   . Arthritis Neg Hx   . Asthma Neg Hx   . Birth defects Neg Hx   . Cancer Neg Hx   . COPD Neg Hx   . Depression Neg Hx   . Diabetes Neg Hx   . Drug abuse Neg Hx   . Early death Neg Hx   . Hearing loss Neg Hx   . Heart disease Neg Hx   . Hyperlipidemia Neg Hx   . Hypertension Neg Hx   . Kidney disease Neg Hx   . Learning disabilities Neg Hx   . Mental illness Neg Hx   . Mental retardation Neg Hx   . Miscarriages / Stillbirths Neg Hx   .  Stroke Neg Hx   . Vision loss Neg Hx     Social History   Tobacco Use  . Smoking status: Never Smoker  . Smokeless tobacco: Never Used  Substance Use Topics  . Alcohol use: No  . Drug use: No    Home Medications Prior to Admission medications   Medication Sig Start Date End Date Taking? Authorizing Provider  acetaminophen (TYLENOL) 325 MG tablet Take 650 mg by mouth every 6 (six) hours as needed for mild pain or headache.    [provider]  cetirizine (ZYRTEC) 10 MG tablet Take 1 tablet (10 mg total) by mouth daily. Patient not taking: Reported  on 12/16/2019 07/09/19   Loura Halt A, NP  cyanocobalamin (,VITAMIN B-12,) 1000 MCG/ML injection Inject 1,000 mcg into the muscle once a week.     [provider]  diclofenac sodium (VOLTAREN) 1 % GEL Apply 2 g topically 4 (four) times daily. Patient not taking: Reported on 12/16/2019 07/20/19   Petrucelli, Glynda Jaeger, PA-C  HYDROcodone-acetaminophen (NORCO/VICODIN) 5-325 MG tablet Take 1 tablet by mouth 2 times daily at 12 noon and 4 pm. Patient not taking: Reported on 09/24/2019 07/23/19   Couture, Cortni S, PA-C  hyoscyamine (ANASPAZ) 0.125 MG TBDP disintergrating tablet Place 0.125 mg under the tongue daily. May take additional dose prn 01/28/20   [provider]  loperamide (IMODIUM) 1 MG/5ML solution Take 3 mg by mouth as needed for diarrhea or loose stools.     [provider]  LORazepam (ATIVAN) 1 MG tablet Take 1 mg by mouth 3 (three) times daily as needed for anxiety.    [provider]  magnesium oxide (MAG-OX) 400 MG tablet Take 400 mg by mouth once a week.     [provider]  medroxyPROGESTERone (DEPO-PROVERA) 150 MG/ML injection Inject 150 mg into the muscle every 3 (three) months.    [provider]  Multiple Vitamins-Minerals (MULTIVITAMIN GUMMIES ADULT) CHEW Chew 2 each by mouth daily.     [provider]  ondansetron (ZOFRAN ODT) 4 MG disintegrating tablet Take  1 tablet (4 mg total) by mouth every 8 (eight) hours as needed for nausea or vomiting. 01/26/20   Charlann Lange, PA-C  potassium chloride 20 MEQ/15ML (10%) SOLN Take 20 mLs (26.6667 mEq total) by mouth 3 (three) times daily. 09/04/17   Bonnielee Haff, MD  potassium chloride SA (KLOR-CON) 20 MEQ tablet Take 2 tablets (40 mEq total) by mouth 3 (three) times daily for 14 days. 12/08/19 02/09/20  Tedd Sias, PA  Vitamin D, Ergocalciferol, (DRISDOL) 50000 units CAPS capsule Take 50,000 Units by mouth every Monday.  05/22/16   [provider]    Allergies    Food, Iron, Venofer [ferric oxide], Soap, Morphine and related, Sulfa antibiotics, Sulfamethoxazole-trimethoprim, and Levaquin [levofloxacin]  Review of Systems   Review of Systems  All other systems reviewed and are negative.   Physical Exam Updated Vital Signs BP 100/74   Pulse 82   Temp 98.7 F (37.1 C) (Oral)   Resp 14   SpO2 100%   Physical Exam Vitals and nursing note reviewed.  Constitutional:      General: She is not in acute distress.    Appearance: Normal appearance. She is well-developed. She is not toxic-appearing.  HENT:     Head: Normocephalic and atraumatic.  Eyes:     General: Lids are normal.     Conjunctiva/sclera: Conjunctivae normal.     Pupils: Pupils are equal, round, and reactive to light.  Neck:     Thyroid: No thyroid mass.     Trachea: No tracheal deviation.  Cardiovascular:     Rate and Rhythm: Normal rate and regular rhythm.     Heart sounds: Normal heart sounds. No murmur. No gallop.   Pulmonary:     Effort: Pulmonary effort is normal. No respiratory distress.     Breath sounds: Normal breath sounds. No stridor. No decreased breath sounds, wheezing, rhonchi or rales.  Abdominal:     General: Bowel sounds are normal. There is no distension.     Palpations: Abdomen is soft.     Tenderness: There is no abdominal tenderness.  There is no rebound.    Musculoskeletal:        General: No  tenderness. Normal range of motion.     Cervical back: Normal range of motion and neck supple.  Skin:    General: Skin is warm and dry.     Findings: No abrasion or rash.  Neurological:     Mental Status: She is alert and oriented to person, place, and time.     GCS: GCS eye subscore is 4. GCS verbal subscore is 5. GCS motor subscore is 6.     Cranial Nerves: No cranial nerve deficit.     Sensory: No sensory deficit.  Psychiatric:        Speech: Speech normal.        Behavior: Behavior normal.     ED Results / Procedures / Treatments   Labs (all labs ordered are listed, but only abnormal results are displayed) Labs Reviewed  CBC WITH DIFFERENTIAL/PLATELET - Abnormal; Notable for the following components:      Result Value   Hemoglobin 11.9 (*)    MCV 79.9 (*)    RDW 22.0 (*)    All other components within normal limits  BASIC METABOLIC PANEL  MAGNESIUM    EKG None  Radiology No results found.  Procedures Procedures (including critical care time)  Medications Ordered in ED Medications  0.9 %  sodium chloride infusion ( Intravenous New Bag/Given 03/23/20 1225)  potassium chloride 10 mEq in 100 mL IVPB (has no administration in time range)  potassium chloride SA (KLOR-CON) CR tablet 60 mEq (has no administration in time range)  acetaminophen (TYLENOL) tablet 650 mg (has no administration in time range)    ED Course  I have reviewed the triage vital signs and the nursing notes.  Pertinent labs & imaging results that were available during my care of the patient were reviewed by me and considered in my medical decision making (see chart for details).    MDM Rules/Calculators/A&P                      Patient given potassium here due to her hypokalemia.  Magnesium within normal limits.  Potassium now is 2.8.  This is around her baseline.  She has a history of chronic renal insufficiency which is unchanged.  Was given fentanyl for pain which is improved her cramping.  Is  able to take oral liquids here and will discharge home Final Clinical Impression(s) / ED Diagnoses Final diagnoses:  None    Rx / DC Orders ED Discharge Orders    None       Lacretia Leigh, MD 03/23/20 1518    Lacretia Leigh, MD 03/23/20 [phone]

## 2020-04-14 ENCOUNTER — Encounter (HOSPITAL_COMMUNITY): Payer: Self-pay

## 2020-04-14 ENCOUNTER — Other Ambulatory Visit: Payer: Self-pay

## 2020-04-14 ENCOUNTER — Emergency Department (HOSPITAL_COMMUNITY)
Admission: EM | Admit: 2020-04-14 | Discharge: 2020-04-15 | Disposition: A | Discharge status: Home / Self Care | Payer: PRIVATE HEALTH INSURANCE | Attending: Emergency Medicine | Admitting: Emergency Medicine

## 2020-04-14 DIAGNOSIS — Z85038 Personal history of other malignant neoplasm of large intestine: Secondary | ICD-10-CM | POA: Diagnosis not present

## 2020-04-14 DIAGNOSIS — Z79899 Other long term (current) drug therapy: Secondary | ICD-10-CM | POA: Diagnosis not present

## 2020-04-14 DIAGNOSIS — N3000 Acute cystitis without hematuria: Secondary | ICD-10-CM | POA: Diagnosis not present

## 2020-04-14 DIAGNOSIS — D72829 Elevated white blood cell count, unspecified: Secondary | ICD-10-CM | POA: Diagnosis present

## 2020-04-14 LAB — COMPREHENSIVE METABOLIC PANEL
ALT: 13 U/L (ref 0–44)
AST: 14 U/L — ABNORMAL LOW (ref 15–41)
Albumin: 4.5 g/dL (ref 3.5–5.0)
Alkaline Phosphatase: 99 U/L (ref 38–126)
Anion gap: 12 (ref 5–15)
BUN: 25 mg/dL — ABNORMAL HIGH (ref 6–20)
CO2: 15 mmol/L — ABNORMAL LOW (ref 22–32)
Calcium: 9.1 mg/dL (ref 8.9–10.3)
Chloride: 100 mmol/L (ref 98–111)
Creatinine, Ser: 2.03 mg/dL — ABNORMAL HIGH (ref 0.44–1.00)
GFR calc Af Amer: 37 mL/min — ABNORMAL LOW (ref >60)
GFR calc non Af Amer: 32 mL/min — ABNORMAL LOW (ref >60)
Glucose, Bld: 114 mg/dL — ABNORMAL HIGH (ref 70–99)
Potassium: 2.6 mmol/L — CL (ref 3.5–5.1)
Sodium: 127 mmol/L — ABNORMAL LOW (ref 135–145)
Total Bilirubin: 0.6 mg/dL (ref 0.3–1.2)
Total Protein: 8.3 g/dL — ABNORMAL HIGH (ref 6.5–8.1)

## 2020-04-14 LAB — LIPASE, BLOOD: Lipase: 48 U/L (ref 11–51)

## 2020-04-14 LAB — CBC
HCT: 38.8 % (ref 36.0–46.0)
Hemoglobin: 12.9 g/dL (ref 12.0–15.0)
MCH: 26.7 pg (ref 26.0–34.0)
MCHC: 33.2 g/dL (ref 30.0–36.0)
MCV: 80.3 fL (ref 80.0–100.0)
Platelets: 384 10*3/uL (ref 150–400)
RBC: 4.83 MIL/uL (ref 3.87–5.11)
RDW: 18.2 % — ABNORMAL HIGH (ref 11.5–15.5)
WBC: 15 10*3/uL — ABNORMAL HIGH (ref 4.0–10.5)
nRBC: 0 % (ref 0.0–0.2)

## 2020-04-14 LAB — I-STAT BETA HCG BLOOD, ED (MC, WL, AP ONLY): I-stat hCG, quantitative: <5 m[IU]/mL (ref <5)

## 2020-04-14 NOTE — ED Triage Notes (Signed)
Pt c/o mid abdominal pain and sts kidney DR wanted her to come here regarding low potassium (2.5) and sodium (127).

## 2020-04-14 NOTE — ED Notes (Signed)
Pt sts chest pain while waiting in waiting room. Order for EKG obtained.

## 2020-04-15 LAB — URINALYSIS, ROUTINE W REFLEX MICROSCOPIC
Bilirubin Urine: NEGATIVE
Glucose, UA: NEGATIVE mg/dL
Hgb urine dipstick: NEGATIVE
Ketones, ur: NEGATIVE mg/dL
Nitrite: POSITIVE — AB
Protein, ur: 30 mg/dL — AB
Specific Gravity, Urine: 1.01 (ref 1.005–1.030)
pH: 6 (ref 5.0–8.0)

## 2020-04-15 LAB — MAGNESIUM: Magnesium: 2 mg/dL (ref 1.7–2.4)

## 2020-04-15 MED ORDER — FENTANYL CITRATE (PF) 100 MCG/2ML IJ SOLN
25.0000 ug | Freq: Once | INTRAMUSCULAR | Status: AC
  Administered 2020-04-15: 25 ug via INTRAVENOUS
  Filled 2020-04-15: qty 2

## 2020-04-15 MED ORDER — CEPHALEXIN 500 MG PO CAPS: 500.0000 mg | ORAL_CAPSULE | Freq: Two times a day (BID) | ORAL | 0 refills | Status: AC

## 2020-04-15 MED ORDER — SODIUM CHLORIDE 0.9 % IV SOLN
1.0000 g | Freq: Once | INTRAVENOUS | Status: AC
  Administered 2020-04-15: 1 g via INTRAVENOUS
  Filled 2020-04-15: qty 10

## 2020-04-15 MED ORDER — SODIUM CHLORIDE 0.9 % IV BOLUS (SEPSIS)
1000.0000 mL | Freq: Once | INTRAVENOUS | Status: AC
  Administered 2020-04-15: 1000 mL via INTRAVENOUS

## 2020-04-15 MED ORDER — ONDANSETRON HCL 4 MG/2ML IJ SOLN
4.0000 mg | Freq: Once | INTRAMUSCULAR | Status: AC
  Administered 2020-04-15: 4 mg via INTRAVENOUS
  Filled 2020-04-15: qty 2

## 2020-04-15 MED ORDER — POTASSIUM CHLORIDE CRYS ER 20 MEQ PO TBCR
40.0000 meq | EXTENDED_RELEASE_TABLET | Freq: Once | ORAL | Status: AC
  Administered 2020-04-15: 40 meq via ORAL
  Filled 2020-04-15: qty 2

## 2020-04-15 NOTE — ED Provider Notes (Signed)
[facility] DEPT Provider Note   CSN: [id number] Arrival date & time: 04/14/20  2012     History Chief Complaint  Patient presents with  . Abdominal Pain    Cindy [name] is a 29 y.o. female.  Patient is a 29 year old female with a past medical history of colon cancer with ileostomy in place, chronic hypokalemia, CKD, chronic abdominal pain presenting to the emergency department for leukocytosis.  Patient had labs drawn by her nephrologist and apparently had an elevated white blood cell count.  They were concerned for possible infection so they sent her here.  Patient reports that she began to have belly pain and nausea since yesterday but this is her usual belly pain and nausea.  She denies any fever, chills, chest pain, shortness of breath, cough or URI symptoms, dysuria.  She reports that her potassium usually runs between 2.5 and 2.9.  Reports taking her potassium pills daily without missing doses.  Denies any pain or skin changes around her port site. Denies change in consistency or ileostomy output        Past Medical History:  Diagnosis Date  . Bilateral flank pain    due to ureteral stents  . Chronic hypokalemia   . CKD (chronic kidney disease), stage II   . Colon cancer ([place]) DX  2014---  ONCOLOGIST AT [facility]   DX RIGHT ACSECDING CARCINOMA IN BACKGROUND FAP AND SEVERE MALNUTRITION-- Stage 2A  (pT3, N0, M0)  s/p subtotal colecotmy and completed protectomy and ileostomy w/ revision  . Complication of anesthesia    post op aspiration pneumonia w/ lap. appy 12-23-2007 (emergent ruptured appendix)  . FAP (familial adenomatous polyposis) 09/30/2014  . Heart murmur   . History of acute renal failure    2015;  2016  . History of fetal demise, not currently pregnant    03-22-2013  stillborn at 72 wks  . History of kidney stones   . History of sepsis    admitted 12-03-2016 (post-op day 2 w/ bilateral ureteral stents and stone manipluation)  discharged 12-06-2016  for urosepsis  . Hypomagnesemia   . Ileostomy in place [facility])   . Iron deficiency anemia   . Nephrolithiasis    BILATERAL   . Normocytic anemia   . Renal cyst, left    PER CT 11-12-2016  . Urgency of urination     Patient Active Problem List   Diagnosis Date Noted  . Lower abdominal pain 07/13/2018  . Elevated lipase 07/13/2018  . AKI (acute kidney injury) ([place]) 09/02/2017  . Dehydration 06/07/2017  . Sepsis, unspecified organism ([place]) 12/03/2016  . 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 Normocytic anemia 05/02/2015  . Hypotension   . High output ileostomy ([place]) 09/30/2014  . FAP (familial adenomatous polyposis) 09/30/2014  . Nephrolithiasis 09/30/2014  . Adenocarcinoma of colon ([place]) 05/26/2013  . Nausea with vomiting 05/21/2013  . Microcytic anemia 04/25/2013  . Hypokalemia 04/25/2013    Past Surgical History:  Procedure Laterality Date  . COLONOSCOPY N/A 05/24/2013   Procedure: COLONOSCOPY;  Surgeon: Missy Sabins, MD;  Location: [facility];  Service: Endoscopy;  Laterality: N/A;  . COMPLETION PROTECTOMY AND REVISION ILEOSTOMY/ LYSIS ADHESIONS  07-28-2014    [facility]  . CYSTOSCOPY W/ RETROGRADES Right 12/15/2016   Procedure: CYSTOSCOPY WITH RETROGRADE PYELOGRAM;  Surgeon: Alexis Frock, MD;  Location[facility];  Service: Urology;  Laterality: Right;  . CYSTOSCOPY W/ URETERAL STENT PLACEMENT Bilateral 11/01/2016   Procedure: CYSTOSCOPY WITH  BILATERAL RETROGRADE PYELOGRAM BILATERAL URETERAL STENT PLACEMENT;  Surgeon: Alexis Frock, MD;  Location: [facility];  Service: Urology;  Laterality: Bilateral;  . CYSTOSCOPY W/ URETERAL STENT PLACEMENT Right 12/15/2016   Procedure: CYSTOSCOPY WITH STENT REPLACEMENT;  Surgeon: Alexis Frock, MD;  Location: [facility];  Service: Urology;  Laterality: Right;  . CYSTOSCOPY W/ URETERAL STENT REMOVAL Bilateral 12/15/2016   Procedure: CYSTOSCOPY WITH STENT REMOVAL;  Surgeon:  Alexis Frock, MD;  Location[facility];  Service: Urology;  Laterality: Bilateral;  . CYSTOSCOPY WITH URETEROSCOPY Bilateral 12/01/2016   Procedure: FIRST STAGE CYSTOSCOPY WITH URETEROSCOPY,  STONE MANIPULATION and BASKETRY, STENT EXCHANGE;  Surgeon: Alexis Frock, MD;  Location: [facility];  Service: Urology;  Laterality: Bilateral;  . CYSTOSCOPY WITH URETEROSCOPY Right 12/15/2016   Procedure: SECOND STAGE -CYSTOSCOPY WITH URETEROSCOPY AND STONE EXTRACTION WITH BASKET;  Surgeon: Alexis Frock, MD;  Location: [facility];  Service: Urology;  Laterality: Right;  . HOLMIUM LASER APPLICATION Bilateral 2/0/2334   Procedure: HOLMIUM LASER APPLICATION;  Surgeon: Alexis Frock, MD;  Location: [facility];  Service: Urology;  Laterality: Bilateral;  . HOLMIUM LASER APPLICATION Right 3/56/8616   Procedure: HOLMIUM LASER APPLICATION;  Surgeon: Alexis Frock, MD;  Location: [facility];  Service: Urology;  Laterality: Right;  . I & D EXTREMITY Left 11/14/2013   Procedure: IRRIGATION AND DEBRIDEMENT LEFT LONG FINGER;  Surgeon: Tennis Must, MD;  Location: [facility];  Service: Orthopedics;  Laterality: Left;  . IM NAILING TIBIA Left 08/23/2010  . LAPAROSCOPIC APPENDECTOMY  12/23/2007  . LEG RECONSTRUCTION USING FASCIAL FLAP  ~ 2009  . SUBTOTAL COLECTOMY /  RESECTION ENBLOC DISTAL ILEUM/  ILEOSTOMY  07/ 2014  [facility]  . TRANSTHORACIC ECHOCARDIOGRAM  11/11/2011   ef 55-60%/  mild MR/  trivial PR and TR/  PASP 29-56mmHg/  trivial pericardial effusion identified     OB History    Gravida  1   Para  1   Term  0   Preterm  1   AB  0   Living        SAB  0   TAB  0   Ectopic  0   Multiple      Live Births              Family History  Adopted: Yes  Problem Relation Age of Onset  . ALS Mother   . Healthy Father   . Alcohol abuse Neg Hx   . Arthritis Neg Hx   . Asthma Neg Hx   . Birth  defects Neg Hx   . Cancer Neg Hx   . COPD Neg Hx   . Depression Neg Hx   . Diabetes Neg Hx   . Drug abuse Neg Hx   . Early death Neg Hx   . Hearing loss Neg Hx   . Heart disease Neg Hx   . Hyperlipidemia Neg Hx   . Hypertension Neg Hx   . Kidney disease Neg Hx   . Learning disabilities Neg Hx   . Mental illness Neg Hx   . Mental retardation Neg Hx   . Miscarriages / Stillbirths Neg Hx   . Stroke Neg Hx   . Vision loss Neg Hx     Social History   Tobacco Use  . Smoking status: Never Smoker  . Smokeless tobacco: Never Used  Substance Use Topics  . Alcohol use: No  . Drug use: No  Home Medications Prior to Admission medications   Medication Sig Start Date End Date Taking? Authorizing Provider  acetaminophen (TYLENOL) 325 MG tablet Take 650 mg by mouth every 6 (six) hours as needed for mild pain or headache.   Yes [provider]  cyanocobalamin (,VITAMIN B-12,) 1000 MCG/ML injection Inject 1,000 mcg into the muscle once a week.    Yes [provider]  hyoscyamine (ANASPAZ) 0.125 MG TBDP disintergrating tablet Place 0.125 mg under the tongue daily. May take additional tablet   Yes [provider]  loperamide (IMODIUM) 1 MG/5ML solution Take 3 mg by mouth as needed for diarrhea or loose stools.    Yes [provider]  magnesium oxide (MAG-OX) 400 MG tablet Take 400 mg by mouth once a week.    Yes [provider]  medroxyPROGESTERone (DEPO-PROVERA) 150 MG/ML injection Inject 150 mg into the muscle every 3 (three) months.   Yes [provider]  Multiple Vitamins-Minerals (MULTIVITAMIN GUMMIES ADULT) CHEW Chew 2 each by mouth daily.    Yes [provider]  potassium chloride SA (KLOR-CON) 20 MEQ tablet Take 20 mEq by mouth 2 (two) times daily.   Yes [provider]  Vitamin D, Ergocalciferol, (DRISDOL) 50000 units CAPS capsule Take 50,000 Units by mouth every Monday.  05/22/16  Yes [provider]    cephALEXin (KEFLEX) 500 MG capsule Take 1 capsule (500 mg total) by mouth 2 (two) times daily for 7 days. 04/15/20 04/22/20  Alveria Apley, PA-C  cetirizine (ZYRTEC) 10 MG tablet Take 1 tablet (10 mg total) by mouth daily. Patient not taking: Reported on 12/16/2019 07/09/19   Orvan July, NP    Allergies    Food, Iron, Venofer [ferric oxide], Soap, Morphine and related, Sulfa antibiotics, Sulfamethoxazole-trimethoprim, and Levaquin [levofloxacin]  Review of Systems   Review of Systems  Constitutional: Positive for appetite change. Negative for fatigue and fever.  HENT: Negative for congestion, rhinorrhea and sore throat.   Respiratory: Negative for cough and shortness of breath.   Cardiovascular: Negative for chest pain.  Gastrointestinal: Positive for abdominal pain and nausea. Negative for vomiting.  Genitourinary: Negative for dysuria and vaginal discharge.  Musculoskeletal: Negative for arthralgias, back pain and myalgias.  Skin: Negative for rash and wound.  Allergic/Immunologic: Positive for immunocompromised state.  Neurological: Negative for dizziness and light-headedness.  Hematological: Does not bruise/bleed easily.    Physical Exam Updated Vital Signs BP (!) 96/59   Pulse 89   Temp 98.3 F (36.8 C)   Resp 15   Ht 5' (1.524 m)   Wt 42.2 kg   SpO2 100%   BMI 18.16 kg/m   Physical Exam Vitals and nursing note reviewed.  Constitutional:      Appearance: Normal appearance.  HENT:     Head: Normocephalic.  Eyes:     Conjunctiva/sclera: Conjunctivae normal.  Cardiovascular:     Rate and Rhythm: Normal rate and regular rhythm.     Heart sounds: Normal heart sounds.  Pulmonary:     Effort: Pulmonary effort is normal.  Abdominal:     General: Abdomen is flat. Bowel sounds are normal.     Palpations: Abdomen is soft.     Tenderness: There is abdominal tenderness.    Skin:    General: Skin is warm and dry.  Neurological:     Mental Status: She is alert.   Psychiatric:        Mood and Affect: Mood normal.     ED Results /  Procedures / Treatments   Labs (all labs ordered are listed, but only abnormal results are displayed) Labs Reviewed  COMPREHENSIVE METABOLIC PANEL - Abnormal; Notable for the following components:      Result Value   Sodium 127 (*)    Potassium 2.6 (*)    CO2 15 (*)    Glucose, Bld 114 (*)    BUN 25 (*)    Creatinine, Ser 2.03 (*)    Total Protein 8.3 (*)    AST 14 (*)    GFR calc non Af Amer 32 (*)    GFR calc Af Amer 37 (*)    All other components within normal limits  CBC - Abnormal; Notable for the following components:   WBC 15.0 (*)    RDW 18.2 (*)    All other components within normal limits  URINALYSIS, ROUTINE W REFLEX MICROSCOPIC - Abnormal; Notable for the following components:   Protein, ur 30 (*)    Nitrite POSITIVE (*)    Leukocytes,Ua TRACE (*)    Bacteria, UA MANY (*)    All other components within normal limits  URINE CULTURE  LIPASE, BLOOD  MAGNESIUM  I-STAT BETA HCG BLOOD, ED ([facility], [facility], [facility] ONLY)    EKG EKG Interpretation  Date/Time:  Wednesday Apr 14 2020 21:25:31 EDT Ventricular Rate:  104 PR Interval:    QRS Duration: 65 QT Interval:  317 QTC Calculation: 417 R Axis:   86 Text Interpretation: Sinus tachycardia Right atrial enlargement Borderline T wave abnormalities Baseline wander in lead(s) III aVL since last tracing no significant change Confirmed by Daleen Bo [phone]) on 04/15/2020 12:42:12 AM   Radiology No results found.  Procedures Procedures (including critical care time)  Medications Ordered in ED Medications  sodium chloride 0.9 % bolus 1,000 mL (0 mLs Intravenous Stopped 04/15/20 0352)  fentaNYL (SUBLIMAZE) injection 25 mcg (25 mcg Intravenous Given 04/15/20 0104)  ondansetron (ZOFRAN) injection 4 mg (4 mg Intravenous Given 04/15/20 0104)  potassium chloride SA (KLOR-CON) CR tablet 40 mEq (40 mEq Oral Given 04/15/20 0104)  fentaNYL (SUBLIMAZE) injection 25  mcg (25 mcg Intravenous Given 04/15/20 0244)  cefTRIAXone (ROCEPHIN) 1 g in sodium chloride 0.9 % 100 mL IVPB (0 g Intravenous Stopped 04/15/20 0410)    ED Course  I have reviewed the triage vital signs and the nursing notes.  Pertinent labs & imaging results that were available during my care of the patient were reviewed by me and considered in my medical decision making (see chart for details).  Clinical Course as of Apr 15 636  Thu Apr 15, 2020  0048 29 y/o patient with ileostomy bag due to colon cancer, chronic hypokalemia, CKD, chronic abdominal pain sent by provider for elevated WBC count. On exam patient appears well and reports feeling her baseline with her added chronic abdominal pain. No signs of infection on exam including normal appearing port site and ileostomy output. No urinary symptoms or URI symptoms. Her potassium and renal function are baseline for her. Magnesium normal. She is tolerating PO fine. Plan to hydrate some, give oral K and pain control. WBC count is 15 today but has also had hx of luekocytosis. Urine pending.    [KM]  0259 Workup does reveal a UTI. Patient otherwise feels improved and at her baseline. She was given IV rocephin and agrees with plan for continuing outpatient treatment with PO antibiotics.    [KM]    Clinical Course User Index [KM] Kristine Royal   MDM Rules/Calculators/A&P  Based on review of vitals, medical screening exam, lab work and/or imaging, there does not appear to be an acute, emergent etiology for the patient's symptoms. Counseled pt on good return precautions and encouraged both PCP and ED follow-up as needed.  Prior to discharge, I also discussed incidental imaging findings with patient in detail and advised appropriate, recommended follow-up in detail.  Clinical Impression: 1. Acute cystitis without hematuria     Disposition: Discharge  Prior to providing a prescription for a controlled substance, I  independently reviewed the patient's recent prescription history on the [place]. The patient had no recent or regular prescriptions and was deemed appropriate for a brief, less than 3 day prescription of narcotic for acute analgesia.  This note was prepared with assistance of Systems analyst. Occasional wrong-word or sound-a-like substitutions may have occurred due to the inherent limitations of voice recognition software.  Final Clinical Impression(s) / ED Diagnoses Final diagnoses:  Acute cystitis without hematuria    Rx / DC Orders ED Discharge Orders         Ordered    cephALEXin (KEFLEX) 500 MG capsule  2 times daily     04/15/20 0354           Alveria Apley, PA-C 04/15/20 1188    Daleen Bo, MD 04/15/20 1049

## 2020-04-15 NOTE — Discharge Instructions (Signed)
You were seen today for elevated white count. You appear to have a UTI. We have given your IV fluids and IV antibiotics and will send a prescription for an antibiotic. The rest of your labs looked at your baseline. Thank you for allowing me to care for you today. Please return to the emergency department if you have new or worsening symptoms. Take your medications as instructed.

## 2020-04-17 LAB — URINE CULTURE: Culture: >=100000 — AB

## 2020-05-16 ENCOUNTER — Emergency Department (HOSPITAL_COMMUNITY)
Admission: EM | Admit: 2020-05-16 | Discharge: 2020-05-16 | Disposition: A | Discharge status: Home / Self Care | Payer: PRIVATE HEALTH INSURANCE | Attending: Emergency Medicine | Admitting: Emergency Medicine

## 2020-05-16 ENCOUNTER — Encounter (HOSPITAL_COMMUNITY): Payer: Self-pay | Admitting: Emergency Medicine

## 2020-05-16 ENCOUNTER — Other Ambulatory Visit: Payer: Self-pay

## 2020-05-16 DIAGNOSIS — R112 Nausea with vomiting, unspecified: Secondary | ICD-10-CM | POA: Diagnosis not present

## 2020-05-16 DIAGNOSIS — R7989 Other specified abnormal findings of blood chemistry: Secondary | ICD-10-CM

## 2020-05-16 DIAGNOSIS — R109 Unspecified abdominal pain: Secondary | ICD-10-CM

## 2020-05-16 DIAGNOSIS — R000 Tachycardia, unspecified: Secondary | ICD-10-CM | POA: Diagnosis not present

## 2020-05-16 DIAGNOSIS — R1084 Generalized abdominal pain: Secondary | ICD-10-CM | POA: Insufficient documentation

## 2020-05-16 DIAGNOSIS — E860 Dehydration: Secondary | ICD-10-CM

## 2020-05-16 DIAGNOSIS — E876 Hypokalemia: Secondary | ICD-10-CM

## 2020-05-16 LAB — COMPREHENSIVE METABOLIC PANEL
ALT: 12 U/L (ref 0–44)
AST: 13 U/L — ABNORMAL LOW (ref 15–41)
Albumin: 4.3 g/dL (ref 3.5–5.0)
Alkaline Phosphatase: 74 U/L (ref 38–126)
Anion gap: 10 (ref 5–15)
BUN: 27 mg/dL — ABNORMAL HIGH (ref 6–20)
CO2: 16 mmol/L — ABNORMAL LOW (ref 22–32)
Calcium: 8.6 mg/dL — ABNORMAL LOW (ref 8.9–10.3)
Chloride: 95 mmol/L — ABNORMAL LOW (ref 98–111)
Creatinine, Ser: 2.73 mg/dL — ABNORMAL HIGH (ref 0.44–1.00)
GFR calc Af Amer: 26 mL/min — ABNORMAL LOW (ref >60)
GFR calc non Af Amer: 22 mL/min — ABNORMAL LOW (ref >60)
Glucose, Bld: 104 mg/dL — ABNORMAL HIGH (ref 70–99)
Potassium: 2.5 mmol/L — CL (ref 3.5–5.1)
Sodium: 121 mmol/L — ABNORMAL LOW (ref 135–145)
Total Bilirubin: 0.5 mg/dL (ref 0.3–1.2)
Total Protein: 8.5 g/dL — ABNORMAL HIGH (ref 6.5–8.1)

## 2020-05-16 LAB — URINALYSIS, ROUTINE W REFLEX MICROSCOPIC
Bilirubin Urine: NEGATIVE
Glucose, UA: NEGATIVE mg/dL
Ketones, ur: NEGATIVE mg/dL
Leukocytes,Ua: NEGATIVE
Nitrite: NEGATIVE
Protein, ur: NEGATIVE mg/dL
Specific Gravity, Urine: 1.004 — ABNORMAL LOW (ref 1.005–1.030)
pH: 6 (ref 5.0–8.0)

## 2020-05-16 LAB — CBC WITH DIFFERENTIAL/PLATELET
Abs Immature Granulocytes: 0.07 10*3/uL (ref 0.00–0.07)
Basophils Absolute: 0.1 10*3/uL (ref 0.0–0.1)
Basophils Relative: 1 %
Eosinophils Absolute: 0.2 10*3/uL (ref 0.0–0.5)
Eosinophils Relative: 2 %
HCT: 36.5 % (ref 36.0–46.0)
Hemoglobin: 12.3 g/dL (ref 12.0–15.0)
Immature Granulocytes: 1 %
Lymphocytes Relative: 19 %
Lymphs Abs: 1.7 10*3/uL (ref 0.7–4.0)
MCH: 27.5 pg (ref 26.0–34.0)
MCHC: 33.7 g/dL (ref 30.0–36.0)
MCV: 81.7 fL (ref 80.0–100.0)
Monocytes Absolute: 0.8 10*3/uL (ref 0.1–1.0)
Monocytes Relative: 9 %
Neutro Abs: 6.4 10*3/uL (ref 1.7–7.7)
Neutrophils Relative %: 68 %
Platelets: 358 10*3/uL (ref 150–400)
RBC: 4.47 MIL/uL (ref 3.87–5.11)
RDW: 14.8 % (ref 11.5–15.5)
WBC: 9.2 10*3/uL (ref 4.0–10.5)
nRBC: 0 % (ref 0.0–0.2)

## 2020-05-16 LAB — LIPASE, BLOOD: Lipase: 51 U/L (ref 11–51)

## 2020-05-16 LAB — I-STAT BETA HCG BLOOD, ED (MC, WL, AP ONLY): I-stat hCG, quantitative: 5.5 m[IU]/mL — ABNORMAL HIGH (ref <5)

## 2020-05-16 MED ORDER — SODIUM CHLORIDE 0.9 % IV BOLUS
1000.0000 mL | Freq: Once | INTRAVENOUS | Status: AC
  Administered 2020-05-16: 1000 mL via INTRAVENOUS

## 2020-05-16 MED ORDER — SODIUM CHLORIDE 0.9% FLUSH
3.0000 mL | Freq: Once | INTRAVENOUS | Status: AC
  Administered 2020-05-16: 3 mL via INTRAVENOUS

## 2020-05-16 MED ORDER — POTASSIUM CHLORIDE 10 MEQ/100ML IV SOLN
10.0000 meq | INTRAVENOUS | Status: AC
  Administered 2020-05-16 (×2): 10 meq via INTRAVENOUS
  Filled 2020-05-16: qty 100

## 2020-05-16 MED ORDER — ONDANSETRON 4 MG PO TBDP
4.0000 mg | ORAL_TABLET | Freq: Once | ORAL | Status: AC | PRN
  Administered 2020-05-16: 4 mg via ORAL
  Filled 2020-05-16 (×2): qty 1

## 2020-05-16 MED ORDER — HEPARIN SOD (PORK) LOCK FLUSH 100 UNIT/ML IV SOLN
500.0000 [IU] | Freq: Once | INTRAVENOUS | Status: AC
  Administered 2020-05-16: 500 [IU]
  Filled 2020-05-16: qty 5

## 2020-05-16 MED ORDER — FENTANYL CITRATE (PF) 100 MCG/2ML IJ SOLN
50.0000 ug | Freq: Once | INTRAMUSCULAR | Status: AC
  Administered 2020-05-16: 50 ug via INTRAVENOUS
  Filled 2020-05-16: qty 2

## 2020-05-16 MED ORDER — MORPHINE SULFATE (PF) 4 MG/ML IV SOLN
4.0000 mg | Freq: Once | INTRAVENOUS | Status: AC
  Administered 2020-05-16: 4 mg via INTRAVENOUS
  Filled 2020-05-16: qty 1

## 2020-05-16 NOTE — Discharge Instructions (Addendum)
Continue taking her medications as prescribed. Follow-up with your nephrologist for further evaluation management of your worsening kidney function. Make sure you are staying well-hydrated water. Return to the emergency room if you develop fevers, persistent vomiting, severe worsening abdominal pain, or any new, worsening, or concerning symptoms.

## 2020-05-16 NOTE — ED Provider Notes (Signed)
[facility] DEPT Provider Note   CSN: [id number] Arrival date & time: 05/16/20  0411     History Chief Complaint  Patient presents with  . Abdominal Pain  . Emesis  . Hypotension    Cindy [name] is a 29 y.o. female presenting for evaluation of nausea, vomiting, abdominal pain, and high colostomy output.  Patient states 2 days ago she ate at [employer], and immediately started to feel poorly.  She was able to control her symptoms with fluids and at home medication until today, when pain worsened.  She started vomiting, has had about 6 episodes, nonbloody nonbilious.  She also reports higher than normal gastric output through her ileostomy.  She denies change in color or texture of the ileostomy.  She denies fevers, chills, chest pain, shortness of breath, or abnormal urination.  She denies sick contacts.  She has a history of similar symptoms, often able to be treated with fluids.  History of frequent hypokalemia requiring IV infusions.    Additional history obtained from chart review.  Patient with a history of colon adenoma s/p resection and subsequent placement of ileostomy in 2014/2015.  Additional history of short gut syndrome, CKD stage III, frequent hypokalemia. Followed by nephrology at [facility].   HPI     Past Medical History:  Diagnosis Date  . Bilateral flank pain    due to ureteral stents  . Chronic hypokalemia   . CKD (chronic kidney disease), stage II   . 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 Complication of anesthesia    post op aspiration pneumonia w/ lap. appy 12-23-2007 (emergent ruptured appendix)  . FAP (familial adenomatous polyposis) 09/30/2014  . Heart murmur   . History of acute renal failure    2015;  2016  . History of  fetal demise, not currently pregnant    03-22-2013  stillborn at 25 wks  . History of kidney stones   . History of sepsis    admitted 12-03-2016 (post-op day 2 w/ bilateral ureteral stents and stone manipluation) discharged 12-06-2016  for urosepsis  . Hypomagnesemia   . Ileostomy in place [facility])   . Iron deficiency anemia   . Nephrolithiasis    BILATERAL   . Normocytic anemia   . Renal cyst, left    PER CT 11-12-2016  . Urgency of urination     Patient Active Problem List   Diagnosis Date Noted  . Lower abdominal pain 07/13/2018  . Elevated lipase 07/13/2018  . AKI (acute kidney injury) ([place]) 09/02/2017  . Dehydration 06/07/2017  . Sepsis, unspecified organism ([place]) 12/03/2016  . 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 Normocytic anemia 05/02/2015  . Hypotension   . High output ileostomy ([place]) 09/30/2014  . FAP (familial adenomatous polyposis) 09/30/2014  . Nephrolithiasis 09/30/2014  . Adenocarcinoma of colon ([place]) 05/26/2013  . Nausea with vomiting 05/21/2013  . Microcytic anemia 04/25/2013  . Hypokalemia 04/25/2013    Past Surgical History:  Procedure Laterality Date  . COLONOSCOPY N/A 05/24/2013   Procedure: COLONOSCOPY;  Surgeon: Missy Sabins, MD;  Location: [facility];  Service: Endoscopy;  Laterality: N/A;  . COMPLETION PROTECTOMY AND REVISION ILEOSTOMY/ LYSIS ADHESIONS  07-28-2014    [facility]  . CYSTOSCOPY W/ RETROGRADES Right 12/15/2016   Procedure: CYSTOSCOPY  WITH RETROGRADE PYELOGRAM;  Surgeon: Alexis Frock, MD;  Location: [facility];  Service: Urology;  Laterality: Right;  . CYSTOSCOPY W/ URETERAL STENT PLACEMENT Bilateral 11/01/2016   Procedure: CYSTOSCOPY WITH BILATERAL RETROGRADE PYELOGRAM BILATERAL URETERAL STENT PLACEMENT;  Surgeon: Alexis Frock, MD;  Location: [facility];  Service: Urology;  Laterality: Bilateral;  . CYSTOSCOPY W/ URETERAL STENT PLACEMENT Right 12/15/2016   Procedure: CYSTOSCOPY WITH STENT REPLACEMENT;  Surgeon:  Alexis Frock, MD;  Location: [facility];  Service: Urology;  Laterality: Right;  . CYSTOSCOPY W/ URETERAL STENT REMOVAL Bilateral 12/15/2016   Procedure: CYSTOSCOPY WITH STENT REMOVAL;  Surgeon: Alexis Frock, MD;  Location: [facility];  Service: Urology;  Laterality: Bilateral;  . CYSTOSCOPY WITH URETEROSCOPY Bilateral 12/01/2016   Procedure: FIRST STAGE CYSTOSCOPY WITH URETEROSCOPY,  STONE MANIPULATION and BASKETRY, STENT EXCHANGE;  Surgeon: Alexis Frock, MD;  Location: [facility];  Service: Urology;  Laterality: Bilateral;  . CYSTOSCOPY WITH URETEROSCOPY Right 12/15/2016   Procedure: SECOND STAGE -CYSTOSCOPY WITH URETEROSCOPY AND STONE EXTRACTION WITH BASKET;  Surgeon: Alexis Frock, MD;  Location: [facility];  Service: Urology;  Laterality: Right;  . HOLMIUM LASER APPLICATION Bilateral 02/01/9023   Procedure: HOLMIUM LASER APPLICATION;  Surgeon: Alexis Frock, MD;  Location: [facility];  Service: Urology;  Laterality: Bilateral;  . HOLMIUM LASER APPLICATION Right 0/97/3532   Procedure: HOLMIUM LASER APPLICATION;  Surgeon: Alexis Frock, MD;  Location: [facility];  Service: Urology;  Laterality: Right;  . I & D EXTREMITY Left 11/14/2013   Procedure: IRRIGATION AND DEBRIDEMENT LEFT LONG FINGER;  Surgeon: Tennis Must, MD;  Location: [facility];  Service: Orthopedics;  Laterality: Left;  . IM NAILING TIBIA Left 08/23/2010  . LAPAROSCOPIC APPENDECTOMY  12/23/2007  . LEG RECONSTRUCTION USING FASCIAL FLAP  ~ 2009  . SUBTOTAL COLECTOMY /  RESECTION ENBLOC DISTAL ILEUM/  ILEOSTOMY  07/ 2014  [facility]  . TRANSTHORACIC ECHOCARDIOGRAM  11/11/2011   ef 55-60%/  mild MR/  trivial PR and TR/  PASP 29-25mmHg/  trivial pericardial effusion identified     OB History    Gravida  1   Para  1   Term  0   Preterm  1   AB  0   Living        SAB  0   TAB  0   Ectopic  0   Multiple        Live Births              Family History  Adopted: Yes  Problem Relation Age of Onset  . ALS Mother   . Healthy Father   . Alcohol abuse Neg Hx   . Arthritis Neg Hx   . Asthma Neg Hx   . Birth defects Neg Hx   . Cancer Neg Hx   . COPD Neg Hx   . Depression Neg Hx   . Diabetes Neg Hx   . Drug abuse Neg Hx   . Early death Neg Hx   . Hearing loss Neg Hx   . Heart disease Neg Hx   . Hyperlipidemia Neg Hx   . Hypertension Neg Hx   . Kidney disease Neg Hx   . Learning disabilities Neg Hx   . Mental illness Neg Hx   . Mental retardation Neg Hx   . Miscarriages / Stillbirths Neg Hx   . Stroke Neg Hx   . Vision loss Neg Hx  Social History   Tobacco Use  . Smoking status: Never Smoker  . Smokeless tobacco: Never Used  Vaping Use  . Vaping Use: Never used  Substance Use Topics  . Alcohol use: No  . Drug use: No    Home Medications Prior to Admission medications   Medication Sig Start Date End Date Taking? Authorizing Provider  acetaminophen (TYLENOL) 325 MG tablet Take 650 mg by mouth every 6 (six) hours as needed for mild pain or headache.   Yes [provider]  medroxyPROGESTERone (DEPO-PROVERA) 150 MG/ML injection Inject 150 mg into the muscle every 3 (three) months.   Yes [provider]  Multiple Vitamins-Minerals (MULTIVITAMIN GUMMIES ADULT) CHEW Chew 2 each by mouth daily.    Yes [provider]  potassium chloride SA (KLOR-CON) 20 MEQ tablet Take 20 mEq by mouth 2 (two) times daily.   Yes [provider]  cetirizine (ZYRTEC) 10 MG tablet Take 1 tablet (10 mg total) by mouth daily. Patient not taking: Reported on 12/16/2019 07/09/19   Loura Halt A, NP  cyanocobalamin (,VITAMIN B-12,) 1000 MCG/ML injection Inject 1,000 mcg into the muscle once a week.     [provider]  Vitamin D, Ergocalciferol, (DRISDOL) 50000 units CAPS capsule Take 50,000 Units by mouth every Monday.  05/22/16   [provider]     Allergies    Food, Iron, Venofer [ferric oxide], Soap, Morphine and related, Sulfa antibiotics, Sulfamethoxazole-trimethoprim, and Levaquin [levofloxacin]  Review of Systems   Review of Systems  Gastrointestinal: Positive for abdominal pain, nausea and vomiting.  All other systems reviewed and are negative.   Physical Exam Updated Vital Signs BP 94/60 (BP Location: Right Arm)   Pulse 71   Temp 97.8 F (36.6 C) (Oral)   Resp (!) 22   Ht 5' (1.524 m)   Wt 41.7 kg   SpO2 100%   BMI 17.97 kg/m   Physical Exam Vitals and nursing note reviewed.  Constitutional:      General: She is not in acute distress.    Appearance: She is well-developed.     Comments: Appears nontoxic  HENT:     Head: Normocephalic and atraumatic.  Eyes:     Conjunctiva/sclera: Conjunctivae normal.     Pupils: Pupils are equal, round, and reactive to light.  Cardiovascular:     Rate and Rhythm: Regular rhythm. Tachycardia present.     Pulses: Normal pulses.     Comments: Mildly tachycardic between 105 and 110 Pulmonary:     Effort: Pulmonary effort is normal. No respiratory distress.     Breath sounds: Normal breath sounds. No wheezing.  Abdominal:     General: There is no distension.     Palpations: Abdomen is soft. There is no mass.     Tenderness: There is abdominal tenderness. There is no guarding or rebound.     Comments: Ileostomy noted without surrounding erythema or induration.  No focal tenderness around the ileostomy.  Generalized tenderness palpation of the abdomen.  No rigidity, guarding, distention.  Negative rebound.  Musculoskeletal:        General: Normal range of motion.     Cervical back: Normal range of motion and neck supple.  Skin:    General: Skin is warm and dry.     Capillary Refill: Capillary refill takes less than 2 seconds.  Neurological:     Mental Status: She is alert and oriented to person, place, and time.     ED Results / Procedures /  Treatments   Labs (all  labs ordered are listed, but only abnormal results are displayed) Labs Reviewed  CBC WITH DIFFERENTIAL/PLATELET  LIPASE, BLOOD  COMPREHENSIVE METABOLIC PANEL  URINALYSIS, ROUTINE W REFLEX MICROSCOPIC  I-STAT BETA HCG BLOOD, ED ([facility], [facility], [facility] ONLY)    EKG None  Radiology No results found.  Procedures Procedures (including critical care time)  Medications Ordered in ED Medications  sodium chloride flush (NS) 0.9 % injection 3 mL (3 mLs Intravenous Given 05/16/20 0534)  ondansetron (ZOFRAN-ODT) disintegrating tablet 4 mg (4 mg Oral Given 05/16/20 0533)  sodium chloride 0.9 % bolus 1,000 mL (1,000 mLs Intravenous New Bag/Given 05/16/20 0534)  morphine 4 MG/ML injection 4 mg (4 mg Intravenous Given 05/16/20 0533)    ED Course  I have reviewed the triage vital signs and the nursing notes.  Pertinent labs & imaging results that were available during my care of the patient were reviewed by me and considered in my medical decision making (see chart for details).    MDM Rules/Calculators/A&P                          Patient presenting for evaluation of nausea, vomiting, abdominal pain.  On exam, patient appears nontoxic.  She does have generalized tenderness to palpation the abdomen.  She has a history of multiple similar visits in the past several months. Has had negative CT scans with similar symptoms.  As such we will treat symptomatically and obtain labs and hold on CT at this time.  Patient is agreeable.  Of note, patient's blood pressure is soft, she states this is baseline.  She states blood pressure rarely goes above mid to upper 62I systolic.   Labs interpreted by me shows hypokalemia at 2.5, similar to pt's baseline, will give some IV potassium in the ED. SCr trending up, currently 2.7, baseline 1.7.  However not too far from patient's previous creatinine at 2.0.  Labs otherwise reassuring.  On reassessment, patient reports pain is improved.  Nausea and vomiting remains resolved.   Discussed option of admission versus symptomatic treatment in the ER and discharge.  Discussed patient's worsening creatinine, and if she does not get admitted, importance of follow-up with nephrology.  Based on shared decision making, will plan for p.o. challenge, second liter of fluids, and to monitor potassium.  Will recheck afterwards, and if patient is feeling well and tolerating p.o. without difficulty, plan for discharge.  If she has further vomiting or feels she is not safe to go home, consider admission.  Patient signed out to Serita Grit, PA-C for reevaluation after fluids, potassium and PO challenge.   Final Clinical Impression(s) / ED Diagnoses Final diagnoses:  None    Rx / DC Orders ED Discharge Orders    None       Franchot Heidelberg, PA-C 05/16/20 [id number]    Rolland Porter, MD 05/16/20 [phone]

## 2020-05-16 NOTE — ED Notes (Signed)
Date and time results received: 05/16/20 0705 (use smartphrase ".now" to insert current time)  Test: K+ Critical Value: 2.5  Name of Provider Notified: IVa knapp Md  Orders Received? Or Actions Taken?: waiting on orders

## 2020-05-16 NOTE — ED Notes (Signed)
Patient given PO food and fluids. Ambulated to RR and provided urine sample.

## 2020-05-16 NOTE — ED Provider Notes (Signed)
Care of the patient was assumed from S. Caccavale PA-C at 714; see this provider's note for complete history of present illness, review of systems, and physical exam.  Briefly, the patient is a 29 y.o. female who presented to the ED with nausea, vomiting, abdominal pain, high colostomy output.  History significant for colon adenoma s/p resection and subsequent placement of ileostomy in 2014/20015.  Also has history of short gut syndrome, CKD stage III, frequent hypokalemia and followed by nephrologist at [facility].  Plan at time of handoff: IV potassium, IV fluids, will reassess and then p.o. challenge.  If patient does not vomit she can go home, if patient does not feel better still feels nauseous and vomiting patient to be admitted for AKI and continuous nausea and vomiting.   Physical Exam  BP (!) 91/56    Pulse 86    Temp 97.8 F (36.6 C) (Oral)    Resp 13    Ht 5' (1.524 m)    Wt 41.7 kg    SpO2 100%    BMI 17.97 kg/m   Physical Exam Constitutional:      General: She is not in acute distress.    Appearance: Normal appearance. She is not ill-appearing, toxic-appearing or diaphoretic.  HENT:     Mouth/Throat:     Mouth: Mucous membranes are moist.     Pharynx: Oropharynx is clear.  Eyes:     General: No scleral icterus.    Extraocular Movements: Extraocular movements intact.     Pupils: Pupils are equal, round, and reactive to light.  Cardiovascular:     Rate and Rhythm: Normal rate and regular rhythm.     Pulses: Normal pulses.     Heart sounds: Normal heart sounds.  Pulmonary:     Effort: Pulmonary effort is normal. No respiratory distress.     Breath sounds: Normal breath sounds. No stridor. No wheezing, rhonchi or rales.  Chest:     Chest wall: No tenderness.  Abdominal:     General: Abdomen is flat. There is no distension.     Palpations: Abdomen is soft.     Tenderness: There is no abdominal tenderness. There is no guarding or rebound. Negative signs  include Murphy's sign, McBurney's sign and psoas sign.     Comments: Patient with ileostomy in right lower quadrant, no overlying signs of infection or induration.  Patient denies any abdominal pain currently.  No rebound or guarding, no signs of surgical abdomen.  Musculoskeletal:        General: No swelling or tenderness. Normal range of motion.     Cervical back: Normal range of motion and neck supple. No rigidity.     Right lower leg: No edema.     Left lower leg: No edema.  Skin:    General: Skin is warm and dry.     Capillary Refill: Capillary refill takes less than 2 seconds.     Coloration: Skin is not pale.  Neurological:     General: No focal deficit present.     Mental Status: She is alert and oriented to person, place, and time.  Psychiatric:        Mood and Affect: Mood normal.        Behavior: Behavior normal.     ED Course/Procedures     Procedures  MDM   When assessing patient, patient denies any more tenderness to her abdomen, abdomen without any peritoneal signs.  Patient is ambulatory.  Denies any chest  pain or shortness of breath.  Patient has been seen 8 times in the last 6 months for the same.  Patient states that she feels similar to whenever this happens her, however this time she feels a lot better after IV fluids.  Engaged in shared decision-making about to patient about coming in to the hospital due to lab values and AKI, patient states that she will do IV fluids at home.  Did speak to her about the risks of this, patient states that she would rather leave.  Does not want repeat BMP as she states that she will see her nephrologist tomorrow.  CBC without any leukocytosis or anemia.  CMP with chronic abnormalities.  Patient passed p.o. challenge with water and peanutbutter. Patient wants to leave.  Patient states that she will follow up with [facility] nephrology.Patient to be discharged with close follow-up.  Asked patient she wanted me to prescribe her oral  potassium, patient states that she does not want this at this time and has chronic potassium prescription.  Patient states that she is ready to go home.  Doubt need for further emergent work up at this time. I explained the diagnosis and have given explicit precautions to return to the ER including for any other new or worsening symptoms. The patient understands and accepts the medical plan as it's been dictated and I have answered their questions. Discharge instructions concerning home care and prescriptions have been given. The patient is STABLE and is discharged to home in good condition.     Cindy Client, PA-C 05/16/20 1518    Malvin Johns, MD 05/19/20 [phone]

## 2020-05-16 NOTE — ED Triage Notes (Addendum)
Patient states she's been having abdominal pain since yesterday, and today it woke her up out of her sleep. Patient also states that she's vomiting. Patient does have an ileostomy and she states her output has been higher than normal.

## 2020-06-22 ENCOUNTER — Emergency Department (HOSPITAL_BASED_OUTPATIENT_CLINIC_OR_DEPARTMENT_OTHER)
Admission: EM | Admit: 2020-06-22 | Discharge: 2020-06-22 | Discharge status: Against Medical Advice | Payer: PRIVATE HEALTH INSURANCE

## 2020-06-22 ENCOUNTER — Other Ambulatory Visit: Payer: Self-pay

## 2020-07-06 ENCOUNTER — Encounter (HOSPITAL_BASED_OUTPATIENT_CLINIC_OR_DEPARTMENT_OTHER): Payer: Self-pay | Admitting: Emergency Medicine

## 2020-07-06 ENCOUNTER — Other Ambulatory Visit: Payer: Self-pay

## 2020-07-06 ENCOUNTER — Emergency Department (HOSPITAL_BASED_OUTPATIENT_CLINIC_OR_DEPARTMENT_OTHER): Payer: PRIVATE HEALTH INSURANCE

## 2020-07-06 ENCOUNTER — Emergency Department (HOSPITAL_BASED_OUTPATIENT_CLINIC_OR_DEPARTMENT_OTHER)
Admission: EM | Admit: 2020-07-06 | Discharge: 2020-07-06 | Disposition: A | Discharge status: Home / Self Care | Payer: PRIVATE HEALTH INSURANCE | Attending: Emergency Medicine | Admitting: Emergency Medicine

## 2020-07-06 DIAGNOSIS — N179 Acute kidney failure, unspecified: Secondary | ICD-10-CM | POA: Diagnosis not present

## 2020-07-06 DIAGNOSIS — Z79899 Other long term (current) drug therapy: Secondary | ICD-10-CM | POA: Diagnosis not present

## 2020-07-06 DIAGNOSIS — Z85038 Personal history of other malignant neoplasm of large intestine: Secondary | ICD-10-CM | POA: Insufficient documentation

## 2020-07-06 DIAGNOSIS — E876 Hypokalemia: Secondary | ICD-10-CM | POA: Insufficient documentation

## 2020-07-06 DIAGNOSIS — R109 Unspecified abdominal pain: Secondary | ICD-10-CM | POA: Diagnosis present

## 2020-07-06 DIAGNOSIS — N182 Chronic kidney disease, stage 2 (mild): Secondary | ICD-10-CM | POA: Insufficient documentation

## 2020-07-06 LAB — CBC WITH DIFFERENTIAL/PLATELET
Abs Immature Granulocytes: 0.06 10*3/uL (ref 0.00–0.07)
Basophils Absolute: 0.1 10*3/uL (ref 0.0–0.1)
Basophils Relative: 0 %
Eosinophils Absolute: 0.1 10*3/uL (ref 0.0–0.5)
Eosinophils Relative: 1 %
HCT: 33.2 % — ABNORMAL LOW (ref 36.0–46.0)
Hemoglobin: 10.9 g/dL — ABNORMAL LOW (ref 12.0–15.0)
Immature Granulocytes: 0 %
Lymphocytes Relative: 17 %
Lymphs Abs: 2.4 10*3/uL (ref 0.7–4.0)
MCH: 28.3 pg (ref 26.0–34.0)
MCHC: 32.8 g/dL (ref 30.0–36.0)
MCV: 86.2 fL (ref 80.0–100.0)
Monocytes Absolute: 1 10*3/uL (ref 0.1–1.0)
Monocytes Relative: 7 %
Neutro Abs: 10.1 10*3/uL — ABNORMAL HIGH (ref 1.7–7.7)
Neutrophils Relative %: 75 %
Platelets: 405 10*3/uL — ABNORMAL HIGH (ref 150–400)
RBC: 3.85 MIL/uL — ABNORMAL LOW (ref 3.87–5.11)
RDW: 12.6 % (ref 11.5–15.5)
WBC: 13.6 10*3/uL — ABNORMAL HIGH (ref 4.0–10.5)
nRBC: 0 % (ref 0.0–0.2)

## 2020-07-06 LAB — COMPREHENSIVE METABOLIC PANEL
ALT: 32 U/L (ref 0–44)
AST: 27 U/L (ref 15–41)
Albumin: 4 g/dL (ref 3.5–5.0)
Alkaline Phosphatase: 99 U/L (ref 38–126)
Anion gap: 16 — ABNORMAL HIGH (ref 5–15)
BUN: 31 mg/dL — ABNORMAL HIGH (ref 6–20)
CO2: 22 mmol/L (ref 22–32)
Calcium: 9.7 mg/dL (ref 8.9–10.3)
Chloride: 91 mmol/L — ABNORMAL LOW (ref 98–111)
Creatinine, Ser: 3.16 mg/dL — ABNORMAL HIGH (ref 0.44–1.00)
GFR calc Af Amer: 22 mL/min — ABNORMAL LOW (ref >60)
GFR calc non Af Amer: 19 mL/min — ABNORMAL LOW (ref >60)
Glucose, Bld: 108 mg/dL — ABNORMAL HIGH (ref 70–99)
Potassium: 3.2 mmol/L — ABNORMAL LOW (ref 3.5–5.1)
Sodium: 129 mmol/L — ABNORMAL LOW (ref 135–145)
Total Bilirubin: 0.5 mg/dL (ref 0.3–1.2)
Total Protein: 9.1 g/dL — ABNORMAL HIGH (ref 6.5–8.1)

## 2020-07-06 LAB — URINALYSIS, ROUTINE W REFLEX MICROSCOPIC
Bilirubin Urine: NEGATIVE
Glucose, UA: NEGATIVE mg/dL
Hgb urine dipstick: NEGATIVE
Ketones, ur: NEGATIVE mg/dL
Leukocytes,Ua: NEGATIVE
Nitrite: NEGATIVE
Protein, ur: NEGATIVE mg/dL
Specific Gravity, Urine: 1.025 (ref 1.005–1.030)
pH: 5 (ref 5.0–8.0)

## 2020-07-06 LAB — LIPASE, BLOOD: Lipase: 46 U/L (ref 11–51)

## 2020-07-06 LAB — PREGNANCY, URINE: Preg Test, Ur: NEGATIVE

## 2020-07-06 LAB — MAGNESIUM: Magnesium: 2 mg/dL (ref 1.7–2.4)

## 2020-07-06 MED ORDER — LACTATED RINGERS IV BOLUS
1000.0000 mL | Freq: Once | INTRAVENOUS | Status: AC
  Administered 2020-07-06: 1000 mL via INTRAVENOUS

## 2020-07-06 MED ORDER — CEPHALEXIN 250 MG PO CAPS
250.0000 mg | ORAL_CAPSULE | Freq: Three times a day (TID) | ORAL | 0 refills | Status: AC
Start: 2020-07-06 — End: 2020-07-13

## 2020-07-06 MED ORDER — FENTANYL CITRATE (PF) 100 MCG/2ML IJ SOLN
50.0000 ug | Freq: Once | INTRAMUSCULAR | Status: AC
  Administered 2020-07-06: 50 ug via INTRAVENOUS
  Filled 2020-07-06: qty 2

## 2020-07-06 MED ORDER — POTASSIUM CHLORIDE CRYS ER 20 MEQ PO TBCR
40.0000 meq | EXTENDED_RELEASE_TABLET | Freq: Once | ORAL | Status: AC
  Administered 2020-07-06: 40 meq via ORAL
  Filled 2020-07-06: qty 2

## 2020-07-06 NOTE — ED Notes (Signed)
Pt states her blood pressure normally "runs low" states unable to provide urine at this time

## 2020-07-06 NOTE — ED Notes (Signed)
Pt IV fluids reconnected

## 2020-07-06 NOTE — ED Notes (Signed)
ED Provider at bedside discussing plan of care. 

## 2020-07-06 NOTE — ED Notes (Signed)
MD with pt  

## 2020-07-06 NOTE — ED Triage Notes (Signed)
Pt is c/o abd pain that started yesterday afternoon  Pt states the pain is in the middle  Has nausea without vomiting or diarrhea

## 2020-07-06 NOTE — ED Notes (Signed)
ED Provider at bedside. 

## 2020-07-06 NOTE — ED Notes (Signed)
Peripheral IV started rather than  Using implanted port due to redness around port site in right upper chest.

## 2020-07-06 NOTE — ED Provider Notes (Addendum)
[facility] EMERGENCY DEPARTMENT Provider Note   CSN: [id number] Arrival date & time: 07/06/20  0431     History Chief Complaint  Patient presents with  . Abdominal Pain    Cindy [name] is a 29 y.o. female.  HPI 29 year old female presents with abdominal pain. This started yesterday and is all across her abdomen.  This feels very similar to multiple prior abdominal pains.  She states that usually happens when she gets dehydrated.  She often gets IV fluids through her port but recently has not been able to do this.  She denies any high output diarrhea or any vomiting.  No fever or urinary symptoms.   Past Medical History:  Diagnosis Date  . Bilateral flank pain    due to ureteral stents  . Chronic hypokalemia   . CKD (chronic kidney disease), stage II   . 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 Complication of anesthesia    post op aspiration pneumonia w/ lap. appy 12-23-2007 (emergent ruptured appendix)  . FAP (familial adenomatous polyposis) 09/30/2014  . Heart murmur   . History of acute renal failure    2015;  2016  . History of fetal demise, not currently pregnant    03-22-2013  stillborn at 54 wks  . History of kidney stones   . History of sepsis    admitted 12-03-2016 (post-op day 2 w/ bilateral ureteral stents and stone manipluation) discharged 12-06-2016  for urosepsis  . Hypomagnesemia   . Ileostomy in place [facility])   . Iron deficiency anemia   . Nephrolithiasis    BILATERAL   . Normocytic anemia   . Renal cyst, left    PER CT 11-12-2016  . Urgency of urination     Patient Active Problem List   Diagnosis Date Noted  . Lower abdominal pain 07/13/2018  . Elevated lipase 07/13/2018  . AKI (acute kidney injury) ([place]) 09/02/2017  . Dehydration 06/07/2017  .  Sepsis, unspecified organism ([place]) 12/03/2016  . 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 Normocytic anemia 05/02/2015  . Hypotension   . 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 FAP (familial adenomatous polyposis) 09/30/2014  . Nephrolithiasis 09/30/2014  . Adenocarcinoma of colon ([place]) 05/26/2013  . Nausea with vomiting 05/21/2013  . Microcytic anemia 04/25/2013  . Hypokalemia 04/25/2013    Past Surgical History:  Procedure Laterality Date  . COLONOSCOPY N/A 05/24/2013   Procedure: COLONOSCOPY;  Surgeon: Missy Sabins, MD;  Location: [facility];  Service: Endoscopy;  Laterality: N/A;  . COMPLETION PROTECTOMY AND REVISION ILEOSTOMY/ LYSIS ADHESIONS  07-28-2014    [facility]  . CYSTOSCOPY W/ RETROGRADES Right 12/15/2016   Procedure: CYSTOSCOPY WITH RETROGRADE PYELOGRAM;  Surgeon: Alexis Frock, MD;  Location: [facility];  Service: Urology;  Laterality: Right;  . CYSTOSCOPY W/ URETERAL STENT PLACEMENT Bilateral 11/01/2016   Procedure: CYSTOSCOPY WITH BILATERAL RETROGRADE PYELOGRAM BILATERAL URETERAL STENT PLACEMENT;  Surgeon: Alexis Frock, MD;  Location: [facility];  Service: Urology;  Laterality: Bilateral;  . CYSTOSCOPY W/ URETERAL STENT PLACEMENT Right 12/15/2016   Procedure: CYSTOSCOPY WITH STENT REPLACEMENT;  Surgeon: Alexis Frock, MD;  Location: [facility];  Service: Urology;  Laterality: Right;  . CYSTOSCOPY W/ URETERAL STENT REMOVAL Bilateral 12/15/2016   Procedure: CYSTOSCOPY WITH STENT REMOVAL;  Surgeon: Alexis Frock, MD;  Location: [facility];  Service: Urology;  Laterality: Bilateral;  . CYSTOSCOPY WITH URETEROSCOPY Bilateral 12/01/2016   Procedure: FIRST STAGE CYSTOSCOPY WITH URETEROSCOPY,  STONE MANIPULATION and BASKETRY, STENT EXCHANGE;  Surgeon: Alexis Frock, MD;  Location: [facility];  Service: Urology;  Laterality: Bilateral;  . CYSTOSCOPY WITH URETEROSCOPY Right 12/15/2016   Procedure: SECOND STAGE  -CYSTOSCOPY WITH URETEROSCOPY AND STONE EXTRACTION WITH BASKET;  Surgeon: Alexis Frock, MD;  Location: [facility];  Service: Urology;  Laterality: Right;  . HOLMIUM LASER APPLICATION Bilateral 03/27/8840   Procedure: HOLMIUM LASER APPLICATION;  Surgeon: Alexis Frock, MD;  Location: [facility];  Service: Urology;  Laterality: Bilateral;  . HOLMIUM LASER APPLICATION Right 6/60/6301   Procedure: HOLMIUM LASER APPLICATION;  Surgeon: Alexis Frock, MD;  Location: [facility];  Service: Urology;  Laterality: Right;  . I & D EXTREMITY Left 11/14/2013   Procedure: IRRIGATION AND DEBRIDEMENT LEFT LONG FINGER;  Surgeon: Tennis Must, MD;  Location: [facility];  Service: Orthopedics;  Laterality: Left;  . IM NAILING TIBIA Left 08/23/2010  . LAPAROSCOPIC APPENDECTOMY  12/23/2007  . LEG RECONSTRUCTION USING FASCIAL FLAP  ~ 2009  . SUBTOTAL COLECTOMY /  RESECTION ENBLOC DISTAL ILEUM/  ILEOSTOMY  07/ 2014  [facility]  . TRANSTHORACIC ECHOCARDIOGRAM  11/11/2011   ef 55-60%/  mild MR/  trivial PR and TR/  PASP 29-2mmHg/  trivial pericardial effusion identified     OB History    Gravida  1   Para  1   Term  0   Preterm  1   AB  0   Living        SAB  0   TAB  0   Ectopic  0   Multiple      Live Births              Family History  Adopted: Yes  Problem Relation Age of Onset  . ALS Mother   . Healthy Father   . Alcohol abuse Neg Hx   . Arthritis Neg Hx   . Asthma Neg Hx   . Birth defects Neg Hx   . Cancer Neg Hx   . COPD Neg Hx   . Depression Neg Hx   . Diabetes Neg Hx   . Drug abuse Neg Hx   . Early death Neg Hx   . Hearing loss Neg Hx   . Heart disease Neg Hx   . Hyperlipidemia Neg Hx   . Hypertension Neg Hx   . Kidney disease Neg Hx   . Learning disabilities Neg Hx   . Mental illness Neg Hx   . Mental retardation Neg Hx   . Miscarriages / Stillbirths Neg Hx   . Stroke Neg Hx   . Vision loss Neg Hx      Social History   Tobacco Use  . Smoking status: Never Smoker  . Smokeless tobacco: Never Used  Vaping Use  . Vaping Use: Never used  Substance Use Topics  . Alcohol use: No  . Drug use: No    Home Medications Prior to Admission medications   Medication Sig Start Date End Date Taking? Authorizing Provider  acetaminophen (TYLENOL) 325 MG tablet Take 650 mg by mouth every 6 (six) hours as needed for mild pain or headache.    [provider]  cetirizine (ZYRTEC) 10 MG tablet Take 1 tablet (10 mg total) by mouth daily.  Patient not taking: Reported on 12/16/2019 07/09/19   Loura Halt A, NP  cyanocobalamin (,VITAMIN B-12,) 1000 MCG/ML injection Inject 1,000 mcg into the muscle once a week.     [provider]  medroxyPROGESTERone (DEPO-PROVERA) 150 MG/ML injection Inject 150 mg into the muscle every 3 (three) months.    [provider]  Multiple Vitamins-Minerals (MULTIVITAMIN GUMMIES ADULT) CHEW Chew 2 each by mouth daily.     [provider]  potassium chloride SA (KLOR-CON) 20 MEQ tablet Take 20 mEq by mouth 2 (two) times daily.    [provider]  Vitamin D, Ergocalciferol, (DRISDOL) 50000 units CAPS capsule Take 50,000 Units by mouth every Monday.  05/22/16   [provider]    Allergies    Food, Iron, Venofer [ferric oxide], Soap, Morphine and related, Sulfa antibiotics, Sulfamethoxazole-trimethoprim, and Levaquin [levofloxacin]  Review of Systems   Review of Systems  Constitutional: Negative for fever.  Respiratory: Negative for cough.   Cardiovascular: Negative for chest pain.  Gastrointestinal: Positive for abdominal pain. Negative for diarrhea and vomiting.  Genitourinary: Negative for dysuria.  All other systems reviewed and are negative.   Physical Exam Updated Vital Signs BP (!) 83/57 (BP Location: Left Arm)   Pulse 76   Temp 98 F (36.7 C) (Oral)   Resp 16   Ht 5' (1.524 m)   Wt 43.1 kg   SpO2 100%   BMI  18.55 kg/m   Physical Exam Vitals and nursing note reviewed.  Constitutional:      General: She is not in acute distress.    Appearance: She is well-developed. She is not ill-appearing or diaphoretic.  HENT:     Head: Normocephalic and atraumatic.     Right Ear: External ear normal.     Left Ear: External ear normal.     Nose: Nose normal.  Eyes:     General:        Right eye: No discharge.        Left eye: No discharge.  Cardiovascular:     Rate and Rhythm: Normal rate and regular rhythm.     Heart sounds: Normal heart sounds.  Pulmonary:     Effort: Pulmonary effort is normal.     Breath sounds: Normal breath sounds.  Abdominal:     Palpations: Abdomen is soft.     Tenderness: There is generalized abdominal tenderness (mild).  Skin:    General: Skin is warm and dry.  Neurological:     Mental Status: She is alert.  Psychiatric:        Mood and Affect: Mood is not anxious.     ED Results / Procedures / Treatments   Labs (all labs ordered are listed, but only abnormal results are displayed) Labs Reviewed  COMPREHENSIVE METABOLIC PANEL - Abnormal; Notable for the following components:      Result Value   Sodium 129 (*)    Potassium 3.2 (*)    Chloride 91 (*)    Glucose, Bld 108 (*)    BUN 31 (*)    Creatinine, Ser 3.16 (*)    Total Protein 9.1 (*)    GFR calc non Af Amer 19 (*)    GFR calc Af Amer 22 (*)    Anion gap 16 (*)    All other components within normal limits  CBC WITH DIFFERENTIAL/PLATELET - Abnormal; Notable for the following components:   WBC 13.6 (*)    RBC 3.85 (*)    Hemoglobin 10.9 (*)  HCT 33.2 (*)    Platelets 405 (*)    Neutro Abs 10.1 (*)    All other components within normal limits  URINALYSIS, ROUTINE W REFLEX MICROSCOPIC  PREGNANCY, URINE  LIPASE, BLOOD  MAGNESIUM    EKG None  Radiology DG ABD ACUTE 2+V W 1V CHEST  Result Date: 07/06/2020 CLINICAL DATA:  Abdominal pain beginning yesterday. Nausea. Colon carcinoma. EXAM: DG  ABDOMEN ACUTE W/ 1V CHEST COMPARISON:  02/09/2020 FINDINGS: There is no evidence of dilated bowel loops or free intraperitoneal air. No radiopaque calculi or other significant radiographic abnormality is seen. An ostomy ring is seen in the right lower quadrant. Heart size and mediastinal contours are within normal limits. Power port is seen in appropriate position. Both lungs are clear. IMPRESSION: Unremarkable bowel gas pattern.  No acute findings. No active cardiopulmonary disease. Electronically Signed   By: Marlaine Hind M.D.   On: 07/06/2020 08:16    Procedures Procedures (including critical care time)  Medications Ordered in ED Medications  lactated ringers bolus 1,000 mL ( Intravenous Stopped 07/06/20 0831)  fentaNYL (SUBLIMAZE) injection 50 mcg (50 mcg Intravenous Given 07/06/20 0751)  potassium chloride SA (KLOR-CON) CR tablet 40 mEq (40 mEq Oral Given 07/06/20 0901)  lactated ringers bolus 1,000 mL (1,000 mLs Intravenous New Bag/Given 07/06/20 0858)    ED Course  I have reviewed the triage vital signs and the nursing notes.  Pertinent labs & imaging results that were available during my care of the patient were reviewed by me and considered in my medical decision making (see chart for details).    MDM Rules/Calculators/A&P                          Patient's blood pressure is normally in the 80s/90s and she is not symptomatic from this I do not think she has true hypotension.  Cr 1.7 was on 8/3 per outside records from [facility].  Today it is 3.16.  I recommended admission for further fluids and work-up/treatment.  She declines.  She seems understand that she could develop permanent kidney damage and adverse effects related to this.  I discussed this with her multiple times and she still wants to leave.  She was given 2 L IV fluids and was given oral potassium.  We discussed the need to call her nephrologist and that she can return anytime.  My suspicion of acute bowel obstruction is  pretty low.  Do not think emergent CT needed.  Of note, her right chest port does have a slight bit of redness associated with it.  It is not tender.  It was manipulated by surgery about a month and a half ago and the stitches came out fairly recently.  She states the redness has been going on for about a week but it is not painful or tender.  Unclear if this is really infectious.  Doubt abscess.  I will put her on renally adjusted Keflex as she is allergic to Bactrim and have her follow-up with her specialist. Final Clinical Impression(s) / ED Diagnoses Final diagnoses:  Acute kidney injury superimposed on chronic kidney disease ([place])  Hypokalemia    Rx / DC Orders ED Discharge Orders    None       Sherwood Gambler, MD 07/06/20 1008    Sherwood Gambler, MD 07/06/20 1022

## 2020-07-06 NOTE — Discharge Instructions (Addendum)
Your creatinine today is 3.16.  This indicates acute kidney injury on top of your chronic kidney disease.  It was recommended that you be admitted for further fluids and work-up/treatment to help prevent long-term or permanent kidney damage.  If at any point you feel worse or change your mind you should return to the hospital for evaluation.  Call your nephrologist/kidney specialist today.  Follow up with your primary care physician for your port redness

## 2020-07-11 ENCOUNTER — Emergency Department (HOSPITAL_BASED_OUTPATIENT_CLINIC_OR_DEPARTMENT_OTHER): Payer: PRIVATE HEALTH INSURANCE

## 2020-07-11 ENCOUNTER — Other Ambulatory Visit: Payer: Self-pay

## 2020-07-11 ENCOUNTER — Encounter (HOSPITAL_BASED_OUTPATIENT_CLINIC_OR_DEPARTMENT_OTHER): Payer: Self-pay | Admitting: Emergency Medicine

## 2020-07-11 ENCOUNTER — Emergency Department (HOSPITAL_BASED_OUTPATIENT_CLINIC_OR_DEPARTMENT_OTHER)
Admission: EM | Admit: 2020-07-11 | Discharge: 2020-07-11 | Disposition: A | Discharge status: Home / Self Care | Payer: PRIVATE HEALTH INSURANCE | Attending: Emergency Medicine | Admitting: Emergency Medicine

## 2020-07-11 DIAGNOSIS — R748 Abnormal levels of other serum enzymes: Secondary | ICD-10-CM | POA: Insufficient documentation

## 2020-07-11 DIAGNOSIS — Z20822 Contact with and (suspected) exposure to covid-19: Secondary | ICD-10-CM | POA: Insufficient documentation

## 2020-07-11 DIAGNOSIS — J189 Pneumonia, unspecified organism: Secondary | ICD-10-CM

## 2020-07-11 DIAGNOSIS — R1084 Generalized abdominal pain: Secondary | ICD-10-CM | POA: Insufficient documentation

## 2020-07-11 DIAGNOSIS — R112 Nausea with vomiting, unspecified: Secondary | ICD-10-CM | POA: Insufficient documentation

## 2020-07-11 DIAGNOSIS — J181 Lobar pneumonia, unspecified organism: Secondary | ICD-10-CM | POA: Diagnosis not present

## 2020-07-11 DIAGNOSIS — R079 Chest pain, unspecified: Secondary | ICD-10-CM | POA: Diagnosis present

## 2020-07-11 DIAGNOSIS — Z79899 Other long term (current) drug therapy: Secondary | ICD-10-CM | POA: Insufficient documentation

## 2020-07-11 LAB — COMPREHENSIVE METABOLIC PANEL
ALT: 17 U/L (ref 0–44)
AST: 20 U/L (ref 15–41)
Albumin: 3.1 g/dL — ABNORMAL LOW (ref 3.5–5.0)
Alkaline Phosphatase: 98 U/L (ref 38–126)
Anion gap: 15 (ref 5–15)
BUN: 26 mg/dL — ABNORMAL HIGH (ref 6–20)
CO2: 35 mmol/L — ABNORMAL HIGH (ref 22–32)
Calcium: 8.8 mg/dL — ABNORMAL LOW (ref 8.9–10.3)
Chloride: 80 mmol/L — ABNORMAL LOW (ref 98–111)
Creatinine, Ser: 2.92 mg/dL — ABNORMAL HIGH (ref 0.44–1.00)
GFR calc Af Amer: 24 mL/min — ABNORMAL LOW (ref >60)
GFR calc non Af Amer: 21 mL/min — ABNORMAL LOW (ref >60)
Glucose, Bld: 113 mg/dL — ABNORMAL HIGH (ref 70–99)
Potassium: 2.9 mmol/L — ABNORMAL LOW (ref 3.5–5.1)
Sodium: 130 mmol/L — ABNORMAL LOW (ref 135–145)
Total Bilirubin: 0.5 mg/dL (ref 0.3–1.2)
Total Protein: 7.6 g/dL (ref 6.5–8.1)

## 2020-07-11 LAB — URINALYSIS, ROUTINE W REFLEX MICROSCOPIC
Bilirubin Urine: NEGATIVE
Glucose, UA: NEGATIVE mg/dL
Hgb urine dipstick: NEGATIVE
Ketones, ur: NEGATIVE mg/dL
Nitrite: NEGATIVE
Protein, ur: NEGATIVE mg/dL
Specific Gravity, Urine: 1.01 (ref 1.005–1.030)
pH: 8.5 — ABNORMAL HIGH (ref 5.0–8.0)

## 2020-07-11 LAB — CBC WITH DIFFERENTIAL/PLATELET
Abs Immature Granulocytes: 0.04 10*3/uL (ref 0.00–0.07)
Basophils Absolute: 0 10*3/uL (ref 0.0–0.1)
Basophils Relative: 0 %
Eosinophils Absolute: 0 10*3/uL (ref 0.0–0.5)
Eosinophils Relative: 0 %
HCT: 27.9 % — ABNORMAL LOW (ref 36.0–46.0)
Hemoglobin: 9.1 g/dL — ABNORMAL LOW (ref 12.0–15.0)
Immature Granulocytes: 0 %
Lymphocytes Relative: 13 %
Lymphs Abs: 1.3 10*3/uL (ref 0.7–4.0)
MCH: 28.3 pg (ref 26.0–34.0)
MCHC: 32.6 g/dL (ref 30.0–36.0)
MCV: 86.9 fL (ref 80.0–100.0)
Monocytes Absolute: 1.3 10*3/uL — ABNORMAL HIGH (ref 0.1–1.0)
Monocytes Relative: 13 %
Neutro Abs: 7 10*3/uL (ref 1.7–7.7)
Neutrophils Relative %: 74 %
Platelets: 304 10*3/uL (ref 150–400)
RBC: 3.21 MIL/uL — ABNORMAL LOW (ref 3.87–5.11)
RDW: 12.4 % (ref 11.5–15.5)
WBC: 9.6 10*3/uL (ref 4.0–10.5)
nRBC: 0 % (ref 0.0–0.2)

## 2020-07-11 LAB — URINALYSIS, MICROSCOPIC (REFLEX): RBC / HPF: NONE SEEN RBC/hpf (ref 0–5)

## 2020-07-11 LAB — PREGNANCY, URINE: Preg Test, Ur: NEGATIVE

## 2020-07-11 LAB — LIPASE, BLOOD: Lipase: 27 U/L (ref 11–51)

## 2020-07-11 LAB — SARS CORONAVIRUS 2 BY RT PCR (HOSPITAL ORDER, PERFORMED IN ~~LOC~~ HOSPITAL LAB): SARS Coronavirus 2: NEGATIVE

## 2020-07-11 MED ORDER — SODIUM CHLORIDE 0.9 % IV SOLN
1.0000 g | Freq: Once | INTRAVENOUS | Status: AC
  Administered 2020-07-11: 1 g via INTRAVENOUS
  Filled 2020-07-11: qty 10

## 2020-07-11 MED ORDER — ACETAMINOPHEN 160 MG/5ML PO SOLN
650.0000 mg | Freq: Once | ORAL | Status: AC
  Administered 2020-07-11: 650 mg via ORAL
  Filled 2020-07-11: qty 20.3

## 2020-07-11 MED ORDER — SODIUM CHLORIDE 0.9 % IV SOLN
500.0000 mg | Freq: Once | INTRAVENOUS | Status: AC
  Administered 2020-07-11: 500 mg via INTRAVENOUS
  Filled 2020-07-11: qty 500

## 2020-07-11 MED ORDER — HEPARIN SOD (PORK) LOCK FLUSH 100 UNIT/ML IV SOLN
500.0000 [IU] | Freq: Once | INTRAVENOUS | Status: AC
  Administered 2020-07-11: 500 [IU]
  Filled 2020-07-11: qty 5

## 2020-07-11 MED ORDER — AZITHROMYCIN 250 MG PO TABS
250.0000 mg | ORAL_TABLET | Freq: Every day | ORAL | 0 refills | Status: AC
Start: 2020-07-11 — End: 2020-07-15

## 2020-07-11 MED ORDER — ONDANSETRON HCL 4 MG/2ML IJ SOLN
4.0000 mg | Freq: Once | INTRAMUSCULAR | Status: AC
  Administered 2020-07-11: 4 mg via INTRAVENOUS

## 2020-07-11 MED ORDER — CEFDINIR 300 MG PO CAPS
300.0000 mg | ORAL_CAPSULE | Freq: Two times a day (BID) | ORAL | 0 refills | Status: DC
Start: 2020-07-11 — End: 2020-07-21

## 2020-07-11 MED ORDER — ONDANSETRON HCL 4 MG/2ML IJ SOLN
4.0000 mg | Freq: Once | INTRAMUSCULAR | Status: AC
  Administered 2020-07-11: 4 mg via INTRAVENOUS
  Filled 2020-07-11: qty 2

## 2020-07-11 MED ORDER — ONDANSETRON HCL 4 MG/2ML IJ SOLN
4.0000 mg | Freq: Once | INTRAMUSCULAR | Status: DC
  Filled 2020-07-11: qty 2

## 2020-07-11 MED ORDER — HYDROMORPHONE HCL 1 MG/ML IJ SOLN
0.5000 mg | Freq: Once | INTRAMUSCULAR | Status: AC
  Administered 2020-07-11: 0.5 mg via INTRAVENOUS
  Filled 2020-07-11: qty 1

## 2020-07-11 MED ORDER — LACTATED RINGERS IV BOLUS
1000.0000 mL | Freq: Once | INTRAVENOUS | Status: AC
  Administered 2020-07-11: 1000 mL via INTRAVENOUS

## 2020-07-11 NOTE — ED Provider Notes (Signed)
[facility] EMERGENCY DEPARTMENT Provider Note   CSN: [id number] Arrival date & time: 07/11/20  0758     History Chief Complaint  Patient presents with  . Abdominal Pain  . Chest Pain    Cindy [name] is a 29 y.o. female.   Abdominal Pain Pain location:  Generalized Pain quality: aching   Pain radiates to:  Does not radiate Pain severity:  Severe Onset quality:  Gradual Timing:  Constant Progression:  Worsening Chronicity:  New Context comment:  Unknown cause Relieved by:  Nothing Worsened by:  Nothing Ineffective treatments:  OTC medications (hydration) Associated symptoms: chest pain, nausea and vomiting   Associated symptoms: no chills, no cough, no diarrhea, no dysuria, no fever and no shortness of breath   Risk factors: multiple surgeries   Chest Pain Associated symptoms: abdominal pain, nausea and vomiting   Associated symptoms: no back pain, no cough, no fever, no headache, no palpitations and no shortness of breath        Past Medical History:  Diagnosis Date  . Bilateral flank pain    due to ureteral stents  . Chronic hypokalemia   . CKD (chronic kidney disease), stage II   . 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 Complication of anesthesia    post op aspiration pneumonia w/ lap. appy 12-23-2007 (emergent ruptured appendix)  . FAP (familial adenomatous polyposis) 09/30/2014  . Heart murmur   . History of acute renal failure    2015;  2016  . History of fetal demise, not currently pregnant    03-22-2013  stillborn at 63 wks  . History of kidney stones   . History of sepsis    admitted 12-03-2016 (post-op day 2 w/ bilateral ureteral stents and stone manipluation) discharged 12-06-2016  for urosepsis  . Hypomagnesemia   . Ileostomy in place [facility])     . Iron deficiency anemia   . Nephrolithiasis    BILATERAL   . Normocytic anemia   . Renal cyst, left    PER CT 11-12-2016  . Urgency of urination     Patient Active Problem List   Diagnosis Date Noted  . Lower abdominal pain 07/13/2018  . Elevated lipase 07/13/2018  . AKI (acute kidney injury) ([place]) 09/02/2017  . Dehydration 06/07/2017  . Sepsis, unspecified organism ([place]) 12/03/2016  . 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 Normocytic anemia 05/02/2015  . Hypotension   . High output ileostomy ([place]) 09/30/2014  . FAP (familial adenomatous polyposis) 09/30/2014  . Nephrolithiasis 09/30/2014  . Adenocarcinoma of colon ([place]) 05/26/2013  . Nausea with vomiting 05/21/2013  . Microcytic anemia 04/25/2013  . Hypokalemia 04/25/2013    Past Surgical History:  Procedure Laterality Date  . COLONOSCOPY N/A 05/24/2013   Procedure: COLONOSCOPY;  Surgeon: Missy Sabins, MD;  Location: [facility];  Service: Endoscopy;  Laterality: N/A;  . COMPLETION PROTECTOMY AND REVISION ILEOSTOMY/ LYSIS ADHESIONS  07-28-2014    [facility]  . CYSTOSCOPY W/ RETROGRADES Right 12/15/2016   Procedure: CYSTOSCOPY WITH RETROGRADE PYELOGRAM;  Surgeon: Alexis Frock, MD;  Location: [facility];  Service: Urology;  Laterality: Right;  . CYSTOSCOPY W/ URETERAL STENT PLACEMENT Bilateral 11/01/2016   Procedure: CYSTOSCOPY WITH BILATERAL RETROGRADE PYELOGRAM BILATERAL URETERAL STENT PLACEMENT;  Surgeon: Alexis Frock, MD;  Location: [facility];  Service: Urology;  Laterality: Bilateral;  . CYSTOSCOPY W/ URETERAL STENT PLACEMENT Right 12/15/2016   Procedure: CYSTOSCOPY WITH STENT REPLACEMENT;  Surgeon: Alexis Frock, MD;  Location: [facility];  Service: Urology;  Laterality: Right;  . CYSTOSCOPY W/ URETERAL STENT REMOVAL Bilateral 12/15/2016   Procedure: CYSTOSCOPY WITH STENT REMOVAL;  Surgeon: Alexis Frock, MD;  Location: [facility];  Service: Urology;  Laterality:  Bilateral;  . CYSTOSCOPY WITH URETEROSCOPY Bilateral 12/01/2016   Procedure: FIRST STAGE CYSTOSCOPY WITH URETEROSCOPY,  STONE MANIPULATION and BASKETRY, STENT EXCHANGE;  Surgeon: Alexis Frock, MD;  Location: [facility];  Service: Urology;  Laterality: Bilateral;  . CYSTOSCOPY WITH URETEROSCOPY Right 12/15/2016   Procedure: SECOND STAGE -CYSTOSCOPY WITH URETEROSCOPY AND STONE EXTRACTION WITH BASKET;  Surgeon: Alexis Frock, MD;  Location: [facility];  Service: Urology;  Laterality: Right;  . HOLMIUM LASER APPLICATION Bilateral 03/03/8294   Procedure: HOLMIUM LASER APPLICATION;  Surgeon: Alexis Frock, MD;  Location: [facility];  Service: Urology;  Laterality: Bilateral;  . HOLMIUM LASER APPLICATION Right 05/17/3085   Procedure: HOLMIUM LASER APPLICATION;  Surgeon: Alexis Frock, MD;  Location: [facility];  Service: Urology;  Laterality: Right;  . I & D EXTREMITY Left 11/14/2013   Procedure: IRRIGATION AND DEBRIDEMENT LEFT LONG FINGER;  Surgeon: Tennis Must, MD;  Location: [place];  Service: Orthopedics;  Laterality: Left;  . IM NAILING TIBIA Left 08/23/2010  . LAPAROSCOPIC APPENDECTOMY  12/23/2007  . LEG RECONSTRUCTION USING FASCIAL FLAP  ~ 2009  . SUBTOTAL COLECTOMY /  RESECTION ENBLOC DISTAL ILEUM/  ILEOSTOMY  07/ 2014  [facility]  . TRANSTHORACIC ECHOCARDIOGRAM  11/11/2011   ef 55-60%/  mild MR/  trivial PR and TR/  PASP 29-76mmHg/  trivial pericardial effusion identified     OB History    Gravida  1   Para  1   Term  0   Preterm  1   AB  0   Living        SAB  0   TAB  0   Ectopic  0   Multiple      Live Births              Family History  Adopted: Yes  Problem Relation Age of Onset  . ALS Mother   . Healthy Father   . Alcohol abuse Neg Hx   . Arthritis Neg Hx   . Asthma Neg Hx   . Birth defects Neg Hx   . Cancer Neg Hx   . COPD Neg Hx   . Depression Neg Hx   . Diabetes Neg Hx     . Drug abuse Neg Hx   . Early death Neg Hx   . Hearing loss Neg Hx   . Heart disease Neg Hx   . Hyperlipidemia Neg Hx   . Hypertension Neg Hx   . Kidney disease Neg Hx   . Learning disabilities Neg Hx   . Mental illness Neg Hx   . Mental retardation Neg Hx   . Miscarriages / Stillbirths Neg Hx   . Stroke Neg Hx   . Vision loss Neg Hx     Social History   Tobacco Use  . Smoking status: Never Smoker  . Smokeless tobacco: Never Used  Vaping Use  . Vaping Use: Never used  Substance Use Topics  . Alcohol use: No  . Drug use: No    Home Medications  Prior to Admission medications   Medication Sig Start Date End Date Taking? Authorizing Provider  acetaminophen (TYLENOL) 325 MG tablet Take 650 mg by mouth every 6 (six) hours as needed for mild pain or headache.    [provider]  azithromycin (ZITHROMAX) 250 MG tablet Take 1 tablet (250 mg total) by mouth daily for 4 days. Take first 2 tablets together, then 1 every day until finished. 07/11/20 07/15/20  Breck Coons, MD  cefdinir (OMNICEF) 300 MG capsule Take 1 capsule (300 mg total) by mouth 2 (two) times daily for 9 days. 07/11/20 07/20/20  Breck Coons, MD  cephALEXin (KEFLEX) 250 MG capsule Take 1 capsule (250 mg total) by mouth 3 (three) times daily for 7 days. 07/06/20 07/13/20  Sherwood Gambler, MD  cetirizine (ZYRTEC) 10 MG tablet Take 1 tablet (10 mg total) by mouth daily. Patient not taking: Reported on 12/16/2019 07/09/19   Loura Halt A, NP  cyanocobalamin (,VITAMIN B-12,) 1000 MCG/ML injection Inject 1,000 mcg into the muscle once a week.     [provider]  medroxyPROGESTERone (DEPO-PROVERA) 150 MG/ML injection Inject 150 mg into the muscle every 3 (three) months.    [provider]  Multiple Vitamins-Minerals (MULTIVITAMIN GUMMIES ADULT) CHEW Chew 2 each by mouth daily.     [provider]  potassium chloride SA (KLOR-CON) 20 MEQ tablet Take 20 mEq by mouth 2 (two) times daily.    [provider]  Vitamin D, Ergocalciferol, (DRISDOL) 50000 units CAPS capsule Take 50,000 Units by mouth every Monday.  05/22/16   [provider]    Allergies    Food, Iron, Venofer [ferric oxide], Soap, Morphine and related, Sulfa antibiotics, Sulfamethoxazole-trimethoprim, and Levaquin [levofloxacin]  Review of Systems   Review of Systems  Constitutional: Negative for chills and fever.  HENT: Negative for congestion and rhinorrhea.   Respiratory: Negative for cough and shortness of breath.   Cardiovascular: Positive for chest pain. Negative for palpitations.  Gastrointestinal: Positive for abdominal pain, nausea and vomiting. Negative for diarrhea.  Genitourinary: Negative for difficulty urinating and dysuria.  Musculoskeletal: Negative for arthralgias and back pain.  Skin: Negative for rash and wound.  Neurological: Negative for light-headedness and headaches.    Physical Exam Updated Vital Signs BP 96/60 (BP Location: Left Arm)   Pulse (!) 112   Temp 99.6 F (37.6 C) (Oral)   Resp 15   SpO2 95%   Physical Exam Vitals and nursing note reviewed. Exam conducted with a chaperone present.  Constitutional:      General: She is not in acute distress.    Appearance: Normal appearance.  HENT:     Head: Normocephalic and atraumatic.     Nose: No rhinorrhea.  Eyes:     General:        Right eye: No discharge.        Left eye: No discharge.     Conjunctiva/sclera: Conjunctivae normal.  Cardiovascular:     Rate and Rhythm: Normal rate and regular rhythm.  Pulmonary:     Effort: Pulmonary effort is normal. No respiratory distress.     Breath sounds: No stridor.  Abdominal:     General: Abdomen is flat. There is no distension.     Palpations: Abdomen is soft.     Tenderness: There is generalized abdominal tenderness. There is guarding (voluntary). There is no right CVA tenderness or left CVA tenderness. Negative signs include Murphy's sign and McBurney's sign.      Comments:  Ileostomy site with full watery output, no blood appearing, no signs of surrounding erythema, healthy-appearing stomal tissue  Musculoskeletal:        General: No tenderness or signs of injury.  Skin:    General: Skin is warm and dry.  Neurological:     General: No focal deficit present.     Mental Status: She is alert. Mental status is at baseline.     Motor: No weakness.  Psychiatric:        Mood and Affect: Mood normal.        Behavior: Behavior normal.     ED Results / Procedures / Treatments   Labs (all labs ordered are listed, but only abnormal results are displayed) Labs Reviewed  CBC WITH DIFFERENTIAL/PLATELET - Abnormal; Notable for the following components:      Result Value   RBC 3.21 (*)    Hemoglobin 9.1 (*)    HCT 27.9 (*)    Monocytes Absolute 1.3 (*)    All other components within normal limits  COMPREHENSIVE METABOLIC PANEL - Abnormal; Notable for the following components:   Sodium 130 (*)    Potassium 2.9 (*)    Chloride 80 (*)    CO2 35 (*)    Glucose, Bld 113 (*)    BUN 26 (*)    Creatinine, Ser 2.92 (*)    Calcium 8.8 (*)    Albumin 3.1 (*)    GFR calc non Af Amer 21 (*)    GFR calc Af Amer 24 (*)    All other components within normal limits  URINALYSIS, ROUTINE W REFLEX MICROSCOPIC - Abnormal; Notable for the following components:   APPearance HAZY (*)    pH 8.5 (*)    Leukocytes,Ua SMALL (*)    All other components within normal limits  URINALYSIS, MICROSCOPIC (REFLEX) - Abnormal; Notable for the following components:   Bacteria, UA MANY (*)    All other components within normal limits  SARS CORONAVIRUS 2 BY RT PCR [facility] ORDER, [place] [facility])  LIPASE, BLOOD  PREGNANCY, URINE    EKG EKG Interpretation  Date/Time:  Sunday July 11 2020 08:07:45 EDT Ventricular Rate:  92 PR Interval:  136 QRS Duration: 74 QT Interval:  370 QTC Calculation: 457 R Axis:   86 Text Interpretation: Normal sinus rhythm  Biatrial enlargement Abnormal ECG Confirmed by Dewaine Conger [phone]) on 07/11/2020 8:41:02 AM   Radiology CT ABDOMEN PELVIS WO CONTRAST  Result Date: 07/11/2020 CLINICAL DATA:  Nausea and vomiting. Abdominal pain. History of ileostomy. History of colon cancer. EXAM: CT ABDOMEN AND PELVIS WITHOUT CONTRAST TECHNIQUE: Multidetector CT imaging of the abdomen and pelvis was performed following the standard protocol without IV contrast. COMPARISON:  CT abdomen pelvis 12/16/2019 FINDINGS: Lower chest: Normal heart size. Interval development of a 1.5 cm subpleural nodular area of consolidation right lower lung (image 7; series 4). There is an additional adjacent 2.0 cm nodular area of consolidation right lower lung (image 7; series 4). Partially visualized ground-glass opacities right upper lobe. Small focal area of consolidation right middle lobe. Dependent atelectasis within the bilateral lower lobes. No pleural effusion. Hepatobiliary: Liver is normal in size and contour. Gallbladder is unremarkable. Pancreas: Unremarkable Spleen: Unremarkable Adrenals/Urinary Tract: Normal adrenal glands. Punctate bilateral nephrolithiasis. No hydronephrosis. Urinary bladder is unremarkable. Stomach/Bowel: Postsurgical changes compatible with colectomy and right lower quadrant ileostomy. No acute process. Vascular/Lymphatic: Normal caliber abdominal aorta. No retroperitoneal lymphadenopathy. Reproductive: Unremarkable Other: Similar-appearing 3.4 x 1.6 cm low-attenuation region  within the presacral space most compatible with postoperative changes. New small amount of free fluid in the lower abdomen. Musculoskeletal: No aggressive or acute appearing osseous lesions. IMPRESSION: 1. Interval development of nodular areas of consolidation within the right lower lung which are nonspecific and may be infectious/inflammatory in etiology. Recommend dedicated chest CT for evaluation as there is a large area of ground-glass opacity within the  right upper lobe which is incompletely visualized. Additional considerations would be pulmonary infarct or metastasis. 2. Postsurgical changes compatible with colectomy and right lower quadrant ileostomy. New nonspecific small amount of free fluid within the lower abdomen. 3. Nonobstructing right renal stone. 4. Similar-appearing low-attenuation region within the presacral space most compatible with postoperative changes. Electronically Signed   By: Lovey Newcomer M.D.   On: 07/11/2020 10:44   DG Chest 2 View  Result Date: 07/11/2020 CLINICAL DATA:  Chest pain EXAM: CHEST - 2 VIEW COMPARISON:  12/16/2019 FINDINGS: Cardiac shadows within normal limits. The lungs are well aerated bilaterally. Mild patchy infiltrate is noted the lateral right lung base. No sizable seen. Right chest wall port is noted. No bony abnormality seen. IMPRESSION: Mild right basilar infiltrate. Electronically Signed   By: Inez Catalina M.D.   On: 07/11/2020 10:47    Procedures Procedures (including critical care time)  Medications Ordered in ED Medications  ondansetron (ZOFRAN) injection 4 mg (4 mg Intravenous Not Given 07/11/20 1254)  HYDROmorphone (DILAUDID) injection 0.5 mg (0.5 mg Intravenous Given 07/11/20 0858)  ondansetron (ZOFRAN) injection 4 mg (4 mg Intravenous Given 07/11/20 0858)  lactated ringers bolus 1,000 mL (0 mLs Intravenous Stopped 07/11/20 1004)  cefTRIAXone (ROCEPHIN) 1 g in sodium chloride 0.9 % 100 mL IVPB (0 g Intravenous Stopped 07/11/20 1328)  azithromycin (ZITHROMAX) 500 mg in sodium chloride 0.9 % 250 mL IVPB (0 mg Intravenous Stopped 07/11/20 1302)  ondansetron (ZOFRAN) injection 4 mg (4 mg Intravenous Given 07/11/20 1258)  heparin lock flush 100 unit/mL (500 Units Intracatheter Given 07/11/20 1341)  acetaminophen (TYLENOL) 160 MG/5ML solution 650 mg (650 mg Oral Given 07/11/20 1339)    ED Course  I have reviewed the triage vital signs and the nursing notes.  Pertinent labs & imaging results that were  available during my care of the patient were reviewed by me and considered in my medical decision making (see chart for details).    MDM Rules/Calculators/A&P                          29 year old female with worsening abdominal pain nausea vomiting. History of multiple bowel surgeries. Exquisitely tender on exam will get CT scan labs, will get pain control IV fluids antiemetics. Also complains of pain rating up into her chest will get chest x-ray EKG. Blood pressure is low here however she says it is always in the low 90s. Her heart rate is normal. She says she has a morphine allergy but does well with medications that are given through her port. She says she tolerates Dilaudid well. EKG reviewed by myself shows sinus tachycardia with no acute ischemic change interval abnormality or arrhythmia.  Patient CT scan is done without contrast due to waxing and waning kidney dysfunction and shows her creatinine is elevated today.  Was even higher several days ago.  Looking back to medical record her kidney dysfunction waxes and wanes pretty significantly, she has been offered admission for evaluation on multiple occasions but always desires a discharge home.  Advised her that this could  result in permanent kidney dysfunction which could eventually lead to need for dialysis or death she understands this and still prefers outpatient management.  She has waxing and waning fevers and chills subjectively here but has remained hemodynamically stable.  Review of her medical record and commentary by her shows her blood pressure is stable.  Urinalysis shows that there could be infection, she will be given ceftriaxone, chest x-ray and CT scan show concerning signs for possible right sided pneumonia.  The ceftriaxone will cover this azithromycin is added.  She is offered admission for further management she declines.  She is going to be discharged home with outpatient antibiotics.  She will return as needed.  I advised her to  follow-up with her general surgeon who did her original surgery says she is having frequent bouts of abdominal pain and we have been unable to find a reason for this.  They may be able to help manage her pain better so they want to perform surgical intervention on her Final Clinical Impression(s) / ED Diagnoses Final diagnoses:  Elevated creatine kinase  Community acquired pneumonia of right lung, unspecified part of lung    Rx / DC Orders ED Discharge Orders         Ordered    cefdinir (OMNICEF) 300 MG capsule  2 times daily     Discontinue  Reprint     07/11/20 1218    azithromycin (ZITHROMAX) 250 MG tablet  Daily     Discontinue  Reprint     07/11/20 1218           Breck Coons, MD 07/11/20 1353

## 2020-07-11 NOTE — Discharge Instructions (Addendum)
You can open the capsule or crush the tablet for theses medications

## 2020-07-11 NOTE — ED Triage Notes (Signed)
Central chest pain and mid abd pain since this morning. Endorses nausea.

## 2020-07-18 ENCOUNTER — Encounter (HOSPITAL_COMMUNITY): Payer: Self-pay | Admitting: Internal Medicine

## 2020-07-18 ENCOUNTER — Ambulatory Visit
Admission: EM | Admit: 2020-07-18 | Discharge: 2020-07-18 | Disposition: A | Discharge status: Home / Self Care | Payer: PRIVATE HEALTH INSURANCE | Source: Home / Self Care

## 2020-07-18 ENCOUNTER — Inpatient Hospital Stay (HOSPITAL_COMMUNITY): Payer: PRIVATE HEALTH INSURANCE

## 2020-07-18 ENCOUNTER — Inpatient Hospital Stay (HOSPITAL_COMMUNITY)
Admission: EM | Admit: 2020-07-18 | Discharge: 2020-07-21 | DRG: 871 | Disposition: A | Discharge status: Home / Self Care | Payer: PRIVATE HEALTH INSURANCE | Attending: Internal Medicine | Admitting: Internal Medicine

## 2020-07-18 ENCOUNTER — Other Ambulatory Visit: Payer: Self-pay

## 2020-07-18 ENCOUNTER — Emergency Department (HOSPITAL_COMMUNITY): Payer: PRIVATE HEALTH INSURANCE

## 2020-07-18 DIAGNOSIS — Z91018 Allergy to other foods: Secondary | ICD-10-CM

## 2020-07-18 DIAGNOSIS — R9431 Abnormal electrocardiogram [ECG] [EKG]: Secondary | ICD-10-CM | POA: Diagnosis present

## 2020-07-18 DIAGNOSIS — I959 Hypotension, unspecified: Secondary | ICD-10-CM | POA: Insufficient documentation

## 2020-07-18 DIAGNOSIS — E871 Hypo-osmolality and hyponatremia: Secondary | ICD-10-CM | POA: Diagnosis present

## 2020-07-18 DIAGNOSIS — B957 Other staphylococcus as the cause of diseases classified elsewhere: Secondary | ICD-10-CM | POA: Diagnosis present

## 2020-07-18 DIAGNOSIS — R05 Cough: Secondary | ICD-10-CM | POA: Insufficient documentation

## 2020-07-18 DIAGNOSIS — J189 Pneumonia, unspecified organism: Secondary | ICD-10-CM | POA: Diagnosis present

## 2020-07-18 DIAGNOSIS — Z20822 Contact with and (suspected) exposure to covid-19: Secondary | ICD-10-CM | POA: Diagnosis present

## 2020-07-18 DIAGNOSIS — Z87442 Personal history of urinary calculi: Secondary | ICD-10-CM | POA: Diagnosis not present

## 2020-07-18 DIAGNOSIS — N179 Acute kidney failure, unspecified: Secondary | ICD-10-CM | POA: Diagnosis present

## 2020-07-18 DIAGNOSIS — G8929 Other chronic pain: Secondary | ICD-10-CM | POA: Diagnosis present

## 2020-07-18 DIAGNOSIS — Z888 Allergy status to other drugs, medicaments and biological substances status: Secondary | ICD-10-CM | POA: Diagnosis not present

## 2020-07-18 DIAGNOSIS — Z79899 Other long term (current) drug therapy: Secondary | ICD-10-CM | POA: Insufficient documentation

## 2020-07-18 DIAGNOSIS — E876 Hypokalemia: Secondary | ICD-10-CM | POA: Diagnosis present

## 2020-07-18 DIAGNOSIS — E860 Dehydration: Secondary | ICD-10-CM | POA: Diagnosis present

## 2020-07-18 DIAGNOSIS — A4152 Sepsis due to Pseudomonas: Secondary | ICD-10-CM | POA: Diagnosis present

## 2020-07-18 DIAGNOSIS — N1832 Chronic kidney disease, stage 3b: Secondary | ICD-10-CM | POA: Diagnosis present

## 2020-07-18 DIAGNOSIS — Z932 Ileostomy status: Secondary | ICD-10-CM | POA: Diagnosis not present

## 2020-07-18 DIAGNOSIS — R531 Weakness: Secondary | ICD-10-CM | POA: Insufficient documentation

## 2020-07-18 DIAGNOSIS — A419 Sepsis, unspecified organism: Secondary | ICD-10-CM | POA: Diagnosis present

## 2020-07-18 DIAGNOSIS — Z85038 Personal history of other malignant neoplasm of large intestine: Secondary | ICD-10-CM | POA: Diagnosis not present

## 2020-07-18 DIAGNOSIS — D126 Benign neoplasm of colon, unspecified: Secondary | ICD-10-CM | POA: Diagnosis present

## 2020-07-18 DIAGNOSIS — R059 Cough, unspecified: Secondary | ICD-10-CM

## 2020-07-18 DIAGNOSIS — D509 Iron deficiency anemia, unspecified: Secondary | ICD-10-CM | POA: Diagnosis present

## 2020-07-18 DIAGNOSIS — R1030 Lower abdominal pain, unspecified: Secondary | ICD-10-CM | POA: Diagnosis present

## 2020-07-18 DIAGNOSIS — N184 Chronic kidney disease, stage 4 (severe): Secondary | ICD-10-CM | POA: Diagnosis present

## 2020-07-18 DIAGNOSIS — E861 Hypovolemia: Secondary | ICD-10-CM | POA: Diagnosis present

## 2020-07-18 DIAGNOSIS — R509 Fever, unspecified: Secondary | ICD-10-CM

## 2020-07-18 DIAGNOSIS — R6520 Severe sepsis without septic shock: Secondary | ICD-10-CM | POA: Diagnosis present

## 2020-07-18 DIAGNOSIS — Z882 Allergy status to sulfonamides status: Secondary | ICD-10-CM

## 2020-07-18 DIAGNOSIS — N182 Chronic kidney disease, stage 2 (mild): Secondary | ICD-10-CM | POA: Insufficient documentation

## 2020-07-18 DIAGNOSIS — D1391 Familial adenomatous polyposis: Secondary | ICD-10-CM | POA: Diagnosis present

## 2020-07-18 LAB — CREATININE, URINE, RANDOM: Creatinine, Urine: 107.16 mg/dL

## 2020-07-18 LAB — BLOOD GAS, VENOUS
Acid-Base Excess: 5.7 mmol/L — ABNORMAL HIGH (ref 0.0–2.0)
Bicarbonate: 29.7 mmol/L — ABNORMAL HIGH (ref 20.0–28.0)
O2 Saturation: 98.7 %
Patient temperature: 98.6
pCO2, Ven: 42.3 mmHg — ABNORMAL LOW (ref 44.0–60.0)
pH, Ven: 7.459 — ABNORMAL HIGH (ref 7.250–7.430)
pO2, Ven: 103 mmHg — ABNORMAL HIGH (ref 32.0–45.0)

## 2020-07-18 LAB — CBC WITH DIFFERENTIAL/PLATELET
Abs Immature Granulocytes: 0.12 10*3/uL — ABNORMAL HIGH (ref 0.00–0.07)
Basophils Absolute: 0 10*3/uL (ref 0.0–0.1)
Basophils Relative: 0 %
Eosinophils Absolute: 0 10*3/uL (ref 0.0–0.5)
Eosinophils Relative: 0 %
HCT: 26.6 % — ABNORMAL LOW (ref 36.0–46.0)
Hemoglobin: 8.7 g/dL — ABNORMAL LOW (ref 12.0–15.0)
Immature Granulocytes: 1 %
Lymphocytes Relative: 8 %
Lymphs Abs: 1.3 10*3/uL (ref 0.7–4.0)
MCH: 28.2 pg (ref 26.0–34.0)
MCHC: 32.7 g/dL (ref 30.0–36.0)
MCV: 86.4 fL (ref 80.0–100.0)
Monocytes Absolute: 0.8 10*3/uL (ref 0.1–1.0)
Monocytes Relative: 5 %
Neutro Abs: 14.2 10*3/uL — ABNORMAL HIGH (ref 1.7–7.7)
Neutrophils Relative %: 86 %
Platelets: 369 10*3/uL (ref 150–400)
RBC: 3.08 MIL/uL — ABNORMAL LOW (ref 3.87–5.11)
RDW: 12.9 % (ref 11.5–15.5)
WBC: 16.4 10*3/uL — ABNORMAL HIGH (ref 4.0–10.5)
nRBC: 0 % (ref 0.0–0.2)

## 2020-07-18 LAB — MAGNESIUM: Magnesium: 1.6 mg/dL — ABNORMAL LOW (ref 1.7–2.4)

## 2020-07-18 LAB — URINALYSIS, ROUTINE W REFLEX MICROSCOPIC
Bilirubin Urine: NEGATIVE
Glucose, UA: NEGATIVE mg/dL
Hgb urine dipstick: NEGATIVE
Ketones, ur: NEGATIVE mg/dL
Nitrite: NEGATIVE
Protein, ur: NEGATIVE mg/dL
Specific Gravity, Urine: 1.01 (ref 1.005–1.030)
pH: 9 — ABNORMAL HIGH (ref 5.0–8.0)

## 2020-07-18 LAB — COMPREHENSIVE METABOLIC PANEL
ALT: 29 U/L (ref 0–44)
AST: 27 U/L (ref 15–41)
Albumin: 3.3 g/dL — ABNORMAL LOW (ref 3.5–5.0)
Alkaline Phosphatase: 97 U/L (ref 38–126)
Anion gap: 14 (ref 5–15)
BUN: 23 mg/dL — ABNORMAL HIGH (ref 6–20)
CO2: 36 mmol/L — ABNORMAL HIGH (ref 22–32)
Calcium: 8.6 mg/dL — ABNORMAL LOW (ref 8.9–10.3)
Chloride: 79 mmol/L — ABNORMAL LOW (ref 98–111)
Creatinine, Ser: 3.09 mg/dL — ABNORMAL HIGH (ref 0.44–1.00)
GFR calc Af Amer: 22 mL/min — ABNORMAL LOW (ref >60)
GFR calc non Af Amer: 19 mL/min — ABNORMAL LOW (ref >60)
Glucose, Bld: 121 mg/dL — ABNORMAL HIGH (ref 70–99)
Potassium: 3.3 mmol/L — ABNORMAL LOW (ref 3.5–5.1)
Sodium: 129 mmol/L — ABNORMAL LOW (ref 135–145)
Total Bilirubin: 0.3 mg/dL (ref 0.3–1.2)
Total Protein: 7.7 g/dL (ref 6.5–8.1)

## 2020-07-18 LAB — RETICULOCYTES
Immature Retic Fract: 11.8 % (ref 2.3–15.9)
RBC.: 2.6 MIL/uL — ABNORMAL LOW (ref 3.87–5.11)
Retic Count, Absolute: 33.5 10*3/uL (ref 19.0–186.0)
Retic Ct Pct: 1.3 % (ref 0.4–3.1)

## 2020-07-18 LAB — CK: Total CK: 160 U/L (ref 38–234)

## 2020-07-18 LAB — I-STAT BETA HCG BLOOD, ED (NOT ORDERABLE): I-stat hCG, quantitative: <5 m[IU]/mL (ref <5)

## 2020-07-18 LAB — LACTIC ACID, PLASMA
Lactic Acid, Venous: 2.4 mmol/L (ref 0.5–1.9)
Lactic Acid, Venous: 3.1 mmol/L (ref 0.5–1.9)
Lactic Acid, Venous: 1.2 mmol/L (ref 0.5–1.9)

## 2020-07-18 LAB — PHOSPHORUS: Phosphorus: 2.3 mg/dL — ABNORMAL LOW (ref 2.5–4.6)

## 2020-07-18 LAB — SARS CORONAVIRUS 2 BY RT PCR (HOSPITAL ORDER, PERFORMED IN ~~LOC~~ HOSPITAL LAB): SARS Coronavirus 2: NEGATIVE

## 2020-07-18 LAB — PROTIME-INR
INR: 1.1 (ref 0.8–1.2)
Prothrombin Time: 13.5 seconds (ref 11.4–15.2)

## 2020-07-18 LAB — LACTATE DEHYDROGENASE: LDH: 133 U/L (ref 98–192)

## 2020-07-18 LAB — SODIUM, URINE, RANDOM: Sodium, Ur: <10 mmol/L

## 2020-07-18 MED ORDER — SODIUM CHLORIDE 0.9 % IV BOLUS (SEPSIS)
1000.0000 mL | Freq: Once | INTRAVENOUS | Status: AC
  Administered 2020-07-18: 1000 mL via INTRAVENOUS

## 2020-07-18 MED ORDER — MAGNESIUM SULFATE IN D5W 1-5 GM/100ML-% IV SOLN
1.0000 g | Freq: Once | INTRAVENOUS | Status: AC
  Administered 2020-07-19: 1 g via INTRAVENOUS
  Filled 2020-07-18: qty 100

## 2020-07-18 MED ORDER — POTASSIUM PHOSPHATES 15 MMOLE/5ML IV SOLN
10.0000 mmol | Freq: Once | INTRAVENOUS | Status: AC
  Administered 2020-07-19: 10 mmol via INTRAVENOUS
  Filled 2020-07-18: qty 3.33

## 2020-07-18 MED ORDER — VANCOMYCIN HCL IN DEXTROSE 1-5 GM/200ML-% IV SOLN
1000.0000 mg | Freq: Once | INTRAVENOUS | Status: AC
  Administered 2020-07-18: 1000 mg via INTRAVENOUS
  Filled 2020-07-18: qty 200

## 2020-07-18 MED ORDER — DOXYCYCLINE HYCLATE 100 MG PO TABS
100.0000 mg | ORAL_TABLET | Freq: Two times a day (BID) | ORAL | Status: DC
  Administered 2020-07-18 – 2020-07-21 (×6): 100 mg via ORAL
  Filled 2020-07-18 (×6): qty 1

## 2020-07-18 MED ORDER — FENTANYL CITRATE (PF) 100 MCG/2ML IJ SOLN
25.0000 ug | Freq: Once | INTRAMUSCULAR | Status: AC
  Administered 2020-07-18: 25 ug via INTRAVENOUS
  Filled 2020-07-18: qty 2

## 2020-07-18 MED ORDER — POTASSIUM CHLORIDE 10 MEQ/100ML IV SOLN
10.0000 meq | INTRAVENOUS | Status: AC
  Administered 2020-07-18 – 2020-07-19 (×2): 10 meq via INTRAVENOUS
  Filled 2020-07-18 (×2): qty 100

## 2020-07-18 MED ORDER — ACETAMINOPHEN 325 MG PO TABS
650.0000 mg | ORAL_TABLET | Freq: Once | ORAL | Status: DC
  Filled 2020-07-18: qty 2

## 2020-07-18 MED ORDER — VANCOMYCIN HCL 500 MG/100ML IV SOLN: 500.0000 mg | INTRAVENOUS | Status: DC

## 2020-07-18 MED ORDER — SODIUM CHLORIDE 0.9 % IV SOLN
2.0000 g | INTRAVENOUS | Status: DC
  Administered 2020-07-19 – 2020-07-20 (×2): 2 g via INTRAVENOUS
  Filled 2020-07-18 (×2): qty 2

## 2020-07-18 MED ORDER — SODIUM CHLORIDE 0.9 % IV BOLUS
1000.0000 mL | Freq: Once | INTRAVENOUS | Status: AC
  Administered 2020-07-18: 1000 mL via INTRAVENOUS

## 2020-07-18 MED ORDER — FENTANYL CITRATE (PF) 100 MCG/2ML IJ SOLN
25.0000 ug | INTRAMUSCULAR | Status: DC | PRN
  Administered 2020-07-18: 25 ug via INTRAVENOUS
  Filled 2020-07-18: qty 2

## 2020-07-18 MED ORDER — LACTATED RINGERS IV SOLN: INTRAVENOUS | Status: DC

## 2020-07-18 MED ORDER — SODIUM CHLORIDE 0.9 % IV BOLUS
250.0000 mL | Freq: Once | INTRAVENOUS | Status: AC
  Administered 2020-07-18: 250 mL via INTRAVENOUS

## 2020-07-18 MED ORDER — ACETAMINOPHEN 160 MG/5ML PO SOLN
650.0000 mg | Freq: Once | ORAL | Status: AC
  Administered 2020-07-18: 650 mg via ORAL
  Filled 2020-07-18: qty 20.3

## 2020-07-18 MED ORDER — SODIUM CHLORIDE 0.9 % IV SOLN
2.0000 g | Freq: Once | INTRAVENOUS | Status: AC
  Administered 2020-07-18: 2 g via INTRAVENOUS
  Filled 2020-07-18: qty 2

## 2020-07-18 NOTE — H&P (Addendum)
Cindy [name] IRJ:[id number] DOB: 17-Jun-1991 DOA: 07/18/2020     PCP: Nicola Girt, DO   Outpatient Specialists:   NEphrology:  Dr.Bleyer, Gust Rung, MD Pulmonary    Dr. Camillo Flaming, Catalina Antigua, MD At [facility]  Patient arrived to ER on 07/18/20 at 1213 Referred by Attending Lajean Saver, MD   Patient coming from: home Lives alone,        Chief Complaint:   Chief Complaint  Patient presents with  . Cough  . Fever    HPI: Cindy [name] is a 29 y.o. female with medical history significant of Familial adenomatous polyposis, colon cancer sp resection 2016, Short bowel syndrome, Ileosotmy, CKD, sp port placement, hypomagnesemia, Nephrolethiasis, bilateral ureteral stents, anemia    Presented with fever, chills, cough Patient originally was seen on 15 August at that time CT scan of her abdomen x-ray showed nodular areas of consolidation within the right lower lung as well as right upper lobe groundglass opacity She was started on Cefdinir and azithromycin She followed up with pulmonology at [facility] on 18 August and was switched to Levaquin and prednisone was added.  Unfortunately patient did not fill those prescriptions and continue to take azithromycin and  Cefdinir she initially started to feel a little bit better but yesterday started to worsen again and developed a fever patient states she finished antibiotics yesterday. Patient continued to have fevers chest pain and cough and presented back to emergency department. Patient also endorsed abdominal pain and diarrhea.  Generalized weakness and fatigue no sick contacts. She has had repeatedly tested negative for Covid. 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  Lab Results  Component Value Date   CREATININE 3.09 (H) 07/18/2020   CREATININE 2.92 (H) 07/11/2020   CREATININE 3.16 (H) 07/06/2020    Chronic anemia - baseline hg Hemoglobin & Hematocrit  Recent Labs    07/06/20 0719 07/11/20 0903 07/18/20 1236  HGB 10.9* 9.1* 8.7*    While in ER: Chest x-ray showing persistent infiltrate Patient noted initially hypotensive down to 78/56 her baseline is in the 90s .  Tachycardic up to 136 febrile up to 102.6 Sepsis protocol was initiated Noted to have hyponatremia down to 129 hypokalemia 3.3  lactic acid initially elevated up to 3.1 Patient's white cell count up to 16.4   Hospitalist was called for admission for sepsis due to pneumonia  The following Work up has been ordered so far:  Orders Placed This Encounter  Procedures  . Culture, blood (Routine x 2)  . SARS Coronavirus 2 by RT PCR (hospital order, performed in [facility] hospital lab) Nasopharyngeal Nasopharyngeal Swab  . DG Chest 2 View  . Comprehensive metabolic panel  . CBC with Differential  . Protime-INR  . Urinalysis, Routine w reflex microscopic  . Lactic acid, plasma  . Notify Physician if pt is possible Sepsis patient  . DO NOT delay antibiotics if unable to obtain blood culture.  . Cardiac monitoring  . Vital signs  .  Code Sepsis activation.  This occurs automatically when order is signed and prioritizes pharmacy, lab, and radiology services for STAT collections and interventions.  If [facility] downtime, call [facility] [phone]) to activate Code Sepsis.  . Consult to hospitalist  ALL PATIENTS BEING ADMITTED/HAVING PROCEDURES NEED COVID-19 SCREENING  . Consult to hospitalist  ALL PATIENTS BEING ADMITTED/HAVING PROCEDURES NEED COVID-19 SCREENING  . Airborne and Contact precautions  .  I-Stat beta hCG blood, ED  . I-Stat beta hCG blood, ED  . ED EKG  . ED EKG  . Insert 2nd peripheral IV if not already present.    Following Medications were ordered in ER: Medications  sodium chloride 0.9 % bolus 1,000 mL (0 mLs Intravenous Stopped 07/18/20 1756)  ceFEPIme (MAXIPIME) 2 g in sodium chloride 0.9 % 100 mL IVPB (0 g Intravenous Stopped 07/18/20 1700)  vancomycin (VANCOCIN) IVPB 1000 mg/200 mL premix (0 mg Intravenous Stopped 07/18/20 1844)  acetaminophen (TYLENOL) 160 MG/5ML solution 650 mg (650 mg Oral Given 07/18/20 1712)  sodium chloride 0.9 % bolus 250 mL (0 mLs Intravenous Stopped 07/18/20 1939)  fentaNYL (SUBLIMAZE) injection 25 mcg (25 mcg Intravenous Given 07/18/20 1819)  sodium chloride 0.9 % bolus 1,000 mL (1,000 mLs Intravenous New Bag/Given 07/18/20 1938)        Consult Orders  (From admission, onward)         Start     Ordered   07/18/20 1943  Consult to hospitalist  ALL PATIENTS BEING ADMITTED/HAVING PROCEDURES NEED COVID-19 SCREENING  Once       Comments: ALL PATIENTS BEING ADMITTED/HAVING PROCEDURES NEED COVID-19 SCREENING  Provider:  (Not yet assigned)  Question Answer Comment  Place call to: [facility]   Reason for Consult Admit      07/18/20 1942   07/18/20 1837  Consult to hospitalist  ALL PATIENTS BEING ADMITTED/HAVING PROCEDURES NEED COVID-19 SCREENING  Once       Comments: ALL PATIENTS BEING ADMITTED/HAVING PROCEDURES NEED COVID-19 SCREENING  Provider:  (Not yet assigned)  Question Answer Comment  Place call to: [facility]   Reason for Consult Admit      07/18/20 1836          Significant initial  Findings: Abnormal Labs Reviewed  COMPREHENSIVE METABOLIC PANEL - Abnormal; Notable for the following components:      Result Value   Sodium 129 (*)    Potassium 3.3 (*)    Chloride 79 (*)    CO2 36 (*)    Glucose, Bld 121 (*)    BUN 23 (*)    Creatinine, Ser 3.09 (*)    Calcium 8.6 (*)    Albumin 3.3 (*)    GFR calc non Af  Amer 19 (*)    GFR calc Af Amer 22 (*)    All other components within normal limits  LACTIC ACID, PLASMA - Abnormal; Notable for the following components:   Lactic Acid, Venous 2.4 (*)    All other components within normal limits  CBC WITH DIFFERENTIAL/PLATELET - Abnormal; Notable for the following components:   WBC 16.4 (*)    RBC 3.08 (*)    Hemoglobin 8.7 (*)    HCT 26.6 (*)    Neutro Abs 14.2 (*)    Abs Immature Granulocytes 0.12 (*)    All other components within normal limits  URINALYSIS, ROUTINE W REFLEX MICROSCOPIC - Abnormal; Notable for the following components:   Color, Urine STRAW (*)    pH 9.0 (*)    Leukocytes,Ua TRACE (*)  Bacteria, UA RARE (*)    All other components within normal limits  LACTIC ACID, PLASMA - Abnormal; Notable for the following components:   Lactic Acid, Venous 3.1 (*)    All other components within normal limits    Otherwise labs showing:    Recent Labs  Lab 07/18/20 1236  NA 129*  K 3.3*  CO2 36*  GLUCOSE 121*  BUN 23*  CREATININE 3.09*  CALCIUM 8.6*    Cr   Stable  Lab Results  Component Value Date   CREATININE 3.09 (H) 07/18/2020   CREATININE 2.92 (H) 07/11/2020   CREATININE 3.16 (H) 07/06/2020    Recent Labs  Lab 07/18/20 1236  AST 27  ALT 29  ALKPHOS 97  BILITOT 0.3  PROT 7.7  ALBUMIN 3.3*   Lab Results  Component Value Date   CALCIUM 8.6 (L) 07/18/2020   PHOS 3.1 12/06/2016      WBC      Component Value Date/Time   WBC 16.4 (H) 07/18/2020 1236   ANC    Component Value Date/Time   NEUTROABS 14.2 (H) 07/18/2020 1236     Plt: Lab Results  Component Value Date   PLT 369 07/18/2020     Lactic Acid, Venous    Component Value Date/Time   LATICACIDVEN 3.1 (HH) 07/18/2020 1822     Procalcitonin   Ordered   COVID-19 Labs  No results for input(s): DDIMER, FERRITIN, LDH, CRP in the last 72 hours.  Lab Results  Component Value Date   SARSCOV2NAA NEGATIVE 07/18/2020   SARSCOV2NAA NEGATIVE  07/11/2020   SARSCOV2NAA NOT DETECTED 07/09/2019      HG/HCT  Down  from baseline see below    Component Value Date/Time   HGB 8.7 (L) 07/18/2020 1236   HCT 26.6 (L) 07/18/2020 1236    Cardiac Panel (last 3 results) No results for input(s): CKTOTAL, CKMB, TROPONINI, RELINDX in the last 72 hours.     ECG: Ordered Personally reviewed by me showing: HR : 107 Rhythm: Sinus tachycardia   no evidence of ischemic changes QTC 530   UA  no evidence of UTI      Urine analysis:    Component Value Date/Time   COLORURINE STRAW (A) 07/18/2020 1812   APPEARANCEUR CLEAR 07/18/2020 1812   LABSPEC 1.010 07/18/2020 1812   PHURINE 9.0 (H) 07/18/2020 1812   GLUCOSEU NEGATIVE 07/18/2020 1812   HGBUR NEGATIVE 07/18/2020 1812   BILIRUBINUR NEGATIVE 07/18/2020 1812   KETONESUR NEGATIVE 07/18/2020 1812   PROTEINUR NEGATIVE 07/18/2020 1812   UROBILINOGEN 0.2 07/29/2015 0320   NITRITE NEGATIVE 07/18/2020 1812   LEUKOCYTESUR TRACE (A) 07/18/2020 1812       Ordered   CXR -persistent infiltrate   ED Triage Vitals  Enc Vitals Group     BP 07/18/20 1222 (!) 94/53     Pulse Rate 07/18/20 1222 (!) 118     Resp 07/18/20 1225 18     Temp 07/18/20 1222 (!) 101.2 F (38.4 C)     Temp Source 07/18/20 1222 Oral     SpO2 07/18/20 1222 99 %     Weight 07/18/20 1221 95 lb (43.1 kg)     Height 07/18/20 1221 5' (1.524 m)     Head Circumference --      Peak Flow --      Pain Score 07/18/20 1232 9     Pain Loc --      Pain Edu? --      Excl. in [place]? --  [facility](89)@       Latest  Blood pressure (!) 97/53, pulse 97, temperature (!) 101.2 F (38.4 C), temperature source Oral, resp. rate (!) 21, height 5' (1.524 m), weight 43.1 kg, SpO2 99 %.    Review of Systems:    Pertinent positives include:  Fevers, chills, fatigue,  shortness of breath at rest.  dyspnea on exertion, nausea,  diarrhea,  Constitutional:  No weight loss, night sweats, weight loss  HEENT:  No headaches, Difficulty  swallowing,Tooth/dental problems,Sore throat,  No sneezing, itching, ear ache, nasal congestion, post nasal drip,  Cardio-vascular:  No chest pain, Orthopnea, PND, anasarca, dizziness, palpitations.no Bilateral lower extremity swelling  GI:  No heartburn, indigestion, abdominal pain, vomiting, change in bowel habits, loss of appetite, melena, blood in stool, hematemesis Resp:  no No excess mucus, no productive cough, No non-productive cough, No coughing up of blood.No change in color of mucus.No wheezing. Skin:  no rash or lesions. No jaundice GU:  no dysuria, change in color of urine, no urgency or frequency. No straining to urinate.  No flank pain.  Musculoskeletal:  No joint pain or no joint swelling. No decreased range of motion. No back pain.  Psych:  No change in mood or affect. No depression or anxiety. No memory loss.  Neuro: no localizing neurological complaints, no tingling, no weakness, no double vision, no gait abnormality, no slurred speech, no confusion  All systems reviewed and apart from [place] all are negative  Past Medical History:   Past Medical History:  Diagnosis Date  . Bilateral flank pain    due to ureteral stents  . Chronic hypokalemia   . CKD (chronic kidney disease), stage II   . Colon cancer ([place]) DX  2014---  ONCOLOGIST AT [facility]   DX RIGHT ACSECDING CARCINOMA IN BACKGROUND FAP AND SEVERE MALNUTRITION-- Stage 2A  (pT3, N0, M0)  s/p subtotal colecotmy and completed protectomy and ileostomy w/ revision  . Complication of anesthesia    post op aspiration pneumonia w/ lap. appy 12-23-2007 (emergent ruptured appendix)  . FAP (familial adenomatous polyposis) 09/30/2014  . Heart murmur   . History of acute renal failure    2015;  2016  . History of fetal demise, not currently pregnant    03-22-2013  stillborn at 39 wks  . History of kidney stones   . History of sepsis    admitted 12-03-2016 (post-op day 2 w/ bilateral ureteral stents and stone  manipluation) discharged 12-06-2016  for urosepsis  . Hypomagnesemia   . Ileostomy in place [facility])   . Iron deficiency anemia   . Nephrolithiasis    BILATERAL   . Normocytic anemia   . Renal cyst, left    PER CT 11-12-2016  . Urgency of urination        Past Surgical History:  Procedure Laterality Date  . COLONOSCOPY N/A 05/24/2013   Procedure: COLONOSCOPY;  Surgeon: Missy Sabins, MD;  Location: [place];  Service: Endoscopy;  Laterality: N/A;  . COMPLETION PROTECTOMY AND REVISION ILEOSTOMY/ LYSIS ADHESIONS  07-28-2014    [facility]  . CYSTOSCOPY W/ RETROGRADES Right 12/15/2016   Procedure: CYSTOSCOPY WITH RETROGRADE PYELOGRAM;  Surgeon: Alexis Frock, MD;  Location[facility];  Service: Urology;  Laterality: Right;  . CYSTOSCOPY W/ URETERAL STENT PLACEMENT Bilateral 11/01/2016   Procedure: CYSTOSCOPY WITH BILATERAL RETROGRADE PYELOGRAM BILATERAL URETERAL STENT PLACEMENT;  Surgeon: Alexis Frock, MD;  Location: [facility];  Service: Urology;  Laterality: Bilateral;  . [place]  Right 12/15/2016   Procedure: CYSTOSCOPY WITH STENT REPLACEMENT;  Surgeon: Alexis Frock, MD;  Location: [facility];  Service: Urology;  Laterality: Right;  . CYSTOSCOPY W/ URETERAL STENT REMOVAL Bilateral 12/15/2016   Procedure: CYSTOSCOPY WITH STENT REMOVAL;  Surgeon: Alexis Frock, MD;  Location[facility];  Service: Urology;  Laterality: Bilateral;  . CYSTOSCOPY WITH URETEROSCOPY Bilateral 12/01/2016   Procedure: FIRST STAGE CYSTOSCOPY WITH URETEROSCOPY,  STONE MANIPULATION and BASKETRY, STENT EXCHANGE;  Surgeon: Alexis Frock, MD;  Location: [facility];  Service: Urology;  Laterality: Bilateral;  . CYSTOSCOPY WITH URETEROSCOPY Right 12/15/2016   Procedure: SECOND STAGE -CYSTOSCOPY WITH URETEROSCOPY AND STONE EXTRACTION WITH BASKET;  Surgeon: Alexis Frock, MD;  [facility];  Service: Urology;   Laterality: Right;  . HOLMIUM LASER APPLICATION Bilateral 03/27/7000   Procedure: HOLMIUM LASER APPLICATION;  Surgeon: Alexis Frock, MD;  Location: [facility];  Service: Urology;  Laterality: Bilateral;  . HOLMIUM LASER APPLICATION Right 7/49/4496   Procedure: HOLMIUM LASER APPLICATION;  Surgeon: Alexis Frock, MD;  Location: [facility];  Service: Urology;  Laterality: Right;  . I & D EXTREMITY Left 11/14/2013   Procedure: IRRIGATION AND DEBRIDEMENT LEFT LONG FINGER;  Surgeon: Tennis Must, MD;  Location: [place];  Service: Orthopedics;  Laterality: Left;  . IM NAILING TIBIA Left 08/23/2010  . LAPAROSCOPIC APPENDECTOMY  12/23/2007  . LEG RECONSTRUCTION USING FASCIAL FLAP  ~ 2009  . SUBTOTAL COLECTOMY /  RESECTION ENBLOC DISTAL ILEUM/  ILEOSTOMY  07/ 2014  [facility]  . TRANSTHORACIC ECHOCARDIOGRAM  11/11/2011   ef 55-60%/  mild MR/  trivial PR and TR/  PASP 29-18mHg/  trivial pericardial effusion identified    Social History:  Ambulatory   independently      reports that she has never smoked. She has never used smokeless tobacco. She reports that she does not drink alcohol and does not use drugs.    Family History:   Family History  Adopted: Yes  Problem Relation Age of Onset  . ALS Mother   . Healthy Father   . Alcohol abuse Neg Hx   . Arthritis Neg Hx   . Asthma Neg Hx   . Birth defects Neg Hx   . Cancer Neg Hx   . COPD Neg Hx   . Depression Neg Hx   . Diabetes Neg Hx   . Drug abuse Neg Hx   . Early death Neg Hx   . Hearing loss Neg Hx   . Heart disease Neg Hx   . Hyperlipidemia Neg Hx   . Hypertension Neg Hx   . Kidney disease Neg Hx   . Learning disabilities Neg Hx   . Mental illness Neg Hx   . Mental retardation Neg Hx   . Miscarriages / Stillbirths Neg Hx   . Stroke Neg Hx   . Vision loss Neg Hx     Allergies: Allergies  Allergen Reactions  . Food Anaphylaxis and Other (See Comments)    Orange juice  . Iron  Palpitations    Heart stops  . Venofer [Ferric Oxide] Anaphylaxis  . Soap Hives, Itching and Other (See Comments)    Dove soap  . Morphine And Related Swelling    When given IV, swelling around site  . Sulfa Antibiotics Other (See Comments)    dehydration  . Sulfamethoxazole-Trimethoprim Nausea And Vomiting    Patient developed AKI that appears to have been from bactrim.  She might be able to take again if watched closely and it is absolutely needed - ie there was no anaphylaxis or allergic rash  . Levaquin [Levofloxacin] Hives     Prior to Admission medications   Medication Sig Start Date End Date Taking? Authorizing Provider  acetaminophen (TYLENOL) 325 MG tablet Take 650 mg by mouth every 6 (six) hours as needed for mild pain or headache.   Yes [provider]  benzonatate (TESSALON) 200 MG capsule Take 200 mg by mouth 3 (three) times daily as needed for cough.  07/13/20  Yes [provider]  cyanocobalamin (,VITAMIN B-12,) 1000 MCG/ML injection Inject 1,000 mcg into the muscle once a week.    Yes [provider]  guaiFENesin-codeine 100-10 MG/5ML syrup Take 5 mLs by mouth 2 (two) times daily as needed for cough.  07/13/20  Yes [provider]  medroxyPROGESTERone (DEPO-PROVERA) 150 MG/ML injection Inject 150 mg into the muscle every 3 (three) months.   Yes [provider]  Multiple Vitamins-Minerals (MULTIVITAMIN GUMMIES ADULT) CHEW Chew 2 each by mouth daily.    Yes [provider]  ondansetron (ZOFRAN-ODT) 4 MG disintegrating tablet Take 4 mg by mouth every 8 (eight) hours as needed for nausea or vomiting.  07/13/20  Yes [provider]  potassium chloride SA (KLOR-CON) 20 MEQ tablet Take 20 mEq by mouth 2 (two) times daily.   Yes [provider]  predniSONE (STERAPRED UNI-PAK 48 TAB) 10 MG (48) TBPK tablet Take 2-6 tablets by mouth as directed. Days 1-4: Take 2 tablets before breakfast, Take 1 tablet before lunch,  Take 1 tablet after supper, Take 2 tablets at bedtime  Day 5-8: Take 1 tablet before breakfast, Take 1 tablet before lunch, Take 1 tablet after supper, Take 1 tablet at bedtime  Days 9-12: Take 1 tablet before breakfast and Take 1 tablet at bedtime 07/14/20  Yes [provider]  Vitamin D, Ergocalciferol, (DRISDOL) 50000 units CAPS capsule Take 50,000 Units by mouth every Monday.  05/22/16  Yes [provider]  cefdinir (OMNICEF) 300 MG capsule Take 1 capsule (300 mg total) by mouth 2 (two) times daily for 9 days. Patient not taking: Reported on 07/18/2020 07/11/20 07/20/20  Breck Coons, MD  cetirizine (ZYRTEC) 10 MG tablet Take 1 tablet (10 mg total) by mouth daily. Patient not taking: Reported on 12/16/2019 07/09/19   Orvan July, NP   Physical Exam: Vitals with BMI 07/18/2020 07/18/2020 07/18/2020  Height - - -  Weight - - -  BMI - - -  Systolic 97 93 364  Diastolic 53 55 86  Pulse 97 117 138    1. General:  in No Acute distress    acutely ill -appearing 2. Psychological: Alert and Oriented 3. Head/ENT:   Dry Mucous Membranes                          Head Non traumatic, neck supple                            Poor Dentition 4. SKIN:  decreased Skin turgor,  Skin clean Dry and intact no rash 5. Heart: Regular rate and rhythm no  Murmur, no Rub or gallop 6. Lungs:  , no wheezes or crackles   7. Abdomen: Soft,mildly tender, Non distended  bowel sounds present, ileostomy in place 8. Lower extremities: no clubbing, cyanosis, no edema 9. Neurologically  Grossly intact, moving all 4 extremities equally  10. MSK: Normal range of motion   All other LABS:     Recent Labs  Lab 07/18/20 1236  WBC 16.4*  NEUTROABS 14.2*  HGB 8.7*  HCT 26.6*  MCV 86.4  PLT 369     Recent Labs  Lab 07/18/20 1236  NA 129*  K 3.3*  CL 79*  CO2 36*  GLUCOSE 121*  BUN 23*  CREATININE 3.09*  CALCIUM 8.6*     Recent Labs  Lab 07/18/20 1236  AST 27  ALT 29  ALKPHOS 97    BILITOT 0.3  PROT 7.7  ALBUMIN 3.3*       Cultures:    Component Value Date/Time   SDES  04/15/2020 0243    URINE, RANDOM Performed at [facility], [place] [address] [place], [place] [address]    SPECREQUEST  04/15/2020 0243    NONE Performed at [facility], [place] [address] [place],  [address]    CULT >=100,000 COLONIES/mL KLEBSIELLA PNEUMONIAE (A) 04/15/2020 0243   REPTSTATUS 04/17/2020 FINAL 04/15/2020 0243     Radiological Exams on Admission: DG Chest 2 View  Result Date: 07/18/2020 CLINICAL DATA:  Suspected sepsis EXAM: CHEST - 2 VIEW COMPARISON:  July 11, 2020 FINDINGS: Persistent mild infiltrate in the lateral right lung base. The heart, hila, mediastinum, and pleura are normal. No pneumothorax. Stable right Port-A-Cath. No other lung abnormalities noted. IMPRESSION: Persistent mild infiltrate in the lateral right lung base. No other change. Electronically Signed   By: Dorise Bullion III M.D   On: 07/18/2020 13:10    Chart has been reviewed   Assessment/Plan   29 y.o. female with medical history significant of Familial adenomatous polyposis, colon cancer sp resection 2016, Short bowel syndrome, Ileosotmy, CKD, sp port placement, hypomagnesemia, Nephrolethiasis, bilateral ureteral stents, anemia Admitted for recurrent Pneumonia  Present on Admission: . Pneumonia -  - will admit for treatment of CAP will start on appropriate antibiotic coverage. Given failure of outpatient treatment will broaden to cefepime and vancomycin add doxycycline for atypical coverage Hold off on azithromycin given QT prolongation   Obtain:  sputum cultures,                  Obtain respiratory panel                   blood cultures                   strep pneumo UA antigen,                  given diarrhea also check for Legionella.                Provide oxygen as needed currently yon RA                Obtain CT chest                COVID negative X2                 Check HIV status               MRSA screen               If no improvement would benefit from ID consult   . Sepsis, unspecified organism ([place]) -   -SIRS criteria met with  elevated white blood cell count,       Component  Value Date/Time   WBC 16.4 (H) 07/18/2020 1236   LYMPHSABS 1.3 07/18/2020 1236     tachycardia   ,  fever   RR >20 Today's Vitals   07/18/20 1730 07/18/20 1756 07/18/20 1830 07/18/20 1844  BP: (!) 93/55  (!) 97/53   Pulse: (!) 117  97   Resp: (!) 25  (!) 21   Temp:      TempSrc:      SpO2: 100%  99%   Weight:      Height:      PainSc:  9   5      This patient meets SIRS Criteria and may be septic.    The recent clinical data is shown below. Vitals:   07/18/20 1630 07/18/20 1704 07/18/20 1730 07/18/20 1830  BP: (!) 86/53 125/86 (!) 93/55 (!) 97/53  Pulse: 95 (!) 138 (!) 117 97  Resp: 19 (!) 24 (!) 25 (!) 21  Temp:      TempSrc:      SpO2: 100% 100% 100% 99%  Weight:      Height:         The patient is noted to have a MAP's <65/ SBP's <90. With the current information available to me, I don't think the patient is in septic shock. The MAP's <65/ SBP's <90, is related to  hypovolemia and improved with aggressive fluid resuscitation.     -Most likely source being: pulmonary    Patient meeting criteria for Severe sepsis with    evidence of end organ damage/organ dysfunction such as   elevated lactic acid >2     Component Value Date/Time   LATICACIDVEN 3.1 (HH) 07/18/2020 1822      SBP<90 mmhg or MAP < 65 mmhg,     - Obtain serial lactic acid and procalcitonin level.  - Initiated IV antibiotics given failure of outpatient treatment will broaden to cefepime and Vanco  - await results of blood and urine culture  - Rehydrate aggressively       30cc/kg fluid given in ER   9:22 PM  . Hyponatremia - likely due to dehydration  - order urine electrolytes, Gently rehydrate with normal saline   -Frequent labs Check TSH    . CKD (chronic  kidney disease), stage II  -chronic avoid nephrotoxic medications  . Dehydration -we will rehydrate and follow fluid status  . FAP (familial adenomatous polyposis) -patient needs to reestablish care with oncology and primary oncologist of her reportedly retired  . Hypokalemia - - will replace and repeat in AM,  check magnesium level and replace as needed  . Lower abdominal pain -patient reports similar pain in the setting of illness.  Recent CT scan unremarkable.  Continue to follow supportively   . Microcytic anemia obtain anemia panel  . Prolonged QT interval - - will monitor on tele avoid QT prolonging medications, rehydrate correct electrolytes  Hypomagnesemia will replace recheck in a.m.  Hypophosphatemia replace and recheck in a.m.   Other plan as per orders.  DVT prophylaxis:     Lovenox       Code Status:    Code Status: Prior FULL CODE  as per patient   I had personally discussed CODE STATUS with patient    Family Communication:   Family not at  Bedside   Disposition Plan:    To home once workup is complete and patient is stable   Following barriers for discharge:  Electrolytes corrected                               Anemia stable                             Pain controlled with PO medications                               Afebrile, white count improving able to transition to PO antibiotics                             Will need to be able to tolerate PO                                                      Will need consultants to evaluate patient prior to discharge                      Would benefit from PT/OT eval prior to DC  Ordered                                      Nutrition    consulted                 Consults called: if no improvement would benefit from Id/pulmonology consult   Admission status:  ED Disposition    ED Disposition Condition [place]: [place] [[id number]]  Level of Care:  Stepdown [14]  Admit to SDU based on following criteria: Hemodynamic compromise or significant risk of instability:  Patient requiring short term acute titration and management of vasoactive drips, and invasive monitoring (i.e., CVP and Arterial line).  May admit patient to [facility] or [facility] if equivalent level of care is available:: No  Covid Evaluation: Symptomatic Person Under Investigation (PUI)  Diagnosis: Pneumonia [[id number]]  Admitting Physician: Toy Baker [[id number]]  Attending Physician: Toy Baker [[id number]]  Estimated length of stay: past midnight tomorrow  Certification:: I certify this patient will need inpatient services for at least 2 midnights          inpatient     I Expect 2 midnight stay secondary to severity of patient's current illness need for inpatient interventions justified by the following:   hemodynamic instability despite optimal treatment (tachycardia  *hypotension )   Severe lab/radiological/exam abnormalities including:    CAP and extensive comorbidities including:  Chronic pain .     CKD malignancy,   That are currently affecting medical management.   I expect  patient to be hospitalized for 2 midnights requiring inpatient medical care.  Patient is at high risk for adverse outcome (such as loss of life or disability) if not treated.  Indication for inpatient stay as follows:    Hemodynamic instability despite maximal medical therapy,   Need for IV antibiotics, IV fluids,    Level of care     SDU tele indefinitely please discontinue once patient no longer qualifies COVID-19 Labs    Lab Results  Component Value Date   [place] NEGATIVE 07/18/2020     Precautions: admitted as   PUI   Airborne and Contact precautions    PPE: Used by the provider:   P100  eye Goggles,  Gloves      Anis Degidio 07/18/2020, 9:19 PM    [facility]     after 2 AM please page floor coverage PA If 7AM-7PM, please contact  the day team taking care of the patient using [email or web address]   Patient was evaluated in the context of the global COVID-19 pandemic, which necessitated consideration that the patient might be at risk for infection with the SARS-CoV-2 virus that causes COVID-19. Institutional protocols and algorithms that pertain to the evaluation of patients at risk for COVID-19 are in a state of rapid change based on information released by regulatory bodies including the CDC and federal and state organizations. These policies and algorithms were followed during the patient's care.

## 2020-07-18 NOTE — ED Notes (Signed)
Blood culture #2 was unsuccessful. Nurse aware.

## 2020-07-18 NOTE — Discharge Instructions (Addendum)
29 year old female comes in for new onset fever, cough after being treated for CAP with cefdinir and azithromycin.  She was offered admission at that time, but declined.  States symptoms was improving with medication until today.  At triage, she is hypotensive at 78/56, normal seems to trend in low 90s.  Temp of 102.6, HR136, RR 20 O2 sat 100%  Although vitals could be reactive to temperature, worries for possible sepsis especially due to complicated medical history. Also worries for need of admission with new onset fever after abx. Will discharge in stable condition to the ED for further evaluation and management needed.

## 2020-07-18 NOTE — ED Provider Notes (Signed)
[place] DEPT Provider Note   CSN: [id number] Arrival date & time: 07/18/20  1213     History Chief Complaint  Patient presents with   Cough   Fever    Cindy [name] is a 29 y.o. female with a past medical history of stage II  CKD, presenting to the ED with a chief complaint of continued cough and fever.  She was seen and evaluated on 07/11/2020, diagnosed with pneumonia and placed on cefdinir and azithromycin.  She has completed both of these medications but continues to have chills and dry cough.  Reports generalized abdominal pain and diarrhea as well. Reports generalized weakness and fatigue.  Reports intermittent chest pain.  Denies any sick contacts with similar symptoms.  She did have a negative Covid test on 07/11/2020, she has not been vaccinated against Covid.  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 appy 12-23-2007 (emergent ruptured appendix)   FAP (familial adenomatous polyposis) 09/30/2014   Heart murmur    History of acute renal failure    2015;  2016   History of fetal demise, not currently pregnant    03-22-2013  stillborn at 9 wks   History of kidney stones    History of sepsis    admitted 12-03-2016 (post-op day 2 w/ bilateral ureteral stents and stone manipluation) discharged 12-06-2016  for urosepsis   Hypomagnesemia    Ileostomy in place [facility])    Iron deficiency anemia    Nephrolithiasis    BILATERAL    Normocytic anemia    Renal cyst, left    PER CT 11-12-2016   Urgency of urination      Patient Active Problem List   Diagnosis Date Noted   Lower abdominal pain 07/13/2018   Elevated lipase 07/13/2018   AKI (acute kidney injury) ([place]) 09/02/2017   Dehydration 06/07/2017   Sepsis, unspecified organism ([place]) 12/03/2016   CKD (chronic kidney disease), stage II 12/03/2016   Normocytic anemia 05/02/2015   Hypotension    High output ileostomy ([place]) 09/30/2014   FAP (familial adenomatous polyposis) 09/30/2014   Nephrolithiasis 09/30/2014   Adenocarcinoma of colon ([place]) 05/26/2013   Nausea with vomiting 05/21/2013   Microcytic anemia 04/25/2013   Hypokalemia 04/25/2013    Past Surgical History:  Procedure Laterality Date   COLONOSCOPY N/A 05/24/2013   Procedure: COLONOSCOPY;  Surgeon: Missy Sabins, MD;  Location: [place];  Service: Endoscopy;  Laterality: N/A;   COMPLETION PROTECTOMY AND REVISION ILEOSTOMY/ LYSIS ADHESIONS  07-28-2014    [facility]   CYSTOSCOPY W/ RETROGRADES Right 12/15/2016   Procedure: CYSTOSCOPY WITH RETROGRADE PYELOGRAM;  Surgeon: Alexis Frock, MD;  Location: [facility];  Service: Urology;  Laterality: Right;   CYSTOSCOPY W/ URETERAL STENT PLACEMENT Bilateral 11/01/2016   Procedure: CYSTOSCOPY WITH BILATERAL RETROGRADE PYELOGRAM BILATERAL URETERAL STENT PLACEMENT;  Surgeon: Alexis Frock, MD;  Location: [facility];  Service: Urology;  Laterality: Bilateral;   CYSTOSCOPY W/ URETERAL STENT PLACEMENT Right 12/15/2016   Procedure: CYSTOSCOPY WITH STENT REPLACEMENT;  Surgeon: Alexis Frock, MD;  Location: [facility];  Service: Urology;  Laterality: Right;   CYSTOSCOPY W/ URETERAL STENT REMOVAL Bilateral 12/15/2016   Procedure: CYSTOSCOPY WITH STENT REMOVAL;  Surgeon: Alexis Frock, MD;  Location: [facility];  Service: Urology;  Laterality: Bilateral;   CYSTOSCOPY WITH URETEROSCOPY Bilateral 12/01/2016   Procedure: FIRST STAGE CYSTOSCOPY WITH URETEROSCOPY,  STONE MANIPULATION and BASKETRY,  STENT EXCHANGE;  Surgeon: Alexis Frock, MD;  Location: [facility];  Service: Urology;  Laterality: Bilateral;   CYSTOSCOPY WITH URETEROSCOPY Right 12/15/2016   Procedure: SECOND STAGE -CYSTOSCOPY WITH URETEROSCOPY AND STONE EXTRACTION WITH BASKET;  Surgeon: Alexis Frock, MD;  Location: [facility];  Service: Urology;  Laterality: Right;   HOLMIUM LASER APPLICATION Bilateral 0/05/6225   Procedure: HOLMIUM LASER APPLICATION;  Surgeon: Alexis Frock, MD;  Location: [facility];  Service: Urology;  Laterality: Bilateral;   HOLMIUM LASER APPLICATION Right 3/33/5456   Procedure: HOLMIUM LASER APPLICATION;  Surgeon: Alexis Frock, MD;  Location: [facility];  Service: Urology;  Laterality: Right;   I & D EXTREMITY Left 11/14/2013   Procedure: IRRIGATION AND DEBRIDEMENT LEFT LONG FINGER;  Surgeon: Tennis Must, MD;  Location: [place];  Service: Orthopedics;  Laterality: Left;   IM NAILING TIBIA Left 08/23/2010   LAPAROSCOPIC APPENDECTOMY  12/23/2007   LEG RECONSTRUCTION USING FASCIAL FLAP  ~ 2009   SUBTOTAL COLECTOMY /  RESECTION ENBLOC DISTAL ILEUM/  ILEOSTOMY  07/ 2014  [facility]   TRANSTHORACIC ECHOCARDIOGRAM  11/11/2011   ef 55-60%/  mild MR/  trivial PR and TR/  PASP 29-66mmHg/  trivial pericardial effusion identified     OB History    Gravida  1   Para  1   Term  0   Preterm  1   AB  0   Living        SAB  0   TAB  0   Ectopic  0   Multiple      Live Births              Family History  Adopted: Yes  Problem Relation Age of Onset   ALS Mother    Healthy Father    Alcohol abuse Neg Hx    Arthritis Neg Hx    Asthma Neg Hx    Birth defects Neg Hx    Cancer Neg Hx    COPD Neg Hx    Depression Neg Hx    Diabetes Neg Hx    Drug abuse Neg Hx    Early death Neg Hx    Hearing loss Neg Hx    Heart disease Neg Hx    Hyperlipidemia Neg Hx    Hypertension Neg Hx     Kidney disease Neg Hx    Learning disabilities Neg Hx    Mental illness Neg Hx    Mental retardation Neg Hx    Miscarriages / Stillbirths Neg Hx    Stroke Neg Hx    Vision loss Neg Hx     Social History   Tobacco Use   Smoking status: Never Smoker   Smokeless tobacco: Never Used  Scientific laboratory technician Use: Never used  Substance Use Topics   Alcohol use: No   Drug use: No    Home Medications Prior to Admission medications   Medication Sig Start Date End Date Taking? Authorizing Provider  acetaminophen (TYLENOL) 325 MG tablet Take 650 mg by  mouth every 6 (six) hours as needed for mild pain or headache.   Yes [provider]  benzonatate (TESSALON) 200 MG capsule Take 200 mg by mouth 3 (three) times daily as needed for cough.  07/13/20  Yes [provider]  cyanocobalamin (,VITAMIN B-12,) 1000 MCG/ML injection Inject 1,000 mcg into the muscle once a week.    Yes [provider]  guaiFENesin-codeine 100-10 MG/5ML syrup Take 5 mLs by mouth 2 (two) times daily as needed for cough.  07/13/20  Yes [provider]  medroxyPROGESTERone (DEPO-PROVERA) 150 MG/ML injection Inject 150 mg into the muscle every 3 (three) months.   Yes [provider]  Multiple Vitamins-Minerals (MULTIVITAMIN GUMMIES ADULT) CHEW Chew 2 each by mouth daily.    Yes [provider]  ondansetron (ZOFRAN-ODT) 4 MG disintegrating tablet Take 4 mg by mouth every 8 (eight) hours as needed for nausea or vomiting.  07/13/20  Yes [provider]  potassium chloride SA (KLOR-CON) 20 MEQ tablet Take 20 mEq by mouth 2 (two) times daily.   Yes [provider]  predniSONE (STERAPRED UNI-PAK 48 TAB) 10 MG (48) TBPK tablet Take 2-6 tablets by mouth as directed. Days 1-4: Take 2 tablets before breakfast, Take 1 tablet before lunch, Take 1 tablet after supper, Take 2 tablets at bedtime  Day 5-8: Take 1 tablet before breakfast, Take 1 tablet before lunch, Take  1 tablet after supper, Take 1 tablet at bedtime  Days 9-12: Take 1 tablet before breakfast and Take 1 tablet at bedtime 07/14/20  Yes [provider]  Vitamin D, Ergocalciferol, (DRISDOL) 50000 units CAPS capsule Take 50,000 Units by mouth every Monday.  05/22/16  Yes [provider]  cefdinir (OMNICEF) 300 MG capsule Take 1 capsule (300 mg total) by mouth 2 (two) times daily for 9 days. Patient not taking: Reported on 07/18/2020 07/11/20 07/20/20  Breck Coons, MD  cetirizine (ZYRTEC) 10 MG tablet Take 1 tablet (10 mg total) by mouth daily. Patient not taking: Reported on 12/16/2019 07/09/19   Orvan July, NP    Allergies    Food, Iron, Venofer [ferric oxide], Soap, Morphine and related, Sulfa antibiotics, Sulfamethoxazole-trimethoprim, and Levaquin [levofloxacin]  Review of Systems   Review of Systems  Constitutional: Positive for appetite change, chills, fatigue and fever.  HENT: Negative for ear pain, rhinorrhea, sneezing and sore throat.   Eyes: Negative for photophobia and visual disturbance.  Respiratory: Positive for cough and chest tightness. Negative for shortness of breath and wheezing.   Cardiovascular: Negative for chest pain and palpitations.  Gastrointestinal: Positive for abdominal pain and diarrhea. Negative for blood in stool, constipation, nausea and vomiting.  Genitourinary: Negative for dysuria, hematuria and urgency.  Musculoskeletal: Negative for myalgias.  Skin: Negative for rash.  Neurological: Negative for dizziness, weakness and light-headedness.    Physical Exam Updated Vital Signs BP (!) 97/53    Pulse 97    Temp (!) 101.2 F (38.4 C) (Oral)    Resp (!) 21    Ht 5' (1.524 m)    Wt 43.1 kg    SpO2 99%    BMI 18.55 kg/m   Physical Exam Vitals and nursing note reviewed.  Constitutional:      General: She is not in acute distress.    Appearance: She is well-developed.  HENT:     Head: Normocephalic and atraumatic.     Nose: Nose normal.    Eyes:     General: No scleral icterus.  Right eye: No discharge.        Left eye: No discharge.     Conjunctiva/sclera: Conjunctivae normal.  Cardiovascular:     Rate and Rhythm: Regular rhythm. Tachycardia present.     Heart sounds: Normal heart sounds. No murmur heard.  No friction rub. No gallop.   Pulmonary:     Effort: Pulmonary effort is normal. No respiratory distress.     Breath sounds: Normal breath sounds.  Abdominal:     General: Bowel sounds are normal. There is no distension.     Palpations: Abdomen is soft.     Tenderness: There is no abdominal tenderness. There is no guarding.  Musculoskeletal:        General: Normal range of motion.     Cervical back: Normal range of motion and neck supple.  Skin:    General: Skin is warm and dry.     Findings: No rash.  Neurological:     Mental Status: She is alert.     Motor: No abnormal muscle tone.     Coordination: Coordination normal.     ED Results / Procedures / Treatments   Labs (all labs ordered are listed, but only abnormal results are displayed) Labs Reviewed  COMPREHENSIVE METABOLIC PANEL - Abnormal; Notable for the following components:      Result Value   Sodium 129 (*)    Potassium 3.3 (*)    Chloride 79 (*)    CO2 36 (*)    Glucose, Bld 121 (*)    BUN 23 (*)    Creatinine, Ser 3.09 (*)    Calcium 8.6 (*)    Albumin 3.3 (*)    GFR calc non Af Amer 19 (*)    GFR calc Af Amer 22 (*)    All other components within normal limits  LACTIC ACID, PLASMA - Abnormal; Notable for the following components:   Lactic Acid, Venous 2.4 (*)    All other components within normal limits  CBC WITH DIFFERENTIAL/PLATELET - Abnormal; Notable for the following components:   WBC 16.4 (*)    RBC 3.08 (*)    Hemoglobin 8.7 (*)    HCT 26.6 (*)    Neutro Abs 14.2 (*)    Abs Immature Granulocytes 0.12 (*)    All other components within normal limits  URINALYSIS, ROUTINE W REFLEX MICROSCOPIC - Abnormal; Notable for  the following components:   Color, Urine STRAW (*)    pH 9.0 (*)    Leukocytes,Ua TRACE (*)    Bacteria, UA RARE (*)    All other components within normal limits  LACTIC ACID, PLASMA - Abnormal; Notable for the following components:   Lactic Acid, Venous 3.1 (*)    All other components within normal limits  SARS CORONAVIRUS 2 BY RT PCR (HOSPITAL ORDER, [facility])  CULTURE, BLOOD (ROUTINE X 2)  CULTURE, BLOOD (ROUTINE X 2)  PROTIME-INR  I-STAT BETA HCG BLOOD, ED ([facility], [facility], [facility] ONLY)  I-STAT BETA HCG BLOOD, ED (NOT ORDERABLE)    EKG EKG Interpretation  Date/Time:  Sunday July 18 2020 12:27:14 EDT Ventricular Rate:  107 PR Interval:    QRS Duration: 75 QT Interval:  397 QTC Calculation: 530 R Axis:   71 Text Interpretation: Sinus tachycardia Prolonged QT interval Nonspecific ST abnormality Confirmed by Lajean Saver [phone]) on 07/18/2020 3:25:12 PM   Radiology DG Chest 2 View  Result Date: 07/18/2020 CLINICAL DATA:  Suspected sepsis EXAM: CHEST - 2 VIEW COMPARISON:  July 11, 2020 FINDINGS: Persistent mild infiltrate in the lateral right lung base. The heart, hila, mediastinum, and pleura are normal. No pneumothorax. Stable right Port-A-Cath. No other lung abnormalities noted. IMPRESSION: Persistent mild infiltrate in the lateral right lung base. No other change. Electronically Signed   By: Dorise Bullion III M.D   On: 07/18/2020 13:10    Procedures .Critical Care Performed by: Delia Heady, PA-C Authorized by: Delia Heady, PA-C   Critical care provider statement:    Critical care time (minutes):  35   Critical care was necessary to treat or prevent imminent or life-threatening deterioration of the following conditions:  Cardiac failure, sepsis and respiratory failure   Critical care was time spent personally by me on the following activities:  Development of treatment plan with patient or surrogate, discussions with consultants, evaluation of  patient's response to treatment, examination of patient, obtaining history from patient or surrogate, ordering and performing treatments and interventions, ordering and review of laboratory studies, ordering and review of radiographic studies, pulse oximetry, re-evaluation of patient's condition and review of old charts   I assumed direction of critical care for this patient from another provider in my specialty: no     (including critical care time)  Medications Ordered in ED Medications  sodium chloride 0.9 % bolus 1,000 mL (0 mLs Intravenous Stopped 07/18/20 1756)  ceFEPIme (MAXIPIME) 2 g in sodium chloride 0.9 % 100 mL IVPB (0 g Intravenous Stopped 07/18/20 1700)  vancomycin (VANCOCIN) IVPB 1000 mg/200 mL premix (0 mg Intravenous Stopped 07/18/20 1844)  acetaminophen (TYLENOL) 160 MG/5ML solution 650 mg (650 mg Oral Given 07/18/20 1712)  sodium chloride 0.9 % bolus 250 mL (0 mLs Intravenous Stopped 07/18/20 1939)  fentaNYL (SUBLIMAZE) injection 25 mcg (25 mcg Intravenous Given 07/18/20 1819)  sodium chloride 0.9 % bolus 1,000 mL (1,000 mLs Intravenous New Bag/Given 07/18/20 1938)    ED Course  I have reviewed the triage vital signs and the nursing notes.  Pertinent labs & imaging results that were available during my care of the patient were reviewed by me and considered in my medical decision making (see chart for details).  Clinical Course as of Jul 18 2016  Nancy Fetter Jul 18, 2020  1511 Creatinine(!): 3.09 [HK]  1511 Lactic Acid, Venous(!!): 2.4 [HK]  1512 WBC(!): 16.4 [HK]  1920 Lactic Acid, Venous(!!): 3.1 [HK]  1923 SARS Coronavirus 2: NEGATIVE [HK]    Clinical Course User Index [HK] Delia Heady, PA-C   MDM Rules/Calculators/A&P                          Cindy [name] was evaluated in Emergency Department on 07/18/20 for the symptoms described in the history of present illness. He/she was evaluated in the context of the global COVID-19 pandemic, which necessitated consideration that  the patient might be at risk for infection with the SARS-CoV-2 virus that causes COVID-19. Institutional protocols and algorithms that pertain to the evaluation of patients at risk for COVID-19 are in a state of rapid change based on information released by regulatory bodies including the CDC and federal and state organizations. These policies and algorithms were followed during the patient's care in the ED.  29 year old female with a past medical history of colon cancer status post resection, stage II CKD presenting to the ED with a chief complaint of cough and fever.  Evaluated about 1 week ago, diagnosed with pneumonia and placed on cefdinir and azithromycin.  Completed both these  medications yesterday but continues to be symptomatic.  Reports abdominal pain and diarrhea as well as generalized weakness and fatigue.  Negative Covid test last week.  On exam patient is tachycardic, febrile. Her systolic blood pressures tend to run in the 90s which is apparent today.  Abdomen is soft, nontender nondistended.  Work-up here significant for creatinine of 3.09 which is slightly higher than her baseline.  Leukocytosis of 16, initial lactic level of 2.4 which elevated to 3.1.  Her chest x-ray shows persistent pneumonia.  EKG here shows sinus tachycardia.  Blood cultures were obtained.  She was started on IV antibiotics including vancomycin and cefepime.  She was also given IV fluids. Tachycardia has improved.  She will need to be admitted for sepsis in the setting of pneumonia and acute on chronic kidney disease. Patient agreeable to admission at this time.   Portions of this note were generated with [employer]. Dictation errors may occur despite best attempts at proofreading.  Final Clinical Impression(s) / ED Diagnoses Final diagnoses:  Sepsis with acute renal failure without septic shock, due to unspecified organism, unspecified acute renal failure type [facility])  Community acquired pneumonia of  right lower lobe of lung    Rx / DC Orders ED Discharge Orders    None       Delia Heady, PA-C 07/18/20 2017    Lajean Saver, MD 07/18/20 2237

## 2020-07-18 NOTE — ED Notes (Signed)
Patient has port to right chest. Port already accessed when patient arrived. Patient states she has port due to dehydration and that port was accessed Wednesday by home health nurse and will stay accessed for 1 week. Writer changed tegaderm. 22g IV placed in right wrist - 2nd set of cultures drawn from wrist site.

## 2020-07-18 NOTE — Progress Notes (Addendum)
Pharmacy Antibiotic Note  Cindy [name] is a 29 y.o. female admitted on 07/18/2020 with sepsis secondary to pneumomia. Pharmacy has been consulted for Vancomycin and Cefepime dosing.  Plan: Vancomycin 1g IV x 1 given in the ED. Continue with Vancomycin 500mg  IV q48h Vancomycin trough level at steady state, as indicated Cefepime 2g IV q24h Doxycycline per MD Monitor renal function, cultures, clinical course  Height: 5' (152.4 cm) Weight: 43.1 kg (95 lb) IBW/kg (Calculated) : 45.5  Temp (24hrs), Avg:101.9 F (38.8 C), Min:101.2 F (38.4 C), Max:102.6 F (39.2 C)  Recent Labs  Lab 07/18/20 1236 07/18/20 1822  WBC 16.4*  --   CREATININE 3.09*  --   LATICACIDVEN 2.4* 3.1*    Estimated Creatinine Clearance: 17.9 mL/min (A) (by C-G formula based on SCr of 3.09 mg/dL (H)).    Allergies  Allergen Reactions  . Food Anaphylaxis and Other (See Comments)    Orange juice  . Iron Palpitations    Heart stops  . Venofer [Ferric Oxide] Anaphylaxis  . Soap Hives, Itching and Other (See Comments)    Dove soap  . Morphine And Related Swelling    When given IV, swelling around site  . Sulfa Antibiotics Other (See Comments)    dehydration  . Sulfamethoxazole-Trimethoprim Nausea And Vomiting    Patient developed AKI that appears to have been from bactrim. She might be able to take again if watched closely and it is absolutely needed - ie there was no anaphylaxis or allergic rash  . Levaquin [Levofloxacin] Hives    Antimicrobials this admission: 8/22 Vancomycin >> 8/22 Cefepime >> 8/22 Doxycycline >>  Dose adjustments this admission: --  Microbiology results: 8/22 BCx: sent 8/22 COVID: negative 8/22 Respiratory panel: ordered 8/22 Strep pneumo ur ag: IP 8/22 Legionella ur ag: IP Sputum: ordered   Thank you for allowing pharmacy to be a part of this patient's care.  Luiz Ochoa 07/18/2020 8:56 PM

## 2020-07-18 NOTE — ED Triage Notes (Signed)
Pt is here for evaluation and follow up of pneumonia diagnosis from one week ago.  She states she had began to feel better until yesterday, persistent coughing and now a fever today.

## 2020-07-18 NOTE — ED Notes (Signed)
MD notified of need for antibiotic.

## 2020-07-18 NOTE — Progress Notes (Signed)
A consult was received from an ED physician for vancomycin and cefepime per pharmacy dosing.  The patient's profile has been reviewed for ht/wt/allergies/indication/available labs.    A one time order has been placed for cefepime 2 g IV once + vancomycin 1000 mg IV once.    Further antibiotics/pharmacy consults should be ordered by admitting physician if indicated.                       Thank you, Lenis Noon, PharmD 07/18/2020  3:11 PM

## 2020-07-18 NOTE — ED Provider Notes (Signed)
[facility]    CSN: [id number] Arrival date & time: 07/18/20  1014      History   Chief Complaint Chief Complaint  Patient presents with  . Pneumonia  . Fever  . Cough    HPI Cindy [name] is a 29 y.o. female.   29 year old female with history as below comes in for cough and fever that started today. She was diagnosed with CAP 07/11/2020, offered admission but declined and put on cefdinir and azithromycin. States symptoms were improving until she finished abx yesterday. Now with fever, cough, weakness. No dizziness. Has still been eating and drinking. Ileostomy site without swelling, redness. Denies urinary changes.      Past Medical History:  Diagnosis Date  . Bilateral flank pain    due to ureteral stents  . Chronic hypokalemia   . CKD (chronic kidney disease), stage II   . 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 Complication of anesthesia    post op aspiration pneumonia w/ lap. appy 12-23-2007 (emergent ruptured appendix)  . FAP (familial adenomatous polyposis) 09/30/2014  . Heart murmur   . History of acute renal failure    2015;  2016  . History of fetal demise, not currently pregnant    03-22-2013  stillborn at 12 wks  . History of kidney stones   . History of sepsis    admitted 12-03-2016 (post-op day 2 w/ bilateral ureteral stents and stone manipluation) discharged 12-06-2016  for urosepsis  . Hypomagnesemia   . Ileostomy in place [facility])   . Iron deficiency anemia   . Nephrolithiasis    BILATERAL   . Normocytic anemia   . Renal cyst, left    PER CT 11-12-2016  . Urgency of urination     Patient Active Problem List   Diagnosis Date Noted  . Lower abdominal pain 07/13/2018  . Elevated lipase 07/13/2018  . AKI (acute kidney injury) ([place]) 09/02/2017  .  Dehydration 06/07/2017  . Sepsis, unspecified organism ([place]) 12/03/2016  . 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 Normocytic anemia 05/02/2015  . Hypotension   . High output ileostomy ([place]) 09/30/2014  . FAP (familial adenomatous polyposis) 09/30/2014  . Nephrolithiasis 09/30/2014  . Adenocarcinoma of colon ([place]) 05/26/2013  . Nausea with vomiting 05/21/2013  . Microcytic anemia 04/25/2013  . Hypokalemia 04/25/2013    Past Surgical History:  Procedure Laterality Date  . COLONOSCOPY N/A 05/24/2013   Procedure: COLONOSCOPY;  Surgeon: Missy Sabins, MD;  Location: [place];  Service: Endoscopy;  Laterality: N/A;  . COMPLETION PROTECTOMY AND REVISION ILEOSTOMY/ LYSIS ADHESIONS  07-28-2014    [facility]  . CYSTOSCOPY W/ RETROGRADES Right 12/15/2016   Procedure: CYSTOSCOPY WITH RETROGRADE PYELOGRAM;  Surgeon: Alexis Frock, MD;  Location: [facility];  Service: Urology;  Laterality: Right;  . CYSTOSCOPY W/ URETERAL STENT PLACEMENT Bilateral 11/01/2016   Procedure: CYSTOSCOPY WITH BILATERAL RETROGRADE PYELOGRAM BILATERAL URETERAL STENT PLACEMENT;  Surgeon: Alexis Frock, MD;  Location: [facility];  Service: Urology;  Laterality: Bilateral;  . CYSTOSCOPY W/ URETERAL STENT PLACEMENT Right 12/15/2016   Procedure: CYSTOSCOPY WITH STENT REPLACEMENT;  Surgeon: Alexis Frock, MD;  Location: [facility];  Service: Urology;  Laterality: Right;  . CYSTOSCOPY W/ URETERAL STENT REMOVAL Bilateral 12/15/2016   Procedure: CYSTOSCOPY  WITH STENT REMOVAL;  Surgeon: Alexis Frock, MD;  Location: [facility];  Service: Urology;  Laterality: Bilateral;  . CYSTOSCOPY WITH URETEROSCOPY Bilateral 12/01/2016   Procedure: FIRST STAGE CYSTOSCOPY WITH URETEROSCOPY,  STONE MANIPULATION and BASKETRY, STENT EXCHANGE;  Surgeon: Alexis Frock, MD;  Location: [facility];  Service: Urology;  Laterality: Bilateral;  . CYSTOSCOPY WITH URETEROSCOPY Right 12/15/2016    Procedure: SECOND STAGE -CYSTOSCOPY WITH URETEROSCOPY AND STONE EXTRACTION WITH BASKET;  Surgeon: Alexis Frock, MD;  Location: [facility];  Service: Urology;  Laterality: Right;  . HOLMIUM LASER APPLICATION Bilateral 0/07/3817   Procedure: HOLMIUM LASER APPLICATION;  Surgeon: Alexis Frock, MD;  Location: [facility];  Service: Urology;  Laterality: Bilateral;  . HOLMIUM LASER APPLICATION Right 2/99/3716   Procedure: HOLMIUM LASER APPLICATION;  Surgeon: Alexis Frock, MD;  Location: [facility];  Service: Urology;  Laterality: Right;  . I & D EXTREMITY Left 11/14/2013   Procedure: IRRIGATION AND DEBRIDEMENT LEFT LONG FINGER;  Surgeon: Tennis Must, MD;  Location: [place];  Service: Orthopedics;  Laterality: Left;  . IM NAILING TIBIA Left 08/23/2010  . LAPAROSCOPIC APPENDECTOMY  12/23/2007  . LEG RECONSTRUCTION USING FASCIAL FLAP  ~ 2009  . SUBTOTAL COLECTOMY /  RESECTION ENBLOC DISTAL ILEUM/  ILEOSTOMY  07/ 2014  [facility]  . TRANSTHORACIC ECHOCARDIOGRAM  11/11/2011   ef 55-60%/  mild MR/  trivial PR and TR/  PASP 29-57mmHg/  trivial pericardial effusion identified    OB History    Gravida  1   Para  1   Term  0   Preterm  1   AB  0   Living        SAB  0   TAB  0   Ectopic  0   Multiple      Live Births               Home Medications    Prior to Admission medications   Medication Sig Start Date End Date Taking? Authorizing Provider  acetaminophen (TYLENOL) 325 MG tablet Take 650 mg by mouth every 6 (six) hours as needed for mild pain or headache.    [provider]  cefdinir (OMNICEF) 300 MG capsule Take 1 capsule (300 mg total) by mouth 2 (two) times daily for 9 days. 07/11/20 07/20/20  Breck Coons, MD  cetirizine (ZYRTEC) 10 MG tablet Take 1 tablet (10 mg total) by mouth daily. Patient not taking: Reported on 12/16/2019 07/09/19   Loura Halt A, NP  cyanocobalamin (,VITAMIN B-12,) 1000 MCG/ML  injection Inject 1,000 mcg into the muscle once a week.     [provider]  medroxyPROGESTERone (DEPO-PROVERA) 150 MG/ML injection Inject 150 mg into the muscle every 3 (three) months.    [provider]  Multiple Vitamins-Minerals (MULTIVITAMIN GUMMIES ADULT) CHEW Chew 2 each by mouth daily.     [provider]  potassium chloride SA (KLOR-CON) 20 MEQ tablet Take 20 mEq by mouth 2 (two) times daily.    [provider]  Vitamin D, Ergocalciferol, (DRISDOL) 50000 units CAPS capsule Take 50,000 Units by mouth every Monday.  05/22/16   [provider]    Family History Family History  Adopted: Yes  Problem Relation Age of Onset  . ALS Mother   . Healthy Father   . Alcohol abuse Neg Hx   . Arthritis Neg Hx   . Asthma Neg Hx   . Birth  defects Neg Hx   . Cancer Neg Hx   . COPD Neg Hx   . Depression Neg Hx   . Diabetes Neg Hx   . Drug abuse Neg Hx   . Early death Neg Hx   . Hearing loss Neg Hx   . Heart disease Neg Hx   . Hyperlipidemia Neg Hx   . Hypertension Neg Hx   . Kidney disease Neg Hx   . Learning disabilities Neg Hx   . Mental illness Neg Hx   . Mental retardation Neg Hx   . Miscarriages / Stillbirths Neg Hx   . Stroke Neg Hx   . Vision loss Neg Hx     Social History Social History   Tobacco Use  . Smoking status: Never Smoker  . Smokeless tobacco: Never Used  Vaping Use  . Vaping Use: Never used  Substance Use Topics  . Alcohol use: No  . Drug use: No     Allergies   Food, Iron, Venofer [ferric oxide], Soap, Morphine and related, Sulfa antibiotics, Sulfamethoxazole-trimethoprim, and Levaquin [levofloxacin]   Review of Systems Review of Systems  Reason unable to perform ROS: See HPI as above.     Physical Exam Triage Vital Signs ED Triage Vitals  Enc Vitals Group     BP 07/18/20 1033 (!) 78/56     Pulse Rate 07/18/20 1033 (!) 136     Resp 07/18/20 1033 20     Temp 07/18/20 1033 (!) 102.6 F (39.2 C)      Temp Source 07/18/20 1033 Oral     SpO2 07/18/20 1033 100 %     Weight --      Height --      Head Circumference --      Peak Flow --      Pain Score 07/18/20 1043 9     Pain Loc --      Pain Edu? --      Excl. in [place]? --    No data found.  Updated Vital Signs BP (!) 78/56 (BP Location: Left Arm)   Pulse (!) 136   Temp (!) 102.6 F (39.2 C) (Oral)   Resp 20   SpO2 100%   Physical Exam Constitutional:      General: She is not in acute distress.    Appearance: She is well-developed. She is not toxic-appearing or diaphoretic.  HENT:     Head: Normocephalic and atraumatic.  Eyes:     Conjunctiva/sclera: Conjunctivae normal.     Pupils: Pupils are equal, round, and reactive to light.  Cardiovascular:     Rate and Rhythm: Regular rhythm. Tachycardia present.  Pulmonary:     Effort: Pulmonary effort is normal. No respiratory distress.     Comments: LCTAB Musculoskeletal:     Cervical back: Normal range of motion and neck supple.  Skin:    General: Skin is warm and dry.  Neurological:     Mental Status: She is alert and oriented to person, place, and time.      UC Treatments / Results  Labs (all labs ordered are listed, but only abnormal results are displayed) Labs Reviewed - No data to display  EKG   Radiology No results found.  Procedures Procedures (including critical care time)  Medications Ordered in UC Medications - No data to display  Initial Impression / Assessment and Plan / UC Course  I have reviewed the triage vital signs and the nursing notes.  Pertinent labs & imaging results  that were available during my care of the patient were reviewed by me and considered in my medical decision making (see chart for details).    29 year old female comes in for new onset fever, cough after being treated for CAP with cefdinir and azithromycin.  She was offered admission at that time, but declined.  States symptoms was improving with medication until  today.  At triage, she is hypotensive at 78/56, normal seems to trend in low 90s.  Temp of 102.6, HR136, RR 20 O2 sat 100%  Although vitals could be reactive to temperature, worries for possible sepsis especially due to complicated medical history.  Will discharge in stable condition to the ED for further evaluation and management needed.  Final Clinical Impressions(s) / UC Diagnoses   Final diagnoses:  Fever, unspecified  Cough   ED Prescriptions    None     PDMP not reviewed this encounter.   Ok Edwards, PA-C 07/18/20 1103

## 2020-07-18 NOTE — ED Notes (Signed)
Provided pt with saltines and water at [facility] approval.

## 2020-07-18 NOTE — ED Triage Notes (Addendum)
Patient reports she was diagnosed with pneumonia last week. Went to urgent care because her cough is getting worse and has fever. Went to Urgent Care but was told they could not do anything. Patient diagnosed with pneumonia at [facility] last week and given antibiotics and has finished those. Patient denies covid exposure, tested last week and was negative. Says bp typically runs low.

## 2020-07-19 ENCOUNTER — Encounter (HOSPITAL_COMMUNITY): Payer: Self-pay | Admitting: Internal Medicine

## 2020-07-19 ENCOUNTER — Inpatient Hospital Stay: Admission: RE | Admit: 2020-07-19 | Payer: Self-pay | Source: Ambulatory Visit

## 2020-07-19 LAB — COMPREHENSIVE METABOLIC PANEL
ALT: 24 U/L (ref 0–44)
AST: 21 U/L (ref 15–41)
Albumin: 2.6 g/dL — ABNORMAL LOW (ref 3.5–5.0)
Alkaline Phosphatase: 91 U/L (ref 38–126)
Anion gap: 12 (ref 5–15)
BUN: 18 mg/dL (ref 6–20)
CO2: 26 mmol/L (ref 22–32)
Calcium: 8.6 mg/dL — ABNORMAL LOW (ref 8.9–10.3)
Chloride: 96 mmol/L — ABNORMAL LOW (ref 98–111)
Creatinine, Ser: 2.29 mg/dL — ABNORMAL HIGH (ref 0.44–1.00)
GFR calc Af Amer: 32 mL/min — ABNORMAL LOW (ref >60)
GFR calc non Af Amer: 28 mL/min — ABNORMAL LOW (ref >60)
Glucose, Bld: 110 mg/dL — ABNORMAL HIGH (ref 70–99)
Potassium: 3 mmol/L — ABNORMAL LOW (ref 3.5–5.1)
Sodium: 134 mmol/L — ABNORMAL LOW (ref 135–145)
Total Bilirubin: 0.7 mg/dL (ref 0.3–1.2)
Total Protein: 6.9 g/dL (ref 6.5–8.1)

## 2020-07-19 LAB — BLOOD CULTURE ID PANEL (REFLEXED) - BCID2
A.calcoaceticus-baumannii: NOT DETECTED
A.calcoaceticus-baumannii: NOT DETECTED
Bacteroides fragilis: NOT DETECTED
Bacteroides fragilis: NOT DETECTED
Candida albicans: NOT DETECTED
Candida albicans: NOT DETECTED
Candida auris: NOT DETECTED
Candida auris: NOT DETECTED
Candida glabrata: NOT DETECTED
Candida glabrata: NOT DETECTED
Candida krusei: NOT DETECTED
Candida krusei: NOT DETECTED
Candida parapsilosis: NOT DETECTED
Candida parapsilosis: NOT DETECTED
Candida tropicalis: NOT DETECTED
Candida tropicalis: NOT DETECTED
Cryptococcus neoformans/gattii: NOT DETECTED
Cryptococcus neoformans/gattii: NOT DETECTED
Enterobacter cloacae complex: NOT DETECTED
Enterobacter cloacae complex: NOT DETECTED
Enterobacterales: NOT DETECTED
Enterobacterales: NOT DETECTED
Enterococcus Faecium: NOT DETECTED
Enterococcus Faecium: NOT DETECTED
Enterococcus faecalis: NOT DETECTED
Enterococcus faecalis: NOT DETECTED
Escherichia coli: NOT DETECTED
Escherichia coli: NOT DETECTED
Haemophilus influenzae: NOT DETECTED
Haemophilus influenzae: NOT DETECTED
Klebsiella aerogenes: NOT DETECTED
Klebsiella aerogenes: NOT DETECTED
Klebsiella oxytoca: NOT DETECTED
Klebsiella oxytoca: NOT DETECTED
Klebsiella pneumoniae: NOT DETECTED
Klebsiella pneumoniae: NOT DETECTED
Listeria monocytogenes: NOT DETECTED
Listeria monocytogenes: NOT DETECTED
Methicillin resistance mecA/C: DETECTED — AB
Neisseria meningitidis: NOT DETECTED
Neisseria meningitidis: NOT DETECTED
Proteus species: NOT DETECTED
Proteus species: NOT DETECTED
Pseudomonas aeruginosa: NOT DETECTED
Pseudomonas aeruginosa: NOT DETECTED
Salmonella species: NOT DETECTED
Salmonella species: NOT DETECTED
Serratia marcescens: NOT DETECTED
Serratia marcescens: NOT DETECTED
Staphylococcus aureus (BCID): NOT DETECTED
Staphylococcus aureus (BCID): NOT DETECTED
Staphylococcus epidermidis: DETECTED — AB
Staphylococcus epidermidis: NOT DETECTED
Staphylococcus lugdunensis: NOT DETECTED
Staphylococcus lugdunensis: NOT DETECTED
Staphylococcus species: DETECTED — AB
Staphylococcus species: NOT DETECTED
Stenotrophomonas maltophilia: NOT DETECTED
Stenotrophomonas maltophilia: NOT DETECTED
Streptococcus agalactiae: NOT DETECTED
Streptococcus agalactiae: NOT DETECTED
Streptococcus pneumoniae: NOT DETECTED
Streptococcus pneumoniae: NOT DETECTED
Streptococcus pyogenes: NOT DETECTED
Streptococcus pyogenes: NOT DETECTED
Streptococcus species: NOT DETECTED
Streptococcus species: NOT DETECTED

## 2020-07-19 LAB — CBC WITH DIFFERENTIAL/PLATELET
Abs Immature Granulocytes: 0.13 10*3/uL — ABNORMAL HIGH (ref 0.00–0.07)
Basophils Absolute: 0.1 10*3/uL (ref 0.0–0.1)
Basophils Relative: 0 %
Eosinophils Absolute: 0.2 10*3/uL (ref 0.0–0.5)
Eosinophils Relative: 1 %
HCT: 26.1 % — ABNORMAL LOW (ref 36.0–46.0)
Hemoglobin: 8.5 g/dL — ABNORMAL LOW (ref 12.0–15.0)
Immature Granulocytes: 1 %
Lymphocytes Relative: 13 %
Lymphs Abs: 2.3 10*3/uL (ref 0.7–4.0)
MCH: 29.1 pg (ref 26.0–34.0)
MCHC: 32.6 g/dL (ref 30.0–36.0)
MCV: 89.4 fL (ref 80.0–100.0)
Monocytes Absolute: 1.1 10*3/uL — ABNORMAL HIGH (ref 0.1–1.0)
Monocytes Relative: 6 %
Neutro Abs: 14.4 10*3/uL — ABNORMAL HIGH (ref 1.7–7.7)
Neutrophils Relative %: 79 %
Platelets: 277 10*3/uL (ref 150–400)
RBC: 2.92 MIL/uL — ABNORMAL LOW (ref 3.87–5.11)
RDW: 13.1 % (ref 11.5–15.5)
WBC: 18.2 10*3/uL — ABNORMAL HIGH (ref 4.0–10.5)
nRBC: 0 % (ref 0.0–0.2)

## 2020-07-19 LAB — RESPIRATORY PANEL BY PCR

## 2020-07-19 LAB — PHOSPHORUS: Phosphorus: 3.9 mg/dL (ref 2.5–4.6)

## 2020-07-19 LAB — HIV ANTIBODY (ROUTINE TESTING W REFLEX): HIV Screen 4th Generation wRfx: NONREACTIVE

## 2020-07-19 LAB — IRON AND TIBC
Iron: 13 ug/dL — ABNORMAL LOW (ref 28–170)
Saturation Ratios: 6 % — ABNORMAL LOW (ref 10.4–31.8)
TIBC: 223 ug/dL — ABNORMAL LOW (ref 250–450)
UIBC: 210 ug/dL

## 2020-07-19 LAB — MAGNESIUM: Magnesium: 2.1 mg/dL (ref 1.7–2.4)

## 2020-07-19 LAB — PROCALCITONIN
Procalcitonin: 2.54 ng/mL
Procalcitonin: 2.52 ng/mL

## 2020-07-19 LAB — FOLATE: Folate: 23.6 ng/mL

## 2020-07-19 LAB — CREATININE, SERUM
Creatinine, Ser: 2.41 mg/dL — ABNORMAL HIGH (ref 0.44–1.00)
GFR calc Af Amer: 30 mL/min — ABNORMAL LOW
GFR calc non Af Amer: 26 mL/min — ABNORMAL LOW

## 2020-07-19 LAB — OSMOLALITY: Osmolality: 275 mOsm/kg (ref 275–295)

## 2020-07-19 LAB — PREALBUMIN: Prealbumin: 13.9 mg/dL — ABNORMAL LOW (ref 18–38)

## 2020-07-19 LAB — TSH: TSH: 0.495 u[IU]/mL (ref 0.350–4.500)

## 2020-07-19 LAB — FERRITIN: Ferritin: 305 ng/mL (ref 11–307)

## 2020-07-19 LAB — OSMOLALITY, URINE: Osmolality, Ur: 299 mosm/kg — ABNORMAL LOW (ref 300–900)

## 2020-07-19 LAB — C-REACTIVE PROTEIN: CRP: 6.6 mg/dL — ABNORMAL HIGH (ref <1.0)

## 2020-07-19 LAB — STREP PNEUMONIAE URINARY ANTIGEN: Strep Pneumo Urinary Antigen: NEGATIVE

## 2020-07-19 LAB — VITAMIN B12: Vitamin B-12: 185 pg/mL (ref 180–914)

## 2020-07-19 LAB — MRSA PCR SCREENING: MRSA by PCR: NEGATIVE

## 2020-07-19 MED ORDER — POTASSIUM CHLORIDE 20 MEQ PO PACK
40.0000 meq | PACK | Freq: Once | ORAL | Status: DC
  Administered 2020-07-19: 40 meq via ORAL
  Filled 2020-07-19: qty 2

## 2020-07-19 MED ORDER — ALTEPLASE 2 MG IJ SOLR: 2.0000 mg | Freq: Once | INTRAMUSCULAR | Status: DC

## 2020-07-19 MED ORDER — SODIUM CHLORIDE 0.9% FLUSH
3.0000 mL | Freq: Two times a day (BID) | INTRAVENOUS | Status: DC
  Administered 2020-07-19 – 2020-07-20 (×2): 3 mL via INTRAVENOUS

## 2020-07-19 MED ORDER — ENOXAPARIN SODIUM 30 MG/0.3ML ~~LOC~~ SOLN: 30.0000 mg | SUBCUTANEOUS | Status: DC

## 2020-07-19 MED ORDER — POTASSIUM CHLORIDE 20 MEQ PO PACK: 40.0000 meq | PACK | Freq: Once | ORAL | Status: DC

## 2020-07-19 MED ORDER — DOCUSATE SODIUM 100 MG PO CAPS
100.0000 mg | ORAL_CAPSULE | Freq: Two times a day (BID) | ORAL | Status: DC
  Filled 2020-07-19: qty 1

## 2020-07-19 MED ORDER — SODIUM CHLORIDE 0.9% FLUSH
10.0000 mL | Freq: Two times a day (BID) | INTRAVENOUS | Status: DC
  Administered 2020-07-20: 10 mL

## 2020-07-19 MED ORDER — ACETAMINOPHEN 325 MG PO TABS: 650.0000 mg | ORAL_TABLET | Freq: Four times a day (QID) | ORAL | Status: DC | PRN

## 2020-07-19 MED ORDER — SODIUM CHLORIDE 0.9% FLUSH: 10.0000 mL | INTRAVENOUS | Status: DC | PRN

## 2020-07-19 MED ORDER — CHLORHEXIDINE GLUCONATE CLOTH 2 % EX PADS
6.0000 | MEDICATED_PAD | Freq: Every day | CUTANEOUS | Status: DC
  Administered 2020-07-19 – 2020-07-21 (×3): 6 via TOPICAL

## 2020-07-19 MED ORDER — SODIUM CHLORIDE 0.9 % IV SOLN: INTRAVENOUS | Status: AC

## 2020-07-19 MED ORDER — POTASSIUM CHLORIDE CRYS ER 20 MEQ PO TBCR
40.0000 meq | EXTENDED_RELEASE_TABLET | ORAL | Status: AC
  Administered 2020-07-19 (×2): 40 meq via ORAL
  Filled 2020-07-19 (×2): qty 2

## 2020-07-19 MED ORDER — HEPARIN SODIUM (PORCINE) 5000 UNIT/ML IJ SOLN
5000.0000 [IU] | Freq: Two times a day (BID) | INTRAMUSCULAR | Status: DC
  Filled 2020-07-19: qty 1

## 2020-07-19 MED ORDER — BENZONATATE 100 MG PO CAPS: 200.0000 mg | ORAL_CAPSULE | Freq: Three times a day (TID) | ORAL | Status: DC | PRN

## 2020-07-19 MED ORDER — ACETAMINOPHEN 650 MG RE SUPP: 650.0000 mg | Freq: Four times a day (QID) | RECTAL | Status: DC | PRN

## 2020-07-19 MED ORDER — FLUCONAZOLE 40 MG/ML PO SUSR
200.0000 mg | Freq: Once | ORAL | Status: AC
  Administered 2020-07-19: 200 mg via ORAL
  Filled 2020-07-19: qty 5

## 2020-07-19 MED ORDER — SACCHAROMYCES BOULARDII 250 MG PO CAPS
250.0000 mg | ORAL_CAPSULE | Freq: Two times a day (BID) | ORAL | Status: DC
  Administered 2020-07-19 – 2020-07-20 (×3): 250 mg via ORAL
  Filled 2020-07-19 (×4): qty 1

## 2020-07-19 MED ORDER — GUAIFENESIN ER 600 MG PO TB12
600.0000 mg | ORAL_TABLET | Freq: Two times a day (BID) | ORAL | Status: DC
  Filled 2020-07-19 (×2): qty 1

## 2020-07-19 NOTE — Progress Notes (Signed)
PHARMACY - PHYSICIAN COMMUNICATION CRITICAL VALUE ALERT - BLOOD CULTURE IDENTIFICATION (BCID)  Cindy [name] is an 29 y.o. female who presented to [facility] on 07/18/2020 with a chief complaint of cough, fever, possible sepsis  Assessment:  1 of 3 bottles from [facility] with staph epi  Name of physician (or Provider) Contacted: kumar  Current antibiotics: cefepime, doxy  Changes to prescribed antibiotics recommended:  No changes  Results for orders placed or performed during the hospital encounter of 07/18/20  Blood Culture ID Panel (Reflexed) (Collected: 07/18/2020 12:41 PM)  Result Value Ref Range   Enterococcus faecalis NOT DETECTED NOT DETECTED   Enterococcus Faecium NOT DETECTED NOT DETECTED   Listeria monocytogenes NOT DETECTED NOT DETECTED   Staphylococcus species DETECTED (A) NOT DETECTED   Staphylococcus aureus (BCID) NOT DETECTED NOT DETECTED   Staphylococcus epidermidis DETECTED (A) NOT DETECTED   Staphylococcus lugdunensis NOT DETECTED NOT DETECTED   Streptococcus species NOT DETECTED NOT DETECTED   Streptococcus agalactiae NOT DETECTED NOT DETECTED   Streptococcus pneumoniae NOT DETECTED NOT DETECTED   Streptococcus pyogenes NOT DETECTED NOT DETECTED   A.calcoaceticus-baumannii NOT DETECTED NOT DETECTED   Bacteroides fragilis NOT DETECTED NOT DETECTED   Enterobacterales NOT DETECTED NOT DETECTED   Enterobacter cloacae complex NOT DETECTED NOT DETECTED   Escherichia coli NOT DETECTED NOT DETECTED   Klebsiella aerogenes NOT DETECTED NOT DETECTED   Klebsiella oxytoca NOT DETECTED NOT DETECTED   Klebsiella pneumoniae NOT DETECTED NOT DETECTED   Proteus species NOT DETECTED NOT DETECTED   Salmonella species NOT DETECTED NOT DETECTED   Serratia marcescens NOT DETECTED NOT DETECTED   Haemophilus influenzae NOT DETECTED NOT DETECTED   Neisseria meningitidis NOT DETECTED NOT DETECTED   Pseudomonas aeruginosa NOT DETECTED NOT DETECTED   Stenotrophomonas maltophilia NOT  DETECTED NOT DETECTED   Candida albicans NOT DETECTED NOT DETECTED   Candida auris NOT DETECTED NOT DETECTED   Candida glabrata NOT DETECTED NOT DETECTED   Candida krusei NOT DETECTED NOT DETECTED   Candida parapsilosis NOT DETECTED NOT DETECTED   Candida tropicalis NOT DETECTED NOT DETECTED   Cryptococcus neoformans/gattii NOT DETECTED NOT DETECTED   Methicillin resistance mecA/C DETECTED (A) NOT DETECTED    Cindy [name] 07/19/2020  3:51 PM

## 2020-07-19 NOTE — Progress Notes (Signed)
PROGRESS NOTE    Cindy [name]  QBH:[id number] DOB: 1991/10/23 DOA: 07/18/2020 PCP: Nicola Girt, DO    Brief Narrative:  Cindy [name] is a 29 y.o. female with medical history significant of Familial adenomatous polyposis, colon cancer,  s/p resection 2016, Short bowel syndrome, Ileosotmy, CKD, s/p port placement, hypomagnesemia, Nephrolethiasis, bilateral ureteral stents, anemia presented  In the ED with fever, chills, cough. Patient originally was seen on 15 August at that time CT scan of her abdomen,  x-ray showed nodular areas of consolidation within the right lower lung as well as right upper lobe groundglass opacity. She was discharged on Cefdinir and azithromycin. She followed up with pulmonology at [facility] on 18 August and was switched to Levaquin and prednisone was added.  Unfortunately patient did not fill those prescriptions and continue to take azithromycin and  Cefdinir.  she initially started to feel a little bit better but yesterday started to worsen again and developed a fever,  patient states she finished antibiotics yesterday. Patient continued to have fevers,  chest pain and cough and presented back to emergency department. She has had repeatedly tested negative for Covid. She is admitted for severe sepsis secondary to pneumonia and started on IV antibiotics.    Assessment & Plan:   Active Problems:   Microcytic anemia   Hypokalemia   FAP (familial adenomatous polyposis)   Sepsis, unspecified organism (HCC)   CKD (chronic kidney disease), stage II   Dehydration   Lower abdominal pain   Pneumonia   Hyponatremia   Prolonged QT interval   Hypomagnesemia   Hypophosphatemia  Severe sepsis sec. to Pneumonia -   POA : HR 109, RR 25, BP 97/67 , leucocytosis, Lactic acid 3.1 CT chest: consolidation Admit to step down ,    Started  cefepime and vancomycin , add doxycycline for atypical coverage Hold off on azithromycin given QT prolongation. Obtain:   sputum cultures, Obtain respiratory panel   Blood cultures : No growth so far                  strep pneumo UA antigen : Negative given diarrhea also check for Legionella. Provide oxygen as needed currently on RA COVID negative X2 HIV status : Negative MRSA screen : Negative.   vancomycin discontinued. If no improvement would benefit from ID consult  Hyponatremia - likely due to dehydration  - order urine electrolytes, Gently rehydrate with normal saline.  . -Frequent labs    . CKD (chronic kidney disease), stage II  -chronic, avoid nephrotoxic medications  . Dehydration -we will rehydrate and follow fluid status.  Marland Kitchen FAP (familial adenomatous polyposis) -patient needs to reestablish care with oncology and primary oncologist of her reportedly retired  . Hypokalemia -  - will replace and repeat in AM,  check magnesium level and replace as needed  . Lower abdominal pain -patient reports similar pain in the setting of illness.  Recent CT scan unremarkable.  Continue to follow supportively   . Microcytic anemia obtain anemia panel.  . Prolonged QT interval - - will monitor on tele,  avoid QT prolonging medications, rehydrate correct electrolytes  Hypomagnesemia - Resolved  Hypophosphatemia : Resolved.   DVT prophylaxis:  Code Status: Full Family Communication:  No family at bed side. Disposition Plan: Anticipated DC home once medically clear. Consultants:   None.  Procedures: None. Antimicrobials:  Anti-infectives (From admission, onward)   Start     Dose/Rate Route Frequency Ordered Stop   07/20/20 1600  vancomycin (VANCOREADY) IVPB 500 mg/100 mL  Status:  Discontinued        500 mg 100 mL/hr over 60 Minutes Intravenous Every 48 hours 07/18/20 2055 07/19/20 0930   07/19/20 1600  ceFEPIme (MAXIPIME) 2 g in sodium chloride 0.9 % 100 mL IVPB        2 g 200 mL/hr over 30 Minutes Intravenous Every 24 hours 07/18/20 2055     07/18/20 2200  doxycycline (VIBRA-TABS)  tablet 100 mg        100 mg Oral Every 12 hours 07/18/20 2137     07/18/20 1515  ceFEPIme (MAXIPIME) 2 g in sodium chloride 0.9 % 100 mL IVPB        2 g 200 mL/hr over 30 Minutes Intravenous  Once 07/18/20 1509 07/18/20 1700   07/18/20 1515  vancomycin (VANCOCIN) IVPB 1000 mg/200 mL premix        1,000 mg 200 mL/hr over 60 Minutes Intravenous  Once 07/18/20 1511 07/18/20 1844     Subjective: Patient was seen and examined at bedside.  No overnight events.  Patient reports feeling better.  She is lying comfortably denies any chest pain or shortness of breath.  Objective: Vitals:   07/19/20 1330 07/19/20 1333 07/19/20 1400 07/19/20 1438  BP: 90/62 90/62 94/60  (!) 87/55  Pulse: 90 (!) 101 89 87  Resp: 19 (!) 22 16 (!) 21  Temp:      TempSrc:      SpO2: 100% 100% 100% 100%  Weight:      Height:       No intake or output data in the 24 hours ending 07/19/20 1529 Filed Weights   07/18/20 1221  Weight: 43.1 kg    Examination:  General exam: Appears calm and comfortable  Respiratory system: Clear to auscultation. Respiratory effort normal. Cardiovascular system: S1 & S2 heard, RRR. No JVD, murmurs, rubs, gallops or clicks. No pedal edema. Gastrointestinal system: Abdomen is nondistended, soft and nontender. No organomegaly or masses felt. Normal bowel sounds heard. Central nervous system: Alert and oriented. No focal neurological deficits. Extremities: No leg swelling, No cyanosis. Skin: No rashes, lesions or ulcers Psychiatry: Judgement and insight appear normal. Mood & affect appropriate.     Data Reviewed: I have personally reviewed following labs and imaging studies  CBC: Recent Labs  Lab 07/18/20 1236 07/19/20 0718  WBC 16.4* 18.2*  NEUTROABS 14.2* 14.4*  HGB 8.7* 8.5*  HCT 26.6* 26.1*  MCV 86.4 89.4  PLT 369 785   Basic Metabolic Panel: Recent Labs  Lab 07/18/20 1236 07/18/20 2227 07/19/20 0500 07/19/20 0718  NA 129*  --   --  134*  K 3.3*  --   --  3.0*   CL 79*  --   --  96*  CO2 36*  --   --  26  GLUCOSE 121*  --   --  110*  BUN 23*  --   --  18  CREATININE 3.09*  --  2.41* 2.29*  CALCIUM 8.6*  --   --  8.6*  MG  --  1.6*  --  2.1  PHOS  --  2.3*  --  3.9   GFR: Estimated Creatinine Clearance: 24.2 mL/min (A) (by C-G formula based on SCr of 2.29 mg/dL (H)). Liver Function Tests: Recent Labs  Lab 07/18/20 1236 07/19/20 0718  AST 27 21  ALT 29 24  ALKPHOS 97 91  BILITOT 0.3 0.7  PROT 7.7 6.9  ALBUMIN 3.3* 2.6*  No results for input(s): LIPASE, AMYLASE in the last 168 hours. No results for input(s): AMMONIA in the last 168 hours. Coagulation Profile: Recent Labs  Lab 07/18/20 1236  INR 1.1   Cardiac Enzymes: Recent Labs  Lab 07/18/20 2227  CKTOTAL 160   BNP (last 3 results) No results for input(s): PROBNP in the last 8760 hours. HbA1C: No results for input(s): HGBA1C in the last 72 hours. CBG: No results for input(s): GLUCAP in the last 168 hours. Lipid Profile: No results for input(s): CHOL, HDL, LDLCALC, TRIG, CHOLHDL, LDLDIRECT in the last 72 hours. Thyroid Function Tests: Recent Labs    07/19/20 0718  TSH 0.495   Anemia Panel: Recent Labs    07/18/20 2047  VITAMINB12 185  FOLATE 23.6  FERRITIN 305  TIBC 223*  IRON 13*  RETICCTPCT 1.3   Sepsis Labs: Recent Labs  Lab 07/18/20 1236 07/18/20 1822 07/18/20 2200 07/18/20 2227 07/19/20 0500  PROCALCITON  --   --   --  2.54 2.52  LATICACIDVEN 2.4* 3.1* 1.2  --   --     Recent Results (from the past 240 hour(s))  SARS Coronavirus 2 by RT PCR (hospital order, performed in [place] hospital lab) Nasopharyngeal Nasopharyngeal Swab     Status: None   Collection Time: 07/11/20  9:26 AM   Specimen: Nasopharyngeal Swab  Result Value Ref Range Status   SARS Coronavirus 2 NEGATIVE NEGATIVE Final    Comment: (NOTE) SARS-CoV-2 target nucleic acids are NOT DETECTED.  The SARS-CoV-2 RNA is generally detectable in upper and lower respiratory  specimens during the acute phase of infection. The lowest concentration of SARS-CoV-2 viral copies this assay can detect is 250 copies / mL. A negative result does not preclude SARS-CoV-2 infection and should not be used as the sole basis for treatment or other patient management decisions.  A negative result may occur with improper specimen collection / handling, submission of specimen other than nasopharyngeal swab, presence of viral mutation(s) within the areas targeted by this assay, and inadequate number of viral copies (<250 copies / mL). A negative result must be combined with clinical observations, patient history, and epidemiological information.  Fact Sheet for Patients:   StrictlyIdeas.no  Fact Sheet for Healthcare Providers: BankingDealers.co.za  This test is not yet approved or  cleared by the [place] FDA and has been authorized for detection and/or diagnosis of SARS-CoV-2 by FDA under an Emergency Use Authorization (EUA).  This EUA will remain in effect (meaning this test can be used) for the duration of the COVID-19 declaration under Section 564(b)(1) of the Act, 21 U.S.C. section 360bbb-3(b)(1), unless the authorization is terminated or revoked sooner.  Performed at [facility], [address] [place], [place] [address]   Culture, blood (Routine x 2)     Status: None (Preliminary result)   Collection Time: 07/18/20 12:36 PM   Specimen: BLOOD  Result Value Ref Range Status   Specimen Description   Final    BLOOD RIGHT ANTECUBITAL Performed at [facility] [address] [place], [place] [address]    Special Requests   Final    BOTTLES DRAWN AEROBIC ONLY Blood Culture adequate volume Performed at [facility] [address] [place], [place] [address]    Culture   Final    NO GROWTH < 24 HOURS Performed at [facility] [address],  [place], [place] [address]    Report Status PENDING  Incomplete  Culture, blood (Routine x 2)     Status: None (Preliminary result)   Collection Time: 07/18/20 12:41 PM   Specimen: BLOOD  Result Value Ref Range Status   Specimen Description   Final    BLOOD RIGHT WRIST Performed at [facility] [address] [place], [place] [address]    Special Requests   Final    BOTTLES DRAWN AEROBIC AND ANAEROBIC Blood Culture adequate volume Performed at [facility] [address] [place], [place] [address]    Culture  Setup Time   Final    GRAM POSITIVE COCCI IN CLUSTERS ANAEROBIC BOTTLE ONLY Organism ID to follow Performed at [facility], [place] [address] [place], [place] [address]    Culture GRAM POSITIVE COCCI  Final   Report Status PENDING  Incomplete  SARS Coronavirus 2 by RT PCR (hospital order, performed in [facility] hospital lab) Nasopharyngeal Nasopharyngeal Swab     Status: None   Collection Time: 07/18/20  5:15 PM   Specimen: Nasopharyngeal Swab  Result Value Ref Range Status   SARS Coronavirus 2 NEGATIVE NEGATIVE Final    Comment: (NOTE) SARS-CoV-2 target nucleic acids are NOT DETECTED.  The SARS-CoV-2 RNA is generally detectable in upper and lower respiratory specimens during the acute phase of infection. The lowest concentration of SARS-CoV-2 viral copies this assay can detect is 250 copies / mL. A negative result does not preclude SARS-CoV-2 infection and should not be used as the sole basis for treatment or other patient management decisions.  A negative result may occur with improper specimen collection / handling, submission of specimen other than nasopharyngeal swab, presence of viral mutation(s) within the areas targeted by this assay, and inadequate number of viral copies (<250 copies / mL). A negative result must be combined with clinical observations, patient history, and epidemiological information.  Fact Sheet for  Patients:   StrictlyIdeas.no  Fact Sheet for Healthcare Providers: BankingDealers.co.za  This test is not yet approved or  cleared by the [place] FDA and has been authorized for detection and/or diagnosis of SARS-CoV-2 by FDA under an Emergency Use Authorization (EUA).  This EUA will remain in effect (meaning this test can be used) for the duration of the COVID-19 declaration under Section 564(b)(1) of the Act, 21 U.S.C. section 360bbb-3(b)(1), unless the authorization is terminated or revoked sooner.  Performed at [facility], [place] [address] [place], [place] [address]   Respiratory Panel by PCR     Status: None   Collection Time: 07/18/20  8:46 PM   Specimen: Nasopharyngeal Swab; Respiratory  Result Value Ref Range Status   Adenovirus NOT DETECTED NOT DETECTED Final   Coronavirus 229E NOT DETECTED NOT DETECTED Final    Comment: (NOTE) The Coronavirus on the Respiratory Panel, DOES NOT test for the novel  Coronavirus (2019 nCoV)    Coronavirus HKU1 NOT DETECTED NOT DETECTED Final   Coronavirus NL63 NOT DETECTED NOT DETECTED Final   Coronavirus OC43 NOT DETECTED NOT DETECTED Final   Metapneumovirus NOT DETECTED NOT DETECTED Final   Rhinovirus / Enterovirus NOT DETECTED NOT DETECTED Final   Influenza A NOT DETECTED NOT DETECTED Final   Influenza B NOT DETECTED NOT DETECTED Final   Parainfluenza Virus 1 NOT DETECTED NOT DETECTED Final   Parainfluenza Virus 2 NOT DETECTED NOT DETECTED Final   Parainfluenza Virus 3 NOT DETECTED NOT DETECTED Final   Parainfluenza Virus 4 NOT DETECTED NOT DETECTED Final   Respiratory Syncytial Virus NOT  DETECTED NOT DETECTED Final   Bordetella pertussis NOT DETECTED NOT DETECTED Final   Chlamydophila pneumoniae NOT DETECTED NOT DETECTED Final   Mycoplasma pneumoniae NOT DETECTED NOT DETECTED Final    Comment: Performed at [facility], [place] [address] [place],  [place] [address]  MRSA PCR Screening     Status: None   Collection Time: 07/18/20  9:11 PM   Specimen: Nasopharyngeal Swab  Result Value Ref Range Status   MRSA by PCR NEGATIVE NEGATIVE Final    Comment:        The GeneXpert MRSA Assay (FDA approved for NASAL specimens only), is one component of a comprehensive MRSA colonization surveillance program. It is not intended to diagnose MRSA infection nor to guide or monitor treatment for MRSA infections. Performed at [facility], [place] [address], [place], [place] [address]     Radiology Studies: DG Chest 2 View  Result Date: 07/18/2020 CLINICAL DATA:  Suspected sepsis EXAM: CHEST - 2 VIEW COMPARISON:  July 11, 2020 FINDINGS: Persistent mild infiltrate in the lateral right lung base. The heart, hila, mediastinum, and pleura are normal. No pneumothorax. Stable right Port-A-Cath. No other lung abnormalities noted. IMPRESSION: Persistent mild infiltrate in the lateral right lung base. No other change. Electronically Signed   By: Dorise Bullion III M.D   On: 07/18/2020 13:10   CT Chest Wo Contrast  Result Date: 07/18/2020 CLINICAL DATA:  Recent diagnosis of pneumonia status post antibiotic therapy, persistent cough and fever, chest pain, history of colon cancer EXAM: CT CHEST WITHOUT CONTRAST TECHNIQUE: Multidetector CT imaging of the chest was performed following the standard protocol without IV contrast. COMPARISON:  07/11/2020, 07/18/2020 FINDINGS: Cardiovascular: Unenhanced imaging of the heart and great vessels demonstrates no pericardial effusion. Normal caliber of the thoracic aorta. Right chest wall port via internal jugular approach tip at the atrial caval junction. Mediastinum/Nodes: No enlarged mediastinal or axillary lymph nodes. Thyroid gland, trachea, and esophagus demonstrate no significant findings. Lungs/Pleura: The nodular areas of airspace disease seen previously at the lung bases are again identified. An area of  nodular consolidation within the right lower lobe image 85 now measures 1.3 cm, previously having measured 2.0 cm. A 1.5 cm area of nodular consolidation in the lateral basilar segment of the right lower lobe image 90 measures 1.5 cm unchanged. A 0.5 cm nodule within the right middle lobe image 76 is unchanged. Nodular consolidation within the right upper lobe abutting the major fissure image 60 measures 1.9 cm and was not fully evaluated on the previous study. No new areas of consolidation, effusion, or pneumothorax. The central airways are patent. Upper Abdomen: Limited imaging through the upper abdomen demonstrates no acute findings. Musculoskeletal: There are no acute or destructive bony lesions. Reconstructed images demonstrate no additional findings. IMPRESSION: 1. Persistent nodular areas of consolidation at the right lung base as above. One of the areas demonstrates interval decrease in size, while the others are stable. Inflammatory/infectious etiology is favored, though follow-up imaging in 6-8 weeks after completion of antibiotic therapy is recommended to document complete resolution and exclude underlying neoplasm. Electronically Signed   By: Randa Ngo M.D.   On: 07/18/2020 22:04    Scheduled Meds: . docusate sodium  100 mg Oral BID  . doxycycline  100 mg Oral Q12H  . guaiFENesin  600 mg Oral BID  . heparin injection (subcutaneous)  5,000 Units Subcutaneous Q12H  . sodium chloride flush  3 mL Intravenous Q12H   Continuous Infusions: . sodium  chloride 75 mL/hr at 07/19/20 0836  . ceFEPime (MAXIPIME) IV       LOS: 1 day    Time spent: 35 mins.    Shawna Clamp, MD [facility]   If 7PM-7AM, please contact night-coverage

## 2020-07-20 LAB — CBC
HCT: 25.4 % — ABNORMAL LOW (ref 36.0–46.0)
Hemoglobin: 8 g/dL — ABNORMAL LOW (ref 12.0–15.0)
MCH: 28.6 pg (ref 26.0–34.0)
MCHC: 31.5 g/dL (ref 30.0–36.0)
MCV: 90.7 fL (ref 80.0–100.0)
Platelets: 275 10*3/uL (ref 150–400)
RBC: 2.8 MIL/uL — ABNORMAL LOW (ref 3.87–5.11)
RDW: 12.9 % (ref 11.5–15.5)
WBC: 9 10*3/uL (ref 4.0–10.5)
nRBC: 0 % (ref 0.0–0.2)

## 2020-07-20 LAB — LEGIONELLA PNEUMOPHILA SEROGP 1 UR AG: L. pneumophila Serogp 1 Ur Ag: NEGATIVE

## 2020-07-20 LAB — BASIC METABOLIC PANEL
Anion gap: 11 (ref 5–15)
BUN: 22 mg/dL — ABNORMAL HIGH (ref 6–20)
CO2: 19 mmol/L — ABNORMAL LOW (ref 22–32)
Calcium: 8.4 mg/dL — ABNORMAL LOW (ref 8.9–10.3)
Chloride: 100 mmol/L (ref 98–111)
Creatinine, Ser: 1.96 mg/dL — ABNORMAL HIGH (ref 0.44–1.00)
GFR calc Af Amer: 39 mL/min — ABNORMAL LOW (ref >60)
GFR calc non Af Amer: 33 mL/min — ABNORMAL LOW (ref >60)
Glucose, Bld: 101 mg/dL — ABNORMAL HIGH (ref 70–99)
Potassium: 3 mmol/L — ABNORMAL LOW (ref 3.5–5.1)
Sodium: 130 mmol/L — ABNORMAL LOW (ref 135–145)

## 2020-07-20 LAB — PROCALCITONIN: Procalcitonin: 10.7 ng/mL

## 2020-07-20 LAB — MAGNESIUM: Magnesium: 1.8 mg/dL (ref 1.7–2.4)

## 2020-07-20 LAB — PHOSPHORUS: Phosphorus: 4 mg/dL (ref 2.5–4.6)

## 2020-07-20 MED ORDER — POTASSIUM CHLORIDE CRYS ER 20 MEQ PO TBCR
40.0000 meq | EXTENDED_RELEASE_TABLET | Freq: Two times a day (BID) | ORAL | Status: DC
  Administered 2020-07-20 – 2020-07-21 (×3): 40 meq via ORAL
  Filled 2020-07-20 (×3): qty 2

## 2020-07-20 MED ORDER — KATE FARMS STANDARD 1.4 PO LIQD
325.0000 mL | Freq: Two times a day (BID) | ORAL | Status: DC
  Administered 2020-07-20 (×2): 325 mL via ORAL
  Filled 2020-07-20 (×4): qty 325

## 2020-07-20 MED ORDER — SODIUM BICARBONATE 650 MG PO TABS
650.0000 mg | ORAL_TABLET | Freq: Three times a day (TID) | ORAL | Status: DC
  Filled 2020-07-20: qty 1

## 2020-07-20 MED ORDER — SODIUM CHLORIDE 0.9 % IV SOLN: INTRAVENOUS | Status: DC

## 2020-07-20 MED ORDER — POTASSIUM CHLORIDE CRYS ER 10 MEQ PO TBCR: 40.0000 meq | EXTENDED_RELEASE_TABLET | Freq: Once | ORAL | Status: DC

## 2020-07-20 MED ORDER — PROSOURCE PLUS PO LIQD
30.0000 mL | Freq: Two times a day (BID) | ORAL | Status: DC
  Administered 2020-07-20: 30 mL via ORAL
  Filled 2020-07-20 (×2): qty 30

## 2020-07-20 MED ORDER — POTASSIUM CHLORIDE 20 MEQ PO PACK: 40.0000 meq | PACK | Freq: Two times a day (BID) | ORAL | Status: DC

## 2020-07-20 MED ORDER — POTASSIUM CHLORIDE 10 MEQ/100ML IV SOLN
10.0000 meq | INTRAVENOUS | Status: AC
  Administered 2020-07-20 (×3): 10 meq via INTRAVENOUS
  Filled 2020-07-20 (×3): qty 100

## 2020-07-20 MED ORDER — SODIUM CHLORIDE 0.9 % IV BOLUS
500.0000 mL | Freq: Once | INTRAVENOUS | Status: AC
  Administered 2020-07-20: 500 mL via INTRAVENOUS

## 2020-07-20 MED ORDER — ALBUMIN HUMAN 5 % IV SOLN
12.5000 g | Freq: Once | INTRAVENOUS | Status: AC
  Administered 2020-07-20: 12.5 g via INTRAVENOUS
  Filled 2020-07-20: qty 250

## 2020-07-20 MED ORDER — ADULT MULTIVITAMIN W/MINERALS CH
1.0000 | ORAL_TABLET | Freq: Every day | ORAL | Status: DC
  Administered 2020-07-20: 1 via ORAL
  Filled 2020-07-20 (×2): qty 1

## 2020-07-20 NOTE — Progress Notes (Signed)
On assessment of port, no blood return noted. RN stopped fluids running in port and paged IV team. IV team to bedside- awaiting on TPA from pharmacy. During that time patient removed port access due to irritation and burning. Site red and inflamed on assessment after removal- patient scratching site. Cool compress placed to help swelling and irritation.

## 2020-07-20 NOTE — Progress Notes (Signed)
Initial Nutrition Assessment  RD working remotely.   DOCUMENTATION CODES:   Not applicable  INTERVENTION:  - will order [employer] 1.4 po BID, each supplement provides 455 kcal and 20 grams protein. - will order 30 mL Prosource Plus BID, each supplement provides 100 kcal and 15 grams of protein. - will order 1 tablet multivitamin with minerals/day. - will complete NFPE when feasible.   NUTRITION DIAGNOSIS:   Increased nutrient needs related to acute illness, catabolic illness (NVBTY-60 infection) as evidenced by estimated needs  GOAL:   Patient will meet greater than or equal to 90% of their needs  MONITOR:   PO intake, Supplement acceptance, Labs, Weight trends  REASON FOR ASSESSMENT:   Consult Assessment of nutrition requirement/status, Malnutrition Eval  ASSESSMENT:   29 y.o. female with medical history of Familial adenomatous polyposis, colon cancer s/p resection 2016, short bowel syndrome, ileosotomy, stage 2 CKD, hypomagnesemia, nephrolethiasis, bilateral ureteral stents, and anemia. She presented to the ED with fever, chills, cough, abdominal pain, diarrhea, and generalized weakness and fatigue.  No intakes documented since admission. Weight has been stable/slighly up over the past 10 months. Noted patient has SBS; will order oral nutrition supplements which are often better tolerated by those with GI changes or discomforts.   Per notes: - severe sepsis 2/2 PNA - COVID negative x2 - HIV negative - dehydration on admission - hyponatremia and hypokalemia - lower abdominal pain--repeat CT negative   Labs reviewed; Na: 130 mmol/l, K: 3 mmol/l, BUN: 22 mg/dl, creatinine: 1.96 mg/dl, Ca: 8.4 mg/dl, GFR: 39 ml/min. Medications reviewed; 12.5 g albumin x1 dose 8/24, 100 mg colace BID, 40 mEq Klor-Con x2 doses 8/24 and x2 doses 8/25, 250 mg florastor BID.  IVF; NS @ 75 ml/hr.     NUTRITION - FOCUSED PHYSICAL EXAM:  unable to complete at this time.   Diet Order:    Diet Order            Diet Heart Room service appropriate? Yes; Fluid consistency: Thin  Diet effective now                 EDUCATION NEEDS:   No education needs have been identified at this time  Skin:  Skin Assessment: Reviewed RN Assessment  Last BM:  8/24  Height:   Ht Readings from Last 1 Encounters:  07/18/20 5' (1.524 m)    Weight:   Wt Readings from Last 1 Encounters:  07/18/20 43.1 kg     Estimated Nutritional Needs:  Kcal:  1600-1800 kcal Protein:  80-95 grams Fluid:  >/= 1.8 L/day     Jarome Matin, MS, RD, LDN, CNSC Inpatient Clinical Dietitian RD pager # available in [facility]  After hours/weekend pager # available in [facility]

## 2020-07-20 NOTE — Progress Notes (Signed)
RN paged [place] on call for low Bps. Orders received for 500 cc bolus and increase maintenance to 75 cc/hr.

## 2020-07-20 NOTE — Progress Notes (Signed)
Called to assess port for blood return. No blood return noted. Port deaccessed. As I was preparing to reaccess, patient was scratching at port site. Area to medial side of port reddened from her scratching. I scrubbed the area x 40 seconds with chlorhexadine before reaccessing with 20g x 1" HPN and applying sterile dressing. No blood return noted upon reaccess. Alteplase ordered.

## 2020-07-20 NOTE — Progress Notes (Signed)
Consult for c/o burning at site. Arrived at bedside to find port deaccessed by patient. Pt states "That needle was too long and that was why site was burning." Instructed patient that 1" was the only size we carry. She refused reaccess. Primary RN assessing site and at bedside during encounter.

## 2020-07-20 NOTE — Progress Notes (Signed)
PROGRESS NOTE    Cindy [name]  HER:[id number] DOB: 04/10/1991 DOA: 07/18/2020 PCP: Nicola Girt, DO    Brief Narrative:  Cindy [name] is a 29 y.o. female with medical history significant of Familial adenomatous polyposis, colon cancer,  s/p resection 2016, Short bowel syndrome, Ileosotmy, CKD, s/p port placement, hypomagnesemia, Nephrolethiasis, bilateral ureteral stents, anemia presented  In the ED with fever, chills, cough. Patient originally was seen on 15 August at that time CT scan of her abdomen,  x-ray showed nodular areas of consolidation within the right lower lung as well as right upper lobe groundglass opacity. She was discharged on Cefdinir and azithromycin. She followed up with pulmonology at [facility] on 18 August and was switched to Levaquin and prednisone was added.  Unfortunately patient did not fill those prescriptions and continue to take azithromycin and  Cefdinir.  she initially started to feel a little bit better but yesterday started to worsen again and developed a fever,  patient states she finished antibiotics yesterday. Patient continued to have fevers,  chest pain and cough and presented back to emergency department. She has had repeatedly tested negative for Covid. She is admitted for severe sepsis secondary to pneumonia and started on IV antibiotics.     Assessment & Plan:   Active Problems:   Microcytic anemia   Hypokalemia   FAP (familial adenomatous polyposis)   Sepsis, unspecified organism (HCC)   CKD (chronic kidney disease), stage II   Dehydration   Lower abdominal pain   Pneumonia   Hyponatremia   Prolonged QT interval   Hypomagnesemia   Hypophosphatemia  Severe sepsis sec. to Pneumonia -   POA : HR 109, RR 25, BP 97/67 , leucocytosis, Lactic acid 3.1, CT chest: consolidation Admitted to step down ,    Started on  cefepime and vancomycin , added doxycycline for atypical coverage. Hold off on azithromycin given QT  prolongation. sputum cultures - pending ,  respiratory panel: negative Blood cultures : No growth so far.                 strep pneumo UA antigen : Negative given diarrhea also check for Legionella. Provide oxygen as needed,  currently on RA COVID negative X2 HIV status : Negative MRSA screen : Negative.   vancomycin discontinued. If no improvement would benefit from ID consult. Leukocytosis trending down, She remains afebrile.  Hyponatremia - likely due to dehydration. Gently rehydrate with normal saline.  . -Frequent labs    . AKI on CKD (chronic kidney disease), stage III  -chronic,    avoid nephrotoxic medications.   Renal functions improving.  . Dehydration -we will rehydrate and follow fluid status.  Marland Kitchen FAP (familial adenomatous polyposis) -patient needs to reestablish care with oncology and primary oncologist of her reportedly retired  . Hypokalemia -  will replace and repeat in AM,  magnesium level normal.  . Lower abdominal pain -patient reports similar pain in the setting of illness.  Recent CT scan unremarkable.  Continue to follow supportively   . Microcytic anemia obtain anemia panel.  . Prolonged QT interval - - will monitor on tele,  avoid QT prolonging medications, rehydrate correct electrolytes.  Hypomagnesemia - Resolved  Hypophosphatemia : Resolved.   DVT prophylaxis:  Lovenox Code Status: Full Family Communication:  No family at bed side. Disposition Plan: Anticipated DC home once medically clear. Consultants:   None.  Procedures: None. Antimicrobials:  Anti-infectives (From admission, onward)   Start  Dose/Rate Route Frequency Ordered Stop   07/20/20 1600  vancomycin (VANCOREADY) IVPB 500 mg/100 mL  Status:  Discontinued        500 mg 100 mL/hr over 60 Minutes Intravenous Every 48 hours 07/18/20 2055 07/19/20 0930   07/19/20 2200  fluconazole (DIFLUCAN) 40 MG/ML suspension 200 mg        200 mg Oral  Once 07/19/20 2125 07/19/20 2251    07/19/20 1600  ceFEPIme (MAXIPIME) 2 g in sodium chloride 0.9 % 100 mL IVPB        2 g 200 mL/hr over 30 Minutes Intravenous Every 24 hours 07/18/20 2055     07/18/20 2200  doxycycline (VIBRA-TABS) tablet 100 mg        100 mg Oral Every 12 hours 07/18/20 2137     07/18/20 1515  ceFEPIme (MAXIPIME) 2 g in sodium chloride 0.9 % 100 mL IVPB        2 g 200 mL/hr over 30 Minutes Intravenous  Once 07/18/20 1509 07/18/20 1700   07/18/20 1515  vancomycin (VANCOCIN) IVPB 1000 mg/200 mL premix        1,000 mg 200 mL/hr over 60 Minutes Intravenous  Once 07/18/20 1511 07/18/20 1844     Subjective: Patient was seen and examined at bedside.  Overnight events noted,  she remained hypotensive and required IV boluses..   Patient reports feeling better, BP has improved. She is lying comfortably, denies any chest pain or shortness of breath.  Objective: Vitals:   07/20/20 0200 07/20/20 0300 07/20/20 0400 07/20/20 0505  BP: (!) 85/59  (!) 75/38 (!) 74/42  Pulse: 75 78 70 78  Resp: (!) 21 19 12 17   Temp:   98.4 F (36.9 C)   TempSrc:      SpO2: 100% 100% 100% 100%  Weight:      Height:        Intake/Output Summary (Last 24 hours) at 07/20/2020 0802 Last data filed at 07/20/2020 0000 Gross per 24 hour  Intake 97.6 ml  Output --  Net 97.6 ml   Filed Weights   07/18/20 1221  Weight: 43.1 kg    Examination:  General exam: Appears calm and comfortable  Respiratory system: Clear to auscultation. Respiratory effort normal. Cardiovascular system: S1 & S2 heard, RRR. No JVD, murmurs, rubs, gallops or clicks. No pedal edema. Gastrointestinal system: Abdomen is nondistended, soft and nontender. No organomegaly or masses felt. Normal bowel sounds heard. Central nervous system: Alert and oriented. No focal neurological deficits. Extremities: No leg swelling, No cyanosis. Skin: No rashes, lesions or ulcers Psychiatry: Judgement and insight appear normal. Mood & affect appropriate.     Data  Reviewed: I have personally reviewed following labs and imaging studies  CBC: Recent Labs  Lab 07/18/20 1236 07/19/20 0718 07/20/20 0238  WBC 16.4* 18.2* 9.0  NEUTROABS 14.2* 14.4*  --   HGB 8.7* 8.5* 8.0*  HCT 26.6* 26.1* 25.4*  MCV 86.4 89.4 90.7  PLT 369 277 026   Basic Metabolic Panel: Recent Labs  Lab 07/18/20 1236 07/18/20 2227 07/19/20 0500 07/19/20 0718 07/20/20 0238  NA 129*  --   --  134* 130*  K 3.3*  --   --  3.0* 3.0*  CL 79*  --   --  96* 100  CO2 36*  --   --  26 19*  GLUCOSE 121*  --   --  110* 101*  BUN 23*  --   --  18 22*  CREATININE 3.09*  --  2.41* 2.29* 1.96*  CALCIUM 8.6*  --   --  8.6* 8.4*  MG  --  1.6*  --  2.1 1.8  PHOS  --  2.3*  --  3.9 4.0   GFR: Estimated Creatinine Clearance: 28.3 mL/min (A) (by C-G formula based on SCr of 1.96 mg/dL (H)). Liver Function Tests: Recent Labs  Lab 07/18/20 1236 07/19/20 0718  AST 27 21  ALT 29 24  ALKPHOS 97 91  BILITOT 0.3 0.7  PROT 7.7 6.9  ALBUMIN 3.3* 2.6*   No results for input(s): LIPASE, AMYLASE in the last 168 hours. No results for input(s): AMMONIA in the last 168 hours. Coagulation Profile: Recent Labs  Lab 07/18/20 1236  INR 1.1   Cardiac Enzymes: Recent Labs  Lab 07/18/20 2227  CKTOTAL 160   BNP (last 3 results) No results for input(s): PROBNP in the last 8760 hours. HbA1C: No results for input(s): HGBA1C in the last 72 hours. CBG: No results for input(s): GLUCAP in the last 168 hours. Lipid Profile: No results for input(s): CHOL, HDL, LDLCALC, TRIG, CHOLHDL, LDLDIRECT in the last 72 hours. Thyroid Function Tests: Recent Labs    07/19/20 0718  TSH 0.495   Anemia Panel: Recent Labs    07/18/20 2047  VITAMINB12 185  FOLATE 23.6  FERRITIN 305  TIBC 223*  IRON 13*  RETICCTPCT 1.3   Sepsis Labs: Recent Labs  Lab 07/18/20 1236 07/18/20 1822 07/18/20 2200 07/18/20 2227 07/19/20 0500 07/20/20 0238  PROCALCITON  --   --   --  2.54 2.52 10.70  LATICACIDVEN  2.4* 3.1* 1.2  --   --   --     Recent Results (from the past 240 hour(s))  SARS Coronavirus 2 by RT PCR (hospital order, performed in [place] hospital lab) Nasopharyngeal Nasopharyngeal Swab     Status: None   Collection Time: 07/11/20  9:26 AM   Specimen: Nasopharyngeal Swab  Result Value Ref Range Status   SARS Coronavirus 2 NEGATIVE NEGATIVE Final    Comment: (NOTE) SARS-CoV-2 target nucleic acids are NOT DETECTED.  The SARS-CoV-2 RNA is generally detectable in upper and lower respiratory specimens during the acute phase of infection. The lowest concentration of SARS-CoV-2 viral copies this assay can detect is 250 copies / mL. A negative result does not preclude SARS-CoV-2 infection and should not be used as the sole basis for treatment or other patient management decisions.  A negative result may occur with improper specimen collection / handling, submission of specimen other than nasopharyngeal swab, presence of viral mutation(s) within the areas targeted by this assay, and inadequate number of viral copies (<250 copies / mL). A negative result must be combined with clinical observations, patient history, and epidemiological information.  Fact Sheet for Patients:   StrictlyIdeas.no  Fact Sheet for Healthcare Providers: BankingDealers.co.za  This test is not yet approved or  cleared by the [place] FDA and has been authorized for detection and/or diagnosis of SARS-CoV-2 by FDA under an Emergency Use Authorization (EUA).  This EUA will remain in effect (meaning this test can be used) for the duration of the COVID-19 declaration under Section 564(b)(1) of the Act, 21 U.S.C. section 360bbb-3(b)(1), unless the authorization is terminated or revoked sooner.  Performed at [facility], [address] [place], [place] [address]   Culture, blood (Routine x 2)     Status: None (Preliminary result)   Collection  Time: 07/18/20 12:36 PM   Specimen: BLOOD  Result Value  Ref Range Status   Specimen Description   Final    BLOOD RIGHT ANTECUBITAL Performed at [facility] [address], [place], [place] [address]    Special Requests   Final    BOTTLES DRAWN AEROBIC ONLY Blood Culture adequate volume Performed at [facility] [address] [place], [place] [address]    Culture   Final    NO GROWTH 2 DAYS Performed at [facility] [address], [place], [place] [address]    Report Status PENDING  Incomplete  Culture, blood (Routine x 2)     Status: None (Preliminary result)   Collection Time: 07/18/20 12:41 PM   Specimen: BLOOD  Result Value Ref Range Status   Specimen Description   Final    BLOOD RIGHT WRIST Performed at [facility] [address] [place], [place] [address]    Special Requests   Final    BOTTLES DRAWN AEROBIC AND ANAEROBIC Blood Culture adequate volume Performed at [facility] [address], [place], [place] [address]    Culture  Setup Time   Final    GRAM POSITIVE COCCI IN CLUSTERS ANAEROBIC BOTTLE ONLY Organism ID to follow CRITICAL RESULT CALLED TO, READ BACK BY AND VERIFIED WITH: Christean Grief PharmD 15:50 07/19/20 ([email or web address]) Performed at [facility], [address] [place], [place] [address]    Culture GRAM POSITIVE COCCI  Final   Report Status PENDING  Incomplete  Blood Culture ID Panel (Reflexed)     Status: Abnormal   Collection Time: 07/18/20 12:41 PM  Result Value Ref Range Status   Enterococcus faecalis NOT DETECTED NOT DETECTED Final   Enterococcus Faecium NOT DETECTED NOT DETECTED Final   Listeria monocytogenes NOT DETECTED NOT DETECTED Final   Staphylococcus species DETECTED (A) NOT DETECTED Final    Comment: CRITICAL RESULT CALLED TO, READ BACK BY AND VERIFIED WITH: Christean Grief PharmD 15:50 07/19/20 ([email or web address])    Staphylococcus aureus (BCID) NOT DETECTED NOT  DETECTED Final   Staphylococcus epidermidis DETECTED (A) NOT DETECTED Final    Comment: Methicillin (oxacillin) resistant coagulase negative staphylococcus. Possible blood culture contaminant (unless isolated from more than one blood culture draw or clinical case suggests pathogenicity). No antibiotic treatment is indicated for blood  culture contaminants. CRITICAL RESULT CALLED TO, READ BACK BY AND VERIFIED WITH: Christean Grief PharmD 15:50 07/19/20 ([email or web address])    Staphylococcus lugdunensis NOT DETECTED NOT DETECTED Final   Streptococcus species NOT DETECTED NOT DETECTED Final   Streptococcus agalactiae NOT DETECTED NOT DETECTED Final   Streptococcus pneumoniae NOT DETECTED NOT DETECTED Final   Streptococcus pyogenes NOT DETECTED NOT DETECTED Final   A.calcoaceticus-baumannii NOT DETECTED NOT DETECTED Final   Bacteroides fragilis NOT DETECTED NOT DETECTED Final   Enterobacterales NOT DETECTED NOT DETECTED Final   Enterobacter cloacae complex NOT DETECTED NOT DETECTED Final   Escherichia coli NOT DETECTED NOT DETECTED Final   Klebsiella aerogenes NOT DETECTED NOT DETECTED Final   Klebsiella oxytoca NOT DETECTED NOT DETECTED Final   Klebsiella pneumoniae NOT DETECTED NOT DETECTED Final   Proteus species NOT DETECTED NOT DETECTED Final   Salmonella species NOT DETECTED NOT DETECTED Final   Serratia marcescens NOT DETECTED NOT DETECTED Final   Haemophilus influenzae NOT DETECTED NOT DETECTED Final   Neisseria meningitidis NOT DETECTED NOT DETECTED Final   Pseudomonas aeruginosa NOT DETECTED NOT DETECTED Final   Stenotrophomonas maltophilia NOT DETECTED NOT DETECTED Final   Candida albicans NOT DETECTED NOT DETECTED  Final   Candida auris NOT DETECTED NOT DETECTED Final   Candida glabrata NOT DETECTED NOT DETECTED Final   Candida krusei NOT DETECTED NOT DETECTED Final   Candida parapsilosis NOT DETECTED NOT DETECTED Final   Candida tropicalis NOT DETECTED NOT DETECTED Final   Cryptococcus  neoformans/gattii NOT DETECTED NOT DETECTED Final   Methicillin resistance mecA/C DETECTED (A) NOT DETECTED Final    Comment: CRITICAL RESULT CALLED TO, READ BACK BY AND VERIFIED WITH: Christean Grief PharmD 15:50 07/19/20 ([email or web address]) Performed at [facility], [address], [place], [place] [address]   Blood Culture ID Panel (Reflexed)     Status: None   Collection Time: 07/18/20 12:41 PM  Result Value Ref Range Status   Enterococcus faecalis NOT DETECTED NOT DETECTED Final   Enterococcus Faecium NOT DETECTED NOT DETECTED Final   Listeria monocytogenes NOT DETECTED NOT DETECTED Final   Staphylococcus species NOT DETECTED NOT DETECTED Final   Staphylococcus aureus (BCID) NOT DETECTED NOT DETECTED Final   Staphylococcus epidermidis NOT DETECTED NOT DETECTED Final   Staphylococcus lugdunensis NOT DETECTED NOT DETECTED Final   Streptococcus species NOT DETECTED NOT DETECTED Final   Streptococcus agalactiae NOT DETECTED NOT DETECTED Final   Streptococcus pneumoniae NOT DETECTED NOT DETECTED Final   Streptococcus pyogenes NOT DETECTED NOT DETECTED Final   A.calcoaceticus-baumannii NOT DETECTED NOT DETECTED Final   Bacteroides fragilis NOT DETECTED NOT DETECTED Final   Enterobacterales NOT DETECTED NOT DETECTED Final   Enterobacter cloacae complex NOT DETECTED NOT DETECTED Final   Escherichia coli NOT DETECTED NOT DETECTED Final   Klebsiella aerogenes NOT DETECTED NOT DETECTED Final   Klebsiella oxytoca NOT DETECTED NOT DETECTED Final   Klebsiella pneumoniae NOT DETECTED NOT DETECTED Final   Proteus species NOT DETECTED NOT DETECTED Final   Salmonella species NOT DETECTED NOT DETECTED Final   Serratia marcescens NOT DETECTED NOT DETECTED Final   Haemophilus influenzae NOT DETECTED NOT DETECTED Final   Neisseria meningitidis NOT DETECTED NOT DETECTED Final   Pseudomonas aeruginosa NOT DETECTED NOT DETECTED Final   Stenotrophomonas maltophilia NOT DETECTED NOT DETECTED Final   Candida  albicans NOT DETECTED NOT DETECTED Final   Candida auris NOT DETECTED NOT DETECTED Final   Candida glabrata NOT DETECTED NOT DETECTED Final   Candida krusei NOT DETECTED NOT DETECTED Final   Candida parapsilosis NOT DETECTED NOT DETECTED Final   Candida tropicalis NOT DETECTED NOT DETECTED Final   Cryptococcus neoformans/gattii NOT DETECTED NOT DETECTED Final    Comment: Performed at [facility], [address], [place], [place] [address]  SARS Coronavirus 2 by RT PCR (hospital order, performed in [facility] hospital lab) Nasopharyngeal Nasopharyngeal Swab     Status: None   Collection Time: 07/18/20  5:15 PM   Specimen: Nasopharyngeal Swab  Result Value Ref Range Status   SARS Coronavirus 2 NEGATIVE NEGATIVE Final    Comment: (NOTE) SARS-CoV-2 target nucleic acids are NOT DETECTED.  The SARS-CoV-2 RNA is generally detectable in upper and lower respiratory specimens during the acute phase of infection. The lowest concentration of SARS-CoV-2 viral copies this assay can detect is 250 copies / mL. A negative result does not preclude SARS-CoV-2 infection and should not be used as the sole basis for treatment or other patient management decisions.  A negative result may occur with improper specimen collection / handling, submission of specimen other than nasopharyngeal swab, presence of viral mutation(s) within the areas targeted by this assay, and inadequate number of viral copies (<250 copies /  mL). A negative result must be combined with clinical observations, patient history, and epidemiological information.  Fact Sheet for Patients:   StrictlyIdeas.no  Fact Sheet for Healthcare Providers: BankingDealers.co.za  This test is not yet approved or  cleared by the [place] FDA and has been authorized for detection and/or diagnosis of SARS-CoV-2 by FDA under an Emergency Use Authorization (EUA).  This EUA will remain in effect  (meaning this test can be used) for the duration of the COVID-19 declaration under Section 564(b)(1) of the Act, 21 U.S.C. section 360bbb-3(b)(1), unless the authorization is terminated or revoked sooner.  Performed at [facility], [place] [address], [place], [place] [address]   Respiratory Panel by PCR     Status: None   Collection Time: 07/18/20  8:46 PM   Specimen: Nasopharyngeal Swab; Respiratory  Result Value Ref Range Status   Adenovirus NOT DETECTED NOT DETECTED Final   Coronavirus 229E NOT DETECTED NOT DETECTED Final    Comment: (NOTE) The Coronavirus on the Respiratory Panel, DOES NOT test for the novel  Coronavirus (2019 nCoV)    Coronavirus HKU1 NOT DETECTED NOT DETECTED Final   Coronavirus NL63 NOT DETECTED NOT DETECTED Final   Coronavirus OC43 NOT DETECTED NOT DETECTED Final   Metapneumovirus NOT DETECTED NOT DETECTED Final   Rhinovirus / Enterovirus NOT DETECTED NOT DETECTED Final   Influenza A NOT DETECTED NOT DETECTED Final   Influenza B NOT DETECTED NOT DETECTED Final   Parainfluenza Virus 1 NOT DETECTED NOT DETECTED Final   Parainfluenza Virus 2 NOT DETECTED NOT DETECTED Final   Parainfluenza Virus 3 NOT DETECTED NOT DETECTED Final   Parainfluenza Virus 4 NOT DETECTED NOT DETECTED Final   Respiratory Syncytial Virus NOT DETECTED NOT DETECTED Final   Bordetella pertussis NOT DETECTED NOT DETECTED Final   Chlamydophila pneumoniae NOT DETECTED NOT DETECTED Final   Mycoplasma pneumoniae NOT DETECTED NOT DETECTED Final    Comment: Performed at [facility], [place]. [address] [place], [place] [address]  MRSA PCR Screening     Status: None   Collection Time: 07/18/20  9:11 PM   Specimen: Nasopharyngeal Swab  Result Value Ref Range Status   MRSA by PCR NEGATIVE NEGATIVE Final    Comment:        The GeneXpert MRSA Assay (FDA approved for NASAL specimens only), is one component of a comprehensive MRSA colonization surveillance program. It is  not intended to diagnose MRSA infection nor to guide or monitor treatment for MRSA infections. Performed at [facility], [place] [address], [place], [place] [address]     Radiology Studies: DG Chest 2 View  Result Date: 07/18/2020 CLINICAL DATA:  Suspected sepsis EXAM: CHEST - 2 VIEW COMPARISON:  July 11, 2020 FINDINGS: Persistent mild infiltrate in the lateral right lung base. The heart, hila, mediastinum, and pleura are normal. No pneumothorax. Stable right Port-A-Cath. No other lung abnormalities noted. IMPRESSION: Persistent mild infiltrate in the lateral right lung base. No other change. Electronically Signed   By: Dorise Bullion III M.D   On: 07/18/2020 13:10   CT Chest Wo Contrast  Result Date: 07/18/2020 CLINICAL DATA:  Recent diagnosis of pneumonia status post antibiotic therapy, persistent cough and fever, chest pain, history of colon cancer EXAM: CT CHEST WITHOUT CONTRAST TECHNIQUE: Multidetector CT imaging of the chest was performed following the standard protocol without IV contrast. COMPARISON:  07/11/2020, 07/18/2020 FINDINGS: Cardiovascular: Unenhanced imaging of the heart and great vessels demonstrates no pericardial effusion. Normal caliber  of the thoracic aorta. Right chest wall port via internal jugular approach tip at the atrial caval junction. Mediastinum/Nodes: No enlarged mediastinal or axillary lymph nodes. Thyroid gland, trachea, and esophagus demonstrate no significant findings. Lungs/Pleura: The nodular areas of airspace disease seen previously at the lung bases are again identified. An area of nodular consolidation within the right lower lobe image 85 now measures 1.3 cm, previously having measured 2.0 cm. A 1.5 cm area of nodular consolidation in the lateral basilar segment of the right lower lobe image 90 measures 1.5 cm unchanged. A 0.5 cm nodule within the right middle lobe image 76 is unchanged. Nodular consolidation within the right upper lobe  abutting the major fissure image 60 measures 1.9 cm and was not fully evaluated on the previous study. No new areas of consolidation, effusion, or pneumothorax. The central airways are patent. Upper Abdomen: Limited imaging through the upper abdomen demonstrates no acute findings. Musculoskeletal: There are no acute or destructive bony lesions. Reconstructed images demonstrate no additional findings. IMPRESSION: 1. Persistent nodular areas of consolidation at the right lung base as above. One of the areas demonstrates interval decrease in size, while the others are stable. Inflammatory/infectious etiology is favored, though follow-up imaging in 6-8 weeks after completion of antibiotic therapy is recommended to document complete resolution and exclude underlying neoplasm. Electronically Signed   By: Randa Ngo M.D.   On: 07/18/2020 22:04    Scheduled Meds: . alteplase  2 mg Intracatheter Once  . Chlorhexidine Gluconate Cloth  6 each Topical Daily  . docusate sodium  100 mg Oral BID  . doxycycline  100 mg Oral Q12H  . guaiFENesin  600 mg Oral BID  . heparin injection (subcutaneous)  5,000 Units Subcutaneous Q12H  . potassium chloride  40 mEq Oral BID  . potassium chloride  40 mEq Oral Once  . saccharomyces boulardii  250 mg Oral BID  . sodium bicarbonate  650 mg Oral TID  . sodium chloride flush  10-40 mL Intracatheter Q12H  . sodium chloride flush  3 mL Intravenous Q12H   Continuous Infusions: . sodium chloride 75 mL/hr at 07/20/20 0634  . ceFEPime (MAXIPIME) IV 2 g (07/19/20 1841)  . potassium chloride 10 mEq (07/20/20 0635)     LOS: 2 days    Time spent: 25 mins.    Shawna Clamp, MD [facility]   If 7PM-7AM, please contact night-coverage

## 2020-07-20 NOTE — TOC Initial Note (Signed)
Transition of Care [facility]) - Initial/Assessment Note    Patient Details  Name: Cindy [name] MRN: [id number] Date of Birth: [birth year]  Transition of Care [facility]) CM/SW Contact:    Leeroy Cha, RN Phone Number: 07/20/2020, 7:56 AM  Clinical Narrative:                 Pneumonia via ct scan of chest From home lives alone plan is to return to home Iv abx, iv kcl runs x3 for K+ of 3.0. Following for progression and toc needs.  Expected Discharge Plan: Home/Self Care Barriers to Discharge: Continued Medical Work up   Patient Goals and CMS Choice Patient states their goals for this hospitalization and ongoing recovery are:: to go home CMS Medicare.gov Compare Post Acute Care list provided to:: Patient Choice offered to / list presented to : Patient  Expected Discharge Plan and Services Expected Discharge Plan: Home/Self Care   Discharge Planning Services: CM Consult   Living arrangements for the past 2 months: Single Family Home                                      Prior Living Arrangements/Services Living arrangements for the past 2 months: Single Family Home Lives with:: Self Patient language and need for interpreter reviewed:: Yes Do you feel safe going back to the place where you live?: Yes      Need for Family Participation in Patient Care: Yes (Comment) Care giver support system in place?: Yes (comment)   Criminal Activity/Legal Involvement Pertinent to Current Situation/Hospitalization: No - Comment as needed  Activities of Daily Living Home Assistive Devices/Equipment: None ADL Screening (condition at time of admission) Patient's cognitive ability adequate to safely complete daily activities?: Yes Is the patient deaf or have difficulty hearing?: No Does the patient have difficulty seeing, even when wearing glasses/contacts?: No Does the patient have difficulty concentrating, remembering, or making decisions?: No Patient able to express need for  assistance with ADLs?: Yes Does the patient have difficulty dressing or bathing?: No Independently performs ADLs?: Yes (appropriate for developmental age) Does the patient have difficulty walking or climbing stairs?: No Weakness of Legs: None Weakness of Arms/Hands: None  Permission Sought/Granted                  Emotional Assessment Appearance:: Appears stated age Attitude/Demeanor/Rapport: Engaged Affect (typically observed): Calm Orientation: : Oriented to Place, Oriented to Self, Oriented to  Time, Oriented to Situation Alcohol / Substance Use: Not Applicable Psych Involvement: No (comment)  Admission diagnosis:  Pneumonia [J18.9] Community acquired pneumonia of right lower lobe of lung [J18.9] Sepsis with acute renal failure without septic shock, due to unspecified organism, unspecified acute renal failure type ([place]) [A41.9, R65.20, N17.9] Patient Active Problem List   Diagnosis Date Noted  . Pneumonia 07/18/2020  . Hyponatremia 07/18/2020  . Prolonged QT interval 07/18/2020  . Hypomagnesemia 07/18/2020  . Hypophosphatemia 07/18/2020  . Lower abdominal pain 07/13/2018  . Elevated lipase 07/13/2018  . AKI (acute kidney injury) ([place]) 09/02/2017  . Dehydration 06/07/2017  . Sepsis, unspecified organism ([place]) 12/03/2016  . 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 Normocytic anemia 05/02/2015  . Hypotension   . 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 FAP (familial adenomatous polyposis) 09/30/2014  . Nephrolithiasis 09/30/2014  . Adenocarcinoma of colon ([place]) 05/26/2013  . Nausea with vomiting 05/21/2013  . Microcytic anemia 04/25/2013  .  Hypokalemia 04/25/2013   PCP:  Nicola Girt, DO Pharmacy:   [place], [place] [id number] [place] [address] Phone: [phone] Fax: [phone]  [id number] [place], [place] [address] Phone: [phone] Fax: [phone]     Social Determinants of Health (SDOH) Interventions    Readmission Risk Interventions No flowsheet data found.

## 2020-07-20 NOTE — TOC Progression Note (Signed)
Transition of Care [facility]) - Progression Note    Patient Details  Name: SHELVA HETZER MRN: [id number] Date of Birth: 27-Jul-1991  Transition of Care [facility]) CM/SW Contact  Leeroy Cha, RN Phone Number: 07/20/2020, 10:45 AM  Clinical Narrative:    [facility] active for home iv infusion for hydration. Phone is [phone].   Expected Discharge Plan: Home/Self Care Barriers to Discharge: Continued Medical Work up  Expected Discharge Plan and Services Expected Discharge Plan: Home/Self Care   Discharge Planning Services: CM Consult   Living arrangements for the past 2 months: Single Family Home                                       Social Determinants of Health (SDOH) Interventions    Readmission Risk Interventions No flowsheet data found.

## 2020-07-21 LAB — CULTURE, BLOOD (ROUTINE X 2): Special Requests: ADEQUATE

## 2020-07-21 LAB — CBC
HCT: 25.6 % — ABNORMAL LOW (ref 36.0–46.0)
Hemoglobin: 8.3 g/dL — ABNORMAL LOW (ref 12.0–15.0)
MCH: 28.9 pg (ref 26.0–34.0)
MCHC: 32.4 g/dL (ref 30.0–36.0)
MCV: 89.2 fL (ref 80.0–100.0)
Platelets: 289 10*3/uL (ref 150–400)
RBC: 2.87 MIL/uL — ABNORMAL LOW (ref 3.87–5.11)
RDW: 12.9 % (ref 11.5–15.5)
WBC: 8.5 10*3/uL (ref 4.0–10.5)
nRBC: 0 % (ref 0.0–0.2)

## 2020-07-21 LAB — BASIC METABOLIC PANEL
Anion gap: 8 (ref 5–15)
BUN: 19 mg/dL (ref 6–20)
CO2: 16 mmol/L — ABNORMAL LOW (ref 22–32)
Calcium: 9.4 mg/dL (ref 8.9–10.3)
Chloride: 113 mmol/L — ABNORMAL HIGH (ref 98–111)
Creatinine, Ser: 1.79 mg/dL — ABNORMAL HIGH (ref 0.44–1.00)
GFR calc Af Amer: 43 mL/min — ABNORMAL LOW (ref >60)
GFR calc non Af Amer: 37 mL/min — ABNORMAL LOW (ref >60)
Glucose, Bld: 98 mg/dL (ref 70–99)
Potassium: 4.3 mmol/L (ref 3.5–5.1)
Sodium: 137 mmol/L (ref 135–145)

## 2020-07-21 MED ORDER — BENZONATATE 200 MG PO CAPS: 200.0000 mg | ORAL_CAPSULE | Freq: Three times a day (TID) | ORAL | 0 refills | Status: DC | PRN

## 2020-07-21 MED ORDER — CIPROFLOXACIN IN D5W 400 MG/200ML IV SOLN
400.0000 mg | Freq: Two times a day (BID) | INTRAVENOUS | Status: DC
  Filled 2020-07-21: qty 200

## 2020-07-21 MED ORDER — DOXYCYCLINE HYCLATE 100 MG PO TABS: 100.0000 mg | ORAL_TABLET | Freq: Two times a day (BID) | ORAL | 0 refills | Status: AC

## 2020-07-21 MED ORDER — CIPROFLOXACIN HCL 750 MG PO TABS: 750.0000 mg | ORAL_TABLET | Freq: Every day | ORAL | 0 refills | Status: AC

## 2020-07-21 MED ORDER — CIPROFLOXACIN HCL 500 MG PO TABS: 750.0000 mg | ORAL_TABLET | Freq: Every day | ORAL | Status: DC

## 2020-07-21 MED ORDER — CEFUROXIME AXETIL 500 MG PO TABS: 500.0000 mg | ORAL_TABLET | Freq: Two times a day (BID) | ORAL | 0 refills | Status: DC

## 2020-07-21 MED ORDER — CIPROFLOXACIN HCL 500 MG PO TABS
500.0000 mg | ORAL_TABLET | Freq: Two times a day (BID) | ORAL | Status: DC
  Administered 2020-07-21: 500 mg via ORAL
  Filled 2020-07-21: qty 1

## 2020-07-21 MED ORDER — GUAIFENESIN-CODEINE 100-10 MG/5ML PO SOLN
5.0000 mL | Freq: Two times a day (BID) | ORAL | 0 refills | Status: DC | PRN
Start: 2020-07-21 — End: 2021-05-31

## 2020-07-21 MED ORDER — SODIUM BICARBONATE 650 MG PO TABS: 650.0000 mg | ORAL_TABLET | Freq: Three times a day (TID) | ORAL | 0 refills | Status: AC

## 2020-07-21 MED ORDER — ONDANSETRON 4 MG PO TBDP: 4.0000 mg | ORAL_TABLET | Freq: Three times a day (TID) | ORAL | 0 refills | Status: DC | PRN

## 2020-07-21 MED ORDER — SACCHAROMYCES BOULARDII 250 MG PO CAPS: 250.0000 mg | ORAL_CAPSULE | Freq: Two times a day (BID) | ORAL | 0 refills | Status: DC

## 2020-07-21 NOTE — Progress Notes (Signed)
Cipro given PO at 1247, no adverse effects as of this moment. Per Dr. Tana Coast, okay to d/c patient. Patient educated on importance of finished antibiotic regimen

## 2020-07-21 NOTE — Discharge Summary (Signed)
Physician Discharge Summary   Patient ID: Cindy [name] MRN: [id number] DOB/AGE: 1991-03-02 29 y.o.  Admit date: 07/18/2020 Discharge date: 07/21/2020  Primary Care Physician:  Cindy Girt, DO   Recommendations for Outpatient Follow-up:  1. Follow up with PCP in 1-2 weeks  Home Health: None, ambulating without any difficulty Equipment/Devices:   Discharge Condition: stable CODE STATUS: FULL Diet recommendation: Heart healthy diet   Discharge Diagnoses:    . Community-acquired pneumonia Pseudomonas bacteremia . Hyponatremia . Acute kidney injury on CKD (chronic kidney disease), stage IIIb . Dehydration . FAP (familial adenomatous polyposis) . Hypokalemia . Lower abdominal pain . Microcytic anemia . Sepsis, Pseudomonas (Cindy [name]) . Prolonged QT interval . Hypomagnesemia . Hypophosphatemia   Consults: Infectious disease, Dr. Dwyane [name] via phone consultation   Allergies:   Allergies  Allergen Reactions  . Food Anaphylaxis and Other (See Comments)    Orange juice  . Iron Palpitations    Heart stops  . Venofer [Ferric Oxide] Anaphylaxis  . Soap Hives, Itching and Other (See Comments)    Dove soap  . Morphine And Related Swelling    When given IV, swelling around site  . Sulfa Antibiotics Other (See Comments)    dehydration  . Sulfamethoxazole-Trimethoprim Nausea And Vomiting    Patient developed AKI that appears to have been from bactrim. She might be able to take again if watched closely and it is absolutely needed - ie there was no anaphylaxis or allergic rash  . Levaquin [Levofloxacin] Hives     DISCHARGE MEDICATIONS: Allergies as of 07/21/2020      Reactions   Food Anaphylaxis, Other (See Comments)   Orange juice   Iron Palpitations   Heart stops   Venofer [ferric Oxide] Anaphylaxis   Soap Hives, Itching, Other (See Comments)   Dove soap   Morphine And Related Swelling   When given IV, swelling around site   Sulfa Antibiotics Other (See Comments)    dehydration   Sulfamethoxazole-trimethoprim Nausea And Vomiting   Patient developed AKI that appears to have been from bactrim. She might be able to take again if watched closely and it is absolutely needed - ie there was no anaphylaxis or allergic rash   Levaquin [levofloxacin] Hives      Medication List    STOP taking these medications   cefdinir 300 MG capsule Commonly known as: OMNICEF   cetirizine 10 MG tablet Commonly known as: ZYRTEC   predniSONE 10 MG (48) Tbpk tablet Commonly known as: STERAPRED UNI-PAK 48 TAB     TAKE these medications   acetaminophen 325 MG tablet Commonly known as: TYLENOL Take 650 mg by mouth every 6 (six) hours as needed for mild pain or headache.   benzonatate 200 MG capsule Commonly known as: TESSALON Take 1 capsule (200 mg total) by mouth 3 (three) times daily as needed for cough.   ciprofloxacin 750 MG tablet Commonly known as: CIPRO Take 1 tablet (750 mg total) by mouth daily with breakfast for 5 days. Start taking on: July 22, 2020   cyanocobalamin 1000 MCG/ML injection Commonly known as: (VITAMIN B-12) Inject 1,000 mcg into the muscle once a week.   doxycycline 100 MG tablet Commonly known as: VIBRA-TABS Take 1 tablet (100 mg total) by mouth 2 (two) times daily for 5 days. Notes to patient: **NEW** Antibiotic to treat infection (pneumonia)   guaiFENesin-codeine 100-10 MG/5ML syrup Take 5 mLs by mouth 2 (two) times daily as needed for cough.   medroxyPROGESTERone 150 MG/ML injection Commonly  known as: DEPO-PROVERA Inject 150 mg into the muscle every 3 (three) months.   Multivitamin Gummies Adult Chew Chew 2 each by mouth daily.   ondansetron 4 MG disintegrating tablet Commonly known as: ZOFRAN-ODT Take 1 tablet (4 mg total) by mouth every 8 (eight) hours as needed for nausea or vomiting.   potassium chloride SA 20 MEQ tablet Commonly known as: KLOR-CON Take 20 mEq by mouth 2 (two) times daily.   saccharomyces  boulardii 250 MG capsule Commonly known as: FLORASTOR Take 1 capsule (250 mg total) by mouth 2 (two) times daily. Any generic probiotic is okay Notes to patient: **NEW** Dietary supplement to promote intestinal health   sodium bicarbonate 650 MG tablet Take 1 tablet (650 mg total) by mouth 3 (three) times daily for 7 days. Notes to patient: **NEW** Lower acid in blood and help blood chemical balance   Vitamin D (Ergocalciferol) 1.25 MG (50000 UNIT) Caps capsule Commonly known as: DRISDOL Take 50,000 Units by mouth every Monday.        Brief H and P: For complete details please refer to admission H and P, but in brief Cindy E Bentonis a 29 y.o.femalewith medical history significant of Familial adenomatous polyposis, colon cancer,  s/p resection 2016, Short bowel syndrome, Ileosotmy, CKD, s/p port placement, hypomagnesemia, Nephrolethiasis, bilateral ureteral stents, anemia presented  In the ED withfever, chills, cough. Patient originally was seen on 15 August at that time CT scan of her abdomen,  x-ray showed nodular areas of consolidation within the right lower lung as well as right upper lobe groundglass opacity. She was discharged on Cefdinir andazithromycin. She followed up with pulmonology at [facility] on 18August and was switched to Levaquin and prednisone was added.Unfortunately patient did not fill those prescriptions and continue to take azithromycin andCefdinir. she initially started to feel a little bit better but yesterday started to worsen again and developed a fever,  patient states she finished antibiotics yesterday. Patient continued to have fevers,  chest pain and cough and presented back to emergency department. She has had repeatedly tested negative for Covid.  Hospital Course:   Severe sepsis secondary to community-acquired pneumonia, Pseudomonas bacteremia On admission, patient was tachycardiac, hypertensive, leukocytosis, lactic acidosis.  CT chest showed  consolidation.  Patient was admitted to stepdown unit for further work-up. She was placed on  cefepime and vancomycin , added doxycycline for atypical coverage. Urine strep antigen, urine Legionella antigen negative Blood culture showed staph epidermidis, 1/2 Pseudomonas putida COVID negative X2 HIV status : Negative MRSA screen was negative, vancomycin was discontinued. Patient feels back to her baseline, sepsis physiology has resolved.  Discussed with infectious disease, Dr. Linus Salmons, recommended oral ciprofloxacin for 5 days per sensitivities. Patient tolerated first dose of ciprofloxacin while inpatient.  Patient wanted to leave, currently no acute medical issues, discharged home on oral doxycycline and ciprofloxacin for 5 days.     Hyponatremia -likely due to dehydration. Patient was placed on IV fluid hydration, sodium improved 137 at the time of discharge.   . AKI onCKD (chronic kidney disease), stage IIIb-chronic,  -Creatinine 3.09 on admission, baseline ~2  -Currently improving, creatinine 1.7 at the time of discharge.  .Dehydration -Improved, patient was placed on IV fluid hydration, currently tolerating diet without any difficulty  .FAP (familial adenomatous polyposis) - -patient needs to reestablish care with oncology and primary oncologist of her reportedly retired  .Hypokalemia -Resolved, potassium 4.3 at the time of discharge.  .Lower abdominal pain -patient reports similar pain in the setting  of illness. Recent CT scan unremarkable.  -Currently no abdominal pain  .Microcytic anemia -Anemia panel showed low iron 13, percent saturation ratio 6, ferritin 305  .Prolonged QT interval - Avoid QT prolonging meds  Hypomagnesemia- Resolved  Hypophosphatemia: Resolved.  Day of Discharge S: Wants to go home.  No overnight fevers or any other acute issues.  No nausea vomiting or abdominal pain.  BP (!) 113/55   Pulse (!) 106   Temp 98.4 F (36.9  C) (Oral)   Resp (!) 21   Ht 5' (1.524 m)   Wt 43.1 kg   SpO2 100%   BMI 18.55 kg/m   Physical Exam: General: Alert and awake oriented x3 not in any acute distress. HEENT: anicteric sclera, pupils reactive to light and accommodation CVS: S1-S2 clear no murmur rubs or gallops Chest: clear to auscultation bilaterally, no wheezing rales or rhonchi Abdomen: soft nontender, nondistended, normal bowel sounds Extremities: no cyanosis, clubbing or edema noted bilaterally Neuro: Cranial nerves II-XII intact, no focal neurological deficits    Get Medicines reviewed and adjusted: Please take all your medications with you for your next visit with your Primary MD  Please request your Primary MD to go over all hospital tests and procedure/radiological results at the follow up. Please ask your Primary MD to get all Hospital records sent to his/her office.  If you experience worsening of your admission symptoms, develop shortness of breath, life threatening emergency, suicidal or homicidal thoughts you must seek medical attention immediately by calling 911 or calling your MD immediately  if symptoms less severe.  You must read complete instructions/literature along with all the possible adverse reactions/side effects for all the Medicines you take and that have been prescribed to you. Take any new Medicines after you have completely understood and accept all the possible adverse reactions/side effects.   Do not drive when taking pain medications.   Do not take more than prescribed Pain, Sleep and Anxiety Medications  Special Instructions: If you have smoked or chewed Tobacco  in the last 2 yrs please stop smoking, stop any regular Alcohol  and or any Recreational drug use.  Wear Seat belts while driving.  Please note  You were cared for by a hospitalist during your hospital stay. Once you are discharged, your primary care physician will handle any further medical issues. Please note that NO  REFILLS for any discharge medications will be authorized once you are discharged, as it is imperative that you return to your primary care physician (or establish a relationship with a primary care physician if you do not have one) for your aftercare needs so that they can reassess your need for medications and monitor your lab values.   The results of significant diagnostics from this hospitalization (including imaging, microbiology, ancillary and laboratory) are listed below for reference.      Procedures/Studies:  CT ABDOMEN PELVIS WO CONTRAST  Result Date: 07/11/2020 CLINICAL DATA:  Nausea and vomiting. Abdominal pain. History of ileostomy. History of colon cancer. EXAM: CT ABDOMEN AND PELVIS WITHOUT CONTRAST TECHNIQUE: Multidetector CT imaging of the abdomen and pelvis was performed following the standard protocol without IV contrast. COMPARISON:  CT abdomen pelvis 12/16/2019 FINDINGS: Lower chest: Normal heart size. Interval development of a 1.5 cm subpleural nodular area of consolidation right lower lung (image 7; series 4). There is an additional adjacent 2.0 cm nodular area of consolidation right lower lung (image 7; series 4). Partially visualized ground-glass opacities right upper lobe. Small focal area of consolidation  right middle lobe. Dependent atelectasis within the bilateral lower lobes. No pleural effusion. Hepatobiliary: Liver is normal in size and contour. Gallbladder is unremarkable. Pancreas: Unremarkable Spleen: Unremarkable Adrenals/Urinary Tract: Normal adrenal glands. Punctate bilateral nephrolithiasis. No hydronephrosis. Urinary bladder is unremarkable. Stomach/Bowel: Postsurgical changes compatible with colectomy and right lower quadrant ileostomy. No acute process. Vascular/Lymphatic: Normal caliber abdominal aorta. No retroperitoneal lymphadenopathy. Reproductive: Unremarkable Other: Similar-appearing 3.4 x 1.6 cm low-attenuation region within the presacral space most  compatible with postoperative changes. New small amount of free fluid in the lower abdomen. Musculoskeletal: No aggressive or acute appearing osseous lesions. IMPRESSION: 1. Interval development of nodular areas of consolidation within the right lower lung which are nonspecific and may be infectious/inflammatory in etiology. Recommend dedicated chest CT for evaluation as there is a large area of ground-glass opacity within the right upper lobe which is incompletely visualized. Additional considerations would be pulmonary infarct or metastasis. 2. Postsurgical changes compatible with colectomy and right lower quadrant ileostomy. New nonspecific small amount of free fluid within the lower abdomen. 3. Nonobstructing right renal stone. 4. Similar-appearing low-attenuation region within the presacral space most compatible with postoperative changes. Electronically Signed   By: Lovey Newcomer M.D.   On: 07/11/2020 10:44   DG Chest 2 View  Result Date: 07/18/2020 CLINICAL DATA:  Suspected sepsis EXAM: CHEST - 2 VIEW COMPARISON:  July 11, 2020 FINDINGS: Persistent mild infiltrate in the lateral right lung base. The heart, hila, mediastinum, and pleura are normal. No pneumothorax. Stable right Port-A-Cath. No other lung abnormalities noted. IMPRESSION: Persistent mild infiltrate in the lateral right lung base. No other change. Electronically Signed   By: Dorise Bullion III M.D   On: 07/18/2020 13:10   DG Chest 2 View  Result Date: 07/11/2020 CLINICAL DATA:  Chest pain EXAM: CHEST - 2 VIEW COMPARISON:  12/16/2019 FINDINGS: Cardiac shadows within normal limits. The lungs are well aerated bilaterally. Mild patchy infiltrate is noted the lateral right lung base. No sizable seen. Right chest wall port is noted. No bony abnormality seen. IMPRESSION: Mild right basilar infiltrate. Electronically Signed   By: Inez Catalina M.D.   On: 07/11/2020 10:47   CT Chest Wo Contrast  Result Date: 07/18/2020 CLINICAL DATA:  Recent  diagnosis of pneumonia status post antibiotic therapy, persistent cough and fever, chest pain, history of colon cancer EXAM: CT CHEST WITHOUT CONTRAST TECHNIQUE: Multidetector CT imaging of the chest was performed following the standard protocol without IV contrast. COMPARISON:  07/11/2020, 07/18/2020 FINDINGS: Cardiovascular: Unenhanced imaging of the heart and great vessels demonstrates no pericardial effusion. Normal caliber of the thoracic aorta. Right chest wall port via internal jugular approach tip at the atrial caval junction. Mediastinum/Nodes: No enlarged mediastinal or axillary lymph nodes. Thyroid gland, trachea, and esophagus demonstrate no significant findings. Lungs/Pleura: The nodular areas of airspace disease seen previously at the lung bases are again identified. An area of nodular consolidation within the right lower lobe image 85 now measures 1.3 cm, previously having measured 2.0 cm. A 1.5 cm area of nodular consolidation in the lateral basilar segment of the right lower lobe image 90 measures 1.5 cm unchanged. A 0.5 cm nodule within the right middle lobe image 76 is unchanged. Nodular consolidation within the right upper lobe abutting the major fissure image 60 measures 1.9 cm and was not fully evaluated on the previous study. No new areas of consolidation, effusion, or pneumothorax. The central airways are patent. Upper Abdomen: Limited imaging through the upper abdomen demonstrates no acute findings.  Musculoskeletal: There are no acute or destructive bony lesions. Reconstructed images demonstrate no additional findings. IMPRESSION: 1. Persistent nodular areas of consolidation at the right lung base as above. One of the areas demonstrates interval decrease in size, while the others are stable. Inflammatory/infectious etiology is favored, though follow-up imaging in 6-8 weeks after completion of antibiotic therapy is recommended to document complete resolution and exclude underlying neoplasm.  Electronically Signed   By: Randa Ngo M.D.   On: 07/18/2020 22:04   DG ABD ACUTE 2+V W 1V CHEST  Result Date: 07/06/2020 CLINICAL DATA:  Abdominal pain beginning yesterday. Nausea. Colon carcinoma. EXAM: DG ABDOMEN ACUTE W/ 1V CHEST COMPARISON:  02/09/2020 FINDINGS: There is no evidence of dilated bowel loops or free intraperitoneal air. No radiopaque calculi or other significant radiographic abnormality is seen. An ostomy ring is seen in the right lower quadrant. Heart size and mediastinal contours are within normal limits. Power port is seen in appropriate position. Both lungs are clear. IMPRESSION: Unremarkable bowel gas pattern.  No acute findings. No active cardiopulmonary disease. Electronically Signed   By: Marlaine Hind M.D.   On: 07/06/2020 08:16      LAB RESULTS: Basic Metabolic Panel: Recent Labs  Lab 07/20/20 0238 07/21/20 0203  NA 130* 137  K 3.0* 4.3  CL 100 113*  CO2 19* 16*  GLUCOSE 101* 98  BUN 22* 19  CREATININE 1.96* 1.79*  CALCIUM 8.4* 9.4  MG 1.8  --   PHOS 4.0  --    Liver Function Tests: Recent Labs  Lab 07/18/20 1236 07/19/20 0718  AST 27 21  ALT 29 24  ALKPHOS 97 91  BILITOT 0.3 0.7  PROT 7.7 6.9  ALBUMIN 3.3* 2.6*   No results for input(s): LIPASE, AMYLASE in the last 168 hours. No results for input(s): AMMONIA in the last 168 hours. CBC: Recent Labs  Lab 07/19/20 0718 07/19/20 0718 07/20/20 0238 07/20/20 0238 07/21/20 0203  WBC 18.2*   < > 9.0  --  8.5  NEUTROABS 14.4*  --   --   --   --   HGB 8.5*   < > 8.0*  --  8.3*  HCT 26.1*   < > 25.4*  --  25.6*  MCV 89.4   < > 90.7   < > 89.2  PLT 277   < > 275  --  289   < > = values in this interval not displayed.   Cardiac Enzymes: Recent Labs  Lab 07/18/20 2227  CKTOTAL 160   BNP: Invalid input(s): POCBNP CBG: No results for input(s): GLUCAP in the last 168 hours.     Disposition and Follow-up: Discharge Instructions    Call MD for:  difficulty breathing, headache or  visual disturbances   Complete by: As directed    Call MD for:  persistant nausea and vomiting   Complete by: As directed    Call MD for:  temperature >100.4   Complete by: As directed    Diet - low sodium heart healthy   Complete by: As directed    Increase activity slowly   Complete by: As directed        DISPOSITION: Home   DISCHARGE FOLLOW-UP  Follow-up Information    Doug Sou B, DO. Schedule an appointment as soon as possible for a visit in 2 week(s).   Specialty: Internal Medicine Why: for hospital follow-up Contact information: [address] [place] [employer] [place] [address] [phone]  Time coordinating discharge:  47mins  Signed:   Estill Cotta M.D. [facility] 07/21/2020, 2:30 PM

## 2020-07-21 NOTE — Evaluation (Signed)
Physical Therapy Evaluation Patient Details Name: Cindy [name] MRN: [id number] DOB: [birth year] Today's Date: 07/21/2020   History of Present Illness  Cindy [name] is a 29 y.o. female with medical history significant of Familial adenomatous polyposis, colon cancer,  s/p resection 2016, Short bowel syndrome, Ileosotmy, CKD, s/p port placement, hypomagnesemia, Nephrolethiasis, bilateral ureteral stents, and anemia. Patient presented to ED with fever, chills, cough. Patient admitted for severe sepsis secondary to pneumonia and started on IV antibiotics.     Clinical Impression  Cindy [name] ifemales 29 y.o. female admitted with above HPI and diagnosis. Patient is currently limited by functional impairments below (see PT problem list). Patient lives alone and is independent at baseline. Patient evaluated by Physical Therapy with no further acute PT needs identified. All education has been completed and the patient has no further questions. Patient educated to continue mobilizing with RN staff/independently during acute stay. PT is signing off. Thank you for this referral.     Follow Up Recommendations No PT follow up    Equipment Recommendations  None recommended by PT    Recommendations for Other Services       Precautions / Restrictions Precautions Precautions: None Restrictions Weight Bearing Restrictions: No      Mobility  Bed Mobility Overal bed mobility: Independent                Transfers Overall transfer level: Independent Equipment used: None                Ambulation/Gait Ambulation/Gait assistance: Independent Gait Distance (Feet): 400 Feet Assistive device: None Gait Pattern/deviations: WFL(Within Functional Limits) Gait velocity: WNLs   General Gait Details: slightly slow gait, this is pt's comfortable pace  Stairs            Wheelchair Mobility    Modified Rankin (Stroke Patients Only)       Balance Overall balance  assessment: No apparent balance deficits (not formally assessed)   Sitting balance-Leahy Scale: Normal       Standing balance-Leahy Scale: Normal   Single Leg Stance - Right Leg: 15 Single Leg Stance - Left Leg: 20             Standardized Balance Assessment Standardized Balance Assessment : Dynamic Gait Index   Dynamic Gait Index Level Surface: Normal Change in Gait Speed: Normal Gait with Horizontal Head Turns: Normal Gait with Vertical Head Turns: Normal       Pertinent Vitals/Pain Pain Assessment: No/denies pain    Home Living Family/patient expects to be discharged to:: Private residence Living Arrangements: Alone Available Help at Discharge: Family Type of Home: Apartment Home Access: Stairs to enter Entrance Stairs-Rails: Psychiatric nurse of Steps: 10 Home Layout: One level Home Equipment: None      Prior Function Level of Independence: Independent               Hand Dominance   Dominant Hand: Right    Extremity/Trunk Assessment   Upper Extremity Assessment Upper Extremity Assessment: Overall WFL for tasks assessed    Lower Extremity Assessment Lower Extremity Assessment: Overall WFL for tasks assessed    Cervical / Trunk Assessment Cervical / Trunk Assessment: Normal  Communication   Communication: No difficulties  Cognition Arousal/Alertness: Awake/alert Behavior During Therapy: WFL for tasks assessed/performed Overall Cognitive Status: Within Functional Limits for tasks assessed                 General Comments      Exercises  Assessment/Plan    PT Assessment Patent does not need any further PT services  PT Problem List         PT Treatment Interventions      PT Goals (Current goals can be found in the Care Plan section)  Acute Rehab PT Goals Patient Stated Goal: get home today PT Goal Formulation: All assessment and education complete, DC therapy Time For Goal Achievement:  07/24/20 Potential to Achieve Goals: Good    Frequency  1x eval    AM-PAC PT "6 Clicks" Mobility  Outcome Measure Help needed turning from your back to your side while in a flat bed without using bedrails?: None Help needed moving from lying on your back to sitting on the side of a flat bed without using bedrails?: None Help needed moving to and from a bed to a chair (including a wheelchair)?: None Help needed standing up from a chair using your arms (e.g., wheelchair or bedside chair)?: None Help needed to walk in hospital room?: None Help needed climbing 3-5 steps with a railing? : None 6 Click Score: 24    End of Session   Activity Tolerance: Patient tolerated treatment well Patient left: in bed;with call bell/phone within reach Nurse Communication: Mobility status PT Visit Diagnosis: Other abnormalities of gait and mobility (R26.89);Muscle weakness (generalized) (M62.81)    Time: 1191-4782 PT Time Calculation (min) (ACUTE ONLY): 13 min   Charges:   PT Evaluation $PT Eval Moderate Complexity: 1 Mod          Verner Mould, DPT Acute Rehabilitation Services  Office [phone] Pager [phone]  07/21/2020 2:46 PM

## 2020-07-22 ENCOUNTER — Emergency Department (HOSPITAL_COMMUNITY)
Admission: EM | Admit: 2020-07-22 | Discharge: 2020-07-22 | Discharge status: Against Medical Advice | Payer: PRIVATE HEALTH INSURANCE

## 2020-07-22 ENCOUNTER — Other Ambulatory Visit: Payer: Self-pay

## 2020-07-22 NOTE — ED Notes (Signed)
Per emt first and screener, the patient left.

## 2020-07-23 LAB — CULTURE, BLOOD (ROUTINE X 2)
Culture: NO GROWTH
Special Requests: ADEQUATE

## 2020-07-26 LAB — CULTURE, BLOOD (ROUTINE X 2)
Culture: NO GROWTH
Culture: NO GROWTH
Special Requests: ADEQUATE
Special Requests: ADEQUATE

## 2020-08-15 ENCOUNTER — Emergency Department (HOSPITAL_BASED_OUTPATIENT_CLINIC_OR_DEPARTMENT_OTHER)
Admission: EM | Admit: 2020-08-15 | Discharge: 2020-08-16 | Disposition: A | Discharge status: Home / Self Care | Payer: PRIVATE HEALTH INSURANCE | Attending: Internal Medicine | Admitting: Internal Medicine

## 2020-08-15 ENCOUNTER — Other Ambulatory Visit: Payer: Self-pay

## 2020-08-15 ENCOUNTER — Encounter (HOSPITAL_BASED_OUTPATIENT_CLINIC_OR_DEPARTMENT_OTHER): Payer: Self-pay | Admitting: Emergency Medicine

## 2020-08-15 DIAGNOSIS — Z20822 Contact with and (suspected) exposure to covid-19: Secondary | ICD-10-CM | POA: Diagnosis not present

## 2020-08-15 DIAGNOSIS — R000 Tachycardia, unspecified: Secondary | ICD-10-CM | POA: Insufficient documentation

## 2020-08-15 DIAGNOSIS — Z85038 Personal history of other malignant neoplasm of large intestine: Secondary | ICD-10-CM | POA: Insufficient documentation

## 2020-08-15 DIAGNOSIS — R6520 Severe sepsis without septic shock: Secondary | ICD-10-CM | POA: Diagnosis present

## 2020-08-15 DIAGNOSIS — E876 Hypokalemia: Secondary | ICD-10-CM | POA: Diagnosis not present

## 2020-08-15 DIAGNOSIS — R109 Unspecified abdominal pain: Secondary | ICD-10-CM | POA: Diagnosis present

## 2020-08-15 DIAGNOSIS — N179 Acute kidney failure, unspecified: Secondary | ICD-10-CM | POA: Diagnosis not present

## 2020-08-15 DIAGNOSIS — N182 Chronic kidney disease, stage 2 (mild): Secondary | ICD-10-CM | POA: Diagnosis not present

## 2020-08-15 DIAGNOSIS — R42 Dizziness and giddiness: Secondary | ICD-10-CM | POA: Insufficient documentation

## 2020-08-15 DIAGNOSIS — N3000 Acute cystitis without hematuria: Secondary | ICD-10-CM | POA: Insufficient documentation

## 2020-08-15 DIAGNOSIS — A419 Sepsis, unspecified organism: Secondary | ICD-10-CM | POA: Diagnosis present

## 2020-08-15 MED ORDER — ONDANSETRON HCL 4 MG/2ML IJ SOLN
4.0000 mg | Freq: Once | INTRAMUSCULAR | Status: AC
  Administered 2020-08-15: 4 mg via INTRAVENOUS
  Filled 2020-08-15: qty 2

## 2020-08-15 MED ORDER — FENTANYL CITRATE (PF) 100 MCG/2ML IJ SOLN
50.0000 ug | Freq: Once | INTRAMUSCULAR | Status: AC
  Administered 2020-08-15: 50 ug via INTRAVENOUS
  Filled 2020-08-15: qty 2

## 2020-08-15 MED ORDER — SODIUM CHLORIDE 0.9 % IV BOLUS
1000.0000 mL | Freq: Once | INTRAVENOUS | Status: AC
  Administered 2020-08-15: 1000 mL via INTRAVENOUS

## 2020-08-15 NOTE — ED Triage Notes (Signed)
Reports abd pain, nausea, shaking, and feeling lightheaded since about 2015 today. States she had her first covid vaccine two days ago. Pt is connected to her home IVF through a port.

## 2020-08-15 NOTE — ED Provider Notes (Signed)
[facility]Y DEPARTMENT Provider Note   CSN: [id number] Arrival date & time: 08/15/20  2130     History Chief Complaint  Patient presents with  . Abdominal Pain    Cindy [name] is a 29 y.o. female.  HPI     This a 29 year old female with a history of colon cancer, chronic kidney disease, ileostomy who presents with abdominal pain.  Patient reports onset of mid abdominal pain earlier today.  She reports that it is bad mid abdomen and nonradiating.  It is sharp in nature.  She rates it a 10 out of 10.  She also reports shaking and feeling lightheaded.  She has a port in gave herself IV fluids earlier today approximately 500 cc.  Patient has an ileostomy.  She reports normal output from ileostomy.  Denies nausea or vomiting.  She did receive her first Covid vaccine 2 days ago.  Denies any sick contacts or Covid exposures.  Denies shortness of breath or cough.  Denies urinary symptoms.  Recent admission in August for sepsis related to community-acquired pneumonia and Pseudomonas.  Chart reviewed.  Baseline blood pressures 90 systolic.  Past Medical History:  Diagnosis Date  . Bilateral flank pain    due to ureteral stents  . Chronic hypokalemia   . CKD (chronic kidney disease), stage II   . 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 Complication of anesthesia    post op aspiration pneumonia w/ lap. appy 12-23-2007 (emergent ruptured appendix)  . FAP (familial adenomatous polyposis) 09/30/2014  . Heart murmur   . History of acute renal failure    2015;  2016  . History of fetal demise, not currently pregnant    03-22-2013  stillborn at 75 wks  . History of kidney stones   . History of sepsis    admitted 12-03-2016 (post-op day 2 w/ bilateral ureteral stents and stone  manipluation) discharged 12-06-2016  for urosepsis  . Hypomagnesemia   . Ileostomy in place [facility])   . Iron deficiency anemia   . Nephrolithiasis    BILATERAL   . Normocytic anemia   . Renal cyst, left    PER CT 11-12-2016  . Urgency of urination     Patient Active Problem List   Diagnosis Date Noted  . Pneumonia 07/18/2020  . Hyponatremia 07/18/2020  . Prolonged QT interval 07/18/2020  . Hypomagnesemia 07/18/2020  . Hypophosphatemia 07/18/2020  . Lower abdominal pain 07/13/2018  . Elevated lipase 07/13/2018  . AKI (acute kidney injury) ([place]) 09/02/2017  . Dehydration 06/07/2017  . Sepsis, unspecified organism ([place]) 12/03/2016  . 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 Normocytic anemia 05/02/2015  . Hypotension   . High output ileostomy ([place]) 09/30/2014  . FAP (familial adenomatous polyposis) 09/30/2014  . Nephrolithiasis 09/30/2014  . Adenocarcinoma of colon ([place]) 05/26/2013  . Nausea with vomiting 05/21/2013  . Microcytic anemia 04/25/2013  . Hypokalemia 04/25/2013    Past Surgical History:  Procedure Laterality Date  . ABDOMINAL SURGERY    . COLONOSCOPY N/A 05/24/2013   Procedure: COLONOSCOPY;  Surgeon: Missy Sabins, MD;  Location: [place];  Service: Endoscopy;  Laterality: N/A;  . COMPLETION PROTECTOMY AND REVISION ILEOSTOMY/ LYSIS ADHESIONS  07-28-2014    [facility]  . CYSTOSCOPY W/ RETROGRADES Right 12/15/2016  Procedure: CYSTOSCOPY WITH RETROGRADE PYELOGRAM;  Surgeon: Alexis Frock, MD;  Location: [facility];  Service: Urology;  Laterality: Right;  . CYSTOSCOPY W/ URETERAL STENT PLACEMENT Bilateral 11/01/2016   Procedure: CYSTOSCOPY WITH BILATERAL RETROGRADE PYELOGRAM BILATERAL URETERAL STENT PLACEMENT;  Surgeon: Alexis Frock, MD;  Location: [facility];  Service: Urology;  Laterality: Bilateral;  . CYSTOSCOPY W/ URETERAL STENT PLACEMENT Right 12/15/2016   Procedure: CYSTOSCOPY WITH STENT REPLACEMENT;  Surgeon: Alexis Frock, MD;   Location: [facility];  Service: Urology;  Laterality: Right;  . CYSTOSCOPY W/ URETERAL STENT REMOVAL Bilateral 12/15/2016   Procedure: CYSTOSCOPY WITH STENT REMOVAL;  Surgeon: Alexis Frock, MD;  Location: [facility];  Service: Urology;  Laterality: Bilateral;  . CYSTOSCOPY WITH URETEROSCOPY Bilateral 12/01/2016   Procedure: FIRST STAGE CYSTOSCOPY WITH URETEROSCOPY,  STONE MANIPULATION and BASKETRY, STENT EXCHANGE;  Surgeon: Alexis Frock, MD;  Location: [facility];  Service: Urology;  Laterality: Bilateral;  . CYSTOSCOPY WITH URETEROSCOPY Right 12/15/2016   Procedure: SECOND STAGE -CYSTOSCOPY WITH URETEROSCOPY AND STONE EXTRACTION WITH BASKET;  Surgeon: Alexis Frock, MD;  Location: [facility];  Service: Urology;  Laterality: Right;  . HOLMIUM LASER APPLICATION Bilateral 0/0/9233   Procedure: HOLMIUM LASER APPLICATION;  Surgeon: Alexis Frock, MD;  Location[facility];  Service: Urology;  Laterality: Bilateral;  . HOLMIUM LASER APPLICATION Right 0/05/6225   Procedure: HOLMIUM LASER APPLICATION;  Surgeon: Alexis Frock, MD;  Location: [facility];  Service: Urology;  Laterality: Right;  . I & D EXTREMITY Left 11/14/2013   Procedure: IRRIGATION AND DEBRIDEMENT LEFT LONG FINGER;  Surgeon: Tennis Must, MD;  Location: [place];  Service: Orthopedics;  Laterality: Left;  . IM NAILING TIBIA Left 08/23/2010  . LAPAROSCOPIC APPENDECTOMY  12/23/2007  . LEG RECONSTRUCTION USING FASCIAL FLAP  ~ 2009  . SUBTOTAL COLECTOMY /  RESECTION ENBLOC DISTAL ILEUM/  ILEOSTOMY  07/ 2014  [facility]  . TRANSTHORACIC ECHOCARDIOGRAM  11/11/2011   ef 55-60%/  mild MR/  trivial PR and TR/  PASP 29-27mmHg/  trivial pericardial effusion identified     OB History    Gravida  1   Para  1   Term  0   Preterm  1   AB  0   Living        SAB  0   TAB  0   Ectopic  0   Multiple      Live Births               Family History  Adopted: Yes  Problem Relation Age of Onset  . ALS Mother   . Healthy Father   . Alcohol abuse Neg Hx   . Arthritis Neg Hx   . Asthma Neg Hx   . Birth defects Neg Hx   . Cancer Neg Hx   . COPD Neg Hx   . Depression Neg Hx   . Diabetes Neg Hx   . Drug abuse Neg Hx   . Early death Neg Hx   . Hearing loss Neg Hx   . Heart disease Neg Hx   . Hyperlipidemia Neg Hx   . Hypertension Neg Hx   . Kidney disease Neg Hx   . Learning disabilities Neg Hx   . Mental illness Neg Hx   . Mental retardation Neg Hx   . Miscarriages / Stillbirths Neg Hx   . Stroke Neg Hx   . Vision loss Neg Hx  Social History   Tobacco Use  . Smoking status: Never Smoker  . Smokeless tobacco: Never Used  Vaping Use  . Vaping Use: Never used  Substance Use Topics  . Alcohol use: No  . Drug use: No    Home Medications Prior to Admission medications   Medication Sig Start Date End Date Taking? Authorizing Provider  acetaminophen (TYLENOL) 325 MG tablet Take 650 mg by mouth every 6 (six) hours as needed for mild pain or headache.    [provider]  benzonatate (TESSALON) 200 MG capsule Take 1 capsule (200 mg total) by mouth 3 (three) times daily as needed for cough. 07/21/20   Rai, Ripudeep K, MD  cyanocobalamin (,VITAMIN B-12,) 1000 MCG/ML injection Inject 1,000 mcg into the muscle once a week.     [provider]  guaiFENesin-codeine 100-10 MG/5ML syrup Take 5 mLs by mouth 2 (two) times daily as needed for cough. 07/21/20   Rai, Vernelle Emerald, MD  medroxyPROGESTERone (DEPO-PROVERA) 150 MG/ML injection Inject 150 mg into the muscle every 3 (three) months.    [provider]  Multiple Vitamins-Minerals (MULTIVITAMIN GUMMIES ADULT) CHEW Chew 2 each by mouth daily.     [provider]  ondansetron (ZOFRAN-ODT) 4 MG disintegrating tablet Take 1 tablet (4 mg total) by mouth every 8 (eight) hours as needed for nausea or vomiting. 07/21/20   Rai,  Ripudeep K, MD  potassium chloride SA (KLOR-CON) 20 MEQ tablet Take 20 mEq by mouth 2 (two) times daily.    [provider]  saccharomyces boulardii (FLORASTOR) 250 MG capsule Take 1 capsule (250 mg total) by mouth 2 (two) times daily. Any generic probiotic is okay 07/21/20   Rai, Vernelle Emerald, MD  Vitamin D, Ergocalciferol, (DRISDOL) 50000 units CAPS capsule Take 50,000 Units by mouth every Monday.  05/22/16   [provider]    Allergies    Food, Iron, Venofer [ferric oxide], Soap, Morphine and related, Sulfa antibiotics, Sulfamethoxazole-trimethoprim, and Levaquin [levofloxacin]  Review of Systems   Review of Systems  Constitutional: Positive for chills and fever.  HENT: Negative for congestion.   Respiratory: Negative for shortness of breath.   Cardiovascular: Negative for chest pain.  Gastrointestinal: Positive for abdominal pain. Negative for diarrhea, nausea and vomiting.  Genitourinary: Negative for dysuria.  All other systems reviewed and are negative.   Physical Exam Updated Vital Signs BP (!) 90/52   Pulse (!) 108   Temp (!) 100.4 F (38 C) (Oral)   Resp 20   Ht 1.524 m (5')   Wt 43.5 kg   LMP 08/14/2020   SpO2 99%   BMI 18.75 kg/m   Physical Exam Vitals and nursing note reviewed.  Constitutional:      Appearance: She is well-developed. She is ill-appearing. She is not toxic-appearing.  HENT:     Head: Normocephalic and atraumatic.     Mouth/Throat:     Comments: Mucous membranes dry Eyes:     Pupils: Pupils are equal, round, and reactive to light.  Cardiovascular:     Rate and Rhythm: Regular rhythm. Tachycardia present.     Heart sounds: Normal heart sounds.  Pulmonary:     Effort: Pulmonary effort is normal. No respiratory distress.     Breath sounds: No wheezing.  Abdominal:     General: Bowel sounds are normal.     Palpations: Abdomen is soft.     Tenderness: There is abdominal tenderness.     Comments: Right-sided abdominal  tenderness to palpation  adjacent to ileostomy, no rebound or guarding noted  Musculoskeletal:     Cervical back: Neck supple.  Skin:    General: Skin is warm and dry.  Neurological:     Mental Status: She is alert and oriented to person, place, and time.  Psychiatric:        Mood and Affect: Mood normal.     ED Results / Procedures / Treatments   Labs (all labs ordered are listed, but only abnormal results are displayed) Labs Reviewed  CBC WITH DIFFERENTIAL/PLATELET - Abnormal; Notable for the following components:      Result Value   WBC 19.9 (*)    RBC 3.10 (*)    Hemoglobin 8.9 (*)    HCT 26.4 (*)    Neutro Abs 19.2 (*)    Lymphs Abs 0.4 (*)    Abs Immature Granulocytes 0.14 (*)    All other components within normal limits  COMPREHENSIVE METABOLIC PANEL - Abnormal; Notable for the following components:   Sodium 130 (*)    Potassium 2.5 (*)    Chloride 87 (*)    Glucose, Bld 128 (*)    BUN 32 (*)    Creatinine, Ser 2.69 (*)    Calcium 8.2 (*)    Albumin 3.4 (*)    GFR calc non Af Amer 23 (*)    GFR calc Af Amer 26 (*)    All other components within normal limits  URINALYSIS, ROUTINE W REFLEX MICROSCOPIC - Abnormal; Notable for the following components:   APPearance CLOUDY (*)    Hgb urine dipstick TRACE (*)    Ketones, ur 15 (*)    Leukocytes,Ua SMALL (*)    All other components within normal limits  URINALYSIS, MICROSCOPIC (REFLEX) - Abnormal; Notable for the following components:   Bacteria, UA MANY (*)    All other components within normal limits  SARS CORONAVIRUS 2 BY RT PCR (HOSPITAL ORDER, [facility])  CULTURE, BLOOD (ROUTINE X 2)  CULTURE, BLOOD (ROUTINE X 2)  LIPASE, BLOOD  PREGNANCY, URINE  HCG, QUANTITATIVE, PREGNANCY  LACTIC ACID, PLASMA  LACTIC ACID, PLASMA    EKG None  Radiology CT ABDOMEN PELVIS WO CONTRAST  Result Date: 08/16/2020 CLINICAL DATA:  Abdominal pain and fever EXAM: CT ABDOMEN AND PELVIS WITHOUT  CONTRAST TECHNIQUE: Multidetector CT imaging of the abdomen and pelvis was performed following the standard protocol without IV contrast. COMPARISON:  07/11/2020, 07/18/2020 FINDINGS: Lower chest: Lung bases demonstrate some mild nodularity although overall improved when compared with the prior exams. Hepatobiliary: No focal liver abnormality is seen. No gallstones, gallbladder wall thickening, or biliary dilatation. Pancreas: Unremarkable. No pancreatic ductal dilatation or surrounding inflammatory changes. Spleen: Normal in size without focal abnormality. Adrenals/Urinary Tract: Adrenal glands are within normal limits. Tiny nonobstructing lower pole stone on right is noted. Hypodensity is noted within the left kidney consistent with a cyst. Mild fullness of the extrarenal pelvis is noted on the right. Bladder is partially distended. Stomach/Bowel: Status post colectomy with right lower quadrant ileostomy. No obstructive changes are noted. The stomach is within normal limits. Vascular/Lymphatic: No significant vascular findings are present. No enlarged abdominal or pelvic lymph nodes. Reproductive: Uterus and bilateral adnexa are unremarkable. Other: No abdominal wall hernia or abnormality. No abdominopelvic ascites. Musculoskeletal: No acute or significant osseous findings. IMPRESSION: Persistent but improved parenchymal nodularity in the lung bases. Changes consistent with prior colectomy with right lower quadrant ostomy. Nonobstructing right renal stone. No acute abnormality noted. Electronically Signed  By: Inez Catalina M.D.   On: 08/16/2020 02:51   DG Chest Portable 1 View  Result Date: 08/16/2020 CLINICAL DATA:  Fever EXAM: PORTABLE CHEST 1 VIEW COMPARISON:  07/18/2020 FINDINGS: The heart size and mediastinal contours are within normal limits. Both lungs are clear. The visualized skeletal structures are unremarkable. Right chest wall Port-A-Cath tip is at the cavoatrial junction. IMPRESSION: No active  disease. Electronically Signed   By: Ulyses Jarred M.D.   On: 08/16/2020 01:05    Procedures .Critical Care Performed by: Merryl Hacker, MD Authorized by: Merryl Hacker, MD   Critical care provider statement:    Critical care time (minutes):  45   Critical care was time spent personally by me on the following activities:  Discussions with consultants, evaluation of patient's response to treatment, examination of patient, ordering and performing treatments and interventions, ordering and review of laboratory studies, ordering and review of radiographic studies, pulse oximetry, re-evaluation of patient's condition, obtaining history from patient or surrogate and review of old charts   (including critical care time)  Medications Ordered in ED Medications  potassium chloride 10 mEq in 100 mL IVPB (10 mEq Intravenous New Bag/Given 08/16/20 0219)  metroNIDAZOLE (FLAGYL) IVPB 500 mg (has no administration in time range)  iohexol (OMNIPAQUE) 9 MG/ML oral solution 500 mL (has no administration in time range)  0.9 %  sodium chloride infusion (has no administration in time range)  sodium chloride 0.9 % bolus 1,000 mL (0 mLs Intravenous Stopped 08/16/20 0115)  fentaNYL (SUBLIMAZE) injection 50 mcg (50 mcg Intravenous Given 08/15/20 2357)  ondansetron (ZOFRAN) injection 4 mg (4 mg Intravenous Given 08/15/20 2356)  sodium chloride 0.9 % bolus 1,000 mL (0 mLs Intravenous Stopped 08/16/20 0217)  magnesium sulfate IVPB 2 g 50 mL (0 g Intravenous Stopped 08/16/20 0216)  fentaNYL (SUBLIMAZE) injection 100 mcg (100 mcg Intravenous Given 08/16/20 0109)  cefTRIAXone (ROCEPHIN) 1 g in sodium chloride 0.9 % 100 mL IVPB (1 g Intravenous New Bag/Given 08/16/20 0222)  promethazine (PHENERGAN) injection 25 mg (25 mg Intravenous Given 08/16/20 0257)    ED Course  I have reviewed the triage vital signs and the nursing notes.  Pertinent labs & imaging results that were available during my care of the patient were  reviewed by me and considered in my medical decision making (see chart for details).    MDM Rules/Calculators/A&P                           Patient presents with abdominal pain.  Reports chills at home as well.  She did have a Covid vaccine 2 days ago.  She gave herself 500 cc of fluid prior to evaluation.  She is chronically ill-appearing and thin.  Initially did noted to have blood pressures in the 80s.  Temperature 100.4.  She is tachycardic.  She appears dehydrated.  However, given temperature, with sepsis work-up was initiated.  She seems to have a lot of loose watery stool in her ostomy bag.  Question high output.  Given abdominal pain, patient could not have intra-abdominal infection or urinary tract infection which she has a history of.  She also had a recent history of pneumonia which is a consideration although she has no pulmonary symptoms at this time.  Work-up initiated.  Lactate normal.  White count 19.  For this reason, she was covered with Rocephin and Flagyl for intra-abdominal process.  She was given 2 L of fluid.  30 cc/kg would be approximately 1250 of fluids.  There are abnormalities noted hypokalemia with potassium of 2.5.  Patient was given 2 rounds of potassium as well as magnesium.  Additionally, noted to have an AKI with a creatinine at 2.69 which is elevated from discharge.  Urinalysis does show possible infection with 11-20 white cells and bacteria.  Urine culture was sent.  Blood cultures are pending.  Question whether she is truly septic versus significantly dehydrated with a possible UTI.  Regardless, she has received appropriate resuscitation and lactate is normal.  Blood pressure remains 47-65 systolic although this is close to her baseline and she is mentating well.  We will continue maintenance fluids.  CT abdomen obtained and shows no acute intra-abdominal process.  We will plan for admission given significant metabolic derangements, dehydration, signs for  sepsis.  Cindy [name] was evaluated in Emergency Department on 08/16/2020 for the symptoms described in the history of present illness. She was evaluated in the context of the global COVID-19 pandemic, which necessitated consideration that the patient might be at risk for infection with the SARS-CoV-2 virus that causes COVID-19. Institutional protocols and algorithms that pertain to the evaluation of patients at risk for COVID-19 are in a state of rapid change based on information released by regulatory bodies including the CDC and federal and state organizations. These policies and algorithms were followed during the patient's care in the ED.    Final Clinical Impression(s) / ED Diagnoses Final diagnoses:  AKI (acute kidney injury) ([place])  Hypokalemia  Acute cystitis without hematuria    Rx / DC Orders ED Discharge Orders    None       Merryl Hacker, MD 08/16/20 [phone]

## 2020-08-16 ENCOUNTER — Encounter (HOSPITAL_BASED_OUTPATIENT_CLINIC_OR_DEPARTMENT_OTHER): Payer: Self-pay

## 2020-08-16 ENCOUNTER — Emergency Department (HOSPITAL_BASED_OUTPATIENT_CLINIC_OR_DEPARTMENT_OTHER): Payer: PRIVATE HEALTH INSURANCE

## 2020-08-16 DIAGNOSIS — A419 Sepsis, unspecified organism: Secondary | ICD-10-CM | POA: Diagnosis present

## 2020-08-16 DIAGNOSIS — R6520 Severe sepsis without septic shock: Secondary | ICD-10-CM | POA: Diagnosis present

## 2020-08-16 LAB — CBC WITH DIFFERENTIAL/PLATELET
Abs Immature Granulocytes: 0.14 10*3/uL — ABNORMAL HIGH (ref 0.00–0.07)
Basophils Absolute: 0 10*3/uL (ref 0.0–0.1)
Basophils Relative: 0 %
Eosinophils Absolute: 0 10*3/uL (ref 0.0–0.5)
Eosinophils Relative: 0 %
HCT: 26.4 % — ABNORMAL LOW (ref 36.0–46.0)
Hemoglobin: 8.9 g/dL — ABNORMAL LOW (ref 12.0–15.0)
Immature Granulocytes: 1 %
Lymphocytes Relative: 2 %
Lymphs Abs: 0.4 10*3/uL — ABNORMAL LOW (ref 0.7–4.0)
MCH: 28.7 pg (ref 26.0–34.0)
MCHC: 33.7 g/dL (ref 30.0–36.0)
MCV: 85.2 fL (ref 80.0–100.0)
Monocytes Absolute: 0.2 10*3/uL (ref 0.1–1.0)
Monocytes Relative: 1 %
Neutro Abs: 19.2 10*3/uL — ABNORMAL HIGH (ref 1.7–7.7)
Neutrophils Relative %: 96 %
Platelets: 276 10*3/uL (ref 150–400)
RBC: 3.1 MIL/uL — ABNORMAL LOW (ref 3.87–5.11)
RDW: 13.4 % (ref 11.5–15.5)
WBC: 19.9 10*3/uL — ABNORMAL HIGH (ref 4.0–10.5)
nRBC: 0 % (ref 0.0–0.2)

## 2020-08-16 LAB — COMPREHENSIVE METABOLIC PANEL
ALT: 15 U/L (ref 0–44)
AST: 26 U/L (ref 15–41)
Albumin: 3.4 g/dL — ABNORMAL LOW (ref 3.5–5.0)
Alkaline Phosphatase: 109 U/L (ref 38–126)
Anion gap: 14 (ref 5–15)
BUN: 32 mg/dL — ABNORMAL HIGH (ref 6–20)
CO2: 29 mmol/L (ref 22–32)
Calcium: 8.2 mg/dL — ABNORMAL LOW (ref 8.9–10.3)
Chloride: 87 mmol/L — ABNORMAL LOW (ref 98–111)
Creatinine, Ser: 2.69 mg/dL — ABNORMAL HIGH (ref 0.44–1.00)
GFR calc Af Amer: 26 mL/min — ABNORMAL LOW (ref >60)
GFR calc non Af Amer: 23 mL/min — ABNORMAL LOW (ref >60)
Glucose, Bld: 128 mg/dL — ABNORMAL HIGH (ref 70–99)
Potassium: 2.5 mmol/L — CL (ref 3.5–5.1)
Sodium: 130 mmol/L — ABNORMAL LOW (ref 135–145)
Total Bilirubin: 0.7 mg/dL (ref 0.3–1.2)
Total Protein: 7.8 g/dL (ref 6.5–8.1)

## 2020-08-16 LAB — URINALYSIS, ROUTINE W REFLEX MICROSCOPIC
Bilirubin Urine: NEGATIVE
Glucose, UA: NEGATIVE mg/dL
Ketones, ur: 15 mg/dL — AB
Nitrite: NEGATIVE
Protein, ur: NEGATIVE mg/dL
Specific Gravity, Urine: 1.025 (ref 1.005–1.030)
pH: 5.5 (ref 5.0–8.0)

## 2020-08-16 LAB — BASIC METABOLIC PANEL
Anion gap: 9 (ref 5–15)
BUN: 25 mg/dL — ABNORMAL HIGH (ref 6–20)
CO2: 23 mmol/L (ref 22–32)
Calcium: 7.7 mg/dL — ABNORMAL LOW (ref 8.9–10.3)
Chloride: 102 mmol/L (ref 98–111)
Creatinine, Ser: 2.3 mg/dL — ABNORMAL HIGH (ref 0.44–1.00)
GFR calc Af Amer: 32 mL/min — ABNORMAL LOW (ref >60)
GFR calc non Af Amer: 27 mL/min — ABNORMAL LOW (ref >60)
Glucose, Bld: 102 mg/dL — ABNORMAL HIGH (ref 70–99)
Potassium: 2.8 mmol/L — ABNORMAL LOW (ref 3.5–5.1)
Sodium: 134 mmol/L — ABNORMAL LOW (ref 135–145)

## 2020-08-16 LAB — SARS CORONAVIRUS 2 BY RT PCR (HOSPITAL ORDER, PERFORMED IN ~~LOC~~ HOSPITAL LAB): SARS Coronavirus 2: NEGATIVE

## 2020-08-16 LAB — URINALYSIS, MICROSCOPIC (REFLEX): Squamous Epithelial / HPF: >50 (ref 0–5)

## 2020-08-16 LAB — LACTIC ACID, PLASMA
Lactic Acid, Venous: 1.3 mmol/L (ref 0.5–1.9)
Lactic Acid, Venous: 1.4 mmol/L (ref 0.5–1.9)

## 2020-08-16 LAB — PREGNANCY, URINE: Preg Test, Ur: NEGATIVE

## 2020-08-16 LAB — HCG, QUANTITATIVE, PREGNANCY: hCG, Beta Chain, Quant, S: 1 m[IU]/mL (ref <5)

## 2020-08-16 LAB — LIPASE, BLOOD: Lipase: 45 U/L (ref 11–51)

## 2020-08-16 MED ORDER — METRONIDAZOLE IN NACL 5-0.79 MG/ML-% IV SOLN
500.0000 mg | Freq: Once | INTRAVENOUS | Status: AC
  Administered 2020-08-16: 500 mg via INTRAVENOUS
  Filled 2020-08-16: qty 100

## 2020-08-16 MED ORDER — ACETAMINOPHEN 160 MG/5ML PO SOLN
ORAL | Status: AC
  Administered 2020-08-16: 1000 mg
  Filled 2020-08-16: qty 40.6

## 2020-08-16 MED ORDER — SODIUM CHLORIDE 0.9 % IV SOLN: Freq: Once | INTRAVENOUS | Status: AC

## 2020-08-16 MED ORDER — FENTANYL CITRATE (PF) 100 MCG/2ML IJ SOLN: 100.0000 ug | Freq: Once | INTRAMUSCULAR | Status: AC

## 2020-08-16 MED ORDER — PROMETHAZINE HCL 25 MG/ML IJ SOLN: 25.0000 mg | Freq: Once | INTRAMUSCULAR | Status: AC

## 2020-08-16 MED ORDER — FENTANYL CITRATE (PF) 100 MCG/2ML IJ SOLN
INTRAMUSCULAR | Status: AC
  Administered 2020-08-16: 100 ug via INTRAVENOUS
  Filled 2020-08-16: qty 2

## 2020-08-16 MED ORDER — POTASSIUM CHLORIDE 10 MEQ/100ML IV SOLN
10.0000 meq | INTRAVENOUS | Status: DC
  Administered 2020-08-16 (×2): 10 meq via INTRAVENOUS
  Filled 2020-08-16 (×2): qty 100

## 2020-08-16 MED ORDER — POTASSIUM CHLORIDE CRYS ER 20 MEQ PO TBCR: 20.0000 meq | EXTENDED_RELEASE_TABLET | Freq: Two times a day (BID) | ORAL | 0 refills | Status: DC

## 2020-08-16 MED ORDER — POTASSIUM CHLORIDE 10 MEQ/100ML IV SOLN
10.0000 meq | INTRAVENOUS | Status: AC
  Administered 2020-08-16 (×2): 10 meq via INTRAVENOUS
  Filled 2020-08-16 (×2): qty 100

## 2020-08-16 MED ORDER — CEPHALEXIN 500 MG PO CAPS: 500.0000 mg | ORAL_CAPSULE | Freq: Three times a day (TID) | ORAL | 0 refills | Status: AC

## 2020-08-16 MED ORDER — HEPARIN SOD (PORK) LOCK FLUSH 100 UNIT/ML IV SOLN
INTRAVENOUS | Status: AC
  Administered 2020-08-16: 500 [IU]
  Filled 2020-08-16: qty 5

## 2020-08-16 MED ORDER — MAGNESIUM SULFATE 2 GM/50ML IV SOLN
2.0000 g | Freq: Once | INTRAVENOUS | Status: AC
  Administered 2020-08-16: 2 g via INTRAVENOUS
  Filled 2020-08-16: qty 50

## 2020-08-16 MED ORDER — PROMETHAZINE HCL 25 MG/ML IJ SOLN
INTRAMUSCULAR | Status: AC
  Administered 2020-08-16: 25 mg via INTRAVENOUS
  Filled 2020-08-16: qty 1

## 2020-08-16 MED ORDER — IOHEXOL 9 MG/ML PO SOLN: 500.0000 mL | ORAL | Status: AC

## 2020-08-16 MED ORDER — FENTANYL CITRATE (PF) 100 MCG/2ML IJ SOLN
100.0000 ug | INTRAMUSCULAR | Status: AC | PRN
  Administered 2020-08-16: 100 ug via INTRAVENOUS
  Filled 2020-08-16: qty 2

## 2020-08-16 MED ORDER — SODIUM CHLORIDE 0.9 % IV SOLN
1.0000 g | Freq: Once | INTRAVENOUS | Status: AC
  Administered 2020-08-16: 1 g via INTRAVENOUS
  Filled 2020-08-16: qty 10

## 2020-08-16 MED ORDER — SODIUM CHLORIDE 0.9 % IV BOLUS
1000.0000 mL | Freq: Once | INTRAVENOUS | Status: AC
  Administered 2020-08-16: 1000 mL via INTRAVENOUS

## 2020-08-16 MED ORDER — POTASSIUM CHLORIDE CRYS ER 20 MEQ PO TBCR
40.0000 meq | EXTENDED_RELEASE_TABLET | Freq: Once | ORAL | Status: AC
  Administered 2020-08-16: 40 meq via ORAL
  Filled 2020-08-16: qty 2

## 2020-08-16 NOTE — ED Notes (Signed)
Per Dr. Billy Fischer, the rest of the K+ infusion is not needed to be given.  Pt can leave at this time.

## 2020-08-16 NOTE — ED Provider Notes (Signed)
  Physical Exam  BP (!) 90/53 (BP Location: Left Arm)   Pulse 89   Temp 98.2 F (36.8 C) (Oral)   Resp 18   Ht 5' (1.524 m)   Wt 43.5 kg   LMP 08/14/2020   SpO2 100%   BMI 18.75 kg/m   Physical Exam  ED Course/Procedures     Procedures  MDM  Pt awaiting transfer/admission at time of transfer of care. See Dr. Eliezer Bottom note for prior history, physical and care. Briefly, 29yo female who presented with abdominal pain, fever. Initially tachycardic.  Noted to have AKI and hypokalemia.  Possible UTI vs dehydration.   Patient requesting discharge. Reevaluated and discussed continued recommendation for admission given labwork, however reasonable to recheck labs and reevaluate. She is clear that her baseline blood pressures are 80s-90s and noted to have BP in 80s on prior ED visits with discharge in chart.  Lactate normal. No longer with tachycardia.  She feels back to normal.  Labs rechecked and K and Cr improving. Given additional K replacement IV and po.  Given clinical improvement at this time feel discharge is not unreasonable with close outpatient follow up and this is her preference.  Given rx for K, keflex. Discussed reasons to return and importance of repeat labs. Is noted to have chronic hyponatremia on labs from [facility], Cr has been increasing and decreasing in the past.       Gareth Morgan, MD 08/16/20 2218

## 2020-08-16 NOTE — ED Notes (Signed)
Vomiting after return from CT. EDP notified, see new orders. Pt reports pain is returning

## 2020-08-16 NOTE — ED Notes (Signed)
Dr Dina Rich aware of potassium level of 2.5

## 2020-08-17 ENCOUNTER — Telehealth (HOSPITAL_BASED_OUTPATIENT_CLINIC_OR_DEPARTMENT_OTHER): Payer: Self-pay

## 2020-08-17 ENCOUNTER — Telehealth (HOSPITAL_BASED_OUTPATIENT_CLINIC_OR_DEPARTMENT_OTHER): Payer: Self-pay | Admitting: Emergency Medicine

## 2020-08-17 LAB — BLOOD CULTURE ID PANEL (REFLEXED) - BCID2
A.calcoaceticus-baumannii: NOT DETECTED
A.calcoaceticus-baumannii: NOT DETECTED
Bacteroides fragilis: NOT DETECTED
Bacteroides fragilis: NOT DETECTED
CTX-M ESBL: NOT DETECTED
Candida albicans: NOT DETECTED
Candida albicans: NOT DETECTED
Candida auris: NOT DETECTED
Candida auris: NOT DETECTED
Candida glabrata: NOT DETECTED
Candida glabrata: NOT DETECTED
Candida krusei: NOT DETECTED
Candida krusei: NOT DETECTED
Candida parapsilosis: NOT DETECTED
Candida parapsilosis: NOT DETECTED
Candida tropicalis: NOT DETECTED
Candida tropicalis: NOT DETECTED
Carbapenem resist OXA 48 LIKE: NOT DETECTED
Carbapenem resistance IMP: NOT DETECTED
Carbapenem resistance KPC: NOT DETECTED
Carbapenem resistance NDM: NOT DETECTED
Carbapenem resistance VIM: NOT DETECTED
Cryptococcus neoformans/gattii: NOT DETECTED
Cryptococcus neoformans/gattii: NOT DETECTED
Enterobacter cloacae complex: NOT DETECTED
Enterobacter cloacae complex: NOT DETECTED
Enterobacterales: DETECTED — AB
Enterobacterales: NOT DETECTED
Enterococcus Faecium: NOT DETECTED
Enterococcus Faecium: NOT DETECTED
Enterococcus faecalis: NOT DETECTED
Enterococcus faecalis: NOT DETECTED
Escherichia coli: NOT DETECTED
Escherichia coli: NOT DETECTED
Haemophilus influenzae: NOT DETECTED
Haemophilus influenzae: NOT DETECTED
Klebsiella aerogenes: NOT DETECTED
Klebsiella aerogenes: NOT DETECTED
Klebsiella oxytoca: NOT DETECTED
Klebsiella oxytoca: NOT DETECTED
Klebsiella pneumoniae: DETECTED — AB
Klebsiella pneumoniae: NOT DETECTED
Listeria monocytogenes: NOT DETECTED
Listeria monocytogenes: NOT DETECTED
Methicillin resistance mecA/C: DETECTED — AB
Neisseria meningitidis: NOT DETECTED
Neisseria meningitidis: NOT DETECTED
Proteus species: NOT DETECTED
Proteus species: NOT DETECTED
Pseudomonas aeruginosa: NOT DETECTED
Pseudomonas aeruginosa: NOT DETECTED
Salmonella species: NOT DETECTED
Salmonella species: NOT DETECTED
Serratia marcescens: NOT DETECTED
Serratia marcescens: NOT DETECTED
Staphylococcus aureus (BCID): NOT DETECTED
Staphylococcus aureus (BCID): NOT DETECTED
Staphylococcus epidermidis: DETECTED — AB
Staphylococcus epidermidis: NOT DETECTED
Staphylococcus lugdunensis: NOT DETECTED
Staphylococcus lugdunensis: NOT DETECTED
Staphylococcus species: DETECTED — AB
Staphylococcus species: NOT DETECTED
Stenotrophomonas maltophilia: NOT DETECTED
Stenotrophomonas maltophilia: NOT DETECTED
Streptococcus agalactiae: NOT DETECTED
Streptococcus agalactiae: NOT DETECTED
Streptococcus pneumoniae: NOT DETECTED
Streptococcus pneumoniae: NOT DETECTED
Streptococcus pyogenes: NOT DETECTED
Streptococcus pyogenes: NOT DETECTED
Streptococcus species: NOT DETECTED
Streptococcus species: NOT DETECTED

## 2020-08-17 NOTE — Telephone Encounter (Signed)
This RN called pt to tell her about positive BC, was advised by previous RN and MD to ensure that the patient did pick up her antibiotic and is not feeling any worse. Pt reports she did pick up her antibiotic and will return if she is not feeling better.

## 2020-08-17 NOTE — Telephone Encounter (Signed)
Lab called with positive blood culture. Dr. Florina Ou consulted. Pt treated appropriately. Day shift to call and follow up with pt. Pt should return to ED if not improving.

## 2020-08-18 LAB — CULTURE, BLOOD (ROUTINE X 2): Special Requests: ADEQUATE

## 2020-08-21 LAB — CULTURE, BLOOD (ROUTINE X 2): Special Requests: ADEQUATE

## 2020-08-22 ENCOUNTER — Telehealth (HOSPITAL_BASED_OUTPATIENT_CLINIC_OR_DEPARTMENT_OTHER): Payer: Self-pay | Admitting: Emergency Medicine

## 2020-08-22 NOTE — Telephone Encounter (Signed)
Post ED Visit - Positive Culture Follow-up: Unsuccessful Patient Follow-up  Culture assessed and recommendations reviewed by:  []  Elenor Quinones, Pharm.D. []  Heide Guile, Pharm.D., BCPS AQ-ID []  Parks Neptune, Pharm.D., BCPS []  Alycia Rossetti, Pharm.D., BCPS []  [place], Pharm.D., BCPS, AAHIVP []  Legrand Como, Pharm.D., BCPS, AAHIVP [x]  Duanne Limerick, PharmD []  Vincenza Hews, PharmD, BCPS  Positive blood culture  []  Patient discharged without antimicrobial prescription and treatment is now indicated []  Organism is resistant to prescribed ED discharge antimicrobial [x]  Patient with positive blood cultures   Unable to contact patient after 3 attempts, letter will be sent to address on file Plan: Return to ED  Cindy [name] 08/22/2020, 5:45 PM

## 2020-10-01 ENCOUNTER — Emergency Department (HOSPITAL_COMMUNITY)
Admission: EM | Admit: 2020-10-01 | Discharge: 2020-10-01 | Disposition: A | Discharge status: Home / Self Care | Payer: PRIVATE HEALTH INSURANCE | Attending: Emergency Medicine | Admitting: Emergency Medicine

## 2020-10-01 ENCOUNTER — Other Ambulatory Visit: Payer: Self-pay

## 2020-10-01 ENCOUNTER — Encounter (HOSPITAL_COMMUNITY): Payer: Self-pay

## 2020-10-01 DIAGNOSIS — H9201 Otalgia, right ear: Secondary | ICD-10-CM | POA: Insufficient documentation

## 2020-10-01 DIAGNOSIS — N182 Chronic kidney disease, stage 2 (mild): Secondary | ICD-10-CM | POA: Diagnosis not present

## 2020-10-01 DIAGNOSIS — K047 Periapical abscess without sinus: Secondary | ICD-10-CM | POA: Insufficient documentation

## 2020-10-01 DIAGNOSIS — K0889 Other specified disorders of teeth and supporting structures: Secondary | ICD-10-CM | POA: Diagnosis present

## 2020-10-01 MED ORDER — OXYCODONE-ACETAMINOPHEN 5-325 MG PO TABS
1.0000 | ORAL_TABLET | Freq: Once | ORAL | Status: AC
  Administered 2020-10-01: 1 via ORAL
  Filled 2020-10-01: qty 1

## 2020-10-01 MED ORDER — AMOXICILLIN-POT CLAVULANATE 500-125 MG PO TABS: 1.0000 | ORAL_TABLET | Freq: Two times a day (BID) | ORAL | 0 refills | Status: AC

## 2020-10-01 MED ORDER — HYDROCODONE-ACETAMINOPHEN 5-325 MG PO TABS: 1.0000 | ORAL_TABLET | Freq: Four times a day (QID) | ORAL | 0 refills | Status: DC | PRN

## 2020-10-01 NOTE — ED Provider Notes (Signed)
[facility] DEPT Provider Note   CSN: [id number] Arrival date & time: 10/01/20  9323     History Chief Complaint  Patient presents with  . Dental Pain    Cindy [name] is a 29 y.o. female with past medical history of CKD, colon cancer, ileostomy in place presents emergency department today for dental pain.  Patient states that dental pain started last night on the right side.  States that it was severe and she couldnt sleep through it.  States that she took some leftover Norco and this did not really help with the pain that much, however it did take the edge off.  Denies any drooling, sore throat, trismus, fevers, nausea, vomiting.  Does admit to dental pain radiating up into her right ear.  No tinnitus, otorrhea.  No trauma to that area.  Has never had a dental infection, however states that she does have multiple dental caries.  Denies any trouble swallowing, trouble breathing.  States that she does have a Pharmacist, community, however they are closed today.  Denies any trauma in her mouth.  HPI     Past Medical History:  Diagnosis Date  . Bilateral flank pain    due to ureteral stents  . Chronic hypokalemia   . CKD (chronic kidney disease), stage II   . 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 Complication of anesthesia    post op aspiration pneumonia w/ lap. appy 12-23-2007 (emergent ruptured appendix)  . FAP (familial adenomatous polyposis) 09/30/2014  . Heart murmur   . History of acute renal failure    2015;  2016  . History of fetal demise, not currently pregnant    03-22-2013  stillborn at 88 wks  . History of kidney stones   . History of sepsis    admitted 12-03-2016 (post-op day 2 w/ bilateral ureteral stents and stone manipluation) discharged 12-06-2016  for  urosepsis  . Hypomagnesemia   . Ileostomy in place [facility])   . Iron deficiency anemia   . Nephrolithiasis    BILATERAL   . Normocytic anemia   . Renal cyst, left    PER CT 11-12-2016  . Urgency of urination     Patient Active Problem List   Diagnosis Date Noted  . Severe sepsis ([place]) 08/16/2020  . Pneumonia 07/18/2020  . Hyponatremia 07/18/2020  . Prolonged QT interval 07/18/2020  . Hypomagnesemia 07/18/2020  . Hypophosphatemia 07/18/2020  . Lower abdominal pain 07/13/2018  . Elevated lipase 07/13/2018  . AKI (acute kidney injury) ([place]) 09/02/2017  . Dehydration 06/07/2017  . Sepsis, unspecified organism ([place]) 12/03/2016  . 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 Normocytic anemia 05/02/2015  . Hypotension   . High output ileostomy ([place]) 09/30/2014  . FAP (familial adenomatous polyposis) 09/30/2014  . Nephrolithiasis 09/30/2014  . Adenocarcinoma of colon ([place]) 05/26/2013  . Nausea with vomiting 05/21/2013  . Microcytic anemia 04/25/2013  . Hypokalemia 04/25/2013    Past Surgical History:  Procedure Laterality Date  . ABDOMINAL SURGERY    . COLONOSCOPY N/A 05/24/2013   Procedure: COLONOSCOPY;  Surgeon: Missy Sabins, MD;  Location: [facility];  Service: Endoscopy;  Laterality: N/A;  . COMPLETION PROTECTOMY AND REVISION ILEOSTOMY/ LYSIS ADHESIONS  07-28-2014    [facility]  . CYSTOSCOPY W/ RETROGRADES Right 12/15/2016  Procedure: CYSTOSCOPY WITH RETROGRADE PYELOGRAM;  Surgeon: Alexis Frock, MD;  Location: [facility];  Service: Urology;  Laterality: Right;  . CYSTOSCOPY W/ URETERAL STENT PLACEMENT Bilateral 11/01/2016   Procedure: CYSTOSCOPY WITH BILATERAL RETROGRADE PYELOGRAM BILATERAL URETERAL STENT PLACEMENT;  Surgeon: Alexis Frock, MD;  Location: [facility];  Service: Urology;  Laterality: Bilateral;  . CYSTOSCOPY W/ URETERAL STENT PLACEMENT Right 12/15/2016   Procedure: CYSTOSCOPY WITH STENT REPLACEMENT;  Surgeon: Alexis Frock, MD;  Location:  [facility];  Service: Urology;  Laterality: Right;  . CYSTOSCOPY W/ URETERAL STENT REMOVAL Bilateral 12/15/2016   Procedure: CYSTOSCOPY WITH STENT REMOVAL;  Surgeon: Alexis Frock, MD;  Location: [facility];  Service: Urology;  Laterality: Bilateral;  . CYSTOSCOPY WITH URETEROSCOPY Bilateral 12/01/2016   Procedure: FIRST STAGE CYSTOSCOPY WITH URETEROSCOPY,  STONE MANIPULATION and BASKETRY, STENT EXCHANGE;  Surgeon: Alexis Frock, MD;  Location: [facility];  Service: Urology;  Laterality: Bilateral;  . CYSTOSCOPY WITH URETEROSCOPY Right 12/15/2016   Procedure: SECOND STAGE -CYSTOSCOPY WITH URETEROSCOPY AND STONE EXTRACTION WITH BASKET;  Surgeon: Alexis Frock, MD;  Location: [facility];  Service: Urology;  Laterality: Right;  . HOLMIUM LASER APPLICATION Bilateral 04/29/8465   Procedure: HOLMIUM LASER APPLICATION;  Surgeon: Alexis Frock, MD;  Location: [facility];  Service: Urology;  Laterality: Bilateral;  . HOLMIUM LASER APPLICATION Right 5/99/3570   Procedure: HOLMIUM LASER APPLICATION;  Surgeon: Alexis Frock, MD;  Location: [facility];  Service: Urology;  Laterality: Right;  . I & D EXTREMITY Left 11/14/2013   Procedure: IRRIGATION AND DEBRIDEMENT LEFT LONG FINGER;  Surgeon: Tennis Must, MD;  Location: [facility];  Service: Orthopedics;  Laterality: Left;  . IM NAILING TIBIA Left 08/23/2010  . LAPAROSCOPIC APPENDECTOMY  12/23/2007  . LEG RECONSTRUCTION USING FASCIAL FLAP  ~ 2009  . SUBTOTAL COLECTOMY /  RESECTION ENBLOC DISTAL ILEUM/  ILEOSTOMY  07/ 2014  [facility]  . TRANSTHORACIC ECHOCARDIOGRAM  11/11/2011   ef 55-60%/  mild MR/  trivial PR and TR/  PASP 29-37mmHg/  trivial pericardial effusion identified     OB History    Gravida  1   Para  1   Term  0   Preterm  1   AB  0   Living        SAB  0   TAB  0   Ectopic  0   Multiple      Live Births               Family History  Adopted: Yes  Problem Relation Age of Onset  . ALS Mother   . Healthy Father   . Alcohol abuse Neg Hx   . Arthritis Neg Hx   . Asthma Neg Hx   . Birth defects Neg Hx   . Cancer Neg Hx   . COPD Neg Hx   . Depression Neg Hx   . Diabetes Neg Hx   . Drug abuse Neg Hx   . Early death Neg Hx   . Hearing loss Neg Hx   . Heart disease Neg Hx   . Hyperlipidemia Neg Hx   . Hypertension Neg Hx   . Kidney disease Neg Hx   . Learning disabilities Neg Hx   . Mental illness Neg Hx   . Mental retardation Neg Hx   . Miscarriages / Stillbirths Neg Hx   . Stroke Neg Hx   . Vision loss Neg Hx  Social History   Tobacco Use  . Smoking status: Never Smoker  . Smokeless tobacco: Never Used  Vaping Use  . Vaping Use: Never used  Substance Use Topics  . Alcohol use: No  . Drug use: No    Home Medications Prior to Admission medications   Medication Sig Start Date End Date Taking? Authorizing Provider  acetaminophen (TYLENOL) 325 MG tablet Take 650 mg by mouth every 6 (six) hours as needed for mild pain or headache.    [provider]  amoxicillin-clavulanate (AUGMENTIN) 500-125 MG tablet Take 1 tablet (500 mg total) by mouth in the morning and at bedtime for 7 days. 10/01/20 10/08/20  Alfredia Client, PA-C  benzonatate (TESSALON) 200 MG capsule Take 1 capsule (200 mg total) by mouth 3 (three) times daily as needed for cough. 07/21/20   Rai, Ripudeep K, MD  cyanocobalamin (,VITAMIN B-12,) 1000 MCG/ML injection Inject 1,000 mcg into the muscle once a week.     [provider]  guaiFENesin-codeine 100-10 MG/5ML syrup Take 5 mLs by mouth 2 (two) times daily as needed for cough. 07/21/20   Rai, Vernelle Emerald, MD  HYDROcodone-acetaminophen (NORCO/VICODIN) 5-325 MG tablet Take 1 tablet by mouth every 6 (six) hours as needed for up to 3 doses for severe pain. 10/01/20   Alfredia Client, PA-C  medroxyPROGESTERone (DEPO-PROVERA) 150 MG/ML injection Inject 150 mg into the  muscle every 3 (three) months.    [provider]  Multiple Vitamins-Minerals (MULTIVITAMIN GUMMIES ADULT) CHEW Chew 2 each by mouth daily.     [provider]  ondansetron (ZOFRAN-ODT) 4 MG disintegrating tablet Take 1 tablet (4 mg total) by mouth every 8 (eight) hours as needed for nausea or vomiting. 07/21/20   Rai, Ripudeep K, MD  potassium chloride SA (KLOR-CON) 20 MEQ tablet Take 20 mEq by mouth 2 (two) times daily.    [provider]  potassium chloride SA (KLOR-CON) 20 MEQ tablet Take 1 tablet (20 mEq total) by mouth 2 (two) times daily for 4 days. Then return normal potassium intake per your prescription 08/16/20 08/20/20  Gareth Morgan, MD  saccharomyces boulardii (FLORASTOR) 250 MG capsule Take 1 capsule (250 mg total) by mouth 2 (two) times daily. Any generic probiotic is okay 07/21/20   Rai, Vernelle Emerald, MD  Vitamin D, Ergocalciferol, (DRISDOL) 50000 units CAPS capsule Take 50,000 Units by mouth every Monday.  05/22/16   [provider]    Allergies    Food, Iron, Venofer [ferric oxide], Soap, Morphine and related, Sulfa antibiotics, Sulfamethoxazole-trimethoprim, and Levaquin [levofloxacin]  Review of Systems   Review of Systems  Constitutional: Negative for diaphoresis, fatigue and fever.  HENT: Positive for dental problem and ear pain. Negative for facial swelling, sinus pain, sore throat and tinnitus.   Eyes: Negative for redness and visual disturbance.  Respiratory: Negative for shortness of breath.   Cardiovascular: Negative for chest pain.  Gastrointestinal: Negative for nausea and vomiting.  Musculoskeletal: Negative for back pain and myalgias.  Skin: Negative for color change, pallor, rash and wound.  Neurological: Negative for syncope, weakness, light-headedness, numbness and headaches.  Psychiatric/Behavioral: Negative for behavioral problems and confusion.    Physical Exam Updated Vital Signs BP 109/81 (BP Location: Left Arm)    Pulse 88   Temp 98 F (36.7 C) (Oral)   Resp 15   Ht 5' (1.524 m)   Wt 45.8 kg   LMP 09/30/2020 (Approximate)   SpO2 100%   BMI 19.73 kg/m   Physical Exam Constitutional:  General: She is not in acute distress.    Appearance: Normal appearance. She is not ill-appearing, toxic-appearing or diaphoretic.     Comments: Patient appears well, able to speak to me normally, able to speak to me, handling secretions well.  HENT:     Head: Normocephalic and atraumatic.      Comments: Pt with tenderness in area depicted above, no swelling or erythema. No trismus or drooling.    Mouth/Throat:     Dentition: Abnormal dentition. Does not have dentures. Dental tenderness and dental caries present. No gingival swelling or dental abscesses.     Tongue: No lesions. Tongue does not deviate from midline.     Palate: No mass and lesions.     Pharynx: Oropharynx is clear. Uvula midline. No pharyngeal swelling or oropharyngeal exudate.     Tonsils: No tonsillar exudate.      Comments: Tenderness in teeth as depicted above, tooth appears infected. No abscess palpated. No trismus. No swelling under tongue. Eyes:     Extraocular Movements: Extraocular movements intact.     Pupils: Pupils are equal, round, and reactive to light.  Cardiovascular:     Rate and Rhythm: Normal rate and regular rhythm.     Pulses: Normal pulses.  Pulmonary:     Effort: Pulmonary effort is normal.     Breath sounds: Normal breath sounds.  Musculoskeletal:        General: Normal range of motion.     Cervical back: Normal range of motion. No rigidity.  Lymphadenopathy:     Cervical: No cervical adenopathy.  Skin:    General: Skin is warm and dry.     Capillary Refill: Capillary refill takes less than 2 seconds.  Neurological:     General: No focal deficit present.     Mental Status: She is alert and oriented to person, place, and time.  Psychiatric:        Mood and Affect: Mood normal.        Behavior: Behavior  normal.        Thought Content: Thought content normal.     ED Results / Procedures / Treatments   Labs (all labs ordered are listed, but only abnormal results are displayed) Labs Reviewed - No data to display  EKG None  Radiology No results found.  Procedures Procedures (including critical care time)  Medications Ordered in ED Medications  oxyCODONE-acetaminophen (PERCOCET/ROXICET) 5-325 MG per tablet 1 tablet (1 tablet Oral Given 10/01/20 1002)    ED Course  I have reviewed the triage vital signs and the nursing notes.  Pertinent labs & imaging results that were available during my care of the patient were reviewed by me and considered in my medical decision making (see chart for details).    MDM Rules/Calculators/A&P                          ALEXXUS SOBH is a 29 y.o. female with past medical history of CKD, colon cancer, ileostomy in place presents emergency department today for dental pain.  Physical exam with infected tooth, no abscess palpated.  No trismus, drooling, normal range of motion to neck.  No concerns of deep space infection, Ludwigs, abscess.  No fever, no tachycardia, stable vitals.  Pain controlled with Percocet, will give antibiotics at this time, symptomatic treatment discussed.  Patient will see dentist on Monday.  Spoke to pharmacist, Dian Situ who recommended reduced dose of Augmentin due to CKD.  Strict  return precautions given to patient, patient to be discharged at this time.  Doubt ,need for further emergent work up at this time. I explained the diagnosis and have given explicit precautions to return to the ER including for any other new or worsening symptoms. The patient understands and accepts the medical plan as it's been dictated and I have answered their questions. Discharge instructions concerning home care and prescriptions have been given. The patient is STABLE and is discharged to home in good condition.    Final Clinical Impression(s) / ED  Diagnoses Final diagnoses:  Dental infection    Rx / DC Orders ED Discharge Orders         Ordered    HYDROcodone-acetaminophen (NORCO/VICODIN) 5-325 MG tablet  Every 6 hours PRN        10/01/20 1002    amoxicillin-clavulanate (AUGMENTIN) 500-125 MG tablet  2 times daily        10/01/20 [address], PA-C 10/01/20 1031    Wyvonnia Dusky, MD 10/01/20 1421

## 2020-10-01 NOTE — Discharge Instructions (Signed)
You are seen today for dental infection. I want you to take the antibiotics as prescribed, please call your dentist as soon as possible you will most likely need to get that tooth pulled as we spoke about. Please use Tylenol as directed on the bottle for pain, when the pain is severe you can take Norco. Do note that there is Tylenol in [place], do not take more than 4 g daily. If you have any new or worsening concerning symptoms such as you cannot open your mouth, you start severely drooling or you feel as if you cannot breathe please come back to the emergency department. Continue to ice this, if you cannot be seen by your dentist you can use the list of dentist provided. I hope you feel better!

## 2020-10-01 NOTE — ED Triage Notes (Signed)
Pt presents with c/o right side dental pain along her jaw area. Pt reports pain started last night.

## 2021-03-19 IMAGING — CT CT ABD-PELV W/O CM
2 of 4 series · 15 of 46 positions shown, 17 images · non-contrast
Comparison: CT scan 07/13/2018 and 01/08/2019

CLINICAL DATA: Severe abdominal pain and body aches. Prior history
of colon cancer in 0700 with ileostomy. Acute renal failure.

EXAM:
CT ABDOMEN AND PELVIS WITHOUT CONTRAST
TECHNIQUE: Multidetector CT imaging of the abdomen and pelvis was performed
following the standard protocol without IV contrast.

[Series 2: axial st · axial · 0.59mm/px · z∈[+909,+1304]mm · 12 of 87 slices shown, 14 images]
[im 4/87  soft-tissue]
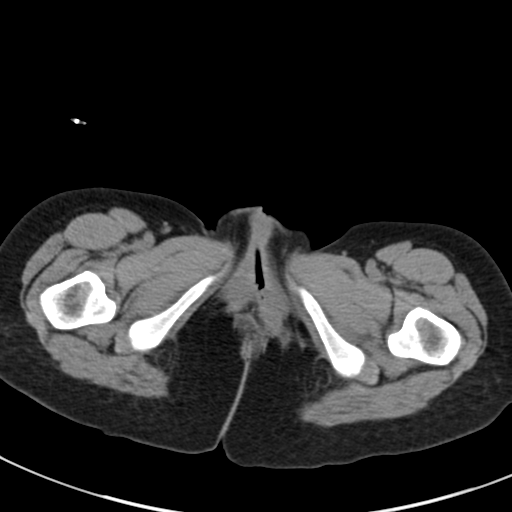
[im 4/87  bone]
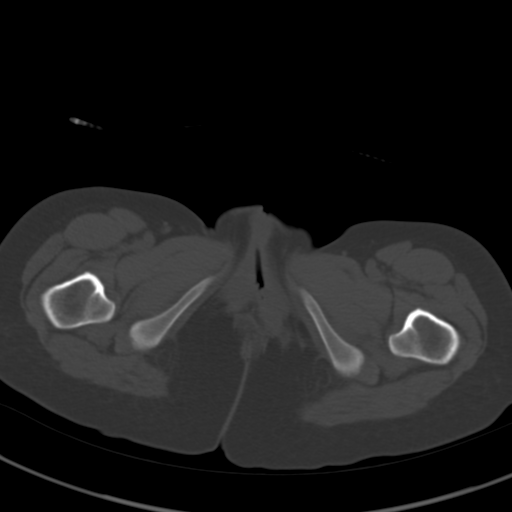
[im 12/87  soft-tissue]
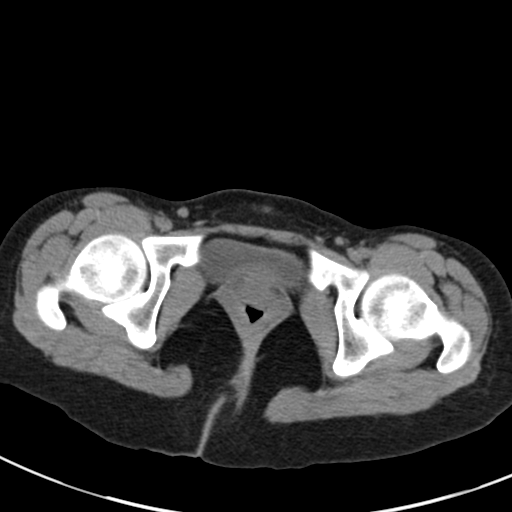
[im 20/87  soft-tissue]
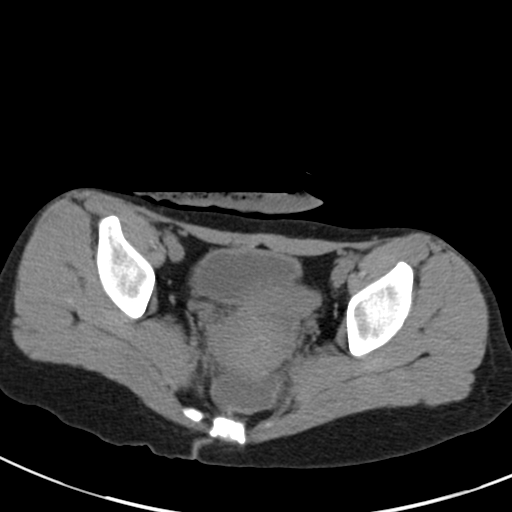
[im 28/87  soft-tissue]
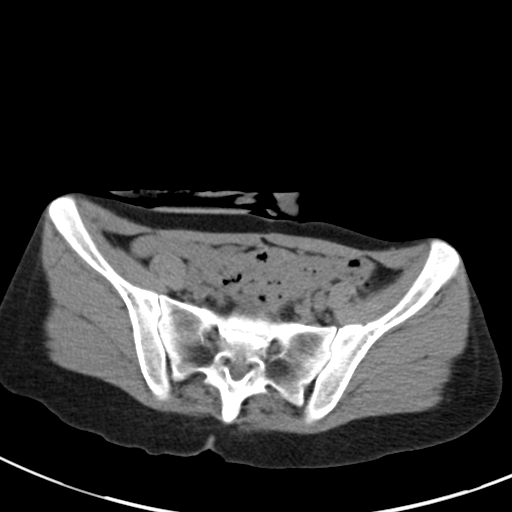
[im 32/87  soft-tissue]
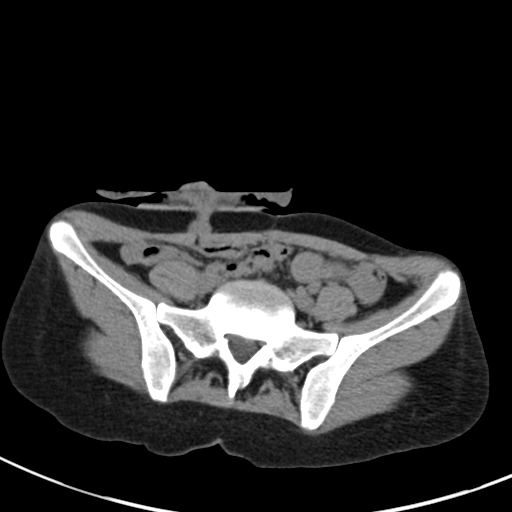
[im 40/87  soft-tissue]
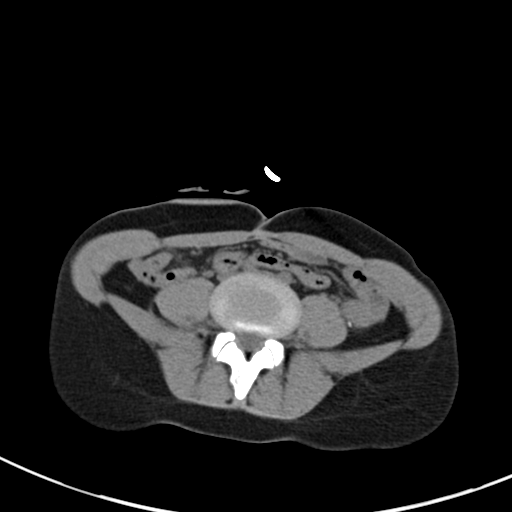
[im 47/87  soft-tissue]
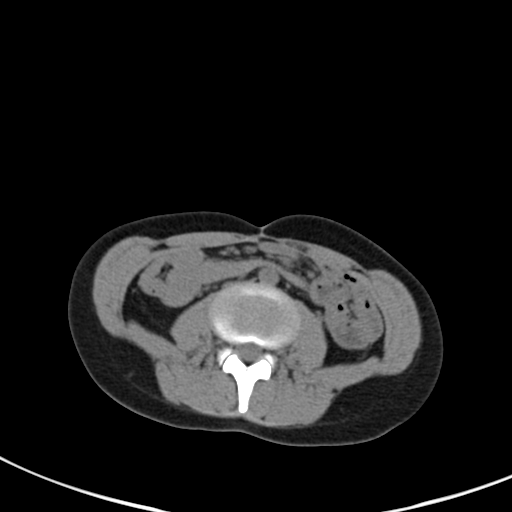
[im 55/87  soft-tissue]
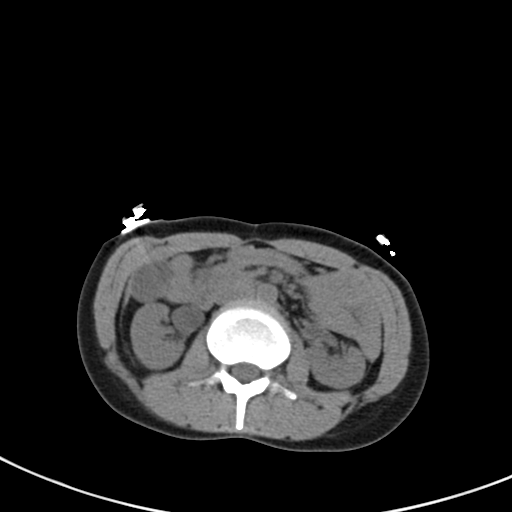
[im 59/87  soft-tissue]
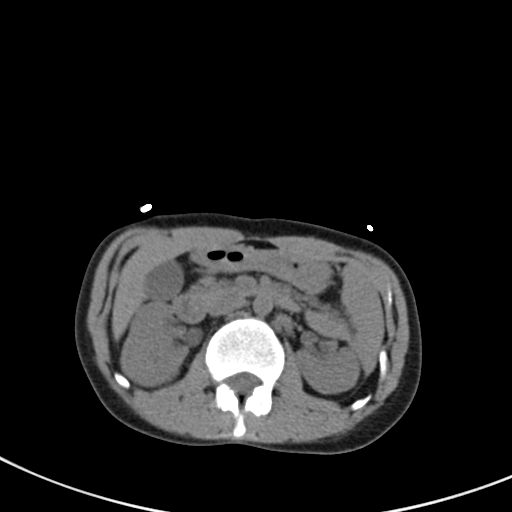
[im 59/87  bone]
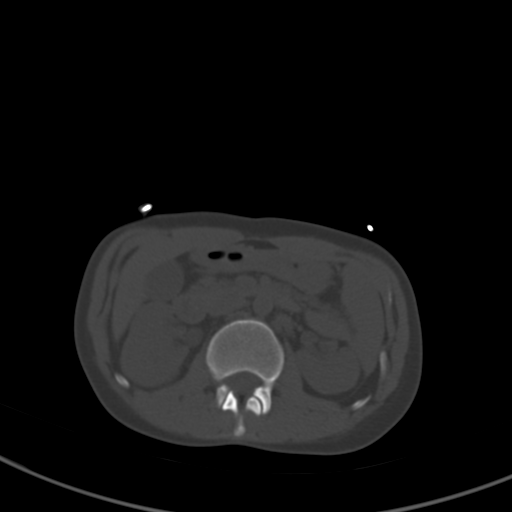
[im 67/87  soft-tissue]
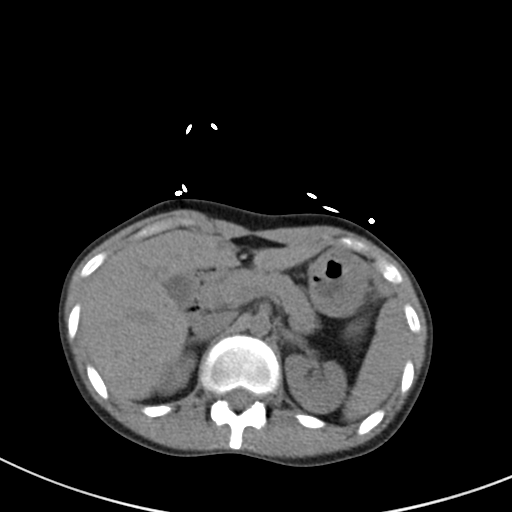
[im 75/87  soft-tissue]
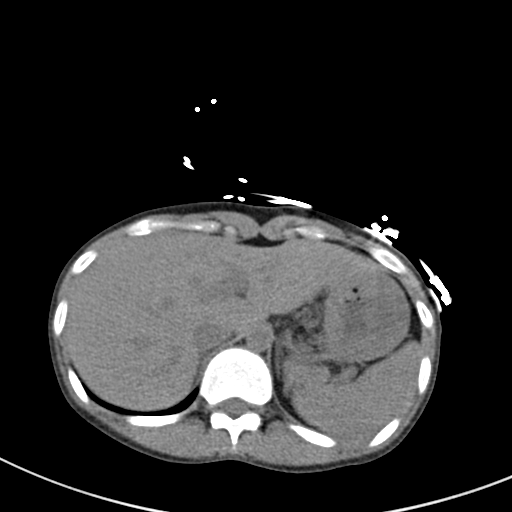
[im 83/87  soft-tissue]
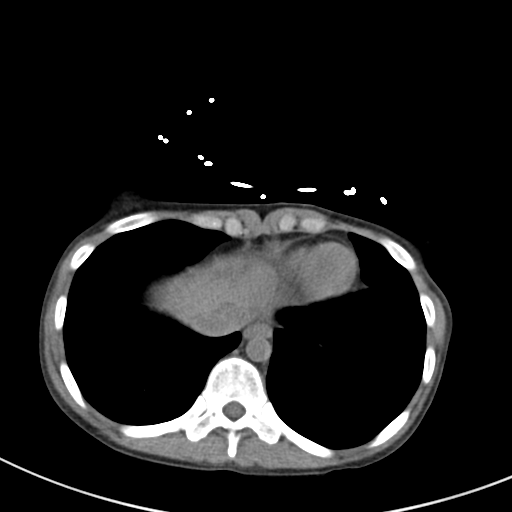

[Series 5: coronal st · coronal · 0.56mm/px · 3 of 80 slices shown]
[im 27/80  soft-tissue]
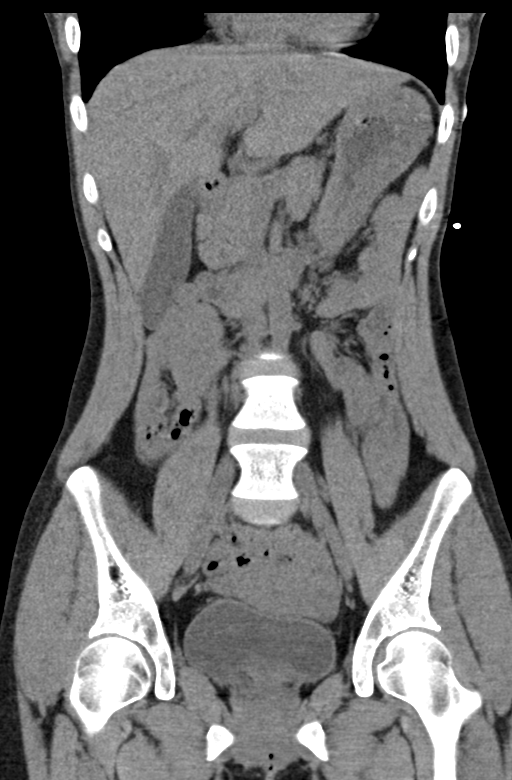
[im 36/80  soft-tissue]
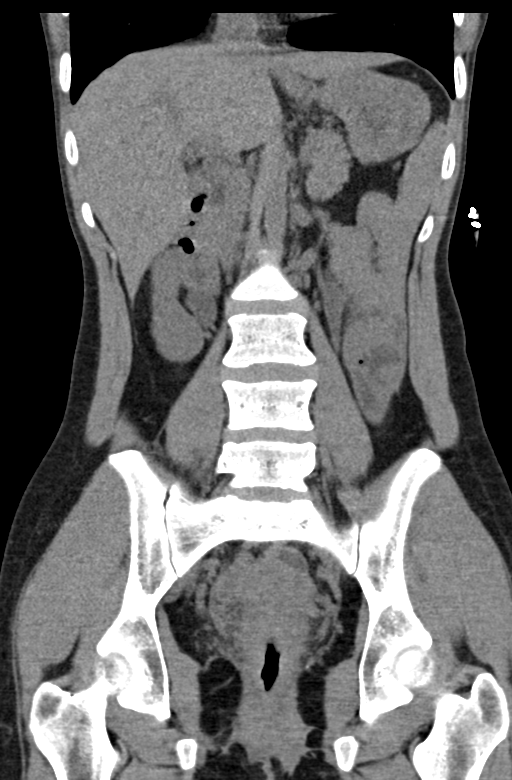
[im 44/80  soft-tissue]
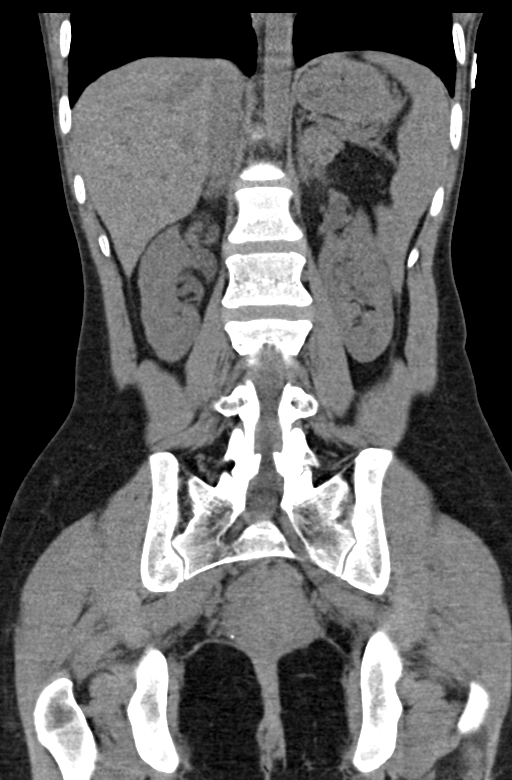

[15 of 46 positions shown; findings below may reference images not displayed]

FINDINGS: Lower chest: The lung bases are clear of acute process. No pleural
effusion or pulmonary lesions. The heart is normal in size. No
pericardial effusion. The distal esophagus and aorta are
unremarkable.

Hepatobiliary: No focal hepatic lesions or intrahepatic biliary
dilatation. The gallbladder appears normal. No common bile duct
dilatation.

Pancreas: No mass, inflammation or ductal dilatation.

Spleen: Normal size.  No focal lesions.

Adrenals/Urinary Tract: The adrenal glands are normal.

Small bilateral renal calculi are noted but no obstructing ureteral
calculi or bladder calculi.

No worrisome renal lesions are identified. There are prominent
bilateral extrarenal pelves again noted. There is also a duplicated
left collecting system.

Stomach/Bowel: The stomach, duodenum and small bowel are
unremarkable. Patient is status post colectomy and right lower
quadrant ileostomy.

Vascular/Lymphatic: The aorta is normal in caliber. No
atheroscerlotic calcifications. No mesenteric of retroperitoneal
mass or adenopathy. Small scattered lymph nodes are noted.

Reproductive: The uterus and ovaries are unremarkable.

Other: Small amount of free pelvic fluid is likely physiologic.

Musculoskeletal: No significant bony findings.
IMPRESSION: 1. Small bilateral renal calculi but no obstructing ureteral calculi
or bladder calculi.
2. Status post colectomy and right lower quadrant ileostomy. No
findings for obstruction.
3. Small amount of free pelvic fluid, likely physiologic.

## 2021-04-11 ENCOUNTER — Emergency Department (HOSPITAL_COMMUNITY)
Admission: EM | Admit: 2021-04-11 | Discharge: 2021-04-11 | Disposition: A | Discharge status: Home / Self Care | Payer: PRIVATE HEALTH INSURANCE | Attending: Emergency Medicine | Admitting: Emergency Medicine

## 2021-04-11 ENCOUNTER — Encounter (HOSPITAL_COMMUNITY): Payer: Self-pay | Admitting: *Deleted

## 2021-04-11 DIAGNOSIS — E876 Hypokalemia: Secondary | ICD-10-CM | POA: Insufficient documentation

## 2021-04-11 DIAGNOSIS — R197 Diarrhea, unspecified: Secondary | ICD-10-CM | POA: Insufficient documentation

## 2021-04-11 DIAGNOSIS — R1031 Right lower quadrant pain: Secondary | ICD-10-CM | POA: Diagnosis not present

## 2021-04-11 DIAGNOSIS — Z932 Ileostomy status: Secondary | ICD-10-CM | POA: Diagnosis not present

## 2021-04-11 DIAGNOSIS — E860 Dehydration: Secondary | ICD-10-CM | POA: Diagnosis not present

## 2021-04-11 DIAGNOSIS — N182 Chronic kidney disease, stage 2 (mild): Secondary | ICD-10-CM | POA: Diagnosis not present

## 2021-04-11 DIAGNOSIS — R000 Tachycardia, unspecified: Secondary | ICD-10-CM | POA: Insufficient documentation

## 2021-04-11 DIAGNOSIS — Z85038 Personal history of other malignant neoplasm of large intestine: Secondary | ICD-10-CM | POA: Insufficient documentation

## 2021-04-11 DIAGNOSIS — R112 Nausea with vomiting, unspecified: Secondary | ICD-10-CM | POA: Diagnosis present

## 2021-04-11 LAB — COMPREHENSIVE METABOLIC PANEL
ALT: 12 U/L (ref 0–44)
AST: 16 U/L (ref 15–41)
Albumin: 4.3 g/dL (ref 3.5–5.0)
Alkaline Phosphatase: 103 U/L (ref 38–126)
Anion gap: 15 (ref 5–15)
BUN: 51 mg/dL — ABNORMAL HIGH (ref 6–20)
CO2: 11 mmol/L — ABNORMAL LOW (ref 22–32)
Calcium: 7.7 mg/dL — ABNORMAL LOW (ref 8.9–10.3)
Chloride: 99 mmol/L (ref 98–111)
Creatinine, Ser: 2.87 mg/dL — ABNORMAL HIGH (ref 0.44–1.00)
GFR, Estimated: 22 mL/min — ABNORMAL LOW (ref >60)
Glucose, Bld: 137 mg/dL — ABNORMAL HIGH (ref 70–99)
Potassium: 2.6 mmol/L — CL (ref 3.5–5.1)
Sodium: 125 mmol/L — ABNORMAL LOW (ref 135–145)
Total Bilirubin: 0.6 mg/dL (ref 0.3–1.2)
Total Protein: 7.9 g/dL (ref 6.5–8.1)

## 2021-04-11 LAB — I-STAT BETA HCG BLOOD, ED (MC, WL, AP ONLY): I-stat hCG, quantitative: <5 m[IU]/mL (ref <5)

## 2021-04-11 LAB — URINALYSIS, ROUTINE W REFLEX MICROSCOPIC
Bilirubin Urine: NEGATIVE
Glucose, UA: NEGATIVE mg/dL
Hgb urine dipstick: NEGATIVE
Ketones, ur: NEGATIVE mg/dL
Nitrite: NEGATIVE
Protein, ur: NEGATIVE mg/dL
Specific Gravity, Urine: 1.011 (ref 1.005–1.030)
pH: 6 (ref 5.0–8.0)

## 2021-04-11 LAB — CBC
HCT: 34.6 % — ABNORMAL LOW (ref 36.0–46.0)
Hemoglobin: 11.8 g/dL — ABNORMAL LOW (ref 12.0–15.0)
MCH: 27.3 pg (ref 26.0–34.0)
MCHC: 34.1 g/dL (ref 30.0–36.0)
MCV: 79.9 fL — ABNORMAL LOW (ref 80.0–100.0)
Platelets: 369 10*3/uL (ref 150–400)
RBC: 4.33 MIL/uL (ref 3.87–5.11)
RDW: 13 % (ref 11.5–15.5)
WBC: 8.4 10*3/uL (ref 4.0–10.5)
nRBC: 0 % (ref 0.0–0.2)

## 2021-04-11 LAB — MAGNESIUM: Magnesium: 1.9 mg/dL (ref 1.7–2.4)

## 2021-04-11 LAB — LIPASE, BLOOD: Lipase: 79 U/L — ABNORMAL HIGH (ref 11–51)

## 2021-04-11 MED ORDER — ONDANSETRON HCL 4 MG/2ML IJ SOLN
4.0000 mg | Freq: Once | INTRAMUSCULAR | Status: AC
  Administered 2021-04-11: 4 mg via INTRAVENOUS
  Filled 2021-04-11: qty 2

## 2021-04-11 MED ORDER — FENTANYL CITRATE (PF) 100 MCG/2ML IJ SOLN
100.0000 ug | Freq: Once | INTRAMUSCULAR | Status: AC
Start: 2021-04-11 — End: 2021-04-11
  Administered 2021-04-11: 100 ug via INTRAVENOUS
  Filled 2021-04-11: qty 2

## 2021-04-11 MED ORDER — POTASSIUM CHLORIDE 10 MEQ/100ML IV SOLN
10.0000 meq | Freq: Once | INTRAVENOUS | Status: AC
  Administered 2021-04-11: 10 meq via INTRAVENOUS
  Filled 2021-04-11: qty 100

## 2021-04-11 MED ORDER — FENTANYL CITRATE (PF) 100 MCG/2ML IJ SOLN
50.0000 ug | Freq: Once | INTRAMUSCULAR | Status: AC
Start: 2021-04-11 — End: 2021-04-11
  Administered 2021-04-11: 50 ug via INTRAVENOUS
  Filled 2021-04-11: qty 2

## 2021-04-11 MED ORDER — ONDANSETRON 4 MG PO TBDP: 4.0000 mg | ORAL_TABLET | Freq: Three times a day (TID) | ORAL | 0 refills | Status: DC | PRN

## 2021-04-11 MED ORDER — LACTATED RINGERS IV BOLUS
1000.0000 mL | Freq: Once | INTRAVENOUS | Status: AC
  Administered 2021-04-11: 1000 mL via INTRAVENOUS

## 2021-04-11 MED ORDER — POTASSIUM CHLORIDE CRYS ER 20 MEQ PO TBCR: 20.0000 meq | EXTENDED_RELEASE_TABLET | Freq: Two times a day (BID) | ORAL | 0 refills | Status: DC

## 2021-04-11 NOTE — ED Provider Notes (Signed)
Emergency Medicine Provider Triage Evaluation Note  Cindy [name] , a 30 y.o. female  was evaluated in triage.  Pt complains of abd pain.  Review of Systems  Positive: Nausea, vomiting, loose stool, increase ileostomy output, abd pain Negative: Fever, dysuria  Physical Exam  BP (!) 106/91 (BP Location: Left Arm)   Pulse (!) 120   Temp 98.1 F (36.7 C) (Oral)   Resp 16   SpO2 100%  Gen:   Awake, no distress   Resp:  Normal effort  MSK:   Moves extremities without difficulty  Other:  Diffused tenderness throughout abdomen.  Normal appearing stoma  Medical Decision Making  Medically screening exam initiated at 1:01 PM.  Appropriate orders placed.  Cindy [name] was informed that the remainder of the evaluation will be completed by another provider, this initial triage assessment does not replace that evaluation, and the importance of remaining in the ED until their evaluation is complete.  abd pain with n/v, increase ileostomy output x 2 days.  Feels dehydrated.     Domenic Moras, PA-C 04/11/21 1311    Gareth Morgan, MD 04/13/21 1544

## 2021-04-11 NOTE — ED Provider Notes (Signed)
[place] DEPT Provider Note   CSN: [id number] Arrival date & time: 04/11/21  1243     History Chief Complaint  Patient presents with  . Abdominal Pain  . Emesis    Cindy [name] is a 30 y.o. female.  HPI Patient presents with nausea vomiting diarrhea.  Also abdominal pain.  Has had for around 2 days.  History of ileostomy due to previous colon cancer.  States she has episodes like this where she will get dehydrated.  No known sick contacts.  States she feels as if her potassium is low 2.  No fevers or chills.  No dysuria.  Pain is dull and diffuse.  Typical to pain is when she gets dehydrated.    Past Medical History:  Diagnosis Date  . Bilateral flank pain    due to ureteral stents  . Chronic hypokalemia   . CKD (chronic kidney disease), stage II   . 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 Complication of anesthesia    post op aspiration pneumonia w/ lap. appy 12-23-2007 (emergent ruptured appendix)  . FAP (familial adenomatous polyposis) 09/30/2014  . Heart murmur   . History of acute renal failure    2015;  2016  . History of fetal demise, not currently pregnant    03-22-2013  stillborn at 69 wks  . History of kidney stones   . History of sepsis    admitted 12-03-2016 (post-op day 2 w/ bilateral ureteral stents and stone manipluation) discharged 12-06-2016  for urosepsis  . Hypomagnesemia   . Ileostomy in place [facility])   . Iron deficiency anemia   . Nephrolithiasis    BILATERAL   . Normocytic anemia   . Renal cyst, left    PER CT 11-12-2016  . Urgency of urination     Patient Active Problem List   Diagnosis Date Noted  . Severe sepsis ([place]) 08/16/2020  . Pneumonia 07/18/2020  . Hyponatremia 07/18/2020  . Prolonged QT interval 07/18/2020   . Hypomagnesemia 07/18/2020  . Hypophosphatemia 07/18/2020  . Lower abdominal pain 07/13/2018  . Elevated lipase 07/13/2018  . AKI (acute kidney injury) ([place]) 09/02/2017  . Dehydration 06/07/2017  . Sepsis, unspecified organism ([place]) 12/03/2016  . 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 Normocytic anemia 05/02/2015  . Hypotension   . High output ileostomy ([place]) 09/30/2014  . FAP (familial adenomatous polyposis) 09/30/2014  . Nephrolithiasis 09/30/2014  . Adenocarcinoma of colon ([place]) 05/26/2013  . Nausea with vomiting 05/21/2013  . Microcytic anemia 04/25/2013  . Hypokalemia 04/25/2013    Past Surgical History:  Procedure Laterality Date  . ABDOMINAL SURGERY    . COLONOSCOPY N/A 05/24/2013   Procedure: COLONOSCOPY;  Surgeon: Missy Sabins, MD;  Location: [facility];  Service: Endoscopy;  Laterality: N/A;  . COMPLETION PROTECTOMY AND REVISION ILEOSTOMY/ LYSIS ADHESIONS  07-28-2014    [facility]  . CYSTOSCOPY W/ RETROGRADES Right 12/15/2016   Procedure: CYSTOSCOPY WITH RETROGRADE PYELOGRAM;  Surgeon: Alexis Frock, MD;  Location: [facility];  Service: Urology;  Laterality: Right;  . CYSTOSCOPY W/ URETERAL STENT PLACEMENT Bilateral 11/01/2016   Procedure: CYSTOSCOPY WITH BILATERAL RETROGRADE PYELOGRAM BILATERAL URETERAL STENT PLACEMENT;  Surgeon: Alexis Frock, MD;  Location: [facility];  Service: Urology;  Laterality: Bilateral;  . CYSTOSCOPY W/ URETERAL  STENT PLACEMENT Right 12/15/2016   Procedure: CYSTOSCOPY WITH STENT REPLACEMENT;  Surgeon: Alexis Frock, MD;  Location: [facility];  Service: Urology;  Laterality: Right;  . CYSTOSCOPY W/ URETERAL STENT REMOVAL Bilateral 12/15/2016   Procedure: CYSTOSCOPY WITH STENT REMOVAL;  Surgeon: Alexis Frock, MD;  Location: [facility];  Service: Urology;  Laterality: Bilateral;  . CYSTOSCOPY WITH URETEROSCOPY Bilateral 12/01/2016   Procedure: FIRST STAGE CYSTOSCOPY WITH URETEROSCOPY,  STONE  MANIPULATION and BASKETRY, STENT EXCHANGE;  Surgeon: Alexis Frock, MD;  Location: [facility];  Service: Urology;  Laterality: Bilateral;  . CYSTOSCOPY WITH URETEROSCOPY Right 12/15/2016   Procedure: SECOND STAGE -CYSTOSCOPY WITH URETEROSCOPY AND STONE EXTRACTION WITH BASKET;  Surgeon: Alexis Frock, MD;  Location: [facility];  Service: Urology;  Laterality: Right;  . HOLMIUM LASER APPLICATION Bilateral 3/0/0762   Procedure: HOLMIUM LASER APPLICATION;  Surgeon: Alexis Frock, MD;  Location: [facility];  Service: Urology;  Laterality: Bilateral;  . HOLMIUM LASER APPLICATION Right 2/63/3354   Procedure: HOLMIUM LASER APPLICATION;  Surgeon: Alexis Frock, MD;  Location: [facility];  Service: Urology;  Laterality: Right;  . I & D EXTREMITY Left 11/14/2013   Procedure: IRRIGATION AND DEBRIDEMENT LEFT LONG FINGER;  Surgeon: Tennis Must, MD;  Location: [place];  Service: Orthopedics;  Laterality: Left;  . IM NAILING TIBIA Left 08/23/2010  . LAPAROSCOPIC APPENDECTOMY  12/23/2007  . LEG RECONSTRUCTION USING FASCIAL FLAP  ~ 2009  . SUBTOTAL COLECTOMY /  RESECTION ENBLOC DISTAL ILEUM/  ILEOSTOMY  07/ 2014  [facility]  . TRANSTHORACIC ECHOCARDIOGRAM  11/11/2011   ef 55-60%/  mild MR/  trivial PR and TR/  PASP 29-73mmHg/  trivial pericardial effusion identified     OB History    Gravida  1   Para  1   Term  0   Preterm  1   AB  0   Living        SAB  0   IAB  0   Ectopic  0   Multiple      Live Births              Family History  Adopted: Yes  Problem Relation Age of Onset  . ALS Mother   . Healthy Father   . Alcohol abuse Neg Hx   . Arthritis Neg Hx   . Asthma Neg Hx   . Birth defects Neg Hx   . Cancer Neg Hx   . COPD Neg Hx   . Depression Neg Hx   . Diabetes Neg Hx   . Drug abuse Neg Hx   . Early death Neg Hx   . Hearing loss Neg Hx   . Heart disease Neg Hx   . Hyperlipidemia Neg Hx    . Hypertension Neg Hx   . Kidney disease Neg Hx   . Learning disabilities Neg Hx   . Mental illness Neg Hx   . Mental retardation Neg Hx   . Miscarriages / Stillbirths Neg Hx   . Stroke Neg Hx   . Vision loss Neg Hx     Social History   Tobacco Use  . Smoking status: Never Smoker  . Smokeless tobacco: Never Used  Vaping Use  . Vaping Use: Never used  Substance Use Topics  . Alcohol use: No  . Drug use: No    Home Medications Prior to Admission medications   Medication Sig Start Date End Date Taking?  Authorizing Provider  ondansetron (ZOFRAN-ODT) 4 MG disintegrating tablet Take 1 tablet (4 mg total) by mouth every 8 (eight) hours as needed for nausea or vomiting. 04/11/21  Yes Davonna Belling, MD  potassium chloride SA (KLOR-CON) 20 MEQ tablet Take 1 tablet (20 mEq total) by mouth 2 (two) times daily. 04/11/21  Yes Davonna Belling, MD  acetaminophen (TYLENOL) 325 MG tablet Take 650 mg by mouth every 6 (six) hours as needed for mild pain or headache.    [provider]  benzonatate (TESSALON) 200 MG capsule Take 1 capsule (200 mg total) by mouth 3 (three) times daily as needed for cough. 07/21/20   Rai, Ripudeep K, MD  cyanocobalamin (,VITAMIN B-12,) 1000 MCG/ML injection Inject 1,000 mcg into the muscle once a week.     [provider]  guaiFENesin-codeine 100-10 MG/5ML syrup Take 5 mLs by mouth 2 (two) times daily as needed for cough. 07/21/20   Rai, Vernelle Emerald, MD  HYDROcodone-acetaminophen (NORCO/VICODIN) 5-325 MG tablet Take 1 tablet by mouth every 6 (six) hours as needed for up to 3 doses for severe pain. 10/01/20   Alfredia Client, PA-C  medroxyPROGESTERone (DEPO-PROVERA) 150 MG/ML injection Inject 150 mg into the muscle every 3 (three) months.    [provider]  Multiple Vitamins-Minerals (MULTIVITAMIN GUMMIES ADULT) CHEW Chew 2 each by mouth daily.     [provider]  saccharomyces boulardii (FLORASTOR) 250 MG capsule Take 1 capsule (250  mg total) by mouth 2 (two) times daily. Any generic probiotic is okay 07/21/20   Rai, Vernelle Emerald, MD  Vitamin D, Ergocalciferol, (DRISDOL) 50000 units CAPS capsule Take 50,000 Units by mouth every Monday.  05/22/16   [provider]    Allergies    Food, Iron, Venofer [ferric oxide], Soap, Morphine and related, Sulfa antibiotics, Sulfamethoxazole-trimethoprim, and Levaquin [levofloxacin]  Review of Systems   Review of Systems  Constitutional: Negative for appetite change.  Cardiovascular: Negative for chest pain.  Gastrointestinal: Positive for abdominal pain, diarrhea, nausea and vomiting.  Skin: Negative for rash.  Neurological: Negative for weakness.  Psychiatric/Behavioral: Negative for confusion.    Physical Exam Updated Vital Signs BP (!) 84/48 (BP Location: Right Arm)   Pulse 94   Temp 98.1 F (36.7 C) (Oral)   Resp 16   SpO2 100%   Physical Exam Vitals and nursing note reviewed.  HENT:     Head: Atraumatic.  Cardiovascular:     Rate and Rhythm: Tachycardia present.  Pulmonary:     Effort: Pulmonary effort is normal.  Abdominal:     Comments: Ileostomy right lower quadrant.  Mild diffuse tenderness.  No hernias palpated.  Skin:    General: Skin is warm.     Capillary Refill: Capillary refill takes less than 2 seconds.  Neurological:     Mental Status: She is alert and oriented to person, place, and time.  Psychiatric:        Behavior: Behavior normal.     ED Results / Procedures / Treatments   Labs (all labs ordered are listed, but only abnormal results are displayed) Labs Reviewed  LIPASE, BLOOD - Abnormal; Notable for the following components:      Result Value   Lipase 79 (*)    All other components within normal limits  COMPREHENSIVE METABOLIC PANEL - Abnormal; Notable for the following components:   Sodium 125 (*)    Potassium 2.6 (*)    CO2 11 (*)    Glucose, Bld 137 (*)  BUN 51 (*)    Creatinine, Ser 2.87 (*)    Calcium 7.7 (*)     GFR, Estimated 22 (*)    All other components within normal limits  CBC - Abnormal; Notable for the following components:   Hemoglobin 11.8 (*)    HCT 34.6 (*)    MCV 79.9 (*)    All other components within normal limits  URINALYSIS, ROUTINE W REFLEX MICROSCOPIC - Abnormal; Notable for the following components:   APPearance HAZY (*)    Leukocytes,Ua TRACE (*)    Bacteria, UA RARE (*)    All other components within normal limits  MAGNESIUM  I-STAT BETA HCG BLOOD, ED ([facility], [facility], [facility] ONLY)    EKG EKG Interpretation  Date/Time:  Monday Apr 11 2021 16:39:58 EDT Ventricular Rate:  94 PR Interval:  169 QRS Duration: 73 QT Interval:  367 QTC Calculation: 459 R Axis:   84 Text Interpretation: Sinus rhythm Probable left atrial enlargement Confirmed by Davonna Belling [phone]) on 04/11/2021 4:45:54 PM   Radiology No results found.  Procedures Procedures   Medications Ordered in ED Medications  lactated ringers bolus 1,000 mL (0 mLs Intravenous Stopped 04/11/21 1745)  potassium chloride 10 mEq in 100 mL IVPB (0 mEq Intravenous Stopped 04/11/21 1745)  fentaNYL (SUBLIMAZE) injection 50 mcg (50 mcg Intravenous Given 04/11/21 1535)  lactated ringers bolus 1,000 mL (0 mLs Intravenous Stopped 04/11/21 1637)  fentaNYL (SUBLIMAZE) injection 100 mcg (100 mcg Intravenous Given 04/11/21 1651)  ondansetron (ZOFRAN) injection 4 mg (4 mg Intravenous Given 04/11/21 1651)    ED Course  I have reviewed the triage vital signs and the nursing notes.  Pertinent labs & imaging results that were available during my care of the patient were reviewed by me and considered in my medical decision making (see chart for details).    MDM Rules/Calculators/A&P                          Patient with nausea vomiting diarrhea.  History of same.  Has ileostomy and has times where she gets dehydrated.  Does have some electrolyte abnormalities but similar to what she has had previously.  Although bicarb is somewhat low.   Feels better after IV fluids.  Is tolerated orals.  Has had IV potassium.  Magnesium reassuring.  Patient states this is similar to what she is had before and will be able to hydrate at home.  Will discharge home with antiemetic and potassium.  Has had previous prolonged QT but I think should benefit from short course of Zofran.  QT is fine today.  Discharge home with outpatient follow-up.  Doubt obstruction.  Doubt intra-abdominal infection Final Clinical Impression(s) / ED Diagnoses Final diagnoses:  Nausea vomiting and diarrhea  Hypokalemia    Rx / DC Orders ED Discharge Orders         Ordered    potassium chloride SA (KLOR-CON) 20 MEQ tablet  2 times daily        04/11/21 1920    ondansetron (ZOFRAN-ODT) 4 MG disintegrating tablet  Every 8 hours PRN        04/11/21 Toni Amend, MD 04/11/21 1923

## 2021-04-11 NOTE — ED Notes (Signed)
Ambulated to BR; tolerated well.

## 2021-04-11 NOTE — ED Triage Notes (Signed)
Pt complains of nausea, emesis, diarrhea x 1 day and feels dehydrated. Pt has ileostomy bag.

## 2021-04-11 NOTE — ED Notes (Signed)
Patient has had IV pain medication- continuous cardiac monitor and oxygen saturation monitor in place.  The patient reports that her BP are normally lower than normal.  Patient is currently receiving an IV fluid bolus.  The patient is alert and on her telephone, she answers all questions appropriately.  NOTE:  written for yellow MEWS score

## 2021-04-11 NOTE — ED Notes (Signed)
Patient was given 2 cans of ginger ale, to start drinking for po fluid challenge

## 2021-04-11 NOTE — ED Notes (Signed)
Tolerated liquids well

## 2021-05-29 ENCOUNTER — Other Ambulatory Visit: Payer: Self-pay

## 2021-05-29 ENCOUNTER — Inpatient Hospital Stay (HOSPITAL_COMMUNITY): Payer: PRIVATE HEALTH INSURANCE

## 2021-05-29 ENCOUNTER — Inpatient Hospital Stay (HOSPITAL_COMMUNITY)
Admission: EM | Admit: 2021-05-29 | Discharge: 2021-05-31 | DRG: 683 | Disposition: A | Discharge status: Home / Self Care | Payer: PRIVATE HEALTH INSURANCE | Attending: Internal Medicine | Admitting: Internal Medicine

## 2021-05-29 DIAGNOSIS — Z85038 Personal history of other malignant neoplasm of large intestine: Secondary | ICD-10-CM | POA: Diagnosis not present

## 2021-05-29 DIAGNOSIS — Z9049 Acquired absence of other specified parts of digestive tract: Secondary | ICD-10-CM | POA: Diagnosis not present

## 2021-05-29 DIAGNOSIS — D72829 Elevated white blood cell count, unspecified: Secondary | ICD-10-CM | POA: Diagnosis present

## 2021-05-29 DIAGNOSIS — R9431 Abnormal electrocardiogram [ECG] [EKG]: Secondary | ICD-10-CM | POA: Diagnosis present

## 2021-05-29 DIAGNOSIS — N179 Acute kidney failure, unspecified: Secondary | ICD-10-CM | POA: Diagnosis present

## 2021-05-29 DIAGNOSIS — E876 Hypokalemia: Secondary | ICD-10-CM | POA: Diagnosis present

## 2021-05-29 DIAGNOSIS — Z888 Allergy status to other drugs, medicaments and biological substances status: Secondary | ICD-10-CM

## 2021-05-29 DIAGNOSIS — E861 Hypovolemia: Secondary | ICD-10-CM | POA: Diagnosis present

## 2021-05-29 DIAGNOSIS — R112 Nausea with vomiting, unspecified: Secondary | ICD-10-CM | POA: Diagnosis present

## 2021-05-29 DIAGNOSIS — Z881 Allergy status to other antibiotic agents status: Secondary | ICD-10-CM | POA: Diagnosis not present

## 2021-05-29 DIAGNOSIS — E860 Dehydration: Secondary | ICD-10-CM | POA: Diagnosis present

## 2021-05-29 DIAGNOSIS — Z885 Allergy status to narcotic agent status: Secondary | ICD-10-CM | POA: Diagnosis not present

## 2021-05-29 DIAGNOSIS — I959 Hypotension, unspecified: Secondary | ICD-10-CM | POA: Diagnosis present

## 2021-05-29 DIAGNOSIS — R198 Other specified symptoms and signs involving the digestive system and abdomen: Secondary | ICD-10-CM

## 2021-05-29 DIAGNOSIS — N184 Chronic kidney disease, stage 4 (severe): Secondary | ICD-10-CM | POA: Diagnosis present

## 2021-05-29 DIAGNOSIS — Z932 Ileostomy status: Secondary | ICD-10-CM | POA: Diagnosis not present

## 2021-05-29 DIAGNOSIS — Z20822 Contact with and (suspected) exposure to covid-19: Secondary | ICD-10-CM | POA: Diagnosis present

## 2021-05-29 DIAGNOSIS — Z882 Allergy status to sulfonamides status: Secondary | ICD-10-CM

## 2021-05-29 DIAGNOSIS — R109 Unspecified abdominal pain: Secondary | ICD-10-CM | POA: Diagnosis present

## 2021-05-29 DIAGNOSIS — N189 Chronic kidney disease, unspecified: Secondary | ICD-10-CM | POA: Diagnosis present

## 2021-05-29 DIAGNOSIS — R6510 Systemic inflammatory response syndrome (SIRS) of non-infectious origin without acute organ dysfunction: Secondary | ICD-10-CM | POA: Diagnosis present

## 2021-05-29 DIAGNOSIS — D126 Benign neoplasm of colon, unspecified: Secondary | ICD-10-CM | POA: Diagnosis present

## 2021-05-29 DIAGNOSIS — D1391 Familial adenomatous polyposis: Secondary | ICD-10-CM | POA: Diagnosis present

## 2021-05-29 LAB — COMPREHENSIVE METABOLIC PANEL
ALT: 13 U/L (ref 0–44)
AST: 30 U/L (ref 15–41)
Albumin: 5 g/dL (ref 3.5–5.0)
Alkaline Phosphatase: 121 U/L (ref 38–126)
Anion gap: 20 — ABNORMAL HIGH (ref 5–15)
BUN: 61 mg/dL — ABNORMAL HIGH (ref 6–20)
CO2: 9 mmol/L — ABNORMAL LOW (ref 22–32)
Calcium: 7 mg/dL — ABNORMAL LOW (ref 8.9–10.3)
Chloride: 97 mmol/L — ABNORMAL LOW (ref 98–111)
Creatinine, Ser: 6.59 mg/dL — ABNORMAL HIGH (ref 0.44–1.00)
GFR, Estimated: 8 mL/min — ABNORMAL LOW (ref >60)
Glucose, Bld: 122 mg/dL — ABNORMAL HIGH (ref 70–99)
Potassium: 3.4 mmol/L — ABNORMAL LOW (ref 3.5–5.1)
Sodium: 126 mmol/L — ABNORMAL LOW (ref 135–145)
Total Bilirubin: 0.8 mg/dL (ref 0.3–1.2)
Total Protein: 9.3 g/dL — ABNORMAL HIGH (ref 6.5–8.1)

## 2021-05-29 LAB — CBC WITH DIFFERENTIAL/PLATELET
Abs Immature Granulocytes: 0.06 10*3/uL (ref 0.00–0.07)
Basophils Absolute: 0 10*3/uL (ref 0.0–0.1)
Basophils Relative: 0 %
Eosinophils Absolute: 0 10*3/uL (ref 0.0–0.5)
Eosinophils Relative: 0 %
HCT: 39.9 % (ref 36.0–46.0)
Hemoglobin: 13.4 g/dL (ref 12.0–15.0)
Immature Granulocytes: 0 %
Lymphocytes Relative: 9 %
Lymphs Abs: 1.5 10*3/uL (ref 0.7–4.0)
MCH: 27.7 pg (ref 26.0–34.0)
MCHC: 33.6 g/dL (ref 30.0–36.0)
MCV: 82.4 fL (ref 80.0–100.0)
Monocytes Absolute: 0.9 10*3/uL (ref 0.1–1.0)
Monocytes Relative: 5 %
Neutro Abs: 13.8 10*3/uL — ABNORMAL HIGH (ref 1.7–7.7)
Neutrophils Relative %: 86 %
Platelets: 427 10*3/uL — ABNORMAL HIGH (ref 150–400)
RBC: 4.84 MIL/uL (ref 3.87–5.11)
RDW: 13.7 % (ref 11.5–15.5)
WBC: 16.3 10*3/uL — ABNORMAL HIGH (ref 4.0–10.5)
nRBC: 0 % (ref 0.0–0.2)

## 2021-05-29 LAB — MAGNESIUM: Magnesium: 2.2 mg/dL (ref 1.7–2.4)

## 2021-05-29 LAB — PROCALCITONIN: Procalcitonin: 0.34 ng/mL

## 2021-05-29 LAB — RESP PANEL BY RT-PCR (FLU A&B, COVID) ARPGX2
Influenza A by PCR: NEGATIVE
Influenza B by PCR: NEGATIVE
SARS Coronavirus 2 by RT PCR: NEGATIVE

## 2021-05-29 LAB — BLOOD GAS, VENOUS
Acid-base deficit: 15.6 mmol/L — ABNORMAL HIGH (ref 0.0–2.0)
Bicarbonate: 12.2 mmol/L — ABNORMAL LOW (ref 20.0–28.0)
O2 Saturation: 43.8 %
Patient temperature: 98.6
pCO2, Ven: 36.4 mmHg — ABNORMAL LOW (ref 44.0–60.0)
pH, Ven: 7.15 — CL (ref 7.250–7.430)
pO2, Ven: <31 mmHg — CL (ref 32.0–45.0)

## 2021-05-29 LAB — LIPASE, BLOOD: Lipase: 114 U/L — ABNORMAL HIGH (ref 11–51)

## 2021-05-29 LAB — POC URINE PREG, ED: Preg Test, Ur: NEGATIVE

## 2021-05-29 LAB — LACTIC ACID, PLASMA: Lactic Acid, Venous: 0.9 mmol/L (ref 0.5–1.9)

## 2021-05-29 MED ORDER — FENTANYL CITRATE (PF) 100 MCG/2ML IJ SOLN
100.0000 ug | Freq: Once | INTRAMUSCULAR | Status: AC
Start: 2021-05-29 — End: 2021-05-29
  Administered 2021-05-29: 100 ug via INTRAVENOUS
  Filled 2021-05-29: qty 2

## 2021-05-29 MED ORDER — ACETAMINOPHEN 325 MG PO TABS: 650.0000 mg | ORAL_TABLET | Freq: Four times a day (QID) | ORAL | Status: DC | PRN

## 2021-05-29 MED ORDER — LACTATED RINGERS IV BOLUS
1000.0000 mL | Freq: Once | INTRAVENOUS | Status: AC
  Administered 2021-05-29: 1000 mL via INTRAVENOUS

## 2021-05-29 MED ORDER — ONDANSETRON HCL 4 MG/2ML IJ SOLN
4.0000 mg | Freq: Once | INTRAMUSCULAR | Status: AC
  Administered 2021-05-29: 4 mg via INTRAVENOUS
  Filled 2021-05-29: qty 2

## 2021-05-29 MED ORDER — HYDROMORPHONE HCL 1 MG/ML IJ SOLN
0.5000 mg | Freq: Once | INTRAMUSCULAR | Status: AC
  Administered 2021-05-29: 0.5 mg via INTRAVENOUS
  Filled 2021-05-29: qty 0.5

## 2021-05-29 MED ORDER — HEPARIN SODIUM (PORCINE) 5000 UNIT/ML IJ SOLN
5000.0000 [IU] | Freq: Three times a day (TID) | INTRAMUSCULAR | Status: DC
  Filled 2021-05-29 (×2): qty 1

## 2021-05-29 MED ORDER — LORAZEPAM 2 MG/ML IJ SOLN
0.5000 mg | Freq: Once | INTRAMUSCULAR | Status: AC | PRN
  Administered 2021-05-29: 0.5 mg via INTRAVENOUS
  Filled 2021-05-29: qty 1

## 2021-05-29 MED ORDER — POTASSIUM CHLORIDE 20 MEQ PO PACK: 40.0000 meq | PACK | Freq: Once | ORAL | Status: DC

## 2021-05-29 MED ORDER — SACCHAROMYCES BOULARDII 250 MG PO CAPS
250.0000 mg | ORAL_CAPSULE | Freq: Two times a day (BID) | ORAL | Status: DC
  Administered 2021-05-29 – 2021-05-31 (×3): 250 mg via ORAL
  Filled 2021-05-29 (×4): qty 1

## 2021-05-29 MED ORDER — HYDROMORPHONE HCL 1 MG/ML IJ SOLN
0.5000 mg | Freq: Once | INTRAMUSCULAR | Status: AC
  Administered 2021-05-29: 0.5 mg via INTRAVENOUS
  Filled 2021-05-29 (×2): qty 1

## 2021-05-29 MED ORDER — LACTATED RINGERS IV SOLN: INTRAVENOUS | Status: DC

## 2021-05-29 MED ORDER — ONDANSETRON HCL 4 MG PO TABS: 4.0000 mg | ORAL_TABLET | Freq: Four times a day (QID) | ORAL | Status: DC | PRN

## 2021-05-29 MED ORDER — METOCLOPRAMIDE HCL 5 MG/ML IJ SOLN
10.0000 mg | Freq: Once | INTRAMUSCULAR | Status: AC
  Administered 2021-05-29: 10 mg via INTRAVENOUS
  Filled 2021-05-29: qty 2

## 2021-05-29 MED ORDER — ACETAMINOPHEN 650 MG RE SUPP: 650.0000 mg | Freq: Four times a day (QID) | RECTAL | Status: DC | PRN

## 2021-05-29 MED ORDER — ONDANSETRON HCL 4 MG/2ML IJ SOLN: 4.0000 mg | Freq: Four times a day (QID) | INTRAMUSCULAR | Status: DC | PRN

## 2021-05-29 NOTE — ED Notes (Signed)
Lab phleb called for stick for lactic acid

## 2021-05-29 NOTE — H&P (Signed)
History and Physical    Cindy [name] DXI:[id number] DOB: December 05, 1990 DOA: 05/29/2021  PCP: Nicola Girt, DO  Patient coming from: Home  Chief Complaint: Nausea, vomiting, diarrhea, abdominal pain  HPI: Cindy [name] is a 30 y.o. female with medical history significant of colon cancer status post subtotal colectomy, ileostomy present, chronic kidney disease who presents to the hospital with chief complaint of nausea, vomiting, diarrhea, increased stool output from her ileostomy.  Also admitted to feeling weak and dizzy.  Patient denies any fevers, chest pain, cough.  On my examination, abdominal pain much improved after receiving pain medicines in the emergency department.  She states that normally, she changes her ileostomy bag 2-3 times daily, most recently it has increased to changing it 4-5 times daily.  ED Course: In the emergency department, patient was found to be hypotensive although not far off compared to her baseline of SBP 80s to 90s.  She received IV pain medicine, nausea medicine, IV fluid.  Review of Systems: As per HPI. Otherwise, all other review of systems reviewed and are negative.   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 appy 12-23-2007 (emergent ruptured appendix)   FAP (familial adenomatous polyposis) 09/30/2014   Heart murmur    History of acute renal failure    2015;  2016   History of fetal demise, not currently pregnant    03-22-2013  stillborn at 46 wks   History of kidney stones    History of sepsis    admitted 12-03-2016 (post-op day 2 w/  bilateral ureteral stents and stone manipluation) discharged 12-06-2016  for urosepsis   Hypomagnesemia    Ileostomy in place [facility])    Iron deficiency anemia    Nephrolithiasis    BILATERAL    Normocytic anemia    Renal cyst, left    PER CT 11-12-2016   Urgency of urination     Past Surgical History:  Procedure Laterality Date   ABDOMINAL SURGERY     COLONOSCOPY N/A 05/24/2013   Procedure: COLONOSCOPY;  Surgeon: Missy Sabins, MD;  Location: [facility];  Service: Endoscopy;  Laterality: N/A;   COMPLETION PROTECTOMY AND REVISION ILEOSTOMY/ LYSIS ADHESIONS  07-28-2014    [facility]   CYSTOSCOPY W/ RETROGRADES Right 12/15/2016   Procedure: CYSTOSCOPY WITH RETROGRADE PYELOGRAM;  Surgeon: Alexis Frock, MD;  Location: [facility];  Service: Urology;  Laterality: Right;   CYSTOSCOPY W/ URETERAL STENT PLACEMENT Bilateral 11/01/2016   Procedure: CYSTOSCOPY WITH BILATERAL RETROGRADE PYELOGRAM BILATERAL URETERAL STENT PLACEMENT;  Surgeon: Alexis Frock, MD;  Location: [facility];  Service: Urology;  Laterality: Bilateral;   CYSTOSCOPY W/ URETERAL STENT PLACEMENT Right 12/15/2016   Procedure: CYSTOSCOPY WITH STENT REPLACEMENT;  Surgeon: Alexis Frock, MD;  Location: [facility];  Service: Urology;  Laterality: Right;   CYSTOSCOPY W/ URETERAL STENT REMOVAL Bilateral 12/15/2016   Procedure: CYSTOSCOPY WITH STENT REMOVAL;  Surgeon: Alexis Frock, MD;  [facility];  Service: Urology;  Laterality: Bilateral;   CYSTOSCOPY WITH URETEROSCOPY Bilateral 12/01/2016  Procedure: FIRST STAGE CYSTOSCOPY WITH URETEROSCOPY,  STONE MANIPULATION and BASKETRY, STENT EXCHANGE;  Surgeon: Alexis Frock, MD;  Location: [facility];  Service: Urology;  Laterality: Bilateral;   CYSTOSCOPY WITH URETEROSCOPY Right 12/15/2016   Procedure: SECOND STAGE -CYSTOSCOPY WITH URETEROSCOPY AND STONE EXTRACTION WITH BASKET;  Surgeon: Alexis Frock, MD;  Location: [facility];  Service: Urology;  Laterality: Right;   HOLMIUM LASER APPLICATION Bilateral 06/04/2955   Procedure: HOLMIUM LASER APPLICATION;  Surgeon: Alexis Frock, MD;  Location: [facility];  Service: Urology;  Laterality: Bilateral;   HOLMIUM LASER APPLICATION Right 01/10/864   Procedure: HOLMIUM LASER APPLICATION;  Surgeon: Alexis Frock, MD;  Location: [facility];  Service: Urology;  Laterality: Right;   I & D EXTREMITY Left 11/14/2013   Procedure: IRRIGATION AND DEBRIDEMENT LEFT LONG FINGER;  Surgeon: Tennis Must, MD;  Location: [place];  Service: Orthopedics;  Laterality: Left;   IM NAILING TIBIA Left 08/23/2010   LAPAROSCOPIC APPENDECTOMY  12/23/2007   LEG RECONSTRUCTION USING FASCIAL FLAP  ~ 2009   SUBTOTAL COLECTOMY /  RESECTION ENBLOC DISTAL ILEUM/  ILEOSTOMY  07/ 2014  [facility]   TRANSTHORACIC ECHOCARDIOGRAM  11/11/2011   ef 55-60%/  mild MR/  trivial PR and TR/  PASP 29-23mmHg/  trivial pericardial effusion identified     reports that she has never smoked. She has never used smokeless tobacco. She reports that she does not drink alcohol and does not use drugs.  Allergies  Allergen Reactions   Food Anaphylaxis and Other (See Comments)    Orange juice   Iron Palpitations    Heart stops   Iron Sucrose Anaphylaxis    She had anaphylaxis to venofier but was given ferrumoxytol in allergy clinic by Dr. Antonietta Breach and tolerated well in 2020   Vancomycin Anaphylaxis   Venofer [Ferric Oxide] Anaphylaxis   Soap Hives, Itching and Other (See Comments)    Dove soap   Morphine And Related Swelling    When given IV, swelling around site   Sulfa Antibiotics Other (See Comments)    dehydration   Sulfamethoxazole-Trimethoprim Nausea And Vomiting    Patient developed AKI that appears to have been from bactrim. She might be able to take again if watched closely and it is absolutely needed - ie there was no anaphylaxis or allergic rash    Levaquin [Levofloxacin] Hives    Family History  Adopted: Yes  Problem Relation Age of Onset   ALS Mother    Healthy Father    Alcohol abuse Neg Hx    Arthritis Neg Hx    Asthma Neg Hx    Birth defects Neg Hx    Cancer Neg Hx    COPD Neg Hx    Depression Neg Hx    Diabetes Neg Hx    Drug abuse Neg Hx    Early death Neg Hx    Hearing loss Neg Hx    Heart disease Neg Hx    Hyperlipidemia Neg Hx    Hypertension Neg Hx    Kidney disease Neg Hx    Learning disabilities Neg Hx    Mental illness Neg Hx    Mental retardation Neg Hx    Miscarriages / Stillbirths Neg Hx    Stroke Neg Hx    Vision loss Neg Hx     Prior to Admission medications   Medication Sig Start Date End Date Taking? Authorizing Provider  acetaminophen (TYLENOL) 325 MG tablet Take  650 mg by mouth every 6 (six) hours as needed for mild pain or headache.    [provider]  benzonatate (TESSALON) 200 MG capsule Take 1 capsule (200 mg total) by mouth 3 (three) times daily as needed for cough. 07/21/20   Rai, Ripudeep K, MD  cyanocobalamin (,VITAMIN B-12,) 1000 MCG/ML injection Inject 1,000 mcg into the muscle once a week.     [provider]  guaiFENesin-codeine 100-10 MG/5ML syrup Take 5 mLs by mouth 2 (two) times daily as needed for cough. 07/21/20   Rai, Vernelle Emerald, MD  HYDROcodone-acetaminophen (NORCO/VICODIN) 5-325 MG tablet Take 1 tablet by mouth every 6 (six) hours as needed for up to 3 doses for severe pain. 10/01/20   Alfredia Client, PA-C  medroxyPROGESTERone (DEPO-PROVERA) 150 MG/ML injection Inject 150 mg into the muscle every 3 (three) months.    [provider]  Multiple Vitamins-Minerals (MULTIVITAMIN GUMMIES ADULT) CHEW Chew 2 each by mouth daily.     [provider]  ondansetron (ZOFRAN-ODT) 4 MG disintegrating tablet Take 1 tablet (4 mg total) by mouth every 8 (eight) hours as needed for nausea or vomiting. 04/11/21   Davonna Belling, MD  potassium chloride SA  (KLOR-CON) 20 MEQ tablet Take 1 tablet (20 mEq total) by mouth 2 (two) times daily. 04/11/21   Davonna Belling, MD  saccharomyces boulardii (FLORASTOR) 250 MG capsule Take 1 capsule (250 mg total) by mouth 2 (two) times daily. Any generic probiotic is okay 07/21/20   Rai, Vernelle Emerald, MD  Vitamin D, Ergocalciferol, (DRISDOL) 50000 units CAPS capsule Take 50,000 Units by mouth every Monday.  05/22/16   [provider]    Physical Exam: Vitals:   05/29/21 1509 05/29/21 1540 05/29/21 1700  BP: (!) 76/47 (!) 86/47 (!) 88/58  Pulse: (!) 109 100 94  Resp: 16    Temp: 98.3 F (36.8 C)    TempSrc: Oral    SpO2: 100% 98% 98%  Weight:  46.3 kg   Height:  5' (1.524 m)      Constitutional: NAD, calm, comfortable, thin Eyes: PERRL, lids and conjunctivae normal ENMT: Deferred, patient wearing a facemask Respiratory: Clear to auscultation bilaterally, no wheezing, no crackles. Normal respiratory effort. No accessory muscle use. No conversational dyspnea.  On room air Cardiovascular: Regular rate and rhythm, no murmurs. No extremity edema.  Abdomen: Soft, nondistended, nontender to palpation. Bowel sounds positive. + Ileostomy present Musculoskeletal: No joint deformity upper and lower extremities. No contractures. Normal muscle tone.  Skin: no rashes, lesions, ulcers on exposed skin  Neurologic: Alert and oriented, speech fluent, CN 2-12 grossly intact. No focal deficits.   Psychiatric: Normal judgment and insight. Normal mood and affect   Labs on Admission: I have personally reviewed following labs and imaging studies  CBC: Recent Labs  Lab 05/29/21 1527  WBC 16.3*  NEUTROABS 13.8*  HGB 13.4  HCT 39.9  MCV 82.4  PLT 503*   Basic Metabolic Panel: Recent Labs  Lab 05/29/21 1527  NA 126*  K 3.4*  CL 97*  CO2 9*  GLUCOSE 122*  BUN 61*  CREATININE 6.59*  CALCIUM 7.0*  MG 2.2   GFR: Estimated Creatinine Clearance: 8.8 mL/min (A) (by C-G formula based on SCr of 6.59 mg/dL  (H)). Liver Function Tests: Recent Labs  Lab 05/29/21 1527  AST 30  ALT 13  ALKPHOS 121  BILITOT 0.8  PROT 9.3*  ALBUMIN 5.0   Recent Labs  Lab 05/29/21 1527  LIPASE 114*   No  results for input(s): AMMONIA in the last 168 hours. Coagulation Profile: No results for input(s): INR, PROTIME in the last 168 hours. Cardiac Enzymes: No results for input(s): CKTOTAL, CKMB, CKMBINDEX, TROPONINI in the last 168 hours. BNP (last 3 results) No results for input(s): PROBNP in the last 8760 hours. HbA1C: No results for input(s): HGBA1C in the last 72 hours. CBG: No results for input(s): GLUCAP in the last 168 hours. Lipid Profile: No results for input(s): CHOL, HDL, LDLCALC, TRIG, CHOLHDL, LDLDIRECT in the last 72 hours. Thyroid Function Tests: No results for input(s): TSH, T4TOTAL, FREET4, T3FREE, THYROIDAB in the last 72 hours. Anemia Panel: No results for input(s): VITAMINB12, FOLATE, FERRITIN, TIBC, IRON, RETICCTPCT in the last 72 hours. Urine analysis:    Component Value Date/Time   COLORURINE YELLOW 04/11/2021 1400   APPEARANCEUR HAZY (A) 04/11/2021 1400   LABSPEC 1.011 04/11/2021 1400   PHURINE 6.0 04/11/2021 1400   GLUCOSEU NEGATIVE 04/11/2021 1400   HGBUR NEGATIVE 04/11/2021 1400   BILIRUBINUR NEGATIVE 04/11/2021 1400   KETONESUR NEGATIVE 04/11/2021 1400   PROTEINUR NEGATIVE 04/11/2021 1400   UROBILINOGEN 0.2 07/29/2015 0320   NITRITE NEGATIVE 04/11/2021 1400   LEUKOCYTESUR TRACE (A) 04/11/2021 1400   Sepsis Labs: !!!!!!!!!!!!!!!!!!!!!!!!!!!!!!!!!!!!!!!!!!!! @LABRCNTIP (procalcitonin:4,lacticidven:4) )No results found for this or any previous visit (from the past 240 hour(s)).   Radiological Exams on Admission: No results found.  EKG: Independently reviewed.  Normal sinus rhythm  Assessment/Plan Principal Problem:   AKI (acute kidney injury) ([place]) Active Problems:   Hypokalemia   High output ileostomy (HCC)   FAP (familial adenomatous polyposis)    Hypotension   CKD (chronic kidney disease) stage 4, GFR 15-29 ml/min (HCC)   Dehydration   Prolonged QT interval   AKI on CKD stage 4 -Followed by Nephrology Dr. 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   Difficult to place patient No       Severity of Illness: The appropriate patient status for this patient is INPATIENT. Inpatient status is judged to be reasonable and necessary in order to provide the required intensity of service to ensure the patient's safety. The patient's presenting symptoms, physical exam findings, and initial radiographic and laboratory data in the context of their chronic comorbidities is felt to place them at high risk for further clinical deterioration. Furthermore, it is not anticipated that  the patient will be medically stable for discharge from the hospital within 2 midnights of admission.  * I certify that at  the point of admission it is my clinical judgment that the patient will require inpatient hospital care spanning beyond 2 midnights from the point of admission due to high intensity of service, high risk for further deterioration and high frequency of surveillance required.Dessa Phi, DO [facility] 05/29/2021, 5:24 PM   Available via Epic secure chat 7am-7pm After these hours, please refer to coverage provider listed on [email or web address]

## 2021-05-29 NOTE — ED Triage Notes (Signed)
Patient c/o abdominal pain, N/v and states that she has had an increase in her ostomy output since yesterday.

## 2021-05-29 NOTE — ED Provider Notes (Signed)
[facility] DEPT Provider Note   CSN: [id number] Arrival date & time: 05/29/21  1441     History Chief Complaint  Patient presents with   Abdominal Pain   Emesis    Cindy [name] is a 30 y.o. female.  HPI 30 year old female presents with diarrhea, vomiting, abdominal pain.  Started yesterday with the diarrhea and abdominal pain.  Is across the middle of her abdomen and is sharp and severe.  These are typical issues she has had and typically is related to dehydration.  She states she normally has to come get IV pain medicine, nausea medicine, and fluids.  She has not had any fevers or urinary symptoms.  She is having increased stool out of her ileostomy but no change in the color and no blood.  She feels weak and dizzy.  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 appy 12-23-2007 (emergent ruptured appendix)   FAP (familial adenomatous polyposis) 09/30/2014   Heart murmur    History of acute renal failure    2015;  2016   History of fetal demise, not currently pregnant    03-22-2013  stillborn at 47 wks   History of kidney stones    History of sepsis    admitted 12-03-2016 (post-op day 2 w/ bilateral ureteral stents and stone manipluation) discharged 12-06-2016  for urosepsis   Hypomagnesemia    Ileostomy in place [facility])    Iron deficiency anemia    Nephrolithiasis    BILATERAL    Normocytic anemia    Renal cyst, left    PER CT 11-12-2016   Urgency of urination     Patient Active Problem List   Diagnosis Date Noted   Severe sepsis ([place])  08/16/2020   Pneumonia 07/18/2020   Hyponatremia 07/18/2020   Prolonged QT interval 07/18/2020   Hypomagnesemia 07/18/2020   Hypophosphatemia 07/18/2020   Lower abdominal pain 07/13/2018   Elevated lipase 07/13/2018   AKI (acute kidney injury) ([place]) 09/02/2017   Dehydration 06/07/2017   Sepsis, unspecified organism ([place]) 12/03/2016   CKD (chronic kidney disease), stage II 12/03/2016   Normocytic anemia 05/02/2015   Hypotension    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   FAP (familial adenomatous polyposis) 09/30/2014   Nephrolithiasis 09/30/2014   Adenocarcinoma of colon ([place]) 05/26/2013   Nausea with vomiting 05/21/2013   Microcytic anemia 04/25/2013   Hypokalemia 04/25/2013    Past Surgical History:  Procedure Laterality Date   ABDOMINAL SURGERY     COLONOSCOPY N/A 05/24/2013   Procedure: COLONOSCOPY;  Surgeon: Missy Sabins, MD;  Location: [facility];  Service: Endoscopy;  Laterality: N/A;   COMPLETION PROTECTOMY AND REVISION ILEOSTOMY/ LYSIS ADHESIONS  07-28-2014    [facility]   CYSTOSCOPY W/ RETROGRADES Right 12/15/2016   Procedure: CYSTOSCOPY WITH RETROGRADE PYELOGRAM;  Surgeon: Alexis Frock, MD;  Location: [facility];  Service: Urology;  Laterality: Right;   CYSTOSCOPY W/ URETERAL STENT PLACEMENT Bilateral 11/01/2016   Procedure: CYSTOSCOPY WITH BILATERAL RETROGRADE PYELOGRAM BILATERAL URETERAL STENT PLACEMENT;  Surgeon: Alexis Frock,  MD;  Location: [facility];  Service: Urology;  Laterality: Bilateral;   CYSTOSCOPY W/ URETERAL STENT PLACEMENT Right 12/15/2016   Procedure: CYSTOSCOPY WITH STENT REPLACEMENT;  Surgeon: Alexis Frock, MD;  Location: [facility];  Service: Urology;  Laterality: Right;   CYSTOSCOPY W/ URETERAL STENT REMOVAL Bilateral 12/15/2016   Procedure: CYSTOSCOPY WITH STENT REMOVAL;  Surgeon: Alexis Frock, MD;  Location: [facility];  Service: Urology;  Laterality: Bilateral;   CYSTOSCOPY WITH URETEROSCOPY Bilateral  12/01/2016   Procedure: FIRST STAGE CYSTOSCOPY WITH URETEROSCOPY,  STONE MANIPULATION and BASKETRY, STENT EXCHANGE;  Surgeon: Alexis Frock, MD;  Location: [facility];  Service: Urology;  Laterality: Bilateral;   CYSTOSCOPY WITH URETEROSCOPY Right 12/15/2016   Procedure: SECOND STAGE -CYSTOSCOPY WITH URETEROSCOPY AND STONE EXTRACTION WITH BASKET;  Surgeon: Alexis Frock, MD;  Location: [facility];  Service: Urology;  Laterality: Right;   HOLMIUM LASER APPLICATION Bilateral 12/03/107   Procedure: HOLMIUM LASER APPLICATION;  Surgeon: Alexis Frock, MD;  Location: [facility];  Service: Urology;  Laterality: Bilateral;   HOLMIUM LASER APPLICATION Right 02/17/5572   Procedure: HOLMIUM LASER APPLICATION;  Surgeon: Alexis Frock, MD;  Location: [facility];  Service: Urology;  Laterality: Right;   I & D EXTREMITY Left 11/14/2013   Procedure: IRRIGATION AND DEBRIDEMENT LEFT LONG FINGER;  Surgeon: Tennis Must, MD;  Location: [facility];  Service: Orthopedics;  Laterality: Left;   IM NAILING TIBIA Left 08/23/2010   LAPAROSCOPIC APPENDECTOMY  12/23/2007   LEG RECONSTRUCTION USING FASCIAL FLAP  ~ 2009   SUBTOTAL COLECTOMY /  RESECTION ENBLOC DISTAL ILEUM/  ILEOSTOMY  07/ 2014  [facility]   TRANSTHORACIC ECHOCARDIOGRAM  11/11/2011   ef 55-60%/  mild MR/  trivial PR and TR/  PASP 29-23mmHg/  trivial pericardial effusion identified     OB History     Gravida  1   Para  1   Term  0   Preterm  1   AB  0   Living         SAB  0   IAB  0   Ectopic  0   Multiple      Live Births              Family History  Adopted: Yes  Problem Relation Age of Onset   ALS Mother    Healthy Father    Alcohol abuse Neg Hx    Arthritis Neg Hx    Asthma Neg Hx    Birth defects Neg Hx    Cancer Neg Hx    COPD Neg Hx    Depression Neg Hx    Diabetes Neg Hx    Drug abuse Neg Hx    Early death Neg Hx    Hearing loss Neg Hx     Heart disease Neg Hx    Hyperlipidemia Neg Hx    Hypertension Neg Hx    Kidney disease Neg Hx    Learning disabilities Neg Hx    Mental illness Neg Hx    Mental retardation Neg Hx    Miscarriages / Stillbirths Neg Hx    Stroke Neg Hx    Vision loss Neg Hx     Social History   Tobacco Use   Smoking status: Never   Smokeless tobacco: Never  Vaping Use   Vaping Use: Never used  Substance Use Topics   Alcohol use: No   Drug use: No  Home Medications Prior to Admission medications   Medication Sig Start Date End Date Taking? Authorizing Provider  acetaminophen (TYLENOL) 325 MG tablet Take 650 mg by mouth every 6 (six) hours as needed for mild pain or headache.    [provider]  benzonatate (TESSALON) 200 MG capsule Take 1 capsule (200 mg total) by mouth 3 (three) times daily as needed for cough. 07/21/20   Rai, Ripudeep K, MD  cyanocobalamin (,VITAMIN B-12,) 1000 MCG/ML injection Inject 1,000 mcg into the muscle once a week.     [provider]  guaiFENesin-codeine 100-10 MG/5ML syrup Take 5 mLs by mouth 2 (two) times daily as needed for cough. 07/21/20   Rai, Vernelle Emerald, MD  HYDROcodone-acetaminophen (NORCO/VICODIN) 5-325 MG tablet Take 1 tablet by mouth every 6 (six) hours as needed for up to 3 doses for severe pain. 10/01/20   Alfredia Client, PA-C  medroxyPROGESTERone (DEPO-PROVERA) 150 MG/ML injection Inject 150 mg into the muscle every 3 (three) months.    [provider]  Multiple Vitamins-Minerals (MULTIVITAMIN GUMMIES ADULT) CHEW Chew 2 each by mouth daily.     [provider]  ondansetron (ZOFRAN-ODT) 4 MG disintegrating tablet Take 1 tablet (4 mg total) by mouth every 8 (eight) hours as needed for nausea or vomiting. 04/11/21   Davonna Belling, MD  potassium chloride SA (KLOR-CON) 20 MEQ tablet Take 1 tablet (20 mEq total) by mouth 2 (two) times daily. 04/11/21   Davonna Belling, MD  saccharomyces boulardii (FLORASTOR) 250 MG capsule  Take 1 capsule (250 mg total) by mouth 2 (two) times daily. Any generic probiotic is okay 07/21/20   Rai, Vernelle Emerald, MD  Vitamin D, Ergocalciferol, (DRISDOL) 50000 units CAPS capsule Take 50,000 Units by mouth every Monday.  05/22/16   [provider]    Allergies    Food, Iron, Iron sucrose, Vancomycin, Venofer [ferric oxide], Soap, Morphine and related, Sulfa antibiotics, Sulfamethoxazole-trimethoprim, and Levaquin [levofloxacin]  Review of Systems   Review of Systems  Constitutional:  Negative for fever.  Gastrointestinal:  Positive for abdominal pain, diarrhea, nausea and vomiting.  Genitourinary:  Negative for dysuria.  Musculoskeletal:  Negative for back pain.  Neurological:  Positive for weakness and light-headedness.  All other systems reviewed and are negative.  Physical Exam Updated Vital Signs BP (!) 86/47 (BP Location: Left Arm)   Pulse 100   Temp 98.3 F (36.8 C) (Oral)   Resp 16   Ht 5' (1.524 m)   Wt 46.3 kg   SpO2 98%   BMI 19.92 kg/m   Physical Exam Vitals and nursing note reviewed.  Constitutional:      Appearance: She is well-developed.  HENT:     Head: Normocephalic and atraumatic.     Right Ear: External ear normal.     Left Ear: External ear normal.     Nose: Nose normal.  Eyes:     General:        Right eye: No discharge.        Left eye: No discharge.  Cardiovascular:     Rate and Rhythm: Regular rhythm. Tachycardia present.     Heart sounds: Normal heart sounds.  Pulmonary:     Effort: Pulmonary effort is normal.     Breath sounds: Normal breath sounds.  Abdominal:     Palpations: Abdomen is soft.     Tenderness: There is generalized abdominal tenderness.  Skin:    General: Skin is warm and dry.  Neurological:     Mental  Status: She is alert.  Psychiatric:        Mood and Affect: Mood is not anxious.    ED Results / Procedures / Treatments   Labs (all labs ordered are listed, but only abnormal results are displayed) Labs  Reviewed  COMPREHENSIVE METABOLIC PANEL - Abnormal; Notable for the following components:      Result Value   Sodium 126 (*)    Potassium 3.4 (*)    Chloride 97 (*)    CO2 9 (*)    Glucose, Bld 122 (*)    BUN 61 (*)    Creatinine, Ser 6.59 (*)    Calcium 7.0 (*)    Total Protein 9.3 (*)    GFR, Estimated 8 (*)    Anion gap 20 (*)    All other components within normal limits  LIPASE, BLOOD - Abnormal; Notable for the following components:   Lipase 114 (*)    All other components within normal limits  CBC WITH DIFFERENTIAL/PLATELET - Abnormal; Notable for the following components:   WBC 16.3 (*)    Platelets 427 (*)    Neutro Abs 13.8 (*)    All other components within normal limits  RESP PANEL BY RT-PCR (FLU A&B, COVID) ARPGX2  MAGNESIUM  BLOOD GAS, VENOUS  PREGNANCY, URINE  BLOOD GAS, ARTERIAL  I-STAT BETA HCG BLOOD, ED ([facility], [facility], [facility] ONLY)    EKG EKG Interpretation  Date/Time:  Sunday May 29 2021 15:37:59 EDT Ventricular Rate:  92 PR Interval:  160 QRS Duration: 84 QT Interval:  388 QTC Calculation: 479 R Axis:   80 Text Interpretation: Normal sinus rhythm Right atrial enlargement similar to May 2022 Confirmed by Sherwood Gambler [phone]) on 05/29/2021 3:40:53 PM  Radiology No results found.  Procedures Procedures   Medications Ordered in ED Medications  HYDROmorphone (DILAUDID) injection 0.5 mg (has no administration in time range)  lactated ringers bolus 1,000 mL (1,000 mLs Intravenous New Bag/Given 05/29/21 1544)  fentaNYL (SUBLIMAZE) injection 100 mcg (100 mcg Intravenous Given 05/29/21 1538)  ondansetron (ZOFRAN) injection 4 mg (4 mg Intravenous Given 05/29/21 1536)  metoCLOPramide (REGLAN) injection 10 mg (10 mg Intravenous Given 05/29/21 1535)  lactated ringers bolus 1,000 mL (1,000 mLs Intravenous New Bag/Given 05/29/21 1636)    ED Course  I have reviewed the triage vital signs and the nursing notes.  Pertinent labs & imaging results that were available during  my care of the patient were reviewed by me and considered in my medical decision making (see chart for details).    MDM Rules/Calculators/A&P                          Patient is noted to be mildly hypotensive in the 70s though this is not that far off compared to her baseline of 80s/90s.  She was given IV fluids and pain/nausea medicine.  This seems to be a recurrent issue for her.  While she does have generalized abdominal pain, not sure CT would be beneficial.  Seems like this is primarily a diarrhea/volume loss.  Her kidney function is unfortunately significantly worse from her baseline chronic kidney disease and so she will need admission with aggressive rehydration.  Second liter of fluid has been started.  No fevers to suggest infection.  No need for emergent dialysis. Discussed with hospitalist for admission. Final Clinical Impression(s) / ED Diagnoses Final diagnoses:  Acute kidney injury superimposed on chronic kidney disease ([place])    Rx / DC  Orders ED Discharge Orders     None        Sherwood Gambler, MD 05/29/21 [phone]

## 2021-05-30 ENCOUNTER — Encounter (HOSPITAL_COMMUNITY): Payer: Self-pay | Admitting: Internal Medicine

## 2021-05-30 LAB — CBC
HCT: 27.2 % — ABNORMAL LOW (ref 36.0–46.0)
Hemoglobin: 9.2 g/dL — ABNORMAL LOW (ref 12.0–15.0)
MCH: 27.8 pg (ref 26.0–34.0)
MCHC: 33.8 g/dL (ref 30.0–36.0)
MCV: 82.2 fL (ref 80.0–100.0)
Platelets: 286 10*3/uL (ref 150–400)
RBC: 3.31 MIL/uL — ABNORMAL LOW (ref 3.87–5.11)
RDW: 13.6 % (ref 11.5–15.5)
WBC: 9.2 10*3/uL (ref 4.0–10.5)
nRBC: 0 % (ref 0.0–0.2)

## 2021-05-30 LAB — BASIC METABOLIC PANEL
Anion gap: 11 (ref 5–15)
BUN: 50 mg/dL — ABNORMAL HIGH (ref 6–20)
CO2: 15 mmol/L — ABNORMAL LOW (ref 22–32)
Calcium: 6.7 mg/dL — ABNORMAL LOW (ref 8.9–10.3)
Chloride: 105 mmol/L (ref 98–111)
Creatinine, Ser: 4.3 mg/dL — ABNORMAL HIGH (ref 0.44–1.00)
GFR, Estimated: 13 mL/min — ABNORMAL LOW (ref >60)
Glucose, Bld: 95 mg/dL (ref 70–99)
Potassium: 2.3 mmol/L — CL (ref 3.5–5.1)
Sodium: 131 mmol/L — ABNORMAL LOW (ref 135–145)

## 2021-05-30 LAB — C DIFFICILE QUICK SCREEN W PCR REFLEX
C Diff antigen: NEGATIVE
C Diff interpretation: NOT DETECTED
C Diff toxin: NEGATIVE

## 2021-05-30 LAB — PROCALCITONIN: Procalcitonin: 0.14 ng/mL

## 2021-05-30 LAB — LACTIC ACID, PLASMA: Lactic Acid, Venous: 1.1 mmol/L (ref 0.5–1.9)

## 2021-05-30 MED ORDER — POTASSIUM CHLORIDE 20 MEQ PO PACK: 40.0000 meq | PACK | Freq: Every day | ORAL | Status: DC

## 2021-05-30 MED ORDER — POTASSIUM CHLORIDE 20 MEQ PO PACK
40.0000 meq | PACK | Freq: Once | ORAL | Status: AC
  Administered 2021-05-30: 40 meq via ORAL
  Filled 2021-05-30: qty 2

## 2021-05-30 MED ORDER — POTASSIUM CHLORIDE 10 MEQ/100ML IV SOLN
10.0000 meq | INTRAVENOUS | Status: AC
  Administered 2021-05-30 (×6): 10 meq via INTRAVENOUS
  Filled 2021-05-30 (×7): qty 100

## 2021-05-30 MED ORDER — LORAZEPAM 2 MG/ML IJ SOLN
0.5000 mg | Freq: Four times a day (QID) | INTRAMUSCULAR | Status: DC | PRN
  Administered 2021-05-30: 0.5 mg via INTRAVENOUS
  Filled 2021-05-30: qty 1

## 2021-05-30 MED ORDER — CHLORHEXIDINE GLUCONATE CLOTH 2 % EX PADS: 6.0000 | MEDICATED_PAD | Freq: Every day | CUTANEOUS | Status: DC

## 2021-05-30 NOTE — Plan of Care (Signed)
  Problem: Nutrition: Goal: Adequate nutrition will be maintained Outcome: Progressing   

## 2021-05-30 NOTE — Progress Notes (Signed)
PROGRESS NOTE    Cindy [name]  ERD:[id number] DOB: [birth year] DOA: 05/29/2021 PCP: Nicola Girt, DO     Brief Narrative:  Cindy [name] is a 30 y.o. female with medical history significant of colon cancer status post subtotal colectomy, ileostomy present, chronic kidney disease who presents to the hospital with chief complaint of nausea, vomiting, diarrhea, increased stool output from her ileostomy.  Also admitted to feeling weak and dizzy.  Patient denies any fevers, chest pain, cough.  On my examination, abdominal pain much improved after receiving pain medicines in the emergency department.  She states that normally, she changes her ileostomy bag 2-3 times daily, most recently it has increased to changing it 4-5 times daily. In the emergency department, patient was found to be hypotensive although not far off compared to her baseline of SBP 80s to 90s.  She received IV pain medicine, nausea medicine, IV fluid.  New events last 24 hours / Subjective: Patient reports that she is feeling much better, her ileostomy output has decreased, no further abdominal pain.  Reports being hungry, tolerating clear liquids and wants to advance her diet today.  Assessment & Plan:   Principal Problem:   AKI (acute kidney injury) ([place]) Active Problems:   Hypokalemia   High output ileostomy (HCC)   FAP (familial adenomatous polyposis)   Hypotension   CKD (chronic kidney disease) stage 4, GFR 15-29 ml/min (H[facility])   Dehydration   Prolonged QT interval   AKI on CKD stage 4 -Followed by Nephrology Dr. Devin Going in [place] creatinine ~1.8 in 2021 to 2.87 most recently in May 2022 -Renal ultrasound consistent with medical renal disease -Improving with IV fluids, continue   Hypotension -Baseline SBP 80s-90s -Continue to monitor on IVF    SIRS -Presented with HR 109, WBC 16.3 -Leukocytosis and tachycardia could be attributed to dehydration -UA negative -Lactic acid normal,  procalcitonin negative, leukocytosis resolved -Sepsis ruled out -Improved -Stool sample pending C. 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, s/p proctectomy with reconstruction of ileostomy in 2015 leading to short bowel syndrome    Hypokalemia -Replace   DVT prophylaxis:  heparin injection 5,000 Units Start: 05/29/21 2200  Code Status: Full code  Family Communication: None at bedside Disposition Plan:  Status is: Inpatient  Remains inpatient appropriate because:Persistent severe electrolyte disturbances, IV treatments appropriate due to intensity of illness or inability to take PO, and Inpatient level of care appropriate due to severity of illness  Dispo: The patient is from: Home              Anticipated d/c is to: Home              Patient currently is not medically stable to d/c.   Difficult to place patient No      Antimicrobials:  Anti-infectives (From admission, onward)    None        Objective: Vitals:   05/29/21 2141 05/29/21 2218 05/30/21 0211 05/30/21 0525  BP: (!) 74/47 (!) 74/39 (!) 83/48 (!) 82/52  Pulse: 87  76 78  Resp: 16  18 16   Temp: 98.3 F (36.8 C)  97.9 F (36.6 C) 98.1 F (36.7 C)  TempSrc: Oral  Oral Oral  SpO2: 99%  100% 100%  Weight:      Height:        Intake/Output Summary (Last 24 hours) at  05/30/2021 1300 Last data filed at 05/30/2021 0500 Gross per 24 hour  Intake 439.62 ml  Output 250 ml  Net 189.62 ml   Filed Weights   05/29/21 1540  Weight: 46.3 kg    Examination:  General exam: Appears calm and comfortable  Respiratory system: Clear to auscultation. Respiratory effort normal. No respiratory distress. No conversational dyspnea.  Cardiovascular system: S1 & S2 heard, RRR. No murmurs. No pedal edema. Gastrointestinal system: Abdomen is nondistended, soft and nontender. Normal bowel sounds heard. Central  nervous system: Alert and oriented. No focal neurological deficits. Speech clear.  Extremities: Symmetric in appearance  Skin: No rashes, lesions or ulcers on exposed skin  Psychiatry: Judgement and insight appear normal. Mood & affect appropriate.   Data Reviewed: I have personally reviewed following labs and imaging studies  CBC: Recent Labs  Lab 05/29/21 1527 05/30/21 0927  WBC 16.3* 9.2  NEUTROABS 13.8*  --   HGB 13.4 9.2*  HCT 39.9 27.2*  MCV 82.4 82.2  PLT 427* 381   Basic Metabolic Panel: Recent Labs  Lab 05/29/21 1527 05/30/21 0526  NA 126* 131*  K 3.4* 2.3*  CL 97* 105  CO2 9* 15*  GLUCOSE 122* 95  BUN 61* 50*  CREATININE 6.59* 4.30*  CALCIUM 7.0* 6.7*  MG 2.2  --    GFR: Estimated Creatinine Clearance: 13.5 mL/min (A) (by C-G formula based on SCr of 4.3 mg/dL (H)). Liver Function Tests: Recent Labs  Lab 05/29/21 1527  AST 30  ALT 13  ALKPHOS 121  BILITOT 0.8  PROT 9.3*  ALBUMIN 5.0   Recent Labs  Lab 05/29/21 1527  LIPASE 114*   No results for input(s): AMMONIA in the last 168 hours. Coagulation Profile: No results for input(s): INR, PROTIME in the last 168 hours. Cardiac Enzymes: No results for input(s): CKTOTAL, CKMB, CKMBINDEX, TROPONINI in the last 168 hours. BNP (last 3 results) No results for input(s): PROBNP in the last 8760 hours. HbA1C: No results for input(s): HGBA1C in the last 72 hours. CBG: No results for input(s): GLUCAP in the last 168 hours. Lipid Profile: No results for input(s): CHOL, HDL, LDLCALC, TRIG, CHOLHDL, LDLDIRECT in the last 72 hours. Thyroid Function Tests: No results for input(s): TSH, T4TOTAL, FREET4, T3FREE, THYROIDAB in the last 72 hours. Anemia Panel: No results for input(s): VITAMINB12, FOLATE, FERRITIN, TIBC, IRON, RETICCTPCT in the last 72 hours. Sepsis Labs: Recent Labs  Lab 05/29/21 1709 05/29/21 1921 05/30/21 0526  PROCALCITON 0.34  --  0.14  LATICACIDVEN  --  0.9 1.1    Recent Results  (from the past 240 hour(s))  Resp Panel by RT-PCR (Flu A&B, Covid) Nasopharyngeal Swab     Status: None   Collection Time: 05/29/21  4:37 PM   Specimen: Nasopharyngeal Swab; Nasopharyngeal(NP) swabs in vial transport medium  Result Value Ref Range Status   SARS Coronavirus 2 by RT PCR NEGATIVE NEGATIVE Final    Comment: (NOTE) SARS-CoV-2 target nucleic acids are NOT DETECTED.  The SARS-CoV-2 RNA is generally detectable in upper respiratory specimens during the acute phase of infection. The lowest concentration of SARS-CoV-2 viral copies this assay can detect is 138 copies/mL. A negative result does not preclude SARS-Cov-2 infection and should not be used as the sole basis for treatment or other patient management decisions. A negative result may occur with  improper specimen collection/handling, submission of specimen other than nasopharyngeal swab, presence of viral mutation(s) within the areas targeted by this assay, and inadequate number of viral copies(<138  copies/mL). A negative result must be combined with clinical observations, patient history, and epidemiological information. The expected result is Negative.  Fact Sheet for Patients:  EntrepreneurPulse.com.au  Fact Sheet for Healthcare Providers:  IncredibleEmployment.be  This test is no t yet approved or cleared by the [place] FDA and  has been authorized for detection and/or diagnosis of SARS-CoV-2 by FDA under an Emergency Use Authorization (EUA). This EUA will remain  in effect (meaning this test can be used) for the duration of the COVID-19 declaration under Section 564(b)(1) of the Act, 21 U.S.C.section 360bbb-3(b)(1), unless the authorization is terminated  or revoked sooner.       Influenza A by PCR NEGATIVE NEGATIVE Final   Influenza B by PCR NEGATIVE NEGATIVE Final    Comment: (NOTE) The Xpert Xpress SARS-CoV-2/FLU/RSV plus assay is intended as an aid in the  diagnosis of influenza from Nasopharyngeal swab specimens and should not be used as a sole basis for treatment. Nasal washings and aspirates are unacceptable for Xpert Xpress SARS-CoV-2/FLU/RSV testing.  Fact Sheet for Patients: EntrepreneurPulse.com.au  Fact Sheet for Healthcare Providers: IncredibleEmployment.be  This test is not yet approved or cleared by the [place] FDA and has been authorized for detection and/or diagnosis of SARS-CoV-2 by FDA under an Emergency Use Authorization (EUA). This EUA will remain in effect (meaning this test can be used) for the duration of the COVID-19 declaration under Section 564(b)(1) of the Act, 21 U.S.C. section 360bbb-3(b)(1), unless the authorization is terminated or revoked.  Performed at [facility], [place] [address], [place], [place] [address]       Radiology Studies: US RENAL  Result Date: 05/29/2021 CLINICAL DATA:  Acute kidney injury. EXAM: RENAL / URINARY TRACT ULTRASOUND COMPLETE COMPARISON:  Abdominopelvic CT 08/20/2020. FINDINGS: Right Kidney: Renal measurements: 11.6 x 3.8 x 4.4 cm = volume: 101 mL. Mild increased renal parenchymal echogenicity. There is chronic fullness of the renal collecting system. Previous right renal stone is not well seen by ultrasound. No focal lesion. Left Kidney: Renal measurements: 12.5 x 3.6 x 4.0 cm = volume: 93 mL. Mild increased renal parenchymal echogenicity. There is similar fullness of the left renal collecting system. No visualized stone or focal lesion. Bladder: Appears normal for degree of bladder distention. Other: None. IMPRESSION: 1. Chronic fullness of both renal collecting systems without frank hydronephrosis. Renal calculi on prior exams are not seen by ultrasound. 2. Mild increased renal parenchymal echogenicity suggesting chronic medical renal disease. Electronically Signed   By: Keith Rake M.D.   On: 05/29/2021 21:45       Scheduled Meds:  Chlorhexidine Gluconate Cloth  6 each Topical Daily   heparin  5,000 Units Subcutaneous Q8H   saccharomyces boulardii  250 mg Oral BID   Continuous Infusions:  lactated ringers 125 mL/hr at 05/30/21 1218   potassium chloride 10 mEq (05/30/21 1216)     LOS: 1 day      Time spent: 25 minutes   Dessa Phi, DO [facility] 05/30/2021, 1:00 PM   Available via Epic secure chat 7am-7pm After these hours, please refer to coverage provider listed on [email or web address]

## 2021-05-31 LAB — BASIC METABOLIC PANEL
Anion gap: 12 (ref 5–15)
Anion gap: 9 (ref 5–15)
BUN: 35 mg/dL — ABNORMAL HIGH (ref 6–20)
BUN: 32 mg/dL — ABNORMAL HIGH (ref 6–20)
CO2: 13 mmol/L — ABNORMAL LOW (ref 22–32)
CO2: 16 mmol/L — ABNORMAL LOW (ref 22–32)
Calcium: 7 mg/dL — ABNORMAL LOW (ref 8.9–10.3)
Calcium: 7.3 mg/dL — ABNORMAL LOW (ref 8.9–10.3)
Chloride: 110 mmol/L (ref 98–111)
Chloride: 112 mmol/L — ABNORMAL HIGH (ref 98–111)
Creatinine, Ser: 2.76 mg/dL — ABNORMAL HIGH (ref 0.44–1.00)
Creatinine, Ser: 2.51 mg/dL — ABNORMAL HIGH (ref 0.44–1.00)
GFR, Estimated: 23 mL/min — ABNORMAL LOW (ref >60)
GFR, Estimated: 25 mL/min — ABNORMAL LOW (ref >60)
Glucose, Bld: 99 mg/dL (ref 70–99)
Glucose, Bld: 117 mg/dL — ABNORMAL HIGH (ref 70–99)
Potassium: 2.6 mmol/L — CL (ref 3.5–5.1)
Potassium: 2.8 mmol/L — ABNORMAL LOW (ref 3.5–5.1)
Sodium: 135 mmol/L (ref 135–145)
Sodium: 137 mmol/L (ref 135–145)

## 2021-05-31 LAB — GASTROINTESTINAL PANEL BY PCR, STOOL (REPLACES STOOL CULTURE)

## 2021-05-31 LAB — CBC
HCT: 26.1 % — ABNORMAL LOW (ref 36.0–46.0)
Hemoglobin: 8.7 g/dL — ABNORMAL LOW (ref 12.0–15.0)
MCH: 27.4 pg (ref 26.0–34.0)
MCHC: 33.3 g/dL (ref 30.0–36.0)
MCV: 82.3 fL (ref 80.0–100.0)
Platelets: 256 10*3/uL (ref 150–400)
RBC: 3.17 MIL/uL — ABNORMAL LOW (ref 3.87–5.11)
RDW: 14 % (ref 11.5–15.5)
WBC: 8.3 10*3/uL (ref 4.0–10.5)
nRBC: 0 % (ref 0.0–0.2)

## 2021-05-31 LAB — MAGNESIUM
Magnesium: 1.3 mg/dL — ABNORMAL LOW (ref 1.7–2.4)
Magnesium: 2 mg/dL (ref 1.7–2.4)

## 2021-05-31 MED ORDER — POTASSIUM CHLORIDE 20 MEQ PO PACK
40.0000 meq | PACK | ORAL | Status: AC
  Administered 2021-05-31 (×2): 40 meq via ORAL
  Filled 2021-05-31 (×2): qty 2

## 2021-05-31 MED ORDER — POTASSIUM CHLORIDE 10 MEQ/100ML IV SOLN
10.0000 meq | INTRAVENOUS | Status: DC
  Administered 2021-05-31: 10 meq via INTRAVENOUS
  Filled 2021-05-31: qty 100

## 2021-05-31 MED ORDER — POTASSIUM CHLORIDE CRYS ER 20 MEQ PO TBCR: 20.0000 meq | EXTENDED_RELEASE_TABLET | Freq: Two times a day (BID) | ORAL | 0 refills | Status: DC

## 2021-05-31 MED ORDER — POTASSIUM CHLORIDE 20 MEQ PO PACK
40.0000 meq | PACK | Freq: Once | ORAL | Status: AC
  Administered 2021-05-31: 40 meq via ORAL
  Filled 2021-05-31: qty 2

## 2021-05-31 MED ORDER — MAGNESIUM SULFATE 2 GM/50ML IV SOLN
2.0000 g | Freq: Once | INTRAVENOUS | Status: AC
  Administered 2021-05-31: 2 g via INTRAVENOUS
  Filled 2021-05-31: qty 50

## 2021-05-31 MED ORDER — POTASSIUM CHLORIDE IN NACL 40-0.9 MEQ/L-% IV SOLN
INTRAVENOUS | Status: DC
  Filled 2021-05-31: qty 1000

## 2021-05-31 MED ORDER — POTASSIUM CHLORIDE 10 MEQ/100ML IV SOLN: 10.0000 meq | INTRAVENOUS | Status: DC

## 2021-05-31 NOTE — Progress Notes (Signed)
PROGRESS NOTE    Cindy [name]  IOM:[id number] DOB: 06-Oct-1991 DOA: 05/29/2021 PCP: Nicola Girt, DO     Brief Narrative:  Cindy [name] is a 30 y.o. female with medical history significant of colon cancer status post subtotal colectomy, ileostomy present, chronic kidney disease who presents to the hospital with chief complaint of nausea, vomiting, diarrhea, increased stool output from her ileostomy.  Also admitted to feeling weak and dizzy.  Patient denies any fevers, chest pain, cough.  On my examination, abdominal pain much improved after receiving pain medicines in the emergency department.  She states that normally, she changes her ileostomy bag 2-3 times daily, most recently it has increased to changing it 4-5 times daily. In the emergency department, patient was found to be hypotensive although not far off compared to her baseline of SBP 80s to 90s.  She received IV pain medicine, nausea medicine, IV fluid.  New events last 24 hours / Subjective: Doing well, tolerating regular diet.  No further abdominal pain, ileostomy output is back to baseline.  She stated that she had a work emergency, had to be discharged home this morning.  Told me that she would be fired from her job if she did not report to work within an hour.  With her permission, I called her boss Seth Bake, and per her boss, they recommended patient follow physician orders, did not have any concern for losing her job that was addressed to me.  Patient agreeable to stay in the hospital for IV electrolyte replacements.  Assessment & Plan:   Principal Problem:   AKI (acute kidney injury) ([place]) Active Problems:   Hypokalemia   High output ileostomy (HCC)   FAP (familial adenomatous polyposis)   Hypotension   CKD (chronic kidney disease) stage 4, GFR 15-29 ml/min (HCC)   Dehydration   Prolonged QT interval   AKI on CKD stage 4 -Followed by Nephrology Dr. Devin Going in [place] creatinine ~1.8 in 2021 to  2.87 most recently in May 2022 -Renal ultrasound consistent with medical renal disease -Improving with IV fluids, continue   Hypotension -Baseline SBP 80s-90s -Remains a stable   SIRS -Presented with HR 109, WBC 16.3 -Leukocytosis and tachycardia could be attributed to dehydration -UA negative -Lactic acid normal, procalcitonin negative, leukocytosis resolved -Sepsis ruled out -Improved -C. 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   Difficult to place patient No      Antimicrobials:  Anti-infectives (From admission, onward)    None        Objective: Vitals:   05/30/21 0525 05/30/21 1401 05/30/21 2208 05/31/21 0531  BP: (!) 82/52 Marland Kitchen)  90/52 (!) 83/44 (!) 83/52  Pulse: 78 91 93 85  Resp: 16 16 16 16   Temp: 98.1 F (36.7 C) 98.2 F (36.8 C) 98.1 F (36.7 C) 97.7 F (36.5 C)  TempSrc: Oral Oral Oral Oral  SpO2: 100%  100% 100%  Weight:      Height:        Intake/Output Summary (Last 24 hours) at 05/31/2021 1154 Last data filed at 05/31/2021 0600 Gross per 24 hour  Intake 4236.04 ml  Output 2600 ml  Net 1636.04 ml    Filed Weights   05/29/21 1540   Weight: 46.3 kg   Examination: General exam: Appears calm and comfortable  Respiratory system: Clear to auscultation. Respiratory effort normal. Cardiovascular system: S1 & S2 heard, RRR. No pedal edema. Gastrointestinal system: Abdomen is nondistended, soft and nontender. Normal bowel sounds heard. Central nervous system: Alert and oriented. Non focal exam. Speech clear  Extremities: Symmetric in appearance bilaterally  Skin: No rashes, lesions or ulcers on exposed skin  Psychiatry: Judgement and insight appear stable. Mood & affect appropriate.    Data Reviewed: I have personally reviewed following labs and imaging studies  CBC: Recent Labs  Lab 05/29/21 1527 05/30/21 0927 05/31/21 0421  WBC 16.3* 9.2 8.3  NEUTROABS 13.8*  --   --   HGB 13.4 9.2* 8.7*  HCT 39.9 27.2* 26.1*  MCV 82.4 82.2 82.3  PLT 427* 286 614    Basic Metabolic Panel: Recent Labs  Lab 05/29/21 1527 05/30/21 0526 05/31/21 0421  NA 126* 131* 135  K 3.4* 2.3* 2.6*  CL 97* 105 110  CO2 9* 15* 13*  GLUCOSE 122* 95 99  BUN 61* 50* 35*  CREATININE 6.59* 4.30* 2.76*  CALCIUM 7.0* 6.7* 7.0*  MG 2.2  --  1.3*    GFR: Estimated Creatinine Clearance: 21 mL/min (A) (by C-G formula based on SCr of 2.76 mg/dL (H)). Liver Function Tests: Recent Labs  Lab 05/29/21 1527  AST 30  ALT 13  ALKPHOS 121  BILITOT 0.8  PROT 9.3*  ALBUMIN 5.0    Recent Labs  Lab 05/29/21 1527  LIPASE 114*    No results for input(s): AMMONIA in the last 168 hours. Coagulation Profile: No results for input(s): INR, PROTIME in the last 168 hours. Cardiac Enzymes: No results for input(s): CKTOTAL, CKMB, CKMBINDEX, TROPONINI in the last 168 hours. BNP (last 3 results) No results for input(s): PROBNP in the last 8760 hours. HbA1C: No results for input(s): HGBA1C in the last 72 hours. CBG: No results for input(s): GLUCAP in the last 168 hours. Lipid Profile: No results for input(s): CHOL, HDL, LDLCALC, TRIG, CHOLHDL,  LDLDIRECT in the last 72 hours. Thyroid Function Tests: No results for input(s): TSH, T4TOTAL, FREET4, T3FREE, THYROIDAB in the last 72 hours. Anemia Panel: No results for input(s): VITAMINB12, FOLATE, FERRITIN, TIBC, IRON, RETICCTPCT in the last 72 hours. Sepsis Labs: Recent Labs  Lab 05/29/21 1709 05/29/21 1921 05/30/21 0526  PROCALCITON 0.34  --  0.14  LATICACIDVEN  --  0.9 1.1     Recent Results (from the past 240 hour(s))  Resp Panel by RT-PCR (Flu A&B, Covid) Nasopharyngeal Swab     Status: None   Collection Time: 05/29/21  4:37 PM   Specimen: Nasopharyngeal Swab; Nasopharyngeal(NP) swabs in vial transport medium  Result Value Ref Range Status   SARS Coronavirus 2 by RT PCR NEGATIVE NEGATIVE Final    Comment: (NOTE) SARS-CoV-2 target nucleic acids are NOT DETECTED.  The SARS-CoV-2 RNA  is generally detectable in upper respiratory specimens during the acute phase of infection. The lowest concentration of SARS-CoV-2 viral copies this assay can detect is 138 copies/mL. A negative result does not preclude SARS-Cov-2 infection and should not be used as the sole basis for treatment or other patient management decisions. A negative result may occur with  improper specimen collection/handling, submission of specimen other than nasopharyngeal swab, presence of viral mutation(s) within the areas targeted by this assay, and inadequate number of viral copies(<138 copies/mL). A negative result must be combined with clinical observations, patient history, and epidemiological information. The expected result is Negative.  Fact Sheet for Patients:  EntrepreneurPulse.com.au  Fact Sheet for Healthcare Providers:  IncredibleEmployment.be  This test is no t yet approved or cleared by the [place] FDA and  has been authorized for detection and/or diagnosis of SARS-CoV-2 by FDA under an Emergency Use Authorization (EUA). This EUA will remain  in  effect (meaning this test can be used) for the duration of the COVID-19 declaration under Section 564(b)(1) of the Act, 21 U.S.C.section 360bbb-3(b)(1), unless the authorization is terminated  or revoked sooner.       Influenza A by PCR NEGATIVE NEGATIVE Final   Influenza B by PCR NEGATIVE NEGATIVE Final    Comment: (NOTE) The Xpert Xpress SARS-CoV-2/FLU/RSV plus assay is intended as an aid in the diagnosis of influenza from Nasopharyngeal swab specimens and should not be used as a sole basis for treatment. Nasal washings and aspirates are unacceptable for Xpert Xpress SARS-CoV-2/FLU/RSV testing.  Fact Sheet for Patients: EntrepreneurPulse.com.au  Fact Sheet for Healthcare Providers: IncredibleEmployment.be  This test is not yet approved or cleared by the [place] FDA and has been authorized for detection and/or diagnosis of SARS-CoV-2 by FDA under an Emergency Use Authorization (EUA). This EUA will remain in effect (meaning this test can be used) for the duration of the COVID-19 declaration under Section 564(b)(1) of the Act, 21 U.S.C. section 360bbb-3(b)(1), unless the authorization is terminated or revoked.  Performed at [facility], [place] [address], [place], [place] [address]   C Difficile Quick Screen w PCR reflex     Status: None   Collection Time: 05/30/21  7:40 AM   Specimen: STOOL  Result Value Ref Range Status   C Diff antigen NEGATIVE NEGATIVE Final   C Diff toxin NEGATIVE NEGATIVE Final   C Diff interpretation No C. difficile detected.  Final    Comment: Performed at [facility], [place] [address], [place], [place] [address]       Radiology Studies: US RENAL  Result Date: 05/29/2021 CLINICAL DATA:  Acute kidney injury. EXAM: RENAL / URINARY TRACT ULTRASOUND COMPLETE COMPARISON:  Abdominopelvic CT 08/20/2020. FINDINGS: Right Kidney: Renal measurements: 11.6 x 3.8 x 4.4 cm = volume:  101 mL. Mild increased renal parenchymal echogenicity. There is chronic fullness of the renal collecting system. Previous right renal stone is not well seen by ultrasound. No focal lesion. Left Kidney: Renal measurements: 12.5 x 3.6 x 4.0 cm = volume: 93 mL. Mild increased renal parenchymal echogenicity. There is similar fullness of the left renal collecting system. No visualized stone or focal lesion. Bladder: Appears normal for degree of bladder distention. Other: None. IMPRESSION: 1. Chronic fullness of both renal collecting systems without frank hydronephrosis. Renal calculi on prior exams are not seen by ultrasound. 2. Mild increased renal parenchymal echogenicity suggesting chronic medical renal disease. Electronically Signed   By: Keith Rake M.D.   On: 05/29/2021  21:45      Scheduled Meds:  heparin  5,000 Units Subcutaneous Q8H   potassium chloride  40 mEq Oral Q4H   saccharomyces boulardii  250 mg Oral BID   Continuous Infusions:  0.9 % NaCl with KCl 40 mEq / L 75 mL/hr at 05/31/21 0918     LOS: 2 days      Time spent: 25 minutes   Dessa Phi, DO [facility] 05/31/2021, 11:54 AM   Available via Epic secure chat 7am-7pm After these hours, please refer to coverage provider listed on [email or web address]

## 2021-05-31 NOTE — Discharge Summary (Signed)
Physician Discharge Summary  Cindy [name] TDD:[id number] DOB: Jan 06, 1991 DOA: 05/29/2021  PCP: Nicola Girt, DO  Admit date: 05/29/2021 Discharge date: 05/31/2021  Admitted From: Home Disposition:  Home  Recommendations for Outpatient Follow-up:  Follow up with PCP in 1 week Follow up with Nephrology Please obtain BMP and Mg in 1 week   Discharge Condition: Stable CODE STATUS: Full  Diet recommendation: Regular   Brief/Interim Summary: Cindy [name] is a 30 y.o. female with medical history significant of colon cancer status post subtotal colectomy, ileostomy present, chronic kidney disease who presents to the hospital with chief complaint of nausea, vomiting, diarrhea, increased stool output from her ileostomy.  Also admitted to feeling weak and dizzy.  Patient denies any fevers, chest pain, cough.  On my examination, abdominal pain much improved after receiving pain medicines in the emergency department.  She states that normally, she changes her ileostomy bag 2-3 times daily, most recently it has increased to changing it 4-5 times daily. In the emergency department, patient was found to be hypotensive although not far off compared to her baseline of SBP 80s to 90s.  She received IV pain medicine, nausea medicine, IV fluid.  Her symptoms continued to improve, N/V/abdominal pain and high ileostomy output improved.  Patient's magnesium and potassium were replaced.  Patient continued to really want to leave the hospital and continue supplementation by mouth at home.  Discharge Diagnoses:  Principal Problem:   AKI (acute kidney injury) ([place]) Active Problems:   Hypokalemia   High output ileostomy (HCC)   FAP (familial adenomatous polyposis)   Hypotension   CKD (chronic kidney disease) stage 4, GFR 15-29 ml/min (HCC)   Dehydration   Prolonged QT interval   AKI on CKD stage 4 -Followed by Nephrology Dr. Devin Going in [place] creatinine ~1.8 in 2021 to 2.87 most  recently in May 2022 -Renal ultrasound consistent with medical renal disease -Improved   Hypotension -Baseline SBP 80s-90s -Remained a stable   SIRS -Presented with HR 109, WBC 16.3 -Leukocytosis and tachycardia could be attributed to dehydration -UA negative -Lactic acid normal, procalcitonin negative, leukocytosis resolved -Sepsis ruled out -Improved -C. 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, s/p proctectomy with reconstruction of ileostomy in 2015 leading to short bowel syndrome    Hypokalemia -Replace   Hypomagnesia -Replace  Discharge Instructions  Discharge Instructions     Call MD for:  difficulty breathing, headache or visual disturbances   Complete by: As directed    Call MD for:  extreme fatigue   Complete by: As directed    Call MD for:  persistant dizziness or light-headedness   Complete by: As directed    Call MD for:  persistant nausea and vomiting   Complete by: As directed    Call MD for:  severe uncontrolled pain   Complete by: As directed    Call MD for:  temperature >100.4   Complete by: As directed    Discharge instructions   Complete by: As directed    You were cared for by a hospitalist during your hospital stay. If you have any questions about your discharge medications or the care you received while you were in the hospital after you are discharged, you can call the unit and ask to speak with the hospitalist on call if the hospitalist that took care of you is  not available. Once you are discharged, your primary care physician will handle any further medical issues. Please note that NO REFILLS for any discharge medications will be authorized once you are discharged, as it is imperative that you return to your primary care physician (or establish a relationship with a primary care physician if you do not have one) for  your aftercare needs so that they can reassess your need for medications and monitor your lab values.   Increase activity slowly   Complete by: As directed       Allergies as of 05/31/2021       Reactions   Food Anaphylaxis, Other (See Comments)   Orange juice   Iron Palpitations   Heart stops   Iron Sucrose Anaphylaxis   She had anaphylaxis to venofier but was given ferrumoxytol in allergy clinic by Dr. Antonietta Breach and tolerated well in 2020   Vancomycin Anaphylaxis   Venofer [ferric Oxide] Anaphylaxis   Soap Hives, Itching, Other (See Comments)   Dove soap   Morphine And Related Swelling   When given IV, swelling around site   Sulfa Antibiotics Other (See Comments)   dehydration   Sulfamethoxazole-trimethoprim Nausea And Vomiting   Patient developed AKI that appears to have been from bactrim. She might be able to take again if watched closely and it is absolutely needed - ie there was no anaphylaxis or allergic rash   Levaquin [levofloxacin] Hives        Medication List     STOP taking these medications    benzonatate 200 MG capsule Commonly known as: TESSALON   guaiFENesin-codeine 100-10 MG/5ML syrup   HYDROcodone-acetaminophen 5-325 MG tablet Commonly known as: NORCO/VICODIN   POTASSIUM CITRATE PO       TAKE these medications    CALCIUM PO Take 1 Dose by mouth daily.   cyanocobalamin 1000 MCG/ML injection Commonly known as: (VITAMIN B-12) Inject 1,000 mcg into the muscle every 30 (thirty) days.   MAGNESIUM PO Take 1 Dose by mouth daily.   ondansetron 4 MG disintegrating tablet Commonly known as: ZOFRAN-ODT Take 1 tablet (4 mg total) by mouth every 8 (eight) hours as needed for nausea or vomiting.   potassium chloride SA 20 MEQ tablet Commonly known as: KLOR-CON Take 1 tablet (20 mEq total) by mouth 2 (two) times daily for 3 days.   saccharomyces boulardii 250 MG capsule Commonly known as: FLORASTOR Take 1 capsule (250 mg total) by mouth 2 (two)  times daily. Any generic probiotic is okay   VITAMIN D3 PO Take 1 Dose by mouth daily.        Follow-up Information     Nicola Girt, DO. Schedule an appointment as soon as possible for a visit in 1 week(s).   Specialty: Internal Medicine Why: Follow up for repeat lab work [facility] and [facility] Contact information: [address] [place] [address] [phone]         Claudette Stapler, MD Follow up.   Specialty: Internal Medicine Contact information: [address] [place] [address] [phone]                Allergies  Allergen Reactions   Food Anaphylaxis and Other (See Comments)    Orange juice   Iron Palpitations    Heart stops   Iron Sucrose Anaphylaxis    She had anaphylaxis to venofier but was given ferrumoxytol in allergy clinic by Dr. Antonietta Breach and tolerated well in 2020   Vancomycin Anaphylaxis  Venofer [Ferric Oxide] Anaphylaxis   Soap Hives, Itching and Other (See Comments)    Dove soap   Morphine And Related Swelling    When given IV, swelling around site   Sulfa Antibiotics Other (See Comments)    dehydration   Sulfamethoxazole-Trimethoprim Nausea And Vomiting    Patient developed AKI that appears to have been from bactrim. She might be able to take again if watched closely and it is absolutely needed - ie there was no anaphylaxis or allergic rash   Levaquin [Levofloxacin] Hives    Consultations: None    Procedures/Studies: US RENAL  Result Date: 05/29/2021 CLINICAL DATA:  Acute kidney injury. EXAM: RENAL / URINARY TRACT ULTRASOUND COMPLETE COMPARISON:  Abdominopelvic CT 08/20/2020. FINDINGS: Right Kidney: Renal measurements: 11.6 x 3.8 x 4.4 cm = volume: 101 mL. Mild increased renal parenchymal echogenicity. There is chronic fullness of the renal collecting system. Previous right renal stone is not well seen by ultrasound. No focal lesion. Left Kidney: Renal measurements: 12.5 x 3.6 x 4.0 cm = volume: 93  mL. Mild increased renal parenchymal echogenicity. There is similar fullness of the left renal collecting system. No visualized stone or focal lesion. Bladder: Appears normal for degree of bladder distention. Other: None. IMPRESSION: 1. Chronic fullness of both renal collecting systems without frank hydronephrosis. Renal calculi on prior exams are not seen by ultrasound. 2. Mild increased renal parenchymal echogenicity suggesting chronic medical renal disease. Electronically Signed   By: Keith Rake M.D.   On: 05/29/2021 21:45       Discharge Exam: Vitals:   05/31/21 0531 05/31/21 1315  BP: (!) 83/52 (!) 83/48  Pulse: 85 98  Resp: 16 19  Temp: 97.7 F (36.5 C) 98.6 F (37 C)  SpO2: 100% 100%    General: Pt is alert, awake, not in acute distress Cardiovascular: RRR, S1/S2 +, no edema Respiratory: CTA bilaterally, no wheezing, no rhonchi, no respiratory distress, no conversational dyspnea  Abdominal: Soft, NT, ND, bowel sounds + Extremities: no edema, no cyanosis Psych: Normal mood and affect, stable judgement and insight     The results of significant diagnostics from this hospitalization (including imaging, microbiology, ancillary and laboratory) are listed below for reference.     Microbiology: Recent Results (from the past 240 hour(s))  Resp Panel by RT-PCR (Flu A&B, Covid) Nasopharyngeal Swab     Status: None   Collection Time: 05/29/21  4:37 PM   Specimen: Nasopharyngeal Swab; Nasopharyngeal(NP) swabs in vial transport medium  Result Value Ref Range Status   SARS Coronavirus 2 by RT PCR NEGATIVE NEGATIVE Final    Comment: (NOTE) SARS-CoV-2 target nucleic acids are NOT DETECTED.  The SARS-CoV-2 RNA is generally detectable in upper respiratory specimens during the acute phase of infection. The lowest concentration of SARS-CoV-2 viral copies this assay can detect is 138 copies/mL. A negative result does not preclude SARS-Cov-2 infection and should not be used as the  sole basis for treatment or other patient management decisions. A negative result may occur with  improper specimen collection/handling, submission of specimen other than nasopharyngeal swab, presence of viral mutation(s) within the areas targeted by this assay, and inadequate number of viral copies(<138 copies/mL). A negative result must be combined with clinical observations, patient history, and epidemiological information. The expected result is Negative.  Fact Sheet for Patients:  EntrepreneurPulse.com.au  Fact Sheet for Healthcare Providers:  IncredibleEmployment.be  This test is no t yet approved or cleared by the [place] and  has been authorized  for detection and/or diagnosis of SARS-CoV-2 by FDA under an Emergency Use Authorization (EUA). This EUA will remain  in effect (meaning this test can be used) for the duration of the COVID-19 declaration under Section 564(b)(1) of the Act, 21 U.S.C.section 360bbb-3(b)(1), unless the authorization is terminated  or revoked sooner.       Influenza A by PCR NEGATIVE NEGATIVE Final   Influenza B by PCR NEGATIVE NEGATIVE Final    Comment: (NOTE) The Xpert Xpress SARS-CoV-2/FLU/RSV plus assay is intended as an aid in the diagnosis of influenza from Nasopharyngeal swab specimens and should not be used as a sole basis for treatment. Nasal washings and aspirates are unacceptable for Xpert Xpress SARS-CoV-2/FLU/RSV testing.  Fact Sheet for Patients: EntrepreneurPulse.com.au  Fact Sheet for Healthcare Providers: IncredibleEmployment.be  This test is not yet approved or cleared by the [place] FDA and has been authorized for detection and/or diagnosis of SARS-CoV-2 by FDA under an Emergency Use Authorization (EUA). This EUA will remain in effect (meaning this test can be used) for the duration of the COVID-19 declaration under Section 564(b)(1) of the  Act, 21 U.S.C. section 360bbb-3(b)(1), unless the authorization is terminated or revoked.  Performed at [facility], [place] [address], [place], [place] [address]   C Difficile Quick Screen w PCR reflex     Status: None   Collection Time: 05/30/21  7:40 AM   Specimen: STOOL  Result Value Ref Range Status   C Diff antigen NEGATIVE NEGATIVE Final   C Diff toxin NEGATIVE NEGATIVE Final   C Diff interpretation No C. difficile detected.  Final    Comment: Performed at [facility], [place] [address], [place], [place] [address]  Gastrointestinal Panel by PCR , Stool     Status: None   Collection Time: 05/30/21  7:40 AM   Specimen: Ileostomy; Stool  Result Value Ref Range Status   Campylobacter species NOT DETECTED NOT DETECTED Final   Plesimonas shigelloides NOT DETECTED NOT DETECTED Final   Salmonella species NOT DETECTED NOT DETECTED Final   Yersinia enterocolitica NOT DETECTED NOT DETECTED Final   Vibrio species NOT DETECTED NOT DETECTED Final   Vibrio cholerae NOT DETECTED NOT DETECTED Final   Enteroaggregative E coli (EAEC) NOT DETECTED NOT DETECTED Final   Enteropathogenic E coli (EPEC) NOT DETECTED NOT DETECTED Final   Enterotoxigenic E coli (ETEC) NOT DETECTED NOT DETECTED Final   Shiga like toxin producing E coli (STEC) NOT DETECTED NOT DETECTED Final   Shigella/Enteroinvasive E coli (EIEC) NOT DETECTED NOT DETECTED Final   Cryptosporidium NOT DETECTED NOT DETECTED Final   Cyclospora cayetanensis NOT DETECTED NOT DETECTED Final   Entamoeba histolytica NOT DETECTED NOT DETECTED Final   Giardia lamblia NOT DETECTED NOT DETECTED Final   Adenovirus F40/41 NOT DETECTED NOT DETECTED Final   Astrovirus NOT DETECTED NOT DETECTED Final   Norovirus GI/GII NOT DETECTED NOT DETECTED Final   Rotavirus A NOT DETECTED NOT DETECTED Final   Sapovirus (I, II, IV, and V) NOT DETECTED NOT DETECTED Final    Comment: Performed at [facility], [address] [place], [place] [address]     Labs: BNP (last 3 results) No results for input(s): BNP in the last 8760 hours. Basic Metabolic Panel: Recent Labs  Lab 05/29/21 1527 05/30/21 0526 05/31/21 0421 05/31/21 1435  NA 126* 131* 135 137  K 3.4* 2.3* 2.6* 2.8*  CL 97* 105 110 112*  CO2 9* 15* 13* 16*  GLUCOSE 122* 95  99 117*  BUN 61* 50* 35* 32*  CREATININE 6.59* 4.30* 2.76* 2.51*  CALCIUM 7.0* 6.7* 7.0* 7.3*  MG 2.2  --  1.3* 2.0   Liver Function Tests: Recent Labs  Lab 05/29/21 1527  AST 30  ALT 13  ALKPHOS 121  BILITOT 0.8  PROT 9.3*  ALBUMIN 5.0   Recent Labs  Lab 05/29/21 1527  LIPASE 114*   No results for input(s): AMMONIA in the last 168 hours. CBC: Recent Labs  Lab 05/29/21 1527 05/30/21 0927 05/31/21 0421  WBC 16.3* 9.2 8.3  NEUTROABS 13.8*  --   --   HGB 13.4 9.2* 8.7*  HCT 39.9 27.2* 26.1*  MCV 82.4 82.2 82.3  PLT 427* 286 256   Cardiac Enzymes: No results for input(s): CKTOTAL, CKMB, CKMBINDEX, TROPONINI in the last 168 hours. BNP: Invalid input(s): POCBNP CBG: No results for input(s): GLUCAP in the last 168 hours. D-Dimer No results for input(s): DDIMER in the last 72 hours. Hgb A1c No results for input(s): HGBA1C in the last 72 hours. Lipid Profile No results for input(s): CHOL, HDL, LDLCALC, TRIG, CHOLHDL, LDLDIRECT in the last 72 hours. Thyroid function studies No results for input(s): TSH, T4TOTAL, T3FREE, THYROIDAB in the last 72 hours.  Invalid input(s): FREET3 Anemia work up No results for input(s): VITAMINB12, FOLATE, FERRITIN, TIBC, IRON, RETICCTPCT in the last 72 hours. Urinalysis    Component Value Date/Time   COLORURINE YELLOW 04/11/2021 1400   APPEARANCEUR HAZY (A) 04/11/2021 1400   LABSPEC 1.011 04/11/2021 1400   PHURINE 6.0 04/11/2021 1400   GLUCOSEU NEGATIVE 04/11/2021 1400   HGBUR NEGATIVE 04/11/2021 1400   BILIRUBINUR NEGATIVE 04/11/2021 1400   KETONESUR NEGATIVE 04/11/2021 1400   PROTEINUR NEGATIVE  04/11/2021 1400   UROBILINOGEN 0.2 07/29/2015 0320   NITRITE NEGATIVE 04/11/2021 1400   LEUKOCYTESUR TRACE (A) 04/11/2021 1400   Sepsis Labs Invalid input(s): PROCALCITONIN,  WBC,  LACTICIDVEN Microbiology Recent Results (from the past 240 hour(s))  Resp Panel by RT-PCR (Flu A&B, Covid) Nasopharyngeal Swab     Status: None   Collection Time: 05/29/21  4:37 PM   Specimen: Nasopharyngeal Swab; Nasopharyngeal(NP) swabs in vial transport medium  Result Value Ref Range Status   SARS Coronavirus 2 by RT PCR NEGATIVE NEGATIVE Final    Comment: (NOTE) SARS-CoV-2 target nucleic acids are NOT DETECTED.  The SARS-CoV-2 RNA is generally detectable in upper respiratory specimens during the acute phase of infection. The lowest concentration of SARS-CoV-2 viral copies this assay can detect is 138 copies/mL. A negative result does not preclude SARS-Cov-2 infection and should not be used as the sole basis for treatment or other patient management decisions. A negative result may occur with  improper specimen collection/handling, submission of specimen other than nasopharyngeal swab, presence of viral mutation(s) within the areas targeted by this assay, and inadequate number of viral copies(<138 copies/mL). A negative result must be combined with clinical observations, patient history, and epidemiological information. The expected result is Negative.  Fact Sheet for Patients:  EntrepreneurPulse.com.au  Fact Sheet for Healthcare Providers:  IncredibleEmployment.be  This test is no t yet approved or cleared by the [place] FDA and  has been authorized for detection and/or diagnosis of SARS-CoV-2 by FDA under an Emergency Use Authorization (EUA). This EUA will remain  in effect (meaning this test can be used) for the duration of the COVID-19 declaration under Section 564(b)(1) of the Act, 21 U.S.C.section 360bbb-3(b)(1), unless the authorization is  terminated  or revoked sooner.  Influenza A by PCR NEGATIVE NEGATIVE Final   Influenza B by PCR NEGATIVE NEGATIVE Final    Comment: (NOTE) The Xpert Xpress SARS-CoV-2/FLU/RSV plus assay is intended as an aid in the diagnosis of influenza from Nasopharyngeal swab specimens and should not be used as a sole basis for treatment. Nasal washings and aspirates are unacceptable for Xpert Xpress SARS-CoV-2/FLU/RSV testing.  Fact Sheet for Patients: EntrepreneurPulse.com.au  Fact Sheet for Healthcare Providers: IncredibleEmployment.be  This test is not yet approved or cleared by the [place] FDA and has been authorized for detection and/or diagnosis of SARS-CoV-2 by FDA under an Emergency Use Authorization (EUA). This EUA will remain in effect (meaning this test can be used) for the duration of the COVID-19 declaration under Section 564(b)(1) of the Act, 21 U.S.C. section 360bbb-3(b)(1), unless the authorization is terminated or revoked.  Performed at [facility], [place] [address] [place], [place] [address]   C Difficile Quick Screen w PCR reflex     Status: None   Collection Time: 05/30/21  7:40 AM   Specimen: STOOL  Result Value Ref Range Status   C Diff antigen NEGATIVE NEGATIVE Final   C Diff toxin NEGATIVE NEGATIVE Final   C Diff interpretation No C. difficile detected.  Final    Comment: Performed at [facility], [place] [address] [place], [place] [address]  Gastrointestinal Panel by PCR , Stool     Status: None   Collection Time: 05/30/21  7:40 AM   Specimen: Ileostomy; Stool  Result Value Ref Range Status   Campylobacter species NOT DETECTED NOT DETECTED Final   Plesimonas shigelloides NOT DETECTED NOT DETECTED Final   Salmonella species NOT DETECTED NOT DETECTED Final   Yersinia enterocolitica NOT DETECTED NOT DETECTED Final   Vibrio species NOT DETECTED NOT DETECTED Final   Vibrio  cholerae NOT DETECTED NOT DETECTED Final   Enteroaggregative E coli (EAEC) NOT DETECTED NOT DETECTED Final   Enteropathogenic E coli (EPEC) NOT DETECTED NOT DETECTED Final   Enterotoxigenic E coli (ETEC) NOT DETECTED NOT DETECTED Final   Shiga like toxin producing E coli (STEC) NOT DETECTED NOT DETECTED Final   Shigella/Enteroinvasive E coli (EIEC) NOT DETECTED NOT DETECTED Final   Cryptosporidium NOT DETECTED NOT DETECTED Final   Cyclospora cayetanensis NOT DETECTED NOT DETECTED Final   Entamoeba histolytica NOT DETECTED NOT DETECTED Final   Giardia lamblia NOT DETECTED NOT DETECTED Final   Adenovirus F40/41 NOT DETECTED NOT DETECTED Final   Astrovirus NOT DETECTED NOT DETECTED Final   Norovirus GI/GII NOT DETECTED NOT DETECTED Final   Rotavirus A NOT DETECTED NOT DETECTED Final   Sapovirus (I, II, IV, and V) NOT DETECTED NOT DETECTED Final    Comment: Performed at [facility], [place] [place], [place] [address]     Patient was seen and examined on the day of discharge and was found to be in stable condition. Time coordinating discharge: 35 minutes including assessment and coordination of care, as well as examination of the patient.   SIGNED:  Dessa Phi, DO [facility] 05/31/2021, 4:10 PM

## 2021-07-20 ENCOUNTER — Other Ambulatory Visit: Payer: Self-pay

## 2021-07-20 ENCOUNTER — Encounter: Payer: Self-pay | Admitting: Emergency Medicine

## 2021-07-20 DIAGNOSIS — K2970 Gastritis, unspecified, without bleeding: Secondary | ICD-10-CM | POA: Insufficient documentation

## 2021-07-20 DIAGNOSIS — N184 Chronic kidney disease, stage 4 (severe): Secondary | ICD-10-CM | POA: Insufficient documentation

## 2021-07-20 DIAGNOSIS — Z85038 Personal history of other malignant neoplasm of large intestine: Secondary | ICD-10-CM | POA: Diagnosis not present

## 2021-07-20 DIAGNOSIS — R109 Unspecified abdominal pain: Secondary | ICD-10-CM | POA: Diagnosis present

## 2021-07-20 LAB — CBC
HCT: 35.2 % — ABNORMAL LOW (ref 36.0–46.0)
Hemoglobin: 12.1 g/dL (ref 12.0–15.0)
MCH: 29.9 pg (ref 26.0–34.0)
MCHC: 34.4 g/dL (ref 30.0–36.0)
MCV: 86.9 fL (ref 80.0–100.0)
Platelets: 314 10*3/uL (ref 150–400)
RBC: 4.05 MIL/uL (ref 3.87–5.11)
RDW: 13.9 % (ref 11.5–15.5)
WBC: 12.2 10*3/uL — ABNORMAL HIGH (ref 4.0–10.5)
nRBC: 0 % (ref 0.0–0.2)

## 2021-07-20 LAB — URINALYSIS, COMPLETE (UACMP) WITH MICROSCOPIC
Bilirubin Urine: NEGATIVE
Glucose, UA: NEGATIVE mg/dL
Hgb urine dipstick: NEGATIVE
Ketones, ur: NEGATIVE mg/dL
Leukocytes,Ua: NEGATIVE
Nitrite: NEGATIVE
Protein, ur: NEGATIVE mg/dL
Specific Gravity, Urine: 1.012 (ref 1.005–1.030)
pH: 5 (ref 5.0–8.0)

## 2021-07-20 LAB — COMPREHENSIVE METABOLIC PANEL
ALT: 12 U/L (ref 0–44)
AST: 20 U/L (ref 15–41)
Albumin: 4.5 g/dL (ref 3.5–5.0)
Alkaline Phosphatase: 92 U/L (ref 38–126)
Anion gap: 10 (ref 5–15)
BUN: 29 mg/dL — ABNORMAL HIGH (ref 6–20)
CO2: 18 mmol/L — ABNORMAL LOW (ref 22–32)
Calcium: 8 mg/dL — ABNORMAL LOW (ref 8.9–10.3)
Chloride: 103 mmol/L (ref 98–111)
Creatinine, Ser: 2.59 mg/dL — ABNORMAL HIGH (ref 0.44–1.00)
GFR, Estimated: 25 mL/min — ABNORMAL LOW (ref >60)
Glucose, Bld: 101 mg/dL — ABNORMAL HIGH (ref 70–99)
Potassium: 2.9 mmol/L — ABNORMAL LOW (ref 3.5–5.1)
Sodium: 131 mmol/L — ABNORMAL LOW (ref 135–145)
Total Bilirubin: 0.5 mg/dL (ref 0.3–1.2)
Total Protein: 8.2 g/dL — ABNORMAL HIGH (ref 6.5–8.1)

## 2021-07-20 LAB — POC URINE PREG, ED: Preg Test, Ur: NEGATIVE

## 2021-07-20 LAB — LIPASE, BLOOD: Lipase: 58 U/L — ABNORMAL HIGH (ref 11–51)

## 2021-07-20 NOTE — ED Triage Notes (Signed)
Pt in via POV, states, "I think I'm dehydrated. I haven't urinated in the last 3 hours, and Im having abdominal pain."  Reports symptoms just began.  Ambulatory to triage, NAD noted at this time.

## 2021-07-21 ENCOUNTER — Emergency Department: Payer: PRIVATE HEALTH INSURANCE

## 2021-07-21 ENCOUNTER — Emergency Department
Admission: EM | Admit: 2021-07-21 | Discharge: 2021-07-21 | Disposition: A | Discharge status: Home / Self Care | Payer: PRIVATE HEALTH INSURANCE | Attending: Emergency Medicine | Admitting: Emergency Medicine

## 2021-07-21 DIAGNOSIS — K2970 Gastritis, unspecified, without bleeding: Secondary | ICD-10-CM

## 2021-07-21 LAB — PROCALCITONIN: Procalcitonin: 0.11 ng/mL

## 2021-07-21 LAB — MAGNESIUM: Magnesium: 1.8 mg/dL (ref 1.7–2.4)

## 2021-07-21 LAB — LACTIC ACID, PLASMA: Lactic Acid, Venous: 1.2 mmol/L (ref 0.5–1.9)

## 2021-07-21 MED ORDER — POTASSIUM CHLORIDE CRYS ER 20 MEQ PO TBCR
40.0000 meq | EXTENDED_RELEASE_TABLET | Freq: Once | ORAL | Status: AC
  Administered 2021-07-21: 40 meq via ORAL
  Filled 2021-07-21: qty 2

## 2021-07-21 MED ORDER — POTASSIUM CHLORIDE 10 MEQ/100ML IV SOLN
10.0000 meq | INTRAVENOUS | Status: AC
  Administered 2021-07-21 (×2): 10 meq via INTRAVENOUS
  Filled 2021-07-21 (×2): qty 100

## 2021-07-21 MED ORDER — PANTOPRAZOLE SODIUM 40 MG IV SOLR
40.0000 mg | Freq: Once | INTRAVENOUS | Status: AC
  Administered 2021-07-21: 40 mg via INTRAVENOUS
  Filled 2021-07-21: qty 40

## 2021-07-21 MED ORDER — SODIUM CHLORIDE 0.9 % IV BOLUS (SEPSIS)
1000.0000 mL | Freq: Once | INTRAVENOUS | Status: AC
  Administered 2021-07-21: 1000 mL via INTRAVENOUS

## 2021-07-21 MED ORDER — ONDANSETRON HCL 4 MG/2ML IJ SOLN
4.0000 mg | Freq: Once | INTRAMUSCULAR | Status: AC
  Administered 2021-07-21: 4 mg via INTRAVENOUS
  Filled 2021-07-21: qty 2

## 2021-07-21 MED ORDER — PANTOPRAZOLE SODIUM 40 MG PO TBEC: 40.0000 mg | DELAYED_RELEASE_TABLET | Freq: Every day | ORAL | 1 refills | Status: DC

## 2021-07-21 MED ORDER — ONDANSETRON 4 MG PO TBDP: 4.0000 mg | ORAL_TABLET | Freq: Four times a day (QID) | ORAL | 0 refills | Status: DC | PRN

## 2021-07-21 MED ORDER — FENTANYL CITRATE PF 50 MCG/ML IJ SOSY
50.0000 ug | PREFILLED_SYRINGE | Freq: Once | INTRAMUSCULAR | Status: AC
Start: 2021-07-21 — End: 2021-07-21
  Administered 2021-07-21: 50 ug via INTRAVENOUS
  Filled 2021-07-21: qty 1

## 2021-07-21 MED ORDER — IOHEXOL 9 MG/ML PO SOLN
500.0000 mL | ORAL | Status: AC
  Administered 2021-07-21 (×2): 500 mL via ORAL

## 2021-07-21 NOTE — ED Notes (Signed)
Pt states she is unable to drink contrast, EDP made aware and in to see pt

## 2021-07-21 NOTE — Discharge Instructions (Addendum)
Your lab work today was reassuring.  Your CT scan showed inflammation of your stomach consistent with gastritis but we could not rule out an underlying mass.  I recommend close follow-up with your gastroenterologist at [facility].  Please call this morning to schedule close outpatient follow-up as you will need an endoscopy.

## 2021-07-21 NOTE — ED Notes (Signed)
Patient ambulated to and from restroom, steady gait

## 2021-07-21 NOTE — ED Notes (Signed)
Called CT and informed them pt finished drinking contrast at [facility]

## 2021-07-21 NOTE — ED Provider Notes (Signed)
[facility] Emergency Department Provider Note  ____________________________________________   Event Date/Time   First MD Initiated Contact with Patient 07/21/21 [phone]     (approximate)  I have reviewed the triage vital signs and the nursing notes.   HISTORY  Chief Complaint Abdominal Pain and Nausea    HPI Cindy [name] is a 30 y.o. female history of colon cancer status post subtotal colectomy, proctectomy and ileostomy, chronic kidney disease, previous history of urosepsis in 2018 who presents to the emergency department complaints of diffuse, severe abdominal pain, nausea without vomiting for the past day.  States she thinks she is dehydrated because she has not been able to eat or drink much.  No fevers, chest pain, shortness of breath, cough, dysuria, hematuria, vaginal bleeding or discharge.  No known sick contacts or recent travel.  States that she has seen a slight increase in output from her ileostomy.        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 appy 12-23-2007 (emergent ruptured appendix)   FAP (familial adenomatous polyposis) 09/30/2014   Heart murmur    History of acute renal failure    2015;  2016   History of fetal demise, not currently pregnant    03-22-2013  stillborn at 45 wks   History of kidney stones    History of sepsis    admitted 12-03-2016 (post-op day 2 w/ bilateral ureteral stents and stone manipluation) discharged 12-06-2016  for urosepsis   Hypomagnesemia    Ileostomy in place [facility])    Iron deficiency anemia     Nephrolithiasis    BILATERAL    Normocytic anemia    Renal cyst, left    PER CT 11-12-2016   Urgency of urination     Patient Active Problem List   Diagnosis Date Noted   Severe sepsis ([place]) 08/16/2020   Pneumonia 07/18/2020   Hyponatremia 07/18/2020   Prolonged QT interval 07/18/2020   Hypomagnesemia 07/18/2020   Hypophosphatemia 07/18/2020   Lower abdominal pain 07/13/2018   Elevated lipase 07/13/2018   AKI (acute kidney injury) ([place]) 09/02/2017   Dehydration 06/07/2017   Sepsis, unspecified organism ([place]) 12/03/2016   CKD (chronic kidney disease) stage 4, GFR 15-29 ml/min (HCC) 12/03/2016   Normocytic anemia 05/02/2015   Hypotension    High output ileostomy ([place]) 09/30/2014   FAP (familial adenomatous polyposis) 09/30/2014   Nephrolithiasis 09/30/2014   Adenocarcinoma of colon ([place]) 05/26/2013   Nausea with vomiting 05/21/2013   Microcytic anemia 04/25/2013   Hypokalemia 04/25/2013    Past Surgical History:  Procedure Laterality Date   ABDOMINAL SURGERY     COLONOSCOPY N/A 05/24/2013   Procedure: COLONOSCOPY;  Surgeon: Missy Sabins, MD;  Location: [place];  Service: Endoscopy;  Laterality: N/A;   COMPLETION PROTECTOMY AND REVISION ILEOSTOMY/ LYSIS ADHESIONS  07-28-2014    [facility]   CYSTOSCOPY W/ RETROGRADES Right 12/15/2016   Procedure: CYSTOSCOPY WITH RETROGRADE PYELOGRAM;  Surgeon: Alexis Frock, MD;  Location: [facility];  Service: Urology;  Laterality: Right;  CYSTOSCOPY W/ URETERAL STENT PLACEMENT Bilateral 11/01/2016   Procedure: CYSTOSCOPY WITH BILATERAL RETROGRADE PYELOGRAM BILATERAL URETERAL STENT PLACEMENT;  Surgeon: Alexis Frock, MD;  Location: [facility];  Service: Urology;  Laterality: Bilateral;   CYSTOSCOPY W/ URETERAL STENT PLACEMENT Right 12/15/2016   Procedure: CYSTOSCOPY WITH STENT REPLACEMENT;  Surgeon: Alexis Frock, MD;  Location: [facility];  Service: Urology;  Laterality: Right;   CYSTOSCOPY W/ URETERAL STENT  REMOVAL Bilateral 12/15/2016   Procedure: CYSTOSCOPY WITH STENT REMOVAL;  Surgeon: Alexis Frock, MD;  Location: [facility];  Service: Urology;  Laterality: Bilateral;   CYSTOSCOPY WITH URETEROSCOPY Bilateral 12/01/2016   Procedure: FIRST STAGE CYSTOSCOPY WITH URETEROSCOPY,  STONE MANIPULATION and BASKETRY, STENT EXCHANGE;  Surgeon: Alexis Frock, MD;  Location: [facility];  Service: Urology;  Laterality: Bilateral;   CYSTOSCOPY WITH URETEROSCOPY Right 12/15/2016   Procedure: SECOND STAGE -CYSTOSCOPY WITH URETEROSCOPY AND STONE EXTRACTION WITH BASKET;  Surgeon: Alexis Frock, MD;  Location: [facility];  Service: Urology;  Laterality: Right;   HOLMIUM LASER APPLICATION Bilateral 02/02/4535   Procedure: HOLMIUM LASER APPLICATION;  Surgeon: Alexis Frock, MD;  Location: [facility];  Service: Urology;  Laterality: Bilateral;   HOLMIUM LASER APPLICATION Right 4/68/0321   Procedure: HOLMIUM LASER APPLICATION;  Surgeon: Alexis Frock, MD;  Location: [facility];  Service: Urology;  Laterality: Right;   I & D EXTREMITY Left 11/14/2013   Procedure: IRRIGATION AND DEBRIDEMENT LEFT LONG FINGER;  Surgeon: Tennis Must, MD;  Location: [place];  Service: Orthopedics;  Laterality: Left;   IM NAILING TIBIA Left 08/23/2010   LAPAROSCOPIC APPENDECTOMY  12/23/2007   LEG RECONSTRUCTION USING FASCIAL FLAP  ~ 2009   SUBTOTAL COLECTOMY /  RESECTION ENBLOC DISTAL ILEUM/  ILEOSTOMY  07/ 2014  [facility]   TRANSTHORACIC ECHOCARDIOGRAM  11/11/2011   ef 55-60%/  mild MR/  trivial PR and TR/  PASP 29-47mmHg/  trivial pericardial effusion identified    Prior to Admission medications   Medication Sig Start Date End Date Taking? Authorizing Provider  ondansetron (ZOFRAN ODT) 4 MG disintegrating tablet Take 1 tablet (4 mg total) by mouth every 6 (six) hours as needed for nausea or vomiting. 07/21/21  Yes Anwitha Mapes, Cyril Mourning N, DO  pantoprazole  (PROTONIX) 40 MG tablet Take 1 tablet (40 mg total) by mouth daily. 07/21/21 09/19/21 Yes Carmel Garfield N, DO  CALCIUM PO Take 1 Dose by mouth daily.    [provider]  Cholecalciferol (VITAMIN D3 PO) Take 1 Dose by mouth daily.    [provider]  cyanocobalamin (,VITAMIN B-12,) 1000 MCG/ML injection Inject 1,000 mcg into the muscle every 30 (thirty) days.    [provider]  MAGNESIUM PO Take 1 Dose by mouth daily.    [provider]  potassium chloride SA (KLOR-CON) 20 MEQ tablet Take 1 tablet (20 mEq total) by mouth 2 (two) times daily for 3 days. 05/31/21 06/03/21  Dessa Phi, DO  saccharomyces boulardii (FLORASTOR) 250 MG capsule Take 1 capsule (250 mg total) by mouth 2 (two) times daily. Any generic probiotic is okay 07/21/20   Rai, Vernelle Emerald, MD    Allergies Food, Iron, Iron sucrose, Vancomycin, Venofer [ferric oxide], Soap, Sulfamethoxazole-trimethoprim, Levaquin [levofloxacin], Morphine and related, and Sulfa antibiotics  Family History  Adopted: Yes  Problem Relation Age of Onset   ALS Mother    Healthy Father    Alcohol abuse Neg Hx    Arthritis Neg Hx  Asthma Neg Hx    Birth defects Neg Hx    Cancer Neg Hx    COPD Neg Hx    Depression Neg Hx    Diabetes Neg Hx    Drug abuse Neg Hx    Early death Neg Hx    Hearing loss Neg Hx    Heart disease Neg Hx    Hyperlipidemia Neg Hx    Hypertension Neg Hx    Kidney disease Neg Hx    Learning disabilities Neg Hx    Mental illness Neg Hx    Mental retardation Neg Hx    Miscarriages / Stillbirths Neg Hx    Stroke Neg Hx    Vision loss Neg Hx     Social History Social History   Tobacco Use   Smoking status: Never   Smokeless tobacco: Never  Vaping Use   Vaping Use: Never used  Substance Use Topics   Alcohol use: No   Drug use: No    Review of Systems Constitutional: No fever. Eyes: No visual changes. ENT: No sore throat. Cardiovascular: Denies chest pain. Respiratory:  Denies shortness of breath. Gastrointestinal: + Nausea.  No vomiting. Genitourinary: Negative for dysuria. Musculoskeletal: Negative for back pain. Skin: Negative for rash. Neurological: Negative for focal weakness or numbness.  ____________________________________________   PHYSICAL EXAM:  VITAL SIGNS: ED Triage Vitals  Enc Vitals Group     BP 07/20/21 2120 92/64     Pulse Rate 07/20/21 2120 (!) 101     Resp 07/20/21 2120 16     Temp 07/20/21 2120 98.1 F (36.7 C)     Temp Source 07/20/21 2120 Oral     SpO2 --      Weight 07/20/21 2120 104 lb (47.2 kg)     Height 07/20/21 2120 5' (1.524 m)     Head Circumference --      Peak Flow --      Pain Score 07/20/21 2141 8     Pain Loc --      Pain Edu? --      Excl. in [place]? --    CONSTITUTIONAL: Alert and oriented and responds appropriately to questions.  Thin, afebrile, nontoxic-appearing HEAD: Normocephalic EYES: Conjunctivae clear, pupils appear equal, EOM appear intact ENT: normal nose; slightly dry mucous membranes NECK: Supple, normal ROM CARD: Regular and minimally tachycardic; S1 and S2 appreciated; no murmurs, no clicks, no rubs, no gallops RESP: Normal chest excursion without splinting or tachypnea; breath sounds clear and equal bilaterally; no wheezes, no rhonchi, no rales, no hypoxia or respiratory distress, speaking full sentences ABD/GI: Normal bowel sounds; non-distended; soft, diffusely tender with voluntary guarding, ileostomy without surrounding redness, warmth.  Normal-appearing loose brown stool seen in ileostomy without gross blood, melena. BACK: The back appears normal EXT: Normal ROM in all joints; no deformity noted, no edema; no cyanosis SKIN: Normal color for age and race; warm; no rash on exposed skin NEURO: Moves all extremities equally PSYCH: The patient's mood and manner are appropriate.  ____________________________________________   LABS (all labs ordered are listed, but only abnormal results  are displayed)  Labs Reviewed  LIPASE, BLOOD - Abnormal; Notable for the following components:      Result Value   Lipase 58 (*)    All other components within normal limits  COMPREHENSIVE METABOLIC PANEL - Abnormal; Notable for the following components:   Sodium 131 (*)    Potassium 2.9 (*)    CO2 18 (*)    Glucose, Bld 101 (*)  BUN 29 (*)    Creatinine, Ser 2.59 (*)    Calcium 8.0 (*)    Total Protein 8.2 (*)    GFR, Estimated 25 (*)    All other components within normal limits  CBC - Abnormal; Notable for the following components:   WBC 12.2 (*)    HCT 35.2 (*)    All other components within normal limits  URINALYSIS, COMPLETE (UACMP) WITH MICROSCOPIC - Abnormal; Notable for the following components:   Color, Urine YELLOW (*)    APPearance HAZY (*)    Bacteria, UA RARE (*)    All other components within normal limits  MAGNESIUM  LACTIC ACID, PLASMA  PROCALCITONIN  POC URINE PREG, ED   ____________________________________________  EKG   EKG Interpretation  Date/Time:  Thursday July 21 2021 01:26:23 EDT Ventricular Rate:  72 PR Interval:  171 QRS Duration: 81 QT Interval:  392 QTC Calculation: 429 R Axis:   97 Text Interpretation: Sinus rhythm Consider right atrial enlargement Right ventricular hypertrophy Abnormal lateral Q waves that is unchanged Abnormal T, probable ischemia, lateral leads that are new compared to most recent EKG ST elevation, consider inferior injury that is unchanged Confirmed by Pryor Curia [phone]) on 07/21/2021 1:57:45 AM        ____________________________________________  RADIOLOGY Jessie Foot Clemencia Helzer, personally viewed and evaluated these images (plain radiographs) as part of my medical decision making, as well as reviewing the written report by the radiologist.  ED MD interpretation:  gastritis  Official radiology report(s): CT ABDOMEN PELVIS WO CONTRAST  Result Date: 07/21/2021 CLINICAL DATA:  30 year old female with  abdominal pain, decreased urination. History of colon cancer, prior colectomy, right lower quadrant ileostomy. EXAM: CT ABDOMEN AND PELVIS WITHOUT CONTRAST TECHNIQUE: Multidetector CT imaging of the abdomen and pelvis was performed following the standard protocol without IV contrast. COMPARISON:  Noncontrast CT Abdomen and Pelvis 08/16/2020 and earlier. FINDINGS: Lower chest: Negative. Hepatobiliary: Negative noncontrast liver and gallbladder. Pancreas: Negative. Spleen: Negative. Adrenals/Urinary Tract: Normal adrenal glands. Elongated kidneys again noted with questionably duplicated renal collecting systems. Punctate right lower pole nephrolithiasis, but no convincing hydronephrosis. No perinephric stranding. Proximal ureters are decompressed. Stable and unremarkable bladder. Chronic pelvic phleboliths. Stomach/Bowel: Chronically absent large bowel with right lower quadrant ileostomy. Oral contrast administered and has reached the ileostomy bag without obstruction. No dilated or abnormal small bowel loops. However, there is conspicuous eccentric gastric wall thickening (series 2, image 20). No perigastric inflammation. Duodenum is decompressed. No free air. No free fluid in the abdomen. Vascular/Lymphatic: Normal caliber abdominal aorta. No calcified atherosclerosis. No lymphadenopathy. Reproductive: Negative noncontrast appearance. Other: Small volume of pelvic free fluid similar to that in 2021 and likely physiologic. Musculoskeletal: Negative. IMPRESSION: 1. Bulky and eccentric gastric wall thickening. This might be artifactual but is suspicious for gastritis or possibly an infiltrative gastric mass. No associated perigastric inflammation. No bowel obstruction. Upper endoscopy would be most valuable and is recommended. 2. No other acute or inflammatory process identified in the non-contrast abdomen. Chronic colectomy. Ileostomy with no adverse features. 3. Small volume of pelvic free fluid is likely  physiologic. Electronically Signed   By: Genevie Ann M.D.   On: 07/21/2021 07:51    ____________________________________________   PROCEDURES  Procedure(s) performed (including Critical Care):  Procedures   ____________________________________________   INITIAL IMPRESSION / ASSESSMENT AND PLAN / ED COURSE  As part of my medical decision making, I reviewed the following data within the [place] notes reviewed and incorporated, Labs  reviewed , Old chart reviewed, CT reviewed, and Notes from prior ED visits         Patient here with complaints of diffuse abdominal pain, nausea.  Is slightly tachycardic with low blood pressure.  Will hydrate patient.  Labs show a leukocytosis of 12,000.  Potassium of 2.9 which appears chronic.  Will check EKG, magnesium level and replace her potassium with IV and oral.  Chronic kidney disease appears stable compared to her most recent labs.  Urine does not show obvious signs of infection other than 6-10 white blood cells but she is not having urinary symptoms.  Differential includes bowel obstruction, viral gastroenteritis, cholelithiasis, cholecystitis, pancreatitis, pyelonephritis, kidney stone, appendicitis.  Will obtain CT of abdomen pelvis.  Will give IV fluids, pain and nausea medicine.  ED PROGRESS  Patient has had blood pressures in the 74B to 44H systolic here.  She states it is normal for her blood pressure to be low.  She denies dizziness, chest pain or shortness of breath.  Afebrile here.  Lactic, procalcitonin pending.  Nonsurgical abdominal exam.  She is getting 2 L of IV fluids.  Manual blood pressure is 94/62.  8:35 AM  Pt's CT scan shows bulky and eccentric gastric wall thickening that could be artifact versus gastritis versus infiltrative gastric mass.  No other acute abnormality noted.  Patient has been able to tolerate p.o. here.  We did discuss these findings of her CT scan.  We discussed that this could possibly  be a mass and given her history she should follow-up with her gastroenterologist at [facility] very closely.  Will discharge with Protonix, Zofran.  Recommended diet changes.  Patient is comfortable with this plan.   At this time, I do not feel there is any life-threatening condition present. I have reviewed, interpreted and discussed all results (EKG, imaging, lab, urine as appropriate) and exam findings with patient/family. I have reviewed nursing notes and appropriate previous records.  I feel the patient is safe to be discharged home without further emergent workup and can continue workup as an outpatient as needed. Discussed usual and customary return precautions. Patient/family verbalize understanding and are comfortable with this plan.  Outpatient follow-up has been provided as needed. All questions have been answered.  ____________________________________________   FINAL CLINICAL IMPRESSION(S) / ED DIAGNOSES  Final diagnoses:  Gastritis without bleeding, unspecified chronicity, unspecified gastritis type     ED Discharge Orders          Ordered    pantoprazole (PROTONIX) 40 MG tablet  Daily        07/21/21 0838    ondansetron (ZOFRAN ODT) 4 MG disintegrating tablet  Every 6 hours PRN        07/21/21 6759            *Please note:  Averyana E Stum was evaluated in Emergency Department on 07/21/2021 for the symptoms described in the history of present illness. She was evaluated in the context of the global COVID-19 pandemic, which necessitated consideration that the patient might be at risk for infection with the SARS-CoV-2 virus that causes COVID-19. Institutional protocols and algorithms that pertain to the evaluation of patients at risk for COVID-19 are in a state of rapid change based on information released by regulatory bodies including the CDC and federal and state organizations. These policies and algorithms were followed during the patient's care in the ED.  Some ED  evaluations and interventions may be delayed as a result of limited staffing during and the  pandemic.*   Note:  This document was prepared using Dragon voice recognition software and may include unintentional dictation errors.    Maleeah Crossman, Delice Bison, DO 07/21/21 [phone]

## 2021-07-21 NOTE — ED Notes (Signed)
Dr. Ward at bedside.

## 2021-07-21 NOTE — ED Notes (Signed)
Pt finished drinking contrast.

## 2021-08-22 ENCOUNTER — Encounter (HOSPITAL_COMMUNITY): Payer: Self-pay | Admitting: Radiology

## 2022-07-03 ENCOUNTER — Other Ambulatory Visit: Payer: Self-pay

## 2022-07-03 ENCOUNTER — Emergency Department (HOSPITAL_BASED_OUTPATIENT_CLINIC_OR_DEPARTMENT_OTHER)
Admission: EM | Admit: 2022-07-03 | Discharge: 2022-07-03 | Disposition: A | Discharge status: Home / Self Care | Payer: PRIVATE HEALTH INSURANCE | Attending: Emergency Medicine | Admitting: Emergency Medicine

## 2022-07-03 ENCOUNTER — Encounter (HOSPITAL_BASED_OUTPATIENT_CLINIC_OR_DEPARTMENT_OTHER): Payer: Self-pay | Admitting: Emergency Medicine

## 2022-07-03 DIAGNOSIS — Z5321 Procedure and treatment not carried out due to patient leaving prior to being seen by health care provider: Secondary | ICD-10-CM | POA: Insufficient documentation

## 2022-07-03 DIAGNOSIS — R1030 Lower abdominal pain, unspecified: Secondary | ICD-10-CM | POA: Insufficient documentation

## 2022-07-03 DIAGNOSIS — N939 Abnormal uterine and vaginal bleeding, unspecified: Secondary | ICD-10-CM | POA: Diagnosis not present

## 2022-07-03 NOTE — ED Notes (Signed)
Called x 2, pt left lobby per registration staff.

## 2022-07-03 NOTE — ED Triage Notes (Signed)
Lower abdominal pain started today , was Dx ovarian cyst 2 weeks ago .  Denies urinary symptoms , reports vaginal spotting started today .

## 2024-01-10 ENCOUNTER — Other Ambulatory Visit: Payer: Self-pay

## 2024-01-10 ENCOUNTER — Other Ambulatory Visit: Payer: Self-pay | Admitting: Internal Medicine

## 2024-01-10 ENCOUNTER — Encounter (HOSPITAL_COMMUNITY): Payer: Self-pay | Admitting: Family Medicine

## 2024-01-10 ENCOUNTER — Emergency Department (HOSPITAL_BASED_OUTPATIENT_CLINIC_OR_DEPARTMENT_OTHER): Payer: PRIVATE HEALTH INSURANCE | Admitting: Radiology

## 2024-01-10 ENCOUNTER — Observation Stay (HOSPITAL_BASED_OUTPATIENT_CLINIC_OR_DEPARTMENT_OTHER)
Admission: EM | Admit: 2024-01-10 | Discharge: 2024-01-11 | Disposition: A | Discharge status: Home / Self Care | Payer: PRIVATE HEALTH INSURANCE | Attending: Internal Medicine | Admitting: Internal Medicine

## 2024-01-10 DIAGNOSIS — R509 Fever, unspecified: Secondary | ICD-10-CM | POA: Diagnosis present

## 2024-01-10 DIAGNOSIS — N189 Chronic kidney disease, unspecified: Secondary | ICD-10-CM | POA: Diagnosis present

## 2024-01-10 DIAGNOSIS — U071 COVID-19: Secondary | ICD-10-CM | POA: Diagnosis not present

## 2024-01-10 DIAGNOSIS — Z79899 Other long term (current) drug therapy: Secondary | ICD-10-CM | POA: Insufficient documentation

## 2024-01-10 DIAGNOSIS — E8722 Chronic metabolic acidosis: Secondary | ICD-10-CM | POA: Diagnosis present

## 2024-01-10 DIAGNOSIS — E871 Hypo-osmolality and hyponatremia: Secondary | ICD-10-CM | POA: Diagnosis not present

## 2024-01-10 DIAGNOSIS — N179 Acute kidney failure, unspecified: Secondary | ICD-10-CM | POA: Diagnosis not present

## 2024-01-10 DIAGNOSIS — N184 Chronic kidney disease, stage 4 (severe): Secondary | ICD-10-CM

## 2024-01-10 DIAGNOSIS — E872 Acidosis, unspecified: Secondary | ICD-10-CM

## 2024-01-10 DIAGNOSIS — E876 Hypokalemia: Secondary | ICD-10-CM | POA: Diagnosis present

## 2024-01-10 DIAGNOSIS — I959 Hypotension, unspecified: Secondary | ICD-10-CM | POA: Diagnosis not present

## 2024-01-10 DIAGNOSIS — E861 Hypovolemia: Secondary | ICD-10-CM | POA: Diagnosis not present

## 2024-01-10 LAB — CBC
HCT: 36.7 % (ref 36.0–46.0)
Hemoglobin: 12.4 g/dL (ref 12.0–15.0)
MCH: 27.8 pg (ref 26.0–34.0)
MCHC: 33.8 g/dL (ref 30.0–36.0)
MCV: 82.3 fL (ref 80.0–100.0)
Platelets: 329 10*3/uL (ref 150–400)
RBC: 4.46 MIL/uL (ref 3.87–5.11)
RDW: 12.8 % (ref 11.5–15.5)
WBC: 8.2 10*3/uL (ref 4.0–10.5)
nRBC: 0 % (ref 0.0–0.2)

## 2024-01-10 LAB — RESP PANEL BY RT-PCR (RSV, FLU A&B, COVID)  RVPGX2
Influenza A by PCR: NEGATIVE
Influenza B by PCR: NEGATIVE
Resp Syncytial Virus by PCR: NEGATIVE
SARS Coronavirus 2 by RT PCR: POSITIVE — AB

## 2024-01-10 LAB — BASIC METABOLIC PANEL
Anion gap: 18 — ABNORMAL HIGH (ref 5–15)
BUN: 38 mg/dL — ABNORMAL HIGH (ref 6–20)
CO2: 15 mmol/L — ABNORMAL LOW (ref 22–32)
Calcium: 8.5 mg/dL — ABNORMAL LOW (ref 8.9–10.3)
Chloride: 96 mmol/L — ABNORMAL LOW (ref 98–111)
Creatinine, Ser: 3.97 mg/dL — ABNORMAL HIGH (ref 0.44–1.00)
GFR, Estimated: 14 mL/min — ABNORMAL LOW (ref >60)
Glucose, Bld: 96 mg/dL (ref 70–99)
Potassium: 2.7 mmol/L — CL (ref 3.5–5.1)
Sodium: 129 mmol/L — ABNORMAL LOW (ref 135–145)

## 2024-01-10 LAB — MAGNESIUM: Magnesium: 1.7 mg/dL (ref 1.7–2.4)

## 2024-01-10 MED ORDER — LACTATED RINGERS IV BOLUS
1000.0000 mL | Freq: Once | INTRAVENOUS | Status: AC
  Administered 2024-01-10: 1000 mL via INTRAVENOUS

## 2024-01-10 MED ORDER — HEPARIN SODIUM (PORCINE) 5000 UNIT/ML IJ SOLN: 5000.0000 [IU] | Freq: Three times a day (TID) | INTRAMUSCULAR | Status: DC

## 2024-01-10 MED ORDER — STERILE WATER FOR INJECTION IV SOLN
INTRAVENOUS | Status: DC
  Filled 2024-01-10: qty 1000
  Filled 2024-01-10 (×2): qty 150

## 2024-01-10 MED ORDER — PROCHLORPERAZINE EDISYLATE 10 MG/2ML IJ SOLN: 5.0000 mg | Freq: Four times a day (QID) | INTRAMUSCULAR | Status: DC | PRN

## 2024-01-10 MED ORDER — LACTATED RINGERS IV BOLUS
500.0000 mL | Freq: Once | INTRAVENOUS | Status: AC
  Administered 2024-01-10: 500 mL via INTRAVENOUS

## 2024-01-10 MED ORDER — ACETAMINOPHEN 160 MG/5ML PO SOLN
650.0000 mg | Freq: Once | ORAL | Status: AC
  Administered 2024-01-10: 650 mg via ORAL
  Filled 2024-01-10: qty 20.3

## 2024-01-10 MED ORDER — ACETAMINOPHEN 650 MG RE SUPP: 650.0000 mg | Freq: Four times a day (QID) | RECTAL | Status: DC | PRN

## 2024-01-10 MED ORDER — SENNA 8.6 MG PO TABS: 1.0000 | ORAL_TABLET | Freq: Every day | ORAL | Status: DC | PRN

## 2024-01-10 MED ORDER — PANTOPRAZOLE SODIUM 40 MG PO TBEC
40.0000 mg | DELAYED_RELEASE_TABLET | Freq: Every day | ORAL | Status: DC
  Administered 2024-01-11: 40 mg via ORAL
  Filled 2024-01-10: qty 1

## 2024-01-10 MED ORDER — SODIUM CHLORIDE 0.9% FLUSH
3.0000 mL | Freq: Two times a day (BID) | INTRAVENOUS | Status: DC
  Administered 2024-01-11: 3 mL via INTRAVENOUS

## 2024-01-10 MED ORDER — ACETAMINOPHEN 325 MG PO TABS
650.0000 mg | ORAL_TABLET | Freq: Once | ORAL | Status: DC
  Filled 2024-01-10: qty 2

## 2024-01-10 MED ORDER — POTASSIUM CHLORIDE 20 MEQ PO PACK
40.0000 meq | PACK | Freq: Once | ORAL | Status: AC
  Administered 2024-01-10: 40 meq via ORAL
  Filled 2024-01-10: qty 2

## 2024-01-10 MED ORDER — SODIUM CHLORIDE 0.9 % IV SOLN: INTRAVENOUS | Status: AC | PRN

## 2024-01-10 MED ORDER — ONDANSETRON 4 MG PO TBDP
4.0000 mg | ORAL_TABLET | Freq: Once | ORAL | Status: AC
  Administered 2024-01-10: 4 mg via ORAL
  Filled 2024-01-10: qty 1

## 2024-01-10 MED ORDER — OXYCODONE HCL 5 MG PO TABS
5.0000 mg | ORAL_TABLET | ORAL | Status: DC | PRN
  Administered 2024-01-10 – 2024-01-11 (×2): 5 mg via ORAL
  Filled 2024-01-10 (×2): qty 1

## 2024-01-10 MED ORDER — GUAIFENESIN 100 MG/5ML PO LIQD: 5.0000 mL | ORAL | Status: DC | PRN

## 2024-01-10 MED ORDER — ACETAMINOPHEN 325 MG PO TABS
650.0000 mg | ORAL_TABLET | Freq: Four times a day (QID) | ORAL | Status: DC | PRN
  Administered 2024-01-11: 650 mg via ORAL
  Filled 2024-01-10: qty 2

## 2024-01-10 MED ORDER — POTASSIUM CHLORIDE 10 MEQ/100ML IV SOLN
10.0000 meq | INTRAVENOUS | Status: AC
  Administered 2024-01-10 (×3): 10 meq via INTRAVENOUS
  Filled 2024-01-10 (×3): qty 100

## 2024-01-10 MED ORDER — MAGNESIUM SULFATE IN D5W 1-5 GM/100ML-% IV SOLN
1.0000 g | Freq: Once | INTRAVENOUS | Status: AC
Start: 2024-01-10 — End: 2024-01-11
  Administered 2024-01-10: 1 g via INTRAVENOUS
  Filled 2024-01-10: qty 100

## 2024-01-10 NOTE — ED Triage Notes (Signed)
Pt c/o cough, weakness with fever x 2 days

## 2024-01-10 NOTE — H&P (Signed)
History and Physical    Cindy [name] EAV:[id number] DOB: 28-Sep-1991 DOA: 01/10/2024  PCP: Ovidio Kin, MD   Patient coming from: Home   Chief Complaint: Fever, cough, fatigue, loss of appetite  HPI: Cindy [name] is a 33 y.o. 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  Patient developed sore throat the night of 01/07/2024.  The following day, she had nonproductive cough, generalized aches, and severe fatigue.  She has also had loss of appetite.  Fatigue and aches have worsened significantly over the past day.  There is no chest pain, abdominal pain, or change in her ostomy output.  She typically receives IV fluids via port in her right chest every Friday.  She is followed by nephrology at [facility].  [facility] ED Course: Upon arrival to the ED, patient is found to be afebrile and saturating in the 90s on room air with normal RR, normal HR, and systolic blood pressure ranging from mid 70s to mid 90s.  Labs are most below for sodium 129, potassium 2.7, bicarbonate 15, BUN 38, creatinine 3.97, anion gap 18, normal WBC, normal hemoglobin, and positive COVID-19 PCR.  Patient was treated with 1 L of LR, acetaminophen, Zofran, and IV potassium. She was transferred to [facility] for admission.  Review of Systems:  All other systems reviewed and apart from HPI, are negative.  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 appy 12-23-2007 (emergent ruptured appendix)   FAP (familial adenomatous polyposis) 09/30/2014   Heart murmur    History of acute renal failure    2015;  2016   History of fetal demise, not currently pregnant    03-22-2013  stillborn at 24 wks   History of kidney stones    History of sepsis    admitted 12-03-2016 (post-op day 2 w/ bilateral ureteral stents and stone manipluation) discharged 12-06-2016  for urosepsis   Hypomagnesemia    Ileostomy in place [facility]    Iron deficiency anemia    Nephrolithiasis    BILATERAL    Normocytic anemia    Renal cyst, left    PER CT 11-12-2016   Urgency of urination     Past Surgical History:  Procedure Laterality Date   ABDOMINAL SURGERY     COLONOSCOPY N/A 05/24/2013   Procedure: COLONOSCOPY;  Surgeon: Barrie Folk, MD;  Location: [facility] ENDOSCOPY;  Service: Endoscopy;  Laterality: N/A;   COMPLETION PROTECTOMY AND REVISION ILEOSTOMY/ LYSIS ADHESIONS  07-28-2014    [facility]   CYSTOSCOPY W/ RETROGRADES Right 12/15/2016   Procedure: CYSTOSCOPY WITH RETROGRADE PYELOGRAM;  Surgeon: Sebastian Ache, MD;  Location: [facility];  Service: Urology;  Laterality: Right;   CYSTOSCOPY W/ URETERAL STENT PLACEMENT Bilateral 11/01/2016   Procedure: CYSTOSCOPY WITH BILATERAL RETROGRADE PYELOGRAM BILATERAL URETERAL STENT PLACEMENT;  Surgeon: Sebastian Ache, MD;  Location: [facility];  Service: Urology;  Laterality: Bilateral;   CYSTOSCOPY W/ URETERAL STENT PLACEMENT Right 12/15/2016   Procedure: CYSTOSCOPY WITH STENT REPLACEMENT;  Surgeon: Sebastian Ache, MD;  Location: [facility];  Service: Urology;  Laterality: Right;   CYSTOSCOPY W/ URETERAL STENT REMOVAL Bilateral 12/15/2016   Procedure: CYSTOSCOPY WITH STENT REMOVAL;  Surgeon: Sebastian Ache, MD;  Location: [facility];  Service: Urology;  Laterality: Bilateral;   CYSTOSCOPY WITH  URETEROSCOPY Bilateral 12/01/2016   Procedure: FIRST STAGE CYSTOSCOPY WITH URETEROSCOPY,  STONE MANIPULATION and BASKETRY, STENT EXCHANGE;  Surgeon: Sebastian Ache, MD;  Location: [facility];  Service: Urology;  Laterality: Bilateral;   CYSTOSCOPY WITH URETEROSCOPY Right 12/15/2016   Procedure: SECOND STAGE -CYSTOSCOPY WITH URETEROSCOPY AND STONE EXTRACTION WITH BASKET;  Surgeon: Sebastian Ache, MD;  Location: [facility];  Service: Urology;  Laterality: Right;   HOLMIUM LASER APPLICATION Bilateral 12/01/2016   Procedure: HOLMIUM LASER APPLICATION;  Surgeon: Sebastian Ache, MD;  Location: [facility];  Service: Urology;  Laterality: Bilateral;   HOLMIUM LASER APPLICATION Right 12/15/2016   Procedure: HOLMIUM LASER APPLICATION;  Surgeon: Sebastian Ache, MD;  Location: [facility];  Service: Urology;  Laterality: Right;   I & D EXTREMITY Left 11/14/2013   Procedure: IRRIGATION AND DEBRIDEMENT LEFT LONG FINGER;  Surgeon: Tami Ribas, MD;  Location: [facility];  Service: Orthopedics;  Laterality: Left;   IM NAILING TIBIA Left 08/23/2010   LAPAROSCOPIC APPENDECTOMY  12/23/2007   LEG RECONSTRUCTION USING FASCIAL FLAP  ~ 2009   SUBTOTAL COLECTOMY /  RESECTION ENBLOC DISTAL ILEUM/  ILEOSTOMY  07/ 2014  [facility]   TRANSTHORACIC ECHOCARDIOGRAM  11/11/2011   ef 55-60%/  mild MR/  trivial PR and TR/  PASP 29-21mmHg/  trivial pericardial effusion identified    Social History:   reports that she has never smoked. She has never used smokeless tobacco. She reports that she does not drink alcohol and does not use drugs.  Allergies  Allergen Reactions   Food Anaphylaxis and Other (See Comments)    Orange juice   Iron Palpitations   Iron Sucrose Anaphylaxis    She had anaphylaxis to venofier but was given ferrumoxytol in allergy clinic by Dr. Silverio Decamp and tolerated well in 2020   Vancomycin Anaphylaxis   Venofer [Ferric Oxide]  Anaphylaxis   Soap Hives, Itching and Other (See Comments)    Dove soap   Sulfamethoxazole-Trimethoprim Nausea And Vomiting    Patient developed AKI that appears to have been from bactrim. She might be able to take again if watched closely and it is absolutely needed - ie there was no anaphylaxis or allergic rash   Levaquin [Levofloxacin] Hives   Morphine And Codeine Swelling    When given IV, swelling around site   Sulfa Antibiotics Other (See Comments)    dehydration    Family History  Adopted: Yes  Problem Relation Age of Onset   ALS Mother    Healthy Father    Alcohol abuse Neg Hx    Arthritis Neg Hx    Asthma Neg Hx    Birth defects Neg Hx    Cancer Neg Hx    COPD Neg Hx    Depression Neg Hx    Diabetes Neg Hx    Drug abuse Neg Hx    Early death Neg Hx    Hearing loss Neg Hx    Heart disease Neg Hx    Hyperlipidemia Neg Hx  Hypertension Neg Hx    Kidney disease Neg Hx    Learning disabilities Neg Hx    Mental illness Neg Hx    Mental retardation Neg Hx    Miscarriages / Stillbirths Neg Hx    Stroke Neg Hx    Vision loss Neg Hx      Prior to Admission medications   Medication Sig Start Date End Date Taking? Authorizing Provider  famotidine (PEPCID) 40 MG/5ML suspension 40 mg 2 (two) times daily. 12/27/23 03/26/24  [provider]  predniSONE (STERAPRED UNI-PAK 21 TAB) 10 MG (21) TBPK tablet SMARTSIG:- Tablet(s) By Mouth - 01/08/24   [provider]  CALCIUM PO Take 1 Dose by mouth daily.    [provider]  Cholecalciferol (VITAMIN D3 PO) Take 1 Dose by mouth daily.    [provider]  cyanocobalamin (,VITAMIN B-12,) 1000 MCG/ML injection Inject 1,000 mcg into the muscle every 30 (thirty) days.   Yes [provider]  MAGNESIUM PO Take 1 Dose by mouth daily.    [provider]  ondansetron (ZOFRAN ODT) 4 MG disintegrating tablet Take 1 tablet (4 mg total) by mouth every 6 (six) hours as needed for nausea or  vomiting. 07/21/21  Yes Ward, Baxter Hire N, DO  pantoprazole (PROTONIX) 40 MG tablet Take 1 tablet (40 mg total) by mouth daily. 07/21/21 09/19/21  Ward, Layla Maw, DO  potassium chloride SA (KLOR-CON) 20 MEQ tablet Take 1 tablet (20 mEq total) by mouth 2 (two) times daily for 3 days. 05/31/21 01/10/24 Yes Noralee Stain, DO  saccharomyces boulardii (FLORASTOR) 250 MG capsule Take 1 capsule (250 mg total) by mouth 2 (two) times daily. Any generic probiotic is okay 07/21/20  Yes Rai, Delene Ruffini, MD    Physical Exam: Vitals:   01/10/24 1500 01/10/24 1530 01/10/24 1600 01/10/24 1745  BP: (!) 80/47 (!) 88/45 (!) 90/56 (!) 90/56  Pulse: 88 82 81 68  Resp:   15   Temp:   98.7 F (37.1 C) 98.5 F (36.9 C)  TempSrc:   Oral Oral  SpO2: (!) 89% 100% 100% 100%  Weight:        Constitutional: NAD, calm  Eyes: PERTLA, lids and conjunctivae normal ENMT: Mucous membranes are moist. Posterior pharynx clear of any exudate or lesions.   Neck: supple, no masses  Respiratory: no wheezing, no crackles. No accessory muscle use.  Cardiovascular: S1 & S2 heard, regular rate and rhythm. No extremity edema. No significant JVD. Abdomen:  no tenderness, soft. Bowel sounds active.  Musculoskeletal: no clubbing / cyanosis. No joint deformity upper and lower extremities.   Skin: no significant rashes, lesions, ulcers. Warm, dry, well-perfused. Neurologic: CN 2-12 grossly intact. Moving all extremities. Alert and oriented.  Psychiatric: Pleasant. Cooperative.    Labs and Imaging on Admission: I have personally reviewed following labs and imaging studies  CBC: Recent Labs  Lab 01/10/24 1140  WBC 8.2  HGB 12.4  HCT 36.7  MCV 82.3  PLT 329   Basic Metabolic Panel: Recent Labs  Lab 01/10/24 1140  NA 129*  K 2.7*  CL 96*  CO2 15*  GLUCOSE 96  BUN 38*  CREATININE 3.97*  CALCIUM 8.5*  MG 1.7   GFR: CrCl cannot be calculated (Unknown ideal weight.). Liver Function Tests: No results for input(s): "AST",  "ALT", "ALKPHOS", "BILITOT", "PROT", "ALBUMIN" in the last 168 hours. No results for input(s): "LIPASE", "AMYLASE" in the last 168 hours. No results for input(s): "AMMONIA" in the last 168 hours.  Coagulation Profile: No results for input(s): "INR", "PROTIME" in the last 168 hours. Cardiac Enzymes: No results for input(s): "CKTOTAL", "CKMB", "CKMBINDEX", "TROPONINI" in the last 168 hours. BNP (last 3 results) No results for input(s): "PROBNP" in the last 8760 hours. HbA1C: No results for input(s): "HGBA1C" in the last 72 hours. CBG: No results for input(s): "GLUCAP" in the last 168 hours. Lipid Profile: No results for input(s): "CHOL", "HDL", "LDLCALC", "TRIG", "CHOLHDL", "LDLDIRECT" in the last 72 hours. Thyroid Function Tests: No results for input(s): "TSH", "T4TOTAL", "FREET4", "T3FREE", "THYROIDAB" in the last 72 hours. Anemia Panel: No results for input(s): "VITAMINB12", "FOLATE", "FERRITIN", "TIBC", "IRON", "RETICCTPCT" in the last 72 hours. Urine analysis:    Component Value Date/Time   COLORURINE YELLOW (A) 07/20/2021 2125   APPEARANCEUR HAZY (A) 07/20/2021 2125   LABSPEC 1.012 07/20/2021 2125   PHURINE 5.0 07/20/2021 2125   GLUCOSEU NEGATIVE 07/20/2021 2125   HGBUR NEGATIVE 07/20/2021 2125   BILIRUBINUR NEGATIVE 07/20/2021 2125   KETONESUR NEGATIVE 07/20/2021 2125   PROTEINUR NEGATIVE 07/20/2021 2125   UROBILINOGEN 0.2 07/29/2015 0320   NITRITE NEGATIVE 07/20/2021 2125   LEUKOCYTESUR NEGATIVE 07/20/2021 2125   Sepsis Labs: @LABRCNTIP (procalcitonin:4,lacticidven:4) ) Recent Results (from the past 240 hours)  Resp panel by RT-PCR (RSV, Flu A&B, Covid) Anterior Nasal Swab     Status: Abnormal   Collection Time: 01/10/24  9:10 AM   Specimen: Anterior Nasal Swab  Result Value Ref Range Status   SARS Coronavirus 2 by RT PCR POSITIVE (A) NEGATIVE Final    Comment: (NOTE) SARS-CoV-2 target nucleic acids are DETECTED.  The SARS-CoV-2 RNA is generally detectable in upper  respiratory specimens during the acute phase of infection. Positive results are indicative of the presence of the identified virus, but do not rule out bacterial infection or co-infection with other pathogens not detected by the test. Clinical correlation with patient history and other diagnostic information is necessary to determine patient infection status. The expected result is Negative.  Fact Sheet for Patients: BloggerCourse.com  Fact Sheet for Healthcare Providers: SeriousBroker.it  This test is not yet approved or cleared by the [place] FDA and  has been authorized for detection and/or diagnosis of SARS-CoV-2 by FDA under an Emergency Use Authorization (EUA).  This EUA will remain in effect (meaning this test can be used) for the duration of  the COVID-19 declaration under Section 564(b)(1) of the A ct, 21 U.S.C. section 360bbb-3(b)(1), unless the authorization is terminated or revoked sooner.     Influenza A by PCR NEGATIVE NEGATIVE Final   Influenza B by PCR NEGATIVE NEGATIVE Final    Comment: (NOTE) The Xpert Xpress SARS-CoV-2/FLU/RSV plus assay is intended as an aid in the diagnosis of influenza from Nasopharyngeal swab specimens and should not be used as a sole basis for treatment. Nasal washings and aspirates are unacceptable for Xpert Xpress SARS-CoV-2/FLU/RSV testing.  Fact Sheet for Patients: BloggerCourse.com  Fact Sheet for Healthcare Providers: SeriousBroker.it  This test is not yet approved or cleared by the [place] FDA and has been authorized for detection and/or diagnosis of SARS-CoV-2 by FDA under an Emergency Use Authorization (EUA). This EUA will remain in effect (meaning this test can be used) for the duration of the COVID-19 declaration under Section 564(b)(1) of the Act, 21 U.S.C. section 360bbb-3(b)(1), unless the authorization is  terminated or revoked.     Resp Syncytial Virus by PCR NEGATIVE NEGATIVE Final    Comment: (NOTE) Fact Sheet for Patients: BloggerCourse.com  Fact Sheet for Healthcare  Providers: SeriousBroker.it  This test is not yet approved or cleared by the [place] and has been authorized for detection and/or diagnosis of SARS-CoV-2 by FDA under an Emergency Use Authorization (EUA). This EUA will remain in effect (meaning this test can be used) for the duration of the COVID-19 declaration under Section 564(b)(1) of the Act, 21 U.S.C. section 360bbb-3(b)(1), unless the authorization is terminated or revoked.  Performed at [facility], [address] [place], [place] [address]      Radiological Exams on Admission: DG Chest 2 View Result Date: 01/10/2024 CLINICAL DATA:  Cough, weakness and fever. EXAM: CHEST - 2 VIEW COMPARISON:  08/16/2020 FINDINGS: Right chest port with tip in the SVC.The cardiomediastinal contours are normal. The lungs are clear. Pulmonary vasculature is normal. No consolidation, pleural effusion, or pneumothorax. No acute osseous abnormalities are seen. IMPRESSION: No acute findings or explanation for cough. Electronically Signed   By: Narda Rutherford M.D.   On: 01/10/2024 09:49     Assessment/Plan   1. AKI superimposed on CKD IV; metabolic acidosis  - SCr is 3.97, up from 2.25 recently; serum bicarbonate is 15  - Likely prerenal in setting of short bowel syndrome and recent loss of appetite  - Hydrate with isotonic bicarbonate infusion, renally-dose medications, repeat chem panel in am   2. Hypotension d/t hypovolemia  - Asymptomatic, lactate is reassuringly normal  - Continue IVF hydration    3. Hypokalemia  - Replacing   4. COVID-19  - No significant respiratory symptoms  - Isolation, supportive care   5. Hyponatremia  - Appears chronic, monitor while hydrating with IVF     DVT prophylaxis: sq heparin  Code Status: Full  Level of Care: Level of care: Progressive Family Communication: None present   Disposition Plan:  Patient is from: home  Anticipated d/c is to: Home  Anticipated d/c date is: 2/14 or 01/12/24  Patient currently: Pending improved renal function and electrolytes  Consults called: none  Admission status: Observation     Briscoe Deutscher, MD [facility]  01/10/2024, 5:59 PM

## 2024-01-10 NOTE — ED Notes (Signed)
Report given to the next RN.Marland KitchenMarland Kitchen

## 2024-01-10 NOTE — ED Notes (Signed)
Unsuccessful port access, pt tolerated well. Another RN will attempt access

## 2024-01-10 NOTE — ED Provider Notes (Signed)
[place] EMERGENCY DEPARTMENT AT [facility] Provider Note   CSN: [id number] Arrival date & time: 01/10/24  0840     History  Chief Complaint  Patient presents with   Cough    Cindy [name] is a 33 y.o. female.  This is a 33 year old female presenting emergency department for generalized weakness and fevers x 2 days.  Complains of cough, generalized malaise, nausea.  Decreased p.o. intake.  No vomiting.  No change in ostomy output.   Cough      Home Medications Prior to Admission medications   Medication Sig Start Date End Date Taking? Authorizing Provider  famotidine (PEPCID) 40 MG/5ML suspension 40 mg 2 (two) times daily. 12/27/23 03/26/24 Yes [provider]  predniSONE (STERAPRED UNI-PAK 21 TAB) 10 MG (21) TBPK tablet SMARTSIG:- Tablet(s) By Mouth - 01/08/24  Yes [provider]  CALCIUM PO Take 1 Dose by mouth daily.    [provider]  Cholecalciferol (VITAMIN D3 PO) Take 1 Dose by mouth daily.    [provider]  cyanocobalamin (,VITAMIN B-12,) 1000 MCG/ML injection Inject 1,000 mcg into the muscle every 30 (thirty) days.    [provider]  MAGNESIUM PO Take 1 Dose by mouth daily.    [provider]  ondansetron (ZOFRAN ODT) 4 MG disintegrating tablet Take 1 tablet (4 mg total) by mouth every 6 (six) hours as needed for nausea or vomiting. 07/21/21   Ward, Layla Maw, DO  pantoprazole (PROTONIX) 40 MG tablet Take 1 tablet (40 mg total) by mouth daily. 07/21/21 09/19/21  Ward, Layla Maw, DO  potassium chloride SA (KLOR-CON) 20 MEQ tablet Take 1 tablet (20 mEq total) by mouth 2 (two) times daily for 3 days. 05/31/21 06/03/21  Noralee Stain, DO  saccharomyces boulardii (FLORASTOR) 250 MG capsule Take 1 capsule (250 mg total) by mouth 2 (two) times daily. Any generic probiotic is okay 07/21/20   Rai, Delene Ruffini, MD      Allergies    Food, Iron, Iron sucrose, Vancomycin, Venofer [ferric oxide], Soap,  Sulfamethoxazole-trimethoprim, Levaquin [levofloxacin], Morphine and codeine, and Sulfa antibiotics    Review of Systems   Review of Systems  Respiratory:  Positive for cough.     Physical Exam Updated Vital Signs BP 93/63 (BP Location: Right Arm)   Pulse 91   Temp 98.8 F (37.1 C)   Resp 16   Wt 49.9 kg   SpO2 100%   BMI 21.48 kg/m  Physical Exam Vitals and nursing note reviewed.  Constitutional:      General: She is not in acute distress.    Appearance: She is not toxic-appearing.  HENT:     Head: Normocephalic.     Nose: Nose normal.     Mouth/Throat:     Mouth: Mucous membranes are dry.  Eyes:     Conjunctiva/sclera: Conjunctivae normal.  Cardiovascular:     Rate and Rhythm: Normal rate and regular rhythm.  Pulmonary:     Effort: Pulmonary effort is normal.     Breath sounds: Normal breath sounds.  Abdominal:     General: Abdomen is flat. There is no distension.     Tenderness: There is no abdominal tenderness. There is no guarding or rebound.     Comments: Ostomy in place, brownish watery output  Musculoskeletal:        General: Normal range of motion.  Skin:    General: Skin is warm and dry.     Capillary Refill: Capillary refill takes less  than 2 seconds.  Neurological:     Mental Status: She is alert and oriented to person, place, and time.  Psychiatric:        Mood and Affect: Mood normal.        Behavior: Behavior normal.     ED Results / Procedures / Treatments   Labs (all labs ordered are listed, but only abnormal results are displayed) Labs Reviewed  RESP PANEL BY RT-PCR (RSV, FLU A&B, COVID)  [id number] - Abnormal; Notable for the following components:      Result Value   SARS Coronavirus 2 by RT PCR POSITIVE (*)    All other components within normal limits  BASIC METABOLIC PANEL - Abnormal; Notable for the following components:   Sodium 129 (*)    Potassium 2.7 (*)    Chloride 96 (*)    CO2 15 (*)    BUN 38 (*)    Creatinine, Ser 3.97 (*)     Calcium 8.5 (*)    GFR, Estimated 14 (*)    Anion gap 18 (*)    All other components within normal limits  CBC  MAGNESIUM    EKG None  Radiology DG Chest 2 View Result Date: 01/10/2024 CLINICAL DATA:  Cough, weakness and fever. EXAM: CHEST - 2 VIEW COMPARISON:  08/16/2020 FINDINGS: Right chest port with tip in the SVC.The cardiomediastinal contours are normal. The lungs are clear. Pulmonary vasculature is normal. No consolidation, pleural effusion, or pneumothorax. No acute osseous abnormalities are seen. IMPRESSION: No acute findings or explanation for cough. Electronically Signed   By: Narda Rutherford M.D.   On: 01/10/2024 09:49    Procedures Procedures    Medications Ordered in ED Medications  potassium chloride 10 mEq in 100 mL IVPB (has no administration in time range)  lactated ringers bolus 1,000 mL (1,000 mLs Intravenous New Bag/Given 01/10/24 1143)  ondansetron (ZOFRAN-ODT) disintegrating tablet 4 mg (4 mg Oral Given 01/10/24 1100)  acetaminophen (TYLENOL) 160 MG/5ML solution 650 mg (650 mg Oral Given 01/10/24 1151)    ED Course/ Medical Decision Making/ A&P Clinical Course as of 01/10/24 1241  Thu Jan 10, 2024  1008 SARS Coronavirus 2 by RT PCR(!): POSITIVE [TY]  1008 DG Chest 2 View IMPRESSION: No acute findings or explanation for cough.   [TY]  1218 Basic metabolic panel(!!) Appears that she has chronic hypokalemia with a potassium roughly around 3 at baseline.  Creatinine typically 2.25. [TY]    Clinical Course User Index [TY] Coral Spikes, DO                                 Medical Decision Making This is a 33 year old female presenting to the emergency department with viral syndrome type complaints.  Does have a history of familial adenomatous polyposis status post ileostomy, history of chronic hypokalemia and CKD.  She is afebrile nontachycardic, soft blood pressures, but appear to be at her baseline per chart review.  Symptoms consistent with viral  etiology.  She is COVID-positive.  Chest x-ray negative for acute pneumonia.  It also appears that she has recurrent infusions for intrinsic losses from ostomy.  Her most recent blood work available in Care Everywhere 12/21/2023 shows creatinine of 2.25.  Appears her potassium is typically around 3.  Unfortunately her labs are significantly worse currently with hyponatremia hypokalemia her creatinine is close to 4.  Magnesium normal.  Receiving IV fluids.  Given patient's complicated  past medical history with worsening renal function in the setting of her COVID will plan for admission at this time for further IV hydration.  Amount and/or Complexity of Data Reviewed External Data Reviewed:     Details: See above.  Labs: ordered. Decision-making details documented in ED Course. Radiology: ordered. Decision-making details documented in ED Course. Discussion of management or test interpretation with external provider(s): Discussed with hospitalist.   Risk OTC drugs. Prescription drug management. Decision regarding hospitalization.         Final Clinical Impression(s) / ED Diagnoses Final diagnoses:  AKI (acute kidney injury) (HCC)  Chronic hypokalemia  COVID-19    Rx / DC Orders ED Discharge Orders     None         Coral Spikes, DO 01/10/24 1241

## 2024-01-10 NOTE — ED Notes (Signed)
Called [facility] for transport, pt bed assignment is ready

## 2024-01-10 NOTE — Progress Notes (Unsigned)
{  Select_TRH_Note:[id number]}

## 2024-01-11 DIAGNOSIS — E8722 Chronic metabolic acidosis: Secondary | ICD-10-CM | POA: Diagnosis not present

## 2024-01-11 DIAGNOSIS — E871 Hypo-osmolality and hyponatremia: Secondary | ICD-10-CM | POA: Diagnosis not present

## 2024-01-11 DIAGNOSIS — E876 Hypokalemia: Secondary | ICD-10-CM | POA: Diagnosis not present

## 2024-01-11 DIAGNOSIS — N179 Acute kidney failure, unspecified: Secondary | ICD-10-CM | POA: Diagnosis not present

## 2024-01-11 LAB — MAGNESIUM
Magnesium: 1.8 mg/dL (ref 1.7–2.4)
Magnesium: 3 mg/dL — ABNORMAL HIGH (ref 1.7–2.4)

## 2024-01-11 LAB — BASIC METABOLIC PANEL
Anion gap: 12 (ref 5–15)
Anion gap: 7 (ref 5–15)
BUN: 35 mg/dL — ABNORMAL HIGH (ref 6–20)
BUN: 26 mg/dL — ABNORMAL HIGH (ref 6–20)
CO2: 19 mmol/L — ABNORMAL LOW (ref 22–32)
CO2: 17 mmol/L — ABNORMAL LOW (ref 22–32)
Calcium: 8 mg/dL — ABNORMAL LOW (ref 8.9–10.3)
Calcium: 7.3 mg/dL — ABNORMAL LOW (ref 8.9–10.3)
Chloride: 101 mmol/L (ref 98–111)
Chloride: 107 mmol/L (ref 98–111)
Creatinine, Ser: 2.06 mg/dL — ABNORMAL HIGH (ref 0.44–1.00)
Creatinine, Ser: 2.28 mg/dL — ABNORMAL HIGH (ref 0.44–1.00)
GFR, Estimated: 32 mL/min — ABNORMAL LOW (ref >60)
GFR, Estimated: 28 mL/min — ABNORMAL LOW (ref >60)
Glucose, Bld: 110 mg/dL — ABNORMAL HIGH (ref 70–99)
Glucose, Bld: 101 mg/dL — ABNORMAL HIGH (ref 70–99)
Potassium: 2.3 mmol/L — CL (ref 3.5–5.1)
Potassium: 3.1 mmol/L — ABNORMAL LOW (ref 3.5–5.1)
Sodium: 132 mmol/L — ABNORMAL LOW (ref 135–145)
Sodium: 131 mmol/L — ABNORMAL LOW (ref 135–145)

## 2024-01-11 LAB — HEPATIC FUNCTION PANEL
ALT: 11 U/L (ref 0–44)
AST: 13 U/L — ABNORMAL LOW (ref 15–41)
Albumin: 3.3 g/dL — ABNORMAL LOW (ref 3.5–5.0)
Alkaline Phosphatase: 64 U/L (ref 38–126)
Bilirubin, Direct: <0.1 mg/dL (ref 0.0–0.2)
Total Bilirubin: 0.5 mg/dL (ref 0.0–1.2)
Total Protein: 6.3 g/dL — ABNORMAL LOW (ref 6.5–8.1)

## 2024-01-11 LAB — HIV ANTIBODY (ROUTINE TESTING W REFLEX): HIV Screen 4th Generation wRfx: NONREACTIVE

## 2024-01-11 LAB — CBC
HCT: 28.5 % — ABNORMAL LOW (ref 36.0–46.0)
Hemoglobin: 9.6 g/dL — ABNORMAL LOW (ref 12.0–15.0)
MCH: 28.8 pg (ref 26.0–34.0)
MCHC: 33.7 g/dL (ref 30.0–36.0)
MCV: 85.6 fL (ref 80.0–100.0)
Platelets: 249 10*3/uL (ref 150–400)
RBC: 3.33 MIL/uL — ABNORMAL LOW (ref 3.87–5.11)
RDW: 12.9 % (ref 11.5–15.5)
WBC: 7.5 10*3/uL (ref 4.0–10.5)
nRBC: 0 % (ref 0.0–0.2)

## 2024-01-11 MED ORDER — MAGNESIUM SULFATE 2 GM/50ML IV SOLN
2.0000 g | Freq: Once | INTRAVENOUS | Status: AC
  Administered 2024-01-11: 2 g via INTRAVENOUS
  Filled 2024-01-11: qty 50

## 2024-01-11 MED ORDER — HEPARIN SOD (PORK) LOCK FLUSH 100 UNIT/ML IV SOLN
500.0000 [IU] | INTRAVENOUS | Status: AC | PRN
  Administered 2024-01-11: 500 [IU]

## 2024-01-11 MED ORDER — SODIUM CHLORIDE 0.9% FLUSH: 10.0000 mL | INTRAVENOUS | Status: DC | PRN

## 2024-01-11 MED ORDER — CHLORHEXIDINE GLUCONATE CLOTH 2 % EX PADS
6.0000 | MEDICATED_PAD | Freq: Every day | CUTANEOUS | Status: DC
  Administered 2024-01-11: 6 via TOPICAL

## 2024-01-11 MED ORDER — LACTATED RINGERS IV BOLUS
500.0000 mL | Freq: Once | INTRAVENOUS | Status: AC
  Administered 2024-01-11: 500 mL via INTRAVENOUS

## 2024-01-11 MED ORDER — POTASSIUM CHLORIDE 20 MEQ PO PACK
40.0000 meq | PACK | Freq: Once | ORAL | Status: AC
  Administered 2024-01-11: 40 meq via ORAL
  Filled 2024-01-11: qty 2

## 2024-01-11 MED ORDER — POTASSIUM CHLORIDE 10 MEQ/100ML IV SOLN
10.0000 meq | INTRAVENOUS | Status: AC
  Administered 2024-01-11 (×6): 10 meq via INTRAVENOUS
  Filled 2024-01-11 (×6): qty 100

## 2024-01-11 MED ORDER — POTASSIUM CHLORIDE CRYS ER 20 MEQ PO TBCR: 20.0000 meq | EXTENDED_RELEASE_TABLET | Freq: Two times a day (BID) | ORAL | 0 refills | Status: DC

## 2024-01-11 NOTE — Discharge Summary (Signed)
[facility] Physician Discharge Summary   Patient name: Cindy [name]  Admit date:     01/10/2024  Discharge date: 01/11/2024  Attending Physician: Maryln Gottron [[id number]]  Discharge Physician: Carollee Herter   PCP: Ovidio Kin, MD  Admitted From: Home  Disposition:  Home  Recommendations for Outpatient Follow-up:  Follow up with PCP in 1-2 weeks Follow up with [facility] as scheduled  Home Health:No Equipment/Devices: None  Discharge Condition:Stable CODE STATUS:FULL Diet recommendation: Renal Fluid Restriction: None  Hospital Summary: HPI: Cindy [name] is a 33 y.o. 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   Patient developed sore throat the night of 01/07/2024.  The following day, she had nonproductive cough, generalized aches, and severe fatigue.  She has also had loss of appetite.  Fatigue and aches have worsened significantly over the past day.  There is no chest pain, abdominal pain, or change in her ostomy output.   She typically receives IV fluids via port in her right chest every Friday.  She is followed by nephrology at [facility].   [facility] ED Course: Upon arrival to the ED, patient is found to be afebrile and saturating in the 90s on room air with normal RR, normal HR, and systolic blood pressure ranging from mid 70s to mid 90s.  Labs are most below for sodium 129, potassium 2.7, bicarbonate 15, BUN 38, creatinine 3.97, anion gap 18, normal WBC, normal hemoglobin, and positive COVID-19 PCR.   Patient was treated with 1 L of LR, acetaminophen, Zofran, and IV potassium. She was transferred to [facility] for admission.  Significant Events: Admitted 01/10/2024 for AKI on CKD stage  4   Significant Labs: Covid 19 POSITIVE Na 129, K 2.7, CO2 of 15, BUN 38, Scr 3.97 WBC 8.2, HgB 12.4, Plt 329  Significant Imaging Studies: CXR No acute findings or explanation for cough.   Antibiotic Therapy: Anti-infectives (From admission, onward)    None       Procedures:   Consultants:   Hospital Course by Problem: * Acute renal failure superimposed on stage 4 chronic kidney disease (HCC) 01-11-2024 Scr down to 2.06. her baseline Scr between 2.5-3.0. follows with Dr. Ricci Barker with [facility]. Gets weekly IVF in nephrology clinic.  Hypokalemia 01-11-2024 chronic hypokalemia. Repeat BMP today at 3 pm.  Her hypokalemia is a known regular issue for her. She is not kept in the hospital when her K is as low as 2.8 in clinic.  *update after IV KCL and po kcl. Repeat serum K is 3.1. she is stable for DC.  Chronic metabolic acidosis 01-11-2024 chronic. Per Dr. Ricci Barker from office visit 06-25-2023 "#Acidemia remains a significant problem. The patient takes a large amount of bicarbonate orally, and I do not think increasing it will improve serum bicarbonate levels."  COVID-19 virus infection 01-11-2024 pretty asymptomatic. Afebrile. On RA.  Chronic hyponatremia - baseline Na 130 01-11-2024 Na up to 132 after IVF. Chronic known issue for her. Follows with nephrology Dr. Ricci Barker with [facility].  Hypotension due to hypovolemia 01-11-2024 chronic. Office SBP usually around 90-100s. Known issue for her. Probably reason she gets IVF in clinic every week.   Discharge Diagnoses:  Principal Problem:   Acute renal failure superimposed on stage 4 chronic kidney disease (HCC) Active Problems:  Hypokalemia   Hypotension due to hypovolemia   Chronic hyponatremia - baseline Na 130   COVID-19 virus infection   Chronic metabolic acidosis   Discharge Instructions  Discharge Instructions     Call MD for:  difficulty breathing, headache or visual disturbances   Complete  by: As directed    Call MD for:  extreme fatigue   Complete by: As directed    Call MD for:  hives   Complete by: As directed    Call MD for:  persistant dizziness or light-headedness   Complete by: As directed    Call MD for:  persistant nausea and vomiting   Complete by: As directed    Call MD for:  redness, tenderness, or signs of infection (pain, swelling, redness, odor or green/yellow discharge around incision site)   Complete by: As directed    Call MD for:  severe uncontrolled pain   Complete by: As directed    Call MD for:  temperature >100.4   Complete by: As directed    Diet - low sodium heart healthy   Complete by: As directed    Discharge instructions   Complete by: As directed    1. Follow up with [facility] Dr. Ricci Barker as scheduled. 2. Follow up with your primary care provider in 1-2 weeks following discharge   Increase activity slowly   Complete by: As directed       Allergies as of 01/11/2024       Reactions   Citrus Hives   Food Anaphylaxis, Other (See Comments)   Orange juice   Iron Palpitations, Other (See Comments)   Heart stops  **pt has tolerated IV Fe**   Iron Sucrose Anaphylaxis   She had anaphylaxis to venofier but was given ferrumoxytol in allergy clinic by Dr. Silverio Decamp and tolerated well in 2020   Vancomycin Anaphylaxis   Venofer [ferric Oxide] Anaphylaxis   Soap Hives, Itching, Other (See Comments)   Dove soap   Sulfamethoxazole-trimethoprim Nausea And Vomiting, Other (See Comments)   Patient developed AKI that appears to have been from bactrim. She might be able to take again if watched closely and it is absolutely needed - ie there was no anaphylaxis or allergic rash.   Levaquin [levofloxacin] Hives   Morphine Hives, Swelling, Other (See Comments)   When given IV, swelling around site   Sulfa Antibiotics Other (See Comments)   Dehydration        Medication List     STOP taking these medications    cyanocobalamin 1000 MCG/ML  injection Commonly known as: VITAMIN B12   famotidine 40 MG/5ML suspension Commonly known as: PEPCID   pantoprazole 40 MG tablet Commonly known as: Protonix       TAKE these medications    Imodium A-D 1 MG/7.5ML solution Generic drug: loperamide HCl Take 2 mg by mouth 3 (three) times daily.   ondansetron 4 MG disintegrating tablet Commonly known as: Zofran ODT Take 1 tablet (4 mg total) by mouth every 6 (six) hours as needed for nausea or vomiting. What changed: Another medication with the same name was removed. Continue taking this medication, and follow the directions you see here.   potassium chloride SA 20 MEQ tablet Commonly known as: KLOR-CON M Take 1 tablet (20 mEq total) by mouth 2 (two) times daily.   predniSONE 10 MG (21) Tbpk tablet Commonly known as: STERAPRED UNI-PAK 21 TAB SMARTSIG:- Tablet(s) By Mouth -   saccharomyces boulardii 250 MG capsule Commonly known as: [employer]  Take 1 capsule (250 mg total) by mouth 2 (two) times daily. Any generic probiotic is okay What changed:  when to take this additional instructions        Allergies  Allergen Reactions   Citrus Hives   Food Anaphylaxis and Other (See Comments)    Orange juice   Iron Palpitations and Other (See Comments)    Heart stops  **pt has tolerated IV Fe**   Iron Sucrose Anaphylaxis    She had anaphylaxis to venofier but was given ferrumoxytol in allergy clinic by Dr. Silverio Decamp and tolerated well in 2020   Vancomycin Anaphylaxis   Venofer [Ferric Oxide] Anaphylaxis   Soap Hives, Itching and Other (See Comments)    Dove soap   Sulfamethoxazole-Trimethoprim Nausea And Vomiting and Other (See Comments)    Patient developed AKI that appears to have been from bactrim. She might be able to take again if watched closely and it is absolutely needed - ie there was no anaphylaxis or allergic rash.   Levaquin [Levofloxacin] Hives   Morphine Hives, Swelling and Other (See Comments)    When given  IV, swelling around site   Sulfa Antibiotics Other (See Comments)    Dehydration     Discharge Exam: Vitals:   01/11/24 0741 01/11/24 1306  BP: (!) 90/59 93/65  Pulse:  78  Resp:  20  Temp:  97.8 F (36.6 C)  SpO2:  96%    Physical Exam Vitals and nursing note reviewed.  Constitutional:      General: She is not in acute distress.    Appearance: She is not toxic-appearing or diaphoretic.  HENT:     Head: Normocephalic and atraumatic.     Nose: Nose normal.  Eyes:     General: No scleral icterus. Cardiovascular:     Rate and Rhythm: Normal rate and regular rhythm.  Pulmonary:     Effort: Pulmonary effort is normal. No respiratory distress.     Breath sounds: Normal breath sounds.  Abdominal:     General: Abdomen is flat. Bowel sounds are normal.     Palpations: Abdomen is soft.     Tenderness: There is no abdominal tenderness.     Comments: Ostomy RLQ. Brown stool  Musculoskeletal:     Right lower leg: No edema.     Left lower leg: No edema.  Skin:    General: Skin is warm and dry.     Capillary Refill: Capillary refill takes less than 2 seconds.  Neurological:     Mental Status: She is alert and oriented to person, place, and time.    The results of significant diagnostics from this hospitalization (including imaging, microbiology, ancillary and laboratory) are listed below for reference.    Microbiology: Recent Results (from the past 240 hours)  Resp panel by RT-PCR (RSV, Flu A&B, Covid) Anterior Nasal Swab     Status: Abnormal   Collection Time: 01/10/24  9:10 AM   Specimen: Anterior Nasal Swab  Result Value Ref Range Status   SARS Coronavirus 2 by RT PCR POSITIVE (A) NEGATIVE Final    Comment: (NOTE) SARS-CoV-2 target nucleic acids are DETECTED.  The SARS-CoV-2 RNA is generally detectable in upper respiratory specimens during the acute phase of infection. Positive results are indicative of the presence of the identified virus, but do not rule out  bacterial infection or co-infection with other pathogens not detected by the test. Clinical correlation with patient history and other diagnostic information is necessary to determine patient infection status.  The expected result is Negative.  Fact Sheet for Patients: BloggerCourse.com  Fact Sheet for Healthcare Providers: SeriousBroker.it  This test is not yet approved or cleared by the [place] FDA and  has been authorized for detection and/or diagnosis of SARS-CoV-2 by FDA under an Emergency Use Authorization (EUA).  This EUA will remain in effect (meaning this test can be used) for the duration of  the COVID-19 declaration under Section 564(b)(1) of the A ct, 21 U.S.C. section 360bbb-3(b)(1), unless the authorization is terminated or revoked sooner.     Influenza A by PCR NEGATIVE NEGATIVE Final   Influenza B by PCR NEGATIVE NEGATIVE Final    Comment: (NOTE) The Xpert Xpress SARS-CoV-2/FLU/RSV plus assay is intended as an aid in the diagnosis of influenza from Nasopharyngeal swab specimens and should not be used as a sole basis for treatment. Nasal washings and aspirates are unacceptable for Xpert Xpress SARS-CoV-2/FLU/RSV testing.  Fact Sheet for Patients: BloggerCourse.com  Fact Sheet for Healthcare Providers: SeriousBroker.it  This test is not yet approved or cleared by the [place] FDA and has been authorized for detection and/or diagnosis of SARS-CoV-2 by FDA under an Emergency Use Authorization (EUA). This EUA will remain in effect (meaning this test can be used) for the duration of the COVID-19 declaration under Section 564(b)(1) of the Act, 21 U.S.C. section 360bbb-3(b)(1), unless the authorization is terminated or revoked.     Resp Syncytial Virus by PCR NEGATIVE NEGATIVE Final    Comment: (NOTE) Fact Sheet for  Patients: BloggerCourse.com  Fact Sheet for Healthcare Providers: SeriousBroker.it  This test is not yet approved or cleared by the [place] FDA and has been authorized for detection and/or diagnosis of SARS-CoV-2 by FDA under an Emergency Use Authorization (EUA). This EUA will remain in effect (meaning this test can be used) for the duration of the COVID-19 declaration under Section 564(b)(1) of the Act, 21 U.S.C. section 360bbb-3(b)(1), unless the authorization is terminated or revoked.  Performed at [facility], [address] [place], [place] [address]    Labs:  Basic Metabolic Panel: Recent Labs  Lab 01/10/24 1140 01/11/24 0344 01/11/24 1558  NA 129* 132* 131*  K 2.7* 2.3* 3.1*  CL 96* 101 107  CO2 15* 19* 17*  GLUCOSE 96 110* 101*  BUN 38* 35* 26*  CREATININE 3.97* 2.06* 2.28*  CALCIUM 8.5* 8.0* 7.3*  MG 1.7 1.8 3.0*   Liver Function Tests: Recent Labs  Lab 01/11/24 0344  AST 13*  ALT 11  ALKPHOS 64  BILITOT 0.5  PROT 6.3*  ALBUMIN 3.3*   CBC: Recent Labs  Lab 01/10/24 1140 01/11/24 0344  WBC 8.2 7.5  HGB 12.4 9.6*  HCT 36.7 28.5*  MCV 82.3 85.6  PLT 329 249   Sepsis Labs Recent Labs  Lab 01/10/24 1140 01/11/24 0344  WBC 8.2 7.5   Procedures/Studies: DG Chest 2 View Result Date: 01/10/2024 CLINICAL DATA:  Cough, weakness and fever. EXAM: CHEST - 2 VIEW COMPARISON:  08/16/2020 FINDINGS: Right chest port with tip in the SVC.The cardiomediastinal contours are normal. The lungs are clear. Pulmonary vasculature is normal. No consolidation, pleural effusion, or pneumothorax. No acute osseous abnormalities are seen. IMPRESSION: No acute findings or explanation for cough. Electronically Signed   By: Narda Rutherford M.D.   On: 01/10/2024 09:49    Time coordinating discharge: 40 mins  SIGNED:  Carollee Herter, DO [facility] 01/11/24, 4:33 PM

## 2024-01-11 NOTE — Plan of Care (Signed)
Problem: Education: Goal: Knowledge of risk factors and measures for prevention of condition will improve Outcome: Progressing   Problem: Coping: Goal: Psychosocial and spiritual needs will be supported Outcome: Progressing   Problem: Respiratory: Goal: Will maintain a patent airway Outcome: Progressing Goal: Complications related to the disease process, condition or treatment will be avoided or minimized Outcome: Progressing

## 2024-01-11 NOTE — Hospital Course (Signed)
HPI: Cindy [name] is a 33 y.o. 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   Patient developed sore throat the night of 01/07/2024.  The following day, she had nonproductive cough, generalized aches, and severe fatigue.  She has also had loss of appetite.  Fatigue and aches have worsened significantly over the past day.  There is no chest pain, abdominal pain, or change in her ostomy output.   She typically receives IV fluids via port in her right chest every Friday.  She is followed by nephrology at [facility].   [facility] ED Course: Upon arrival to the ED, patient is found to be afebrile and saturating in the 90s on room air with normal RR, normal HR, and systolic blood pressure ranging from mid 70s to mid 90s.  Labs are most below for sodium 129, potassium 2.7, bicarbonate 15, BUN 38, creatinine 3.97, anion gap 18, normal WBC, normal hemoglobin, and positive COVID-19 PCR.   Patient was treated with 1 L of LR, acetaminophen, Zofran, and IV potassium. She was transferred to [facility] for admission.  Significant Events: Admitted 01/10/2024 for AKI on CKD stage 4   Significant Labs: Covid 19 POSITIVE Na 129, K 2.7, CO2 of 15, BUN 38, Scr 3.97 WBC 8.2, HgB 12.4, Plt 329  Significant Imaging Studies: CXR No acute findings or explanation for cough.   Antibiotic Therapy: Anti-infectives (From admission, onward)    None       Procedures:   Consultants:

## 2024-01-11 NOTE — Assessment & Plan Note (Signed)
01-11-2024 Scr down to 2.06. her baseline Scr between 2.5-3.0. follows with Dr. Ricci Barker with [facility]. Gets weekly IVF in nephrology clinic.

## 2024-01-11 NOTE — Assessment & Plan Note (Addendum)
01-11-2024 chronic. Per Dr. Ricci Barker from office visit 06-25-2023 "#Acidemia remains a significant problem. The patient takes a large amount of bicarbonate orally, and I do not think increasing it will improve serum bicarbonate levels."

## 2024-01-11 NOTE — Assessment & Plan Note (Addendum)
01-11-2024 Na up to 132 after IVF. Chronic known issue for her. Follows with nephrology Dr. Ricci Barker with [facility].

## 2024-01-11 NOTE — Assessment & Plan Note (Addendum)
01-11-2024 chronic hypokalemia. Repeat BMP today at 3 pm.  Her hypokalemia is a known regular issue for her. She is not kept in the hospital when her K is as low as 2.8 in clinic.  *update after IV KCL and po kcl. Repeat serum K is 3.1. she is stable for DC.

## 2024-01-11 NOTE — Assessment & Plan Note (Signed)
01-11-2024 pretty asymptomatic. Afebrile. On RA.

## 2024-01-11 NOTE — Subjective & Objective (Addendum)
Pt seen and examined. Only slight cough. Had colectomy about 10 years ago for familial polyposis. No vomiting. Ate breakfast. No nausea. Had port-a-cath. Getting IV KCL. Renal function has improved overnight as well has hyponatremia Pt sees nephrology at [facility]. Dr Ricci Barker. With hx of chronic hypokalemia.  She has chronic metabolic acidosis that Dr. Ricci Barker is aware of.  Appears she gets IVF in nephrology clinic about every 1 week.

## 2024-01-11 NOTE — Progress Notes (Signed)
PROGRESS NOTE    Cindy [name]  UJW:[id number] DOB: Jul 20, 1991 DOA: 01/10/2024 PCP: Ovidio Kin, MD  Subjective: Pt seen and examined. Only slight cough. Had colectomy about 10 years ago for familial polyposis. No vomiting. Ate breakfast. No nausea. Had port-a-cath. Getting IV KCL. Renal function has improved overnight as well has hyponatremia Pt sees nephrology at [facility]. Dr Ricci Barker. With hx of chronic hypokalemia.  She has chronic metabolic acidosis that Dr. Ricci Barker is aware of.  Appears she gets IVF in nephrology clinic about every 1 week.   Hospital Course: HPI: Cindy [name] is a 33 y.o. 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   Patient developed sore throat the night of 01/07/2024.  The following day, she had nonproductive cough, generalized aches, and severe fatigue.  She has also had loss of appetite.  Fatigue and aches have worsened significantly over the past day.  There is no chest pain, abdominal pain, or change in her ostomy output.   She typically receives IV fluids via port in her right chest every Friday.  She is followed by nephrology at [facility].   [facility] ED Course: Upon arrival to the ED, patient is found to be afebrile and saturating in the 90s on room air with normal RR, normal HR, and systolic blood pressure ranging from mid 70s to mid 90s.  Labs are most below for sodium 129, potassium 2.7, bicarbonate 15, BUN 38, creatinine 3.97, anion gap 18, normal WBC, normal hemoglobin, and positive COVID-19 PCR.   Patient was treated with 1 L of LR, acetaminophen, Zofran, and IV potassium. She was transferred to [facility] for admission.  Significant Events: Admitted 01/10/2024 for AKI on CKD stage  4   Significant Labs: Covid 19 POSITIVE Na 129, K 2.7, CO2 of 15, BUN 38, Scr 3.97 WBC 8.2, HgB 12.4, Plt 329  Significant Imaging Studies: CXR No acute findings or explanation for cough.   Antibiotic Therapy: Anti-infectives (From admission, onward)    None       Procedures:   Consultants:     Assessment and Plan: * Acute renal failure superimposed on stage 4 chronic kidney disease (HCC) 01-11-2024 Scr down to 2.06. her baseline Scr between 2.5-3.0. follows with Dr. Ricci Barker with [facility] nephrology. Gets weekly IVF in nephrology clinic.  Hypokalemia 01-11-2024 chronic hypokalemia. Repeat BMP today at 3 pm.  Her hypokalemia is a known regular issue for her. She is not kept in the hospital when her K is as low as 2.8 in clinic.  Chronic metabolic acidosis 01-11-2024 chronic. Per Dr. Ricci Barker from office visit 06-25-2023 "#Acidemia remains a significant problem. The patient takes a large amount of bicarbonate orally, and I do not think increasing it will improve serum bicarbonate levels."  COVID-19 virus infection 01-11-2024 pretty asymptomatic. Afebrile. On RA.  Chronic hyponatremia - baseline Na 130 01-11-2024 Na up to 132 after IVF. Chronic known issue for her. Follows with nephrology Dr. Ricci Barker with [facility].  Hypotension due to hypovolemia 01-11-2024 chronic. Office SBP usually around 90-100s. Known issue for her. Probably reason she gets IVF in clinic every week.  DVT prophylaxis: heparin injection 5,000 Units Start: 01/10/24 2200    Code Status: Full Code Family Communication: no family at bedside. Pt is decisional. Disposition Plan: return home Reason for  continuing need for hospitalization: may be stable for DC later today.  Objective: Vitals:   01/11/24 0058 01/11/24 0453 01/11/24 0739 01/11/24 0741  BP: 91/62 (!) 86/56 (!) 83/55 (!) 90/59  Pulse: 85 74    Resp:  17    Temp:  98.4 F (36.9 C) 97.8 F (36.6 C)   TempSrc:  Oral Oral   SpO2:  100%  100%   Weight:      Height:        Intake/Output Summary (Last 24 hours) at 01/11/2024 1211 Last data filed at 01/11/2024 0858 Gross per 24 hour  Intake 2481.94 ml  Output --  Net 2481.94 ml   Filed Weights   01/10/24 0906 01/10/24 1908  Weight: 49.9 kg 49.8 kg   Examination:  Physical Exam Vitals and nursing note reviewed.  Constitutional:      General: She is not in acute distress.    Appearance: She is not toxic-appearing or diaphoretic.  HENT:     Head: Normocephalic and atraumatic.     Nose: Nose normal.  Eyes:     General: No scleral icterus. Cardiovascular:     Rate and Rhythm: Normal rate and regular rhythm.  Pulmonary:     Effort: Pulmonary effort is normal. No respiratory distress.     Breath sounds: Normal breath sounds.  Abdominal:     General: Abdomen is flat. Bowel sounds are normal.     Palpations: Abdomen is soft.     Tenderness: There is no abdominal tenderness.     Comments: Ostomy RLQ. Brown stool  Musculoskeletal:     Right lower leg: No edema.     Left lower leg: No edema.  Skin:    General: Skin is warm and dry.     Capillary Refill: Capillary refill takes less than 2 seconds.  Neurological:     Mental Status: She is alert and oriented to person, place, and time.   Data Reviewed: I have personally reviewed following labs and imaging studies  CBC: Recent Labs  Lab 01/10/24 1140 01/11/24 0344  WBC 8.2 7.5  HGB 12.4 9.6*  HCT 36.7 28.5*  MCV 82.3 85.6  PLT 329 249   Basic Metabolic Panel: Recent Labs  Lab 01/10/24 1140 01/11/24 0344  NA 129* 132*  K 2.7* 2.3*  CL 96* 101  CO2 15* 19*  GLUCOSE 96 110*  BUN 38* 35*  CREATININE 3.97* 2.06*  CALCIUM 8.5* 8.0*  MG 1.7 1.8   GFR: Estimated Creatinine Clearance: 27.6 mL/min (A) (by C-G formula based on SCr of 2.06 mg/dL (H)). Liver Function Tests: Recent Labs  Lab 01/11/24 0344  AST 13*  ALT 11  ALKPHOS 64  BILITOT 0.5  PROT 6.3*  ALBUMIN 3.3*    Recent Results (from  the past 240 hours)  Resp panel by RT-PCR (RSV, Flu A&B, Covid) Anterior Nasal Swab     Status: Abnormal   Collection Time: 01/10/24  9:10 AM   Specimen: Anterior Nasal Swab  Result Value Ref Range Status   SARS Coronavirus 2 by RT PCR POSITIVE (A) NEGATIVE Final    Comment: (NOTE) SARS-CoV-2 target nucleic acids are DETECTED.  The SARS-CoV-2 RNA is generally detectable in upper respiratory specimens during the acute phase of infection. Positive results are indicative of the presence of the identified virus, but do not rule out bacterial infection or co-infection with other pathogens not detected by the test. Clinical correlation with patient history and other diagnostic information is necessary to determine patient  infection status. The expected result is Negative.  Fact Sheet for Patients: BloggerCourse.com  Fact Sheet for Healthcare Providers: SeriousBroker.it  This test is not yet approved or cleared by the [place] FDA and  has been authorized for detection and/or diagnosis of SARS-CoV-2 by FDA under an Emergency Use Authorization (EUA).  This EUA will remain in effect (meaning this test can be used) for the duration of  the COVID-19 declaration under Section 564(b)(1) of the A ct, 21 U.S.C. section 360bbb-3(b)(1), unless the authorization is terminated or revoked sooner.     Influenza A by PCR NEGATIVE NEGATIVE Final   Influenza B by PCR NEGATIVE NEGATIVE Final    Comment: (NOTE) The Xpert Xpress SARS-CoV-2/FLU/RSV plus assay is intended as an aid in the diagnosis of influenza from Nasopharyngeal swab specimens and should not be used as a sole basis for treatment. Nasal washings and aspirates are unacceptable for Xpert Xpress SARS-CoV-2/FLU/RSV testing.  Fact Sheet for Patients: BloggerCourse.com  Fact Sheet for Healthcare Providers: SeriousBroker.it  This test  is not yet approved or cleared by the [place] FDA and has been authorized for detection and/or diagnosis of SARS-CoV-2 by FDA under an Emergency Use Authorization (EUA). This EUA will remain in effect (meaning this test can be used) for the duration of the COVID-19 declaration under Section 564(b)(1) of the Act, 21 U.S.C. section 360bbb-3(b)(1), unless the authorization is terminated or revoked.     Resp Syncytial Virus by PCR NEGATIVE NEGATIVE Final    Comment: (NOTE) Fact Sheet for Patients: BloggerCourse.com  Fact Sheet for Healthcare Providers: SeriousBroker.it  This test is not yet approved or cleared by the [place] FDA and has been authorized for detection and/or diagnosis of SARS-CoV-2 by FDA under an Emergency Use Authorization (EUA). This EUA will remain in effect (meaning this test can be used) for the duration of the COVID-19 declaration under Section 564(b)(1) of the Act, 21 U.S.C. section 360bbb-3(b)(1), unless the authorization is terminated or revoked.  Performed at [facility], [address], [place], [place] [address]      Radiology Studies: DG Chest 2 View Result Date: 01/10/2024 CLINICAL DATA:  Cough, weakness and fever. EXAM: CHEST - 2 VIEW COMPARISON:  08/16/2020 FINDINGS: Right chest port with tip in the SVC.The cardiomediastinal contours are normal. The lungs are clear. Pulmonary vasculature is normal. No consolidation, pleural effusion, or pneumothorax. No acute osseous abnormalities are seen. IMPRESSION: No acute findings or explanation for cough. Electronically Signed   By: Narda Rutherford M.D.   On: 01/10/2024 09:49    Scheduled Meds:  Chlorhexidine Gluconate Cloth  6 each Topical Daily   heparin  5,000 Units Subcutaneous Q8H   pantoprazole  40 mg Oral Daily   potassium chloride  40 mEq Oral Once   sodium chloride flush  3 mL Intravenous Q12H   Continuous  Infusions:  sodium chloride 10 mL/hr at 01/10/24 1251   potassium chloride 10 mEq (01/11/24 1117)     LOS: 0 days   Time spent: 40 minutes  Carollee Herter, DO  [facility]  01/11/2024, 12:11 PM

## 2024-01-11 NOTE — Assessment & Plan Note (Signed)
01-11-2024 chronic. Office SBP usually around 90-100s. Known issue for her. Probably reason she gets IVF in clinic every week.

## 2024-01-28 ENCOUNTER — Emergency Department (HOSPITAL_COMMUNITY): Payer: PRIVATE HEALTH INSURANCE

## 2024-01-28 ENCOUNTER — Inpatient Hospital Stay (HOSPITAL_COMMUNITY)
Admission: EM | Admit: 2024-01-28 | Discharge: 2024-01-29 | DRG: 683 | Disposition: A | Discharge status: Home / Self Care | Payer: PRIVATE HEALTH INSURANCE | Attending: Internal Medicine | Admitting: Internal Medicine

## 2024-01-28 ENCOUNTER — Other Ambulatory Visit: Payer: Self-pay

## 2024-01-28 ENCOUNTER — Encounter (HOSPITAL_COMMUNITY): Payer: Self-pay

## 2024-01-28 DIAGNOSIS — Z820 Family history of epilepsy and other diseases of the nervous system: Secondary | ICD-10-CM

## 2024-01-28 DIAGNOSIS — Z885 Allergy status to narcotic agent status: Secondary | ICD-10-CM

## 2024-01-28 DIAGNOSIS — N179 Acute kidney failure, unspecified: Secondary | ICD-10-CM | POA: Diagnosis not present

## 2024-01-28 DIAGNOSIS — Z79899 Other long term (current) drug therapy: Secondary | ICD-10-CM

## 2024-01-28 DIAGNOSIS — Z91048 Other nonmedicinal substance allergy status: Secondary | ICD-10-CM

## 2024-01-28 DIAGNOSIS — Z882 Allergy status to sulfonamides status: Secondary | ICD-10-CM

## 2024-01-28 DIAGNOSIS — D1391 Familial adenomatous polyposis: Secondary | ICD-10-CM | POA: Diagnosis present

## 2024-01-28 DIAGNOSIS — N184 Chronic kidney disease, stage 4 (severe): Secondary | ICD-10-CM | POA: Diagnosis present

## 2024-01-28 DIAGNOSIS — Z91018 Allergy to other foods: Secondary | ICD-10-CM

## 2024-01-28 DIAGNOSIS — I959 Hypotension, unspecified: Secondary | ICD-10-CM | POA: Diagnosis present

## 2024-01-28 DIAGNOSIS — Z9049 Acquired absence of other specified parts of digestive tract: Secondary | ICD-10-CM

## 2024-01-28 DIAGNOSIS — Z932 Ileostomy status: Secondary | ICD-10-CM

## 2024-01-28 DIAGNOSIS — E871 Hypo-osmolality and hyponatremia: Secondary | ICD-10-CM | POA: Diagnosis present

## 2024-01-28 DIAGNOSIS — E861 Hypovolemia: Secondary | ICD-10-CM | POA: Diagnosis present

## 2024-01-28 DIAGNOSIS — Z888 Allergy status to other drugs, medicaments and biological substances status: Secondary | ICD-10-CM

## 2024-01-28 DIAGNOSIS — E872 Acidosis, unspecified: Secondary | ICD-10-CM | POA: Diagnosis present

## 2024-01-28 DIAGNOSIS — Z881 Allergy status to other antibiotic agents status: Secondary | ICD-10-CM

## 2024-01-28 DIAGNOSIS — K90822 Short bowel syndrome without colon in continuity: Secondary | ICD-10-CM | POA: Diagnosis present

## 2024-01-28 DIAGNOSIS — E876 Hypokalemia: Secondary | ICD-10-CM | POA: Diagnosis not present

## 2024-01-28 DIAGNOSIS — Z85038 Personal history of other malignant neoplasm of large intestine: Secondary | ICD-10-CM

## 2024-01-28 DIAGNOSIS — E860 Dehydration: Secondary | ICD-10-CM | POA: Diagnosis present

## 2024-01-28 LAB — URINALYSIS, ROUTINE W REFLEX MICROSCOPIC
Bilirubin Urine: NEGATIVE
Glucose, UA: NEGATIVE mg/dL
Hgb urine dipstick: NEGATIVE
Ketones, ur: NEGATIVE mg/dL
Leukocytes,Ua: NEGATIVE
Nitrite: NEGATIVE
Protein, ur: NEGATIVE mg/dL
Specific Gravity, Urine: 1.011 (ref 1.005–1.030)
pH: 5 (ref 5.0–8.0)

## 2024-01-28 LAB — COMPREHENSIVE METABOLIC PANEL
ALT: 10 U/L (ref 0–44)
AST: 13 U/L — ABNORMAL LOW (ref 15–41)
Albumin: 3.9 g/dL (ref 3.5–5.0)
Alkaline Phosphatase: 81 U/L (ref 38–126)
Anion gap: 13 (ref 5–15)
BUN: 44 mg/dL — ABNORMAL HIGH (ref 6–20)
CO2: 17 mmol/L — ABNORMAL LOW (ref 22–32)
Calcium: 7.3 mg/dL — ABNORMAL LOW (ref 8.9–10.3)
Chloride: 102 mmol/L (ref 98–111)
Creatinine, Ser: 3.98 mg/dL — ABNORMAL HIGH (ref 0.44–1.00)
GFR, Estimated: 14 mL/min — ABNORMAL LOW (ref >60)
Glucose, Bld: 83 mg/dL (ref 70–99)
Potassium: 2.8 mmol/L — ABNORMAL LOW (ref 3.5–5.1)
Sodium: 132 mmol/L — ABNORMAL LOW (ref 135–145)
Total Bilirubin: 0.6 mg/dL (ref 0.0–1.2)
Total Protein: 7.2 g/dL (ref 6.5–8.1)

## 2024-01-28 LAB — CBC
HCT: 33.5 % — ABNORMAL LOW (ref 36.0–46.0)
Hemoglobin: 11 g/dL — ABNORMAL LOW (ref 12.0–15.0)
MCH: 28.6 pg (ref 26.0–34.0)
MCHC: 32.8 g/dL (ref 30.0–36.0)
MCV: 87 fL (ref 80.0–100.0)
Platelets: 326 10*3/uL (ref 150–400)
RBC: 3.85 MIL/uL — ABNORMAL LOW (ref 3.87–5.11)
RDW: 12.8 % (ref 11.5–15.5)
WBC: 12.5 10*3/uL — ABNORMAL HIGH (ref 4.0–10.5)
nRBC: 0 % (ref 0.0–0.2)

## 2024-01-28 LAB — HCG, SERUM, QUALITATIVE: Preg, Serum: NEGATIVE

## 2024-01-28 LAB — LIPASE, BLOOD: Lipase: 68 U/L — ABNORMAL HIGH (ref 11–51)

## 2024-01-28 MED ORDER — OXYCODONE-ACETAMINOPHEN 5-325 MG PO TABS
1.0000 | ORAL_TABLET | Freq: Once | ORAL | Status: AC
  Administered 2024-01-28: 1 via ORAL
  Filled 2024-01-28: qty 1

## 2024-01-28 MED ORDER — SODIUM CHLORIDE 0.9 % IV BOLUS
1000.0000 mL | Freq: Once | INTRAVENOUS | Status: AC
  Administered 2024-01-28: 1000 mL via INTRAVENOUS

## 2024-01-28 MED ORDER — FENTANYL CITRATE PF 50 MCG/ML IJ SOSY
50.0000 ug | PREFILLED_SYRINGE | Freq: Once | INTRAMUSCULAR | Status: AC
  Administered 2024-01-28: 50 ug via INTRAVENOUS
  Filled 2024-01-28: qty 1

## 2024-01-28 MED ORDER — POTASSIUM CHLORIDE 20 MEQ PO PACK
40.0000 meq | PACK | Freq: Once | ORAL | Status: AC
  Administered 2024-01-28: 40 meq via ORAL
  Filled 2024-01-28: qty 2

## 2024-01-28 NOTE — ED Provider Triage Note (Signed)
 Emergency Medicine Provider Triage Evaluation Note  Cindy [name] , a 33 y.o. female  was evaluated in triage.  Pt complains of abdominal pain.  Located all over the abdomen.  Started about 2 days ago.  Also reports increased output from ileostomy and decreased urinary output in last 24 hours.  Denies pain with urination.  Denies fever.  Denies nausea and vomiting.    Review of Systems  Positive: See above Negative: See above  Physical Exam  BP (!) 100/59 (BP Location: Left Arm)   Pulse (!) 102   Temp 97.9 F (36.6 C) (Oral)   Resp 16   Ht 5' (1.524 m)   Wt 49.9 kg   SpO2 100%   BMI 21.48 kg/m  Gen:   Awake, no distress   Resp:  Normal effort  MSK:   Moves extremities without difficulty  Other:    Medical Decision Making  Medically screening exam initiated at 7:29 PM.  Appropriate orders placed.  Cindy [name] was informed that the remainder of the evaluation will be completed by another provider, this initial triage assessment does not replace that evaluation, and the importance of remaining in the ED until their evaluation is complete.  Work up started   Gareth Eagle, [place] 01/28/24 1930

## 2024-01-28 NOTE — ED Triage Notes (Signed)
 Pt states that she has had increased output from her ileostomy and has had decreased urinary output x24 hours. ABD and back pain.

## 2024-01-28 NOTE — ED Provider Notes (Incomplete)
 Cindy [name]   CSN: [id number] Arrival date & time: 01/28/24  1832     History {Add pertinent medical, surgical, social history, OB history to HPI:1} Chief Complaint  Patient presents with   Abdominal Pain   Dehydration    Cindy [name] is a 33 y.o. 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  Also reports decreased urinary output.  States she feels like she may be dehydrated.  She reports mild abdominal pain.  No nausea or vomiting.  No fevers or chills.  She states her blood pressures normally run on the lower side with systolics of 90s.   Abdominal Pain      Home Medications Prior to Admission medications   Medication Sig Start Date End Date Taking? Authorizing Provider  IMODIUM A-D 1 MG/7.5ML solution Take 2 mg by mouth 3 (three) times daily.    [provider]  ondansetron (ZOFRAN ODT) 4 MG disintegrating tablet Take 1 tablet (4 mg total) by mouth every 6 (six) hours as needed for nausea or vomiting. Patient not taking: Reported on 01/10/2024 07/21/21   Ward, Layla Maw, DO  potassium chloride SA (KLOR-CON M) 20 MEQ tablet Take 1 tablet (20 mEq total) by mouth 2 (two) times daily. 01/11/24 02/10/24  Carollee Herter, DO  predniSONE (STERAPRED UNI-PAK 21 TAB) 10 MG (21) TBPK tablet SMARTSIG:- Tablet(s) By Mouth - Patient not taking: Reported on 01/10/2024 01/08/24   [provider]  saccharomyces boulardii (FLORASTOR) 250 MG capsule Take 1 capsule (250 mg total) by mouth 2 (two) times daily. Any generic probiotic is okay Patient taking differently: Take 250 mg by mouth See admin instructions. Take 250 mg by mouth one to two times a day 07/21/20   Rai, Ripudeep K, MD      Allergies    Citrus,  Food, Iron, Iron sucrose, Vancomycin, Venofer [ferric oxide], Soap, Sulfamethoxazole-trimethoprim, Levaquin [levofloxacin], Morphine, and Sulfa antibiotics    Review of Systems   Review of Systems  Gastrointestinal:  Positive for abdominal pain.    Physical Exam Updated Vital Signs BP (!) 100/59 (BP Location: Left Arm)   Pulse (!) 102   Temp 97.9 F (36.6 C) (Oral)   Resp 16   Ht 5' (1.524 m)   Wt 49.9 kg   SpO2 100%   BMI 21.48 kg/m  Physical Exam Vitals and nursing [name] reviewed.  Constitutional:      General: She is not in acute distress.    Appearance: She is well-developed.  HENT:     Head: Normocephalic and atraumatic.  Eyes:     Conjunctiva/sclera: Conjunctivae normal.  Cardiovascular:     Rate and Rhythm: Normal rate and regular rhythm.     Heart sounds: No murmur heard. Pulmonary:     Effort: Pulmonary effort is normal. No respiratory distress.     Breath sounds: Normal breath sounds.  Abdominal:     Palpations: Abdomen is soft.     Tenderness: There is no abdominal tenderness.     Comments: Abdomen soft and nontender.  Ileostomy site of the right lower abdomen with nonbloody output in the bag. No erythema or edema surrounding ileostomy site  Musculoskeletal:        General: No swelling.     Cervical back: Neck supple.  Skin:    General: Skin is warm and dry.     Capillary Refill: Capillary refill takes less than 2 seconds.  Neurological:     Mental Status: She is alert.  Psychiatric:        Mood and Affect: Mood normal.     ED Results / Procedures / Treatments   Labs (all labs ordered are listed, but only abnormal results are displayed) Labs Reviewed  LIPASE, BLOOD - Abnormal; Notable for the following components:      Result Value   Lipase 68 (*)    All other components within normal limits  COMPREHENSIVE METABOLIC PANEL - Abnormal; Notable for the following components:   Sodium 132 (*)    Potassium 2.8 (*)    CO2 17 (*)    BUN 44 (*)     Creatinine, Ser 3.98 (*)    Calcium 7.3 (*)    AST 13 (*)    GFR, Estimated 14 (*)    All other components within normal limits  CBC - Abnormal; Notable for the following components:   WBC 12.5 (*)    RBC 3.85 (*)    Hemoglobin 11.0 (*)    HCT 33.5 (*)    All other components within normal limits  HCG, SERUM, QUALITATIVE  URINALYSIS, ROUTINE W REFLEX MICROSCOPIC    EKG None  Radiology No results found.  Procedures Procedures  {Document cardiac monitor, telemetry assessment procedure when appropriate:1}  Medications Ordered in ED Medications  oxyCODONE-acetaminophen (PERCOCET/ROXICET) 5-325 MG per tablet 1 tablet (1 tablet Oral Given 01/28/24 1934)    ED Course/ Medical Decision Making/ A&P   {   Click here for ABCD2, HEART and other calculatorsREFRESH [name] before signing :1}                              Medical Decision Making Amount and/or Complexity of Data Reviewed Labs: ordered.     Differential diagnosis includes but is not limited to gastroenteritis, colitis, diverticulitis, ileostomy complication  ED Course:  *** I Ordered, and personally interpreted labs.  The pertinent results include:  *** Upon re-evaluation, patient ***.    Impression: ***  Disposition:  {AF ED Dispo:29713} Return precautions given.  Imaging Studies ordered: I ordered imaging studies including CT abdomen pelvis I independently visualized the imaging with scope of interpretation limited to determining acute life threatening conditions related to emergency care. Imaging showed  Right lower ileostomy and colectomy.  No small bowel obstruction or bowel inflammation. Question of distal gastric wall thickening, concerning for gastritis or peptic ulcer disease. Resolved hydronephrosis from prior exam.  Mild persistent prominence of the right renal pelvis.  Punctate nonobstructing left renal stone. Soft tissue prominence in this region of the cervix. Pap smear recommended for  follow-up I agree with the radiologist interpretation   Cardiac Monitoring: / EKG: The patient was maintained on a cardiac monitor.  I personally viewed and interpreted the cardiac monitored which showed an underlying rhythm of: ***   Consultations Obtained: I requested consultation with the ***,  and discussed lab and imaging findings as well as pertinent plan - they recommend: ***  External records from outside source obtained and reviewed including admission [name] from 01/11/2024 where her blood pressures remained low in the 80s and 90's systolic and 50s diastolic which seem to be her baseline.        {Document critical care time when appropriate:1} {Document review of labs and clinical  decision tools ie heart score, Chads2Vasc2 etc:1}  {Document your independent review of radiology images, and any outside records:1} {Document your discussion with family members, caretakers, and with consultants:1} {Document social determinants of health affecting pt's care:1} {Document your decision making why or why not admission, treatments were needed:1} Final Clinical Impression(s) / ED Diagnoses Final diagnoses:  None    Rx / DC Orders ED Discharge Orders     None

## 2024-01-29 DIAGNOSIS — K90822 Short bowel syndrome without colon in continuity: Secondary | ICD-10-CM | POA: Diagnosis present

## 2024-01-29 DIAGNOSIS — Z85038 Personal history of other malignant neoplasm of large intestine: Secondary | ICD-10-CM | POA: Diagnosis not present

## 2024-01-29 DIAGNOSIS — Z91018 Allergy to other foods: Secondary | ICD-10-CM | POA: Diagnosis not present

## 2024-01-29 DIAGNOSIS — Z885 Allergy status to narcotic agent status: Secondary | ICD-10-CM | POA: Diagnosis not present

## 2024-01-29 DIAGNOSIS — Z79899 Other long term (current) drug therapy: Secondary | ICD-10-CM | POA: Diagnosis not present

## 2024-01-29 DIAGNOSIS — Z882 Allergy status to sulfonamides status: Secondary | ICD-10-CM | POA: Diagnosis not present

## 2024-01-29 DIAGNOSIS — E861 Hypovolemia: Secondary | ICD-10-CM | POA: Diagnosis present

## 2024-01-29 DIAGNOSIS — N184 Chronic kidney disease, stage 4 (severe): Secondary | ICD-10-CM | POA: Diagnosis present

## 2024-01-29 DIAGNOSIS — D1391 Familial adenomatous polyposis: Secondary | ICD-10-CM | POA: Diagnosis present

## 2024-01-29 DIAGNOSIS — E876 Hypokalemia: Secondary | ICD-10-CM

## 2024-01-29 DIAGNOSIS — N179 Acute kidney failure, unspecified: Secondary | ICD-10-CM | POA: Diagnosis present

## 2024-01-29 DIAGNOSIS — Z91048 Other nonmedicinal substance allergy status: Secondary | ICD-10-CM | POA: Diagnosis not present

## 2024-01-29 DIAGNOSIS — Z9049 Acquired absence of other specified parts of digestive tract: Secondary | ICD-10-CM | POA: Diagnosis not present

## 2024-01-29 DIAGNOSIS — Z888 Allergy status to other drugs, medicaments and biological substances status: Secondary | ICD-10-CM | POA: Diagnosis not present

## 2024-01-29 DIAGNOSIS — Z820 Family history of epilepsy and other diseases of the nervous system: Secondary | ICD-10-CM | POA: Diagnosis not present

## 2024-01-29 DIAGNOSIS — E871 Hypo-osmolality and hyponatremia: Secondary | ICD-10-CM | POA: Diagnosis present

## 2024-01-29 DIAGNOSIS — E872 Acidosis, unspecified: Secondary | ICD-10-CM | POA: Diagnosis present

## 2024-01-29 DIAGNOSIS — E860 Dehydration: Secondary | ICD-10-CM | POA: Diagnosis present

## 2024-01-29 DIAGNOSIS — I959 Hypotension, unspecified: Secondary | ICD-10-CM | POA: Diagnosis present

## 2024-01-29 DIAGNOSIS — Z932 Ileostomy status: Secondary | ICD-10-CM | POA: Diagnosis not present

## 2024-01-29 DIAGNOSIS — Z881 Allergy status to other antibiotic agents status: Secondary | ICD-10-CM | POA: Diagnosis not present

## 2024-01-29 LAB — BASIC METABOLIC PANEL
Anion gap: 9 (ref 5–15)
Anion gap: 9 (ref 5–15)
Anion gap: 8 (ref 5–15)
BUN: 42 mg/dL — ABNORMAL HIGH (ref 6–20)
BUN: 39 mg/dL — ABNORMAL HIGH (ref 6–20)
BUN: 35 mg/dL — ABNORMAL HIGH (ref 6–20)
CO2: 17 mmol/L — ABNORMAL LOW (ref 22–32)
CO2: 14 mmol/L — ABNORMAL LOW (ref 22–32)
CO2: 14 mmol/L — ABNORMAL LOW (ref 22–32)
Calcium: 6.2 mg/dL — CL (ref 8.9–10.3)
Calcium: 6.5 mg/dL — ABNORMAL LOW (ref 8.9–10.3)
Calcium: 6.6 mg/dL — ABNORMAL LOW (ref 8.9–10.3)
Chloride: 106 mmol/L (ref 98–111)
Chloride: 111 mmol/L (ref 98–111)
Chloride: 113 mmol/L — ABNORMAL HIGH (ref 98–111)
Creatinine, Ser: 3.34 mg/dL — ABNORMAL HIGH (ref 0.44–1.00)
Creatinine, Ser: 3.11 mg/dL — ABNORMAL HIGH (ref 0.44–1.00)
Creatinine, Ser: 2.73 mg/dL — ABNORMAL HIGH (ref 0.44–1.00)
GFR, Estimated: 18 mL/min — ABNORMAL LOW (ref >60)
GFR, Estimated: 19 mL/min — ABNORMAL LOW (ref >60)
GFR, Estimated: 23 mL/min — ABNORMAL LOW (ref >60)
Glucose, Bld: 106 mg/dL — ABNORMAL HIGH (ref 70–99)
Glucose, Bld: 116 mg/dL — ABNORMAL HIGH (ref 70–99)
Glucose, Bld: 89 mg/dL (ref 70–99)
Potassium: 2.6 mmol/L — CL (ref 3.5–5.1)
Potassium: 2.8 mmol/L — ABNORMAL LOW (ref 3.5–5.1)
Potassium: 3.1 mmol/L — ABNORMAL LOW (ref 3.5–5.1)
Sodium: 132 mmol/L — ABNORMAL LOW (ref 135–145)
Sodium: 134 mmol/L — ABNORMAL LOW (ref 135–145)
Sodium: 135 mmol/L (ref 135–145)

## 2024-01-29 LAB — I-STAT CHEM 8, ED
BUN: 32 mg/dL — ABNORMAL HIGH (ref 6–20)
Calcium, Ion: 1.02 mmol/L — ABNORMAL LOW (ref 1.15–1.40)
Chloride: 111 mmol/L (ref 98–111)
Creatinine, Ser: 3.7 mg/dL — ABNORMAL HIGH (ref 0.44–1.00)
Glucose, Bld: 85 mg/dL (ref 70–99)
HCT: 25 % — ABNORMAL LOW (ref 36.0–46.0)
Hemoglobin: 8.5 g/dL — ABNORMAL LOW (ref 12.0–15.0)
Potassium: 3.1 mmol/L — ABNORMAL LOW (ref 3.5–5.1)
Sodium: 138 mmol/L (ref 135–145)
TCO2: 15 mmol/L — ABNORMAL LOW (ref 22–32)

## 2024-01-29 LAB — MAGNESIUM
Magnesium: 1.7 mg/dL (ref 1.7–2.4)
Magnesium: 1.3 mg/dL — ABNORMAL LOW (ref 1.7–2.4)

## 2024-01-29 MED ORDER — LOPERAMIDE HCL 1 MG/7.5ML PO SUSP
2.0000 mg | Freq: Three times a day (TID) | ORAL | Status: DC
  Administered 2024-01-29: 2 mg via ORAL
  Filled 2024-01-29 (×4): qty 15

## 2024-01-29 MED ORDER — POTASSIUM CHLORIDE 10 MEQ/50ML IV SOLN
10.0000 meq | INTRAVENOUS | Status: AC
  Administered 2024-01-29 (×3): 10 meq via INTRAVENOUS
  Filled 2024-01-29 (×3): qty 50

## 2024-01-29 MED ORDER — ALBUTEROL SULFATE (2.5 MG/3ML) 0.083% IN NEBU: 2.5000 mg | INHALATION_SOLUTION | RESPIRATORY_TRACT | Status: DC | PRN

## 2024-01-29 MED ORDER — ACETAMINOPHEN 325 MG PO TABS: 650.0000 mg | ORAL_TABLET | Freq: Four times a day (QID) | ORAL | Status: DC | PRN

## 2024-01-29 MED ORDER — ACETAMINOPHEN 650 MG RE SUPP: 650.0000 mg | Freq: Four times a day (QID) | RECTAL | Status: DC | PRN

## 2024-01-29 MED ORDER — POTASSIUM CHLORIDE 10 MEQ/50ML IV SOLN
10.0000 meq | INTRAVENOUS | Status: DC
  Filled 2024-01-29 (×5): qty 50

## 2024-01-29 MED ORDER — FENTANYL CITRATE PF 50 MCG/ML IJ SOSY
50.0000 ug | PREFILLED_SYRINGE | Freq: Once | INTRAMUSCULAR | Status: AC
  Administered 2024-01-29: 50 ug via INTRAVENOUS
  Filled 2024-01-29: qty 1

## 2024-01-29 MED ORDER — SODIUM CHLORIDE 0.9 % IV BOLUS
1000.0000 mL | Freq: Once | INTRAVENOUS | Status: AC
  Administered 2024-01-29: 1000 mL via INTRAVENOUS

## 2024-01-29 MED ORDER — HEPARIN SOD (PORK) LOCK FLUSH 100 UNIT/ML IV SOLN
INTRAVENOUS | Status: AC
  Filled 2024-01-29: qty 5

## 2024-01-29 MED ORDER — POTASSIUM CHLORIDE 10 MEQ/100ML IV SOLN
10.0000 meq | INTRAVENOUS | Status: DC
  Administered 2024-01-29 (×3): 10 meq via INTRAVENOUS
  Filled 2024-01-29 (×3): qty 100

## 2024-01-29 MED ORDER — POTASSIUM CHLORIDE 10 MEQ/50ML IV SOLN
10.0000 meq | INTRAVENOUS | Status: DC
  Filled 2024-01-29 (×4): qty 50

## 2024-01-29 MED ORDER — ALBUMIN HUMAN 25 % IV SOLN
25.0000 g | Freq: Once | INTRAVENOUS | Status: AC
  Administered 2024-01-29: 25 g via INTRAVENOUS
  Filled 2024-01-29: qty 100

## 2024-01-29 MED ORDER — OXYCODONE HCL 5 MG PO TABS: 5.0000 mg | ORAL_TABLET | ORAL | Status: DC | PRN

## 2024-01-29 MED ORDER — LACTATED RINGERS IV SOLN: INTRAVENOUS | Status: AC

## 2024-01-29 MED ORDER — HEPARIN SODIUM (PORCINE) 5000 UNIT/ML IJ SOLN
5000.0000 [IU] | Freq: Three times a day (TID) | INTRAMUSCULAR | Status: DC
  Filled 2024-01-29: qty 1

## 2024-01-29 MED ORDER — MAGNESIUM SULFATE 2 GM/50ML IV SOLN: 2.0000 g | Freq: Once | INTRAVENOUS | Status: DC

## 2024-01-29 MED ORDER — CALCIUM GLUCONATE-NACL 1-0.675 GM/50ML-% IV SOLN
1.0000 g | Freq: Once | INTRAVENOUS | Status: AC
  Administered 2024-01-29: 1000 mg via INTRAVENOUS
  Filled 2024-01-29: qty 50

## 2024-01-29 NOTE — ED Notes (Signed)
 Discussed albumin with pt.  I advised her there was a note to her chart that she refused blood and blood products.  Pt states to me she has never refused any blood and has had several transfusions in the past.  Pt advised me to have it taken off her chart.  I did so.

## 2024-01-29 NOTE — Discharge Summary (Signed)
 Discharge Summary  Cindy [name] YQM:[id number] DOB: 1991-10-16  PCP: Ovidio Kin, MD  Admit date: 01/28/2024 Discharge date: 01/29/2024   Recommendations for Outpatient Follow-up:  Please follow up with your PCP with CBC and BMP in 1-2 weeks  Discharge Diagnoses:  Active Hospital Problems   Diagnosis Date Noted   Hypokalemia 04/25/2013    Priority: Medium     Resolved Hospital Problems  No resolved problems to display.   Discharge Condition: Stable   Diet recommendation: Diet Orders (From admission, onward)     Start     Ordered   01/29/24 0817  Diet regular Room service appropriate? Yes; Fluid consistency: Thin  Diet effective now       Question Answer Comment  Room service appropriate? Yes   Fluid consistency: Thin      01/29/24 0817           HPI and Brief Hospital Course:  Cindy [name] is a 33 y.o. 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  She was admitted to the hospitalist service, was given IV magnesium, IV calcium, several doses of IV and p.o. potassium.  She was also given IV fluids.  This afternoon, lab check reveals improved potassium, renal function essentially near her baseline.  She feels well, and is requesting discharge home.  Discharge details, plan of care and follow up instructions were discussed with patient and any available family or care providers. Patient and family are in agreement with discharge from the hospital today and all questions were answered to their satisfaction.  Discharge Exam: BP (!) 90/58   Pulse 76   Temp 98.9 F (37.2 C)   Resp 18   Ht 5' (1.524 m)   Wt 49.9 kg   SpO2 100%   BMI 21.48 kg/m   See admit H&P  Discharge Instructions You were cared for by a hospitalist during your hospital stay. If you have any questions about your discharge medications or the care you received while you were in the hospital after you are discharged, you can call the unit and asked to speak with the hospitalist on call if the hospitalist that took care of you is not available. Once you are discharged, your primary care physician will handle any further medical issues. Please note that NO REFILLS for any discharge medications will be authorized once you are discharged, as it is imperative that you return to your primary care physician (or establish a relationship with a primary care physician if you do not have one) for your aftercare needs so that they can reassess your need for medications and monitor your lab values.   Allergies as of 01/29/2024       Reactions   Citrus Hives   Food Anaphylaxis, Other (See Comments)   Orange juice   Iron Palpitations, Other (See Comments)   Heart stops  **pt has tolerated IV Fe**   Iron Sucrose Anaphylaxis   She had anaphylaxis to venofier but was given ferrumoxytol in allergy clinic by Dr. Silverio Decamp and tolerated well in 2020   Vancomycin Anaphylaxis   Venofer [ferric Oxide] Anaphylaxis   Soap Hives, Itching, Other (See Comments)   Dove soap   Sulfamethoxazole-trimethoprim Nausea And Vomiting, Other (See  Comments)   Patient developed AKI that appears to have been from bactrim. She might be able to take again if watched closely and it is absolutely needed - ie there was no anaphylaxis or allergic rash.   Levaquin [levofloxacin] Hives   Morphine Hives, Swelling, Other (See Comments)   When given IV, swelling around site   Sulfa Antibiotics Other (See Comments)   Dehydration        Medication List     STOP taking these medications    saccharomyces boulardii 250 MG capsule Commonly known as: FLORASTOR       TAKE these medications    Imodium A-D 1  MG/7.5ML solution Generic drug: loperamide HCl Take 2 mg by mouth 3 (three) times daily.   potassium chloride SA 20 MEQ tablet Commonly known as: KLOR-CON M Take 1 tablet (20 mEq total) by mouth 2 (two) times daily.       Allergies  Allergen Reactions   Citrus Hives   Food Anaphylaxis and Other (See Comments)    Orange juice   Iron Palpitations and Other (See Comments)    Heart stops  **pt has tolerated IV Fe**   Iron Sucrose Anaphylaxis    She had anaphylaxis to venofier but was given ferrumoxytol in allergy clinic by Dr. Silverio Decamp and tolerated well in 2020   Vancomycin Anaphylaxis   Venofer [Ferric Oxide] Anaphylaxis   Soap Hives, Itching and Other (See Comments)    Dove soap   Sulfamethoxazole-Trimethoprim Nausea And Vomiting and Other (See Comments)    Patient developed AKI that appears to have been from bactrim. She might be able to take again if watched closely and it is absolutely needed - ie there was no anaphylaxis or allergic rash.   Levaquin [Levofloxacin] Hives   Morphine Hives, Swelling and Other (See Comments)    When given IV, swelling around site   Sulfa Antibiotics Other (See Comments)    Dehydration     Follow-up Information     Ovidio Kin, MD Follow up in 1 week(s).   Specialty: Internal Medicine Contact information: [address] [place] [address] [phone]                 The results of significant diagnostics from this hospitalization (including imaging, microbiology, ancillary and laboratory) are listed below for reference.    Significant Diagnostic Studies: CT ABDOMEN PELVIS WO CONTRAST Result Date: 01/28/2024 CLINICAL DATA:  Acute abdominal pain. Patient reports increased output from ileostomy. EXAM: CT ABDOMEN AND PELVIS WITHOUT CONTRAST TECHNIQUE: Multidetector CT imaging of the abdomen and pelvis was performed following the standard protocol without IV contrast. RADIATION DOSE REDUCTION: This exam was performed  according to the departmental dose-optimization program which includes automated exposure control, adjustment of the mA and/or kV according to patient size and/or use of iterative reconstruction technique. COMPARISON:  CT 06/19/2023 at [facility] FINDINGS: Lower chest: Minimal scarring at the left lung base. No acute airspace disease or pleural effusion. Hepatobiliary: Unremarkable unenhanced appearance of the liver. Decompressed gallbladder. No calcified gallstone or pericholecystic inflammation. Pancreas: Unremarkable unenhanced appearance. Spleen: Unremarkable unenhanced appearance. Adrenals/Urinary Tract: Normal adrenal glands. Resolved hydronephrosis from prior exam. There is mild persistent prominence of the right renal pelvis. Punctate nonobstructing stone in the upper left kidney. Simple cyst in the lower left kidney. No further follow-up imaging is recommended. Decompressed ureters. No bladder wall thickening. Stomach/Bowel: Bowel assessment is limited in the absence of enteric contrast and paucity of intra-abdominal fat. Right lower quadrant ileostomy  with total colectomy. Majority of the small bowel is decompressed. No obstruction or bowel inflammation. Question of distal gastric wall thickening. The stomach is moderately distended with fluid/ingested contents. Vascular/Lymphatic: Normal caliber abdominal aorta. No bulky abdominopelvic adenopathy. Reproductive: Soft tissue prominence in the region of the cervix. Anteverted uterus. No adnexal mass. Small amount of loculated fluid in the posterior left pelvis is unchanged from prior. Other: Small amount of loculated fluid in the posterior left pelvis. No ascites. No free air. Postsurgical change of the anterior abdominal wall without hernia. Musculoskeletal: There are no acute or suspicious osseous abnormalities. IMPRESSION: 1. Right lower quadrant ileostomy, post colectomy. No small bowel obstruction or bowel inflammation. 2. Question of distal gastric  wall thickening, can be seen with gastritis or peptic ulcer disease. 3. Resolved hydronephrosis from prior exam. Mild persistent prominence of the right renal pelvis. Punctate nonobstructing left renal stone. 4. Soft tissue prominence in the region of the cervix. Recommend up-to-date Pap smear. Electronically Signed   By: Narda Rutherford M.D.   On: 01/28/2024 22:51   DG Chest 2 View Result Date: 01/10/2024 CLINICAL DATA:  Cough, weakness and fever. EXAM: CHEST - 2 VIEW COMPARISON:  08/16/2020 FINDINGS: Right chest port with tip in the SVC.The cardiomediastinal contours are normal. The lungs are clear. Pulmonary vasculature is normal. No consolidation, pleural effusion, or pneumothorax. No acute osseous abnormalities are seen. IMPRESSION: No acute findings or explanation for cough. Electronically Signed   By: Narda Rutherford M.D.   On: 01/10/2024 09:49    Microbiology: No results found for this or any previous visit (from the past 240 hours).   Labs: Basic Metabolic Panel: Recent Labs  Lab 01/28/24 1942 01/29/24 0322 01/29/24 0803 01/29/24 1047 01/29/24 1545  NA 132* 132* 138 134* 135  K 2.8* 2.6* 3.1* 2.8* 3.1*  CL 102 106 111 111 113*  CO2 17* 17*  --  14* 14*  GLUCOSE 83 106* 85 116* 89  BUN 44* 42* 32* 39* 35*  CREATININE 3.98* 3.34* 3.70* 3.11* 2.73*  CALCIUM 7.3* 6.2*  --  6.5* 6.6*  MG 1.7  --   --  1.3*  --    Liver Function Tests: Recent Labs  Lab 01/28/24 1942  AST 13*  ALT 10  ALKPHOS 81  BILITOT 0.6  PROT 7.2  ALBUMIN 3.9   Recent Labs  Lab 01/28/24 1942  LIPASE 68*   No results for input(s): "AMMONIA" in the last 168 hours. CBC: Recent Labs  Lab 01/28/24 1942 01/29/24 0803  WBC 12.5*  --   HGB 11.0* 8.5*  HCT 33.5* 25.0*  MCV 87.0  --   PLT 326  --    Cardiac Enzymes: No results for input(s): "CKTOTAL", "CKMB", "CKMBINDEX", "TROPONINI" in the last 168 hours. BNP: BNP (last 3 results) No results for input(s): "BNP" in the last 8760  hours.  ProBNP (last 3 results) No results for input(s): "PROBNP" in the last 8760 hours.  CBG: No results for input(s): "GLUCAP" in the last 168 hours.  Time spent: > 30 minutes were spent in preparing this discharge including medication reconciliation, counseling, and coordination of care.  Signed:  Lidie Glade Sharlette Dense, MD  [facility] 01/29/2024, 5:30 PM

## 2024-01-29 NOTE — ED Notes (Signed)
 Pt tolerated PO [place] sandwich without any n/v

## 2024-01-29 NOTE — ED Notes (Signed)
 Port flushed with NS and Heparin. Huber needle removed. Transparent dressing applied. JRPRN

## 2024-01-29 NOTE — H&P (Signed)
 History and Physical  Cindy [name] XBJ:[id number] DOB: Jun 02, 1991 DOA: 01/28/2024  PCP: Ovidio Kin, MD   Chief Complaint: Abdominal pain, dehydration  HPI: Cindy [name] is a 33 y.o. 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  States this is typical for her, she has no new pain or other symptoms.  She is followed by [facility] nephrology, receives fluids there weekly was last seen there yesterday for IV fluids.  Over the last 24 hours, she has had copious watery output.  Denies any fevers, blood in her output, cough, shortness of breath, chest pain or any other concerns.  Evaluation in the emergency department early this morning as detailed below shows evidence of hypocalcemia, hypokalemia, acute kidney injury.  She was given 2 L of normal saline, total of 70 mEq of potassium (both p.o. and IV), as well as 1 g of calcium gluconate.  Review of Systems: Please see HPI for pertinent positives and negatives. A complete 10 system review of systems are otherwise negative.  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 appy 12-23-2007 (emergent ruptured appendix)   FAP (familial adenomatous polyposis) 09/30/2014   Heart murmur    History of acute renal failure    2015;   2016   History of fetal demise, not currently pregnant    03-22-2013  stillborn at 24 wks   History of kidney stones    History of sepsis    admitted 12-03-2016 (post-op day 2 w/ bilateral ureteral stents and stone manipluation) discharged 12-06-2016  for urosepsis   Hypomagnesemia    Ileostomy in place [facility])    Iron deficiency anemia    Nephrolithiasis    BILATERAL    Normocytic anemia    Renal cyst, left    PER CT 11-12-2016   Urgency of urination    Past Surgical History:  Procedure Laterality Date   ABDOMINAL SURGERY     COLONOSCOPY N/A 05/24/2013   Procedure: COLONOSCOPY;  Surgeon: Barrie Folk, MD;  Location: [facility] ENDOSCOPY;  Service: Endoscopy;  Laterality: N/A;   COMPLETION PROTECTOMY AND REVISION ILEOSTOMY/ LYSIS ADHESIONS  07-28-2014    [facility]   CYSTOSCOPY W/ RETROGRADES Right 12/15/2016   Procedure: CYSTOSCOPY WITH RETROGRADE PYELOGRAM;  Surgeon: Sebastian Ache, MD;  Location: [facility];  Service: Urology;  Laterality: Right;   CYSTOSCOPY W/ URETERAL STENT PLACEMENT Bilateral 11/01/2016   Procedure: CYSTOSCOPY WITH BILATERAL RETROGRADE PYELOGRAM BILATERAL URETERAL STENT PLACEMENT;  Surgeon: Sebastian Ache, MD;  Location: [facility];  Service: Urology;  Laterality: Bilateral;   CYSTOSCOPY W/ URETERAL STENT PLACEMENT Right 12/15/2016   Procedure: CYSTOSCOPY WITH STENT REPLACEMENT;  Surgeon: Sebastian Ache, MD;  Location: [facility];  Service: Urology;  Laterality: Right;   CYSTOSCOPY W/ URETERAL STENT  REMOVAL Bilateral 12/15/2016   Procedure: CYSTOSCOPY WITH STENT REMOVAL;  Surgeon: Sebastian Ache, MD;  Location: [facility];  Service: Urology;  Laterality: Bilateral;   CYSTOSCOPY WITH URETEROSCOPY Bilateral 12/01/2016   Procedure: FIRST STAGE CYSTOSCOPY WITH URETEROSCOPY,  STONE MANIPULATION and BASKETRY, STENT EXCHANGE;  Surgeon: Sebastian Ache, MD;  Location: [facility];  Service: Urology;  Laterality: Bilateral;    CYSTOSCOPY WITH URETEROSCOPY Right 12/15/2016   Procedure: SECOND STAGE -CYSTOSCOPY WITH URETEROSCOPY AND STONE EXTRACTION WITH BASKET;  Surgeon: Sebastian Ache, MD;  Location: [facility];  Service: Urology;  Laterality: Right;   HOLMIUM LASER APPLICATION Bilateral 12/01/2016   Procedure: HOLMIUM LASER APPLICATION;  Surgeon: Sebastian Ache, MD;  Location: [facility];  Service: Urology;  Laterality: Bilateral;   HOLMIUM LASER APPLICATION Right 12/15/2016   Procedure: HOLMIUM LASER APPLICATION;  Surgeon: Sebastian Ache, MD;  Location: [facility];  Service: Urology;  Laterality: Right;   I & D EXTREMITY Left 11/14/2013   Procedure: IRRIGATION AND DEBRIDEMENT LEFT LONG FINGER;  Surgeon: Tami Ribas, MD;  Location: [facility];  Service: Orthopedics;  Laterality: Left;   IM NAILING TIBIA Left 08/23/2010   LAPAROSCOPIC APPENDECTOMY  12/23/2007   LEG RECONSTRUCTION USING FASCIAL FLAP  ~ 2009   SUBTOTAL COLECTOMY /  RESECTION ENBLOC DISTAL ILEUM/  ILEOSTOMY  07/ 2014  [facility]   TRANSTHORACIC ECHOCARDIOGRAM  11/11/2011   ef 55-60%/  mild MR/  trivial PR and TR/  PASP 29-27mmHg/  trivial pericardial effusion identified   Social History:  reports that she has never smoked. She has never used smokeless tobacco. She reports that she does not drink alcohol and does not use drugs.  Allergies  Allergen Reactions   Citrus Hives   Food Anaphylaxis and Other (See Comments)    Orange juice   Iron Palpitations and Other (See Comments)    Heart stops  **pt has tolerated IV Fe**   Iron Sucrose Anaphylaxis    She had anaphylaxis to venofier but was given ferrumoxytol in allergy clinic by Dr. Silverio Decamp and tolerated well in 2020   Vancomycin Anaphylaxis   Venofer [Ferric Oxide] Anaphylaxis   Soap Hives, Itching and Other (See Comments)    Dove soap   Sulfamethoxazole-Trimethoprim Nausea And Vomiting and Other (See Comments)    Patient developed AKI that  appears to have been from bactrim. She might be able to take again if watched closely and it is absolutely needed - ie there was no anaphylaxis or allergic rash.   Levaquin [Levofloxacin] Hives   Morphine Hives, Swelling and Other (See Comments)    When given IV, swelling around site   Sulfa Antibiotics Other (See Comments)    Dehydration     Family History  Adopted: Yes  Problem Relation Age of Onset   ALS Mother    Healthy Father    Alcohol abuse Neg Hx    Arthritis Neg Hx    Asthma Neg Hx    Birth defects Neg Hx    Cancer Neg Hx    COPD Neg Hx    Depression Neg Hx    Diabetes Neg Hx    Drug abuse Neg Hx    Early death Neg Hx    Hearing loss Neg Hx    Heart disease Neg Hx    Hyperlipidemia Neg Hx    Hypertension Neg Hx    Kidney disease Neg Hx    Learning disabilities Neg Hx  Mental illness Neg Hx    Mental retardation Neg Hx    Miscarriages / Stillbirths Neg Hx    Stroke Neg Hx    Vision loss Neg Hx      Prior to Admission medications   Medication Sig Start Date End Date Taking? Authorizing Provider  IMODIUM A-D 1 MG/7.5ML solution Take 2 mg by mouth 3 (three) times daily.   Yes [provider]  potassium chloride SA (KLOR-CON M) 20 MEQ tablet Take 1 tablet (20 mEq total) by mouth 2 (two) times daily. 01/11/24 02/10/24 Yes Carollee Herter, DO  saccharomyces boulardii (FLORASTOR) 250 MG capsule Take 1 capsule (250 mg total) by mouth 2 (two) times daily. Any generic probiotic is okay Patient not taking: Reported on 01/29/2024 07/21/20   Cathren Harsh, MD    Physical Exam: BP 91/63   Pulse 76   Temp 98.2 F (36.8 C)   Resp 18   Ht 5' (1.524 m)   Wt 49.9 kg   SpO2 100%   BMI 21.48 kg/m  General:  Alert, oriented, calm, in no acute distress, pleasant and cooperative, resting comfortably on room air Cardiovascular: RRR, no murmurs or rubs, no peripheral edema  Respiratory: clear to auscultation bilaterally, no wheezes, no crackles  Abdomen: soft, nontender,  nondistended, normal bowel tones heard  Skin: dry, no rashes  Musculoskeletal: no joint effusions, normal range of motion  Psychiatric: appropriate affect, normal speech  Neurologic: extraocular muscles intact, clear speech, moving all extremities with intact sensorium         Labs on Admission:  Basic Metabolic Panel: Recent Labs  Lab 01/28/24 1942 01/29/24 0322 01/29/24 0803  NA 132* 132* 138  K 2.8* 2.6* 3.1*  CL 102 106 111  CO2 17* 17*  --   GLUCOSE 83 106* 85  BUN 44* 42* 32*  CREATININE 3.98* 3.34* 3.70*  CALCIUM 7.3* 6.2*  --   MG 1.7  --   --    Liver Function Tests: Recent Labs  Lab 01/28/24 1942  AST 13*  ALT 10  ALKPHOS 81  BILITOT 0.6  PROT 7.2  ALBUMIN 3.9   Recent Labs  Lab 01/28/24 1942  LIPASE 68*   No results for input(s): "AMMONIA" in the last 168 hours. CBC: Recent Labs  Lab 01/28/24 1942 01/29/24 0803  WBC 12.5*  --   HGB 11.0* 8.5*  HCT 33.5* 25.0*  MCV 87.0  --   PLT 326  --    Cardiac Enzymes: No results for input(s): "CKTOTAL", "CKMB", "CKMBINDEX", "TROPONINI" in the last 168 hours. BNP (last 3 results) No results for input(s): "BNP" in the last 8760 hours.  ProBNP (last 3 results) No results for input(s): "PROBNP" in the last 8760 hours.  CBG: No results for input(s): "GLUCAP" in the last 168 hours.  Radiological Exams on Admission: CT ABDOMEN PELVIS WO CONTRAST Result Date: 01/28/2024 CLINICAL DATA:  Acute abdominal pain. Patient reports increased output from ileostomy. EXAM: CT ABDOMEN AND PELVIS WITHOUT CONTRAST TECHNIQUE: Multidetector CT imaging of the abdomen and pelvis was performed following the standard protocol without IV contrast. RADIATION DOSE REDUCTION: This exam was performed according to the departmental dose-optimization program which includes automated exposure control, adjustment of the mA and/or kV according to patient size and/or use of iterative reconstruction technique. COMPARISON:  CT 06/19/2023 at [facility] FINDINGS: Lower chest: Minimal scarring at the left lung base. No acute airspace disease or pleural effusion. Hepatobiliary: Unremarkable unenhanced appearance of the liver. Decompressed gallbladder.  No calcified gallstone or pericholecystic inflammation. Pancreas: Unremarkable unenhanced appearance. Spleen: Unremarkable unenhanced appearance. Adrenals/Urinary Tract: Normal adrenal glands. Resolved hydronephrosis from prior exam. There is mild persistent prominence of the right renal pelvis. Punctate nonobstructing stone in the upper left kidney. Simple cyst in the lower left kidney. No further follow-up imaging is recommended. Decompressed ureters. No bladder wall thickening. Stomach/Bowel: Bowel assessment is limited in the absence of enteric contrast and paucity of intra-abdominal fat. Right lower quadrant ileostomy with total colectomy. Majority of the small bowel is decompressed. No obstruction or bowel inflammation. Question of distal gastric wall thickening. The stomach is moderately distended with fluid/ingested contents. Vascular/Lymphatic: Normal caliber abdominal aorta. No bulky abdominopelvic adenopathy. Reproductive: Soft tissue prominence in the region of the cervix. Anteverted uterus. No adnexal mass. Small amount of loculated fluid in the posterior left pelvis is unchanged from prior. Other: Small amount of loculated fluid in the posterior left pelvis. No ascites. No free air. Postsurgical change of the anterior abdominal wall without hernia. Musculoskeletal: There are no acute or suspicious osseous abnormalities. IMPRESSION: 1. Right lower quadrant ileostomy, post colectomy. No small bowel obstruction or bowel inflammation. 2. Question of distal gastric wall thickening, can be seen with gastritis or peptic ulcer disease. 3. Resolved hydronephrosis from prior exam. Mild persistent prominence of the right renal pelvis. Punctate nonobstructing left renal stone. 4. Soft tissue prominence in the  region of the cervix. Recommend up-to-date Pap smear. Electronically Signed   By: Narda Rutherford M.D.   On: 01/28/2024 22:51   Assessment/Plan Cindy [name] is a 33 y.o. 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   Hypokalemia-due to GI losses from known short gut syndrome, and recent copious watery output over the last 24 hours. -Observation admission -Since potassium improving can go to MedSurg, no need for telemetry -Check magnesium level, and recheck true BMP as i-STAT numbers do not completely make sense to me (such as sodium 138 and significant jump in potassium level) -Potassium level currently 3.1, will give 30 more milliequivalents via her port now -Recheck BMP in the morning  Acute renal failure superimposed on stage IV CKD-baseline creatinine between 2.5-3.0, follows with Dr. Ricci Barker with [facility] nephrology.  Receives weekly fluids and nephrology clinic.  This likely ATN from dehydration/hypotension in the setting of fluid losses. -Received 2 L normal saline in the emergency department -Will continue hydration  Hypotension-likely due to hypovolemia, and small body habitus.  Patient confirms that her baseline normal systolic blood pressure is usually 90-100.  She is resting comfortably without headache, dizziness or other symptoms of hypotension.  Metabolic acidosis-noted to be a chronic problem for her  Chronic hyponatremia-appears to be at baseline  Short gut syndrome-continue Imodium 3 times daily  DVT prophylaxis: Heparin subcu    Code Status: Full Code  Consults called: None  Admission status: Observation  Time spent: 48 minutes  Taisei Bonnette Sharlette Dense MD [facility] Pager [phone]  If 7PM-7AM, please contact night-coverage [email or web address] Password [facility]  01/29/2024, 8:17 AM

## 2024-02-22 ENCOUNTER — Other Ambulatory Visit: Payer: Self-pay

## 2024-02-22 ENCOUNTER — Inpatient Hospital Stay (HOSPITAL_BASED_OUTPATIENT_CLINIC_OR_DEPARTMENT_OTHER)
Admission: EM | Admit: 2024-02-22 | Discharge: 2024-02-23 | DRG: 683 | Disposition: A | Discharge status: Home / Self Care | Payer: PRIVATE HEALTH INSURANCE | Attending: Internal Medicine | Admitting: Internal Medicine

## 2024-02-22 ENCOUNTER — Emergency Department (HOSPITAL_BASED_OUTPATIENT_CLINIC_OR_DEPARTMENT_OTHER): Payer: PRIVATE HEALTH INSURANCE

## 2024-02-22 ENCOUNTER — Encounter (HOSPITAL_BASED_OUTPATIENT_CLINIC_OR_DEPARTMENT_OTHER): Payer: Self-pay | Admitting: *Deleted

## 2024-02-22 DIAGNOSIS — Z882 Allergy status to sulfonamides status: Secondary | ICD-10-CM

## 2024-02-22 DIAGNOSIS — Z9049 Acquired absence of other specified parts of digestive tract: Secondary | ICD-10-CM | POA: Diagnosis not present

## 2024-02-22 DIAGNOSIS — Z79899 Other long term (current) drug therapy: Secondary | ICD-10-CM

## 2024-02-22 DIAGNOSIS — D509 Iron deficiency anemia, unspecified: Secondary | ICD-10-CM | POA: Diagnosis present

## 2024-02-22 DIAGNOSIS — R197 Diarrhea, unspecified: Principal | ICD-10-CM | POA: Diagnosis present

## 2024-02-22 DIAGNOSIS — Z881 Allergy status to other antibiotic agents status: Secondary | ICD-10-CM

## 2024-02-22 DIAGNOSIS — N179 Acute kidney failure, unspecified: Principal | ICD-10-CM | POA: Diagnosis present

## 2024-02-22 DIAGNOSIS — Z87442 Personal history of urinary calculi: Secondary | ICD-10-CM

## 2024-02-22 DIAGNOSIS — R1011 Right upper quadrant pain: Secondary | ICD-10-CM | POA: Diagnosis present

## 2024-02-22 DIAGNOSIS — I9589 Other hypotension: Secondary | ICD-10-CM | POA: Diagnosis present

## 2024-02-22 DIAGNOSIS — I959 Hypotension, unspecified: Secondary | ICD-10-CM

## 2024-02-22 DIAGNOSIS — N185 Chronic kidney disease, stage 5: Secondary | ICD-10-CM | POA: Insufficient documentation

## 2024-02-22 DIAGNOSIS — N189 Chronic kidney disease, unspecified: Secondary | ICD-10-CM | POA: Diagnosis present

## 2024-02-22 DIAGNOSIS — E876 Hypokalemia: Secondary | ICD-10-CM | POA: Diagnosis present

## 2024-02-22 DIAGNOSIS — E860 Dehydration: Secondary | ICD-10-CM | POA: Diagnosis present

## 2024-02-22 DIAGNOSIS — N184 Chronic kidney disease, stage 4 (severe): Secondary | ICD-10-CM | POA: Diagnosis present

## 2024-02-22 DIAGNOSIS — N3000 Acute cystitis without hematuria: Secondary | ICD-10-CM

## 2024-02-22 DIAGNOSIS — N390 Urinary tract infection, site not specified: Secondary | ICD-10-CM

## 2024-02-22 DIAGNOSIS — E8722 Chronic metabolic acidosis: Secondary | ICD-10-CM | POA: Diagnosis present

## 2024-02-22 DIAGNOSIS — Z885 Allergy status to narcotic agent status: Secondary | ICD-10-CM | POA: Diagnosis not present

## 2024-02-22 DIAGNOSIS — Z85038 Personal history of other malignant neoplasm of large intestine: Secondary | ICD-10-CM | POA: Diagnosis not present

## 2024-02-22 DIAGNOSIS — E861 Hypovolemia: Secondary | ICD-10-CM | POA: Diagnosis present

## 2024-02-22 DIAGNOSIS — Z932 Ileostomy status: Secondary | ICD-10-CM | POA: Diagnosis not present

## 2024-02-22 DIAGNOSIS — E871 Hypo-osmolality and hyponatremia: Secondary | ICD-10-CM | POA: Diagnosis present

## 2024-02-22 DIAGNOSIS — D1391 Familial adenomatous polyposis: Secondary | ICD-10-CM | POA: Diagnosis present

## 2024-02-22 DIAGNOSIS — R55 Syncope and collapse: Secondary | ICD-10-CM | POA: Diagnosis present

## 2024-02-22 LAB — COMPREHENSIVE METABOLIC PANEL WITH GFR
ALT: 9 U/L (ref 0–44)
AST: 15 U/L (ref 15–41)
Albumin: 5.1 g/dL — ABNORMAL HIGH (ref 3.5–5.0)
Alkaline Phosphatase: 131 U/L — ABNORMAL HIGH (ref 38–126)
Anion gap: 18 — ABNORMAL HIGH (ref 5–15)
BUN: 56 mg/dL — ABNORMAL HIGH (ref 6–20)
CO2: 14 mmol/L — ABNORMAL LOW (ref 22–32)
Calcium: 7.5 mg/dL — ABNORMAL LOW (ref 8.9–10.3)
Chloride: 96 mmol/L — ABNORMAL LOW (ref 98–111)
Creatinine, Ser: 4.9 mg/dL — ABNORMAL HIGH (ref 0.44–1.00)
GFR, Estimated: 11 mL/min — ABNORMAL LOW (ref >60)
Glucose, Bld: 79 mg/dL (ref 70–99)
Potassium: 3.4 mmol/L — ABNORMAL LOW (ref 3.5–5.1)
Sodium: 128 mmol/L — ABNORMAL LOW (ref 135–145)
Total Bilirubin: 0.4 mg/dL (ref 0.0–1.2)
Total Protein: 8.1 g/dL (ref 6.5–8.1)

## 2024-02-22 LAB — URINALYSIS, MICROSCOPIC (REFLEX): WBC, UA: >50 WBC/hpf (ref 0–5)

## 2024-02-22 LAB — URINALYSIS, ROUTINE W REFLEX MICROSCOPIC
Bilirubin Urine: NEGATIVE
Glucose, UA: NEGATIVE mg/dL
Ketones, ur: NEGATIVE mg/dL
Nitrite: NEGATIVE
Protein, ur: 30 mg/dL — AB
Specific Gravity, Urine: 1.015 (ref 1.005–1.030)
pH: 5.5 (ref 5.0–8.0)

## 2024-02-22 LAB — CBC
HCT: 37.8 % (ref 36.0–46.0)
Hemoglobin: 12.8 g/dL (ref 12.0–15.0)
MCH: 27.9 pg (ref 26.0–34.0)
MCHC: 33.9 g/dL (ref 30.0–36.0)
MCV: 82.5 fL (ref 80.0–100.0)
Platelets: 429 10*3/uL — ABNORMAL HIGH (ref 150–400)
RBC: 4.58 MIL/uL (ref 3.87–5.11)
RDW: 12.6 % (ref 11.5–15.5)
WBC: 13.4 10*3/uL — ABNORMAL HIGH (ref 4.0–10.5)
nRBC: 0 % (ref 0.0–0.2)

## 2024-02-22 LAB — BASIC METABOLIC PANEL WITH GFR
Anion gap: 15 (ref 5–15)
BUN: 55 mg/dL — ABNORMAL HIGH (ref 6–20)
CO2: 10 mmol/L — ABNORMAL LOW (ref 22–32)
Calcium: 6.6 mg/dL — ABNORMAL LOW (ref 8.9–10.3)
Chloride: 106 mmol/L (ref 98–111)
Creatinine, Ser: 4.19 mg/dL — ABNORMAL HIGH (ref 0.44–1.00)
GFR, Estimated: 14 mL/min — ABNORMAL LOW (ref >60)
Glucose, Bld: 91 mg/dL (ref 70–99)
Potassium: 3.1 mmol/L — ABNORMAL LOW (ref 3.5–5.1)
Sodium: 131 mmol/L — ABNORMAL LOW (ref 135–145)

## 2024-02-22 LAB — PREGNANCY, URINE: Preg Test, Ur: NEGATIVE

## 2024-02-22 LAB — MRSA NEXT GEN BY PCR, NASAL: MRSA by PCR Next Gen: NOT DETECTED

## 2024-02-22 LAB — LIPASE, BLOOD: Lipase: 77 U/L — ABNORMAL HIGH (ref 11–51)

## 2024-02-22 LAB — MAGNESIUM: Magnesium: 1.8 mg/dL (ref 1.7–2.4)

## 2024-02-22 LAB — LACTIC ACID, PLASMA: Lactic Acid, Venous: 0.7 mmol/L (ref 0.5–1.9)

## 2024-02-22 MED ORDER — FENTANYL CITRATE PF 50 MCG/ML IJ SOSY
50.0000 ug | PREFILLED_SYRINGE | Freq: Once | INTRAMUSCULAR | Status: AC
  Administered 2024-02-22: 50 ug via INTRAVENOUS
  Filled 2024-02-22: qty 1

## 2024-02-22 MED ORDER — LOPERAMIDE HCL 1 MG/7.5ML PO SUSP
2.0000 mg | Freq: Three times a day (TID) | ORAL | Status: DC
  Filled 2024-02-22 (×3): qty 15

## 2024-02-22 MED ORDER — SODIUM CHLORIDE 0.9 % IV BOLUS
1000.0000 mL | Freq: Once | INTRAVENOUS | Status: AC
  Administered 2024-02-22: 1000 mL via INTRAVENOUS

## 2024-02-22 MED ORDER — HEPARIN SODIUM (PORCINE) 5000 UNIT/ML IJ SOLN
5000.0000 [IU] | Freq: Three times a day (TID) | INTRAMUSCULAR | Status: DC
  Administered 2024-02-23: 5000 [IU] via SUBCUTANEOUS

## 2024-02-22 MED ORDER — SODIUM CHLORIDE 0.9 % IV SOLN: INTRAVENOUS | Status: DC

## 2024-02-22 MED ORDER — ORAL CARE MOUTH RINSE: 15.0000 mL | OROMUCOSAL | Status: DC | PRN

## 2024-02-22 MED ORDER — KETOROLAC TROMETHAMINE 15 MG/ML IJ SOLN
15.0000 mg | Freq: Once | INTRAMUSCULAR | Status: AC
  Administered 2024-02-22: 15 mg via INTRAVENOUS
  Filled 2024-02-22: qty 1

## 2024-02-22 MED ORDER — SODIUM CHLORIDE 0.9% FLUSH
10.0000 mL | Freq: Two times a day (BID) | INTRAVENOUS | Status: DC
Start: 2024-02-22 — End: 2024-02-23
  Administered 2024-02-22: 10 mL
  Administered 2024-02-23: 20 mL

## 2024-02-22 MED ORDER — ONDANSETRON HCL 4 MG/2ML IJ SOLN
4.0000 mg | Freq: Once | INTRAMUSCULAR | Status: AC
  Administered 2024-02-22: 4 mg via INTRAVENOUS
  Filled 2024-02-22: qty 2

## 2024-02-22 MED ORDER — SODIUM CHLORIDE 0.9% FLUSH: 10.0000 mL | INTRAVENOUS | Status: DC | PRN

## 2024-02-22 MED ORDER — POTASSIUM CHLORIDE CRYS ER 20 MEQ PO TBCR
20.0000 meq | EXTENDED_RELEASE_TABLET | Freq: Two times a day (BID) | ORAL | Status: DC
  Administered 2024-02-22: 20 meq via ORAL
  Filled 2024-02-22: qty 1

## 2024-02-22 MED ORDER — SODIUM CHLORIDE 0.9 % IV SOLN
1.0000 g | Freq: Once | INTRAVENOUS | Status: AC
  Administered 2024-02-22: 1 g via INTRAVENOUS
  Filled 2024-02-22: qty 10

## 2024-02-22 MED ORDER — CHLORHEXIDINE GLUCONATE CLOTH 2 % EX PADS
6.0000 | MEDICATED_PAD | Freq: Every day | CUTANEOUS | Status: DC
  Administered 2024-02-23: 6 via TOPICAL

## 2024-02-22 NOTE — ED Notes (Signed)
 To restroom without assistance.

## 2024-02-22 NOTE — ED Notes (Addendum)
 Fall risk armband Fall risk sign on door  Fall risk socks

## 2024-02-22 NOTE — ED Provider Notes (Signed)
 Patient presented here today with hypotension diarrhea feeling dehydrated treated.  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  Patient is known to have chronic kidney disease stage IV presents with concerns for increased output from ileostomy bag here today and also patient had a similar presentation with admission on March 3.  Patient's ultrasound CT scan abdomen pelvis without any acute findings.  The patient's complete metabolic panel is very concerning sodium is 128.  CO2 14 her GFR is 11 but most concern is that that creatinine is 4.9.  She was to something when discharged from the last admission on March 4.  Patient was admitted March 3 to March 4.  Most of this unsure has to do with the ileostomy.  Patient now on third liter of IV fluids.  Just at 515 we had a blood pressure of 87 systolic.  Currently she is 106.  Will contact hospitalist for admission.  Try to do that earlier but patient refused.  Also urinalysis is suggestive of urinary tract infection so we will give the antibiotic Rocephin and have sent urine for culture.  CRITICAL CARE Performed by: Vanetta Mulders Total critical care time: 45 minutes Critical care time was exclusive of separately billable procedures and treating other patients. Critical care was necessary to treat or prevent imminent or life-threatening deterioration. Critical care was time spent personally by me on the following activities: development of treatment plan with patient and/or surrogate as well as nursing, discussions with consultants, evaluation of patient's response to treatment, examination of patient, obtaining history from patient or surrogate, ordering and performing treatments and interventions, ordering and review of laboratory studies, ordering and review of radiographic studies, pulse oximetry and  re-evaluation of patient's condition.    Vanetta Mulders, MD 02/22/24 [phone]

## 2024-02-22 NOTE — ED Notes (Signed)
  Called [facility] for transport to [facility] @18 :51.

## 2024-02-22 NOTE — ED Triage Notes (Signed)
 Pt states that she feels dehydrated and has been feeling like she was going to pass out.  Pt is having abdominal pain, has illeostomy with increased output for one day.

## 2024-02-22 NOTE — ED Notes (Signed)
 Fall risk armband Fall risk socks Fall risk sign on door

## 2024-02-22 NOTE — ED Notes (Signed)
 Patient transported to CT

## 2024-02-22 NOTE — H&P (Signed)
 History and Physical    Cindy [name] KGM:[id number] DOB: 1991-10-08 DOA: 02/22/2024  Patient coming from: Home.  Chief Complaint: Weakness and increased ileostomy output.  HPI: Cindy [name] is a 33 y.o. 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  Patient states her ileostomy output has been increased.  Denies any nausea or vomiting.  ED Course: In the ER patient was hypotensive with a systolic in the 80s improved with fluid bolus.  CT abdomen pelvis shows nonspecific finding including possibility of peptic ulcer disease.  Creatinine was 4.9 sodium 128 potassium 3.4.  Patient's last creatinine on 01/29/24 was 2.7.  Patient was started on fluids admitted for acute on chronic kidney disease with hypovolemia contributing to hypotension.  Review of Systems: As per HPI, rest all negative.   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 appy 12-23-2007 (emergent ruptured appendix)   FAP (familial adenomatous polyposis) 09/30/2014   Heart murmur    History of acute renal failure    2015;  2016   History of fetal demise, not currently pregnant    03-22-2013  stillborn at 24 wks   History of kidney stones    History of sepsis    admitted 12-03-2016 (post-op day 2 w/ bilateral ureteral stents and stone manipluation)  discharged 12-06-2016  for urosepsis   Hypomagnesemia    Ileostomy in place [facility])    Iron deficiency anemia    Nephrolithiasis    BILATERAL    Normocytic anemia    Renal cyst, left    PER CT 11-12-2016   Urgency of urination     Past Surgical History:  Procedure Laterality Date   ABDOMINAL SURGERY     COLONOSCOPY N/A 05/24/2013   Procedure: COLONOSCOPY;  Surgeon: Barrie Folk, MD;  Location: [facility] ENDOSCOPY;  Service: Endoscopy;  Laterality: N/A;   COMPLETION PROTECTOMY AND REVISION ILEOSTOMY/ LYSIS ADHESIONS  07-28-2014    [facility]   CYSTOSCOPY W/ RETROGRADES Right 12/15/2016   Procedure: CYSTOSCOPY WITH RETROGRADE PYELOGRAM;  Surgeon: Sebastian Ache, MD;  Location: [facility];  Service: Urology;  Laterality: Right;   CYSTOSCOPY W/ URETERAL STENT PLACEMENT Bilateral 11/01/2016   Procedure: CYSTOSCOPY WITH BILATERAL RETROGRADE PYELOGRAM BILATERAL URETERAL STENT PLACEMENT;  Surgeon: Sebastian Ache, MD;  Location: [facility];  Service: Urology;  Laterality: Bilateral;   CYSTOSCOPY W/ URETERAL STENT PLACEMENT Right 12/15/2016   Procedure: CYSTOSCOPY WITH STENT REPLACEMENT;  Surgeon: Sebastian Ache, MD;  Location: [facility];  Service: Urology;  Laterality: Right;   CYSTOSCOPY W/ URETERAL STENT REMOVAL Bilateral 12/15/2016   Procedure: CYSTOSCOPY WITH STENT REMOVAL;  Surgeon: Sebastian Ache, MD;  Location: [facility];  Service: Urology;  Laterality: Bilateral;   CYSTOSCOPY WITH URETEROSCOPY Bilateral 12/01/2016   Procedure: FIRST STAGE CYSTOSCOPY WITH URETEROSCOPY,  STONE MANIPULATION and BASKETRY, STENT EXCHANGE;  Surgeon: Sebastian Ache, MD;  Location: [facility];  Service: Urology;  Laterality: Bilateral;   CYSTOSCOPY WITH URETEROSCOPY Right 12/15/2016   Procedure: SECOND STAGE -CYSTOSCOPY WITH URETEROSCOPY AND STONE EXTRACTION WITH BASKET;  Surgeon: Sebastian Ache, MD;  Location: [facility];  Service: Urology;   Laterality: Right;   HOLMIUM LASER APPLICATION Bilateral 12/01/2016   Procedure: HOLMIUM LASER APPLICATION;  Surgeon: Sebastian Ache, MD;  Location: [facility];  Service: Urology;  Laterality: Bilateral;   HOLMIUM LASER APPLICATION Right 12/15/2016   Procedure: HOLMIUM LASER APPLICATION;  Surgeon: Sebastian Ache, MD;  Location: [facility];  Service: Urology;  Laterality: Right;   I & D EXTREMITY Left 11/14/2013   Procedure: IRRIGATION AND DEBRIDEMENT LEFT LONG FINGER;  Surgeon: Tami Ribas, MD;  Location: [facility];  Service: Orthopedics;  Laterality: Left;   IM NAILING TIBIA Left 08/23/2010   LAPAROSCOPIC APPENDECTOMY  12/23/2007   LEG RECONSTRUCTION USING FASCIAL FLAP  ~ 2009   SUBTOTAL COLECTOMY /  RESECTION ENBLOC DISTAL ILEUM/  ILEOSTOMY  07/ 2014  [facility]   TRANSTHORACIC ECHOCARDIOGRAM  11/11/2011   ef 55-60%/  mild MR/  trivial PR and TR/  PASP 29-23mmHg/  trivial pericardial effusion identified     reports that she has never smoked. She has never used smokeless tobacco. She reports that she does not drink alcohol and does not use drugs.  Allergies  Allergen Reactions   Citrus Hives   Food Anaphylaxis and Other (See Comments)    Orange juice   Iron Palpitations and Other (See Comments)    Heart stops  **pt has tolerated IV Fe**   Iron Sucrose Anaphylaxis    She had anaphylaxis to venofier but was given ferrumoxytol in allergy clinic by Dr. Silverio Decamp and tolerated well in 2020   Vancomycin Anaphylaxis   Venofer [Ferric Oxide] Anaphylaxis   Soap Hives, Itching and Other (See Comments)    Dove soap   Sulfamethoxazole-Trimethoprim Nausea And Vomiting and Other (See Comments)    Patient developed AKI that appears to have been from bactrim. She might be able to take again if watched closely and it is absolutely needed - ie there was no anaphylaxis or allergic rash.   Levaquin [Levofloxacin] Hives   Morphine Hives, Swelling and Other  (See Comments)    When given IV, swelling around site   Sulfa Antibiotics Other (See Comments)    Dehydration     Family History  Adopted: Yes  Problem Relation Age of Onset   ALS Mother    Healthy Father    Alcohol abuse Neg Hx    Arthritis Neg Hx    Asthma Neg Hx    Birth defects Neg Hx    Cancer Neg Hx    COPD Neg Hx    Depression Neg Hx    Diabetes Neg Hx    Drug abuse Neg Hx    Early death Neg Hx    Hearing loss Neg Hx    Heart disease Neg Hx    Hyperlipidemia Neg Hx    Hypertension Neg Hx    Kidney disease Neg Hx    Learning disabilities Neg Hx    Mental illness Neg Hx    Mental retardation Neg Hx    Miscarriages / Stillbirths Neg Hx    Stroke Neg Hx    Vision loss Neg Hx     Prior to Admission medications   Medication Sig Start Date End Date Taking? Authorizing  Provider  IMODIUM A-D 1 MG/7.5ML solution Take 2 mg by mouth 3 (three) times daily.    [provider]  potassium chloride SA (KLOR-CON M) 20 MEQ tablet Take 1 tablet (20 mEq total) by mouth 2 (two) times daily. 01/11/24 02/22/24  Carollee Herter, DO    Physical Exam: Constitutional: Moderately built and nourished. Vitals:   02/22/24 2019 02/22/24 2100 02/22/24 2133 02/22/24 2206  BP:  (!) 85/43 (!) 97/52 (!) 92/55  Pulse:  93 84 88  Resp:  16 (!) 22 20  Temp: 97.9 F (36.6 C)     TempSrc: Oral     SpO2:  100% 100% 100%  Weight: 53.2 kg     Height: 5' (1.524 m)      Eyes: Anicteric no pallor. ENMT: No discharge from the ears/nose or mouth. Neck: No mass felt.  No neck rigidity. Respiratory: No rhonchi or crepitations. Cardiovascular: S1-S2 heard. Abdomen: Soft nontender bowel sound present.  Ileostomy seen. Musculoskeletal: No edema. Skin: No rash. Neurologic: Alert awake oriented to time place and person.  Moves all extremities. Psychiatric: Appears normal.  Normal affect.   Labs on Admission: I have personally reviewed following labs and imaging studies  CBC: Recent Labs  Lab  02/22/24 1040  WBC 13.4*  HGB 12.8  HCT 37.8  MCV 82.5  PLT 429*   Basic Metabolic Panel: Recent Labs  Lab 02/22/24 1040 02/22/24 1650  NA 128* 131*  K 3.4* 3.1*  CL 96* 106  CO2 14* 10*  GLUCOSE 79 91  BUN 56* 55*  CREATININE 4.90* 4.19*  CALCIUM 7.5* 6.6*  MG 1.8  --    GFR: Estimated Creatinine Clearance: 13.6 mL/min (A) (by C-G formula based on SCr of 4.19 mg/dL (H)). Liver Function Tests: Recent Labs  Lab 02/22/24 1040  AST 15  ALT 9  ALKPHOS 131*  BILITOT 0.4  PROT 8.1  ALBUMIN 5.1*   Recent Labs  Lab 02/22/24 1040  LIPASE 77*   No results for input(s): "AMMONIA" in the last 168 hours. Coagulation Profile: No results for input(s): "INR", "PROTIME" in the last 168 hours. Cardiac Enzymes: No results for input(s): "CKTOTAL", "CKMB", "CKMBINDEX", "TROPONINI" in the last 168 hours. BNP (last 3 results) No results for input(s): "PROBNP" in the last 8760 hours. HbA1C: No results for input(s): "HGBA1C" in the last 72 hours. CBG: No results for input(s): "GLUCAP" in the last 168 hours. Lipid Profile: No results for input(s): "CHOL", "HDL", "LDLCALC", "TRIG", "CHOLHDL", "LDLDIRECT" in the last 72 hours. Thyroid Function Tests: No results for input(s): "TSH", "T4TOTAL", "FREET4", "T3FREE", "THYROIDAB" in the last 72 hours. Anemia Panel: No results for input(s): "VITAMINB12", "FOLATE", "FERRITIN", "TIBC", "IRON", "RETICCTPCT" in the last 72 hours. Urine analysis:    Component Value Date/Time   COLORURINE YELLOW 02/22/2024 1008   APPEARANCEUR CLOUDY (A) 02/22/2024 1008   LABSPEC 1.015 02/22/2024 1008   PHURINE 5.5 02/22/2024 1008   GLUCOSEU NEGATIVE 02/22/2024 1008   HGBUR TRACE (A) 02/22/2024 1008   BILIRUBINUR NEGATIVE 02/22/2024 1008   KETONESUR NEGATIVE 02/22/2024 1008   PROTEINUR 30 (A) 02/22/2024 1008   UROBILINOGEN 0.2 07/29/2015 0320   NITRITE NEGATIVE 02/22/2024 1008   LEUKOCYTESUR LARGE (A) 02/22/2024 1008   Sepsis  Labs: @LABRCNTIP (procalcitonin:4,lacticidven:4) ) Recent Results (from the past 240 hours)  MRSA Next Gen by PCR, Nasal     Status: None   Collection Time: 02/22/24  9:39 PM   Specimen: Nasal Mucosa; Nasal Swab  Result Value Ref Range Status   MRSA  by PCR Next Gen NOT DETECTED NOT DETECTED Final    Comment: (NOTE) The GeneXpert MRSA Assay (FDA approved for NASAL specimens only), is one component of a comprehensive MRSA colonization surveillance program. It is not intended to diagnose MRSA infection nor to guide or monitor treatment for MRSA infections. Test performance is not FDA approved in patients less than 33 years old. Performed at [facility], [address] [place], [place] [address]      Radiological Exams on Admission: CT ABDOMEN PELVIS WO CONTRAST Result Date: 02/22/2024 CLINICAL DATA:  RLQ abdominal pain ruq and rlq pai, n,v,d, hx of colon cancer sp colectomy with iliostomy bag. GFR <30 EXAM: CT ABDOMEN AND PELVIS WITHOUT CONTRAST TECHNIQUE: Multidetector CT imaging of the abdomen and pelvis was performed following the standard protocol without IV contrast. RADIATION DOSE REDUCTION: This exam was performed according to the departmental dose-optimization program which includes automated exposure control, adjustment of the mA and/or kV according to patient size and/or use of iterative reconstruction technique. COMPARISON:  CT 01/28/2024, right upper quadrant ultrasound earlier today FINDINGS: Lower chest: Clear lung bases. Hepatobiliary: Unremarkable unenhanced appearance of the liver. The gallbladder is decompressed. No biliary dilatation. Pancreas: Unremarkable unenhanced appearance. Spleen: Unremarkable unenhanced appearance. Adrenals/Urinary Tract: Normal adrenal glands. Unchanged prominence of the right renal pelvis. Punctate left renal calculus. There may be a punctate nonobstructing stone in the lower right kidney. No perinephric fat stranding. Small left  renal cyst. No further follow-up imaging is recommended. Partially distended urinary bladder, normal for degree of distension. Stomach/Bowel: Right lower quadrant ileostomy will total colectomy. There is no obstruction, contrast reaches the ileostomy. No small bowel obstruction, wall thickening or inflammation. Equivocal wall thickening about the stomach, contrast has passed beyond the stomach limiting assessment. Vascular/Lymphatic: Normal caliber abdominal aorta. No enlarged lymph nodes in the abdomen or pelvis. Reproductive: Anteverted uterus. Soft tissue prominence in the cervix is unchanged. Small amount of loculated fluid posterior left pelvis is unchanged from prior. Other: Small amount of loculated fluid in the left pelvis is unchanged from prior exam. No abdominal ascites. No free air. No abdominal wall hernia. Musculoskeletal: There are no acute or suspicious osseous abnormalities. IMPRESSION: 1. Right lower quadrant ileostomy with total colectomy. No small bowel obstruction or inflammation. 2. Equivocal wall thickening about the stomach, contrast has passed beyond the stomach limiting assessment. Recommend correlation with symptoms of gastritis or peptic ulcer disease. 3. Unchanged prominence of the right renal pelvis. Punctate nonobstructing bilateral renal calculi. Electronically Signed   By: Narda Rutherford M.D.   On: 02/22/2024 15:56   US Abdomen Limited RUQ (LIVER/GB) Result Date: 02/22/2024 CLINICAL DATA:  Right upper quadrant pain with nausea for the past day. EXAM: ULTRASOUND ABDOMEN LIMITED RIGHT UPPER QUADRANT COMPARISON:  CT abdomen pelvis dated January 28, 2024. Renal ultrasound dated May 29, 2021. FINDINGS: Gallbladder: No gallstones or wall thickening visualized. 4 mm gallbladder polyp in the neck. No sonographic Murphy sign noted by sonographer. Common bile duct: Diameter: 3 mm, normal. Liver: No focal lesion identified. Within normal limits in parenchymal echogenicity. Portal vein is  patent on color Doppler imaging with normal direction of blood flow towards the liver. Other: Echogenic right kidney. IMPRESSION: 1. No acute abnormality. 2. 4 mm gallbladder polyp. No follow-up needed. 3. Unchanged echogenic right kidney, suggesting medical renal disease. Electronically Signed   By: Obie Dredge M.D.   On: 02/22/2024 12:02    EKG: Independently reviewed.  Sinus tachycardia.  Assessment/Plan Active Problems:   Acute  renal failure superimposed on stage 4 chronic kidney disease (HCC)   Hypokalemia   Hypotension due to hypovolemia   Chronic hyponatremia - baseline Na 130   ARF (acute renal failure) (HCC)    Acute on chronic kidney disease stage IV likely from increased ileostomy output/dehydration for which patient has been started on IV fluids.  Closely follow intake output metabolic panel. Possible UTI on ceftriaxone.  Check urine cultures. Hypotension likely from hypovolemia.  Responding to fluids.  Patient states her blood pressure usually remains low.  Check cortisol levels. Hyponatremia recheck after hydration. Hypokalemia likely from high ileostomy output.  Replace recheck.  Since patient has acute on chronic kidney disease with hypovolemia hypotension will need close monitoring further IV fluids and more than 2 midnight stay.   DVT prophylaxis: Lovenox. Code Status: Full code. Family Communication: Discussed with patient. Disposition Plan: Stepdown. Consults called: None. Admission status: Inpatient.

## 2024-02-22 NOTE — ED Provider Notes (Signed)
 [place] [facility] Provider Note   CSN: [id number] Arrival date & time: 02/22/24  4098     History  Chief Complaint  Patient presents with   Near Syncope    Cindy [name] is a 33 y.o. female.  Patient is a 33 year old female with past medical history of colon cancer with colon resection and ileostomy bag presenting for complaints of decreased blood pressure and feelings of dehydration.  Patient admits over the several days she has had nausea without vomiting, liquid stool through her ileostomy bag, and abdominal pain.  Abdominal pain is located in the right upper quadrant.  No prior history of cholecystectomy.  Patient admits to decreased p.o. intake due to symptoms.  The history is provided by the patient. No language interpreter was used.  Near Syncope Associated symptoms include abdominal pain. Pertinent negatives include no chest pain and no shortness of breath.       Home Medications Prior to Admission medications   Medication Sig Start Date End Date Taking? Authorizing Provider  famotidine (PEPCID) 40 MG/5ML suspension Take 40 mg by mouth daily as needed for heartburn or indigestion.   Yes [provider]  IMODIUM A-D 1 MG/7.5ML solution Take 30 mLs by mouth in the morning, at noon, in the evening, and at bedtime.   Yes [provider]  sodium bicarbonate 325 MG tablet Take 325 mg by mouth daily.   Yes [provider]  potassium chloride SA (KLOR-CON M) 20 MEQ tablet Take 1 tablet (20 mEq total) by mouth 3 (three) times daily. 02/23/24 03/24/24  Carollee Herter, DO      Allergies    Citrus, Food, Iron, Iron sucrose, Orange oil, Vancomycin, Venofer [ferric oxide], Soap, Sulfamethoxazole-trimethoprim, Levaquin [levofloxacin], Morphine, and Sulfa antibiotics    Review of Systems   Review of Systems  Constitutional:  Negative for chills and fever.  HENT:  Negative for ear pain and sore throat.   Eyes:  Negative  for pain and visual disturbance.  Respiratory:  Negative for cough and shortness of breath.   Cardiovascular:  Positive for near-syncope. Negative for chest pain and palpitations.  Gastrointestinal:  Positive for abdominal pain, diarrhea and nausea. Negative for vomiting.  Genitourinary:  Negative for dysuria and hematuria.  Musculoskeletal:  Negative for arthralgias and back pain.  Skin:  Negative for color change and rash.  Neurological:  Negative for seizures and syncope.  All other systems reviewed and are negative.   Physical Exam Updated Vital Signs BP (!) 131/51   Pulse 91   Temp 98 F (36.7 C) (Oral)   Resp 19   Ht 5' (1.524 m)   Wt 53.2 kg   SpO2 100%   BMI 22.91 kg/m  Physical Exam Vitals and nursing note reviewed.  Constitutional:      General: She is not in acute distress.    Appearance: She is well-developed.  HENT:     Head: Normocephalic and atraumatic.  Eyes:     Conjunctiva/sclera: Conjunctivae normal.  Cardiovascular:     Rate and Rhythm: Normal rate and regular rhythm.     Heart sounds: No murmur heard. Pulmonary:     Effort: Pulmonary effort is normal. No respiratory distress.     Breath sounds: Normal breath sounds.  Abdominal:     Palpations: Abdomen is soft.     Tenderness: There is abdominal tenderness in the right upper quadrant.  Musculoskeletal:        General: No swelling.  Cervical back: Neck supple.  Skin:    General: Skin is warm and dry.     Capillary Refill: Capillary refill takes less than 2 seconds.  Neurological:     Mental Status: She is alert.  Psychiatric:        Mood and Affect: Mood normal.     ED Results / Procedures / Treatments   Labs (all labs ordered are listed, but only abnormal results are displayed) Labs Reviewed  URINE CULTURE - Abnormal; Notable for the following components:      Result Value   Culture >=100,000 COLONIES/mL ESCHERICHIA COLI (*)    Organism ID, Bacteria ESCHERICHIA COLI (*)    All other  components within normal limits  LIPASE, BLOOD - Abnormal; Notable for the following components:   Lipase 77 (*)    All other components within normal limits  COMPREHENSIVE METABOLIC PANEL WITH GFR - Abnormal; Notable for the following components:   Sodium 128 (*)    Potassium 3.4 (*)    Chloride 96 (*)    CO2 14 (*)    BUN 56 (*)    Creatinine, Ser 4.90 (*)    Calcium 7.5 (*)    Albumin 5.1 (*)    Alkaline Phosphatase 131 (*)    GFR, Estimated 11 (*)    Anion gap 18 (*)    All other components within normal limits  CBC - Abnormal; Notable for the following components:   WBC 13.4 (*)    Platelets 429 (*)    All other components within normal limits  URINALYSIS, ROUTINE W REFLEX MICROSCOPIC - Abnormal; Notable for the following components:   APPearance CLOUDY (*)    Hgb urine dipstick TRACE (*)    Protein, ur 30 (*)    Leukocytes,Ua LARGE (*)    All other components within normal limits  URINALYSIS, MICROSCOPIC (REFLEX) - Abnormal; Notable for the following components:   Bacteria, UA MANY (*)    All other components within normal limits  BASIC METABOLIC PANEL WITH GFR - Abnormal; Notable for the following components:   Sodium 131 (*)    Potassium 3.1 (*)    CO2 10 (*)    BUN 55 (*)    Creatinine, Ser 4.19 (*)    Calcium 6.6 (*)    GFR, Estimated 14 (*)    All other components within normal limits  COMPREHENSIVE METABOLIC PANEL WITH GFR - Abnormal; Notable for the following components:   Potassium 2.7 (*)    Chloride 114 (*)    CO2 12 (*)    Glucose, Bld 100 (*)    BUN 44 (*)    Creatinine, Ser 3.16 (*)    Calcium 5.9 (*)    Total Protein 5.7 (*)    Albumin 3.0 (*)    AST 11 (*)    GFR, Estimated 19 (*)    All other components within normal limits  CBC WITH DIFFERENTIAL/PLATELET - Abnormal; Notable for the following components:   RBC 3.10 (*)    Hemoglobin 8.8 (*)    HCT 27.1 (*)    All other components within normal limits  MAGNESIUM - Abnormal; Notable for the  following components:   Magnesium 1.4 (*)    All other components within normal limits  BASIC METABOLIC PANEL WITH GFR - Abnormal; Notable for the following components:   Potassium 3.2 (*)    Chloride 118 (*)    CO2 11 (*)    Glucose, Bld 104 (*)    BUN 36 (*)  Creatinine, Ser 2.69 (*)    Calcium 6.1 (*)    GFR, Estimated 23 (*)    All other components within normal limits  CULTURE, BLOOD (ROUTINE X 2)  CULTURE, BLOOD (ROUTINE X 2)  MRSA NEXT GEN BY PCR, NASAL  PREGNANCY, URINE  MAGNESIUM  LACTIC ACID, PLASMA  MAGNESIUM    EKG EKG Interpretation Date/Time:  Friday February 22 2024 10:03:46 EDT Ventricular Rate:  107 PR Interval:  134 QRS Duration:  74 QT Interval:  350 QTC Calculation: 467 R Axis:   84  Text Interpretation: Sinus tachycardia Right atrial enlargement Borderline ECG When compared with ECG of 21-Jul-2021 01:26, PREVIOUS ECG IS PRESENT Confirmed by Vanetta Mulders [id number]) on 02/22/2024 6:01:10 PM  Radiology No results found.  Procedures Procedures    Medications Ordered in ED Medications  sodium chloride 0.9 % bolus 1,000 mL (0 mLs Intravenous Stopped 02/22/24 1159)  ondansetron (ZOFRAN) injection 4 mg (4 mg Intravenous Given 02/22/24 1046)  ketorolac (TORADOL) 15 MG/ML injection 15 mg (15 mg Intravenous Given 02/22/24 1046)  fentaNYL (SUBLIMAZE) injection 50 mcg (50 mcg Intravenous Given 02/22/24 1324)  sodium chloride 0.9 % bolus 1,000 mL (0 mLs Intravenous Stopped 02/22/24 1309)  sodium chloride 0.9 % bolus 1,000 mL (0 mLs Intravenous Stopped 02/22/24 1815)  cefTRIAXone (ROCEPHIN) 1 g in sodium chloride 0.9 % 100 mL IVPB (0 g Intravenous Stopped 02/22/24 1831)  fentaNYL (SUBLIMAZE) injection 50 mcg (50 mcg Intravenous Given 02/22/24 1932)  sodium chloride 0.9 % bolus 500 mL ( Intravenous Stopped 02/23/24 0818)  magnesium sulfate IVPB 2 g 50 mL (0 g Intravenous Stopped 02/23/24 0918)  potassium chloride 10 mEq in 100 mL IVPB (0 mEq Intravenous Stopped 02/23/24  1253)  potassium chloride SA (KLOR-CON M) CR tablet 40 mEq (40 mEq Oral Given 02/23/24 1158)  fosfomycin (MONUROL) packet 3 g (3 g Oral Given 02/23/24 1200)  potassium chloride SA (KLOR-CON M) CR tablet 40 mEq (40 mEq Oral Given 02/23/24 1305)  heparin lock flush 100 unit/mL (500 Units Intravenous Given 02/23/24 1303)    ED Course/ Medical Decision Making/ A&P                                 Medical Decision Making Amount and/or Complexity of Data Reviewed Labs: ordered. Radiology: ordered.  Risk Prescription drug management. Decision regarding hospitalization.    33 year old female with past medical history of colon cancer with colon resection and ileostomy bag presenting for complaints of decreased blood pressure and feelings of dehydration secondary to nausea, abdominal pain, diarrhea.  Patient is alert and oriented x 3, no acute distress, hypotensive with a blood pressure of 79/60 with a pulse of 105.  Abdomen is soft but she has tenderness in the right upper quadrant.  V fluids, Zofran, Toradol given.   Patient signed out to oncoming provider awaiting laboratory studies and CT studies.       Final Clinical Impression(s) / ED Diagnoses Final diagnoses:  Diarrhea, unspecified type  Hypotension, unspecified hypotension type  AKI (acute kidney injury) (HCC)  Hyponatremia  Acute cystitis without hematuria    Rx / DC Orders ED Discharge Orders          Ordered    potassium chloride SA (KLOR-CON M) 20 MEQ tablet  3 times daily        02/23/24 1244    Increase activity slowly        02/23/24 1244  Diet - low sodium heart healthy        02/23/24 1244    Discharge instructions       Comments: 1. Follow up with your primary care provider in 1-2 weeks following discharge from hospital. 2. Follow up with nephrology(Dr. Ricci Barker) with [facility] as scheduled every Friday for IV fluids.   02/23/24 1244    Call MD for:  extreme fatigue        02/23/24 1244    Call  MD for:  persistant dizziness or light-headedness        02/23/24 1244    Call MD for:  persistant nausea and vomiting        02/23/24 1244              Franne Forts, DO 03/06/24 0000

## 2024-02-23 ENCOUNTER — Encounter (HOSPITAL_COMMUNITY): Payer: Self-pay | Admitting: Internal Medicine

## 2024-02-23 DIAGNOSIS — E876 Hypokalemia: Secondary | ICD-10-CM | POA: Diagnosis not present

## 2024-02-23 DIAGNOSIS — E8722 Chronic metabolic acidosis: Secondary | ICD-10-CM

## 2024-02-23 DIAGNOSIS — E861 Hypovolemia: Secondary | ICD-10-CM | POA: Diagnosis not present

## 2024-02-23 DIAGNOSIS — N390 Urinary tract infection, site not specified: Secondary | ICD-10-CM

## 2024-02-23 DIAGNOSIS — N179 Acute kidney failure, unspecified: Secondary | ICD-10-CM | POA: Diagnosis not present

## 2024-02-23 DIAGNOSIS — E871 Hypo-osmolality and hyponatremia: Secondary | ICD-10-CM

## 2024-02-23 LAB — CBC WITH DIFFERENTIAL/PLATELET
Abs Immature Granulocytes: 0.03 10*3/uL (ref 0.00–0.07)
Basophils Absolute: 0 10*3/uL (ref 0.0–0.1)
Basophils Relative: 0 %
Eosinophils Absolute: 0.1 10*3/uL (ref 0.0–0.5)
Eosinophils Relative: 1 %
HCT: 27.1 % — ABNORMAL LOW (ref 36.0–46.0)
Hemoglobin: 8.8 g/dL — ABNORMAL LOW (ref 12.0–15.0)
Immature Granulocytes: 0 %
Lymphocytes Relative: 10 %
Lymphs Abs: 0.9 10*3/uL (ref 0.7–4.0)
MCH: 28.4 pg (ref 26.0–34.0)
MCHC: 32.5 g/dL (ref 30.0–36.0)
MCV: 87.4 fL (ref 80.0–100.0)
Monocytes Absolute: 0.7 10*3/uL (ref 0.1–1.0)
Monocytes Relative: 8 %
Neutro Abs: 6.8 10*3/uL (ref 1.7–7.7)
Neutrophils Relative %: 81 %
Platelets: 228 10*3/uL (ref 150–400)
RBC: 3.1 MIL/uL — ABNORMAL LOW (ref 3.87–5.11)
RDW: 12.7 % (ref 11.5–15.5)
WBC: 8.5 10*3/uL (ref 4.0–10.5)
nRBC: 0 % (ref 0.0–0.2)

## 2024-02-23 LAB — BASIC METABOLIC PANEL WITH GFR
Anion gap: 8 (ref 5–15)
BUN: 36 mg/dL — ABNORMAL HIGH (ref 6–20)
CO2: 11 mmol/L — ABNORMAL LOW (ref 22–32)
Calcium: 6.1 mg/dL — CL (ref 8.9–10.3)
Chloride: 118 mmol/L — ABNORMAL HIGH (ref 98–111)
Creatinine, Ser: 2.69 mg/dL — ABNORMAL HIGH (ref 0.44–1.00)
GFR, Estimated: 23 mL/min — ABNORMAL LOW (ref >60)
Glucose, Bld: 104 mg/dL — ABNORMAL HIGH (ref 70–99)
Potassium: 3.2 mmol/L — ABNORMAL LOW (ref 3.5–5.1)
Sodium: 137 mmol/L (ref 135–145)

## 2024-02-23 LAB — COMPREHENSIVE METABOLIC PANEL WITH GFR
ALT: 9 U/L (ref 0–44)
AST: 11 U/L — ABNORMAL LOW (ref 15–41)
Albumin: 3 g/dL — ABNORMAL LOW (ref 3.5–5.0)
Alkaline Phosphatase: 78 U/L (ref 38–126)
Anion gap: 9 (ref 5–15)
BUN: 44 mg/dL — ABNORMAL HIGH (ref 6–20)
CO2: 12 mmol/L — ABNORMAL LOW (ref 22–32)
Calcium: 5.9 mg/dL — CL (ref 8.9–10.3)
Chloride: 114 mmol/L — ABNORMAL HIGH (ref 98–111)
Creatinine, Ser: 3.16 mg/dL — ABNORMAL HIGH (ref 0.44–1.00)
GFR, Estimated: 19 mL/min — ABNORMAL LOW (ref >60)
Glucose, Bld: 100 mg/dL — ABNORMAL HIGH (ref 70–99)
Potassium: 2.7 mmol/L — CL (ref 3.5–5.1)
Sodium: 135 mmol/L (ref 135–145)
Total Bilirubin: 0.2 mg/dL (ref 0.0–1.2)
Total Protein: 5.7 g/dL — ABNORMAL LOW (ref 6.5–8.1)

## 2024-02-23 LAB — MAGNESIUM
Magnesium: 1.4 mg/dL — ABNORMAL LOW (ref 1.7–2.4)
Magnesium: 2.3 mg/dL (ref 1.7–2.4)

## 2024-02-23 MED ORDER — HEPARIN SOD (PORK) LOCK FLUSH 100 UNIT/ML IV SOLN
500.0000 [IU] | Freq: Once | INTRAVENOUS | Status: AC
  Administered 2024-02-23: 500 [IU] via INTRAVENOUS
  Filled 2024-02-23: qty 5

## 2024-02-23 MED ORDER — POTASSIUM CHLORIDE CRYS ER 20 MEQ PO TBCR
40.0000 meq | EXTENDED_RELEASE_TABLET | Freq: Once | ORAL | Status: AC
  Administered 2024-02-23: 40 meq via ORAL
  Filled 2024-02-23: qty 2

## 2024-02-23 MED ORDER — POTASSIUM CHLORIDE 10 MEQ/100ML IV SOLN
10.0000 meq | INTRAVENOUS | Status: AC
  Administered 2024-02-23 (×4): 10 meq via INTRAVENOUS
  Filled 2024-02-23 (×4): qty 100

## 2024-02-23 MED ORDER — FOSFOMYCIN TROMETHAMINE 3 G PO PACK
3.0000 g | PACK | Freq: Once | ORAL | Status: AC
  Administered 2024-02-23: 3 g via ORAL
  Filled 2024-02-23: qty 3

## 2024-02-23 MED ORDER — POTASSIUM CHLORIDE CRYS ER 20 MEQ PO TBCR
40.0000 meq | EXTENDED_RELEASE_TABLET | ORAL | Status: AC
  Administered 2024-02-23 (×2): 40 meq via ORAL
  Filled 2024-02-23 (×2): qty 2

## 2024-02-23 MED ORDER — SODIUM CHLORIDE 0.9 % IV BOLUS
500.0000 mL | Freq: Once | INTRAVENOUS | Status: AC
  Administered 2024-02-23: 500 mL via INTRAVENOUS

## 2024-02-23 MED ORDER — SODIUM CHLORIDE 0.9 % IV SOLN
1.0000 g | INTRAVENOUS | Status: DC
  Filled 2024-02-23: qty 10

## 2024-02-23 MED ORDER — MAGNESIUM SULFATE 2 GM/50ML IV SOLN
2.0000 g | Freq: Once | INTRAVENOUS | Status: AC
  Administered 2024-02-23: 2 g via INTRAVENOUS
  Filled 2024-02-23: qty 50

## 2024-02-23 MED ORDER — POTASSIUM CHLORIDE CRYS ER 20 MEQ PO TBCR: 20.0000 meq | EXTENDED_RELEASE_TABLET | Freq: Three times a day (TID) | ORAL | Status: DC

## 2024-02-23 NOTE — Assessment & Plan Note (Signed)
 02-23-2024 chronic.

## 2024-02-23 NOTE — Assessment & Plan Note (Signed)
 02-23-2024 Outpatient nephrology notes that pt is persistently acidotic(metabolic) due to ongoing GI losses and her persistent metabolic acidosis is not improving with po bicarb.

## 2024-02-23 NOTE — Assessment & Plan Note (Signed)
 02-23-2024 replete with IV mg

## 2024-02-23 NOTE — Assessment & Plan Note (Addendum)
 02-23-2024 due to GI losses. Aggressive replacement. Repeat BMP at 12 pm.  *update Repeat BMP shows K of 3.2. pt wants to go home today for family function. Will give her 40 meq po kcl prior to discharge.

## 2024-02-23 NOTE — Assessment & Plan Note (Signed)
 02-23-2024 resolved with IVF.

## 2024-02-23 NOTE — Subjective & Objective (Signed)
 Pt seen and examined. Pt well known to me from prior admission. Pt states she is feeling much better. Had worked on Wednesday night. 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 She ate BBQ which is not a usually meal she has. Thinks she may have gotten dehydrated at work. Noted increased ostomy output on Thursday AM. Pt still goes to nephrology clinic at [facility] every Friday for IVF. Did not go to renal clinic on Friday due to coming to ER.  Pt feeling much better. Ate regular diet. Increased ostomy output has resolved. Normal ostomy output for her.  This AM labs show persistent hypokalemia and hypomagnesemia.  Outpatient nephrology notes that pt is persistently acidotic(metabolic) due to ongoing GI losses and her persistent metabolic acidosis is not improving with po bicarb.  Pt states she had a family gathering today at 3 pm and wants to be discharged today.

## 2024-02-23 NOTE — Hospital Course (Signed)
 HPI: Cindy [name] is a 33 y.o. 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  Patient states her ileostomy output has been increased.  Denies any nausea or vomiting.   ED Course: In the ER patient was hypotensive with a systolic in the 80s improved with fluid bolus.  CT abdomen pelvis shows nonspecific finding including possibility of peptic ulcer disease.  Creatinine was 4.9 sodium 128 potassium 3.4.  Patient's last creatinine on 01/29/24 was 2.7.  Patient was started on fluids admitted for acute on chronic kidney disease with hypovolemia contributing to hypotension.  Significant Events: Admitted 02/22/2024 for AKI on CKD stage 4   Significant Labs: WBC 13.4, HgB 12.8, Plt 429 Na 128, K 3.4, CO2 of 14, BUN 56, sCr 4.9. glu 79  Significant Imaging Studies:   Antibiotic Therapy: Anti-infectives (From admission, onward)    Start     Dose/Rate Route Frequency Ordered Stop   02/23/24 1600  cefTRIAXone (ROCEPHIN) 1 g in sodium chloride 0.9 % 100 mL IVPB        1 g 200 mL/hr over 30 Minutes Intravenous Every 24 hours 02/23/24 0442 02/28/24 1559   02/22/24 1715  cefTRIAXone (ROCEPHIN) 1 g in sodium chloride 0.9 % 100 mL IVPB        1 g 200 mL/hr over 30 Minutes Intravenous  Once 02/22/24 1709 02/22/24 1831       Procedures:   Consultants:

## 2024-02-23 NOTE — Plan of Care (Signed)
 Patient verbalized understanding of discharge instructions. Problem: Education: Goal: Knowledge of General Education information will improve Description: Including pain rating scale, medication(s)/side effects and non-pharmacologic comfort measures Outcome: Completed/Met   Problem: Health Behavior/Discharge Planning: Goal: Ability to manage health-related needs will improve Outcome: Completed/Met   Problem: Clinical Measurements: Goal: Ability to maintain clinical measurements within normal limits will improve Outcome: Completed/Met Goal: Will remain free from infection Outcome: Completed/Met Goal: Diagnostic test results will improve Outcome: Completed/Met Goal: Respiratory complications will improve Outcome: Completed/Met Goal: Cardiovascular complication will be avoided Outcome: Completed/Met   Problem: Activity: Goal: Risk for activity intolerance will decrease Outcome: Completed/Met   Problem: Nutrition: Goal: Adequate nutrition will be maintained Outcome: Completed/Met   Problem: Coping: Goal: Level of anxiety will decrease Outcome: Completed/Met   Problem: Elimination: Goal: Will not experience complications related to bowel motility Outcome: Completed/Met Goal: Will not experience complications related to urinary retention Outcome: Completed/Met   Problem: Pain Managment: Goal: General experience of comfort will improve and/or be controlled Outcome: Completed/Met   Problem: Safety: Goal: Ability to remain free from injury will improve Outcome: Completed/Met   Problem: Skin Integrity: Goal: Risk for impaired skin integrity will decrease Outcome: Completed/Met

## 2024-02-23 NOTE — Assessment & Plan Note (Signed)
 02-23-2024 follows with [facility] nephrology Dr. Ricci Barker.

## 2024-02-23 NOTE — Progress Notes (Signed)
   Repeat BMP shows K of 3.2. pt wants to go home today for family function. Will give her 40 meq po kcl prior to discharge.  Basic Metabolic Panel: Recent Labs  Lab 02/22/24 1040 02/22/24 1650 02/23/24 0545 02/23/24 1113  NA 128* 131* 135 137  K 3.4* 3.1* 2.7* 3.2*  CL 96* 106 114* 118*  CO2 14* 10* 12* 11*  GLUCOSE 79 91 100* 104*  BUN 56* 55* 44* 36*  CREATININE 4.90* 4.19* 3.16* 2.69*  CALCIUM 7.5* 6.6* 5.9* 6.1*  MG 1.8  --  1.4* 2.3     Carollee Herter, DO [facility]

## 2024-02-23 NOTE — Progress Notes (Signed)
 PROGRESS NOTE    Cindy [name]  ZOX:[id number] DOB: 21-Jun-1991 DOA: 02/22/2024 PCP: Ovidio Kin, MD  Subjective: Pt seen and examined. Pt well known to me from prior admission. Pt states she is feeling much better. Had worked on Wednesday night. 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 She ate BBQ which is not a usually meal she has. Thinks she may have gotten dehydrated at work. Noted increased ostomy output on Thursday AM. Pt still goes to nephrology clinic at [facility] every Friday for IVF. Did not go to renal clinic on Friday due to coming to ER.  Pt feeling much better. Ate regular diet. Increased ostomy output has resolved. Normal ostomy output for her.  This AM labs show persistent hypokalemia and hypomagnesemia.  Outpatient nephrology notes that pt is persistently acidotic(metabolic) due to ongoing GI losses and her persistent metabolic acidosis is not improving with po bicarb.  Pt states she had a family gathering today at 3 pm and wants to be discharged today.   Hospital Course: HPI: Cindy [name] is a 33 y.o. 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  Patient states her ileostomy output has been increased.  Denies any nausea or vomiting.   ED Course: In the ER patient was hypotensive with a systolic in the 80s improved with fluid bolus.  CT abdomen pelvis shows nonspecific finding including possibility of peptic ulcer disease.  Creatinine was 4.9 sodium 128 potassium 3.4.  Patient's last creatinine on 01/29/24 was 2.7.  Patient was started on fluids admitted for acute on chronic kidney disease with hypovolemia contributing to hypotension.  Significant Events: Admitted 02/22/2024 for AKI on CKD stage 4   Significant  Labs: WBC 13.4, HgB 12.8, Plt 429 Na 128, K 3.4, CO2 of 14, BUN 56, sCr 4.9. glu 79  Significant Imaging Studies:   Antibiotic Therapy: Anti-infectives (From admission, onward)    Start     Dose/Rate Route Frequency Ordered Stop   02/23/24 1600  cefTRIAXone (ROCEPHIN) 1 g in sodium chloride 0.9 % 100 mL IVPB        1 g 200 mL/hr over 30 Minutes Intravenous Every 24 hours 02/23/24 0442 02/28/24 1559   02/22/24 1715  cefTRIAXone (ROCEPHIN) 1 g in sodium chloride 0.9 % 100 mL IVPB        1 g 200 mL/hr over 30 Minutes Intravenous  Once 02/22/24 1709 02/22/24 1831       Procedures:   Consultants:     Assessment and Plan: * Acute renal failure superimposed on stage 4 chronic kidney disease (HCC) - baseline Scr 2.4-2.8 02-23-2024 due to high output ileostomy. AKI has improved. Pt wants to go home today. Tolerating solid food without difficulty. Pt states her ostomy output has already decreased.  UTI (urinary tract infection) 02-23-2024 pt without any urinary symptoms. Give 1 dose of po fosfomycin. Stop IV rocephin.  Hypomagnesemia 02-23-2024 replete with IV mg  Hypotension due to hypovolemia 02-23-2024 resolved with IVF.  Hypokalemia 02-23-2024 due to GI losses. Aggressive replacement. Repeat BMP at 12 pm.  Chronic metabolic acidosis 02-23-2024 Outpatient nephrology notes that pt is persistently acidotic(metabolic) due to ongoing GI losses and her persistent metabolic acidosis is not improving with po bicarb.   Chronic hyponatremia - baseline Na 130 02-23-2024 chronic.  CKD (chronic kidney  disease) stage 4, GFR 15-29 ml/min (HCC) - baseline Scr 2.4-2.8 02-23-2024 follows with [facility] nephrology Dr. Ricci Barker.  DVT prophylaxis: heparin injection 5,000 Units Start: 02/23/24 0600    Code Status: Full Code Family Communication: no family at bedside. She is decisional. Disposition Plan: return home Reason for continuing need for hospitalization: pt wanting to go  home. Recheck BMP, Mg at 12 pm.  Objective: Vitals:   02/23/24 0344 02/23/24 0750 02/23/24 0800 02/23/24 0900  BP:   (!) 112/57 (!) 131/51  Pulse:   83 91  Resp:   17 19  Temp: 97.9 F (36.6 C) 97.9 F (36.6 C)    TempSrc: Oral Oral    SpO2:   100% 100%  Weight:      Height:        Intake/Output Summary (Last 24 hours) at 02/23/2024 1049 Last data filed at 02/23/2024 1042 Gross per 24 hour  Intake 4044.75 ml  Output 250 ml  Net 3794.75 ml   Filed Weights   02/22/24 2019  Weight: 53.2 kg    Examination:  Physical Exam Vitals and nursing note reviewed.  Constitutional:      General: She is not in acute distress.    Appearance: She is normal weight. She is not toxic-appearing or diaphoretic.  HENT:     Head: Normocephalic and atraumatic.     Nose: Nose normal.  Cardiovascular:     Rate and Rhythm: Normal rate and regular rhythm.  Pulmonary:     Effort: Pulmonary effort is normal.     Breath sounds: Normal breath sounds.  Abdominal:     General: Bowel sounds are normal.     Palpations: Abdomen is soft.     Comments: Ileostomy. Small brown stool in bag  Musculoskeletal:     Right lower leg: No edema.     Left lower leg: No edema.  Skin:    Capillary Refill: Capillary refill takes less than 2 seconds.     Comments: Right ant chest port-a-cath  Neurological:     General: No focal deficit present.     Mental Status: She is alert and oriented to person, place, and time.     Data Reviewed: I have personally reviewed following labs and imaging studies  CBC: Recent Labs  Lab 02/22/24 1040 02/23/24 0545  WBC 13.4* 8.5  NEUTROABS  --  6.8  HGB 12.8 8.8*  HCT 37.8 27.1*  MCV 82.5 87.4  PLT 429* 228   Basic Metabolic Panel: Recent Labs  Lab 02/22/24 1040 02/22/24 1650 02/23/24 0545  NA 128* 131* 135  K 3.4* 3.1* 2.7*  CL 96* 106 114*  CO2 14* 10* 12*  GLUCOSE 79 91 100*  BUN 56* 55* 44*  CREATININE 4.90* 4.19* 3.16*  CALCIUM 7.5* 6.6* 5.9*  MG 1.8   --  1.4*   GFR: Estimated Creatinine Clearance: 18 mL/min (A) (by C-G formula based on SCr of 3.16 mg/dL (H)). Liver Function Tests: Recent Labs  Lab 02/22/24 1040 02/23/24 0545  AST 15 11*  ALT 9 9  ALKPHOS 131* 78  BILITOT 0.4 0.2  PROT 8.1 5.7*  ALBUMIN 5.1* 3.0*   Recent Labs  Lab 02/22/24 1040  LIPASE 77*   Sepsis Labs: Recent Labs  Lab 02/22/24 1650  LATICACIDVEN 0.7    Recent Results (from the past 240 hours)  Culture, blood (Routine X 2) w Reflex to ID Panel     Status: None (Preliminary result)   Collection Time: 02/22/24  4:55 PM   Specimen: BLOOD LEFT HAND  Result Value Ref Range Status   Specimen Description   Final    BLOOD LEFT HAND Performed at [facility], [address] [place], [place] [address]    Special Requests   Final    BOTTLES DRAWN AEROBIC AND ANAEROBIC Blood Culture results may not be optimal due to an inadequate volume of blood received in culture bottles Performed at [facility], [address] [place], [place] [address]    Culture   Final    NO GROWTH < 12 HOURS Performed at [facility], [address] [place], [place] [address]    Report Status PENDING  Incomplete  Culture, blood (Routine X 2) w Reflex to ID Panel     Status: None (Preliminary result)   Collection Time: 02/22/24  4:58 PM   Specimen: BLOOD  Result Value Ref Range Status   Specimen Description   Final    BLOOD RIGHT ANTECUBITAL Performed at [facility], [address] [place], [place] [address]    Special Requests   Final    BOTTLES DRAWN AEROBIC AND ANAEROBIC Blood Culture adequate volume Performed at [facility], [address] [place], [place] [address]    Culture   Final    NO GROWTH < 12 HOURS Performed at [facility], [address] [place], [place] [address]    Report Status PENDING  Incomplete  MRSA Next Gen by PCR, Nasal     Status: None   Collection Time: 02/22/24  9:39 PM    Specimen: Nasal Mucosa; Nasal Swab  Result Value Ref Range Status   MRSA by PCR Next Gen NOT DETECTED NOT DETECTED Final    Comment: (NOTE) The GeneXpert MRSA Assay (FDA approved for NASAL specimens only), is one component of a comprehensive MRSA colonization surveillance program. It is not intended to diagnose MRSA infection nor to guide or monitor treatment for MRSA infections. Test performance is not FDA approved in patients less than 20 years old. Performed at [facility], [address], [place], [place] [address]      Radiology Studies: CT ABDOMEN PELVIS WO CONTRAST Result Date: 02/22/2024 CLINICAL DATA:  RLQ abdominal pain ruq and rlq pai, n,v,d, hx of colon cancer sp colectomy with iliostomy bag. GFR <30 EXAM: CT ABDOMEN AND PELVIS WITHOUT CONTRAST TECHNIQUE: Multidetector CT imaging of the abdomen and pelvis was performed following the standard protocol without IV contrast. RADIATION DOSE REDUCTION: This exam was performed according to the departmental dose-optimization program which includes automated exposure control, adjustment of the mA and/or kV according to patient size and/or use of iterative reconstruction technique. COMPARISON:  CT 01/28/2024, right upper quadrant ultrasound earlier today FINDINGS: Lower chest: Clear lung bases. Hepatobiliary: Unremarkable unenhanced appearance of the liver. The gallbladder is decompressed. No biliary dilatation. Pancreas: Unremarkable unenhanced appearance. Spleen: Unremarkable unenhanced appearance. Adrenals/Urinary Tract: Normal adrenal glands. Unchanged prominence of the right renal pelvis. Punctate left renal calculus. There may be a punctate nonobstructing stone in the lower right kidney. No perinephric fat stranding. Small left renal cyst. No further follow-up imaging is recommended. Partially distended urinary bladder, normal for degree of distension. Stomach/Bowel: Right lower quadrant ileostomy will total colectomy.  There is no obstruction, contrast reaches the ileostomy. No small bowel obstruction, wall thickening or inflammation. Equivocal wall thickening about the stomach, contrast has passed beyond the stomach limiting assessment. Vascular/Lymphatic: Normal  caliber abdominal aorta. No enlarged lymph nodes in the abdomen or pelvis. Reproductive: Anteverted uterus. Soft tissue prominence in the cervix is unchanged. Small amount of loculated fluid posterior left pelvis is unchanged from prior. Other: Small amount of loculated fluid in the left pelvis is unchanged from prior exam. No abdominal ascites. No free air. No abdominal wall hernia. Musculoskeletal: There are no acute or suspicious osseous abnormalities. IMPRESSION: 1. Right lower quadrant ileostomy with total colectomy. No small bowel obstruction or inflammation. 2. Equivocal wall thickening about the stomach, contrast has passed beyond the stomach limiting assessment. Recommend correlation with symptoms of gastritis or peptic ulcer disease. 3. Unchanged prominence of the right renal pelvis. Punctate nonobstructing bilateral renal calculi. Electronically Signed   By: Narda Rutherford M.D.   On: 02/22/2024 15:56   US Abdomen Limited RUQ (LIVER/GB) Result Date: 02/22/2024 CLINICAL DATA:  Right upper quadrant pain with nausea for the past day. EXAM: ULTRASOUND ABDOMEN LIMITED RIGHT UPPER QUADRANT COMPARISON:  CT abdomen pelvis dated January 28, 2024. Renal ultrasound dated May 29, 2021. FINDINGS: Gallbladder: No gallstones or wall thickening visualized. 4 mm gallbladder polyp in the neck. No sonographic Murphy sign noted by sonographer. Common bile duct: Diameter: 3 mm, normal. Liver: No focal lesion identified. Within normal limits in parenchymal echogenicity. Portal vein is patent on color Doppler imaging with normal direction of blood flow towards the liver. Other: Echogenic right kidney. IMPRESSION: 1. No acute abnormality. 2. 4 mm gallbladder polyp. No follow-up  needed. 3. Unchanged echogenic right kidney, suggesting medical renal disease. Electronically Signed   By: Obie Dredge M.D.   On: 02/22/2024 12:02    Scheduled Meds:  Chlorhexidine Gluconate Cloth  6 each Topical Daily   fosfomycin  3 g Oral Once   heparin  5,000 Units Subcutaneous Q8H   loperamide HCl  2 mg Oral TID   potassium chloride SA  40 mEq Oral Q2H   sodium chloride flush  10-40 mL Intracatheter Q12H   Continuous Infusions:  sodium chloride 100 mL/hr at 02/22/24 2357   potassium chloride 10 mEq (02/23/24 1045)     LOS: 1 day   Time spent: 40 minutes  Carollee Herter, DO  [facility]  02/23/2024, 10:49 AM

## 2024-02-23 NOTE — TOC Transition Note (Signed)
 Transition of Care [facility]) - Discharge Note   Patient Details  Name: Cindy [name] MRN: [id number] Date of Birth: 11/03/1991  Transition of [facility]) CM/SW Contact:  Howell Rucks, RN Phone Number: 02/23/2024, 3:17 PM   Clinical Narrative:   Patient dc prior to [facility] initial assessment.           Patient Goals and CMS Choice            Discharge Placement                       Discharge Plan and Services Additional resources added to the After Visit Summary for                                       Social Drivers of Health (SDOH) Interventions SDOH Screenings   Food Insecurity: No Food Insecurity (02/22/2024)  Housing: Low Risk  (02/22/2024)  Transportation Needs: No Transportation Needs (02/22/2024)  Utilities: Not At Risk (02/22/2024)  Social Connections: Socially Integrated (02/22/2024)  Tobacco Use: Low Risk  (02/22/2024)     Readmission Risk Interventions     No data to display

## 2024-02-23 NOTE — Assessment & Plan Note (Addendum)
 02-23-2024 pt without any urinary symptoms. Give 1 dose of po fosfomycin. Stop IV rocephin.

## 2024-02-23 NOTE — Discharge Summary (Signed)
 [facility] Physician Discharge Summary   Patient name: Cindy [name]  Admit date:     02/22/2024  Discharge date: 02/23/2024  Attending Physician: Eduard Clos [[id number]]  Discharge Physician: Carollee Herter   PCP: Ovidio Kin, MD  Admitted From: Home  Disposition:  Home  Recommendations for Outpatient Follow-up:  Follow up with PCP in 1-2 weeks F/U with Dr. Ricci Barker every Friday for IV fluids in clinic  Home Health:No Equipment/Devices: None    Discharge Condition:Stable CODE STATUS:FULL Diet recommendation: Heart Healthy Fluid Restriction: None  Hospital Summary: HPI: Cindy [name] is a 33 y.o. 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  Patient states her ileostomy output has been increased.  Denies any nausea or vomiting.   ED Course: In the ER patient was hypotensive with a systolic in the 80s improved with fluid bolus.  CT abdomen pelvis shows nonspecific finding including possibility of peptic ulcer disease.  Creatinine was 4.9 sodium 128 potassium 3.4.  Patient's last creatinine on 01/29/24 was 2.7.  Patient was started on fluids admitted for acute on chronic kidney disease with hypovolemia contributing to hypotension.  Significant Events: Admitted 02/22/2024 for AKI on CKD stage 4   Significant Labs: WBC 13.4, HgB 12.8, Plt 429 Na 128, K 3.4, CO2 of 14, BUN 56, sCr 4.9. glu 79  Significant Imaging Studies:   Antibiotic Therapy: Anti-infectives (From admission, onward)    Start     Dose/Rate Route Frequency Ordered Stop   02/23/24 1600  cefTRIAXone (ROCEPHIN) 1 g in sodium chloride 0.9 % 100 mL IVPB        1 g 200 mL/hr over 30 Minutes Intravenous Every 24 hours 02/23/24 0442  02/28/24 1559   02/22/24 1715  cefTRIAXone (ROCEPHIN) 1 g in sodium chloride 0.9 % 100 mL IVPB        1 g 200 mL/hr over 30 Minutes Intravenous  Once 02/22/24 1709 02/22/24 1831       Procedures:   Consultants:    Hospital Course by Problem: * Acute renal failure superimposed on stage 4 chronic kidney disease (HCC) - baseline Scr 2.4-2.8 02-23-2024 due to high output ileostomy. AKI has improved. Pt wants to go home today. Tolerating solid food without difficulty. Pt states her ostomy output has already decreased.  **update. Discharge Scr 2.69.  UTI (urinary tract infection) 02-23-2024 pt without any urinary symptoms. Give 1 dose of po fosfomycin. Stop IV rocephin.  Hypomagnesemia 02-23-2024 replete with IV mg  Hypotension due to hypovolemia 02-23-2024 resolved with IVF.  Hypokalemia 02-23-2024 due to GI losses. Aggressive replacement. Repeat BMP at 12 pm.  *update Repeat BMP shows K of 3.2. pt wants to go home today for family function. Will give her 40 meq po kcl prior to discharge.  Chronic metabolic acidosis 02-23-2024 Outpatient nephrology notes that pt is persistently acidotic(metabolic) due to ongoing GI losses and her persistent metabolic acidosis is not improving with po bicarb.   Chronic hyponatremia - baseline Na 130 02-23-2024 chronic.  CKD (chronic kidney disease) stage 4, GFR 15-29 ml/min (HCC) - baseline Scr 2.4-2.8 02-23-2024 follows with [facility] nephrology Dr. Ricci Barker.    Discharge Diagnoses:  Principal Problem:   Acute renal failure superimposed on stage 4 chronic kidney disease (HCC) - baseline Scr 2.4-2.8 Active  Problems:   Hypokalemia   Hypotension due to hypovolemia   Hypomagnesemia   UTI (urinary tract infection)   CKD (chronic kidney disease) stage 4, GFR 15-29 ml/min (HCC) - baseline Scr 2.4-2.8   Chronic hyponatremia - baseline Na 130   Chronic metabolic acidosis   Discharge Instructions  Discharge Instructions      Call MD for:  extreme fatigue   Complete by: As directed    Call MD for:  persistant dizziness or light-headedness   Complete by: As directed    Call MD for:  persistant nausea and vomiting   Complete by: As directed    Diet - low sodium heart healthy   Complete by: As directed    Discharge instructions   Complete by: As directed    1. Follow up with your primary care provider in 1-2 weeks following discharge from hospital. 2. Follow up with nephrology(Dr. Ricci Barker) with [facility] as scheduled every Friday for IV fluids.   Increase activity slowly   Complete by: As directed       Allergies as of 02/23/2024       Reactions   Citrus Hives   Food Anaphylaxis, Other (See Comments)   Orange juice   Iron Palpitations, Other (See Comments)   Heart stops  **pt has tolerated IV Fe**   Iron Sucrose Anaphylaxis   She had anaphylaxis to venofier but was given ferrumoxytol in allergy clinic by Dr. Silverio Decamp and tolerated well in 2020   Orange Oil Anaphylaxis, Hives, Swelling   Ex: orange juice, oranges but "orange dye" and lemonade ok"   Vancomycin Anaphylaxis   Venofer [ferric Oxide] Anaphylaxis   Soap Hives, Itching, Other (See Comments)   Dove soap   Sulfamethoxazole-trimethoprim Nausea And Vomiting, Other (See Comments)   Patient developed AKI that appears to have been from bactrim. She might be able to take again if watched closely and it is absolutely needed - ie there was no anaphylaxis or allergic rash.   Levaquin [levofloxacin] Hives   Morphine Hives, Swelling, Other (See Comments)   When given IV, swelling around site   Sulfa Antibiotics Other (See Comments)   Dehydration        Medication List     TAKE these medications    famotidine 40 MG/5ML suspension Commonly known as: PEPCID Take 40 mg by mouth daily as needed for heartburn or indigestion.   Imodium A-D 1 MG/7.5ML solution Generic drug: loperamide HCl Take 30 mLs by mouth in the morning, at noon, in  the evening, and at bedtime.   potassium chloride SA 20 MEQ tablet Commonly known as: KLOR-CON M Take 1 tablet (20 mEq total) by mouth 3 (three) times daily.   sodium bicarbonate 325 MG tablet Take 325 mg by mouth daily.        Allergies  Allergen Reactions   Citrus Hives   Food Anaphylaxis and Other (See Comments)    Orange juice   Iron Palpitations and Other (See Comments)    Heart stops  **pt has tolerated IV Fe**   Iron Sucrose Anaphylaxis    She had anaphylaxis to venofier but was given ferrumoxytol in allergy clinic by Dr. Silverio Decamp and tolerated well in 2020   Orange Oil Anaphylaxis, Hives and Swelling    Ex: orange juice, oranges but "orange dye" and lemonade ok"   Vancomycin Anaphylaxis   Venofer [Ferric Oxide] Anaphylaxis   Soap Hives, Itching and Other (See Comments)    Dove soap   Sulfamethoxazole-Trimethoprim Nausea  And Vomiting and Other (See Comments)    Patient developed AKI that appears to have been from bactrim. She might be able to take again if watched closely and it is absolutely needed - ie there was no anaphylaxis or allergic rash.   Levaquin [Levofloxacin] Hives   Morphine Hives, Swelling and Other (See Comments)    When given IV, swelling around site   Sulfa Antibiotics Other (See Comments)    Dehydration     Discharge Exam: Vitals:   02/23/24 0900 02/23/24 1145  BP: (!) 131/51   Pulse: 91   Resp: 19   Temp:  98 F (36.7 C)  SpO2: 100%     Physical Exam Vitals and nursing note reviewed.  Constitutional:      General: She is not in acute distress.    Appearance: She is normal weight. She is not toxic-appearing or diaphoretic.  HENT:     Head: Normocephalic and atraumatic.     Nose: Nose normal.  Cardiovascular:     Rate and Rhythm: Normal rate and regular rhythm.  Pulmonary:     Effort: Pulmonary effort is normal.     Breath sounds: Normal breath sounds.  Abdominal:     General: Bowel sounds are normal.     Palpations: Abdomen  is soft.     Comments: Ileostomy. Small brown stool in bag  Musculoskeletal:     Right lower leg: No edema.     Left lower leg: No edema.  Skin:    Capillary Refill: Capillary refill takes less than 2 seconds.     Comments: Right ant chest port-a-cath  Neurological:     General: No focal deficit present.     Mental Status: She is alert and oriented to person, place, and time.     The results of significant diagnostics from this hospitalization (including imaging, microbiology, ancillary and laboratory) are listed below for reference.    Microbiology: Recent Results (from the past 240 hours)  Culture, blood (Routine X 2) w Reflex to ID Panel     Status: None (Preliminary result)   Collection Time: 02/22/24  4:55 PM   Specimen: BLOOD LEFT HAND  Result Value Ref Range Status   Specimen Description   Final    BLOOD LEFT HAND Performed at [facility], [address] [place], [place] [address]    Special Requests   Final    BOTTLES DRAWN AEROBIC AND ANAEROBIC Blood Culture results may not be optimal due to an inadequate volume of blood received in culture bottles Performed at [facility], [address] [place], [place] [address]    Culture   Final    NO GROWTH < 12 HOURS Performed at [facility], [address], [place], [place] [address]    Report Status PENDING  Incomplete  Culture, blood (Routine X 2) w Reflex to ID Panel     Status: None (Preliminary result)   Collection Time: 02/22/24  4:58 PM   Specimen: BLOOD  Result Value Ref Range Status   Specimen Description   Final    BLOOD RIGHT ANTECUBITAL Performed at [facility], [address] [place], [place] [address]    Special Requests   Final    BOTTLES DRAWN AEROBIC AND ANAEROBIC Blood Culture adequate volume Performed at [facility], [address] [place], [place] [address]    Culture   Final    NO GROWTH < 12  HOURS Performed at [facility],  [address] [place], [place] [address]    Report Status PENDING  Incomplete  MRSA Next Gen by PCR, Nasal     Status: None   Collection Time: 02/22/24  9:39 PM   Specimen: Nasal Mucosa; Nasal Swab  Result Value Ref Range Status   MRSA by PCR Next Gen NOT DETECTED NOT DETECTED Final    Comment: (NOTE) The GeneXpert MRSA Assay (FDA approved for NASAL specimens only), is one component of a comprehensive MRSA colonization surveillance program. It is not intended to diagnose MRSA infection nor to guide or monitor treatment for MRSA infections. Test performance is not FDA approved in patients less than 56 years old. Performed at [facility], [address] [place], [place] [address]      Labs:  Basic Metabolic Panel: Recent Labs  Lab 02/22/24 1040 02/22/24 1650 02/23/24 0545 02/23/24 1113  NA 128* 131* 135 137  K 3.4* 3.1* 2.7* 3.2*  CL 96* 106 114* 118*  CO2 14* 10* 12* 11*  GLUCOSE 79 91 100* 104*  BUN 56* 55* 44* 36*  CREATININE 4.90* 4.19* 3.16* 2.69*  CALCIUM 7.5* 6.6* 5.9* 6.1*  MG 1.8  --  1.4* 2.3   Liver Function Tests: Recent Labs  Lab 02/22/24 1040 02/23/24 0545  AST 15 11*  ALT 9 9  ALKPHOS 131* 78  BILITOT 0.4 0.2  PROT 8.1 5.7*  ALBUMIN 5.1* 3.0*   Recent Labs  Lab 02/22/24 1040  LIPASE 77*   CBC: Recent Labs  Lab 02/22/24 1040 02/23/24 0545  WBC 13.4* 8.5  NEUTROABS  --  6.8  HGB 12.8 8.8*  HCT 37.8 27.1*  MCV 82.5 87.4  PLT 429* 228   Urinalysis    Component Value Date/Time   COLORURINE YELLOW 02/22/2024 1008   APPEARANCEUR CLOUDY (A) 02/22/2024 1008   LABSPEC 1.015 02/22/2024 1008   PHURINE 5.5 02/22/2024 1008   GLUCOSEU NEGATIVE 02/22/2024 1008   HGBUR TRACE (A) 02/22/2024 1008   BILIRUBINUR NEGATIVE 02/22/2024 1008   KETONESUR NEGATIVE 02/22/2024 1008   PROTEINUR 30 (A) 02/22/2024 1008   UROBILINOGEN 0.2 07/29/2015 0320   NITRITE NEGATIVE 02/22/2024 1008   LEUKOCYTESUR LARGE (A) 02/22/2024 1008   Sepsis  Labs Recent Labs  Lab 02/22/24 1040 02/23/24 0545  WBC 13.4* 8.5    Procedures/Studies: CT ABDOMEN PELVIS WO CONTRAST Result Date: 02/22/2024 CLINICAL DATA:  RLQ abdominal pain ruq and rlq pai, n,v,d, hx of colon cancer sp colectomy with iliostomy bag. GFR <30 EXAM: CT ABDOMEN AND PELVIS WITHOUT CONTRAST TECHNIQUE: Multidetector CT imaging of the abdomen and pelvis was performed following the standard protocol without IV contrast. RADIATION DOSE REDUCTION: This exam was performed according to the departmental dose-optimization program which includes automated exposure control, adjustment of the mA and/or kV according to patient size and/or use of iterative reconstruction technique. COMPARISON:  CT 01/28/2024, right upper quadrant ultrasound earlier today FINDINGS: Lower chest: Clear lung bases. Hepatobiliary: Unremarkable unenhanced appearance of the liver. The gallbladder is decompressed. No biliary dilatation. Pancreas: Unremarkable unenhanced appearance. Spleen: Unremarkable unenhanced appearance. Adrenals/Urinary Tract: Normal adrenal glands. Unchanged prominence of the right renal pelvis. Punctate left renal calculus. There may be a punctate nonobstructing stone in the lower right kidney. No perinephric fat stranding. Small left renal cyst. No further follow-up imaging is recommended. Partially distended urinary bladder, normal for degree of distension. Stomach/Bowel: Right lower quadrant ileostomy will total colectomy. There is no obstruction, contrast reaches the ileostomy.  No small bowel obstruction, wall thickening or inflammation. Equivocal wall thickening about the stomach, contrast has passed beyond the stomach limiting assessment. Vascular/Lymphatic: Normal caliber abdominal aorta. No enlarged lymph nodes in the abdomen or pelvis. Reproductive: Anteverted uterus. Soft tissue prominence in the cervix is unchanged. Small amount of loculated fluid posterior left pelvis is unchanged from prior.  Other: Small amount of loculated fluid in the left pelvis is unchanged from prior exam. No abdominal ascites. No free air. No abdominal wall hernia. Musculoskeletal: There are no acute or suspicious osseous abnormalities. IMPRESSION: 1. Right lower quadrant ileostomy with total colectomy. No small bowel obstruction or inflammation. 2. Equivocal wall thickening about the stomach, contrast has passed beyond the stomach limiting assessment. Recommend correlation with symptoms of gastritis or peptic ulcer disease. 3. Unchanged prominence of the right renal pelvis. Punctate nonobstructing bilateral renal calculi. Electronically Signed   By: Narda Rutherford M.D.   On: 02/22/2024 15:56   US Abdomen Limited RUQ (LIVER/GB) Result Date: 02/22/2024 CLINICAL DATA:  Right upper quadrant pain with nausea for the past day. EXAM: ULTRASOUND ABDOMEN LIMITED RIGHT UPPER QUADRANT COMPARISON:  CT abdomen pelvis dated January 28, 2024. Renal ultrasound dated May 29, 2021. FINDINGS: Gallbladder: No gallstones or wall thickening visualized. 4 mm gallbladder polyp in the neck. No sonographic Murphy sign noted by sonographer. Common bile duct: Diameter: 3 mm, normal. Liver: No focal lesion identified. Within normal limits in parenchymal echogenicity. Portal vein is patent on color Doppler imaging with normal direction of blood flow towards the liver. Other: Echogenic right kidney. IMPRESSION: 1. No acute abnormality. 2. 4 mm gallbladder polyp. No follow-up needed. 3. Unchanged echogenic right kidney, suggesting medical renal disease. Electronically Signed   By: Obie Dredge M.D.   On: 02/22/2024 12:02   CT ABDOMEN PELVIS WO CONTRAST Result Date: 01/28/2024 CLINICAL DATA:  Acute abdominal pain. Patient reports increased output from ileostomy. EXAM: CT ABDOMEN AND PELVIS WITHOUT CONTRAST TECHNIQUE: Multidetector CT imaging of the abdomen and pelvis was performed following the standard protocol without IV contrast. RADIATION DOSE  REDUCTION: This exam was performed according to the departmental dose-optimization program which includes automated exposure control, adjustment of the mA and/or kV according to patient size and/or use of iterative reconstruction technique. COMPARISON:  CT 06/19/2023 at [facility] FINDINGS: Lower chest: Minimal scarring at the left lung base. No acute airspace disease or pleural effusion. Hepatobiliary: Unremarkable unenhanced appearance of the liver. Decompressed gallbladder. No calcified gallstone or pericholecystic inflammation. Pancreas: Unremarkable unenhanced appearance. Spleen: Unremarkable unenhanced appearance. Adrenals/Urinary Tract: Normal adrenal glands. Resolved hydronephrosis from prior exam. There is mild persistent prominence of the right renal pelvis. Punctate nonobstructing stone in the upper left kidney. Simple cyst in the lower left kidney. No further follow-up imaging is recommended. Decompressed ureters. No bladder wall thickening. Stomach/Bowel: Bowel assessment is limited in the absence of enteric contrast and paucity of intra-abdominal fat. Right lower quadrant ileostomy with total colectomy. Majority of the small bowel is decompressed. No obstruction or bowel inflammation. Question of distal gastric wall thickening. The stomach is moderately distended with fluid/ingested contents. Vascular/Lymphatic: Normal caliber abdominal aorta. No bulky abdominopelvic adenopathy. Reproductive: Soft tissue prominence in the region of the cervix. Anteverted uterus. No adnexal mass. Small amount of loculated fluid in the posterior left pelvis is unchanged from prior. Other: Small amount of loculated fluid in the posterior left pelvis. No ascites. No free air. Postsurgical change of the anterior abdominal wall without hernia. Musculoskeletal: There are no acute or suspicious osseous abnormalities.  IMPRESSION: 1. Right lower quadrant ileostomy, post colectomy. No small bowel obstruction or bowel  inflammation. 2. Question of distal gastric wall thickening, can be seen with gastritis or peptic ulcer disease. 3. Resolved hydronephrosis from prior exam. Mild persistent prominence of the right renal pelvis. Punctate nonobstructing left renal stone. 4. Soft tissue prominence in the region of the cervix. Recommend up-to-date Pap smear. Electronically Signed   By: Narda Rutherford M.D.   On: 01/28/2024 22:51    Time coordinating discharge: 45 mins  SIGNED:  Carollee Herter, DO [facility] 02/23/24, 12:46 PM

## 2024-02-23 NOTE — Assessment & Plan Note (Addendum)
 02-23-2024 due to high output ileostomy. AKI has improved. Pt wants to go home today. Tolerating solid food without difficulty. Pt states her ostomy output has already decreased.  **update. Discharge Scr 2.69.

## 2024-02-24 LAB — URINE CULTURE: Culture: >=100000 — AB

## 2024-02-27 LAB — CULTURE, BLOOD (ROUTINE X 2)
Culture: NO GROWTH
Culture: NO GROWTH
Special Requests: ADEQUATE

## 2024-05-10 ENCOUNTER — Encounter (HOSPITAL_BASED_OUTPATIENT_CLINIC_OR_DEPARTMENT_OTHER): Payer: Self-pay | Admitting: Emergency Medicine

## 2024-05-10 ENCOUNTER — Other Ambulatory Visit: Payer: Self-pay

## 2024-05-10 ENCOUNTER — Emergency Department (HOSPITAL_BASED_OUTPATIENT_CLINIC_OR_DEPARTMENT_OTHER)
Admission: EM | Admit: 2024-05-10 | Discharge: 2024-05-10 | Disposition: A | Discharge status: Home / Self Care | Payer: PRIVATE HEALTH INSURANCE | Attending: Emergency Medicine | Admitting: Emergency Medicine

## 2024-05-10 DIAGNOSIS — N189 Chronic kidney disease, unspecified: Secondary | ICD-10-CM | POA: Insufficient documentation

## 2024-05-10 DIAGNOSIS — E860 Dehydration: Secondary | ICD-10-CM | POA: Diagnosis not present

## 2024-05-10 DIAGNOSIS — E876 Hypokalemia: Secondary | ICD-10-CM | POA: Insufficient documentation

## 2024-05-10 DIAGNOSIS — E871 Hypo-osmolality and hyponatremia: Secondary | ICD-10-CM | POA: Insufficient documentation

## 2024-05-10 DIAGNOSIS — Z85038 Personal history of other malignant neoplasm of large intestine: Secondary | ICD-10-CM | POA: Diagnosis not present

## 2024-05-10 LAB — MAGNESIUM: Magnesium: 1.7 mg/dL (ref 1.7–2.4)

## 2024-05-10 LAB — CBC
HCT: 26.3 % — ABNORMAL LOW (ref 36.0–46.0)
Hemoglobin: 9.1 g/dL — ABNORMAL LOW (ref 12.0–15.0)
MCH: 26.5 pg (ref 26.0–34.0)
MCHC: 34.6 g/dL (ref 30.0–36.0)
MCV: 76.5 fL — ABNORMAL LOW (ref 80.0–100.0)
Platelets: 309 10*3/uL (ref 150–400)
RBC: 3.44 MIL/uL — ABNORMAL LOW (ref 3.87–5.11)
RDW: 13 % (ref 11.5–15.5)
WBC: 8.2 10*3/uL (ref 4.0–10.5)
nRBC: 0 % (ref 0.0–0.2)

## 2024-05-10 LAB — BASIC METABOLIC PANEL WITH GFR
Anion gap: 18 — ABNORMAL HIGH (ref 5–15)
Anion gap: 16 — ABNORMAL HIGH (ref 5–15)
BUN: 34 mg/dL — ABNORMAL HIGH (ref 6–20)
BUN: 31 mg/dL — ABNORMAL HIGH (ref 6–20)
CO2: 14 mmol/L — ABNORMAL LOW (ref 22–32)
CO2: 15 mmol/L — ABNORMAL LOW (ref 22–32)
Calcium: 8.8 mg/dL — ABNORMAL LOW (ref 8.9–10.3)
Calcium: 8.4 mg/dL — ABNORMAL LOW (ref 8.9–10.3)
Chloride: 99 mmol/L (ref 98–111)
Chloride: 102 mmol/L (ref 98–111)
Creatinine, Ser: 2.57 mg/dL — ABNORMAL HIGH (ref 0.44–1.00)
Creatinine, Ser: 2.34 mg/dL — ABNORMAL HIGH (ref 0.44–1.00)
GFR, Estimated: 24 mL/min — ABNORMAL LOW (ref >60)
GFR, Estimated: 27 mL/min — ABNORMAL LOW (ref >60)
Glucose, Bld: 88 mg/dL (ref 70–99)
Glucose, Bld: 156 mg/dL — ABNORMAL HIGH (ref 70–99)
Potassium: 2.4 mmol/L — CL (ref 3.5–5.1)
Potassium: 2.7 mmol/L — CL (ref 3.5–5.1)
Sodium: 132 mmol/L — ABNORMAL LOW (ref 135–145)
Sodium: 132 mmol/L — ABNORMAL LOW (ref 135–145)

## 2024-05-10 MED ORDER — LACTATED RINGERS IV BOLUS
1000.0000 mL | Freq: Once | INTRAVENOUS | Status: AC
  Administered 2024-05-10: 1000 mL via INTRAVENOUS

## 2024-05-10 MED ORDER — POTASSIUM CHLORIDE 10 MEQ/100ML IV SOLN
10.0000 meq | INTRAVENOUS | Status: AC
  Administered 2024-05-10 (×4): 10 meq via INTRAVENOUS
  Filled 2024-05-10 (×2): qty 100

## 2024-05-10 MED ORDER — POTASSIUM CHLORIDE CRYS ER 20 MEQ PO TBCR
40.0000 meq | EXTENDED_RELEASE_TABLET | Freq: Once | ORAL | Status: AC
  Administered 2024-05-10: 40 meq via ORAL
  Filled 2024-05-10: qty 2

## 2024-05-10 MED ORDER — SODIUM CHLORIDE 0.9% FLUSH
10.0000 mL | INTRAVENOUS | Status: DC | PRN
Start: 2024-05-10 — End: 2024-05-10

## 2024-05-10 MED ORDER — MAGNESIUM OXIDE -MG SUPPLEMENT 400 (240 MG) MG PO TABS
400.0000 mg | ORAL_TABLET | Freq: Once | ORAL | Status: AC
  Administered 2024-05-10: 400 mg via ORAL
  Filled 2024-05-10: qty 1

## 2024-05-10 NOTE — ED Notes (Signed)
 Portacath flushed with normal saline 10cc per pts and her chart information.

## 2024-05-10 NOTE — Discharge Instructions (Addendum)
 Your potassium and magnesium  was replenished orally and IV close are back to your baseline during her time in the emergency department.  Increase your potassium supplementation as directed by your nephrologist and follow-up closely with them in clinic.

## 2024-05-10 NOTE — ED Triage Notes (Signed)
 States K was 2.4 at PCP yesterday. States it runs low and never gets above 2.8 Had LR infusion done yesterday. Denies complaints.

## 2024-05-10 NOTE — ED Provider Notes (Signed)
 [place] EMERGENCY DEPARTMENT AT [facility] Provider Note   CSN: [id number] Arrival date & time: 05/10/24  0741     Patient presents with: Abnormal Labs   Cindy [name] is a 33 y.o. female.   HPI    33 year old female with medical history significant for familial adenomatous polyposis and subsequent development of colon cancer, status post subtotal colectomy and proctectomy end ileostomy with revision, chronic hypokalemia, nephrolithiasis, CKD, hypomagnesemia presenting to the emergency department with a chief complaint of low potassium.  The patient states that her potassium always runs less than 3.  She denies any difficulty with ostomy output.  She denies any abdominal pain or any complaints.  Her potassium was checked at her PCP office yesterday and was found to be 2.4 and she was advised to present to the emergency department for potassium supplementation.  She has no other complaints at this time. Prior to Admission medications   Medication Sig Start Date End Date Taking? Authorizing Provider  famotidine  (PEPCID ) 40 MG/5ML suspension Take 40 mg by mouth daily as needed for heartburn or indigestion.    [provider]  IMODIUM  A-D 1 MG/7.5ML solution Take 30 mLs by mouth in the morning, at noon, in the evening, and at bedtime.    [provider]  potassium chloride  SA (KLOR-CON  M) 20 MEQ tablet Take 1 tablet (20 mEq total) by mouth 3 (three) times daily. 02/23/24 03/24/24  Unk Garb, DO  sodium bicarbonate  325 MG tablet Take 325 mg by mouth daily.    [provider]    Allergies: Citrus, Food, Iron , Iron  sucrose, Orange oil, Vancomycin , Venofer [ferric oxide], Soap, Sulfamethoxazole -trimethoprim , Levaquin  [levofloxacin ], Morphine , and Sulfa  antibiotics    Review of Systems  All other systems reviewed and are negative.   Updated Vital Signs BP 97/62 (BP Location: Right Arm)   Pulse (!) 101   Temp 98.4 F (36.9 C) (Oral)   Resp 18    SpO2 100%   Physical Exam Vitals and nursing note reviewed.  Constitutional:      General: She is not in acute distress.    Appearance: She is well-developed.  HENT:     Head: Normocephalic and atraumatic.   Eyes:     Conjunctiva/sclera: Conjunctivae normal.    Cardiovascular:     Rate and Rhythm: Normal rate and regular rhythm.  Pulmonary:     Effort: Pulmonary effort is normal. No respiratory distress.     Breath sounds: Normal breath sounds.  Abdominal:     Palpations: Abdomen is soft.     Tenderness: There is no abdominal tenderness.     Comments: Ileostomy in place with standard appearing output, no surrounding tenderness, no erythema   Musculoskeletal:        General: No swelling.     Cervical back: Neck supple.   Skin:    General: Skin is warm and dry.     Capillary Refill: Capillary refill takes less than 2 seconds.   Neurological:     Mental Status: She is alert.   Psychiatric:        Mood and Affect: Mood normal.     (all labs ordered are listed, but only abnormal results are displayed) Labs Reviewed - No data to display  EKG: None  Radiology: No results found.   Procedures   Medications Ordered in the ED - No data to display  Clinical Course as of 05/10/24 1416  Sat May 10, 2024  0928 Potassium(!!): 2.4 [JL]  Clinical Course User Index [JL] Rosealee Concha, MD                                Medical Decision Making Amount and/or Complexity of Data Reviewed Labs: ordered. Decision-making details documented in ED Course.  Risk OTC drugs. Prescription drug management.   33 year old female with medical history significant for familial adenomatous polyposis and subsequent development of colon cancer, status post subtotal colectomy and proctectomy end ileostomy with revision, chronic hypokalemia, nephrolithiasis, CKD, hypomagnesemia presenting to the emergency department with a chief complaint of low potassium.  The patient states that her  potassium always runs less than 3.  She denies any difficulty with ostomy output.  She denies any abdominal pain or any complaints.  Her potassium was checked at her PCP office yesterday and was found to be 2.4 and she was advised to present to the emergency department for potassium supplementation.  She has no other complaints at this time.    {Document critical care time when appropriate  Document review of labs and clinical decision tools ie [id number], etc  Document your independent review of radiology images and any outside records  Document your discussion with family members, caretakers and with consultants  Document social determinants of health affecting pt's care  Document your decision making why or why not admission, treatments were needed:[id number]:::1}   Final diagnoses:  None    ED Discharge Orders     None

## 2024-05-10 NOTE — ED Notes (Signed)
 Dc instructions reviewed with patient. Patient voiced understanding. Dc with belongings.

## 2024-08-29 ENCOUNTER — Institutional Professional Consult (permissible substitution): Payer: PRIVATE HEALTH INSURANCE | Admitting: Physician Assistant

## 2024-09-10 ENCOUNTER — Other Ambulatory Visit: Payer: Self-pay

## 2024-09-10 ENCOUNTER — Encounter (HOSPITAL_BASED_OUTPATIENT_CLINIC_OR_DEPARTMENT_OTHER): Payer: Self-pay | Admitting: Emergency Medicine

## 2024-09-10 ENCOUNTER — Emergency Department (HOSPITAL_BASED_OUTPATIENT_CLINIC_OR_DEPARTMENT_OTHER)
Admission: EM | Admit: 2024-09-10 | Discharge: 2024-09-10 | Disposition: A | Discharge status: Home / Self Care | Payer: PRIVATE HEALTH INSURANCE | Attending: Emergency Medicine | Admitting: Emergency Medicine

## 2024-09-10 DIAGNOSIS — R000 Tachycardia, unspecified: Secondary | ICD-10-CM | POA: Insufficient documentation

## 2024-09-10 DIAGNOSIS — R944 Abnormal results of kidney function studies: Secondary | ICD-10-CM | POA: Diagnosis not present

## 2024-09-10 DIAGNOSIS — E876 Hypokalemia: Secondary | ICD-10-CM | POA: Diagnosis not present

## 2024-09-10 DIAGNOSIS — N184 Chronic kidney disease, stage 4 (severe): Secondary | ICD-10-CM | POA: Diagnosis not present

## 2024-09-10 DIAGNOSIS — E860 Dehydration: Secondary | ICD-10-CM | POA: Insufficient documentation

## 2024-09-10 DIAGNOSIS — Z79899 Other long term (current) drug therapy: Secondary | ICD-10-CM | POA: Insufficient documentation

## 2024-09-10 LAB — CBC WITH DIFFERENTIAL/PLATELET
Abs Immature Granulocytes: 0.03 K/uL (ref 0.00–0.07)
Basophils Absolute: 0.1 K/uL (ref 0.0–0.1)
Basophils Relative: 1 %
Eosinophils Absolute: 0.3 K/uL (ref 0.0–0.5)
Eosinophils Relative: 3 %
HCT: 24.7 % — ABNORMAL LOW (ref 36.0–46.0)
Hemoglobin: 7.8 g/dL — ABNORMAL LOW (ref 12.0–15.0)
Immature Granulocytes: 0 %
Lymphocytes Relative: 12 %
Lymphs Abs: 1.1 K/uL (ref 0.7–4.0)
MCH: 23.5 pg — ABNORMAL LOW (ref 26.0–34.0)
MCHC: 31.6 g/dL (ref 30.0–36.0)
MCV: 74.4 fL — ABNORMAL LOW (ref 80.0–100.0)
Monocytes Absolute: 0.5 K/uL (ref 0.1–1.0)
Monocytes Relative: 5 %
Neutro Abs: 6.8 K/uL (ref 1.7–7.7)
Neutrophils Relative %: 79 %
Platelets: 372 K/uL (ref 150–400)
RBC: 3.32 MIL/uL — ABNORMAL LOW (ref 3.87–5.11)
RDW: 14.9 % (ref 11.5–15.5)
WBC: 8.7 K/uL (ref 4.0–10.5)
nRBC: 0 % (ref 0.0–0.2)

## 2024-09-10 LAB — COMPREHENSIVE METABOLIC PANEL WITH GFR
ALT: 8 U/L (ref 0–44)
AST: 18 U/L (ref 15–41)
Albumin: 4 g/dL (ref 3.5–5.0)
Alkaline Phosphatase: 100 U/L (ref 38–126)
Anion gap: 17 — ABNORMAL HIGH (ref 5–15)
BUN: 20 mg/dL (ref 6–20)
CO2: 18 mmol/L — ABNORMAL LOW (ref 22–32)
Calcium: 9.1 mg/dL (ref 8.9–10.3)
Chloride: 99 mmol/L (ref 98–111)
Creatinine, Ser: 2.24 mg/dL — ABNORMAL HIGH (ref 0.44–1.00)
GFR, Estimated: 28 mL/min — ABNORMAL LOW (ref >60)
Glucose, Bld: 138 mg/dL — ABNORMAL HIGH (ref 70–99)
Potassium: 2.5 mmol/L — CL (ref 3.5–5.1)
Sodium: 134 mmol/L — ABNORMAL LOW (ref 135–145)
Total Bilirubin: 0.3 mg/dL (ref 0.0–1.2)
Total Protein: 7.1 g/dL (ref 6.5–8.1)

## 2024-09-10 LAB — BASIC METABOLIC PANEL WITH GFR
Anion gap: 12 (ref 5–15)
BUN: 21 mg/dL — ABNORMAL HIGH (ref 6–20)
CO2: 19 mmol/L — ABNORMAL LOW (ref 22–32)
Calcium: 8.1 mg/dL — ABNORMAL LOW (ref 8.9–10.3)
Chloride: 101 mmol/L (ref 98–111)
Creatinine, Ser: 1.96 mg/dL — ABNORMAL HIGH (ref 0.44–1.00)
GFR, Estimated: 33 mL/min — ABNORMAL LOW (ref >60)
Glucose, Bld: 117 mg/dL — ABNORMAL HIGH (ref 70–99)
Potassium: 3.1 mmol/L — ABNORMAL LOW (ref 3.5–5.1)
Sodium: 132 mmol/L — ABNORMAL LOW (ref 135–145)

## 2024-09-10 LAB — MAGNESIUM: Magnesium: 1.6 mg/dL — ABNORMAL LOW (ref 1.7–2.4)

## 2024-09-10 MED ORDER — LACTATED RINGERS IV BOLUS
1000.0000 mL | Freq: Once | INTRAVENOUS | Status: AC
  Administered 2024-09-10: 1000 mL via INTRAVENOUS

## 2024-09-10 MED ORDER — POTASSIUM CHLORIDE 20 MEQ PO PACK: 40.0000 meq | PACK | Freq: Once | ORAL | Status: DC

## 2024-09-10 MED ORDER — SODIUM CHLORIDE 0.9 % IV BOLUS
500.0000 mL | Freq: Once | INTRAVENOUS | Status: AC
  Administered 2024-09-10: 500 mL via INTRAVENOUS

## 2024-09-10 MED ORDER — HEPARIN SOD (PORK) LOCK FLUSH 100 UNIT/ML IV SOLN
INTRAVENOUS | Status: AC
  Administered 2024-09-10: 500 [IU]
  Filled 2024-09-10: qty 5

## 2024-09-10 MED ORDER — POTASSIUM CHLORIDE CRYS ER 20 MEQ PO TBCR
40.0000 meq | EXTENDED_RELEASE_TABLET | Freq: Once | ORAL | Status: AC
  Administered 2024-09-10: 40 meq via ORAL
  Filled 2024-09-10: qty 2

## 2024-09-10 MED ORDER — MAGNESIUM OXIDE -MG SUPPLEMENT 400 (240 MG) MG PO TABS
400.0000 mg | ORAL_TABLET | Freq: Once | ORAL | Status: AC
  Administered 2024-09-10: 400 mg via ORAL
  Filled 2024-09-10 (×2): qty 1

## 2024-09-10 MED ORDER — FENTANYL CITRATE (PF) 50 MCG/ML IJ SOSY
25.0000 ug | PREFILLED_SYRINGE | Freq: Once | INTRAMUSCULAR | Status: AC
  Administered 2024-09-10: 25 ug via INTRAVENOUS
  Filled 2024-09-10: qty 1

## 2024-09-10 MED ORDER — POTASSIUM CHLORIDE 10 MEQ/100ML IV SOLN
10.0000 meq | INTRAVENOUS | Status: AC
  Administered 2024-09-10 (×4): 10 meq via INTRAVENOUS
  Filled 2024-09-10 (×4): qty 100

## 2024-09-10 NOTE — ED Provider Notes (Signed)
 Cindy [name] Provider Note   CSN: [id number] Arrival date & time: 09/10/24  9061     Patient presents with: Dehydration   Cindy [name] is a 33 y.o. female.  Patient with past history significant for ileostomy, hypokalemia, CKD stage IV, prolonged QT, hypomagnesemia presents ED with concerns of dehydration.  She reports concerns that she may be getting dehydrated and that she has had a headache, abdominal cramping, and increased ileostomy output.  She reports similar symptoms in the past leading to quick and profound dehydration.  She denies any vomiting but slight nausea.  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  Denies any fever, cough, congestion, or sore throat.  HPI     Prior to Admission medications   Medication Sig Start Date End Date Taking? Authorizing Provider  potassium chloride  20 MEQ/15ML (10%) SOLN Take 30.6667 mEq by mouth. 04/11/24  Yes [provider]  sodium bicarbonate  650 MG tablet Take 650 mg by mouth. 04/11/24  Yes [provider]  famotidine  (PEPCID ) 40 MG/5ML suspension Take 40 mg by mouth daily as needed for heartburn or indigestion.    [provider]  IMODIUM  A-D 1 MG/7.5ML solution Take 30 mLs by mouth in the morning, at noon, in the evening, and at bedtime.    [provider]  potassium chloride  SA (KLOR-CON  M) 20 MEQ tablet Take 1 tablet (20 mEq total) by mouth 3 (three) times daily. 02/23/24 03/24/24  Laurence Locus, DO  sodium bicarbonate  325 MG tablet Take 325 mg by mouth daily.    [provider]    Allergies: Citrus, Food, Iron , Iron  sucrose, Orange oil, Vancomycin , Venofer [ferric oxide], Soap, Sulfamethoxazole -trimethoprim , Levaquin  [levofloxacin ], Morphine , and Sulfa  antibiotics    Review of Systems  Constitutional:  Positive for fatigue.  Gastrointestinal:  Positive  for abdominal pain.  All other systems reviewed and are negative.   Updated Vital Signs BP (!) 106/57 (BP Location: Right Arm)   Pulse (!) 104   Temp 98.9 F (37.2 C)   Resp 16   Ht 5' (1.524 m)   Wt 49.9 kg   SpO2 100%   BMI 21.48 kg/m   Physical Exam Vitals and nursing note reviewed.  Constitutional:      General: She is not in acute distress.    Appearance: Normal appearance. She is well-developed. She is not ill-appearing.  HENT:     Head: Normocephalic and atraumatic.  Eyes:     Conjunctiva/sclera: Conjunctivae normal.  Cardiovascular:     Rate and Rhythm: Normal rate and regular rhythm.     Heart sounds: No murmur heard. Pulmonary:     Effort: Pulmonary effort is normal. No respiratory distress.     Breath sounds: Normal breath sounds.  Abdominal:     General: Abdomen is flat. Bowel sounds are normal. There is no distension.     Palpations: Abdomen is soft.     Tenderness: There is no abdominal tenderness. There is no guarding or rebound.     Comments: Ileostomy site without noticeable erythema, purulent drainage, or protrusion. Watery brown output present.  Musculoskeletal:        General: No swelling.     Cervical back: Neck supple.  Skin:    General: Skin is warm and dry.     Capillary Refill: Capillary refill takes 2 to 3 seconds.  Neurological:  Mental Status: She is alert.  Psychiatric:        Mood and Affect: Mood normal.     (all labs ordered are listed, but only abnormal results are displayed) Labs Reviewed  CBC WITH DIFFERENTIAL/PLATELET - Abnormal; Notable for the following components:      Result Value   RBC 3.32 (*)    Hemoglobin 7.8 (*)    HCT 24.7 (*)    MCV 74.4 (*)    MCH 23.5 (*)    All other components within normal limits  COMPREHENSIVE METABOLIC PANEL WITH GFR - Abnormal; Notable for the following components:   Sodium 134 (*)    Potassium 2.5 (*)    CO2 18 (*)    Glucose, Bld 138 (*)    Creatinine, Ser 2.24 (*)    GFR,  Estimated 28 (*)    Anion gap 17 (*)    All other components within normal limits  MAGNESIUM  - Abnormal; Notable for the following components:   Magnesium  1.6 (*)    All other components within normal limits  BASIC METABOLIC PANEL WITH GFR - Abnormal; Notable for the following components:   Sodium 132 (*)    Potassium 3.1 (*)    CO2 19 (*)    Glucose, Bld 117 (*)    BUN 21 (*)    Creatinine, Ser 1.96 (*)    Calcium  8.1 (*)    GFR, Estimated 33 (*)    All other components within normal limits    EKG: EKG Interpretation Date/Time:  Wednesday September 10 2024 09:56:34 EDT Ventricular Rate:  100 PR Interval:  159 QRS Duration:  80 QT Interval:  347 QTC Calculation: 448 R Axis:   72  Text Interpretation: Sinus tachycardia Consider left ventricular hypertrophy Baseline wander in lead(s) V1 Confirmed by Yolande Charleston [phone]) on 09/10/2024 12:01:07 PM  Radiology: No results found.   Procedures   Medications Ordered in the ED  potassium chloride  10 mEq in 100 mL IVPB (10 mEq Intravenous New Bag/Given 09/10/24 1438)  lactated ringers  bolus 1,000 mL (0 mLs Intravenous Stopped 09/10/24 1125)  fentaNYL  (SUBLIMAZE ) injection 25 mcg (25 mcg Intravenous Given 09/10/24 1022)  magnesium  oxide (MAG-OX) tablet 400 mg (400 mg Oral Given 09/10/24 1150)  sodium chloride  0.9 % bolus 500 mL (500 mLs Intravenous New Bag/Given 09/10/24 1123)  potassium chloride  SA (KLOR-CON  M) CR tablet 40 mEq (40 mEq Oral Given 09/10/24 1332)                                    Medical Decision Making Amount and/or Complexity of Data Reviewed Labs: ordered.  Risk OTC drugs. Prescription drug management.   This patient presents to the ED for concern of dehydration, abdominal pain, this involves an extensive number of treatment options, and is a complaint that carries with it a high risk of complications and morbidity.  The differential diagnosis includes complications of ileostomy, bowel obstruction,  gastroenteritis, hypokalemia   Co morbidities that complicate the patient evaluation  Microcytic anemia, high-output ileostomy   Lab Tests:  I Ordered, and personally interpreted labs.  The pertinent results include: CBC unremarkable with no anemia patient recently declining with hemoglobin at 7.8, CMP shows hypokalemia at 2.5 with creatinine elevation of 2.24, BMP shows improved potassium at 3.1 after administration of oral and IV potassium, magnesium  low at 1.6   Cardiac Monitoring: / EKG:  The patient was maintained on a  cardiac monitor.  I personally viewed and interpreted the cardiac monitored which showed an underlying rhythm of: Sinus tachycardia   Consultations Obtained:  I requested consultation with none,  and discussed lab and imaging findings as well as pertinent plan - they recommend: N/A   Problem List / ED Course / Critical interventions / Medication management  Patient with past history significant for CKD, ileostomy placement presents ED with concerns of dehydration.  States that she has been having high output from her ileostomy site for the last day or so.  Denies any bloody discharge or drainage.  No fevers, chills, body aches, cough, sore throat, or congestion.  Denies any sick contacts but she does work in [employer], music. On exam, patient is no focal abdominal tenderness.  Ileostomy site appears to be noninflamed with no drainage present beyond ileostomy output. Labs show hypokalemia with potassium of 2.4 worsening hydration creatinine of 2.24.  BMP after potassium resuscitation has improved up to 3.1.  CBC shows hemoglobin at 7.8 with patient's baseline anemia present.  Magnesium  low at 1.6 repleted with 400 mg of magnesium  oxide. After interventions, patient reports improvement in symptoms.  Denies any abdominal pain at this time.  Will plan on discharge home with outpatient follow-up and advised continuation of her potassium supplementation at home to reduce risk of  complications of hyperkalemia.  Patient verbalized understanding and agreement.  Discharged home in stable condition. I ordered medication including fluids, potassium, magnesium  oxide, fentanyl  for hydration, hypokalemia, hypomagnesemia, pain Reevaluation of the patient after these medicines showed that the patient improved I have reviewed the patients home medicines and have made adjustments as needed   Social Determinants of Health:  None   Test / Admission - Considered:  Considered but stable for outpatient follow-up.  Final diagnoses:  Hypokalemia due to excessive gastrointestinal loss of potassium  Dehydration    ED Discharge Orders     None          Cecily Legrand LABOR, PA-C 09/10/24 1527    Yolande Lamar BROCKS, MD 09/11/24 [phone]

## 2024-09-10 NOTE — Discharge Instructions (Addendum)
 You were seen in the ER today for concerns of dehydration. You were found to have a fairly large decline in your potassium level likely from the increased output you were noticing from your ileostomy. This was corrected while you were here in the ER. I would keep an eye on your symptoms over the next few days and return to the ER if you notice any worsening symptoms. Continue with your potassium supplement at home as previously instructed and try to take small sips of fluids to avoid worsening dehydration.

## 2024-09-10 NOTE — ED Notes (Signed)
 DC paperwork given and verbally understood.

## 2024-09-10 NOTE — ED Triage Notes (Signed)
 Pt caox4 ambulatory c/o feeling dehydrated with headache and abd cramping further stating she has ileostomy and has had more output than normal and s/s are consistent with when she has gotten dehydrated in the past.

## 2024-10-10 NOTE — Progress Notes (Signed)
 Critical lab called received on 10/10/24 overnight.  Potassium 2.9, improved from prior.  Known issue for patient, currently on potassium supplementation 20meq TID  Patient called evening of critical lab result.  No answer and VM left to inform her of level and to ensure she takes her RX potassium supplementation and eat potassium rich food (potatoes, bananas, etc) advised to reach out with any questions, concerns, or issues.   Will inform primary nephrologist for follow up

## 2024-10-10 NOTE — Progress Notes (Addendum)
 Ordering Provider: Dr. RONAL Grade    Direct Supervision: Dr. ROSEMARIE Custard    Cindy [name] arrived A&Ox4, via Ambulation per self. C/o Dehydration, pain 0/10.  Vitals:   10/10/24 1409 10/10/24 1557  BP: 100/68 91/63  BP Location: Right arm   Patient Position: Sitting   Pulse: (!) 117 108  Resp: 17   SpO2: 98% 98%     V access: R CW DL Port Lumen 1 accessed on 1st attempt w 20g 1 needle. Good blood return, flushes easily.    1.5 Lactated Ringers  Infusion Start time: 1420 Stop time: 1557 Lot: [id number]  Exp: 05/26/2025   Waste Amount: 0 mL  Tolerated infusion well and without difficulty. Reports pain 0/10 after IV fluid and medications.   Port De-accessed after Infusion complete. Line flushed with 20mL NS. Swift blood return noted,. Transparent dressing removed and port deaccessed. Dry gauze and bandage placed over insertion site. No s/sxs of complications or infection noted.   Patient advised to call office with any questions or concerns.   Discharged via Ambulation per normal self. Driven home by self.

## 2024-12-03 ENCOUNTER — Encounter (HOSPITAL_COMMUNITY): Payer: Self-pay | Admitting: Family Medicine

## 2024-12-03 ENCOUNTER — Emergency Department (HOSPITAL_BASED_OUTPATIENT_CLINIC_OR_DEPARTMENT_OTHER)

## 2024-12-03 ENCOUNTER — Inpatient Hospital Stay (HOSPITAL_BASED_OUTPATIENT_CLINIC_OR_DEPARTMENT_OTHER)
Admission: EM | Admit: 2024-12-03 | Discharge: 2024-12-05 | DRG: 438 | Disposition: A | Discharge status: Home / Self Care | Attending: Internal Medicine | Admitting: Internal Medicine

## 2024-12-03 ENCOUNTER — Other Ambulatory Visit: Payer: Self-pay

## 2024-12-03 DIAGNOSIS — Z23 Encounter for immunization: Secondary | ICD-10-CM

## 2024-12-03 DIAGNOSIS — Z91048 Other nonmedicinal substance allergy status: Secondary | ICD-10-CM

## 2024-12-03 DIAGNOSIS — R109 Unspecified abdominal pain: Principal | ICD-10-CM

## 2024-12-03 DIAGNOSIS — J189 Pneumonia, unspecified organism: Secondary | ICD-10-CM | POA: Insufficient documentation

## 2024-12-03 DIAGNOSIS — Z882 Allergy status to sulfonamides status: Secondary | ICD-10-CM

## 2024-12-03 DIAGNOSIS — N189 Chronic kidney disease, unspecified: Secondary | ICD-10-CM | POA: Diagnosis present

## 2024-12-03 DIAGNOSIS — D649 Anemia, unspecified: Secondary | ICD-10-CM | POA: Diagnosis present

## 2024-12-03 DIAGNOSIS — Z85038 Personal history of other malignant neoplasm of large intestine: Secondary | ICD-10-CM

## 2024-12-03 DIAGNOSIS — Z885 Allergy status to narcotic agent status: Secondary | ICD-10-CM

## 2024-12-03 DIAGNOSIS — N1831 Chronic kidney disease, stage 3a: Secondary | ICD-10-CM | POA: Diagnosis present

## 2024-12-03 DIAGNOSIS — Z888 Allergy status to other drugs, medicaments and biological substances status: Secondary | ICD-10-CM

## 2024-12-03 DIAGNOSIS — D509 Iron deficiency anemia, unspecified: Secondary | ICD-10-CM | POA: Diagnosis present

## 2024-12-03 DIAGNOSIS — Z820 Family history of epilepsy and other diseases of the nervous system: Secondary | ICD-10-CM

## 2024-12-03 DIAGNOSIS — N179 Acute kidney failure, unspecified: Secondary | ICD-10-CM | POA: Diagnosis present

## 2024-12-03 DIAGNOSIS — Z881 Allergy status to other antibiotic agents status: Secondary | ICD-10-CM

## 2024-12-03 DIAGNOSIS — E876 Hypokalemia: Secondary | ICD-10-CM | POA: Diagnosis present

## 2024-12-03 DIAGNOSIS — Z560 Unemployment, unspecified: Secondary | ICD-10-CM

## 2024-12-03 DIAGNOSIS — D1391 Familial adenomatous polyposis: Secondary | ICD-10-CM | POA: Diagnosis present

## 2024-12-03 DIAGNOSIS — Z79899 Other long term (current) drug therapy: Secondary | ICD-10-CM

## 2024-12-03 DIAGNOSIS — E8721 Acute metabolic acidosis: Secondary | ICD-10-CM | POA: Diagnosis present

## 2024-12-03 DIAGNOSIS — Z932 Ileostomy status: Secondary | ICD-10-CM

## 2024-12-03 DIAGNOSIS — J180 Bronchopneumonia, unspecified organism: Secondary | ICD-10-CM | POA: Diagnosis present

## 2024-12-03 DIAGNOSIS — N184 Chronic kidney disease, stage 4 (severe): Secondary | ICD-10-CM | POA: Diagnosis present

## 2024-12-03 DIAGNOSIS — E871 Hypo-osmolality and hyponatremia: Secondary | ICD-10-CM | POA: Diagnosis present

## 2024-12-03 DIAGNOSIS — K859 Acute pancreatitis without necrosis or infection, unspecified: Principal | ICD-10-CM | POA: Diagnosis present

## 2024-12-03 DIAGNOSIS — Z9049 Acquired absence of other specified parts of digestive tract: Secondary | ICD-10-CM

## 2024-12-03 LAB — COMPREHENSIVE METABOLIC PANEL WITH GFR
ALT: 9 U/L (ref 0–44)
AST: 16 U/L (ref 15–41)
Albumin: 4.4 g/dL (ref 3.5–5.0)
Alkaline Phosphatase: 151 U/L — ABNORMAL HIGH (ref 38–126)
Anion gap: 17 — ABNORMAL HIGH (ref 5–15)
BUN: 34 mg/dL — ABNORMAL HIGH (ref 6–20)
CO2: 19 mmol/L — ABNORMAL LOW (ref 22–32)
Calcium: 9.3 mg/dL (ref 8.9–10.3)
Chloride: 95 mmol/L — ABNORMAL LOW (ref 98–111)
Creatinine, Ser: 2.55 mg/dL — ABNORMAL HIGH (ref 0.44–1.00)
GFR, Estimated: 24 mL/min — ABNORMAL LOW
Glucose, Bld: 101 mg/dL — ABNORMAL HIGH (ref 70–99)
Potassium: 3.2 mmol/L — ABNORMAL LOW (ref 3.5–5.1)
Sodium: 131 mmol/L — ABNORMAL LOW (ref 135–145)
Total Bilirubin: 0.2 mg/dL (ref 0.0–1.2)
Total Protein: 8.1 g/dL (ref 6.5–8.1)

## 2024-12-03 LAB — CBC
HCT: 29.4 % — ABNORMAL LOW (ref 36.0–46.0)
Hemoglobin: 9.3 g/dL — ABNORMAL LOW (ref 12.0–15.0)
MCH: 21.5 pg — ABNORMAL LOW (ref 26.0–34.0)
MCHC: 31.6 g/dL (ref 30.0–36.0)
MCV: 67.9 fL — ABNORMAL LOW (ref 80.0–100.0)
Platelets: 481 K/uL — ABNORMAL HIGH (ref 150–400)
RBC: 4.33 MIL/uL (ref 3.87–5.11)
RDW: 15.4 % (ref 11.5–15.5)
WBC: 9.5 K/uL (ref 4.0–10.5)
nRBC: 0 % (ref 0.0–0.2)

## 2024-12-03 LAB — URINALYSIS, ROUTINE W REFLEX MICROSCOPIC
Bilirubin Urine: NEGATIVE
Glucose, UA: NEGATIVE mg/dL
Hgb urine dipstick: NEGATIVE
Ketones, ur: NEGATIVE mg/dL
Leukocytes,Ua: NEGATIVE
Nitrite: NEGATIVE
Specific Gravity, Urine: 1.02 (ref 1.005–1.030)
pH: 6 (ref 5.0–8.0)

## 2024-12-03 LAB — PREGNANCY, URINE: Preg Test, Ur: NEGATIVE

## 2024-12-03 LAB — LIPASE, BLOOD: Lipase: 590 U/L — ABNORMAL HIGH (ref 11–51)

## 2024-12-03 LAB — LACTIC ACID, PLASMA: Lactic Acid, Venous: 1.2 mmol/L (ref 0.5–1.9)

## 2024-12-03 MED ORDER — FENTANYL CITRATE (PF) 50 MCG/ML IJ SOSY
25.0000 ug | PREFILLED_SYRINGE | Freq: Once | INTRAMUSCULAR | Status: DC
  Filled 2024-12-03: qty 1

## 2024-12-03 MED ORDER — LACTATED RINGERS IV BOLUS
1000.0000 mL | Freq: Once | INTRAVENOUS | Status: AC
  Administered 2024-12-03: 1000 mL via INTRAVENOUS

## 2024-12-03 MED ORDER — CHLORHEXIDINE GLUCONATE CLOTH 2 % EX PADS
6.0000 | MEDICATED_PAD | Freq: Every day | CUTANEOUS | Status: DC
  Administered 2024-12-04 – 2024-12-05 (×2): 6 via TOPICAL

## 2024-12-03 MED ORDER — LORAZEPAM 2 MG/ML IJ SOLN
0.5000 mg | Freq: Once | INTRAMUSCULAR | Status: AC
  Filled 2024-12-03: qty 1

## 2024-12-03 MED ORDER — LORAZEPAM 2 MG/ML IJ SOLN
INTRAMUSCULAR | Status: AC
  Administered 2024-12-03: 0.5 mg via INTRAVENOUS
  Filled 2024-12-03: qty 1

## 2024-12-03 MED ORDER — FENTANYL CITRATE (PF) 50 MCG/ML IJ SOSY
25.0000 ug | PREFILLED_SYRINGE | Freq: Once | INTRAMUSCULAR | Status: AC
  Administered 2024-12-03: 25 ug via INTRAVENOUS
  Filled 2024-12-03: qty 1

## 2024-12-03 MED ORDER — SODIUM CHLORIDE 0.9% FLUSH
10.0000 mL | Freq: Two times a day (BID) | INTRAVENOUS | Status: DC
  Administered 2024-12-04 – 2024-12-05 (×3): 10 mL

## 2024-12-03 NOTE — ED Notes (Signed)
 Patient transported to CT

## 2024-12-03 NOTE — ED Triage Notes (Signed)
 Pt reports diffuse abdominal pain since waking this morning, along with nausea and feeling dehydrated.  Pt has ileostomy bag for 10 years and denies any known issue with it at present.  Pt denies fever.  AAOx4 in triage, NAD.

## 2024-12-03 NOTE — Progress Notes (Signed)
 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   She is afebrile. SCr is 2.55 and lipase 590. There are no acute intraabdominal/pelvic findings on CT but possible LLL bronchopneumonia noted.   She was given 2 liters LR, fentanyl , and Ativan .   Plan of care: The patient is accepted for admission to Telemetry unit, at [facility].   Author: Evalene GORMAN Sprinkles, MD 12/03/2024  Check [email or web address] for on-call coverage.  Nursing staff, Please call [facility] Admits & Consults System-Wide number on Amion as soon as patient's arrival, so appropriate admitting provider can evaluate the pt.

## 2024-12-03 NOTE — ED Notes (Signed)
 Unable to obtain blood samples in triage. Pt has an implanted port which can be accessed once placed in treatment room.

## 2024-12-03 NOTE — ED Notes (Signed)
 Pt given peanut butter crackers and water  for PO challenge per hospitalist request. Pt tolerated well. PA made aware.

## 2024-12-03 NOTE — Progress Notes (Signed)
 Pt has arrived to unit, room 1320 by [facility], via stretcher. Pt is warm, dry, no visible distress. Flat affect and limited conversation with staff. [facility] admission has been paged and spoke with staff to inform that pt has arrived to unit, room 1320. Staff shared that the provider will be paged. Informed the patient of this and that currently, there are no orders and nurse will either wait for provider to enter orders for her and or for them to see her on the unit first and then orders. SWOT nurse in room with pt to complete admission.

## 2024-12-03 NOTE — ED Notes (Signed)
 [facility] at bedside

## 2024-12-03 NOTE — ED Provider Notes (Signed)
 " [place] EMERGENCY DEPARTMENT AT [facility] Provider Note   CSN: [id number] Arrival date & time: 12/03/24  1152     Patient presents with: Abdominal Pain   Cindy [name] is a 34 y.o. 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  The pain is described as intense sharp/cramping pain, constant, located in epigastric, with no clear radiation.  The patient reports associated symptoms including nausea with a couple episodes of vomiting but denies fever, chills, chest pain, shortness of breath, persistent vomiting, hematemesis, melena, hematochezia, dysuria, hematuria.  There is no history of recent abdominal trauma. The patient reports no recent travel, antibiotic use, or known sick contacts.  Oral intake has been decreased and patient states she feels very dehydrated. She also states that she noticed last night that her ileostomy bag appeared more full than her normal.  No alleviating or exacerbating factors were clearly identified.  The patient is in no acute distress.     Abdominal Pain      Prior to Admission medications  Medication Sig Start Date End Date Taking? Authorizing Provider  famotidine  (PEPCID ) 40 MG/5ML suspension Take 40 mg by mouth daily as needed for heartburn or indigestion.    [provider]  IMODIUM  A-D 1 MG/7.5ML solution Take 30 mLs by mouth in the morning, at noon, in the evening, and at bedtime.    [provider]  potassium chloride  20 MEQ/15ML (10%) SOLN Take 30.6667 mEq by mouth. 04/11/24   [provider]  potassium chloride  SA (KLOR-CON  M) 20 MEQ tablet Take 1 tablet (20 mEq total) by mouth 3 (three) times daily. 02/23/24 03/24/24  Laurence Locus, DO  sodium bicarbonate  325 MG tablet Take 325 mg by mouth daily.    [provider]  sodium bicarbonate  650 MG tablet  Take 650 mg by mouth. 04/11/24   [provider]    Allergies: Citrus, Food, Iron , Iron  sucrose, Orange oil, Vancomycin , Venofer [ferric oxide], Soap, Sulfamethoxazole -trimethoprim , Levaquin  [levofloxacin ], Morphine , and Sulfa  antibiotics    Review of Systems  Gastrointestinal:  Positive for abdominal pain.    Updated Vital Signs BP 110/70   Pulse (!) 101   Temp 98.5 F (36.9 C) (Oral)   Resp 15   SpO2 100%   Physical Exam Vitals and nursing note reviewed.  Constitutional:      General: She is not in acute distress.    Appearance: Normal appearance.  HENT:     Head: Normocephalic and atraumatic.  Eyes:     Extraocular Movements: Extraocular movements intact.     Conjunctiva/sclera: Conjunctivae normal.     Pupils: Pupils are equal, round, and reactive to light.  Cardiovascular:     Rate and Rhythm: Normal rate and regular rhythm.     Pulses: Normal pulses.  Pulmonary:     Effort: Pulmonary effort is normal. No respiratory distress.     Comments: Patient has no difficulty speaking in complete sentences. Abdominal:     General: Abdomen is flat. There is no distension.     Palpations: Abdomen is soft.     Tenderness: There is abdominal tenderness in the epigastric area.     Comments: Epigastric tenderness to palpation. No CVA tenderness, guarding, rebound, or distention.  Ileostomy site appears well-kept with no signs of redness, erythema, or infection.  No hernias appreciated on exam.  Musculoskeletal:  General: Normal range of motion.     Cervical back: Normal range of motion.  Skin:    General: Skin is warm and dry.     Capillary Refill: Capillary refill takes less than 2 seconds.  Neurological:     General: No focal deficit present.     Mental Status: She is alert. Mental status is at baseline.  Psychiatric:        Mood and Affect: Mood normal.     (all labs ordered are listed, but only abnormal results are displayed) Labs Reviewed  LIPASE, BLOOD  - Abnormal; Notable for the following components:      Result Value   Lipase 590 (*)    All other components within normal limits  COMPREHENSIVE METABOLIC PANEL WITH GFR - Abnormal; Notable for the following components:   Sodium 131 (*)    Potassium 3.2 (*)    Chloride 95 (*)    CO2 19 (*)    Glucose, Bld 101 (*)    BUN 34 (*)    Creatinine, Ser 2.55 (*)    Alkaline Phosphatase 151 (*)    GFR, Estimated 24 (*)    Anion gap 17 (*)    All other components within normal limits  CBC - Abnormal; Notable for the following components:   Hemoglobin 9.3 (*)    HCT 29.4 (*)    MCV 67.9 (*)    MCH 21.5 (*)    Platelets 481 (*)    All other components within normal limits  URINALYSIS, ROUTINE W REFLEX MICROSCOPIC - Abnormal; Notable for the following components:   Protein, ur TRACE (*)    Bacteria, UA RARE (*)    All other components within normal limits  PREGNANCY, URINE  LACTIC ACID, PLASMA  LACTIC ACID, PLASMA    EKG: None  Radiology: DG Chest Portable 1 View Result Date: 12/03/2024 EXAM: 1 VIEW(S) XRAY OF THE CHEST 12/03/2024 05:44:00 PM COMPARISON: 01/10/2024 CLINICAL HISTORY: pain FINDINGS: LINES, TUBES AND DEVICES: Right IJ Port-A-Cath in place with tip near the superior cavoatrial junction. LUNGS AND PLEURA: Left basilar atelectasis. No pleural effusion. No pneumothorax. HEART AND MEDIASTINUM: No acute abnormality of the cardiac and mediastinal silhouettes. BONES AND SOFT TISSUES: No acute osseous abnormality. IMPRESSION: 1. Left basilar atelectasis. Electronically signed by: Greig Pique MD MD 12/03/2024 05:55 PM EST RP Workstation: [id number]   CT ABDOMEN PELVIS WO CONTRAST Result Date: 12/03/2024 EXAM: CT ABDOMEN AND PELVIS WITHOUT CONTRAST 12/03/2024 05:12:48 PM TECHNIQUE: CT of the abdomen and pelvis was performed without the administration of intravenous contrast. Multiplanar reformatted images are provided for review. Automated exposure control, iterative reconstruction,  and/or weight-based adjustment of the mA/kV was utilized to reduce the radiation dose to as low as reasonably achievable. COMPARISON: 02/22/2024 CLINICAL HISTORY: Abdominal pain, acute, nonlocalized. FINDINGS: LOWER CHEST: Multiple small clustered ground glass airspace opacities in the left lower lobe. Subsegmental atelectasis in the lingula and left lower lobe. LIVER: Focal fatty infiltration along the falciform ligament. GALLBLADDER AND BILE DUCTS: Gallbladder is unremarkable. No biliary ductal dilatation. SPLEEN: No acute abnormality. PANCREAS: No acute abnormality. ADRENAL GLANDS: No acute abnormality. KIDNEYS, URETERS AND BLADDER: Unchanged 2 mm left renal cyst. No stones in the kidneys or ureters. No hydronephrosis. No perinephric or periureteral stranding. Decompressed urinary bladder. Per consensus, no follow-up is needed for simple Bosniak type 1 and 2 renal cysts, unless the patient has a malignancy history or risk factors. GI AND BOWEL: Postsurgical changes consistent with a subtotal colectomy. Right lower quadrant end  ileostomy. Stomach demonstrates no acute abnormality. There is no bowel obstruction. PERITONEUM AND RETROPERITONEUM: No ascites. No free air. VASCULATURE: Aorta is normal in caliber. LYMPH NODES: No lymphadenopathy. REPRODUCTIVE ORGANS: The uterus and ovaries are within normal limits for patient's age. BONES AND SOFT TISSUES: No acute osseous abnormality. No focal soft tissue abnormality. IMPRESSION: 1. No acute findings in the abdomen or pelvis. 2. Multiple small clustered ground glass airspace opacities in the left lower lobe, worrisome for developing bronchopneumonia . Electronically signed by: Rogelia Myers MD MD 12/03/2024 05:24 PM EST RP Workstation: [id number]     Procedures   Medications Ordered in the ED  lactated ringers  bolus 1,000 mL (0 mLs Intravenous Stopped 12/03/24 2004)  LORazepam  (ATIVAN ) injection 0.5 mg (0.5 mg Intravenous Given 12/03/24 1634)  lactated ringers   bolus 1,000 mL (1,000 mLs Intravenous New Bag/Given 12/03/24 2004)  fentaNYL  (SUBLIMAZE ) injection 25 mcg (25 mcg Intravenous Given 12/03/24 1955)                                 Medical Decision Making Amount and/or Complexity of Data Reviewed Labs: ordered. Decision-making details documented in ED Course. Radiology: ordered.  Risk Prescription drug management. Decision regarding hospitalization.   Patient presents to the ED for: Abdominal pain This involves an extensive number of treatment options Differential diagnosis includes: Pancreatitis Gastritis/gastroenteritis Hepatobiliary etiology Other infectious etiology Co-morbid conditions: CKD, several prior abdominal surgeries  Additional history/records obtained and reviewed: Additional history obtained from  outside medical records External records from outside source obtained and reviewed including outside medical records including several recent visits for dehydration   Clinical Course as of 12/03/24 2303  Wed Dec 03, 2024  1733 Temp: 97.8 F (36.6 C) Afebrile, vital stable, patient in no acute distress [ML]  1733 Urinalysis, Routine w reflex microscopic -Urine, Clean Catch(!) Unremarkable [ML]  1734 Comprehensive metabolic panel(!) Sodium 131, potassium 3.2, creatinine elevated from baseline 2.55, Alk phos 151, anion gap [ML]  1734 Lipase, blood(!) Elevated 590 [ML]  1734 CBC(!) Per patient baseline [ML]  1734 Pregnancy, urine Negative [ML]  1734 Patient given Ativan , LR bolus, fentanyl  for symptomatic relief - patient still endorses pain on reevaluation. [ML]  2241 Lactic acid, plasma WNL [ML]  2243 CT ABDOMEN PELVIS WO CONTRAST No acute findings in the abdomen/pelvis [ML]  2244 Spoke with hospitalist who will admit given patient's pain level and laboratory findings -  [ML]    Clinical Course User Index [ML] Willma Duwaine CROME, PA    Data Reviewed / Actions Taken: Labs ordered/reviewed with my independent  interpretation in ED course above. Imaging ordered/reviewed with my independent interpretation in ED course above. I agree with the radiologists interpretation.   Management / Treatments: See ED course above for medications, treatments administered, and clinical rationale.   Reevaluation of the patient after these medicines showed that the patient's pain had minimal improvement.  I have reviewed the patients home medicines and have made adjustments as needed  ED Course / Reassessments: Problem List: Abdominal pain 34 year old female presented for abdominal pain. Initial assessment included history, physical exam, and review of prior medical records. Laboratory evaluation included a elevated lipase of 598, elevated creatinine from patient's baseline, elevated LFTs, mild hypokalemia and hyponatremia.  Imaging including CT abdomen pelvis demonstrated no acute intra-abdominal pathology, however patient was not able to receive contrast for full evaluation.  Based on the patient's pain that is not well controlled with ED management,  continued nausea, patient's chronic metabolic deficiencies, significant GI history, elevated lipase, and overall clinical presentation  - patient warrants further inpatient management and evaluation.  Patient response: little improvement with ED management  Serial reassessments performed: Yes    Consultations:  Hospitalist - Opyd MD Consult recommendations incorporated into plan: will admit based on continued abdominal pain and nausea.   Disposition: Disposition: Admission Rationale for disposition: for further evaluation and care  The disposition plan and rationale were discussed with the patient at the bedside, all questions were addressed, and the patient demonstrated understanding.  This note was produced using Electronics Engineer. While I have reviewed and verified all clinical information, transcription errors may remain.      Final diagnoses:   None    ED Discharge Orders     None          Willma Duwaine CROME, [place] 12/03/24 2305    Dreama Longs, MD 12/04/24 1221  "

## 2024-12-04 ENCOUNTER — Encounter (HOSPITAL_COMMUNITY): Payer: Self-pay | Admitting: Family Medicine

## 2024-12-04 DIAGNOSIS — Z888 Allergy status to other drugs, medicaments and biological substances status: Secondary | ICD-10-CM | POA: Diagnosis not present

## 2024-12-04 DIAGNOSIS — N1831 Chronic kidney disease, stage 3a: Secondary | ICD-10-CM | POA: Diagnosis present

## 2024-12-04 DIAGNOSIS — Z885 Allergy status to narcotic agent status: Secondary | ICD-10-CM | POA: Diagnosis not present

## 2024-12-04 DIAGNOSIS — Z9049 Acquired absence of other specified parts of digestive tract: Secondary | ICD-10-CM | POA: Diagnosis not present

## 2024-12-04 DIAGNOSIS — J189 Pneumonia, unspecified organism: Secondary | ICD-10-CM | POA: Diagnosis not present

## 2024-12-04 DIAGNOSIS — Z23 Encounter for immunization: Secondary | ICD-10-CM | POA: Diagnosis not present

## 2024-12-04 DIAGNOSIS — E876 Hypokalemia: Secondary | ICD-10-CM | POA: Diagnosis present

## 2024-12-04 DIAGNOSIS — K859 Acute pancreatitis without necrosis or infection, unspecified: Secondary | ICD-10-CM | POA: Diagnosis present

## 2024-12-04 DIAGNOSIS — Z820 Family history of epilepsy and other diseases of the nervous system: Secondary | ICD-10-CM | POA: Diagnosis not present

## 2024-12-04 DIAGNOSIS — E871 Hypo-osmolality and hyponatremia: Secondary | ICD-10-CM | POA: Diagnosis present

## 2024-12-04 DIAGNOSIS — N184 Chronic kidney disease, stage 4 (severe): Secondary | ICD-10-CM | POA: Diagnosis present

## 2024-12-04 DIAGNOSIS — E8721 Acute metabolic acidosis: Secondary | ICD-10-CM | POA: Diagnosis present

## 2024-12-04 DIAGNOSIS — Z882 Allergy status to sulfonamides status: Secondary | ICD-10-CM | POA: Diagnosis not present

## 2024-12-04 DIAGNOSIS — D1391 Familial adenomatous polyposis: Secondary | ICD-10-CM | POA: Diagnosis present

## 2024-12-04 DIAGNOSIS — Z91048 Other nonmedicinal substance allergy status: Secondary | ICD-10-CM | POA: Diagnosis not present

## 2024-12-04 DIAGNOSIS — Z932 Ileostomy status: Secondary | ICD-10-CM | POA: Diagnosis not present

## 2024-12-04 DIAGNOSIS — Z85038 Personal history of other malignant neoplasm of large intestine: Secondary | ICD-10-CM | POA: Diagnosis not present

## 2024-12-04 DIAGNOSIS — Z79899 Other long term (current) drug therapy: Secondary | ICD-10-CM | POA: Diagnosis not present

## 2024-12-04 DIAGNOSIS — J180 Bronchopneumonia, unspecified organism: Secondary | ICD-10-CM | POA: Diagnosis present

## 2024-12-04 DIAGNOSIS — N179 Acute kidney failure, unspecified: Secondary | ICD-10-CM | POA: Diagnosis present

## 2024-12-04 DIAGNOSIS — Z881 Allergy status to other antibiotic agents status: Secondary | ICD-10-CM | POA: Diagnosis not present

## 2024-12-04 DIAGNOSIS — D509 Iron deficiency anemia, unspecified: Secondary | ICD-10-CM | POA: Diagnosis present

## 2024-12-04 DIAGNOSIS — Z560 Unemployment, unspecified: Secondary | ICD-10-CM | POA: Diagnosis not present

## 2024-12-04 LAB — CBC
HCT: 24.5 % — ABNORMAL LOW (ref 36.0–46.0)
Hemoglobin: 7.6 g/dL — ABNORMAL LOW (ref 12.0–15.0)
MCH: 21.7 pg — ABNORMAL LOW (ref 26.0–34.0)
MCHC: 31 g/dL (ref 30.0–36.0)
MCV: 70 fL — ABNORMAL LOW (ref 80.0–100.0)
Platelets: 372 K/uL (ref 150–400)
RBC: 3.5 MIL/uL — ABNORMAL LOW (ref 3.87–5.11)
RDW: 15.4 % (ref 11.5–15.5)
WBC: 9.5 K/uL (ref 4.0–10.5)
nRBC: 0 % (ref 0.0–0.2)

## 2024-12-04 LAB — BASIC METABOLIC PANEL WITH GFR
Anion gap: 11 (ref 5–15)
BUN: 21 mg/dL — ABNORMAL HIGH (ref 6–20)
CO2: 20 mmol/L — ABNORMAL LOW (ref 22–32)
Calcium: 8 mg/dL — ABNORMAL LOW (ref 8.9–10.3)
Chloride: 107 mmol/L (ref 98–111)
Creatinine, Ser: 2.14 mg/dL — ABNORMAL HIGH (ref 0.44–1.00)
GFR, Estimated: 30 mL/min — ABNORMAL LOW
Glucose, Bld: 93 mg/dL (ref 70–99)
Potassium: 3.4 mmol/L — ABNORMAL LOW (ref 3.5–5.1)
Sodium: 137 mmol/L (ref 135–145)

## 2024-12-04 LAB — COMPREHENSIVE METABOLIC PANEL WITH GFR
ALT: 7 U/L (ref 0–44)
AST: 15 U/L (ref 15–41)
Albumin: 3.5 g/dL (ref 3.5–5.0)
Alkaline Phosphatase: 119 U/L (ref 38–126)
Anion gap: 11 (ref 5–15)
BUN: 28 mg/dL — ABNORMAL HIGH (ref 6–20)
CO2: 20 mmol/L — ABNORMAL LOW (ref 22–32)
Calcium: 8.6 mg/dL — ABNORMAL LOW (ref 8.9–10.3)
Chloride: 102 mmol/L (ref 98–111)
Creatinine, Ser: 2.24 mg/dL — ABNORMAL HIGH (ref 0.44–1.00)
GFR, Estimated: 28 mL/min — ABNORMAL LOW
Glucose, Bld: 99 mg/dL (ref 70–99)
Potassium: 2.8 mmol/L — ABNORMAL LOW (ref 3.5–5.1)
Sodium: 133 mmol/L — ABNORMAL LOW (ref 135–145)
Total Bilirubin: 0.2 mg/dL (ref 0.0–1.2)
Total Protein: 6.3 g/dL — ABNORMAL LOW (ref 6.5–8.1)

## 2024-12-04 LAB — LIPASE, BLOOD: Lipase: 166 U/L — ABNORMAL HIGH (ref 11–51)

## 2024-12-04 LAB — PROCALCITONIN: Procalcitonin: 0.16 ng/mL

## 2024-12-04 LAB — TRIGLYCERIDES: Triglycerides: 123 mg/dL

## 2024-12-04 MED ORDER — POTASSIUM CHLORIDE CRYS ER 20 MEQ PO TBCR
40.0000 meq | EXTENDED_RELEASE_TABLET | ORAL | Status: AC
  Administered 2024-12-04 (×2): 40 meq via ORAL
  Filled 2024-12-04 (×2): qty 2

## 2024-12-04 MED ORDER — LACTATED RINGERS IV SOLN: INTRAVENOUS | Status: AC

## 2024-12-04 MED ORDER — POTASSIUM CHLORIDE 10 MEQ/100ML IV SOLN
10.0000 meq | INTRAVENOUS | Status: AC
  Administered 2024-12-04 (×3): 10 meq via INTRAVENOUS
  Filled 2024-12-04 (×3): qty 100

## 2024-12-04 MED ORDER — LOPERAMIDE HCL 1 MG/7.5ML PO SUSP
4.0000 mg | Freq: Three times a day (TID) | ORAL | Status: DC
  Administered 2024-12-04 – 2024-12-05 (×6): 4 mg via ORAL
  Filled 2024-12-04 (×7): qty 30

## 2024-12-04 MED ORDER — HEPARIN SODIUM (PORCINE) 5000 UNIT/ML IJ SOLN
5000.0000 [IU] | Freq: Three times a day (TID) | INTRAMUSCULAR | Status: DC
  Filled 2024-12-04: qty 1

## 2024-12-04 MED ORDER — FENTANYL CITRATE (PF) 50 MCG/ML IJ SOSY
25.0000 ug | PREFILLED_SYRINGE | INTRAMUSCULAR | Status: DC | PRN
  Administered 2024-12-04 (×4): 25 ug via INTRAVENOUS
  Filled 2024-12-04 (×4): qty 1

## 2024-12-04 MED ORDER — INFLUENZA VIRUS VACC SPLIT PF (FLUZONE) 0.5 ML IM SUSY
0.5000 mL | PREFILLED_SYRINGE | INTRAMUSCULAR | Status: AC
  Administered 2024-12-05: 0.5 mL via INTRAMUSCULAR
  Filled 2024-12-04: qty 0.5

## 2024-12-04 MED ORDER — LORAZEPAM 2 MG/ML IJ SOLN
0.5000 mg | Freq: Four times a day (QID) | INTRAMUSCULAR | Status: DC | PRN
  Administered 2024-12-04 – 2024-12-05 (×3): 0.5 mg via INTRAVENOUS
  Filled 2024-12-04 (×3): qty 1

## 2024-12-04 MED ORDER — SODIUM CHLORIDE 0.9 % IV SOLN
100.0000 mg | Freq: Two times a day (BID) | INTRAVENOUS | Status: DC
  Administered 2024-12-04: 100 mg via INTRAVENOUS
  Filled 2024-12-04 (×2): qty 100

## 2024-12-04 MED ORDER — PROCHLORPERAZINE EDISYLATE 10 MG/2ML IJ SOLN
5.0000 mg | Freq: Four times a day (QID) | INTRAMUSCULAR | Status: DC | PRN
  Administered 2024-12-04: 5 mg via INTRAVENOUS
  Filled 2024-12-04: qty 2

## 2024-12-04 MED ORDER — DOXYCYCLINE HYCLATE 100 MG PO TABS
100.0000 mg | ORAL_TABLET | Freq: Two times a day (BID) | ORAL | Status: DC
  Administered 2024-12-04 – 2024-12-05 (×3): 100 mg via ORAL
  Filled 2024-12-04 (×3): qty 1

## 2024-12-04 MED ORDER — SODIUM CHLORIDE 0.9 % IV SOLN
1.0000 g | INTRAVENOUS | Status: DC
  Administered 2024-12-04 – 2024-12-05 (×2): 1 g via INTRAVENOUS
  Filled 2024-12-04 (×2): qty 10

## 2024-12-04 NOTE — Progress Notes (Addendum)
 " PROGRESS NOTE  DAILY DOE  DOB: 1991/06/27  PCP: Cindy Faden, MD FMW:[id number]  DOA: 12/03/2024  LOS: 0 days  Hospital Day: 2  Subjective: Patient was seen and examined this morning. Lying on bed.  Not in distress.  Tolerating liquid diet and wants to advance to soft diet. No family at bedside. Overnight, blood pressure in low normal range Labs this morning with sodium 133, potassium 2.8, BUN/creatinine 28/2.24  Brief narrative: Cindy [name] is a 34 y.o. 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 Denies using alcohol.  In the ED, patient is afebrile, hemodynamically stable with blood pressure in the low normal range, breathing on room air Initial labs with WC count 9.5, hemoglobin 9.3, sodium 131, potassium 3.2, BUN/creatinine 30/2.55, lipase 590, AST, ALT, bilirubin normal alk phos mildly elevated 151, lactic acid normal  CT abdomen pelvis did not show any acute finding in the abdomen pelvis but suggested multiple small clustered ground glass opacities in the left lower lobe worrisome for developing bronchopneumonia. Patient was started on IV fluid Admitted to [facility]  Assessment and plan: Acute pancreatitis Presented with severe epigastric abdominal pain with multiple episodes of nausea and vomiting. Lipase level was elevated. Symptoms concerning for acute pancreatitis however CT scan did not suggest pancreatitis. Given the severity of symptoms, currently on conservative management with IV fluid, IV analgesics, emetics Tolerating liquid diet and wants to advance to soft diet. Repeat labs this morning showed an improvement to 166.  triglyceride level not  elevated. Recent Labs  Lab 12/03/24 1600 12/04/24 0830  LIPASE 590* 166*   Possible bronchopneumonia CT scan suggested multiple small clustered ground glass opacities in the left lower lobe worrisome for bronchopneumonia Procalcitonin level mildly elevated  Patient denies any respite symptoms however. On admission, patient was started on IV Rocephin  and doxycycline .  AKI on CKD 3 Severe hypokalemia Hyponatremia Baseline creatinine around 2.  Presented with creatinine elevated 2.5.  Improving with IV fluid Potassium level low as well. Replacement given.  Repeat this afternoon. Mild low sodium level.  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  Continue hydration. Patient [facility] up with GI at [facility].  Chronic microcytic anemia  chronic iron  deficiency anemia Baseline hemoglobin seems to be close to 8. Noted drop in last 24 hours likely because of hydration.  No active bleeding.  Continue to monitor Recent Labs    02/23/24 0545 05/10/24 0836 09/10/24 1013 12/03/24 1600 12/04/24 0127  HGB 8.8* 9.1* 7.8* 9.3* 7.6*  MCV 87.4 76.5* 74.4* 67.9* 70.0*    Nutrition Status:         Mobility: Independent able to ambulate.  Encourage ambulation  PT Orders:   PT Follow up Rec:     Goals of care   Code Status: Full Code     DVT prophylaxis:     Antimicrobials: IV Rocephin , doxycycline  Fluid: LR at 125 mL/h Consultants: None Family Communication: None  at bedside  Status: Observation Level of care:  Telemetry   Patient is from: Home Needs to continue in-hospital care: Management of acute pancreatitis Anticipated d/c to: Hopefully home in 1 to 2 days      Diet:  Diet Order              DIET DYS 3 Room service appropriate? Yes; Fluid consistency: Thin  Diet effective now                   Scheduled Meds:  Chlorhexidine  Gluconate Cloth  6 each Topical Daily   [START ON 12/05/2024] influenza vac split trivalent PF  0.5 mL Intramuscular Tomorrow-1000   loperamide  HCl  4 mg Oral TID PC & HS   potassium chloride   40 mEq Oral Q2H   sodium chloride  flush  10-40 mL Intracatheter Q12H    PRN meds: fentaNYL  (SUBLIMAZE ) injection, LORazepam , prochlorperazine    Infusions:   cefTRIAXone  (ROCEPHIN )  IV 1 g (12/04/24 0421)   doxycycline  (VIBRAMYCIN ) IV 100 mg (12/04/24 0124)   lactated ringers  125 mL/hr at 12/04/24 0923    Antimicrobials: Anti-infectives (From admission, onward)    Start     Dose/Rate Route Frequency Ordered Stop   12/04/24 0430  cefTRIAXone  (ROCEPHIN ) 1 g in sodium chloride  0.9 % 100 mL IVPB        1 g 200 mL/hr over 30 Minutes Intravenous Every 24 hours 12/04/24 0343     12/04/24 0145  doxycycline  (VIBRAMYCIN ) 100 mg in sodium chloride  0.9 % 250 mL IVPB        100 mg 125 mL/hr over 120 Minutes Intravenous Every 12 hours 12/04/24 0051         Objective: Vitals:   12/04/24 0450 12/04/24 0907  BP: 101/64 99/67  Pulse: 98 92  Resp: 17   Temp: 98.1 F (36.7 C) 97.8 F (36.6 C)  SpO2: 100% 100%    Intake/Output Summary (Last 24 hours) at 12/04/2024 1215 Last data filed at 12/04/2024 9388 Gross per 24 hour  Intake 1414.95 ml  Output --  Net 1414.95 ml   There were no vitals filed for this visit. Weight change:  There is no height or weight on file to calculate BMI.   Physical Exam: General exam: Pleasant, young African-American female Skin: No rashes, lesions or ulcers. HEENT: Atraumatic, normocephalic, no obvious bleeding Lungs: Clear to auscultation bilaterally,  CVS: S1, S2, no murmur,   GI/Abd: Soft, mild to moderate epigastric tenderness, nondistended, bowel sound present, ileostomy bag with liquid stool. CNS: Alert, awake, oriented  x 3 Psychiatry: Mood appropriate Extremities: No pedal edema, no calf tenderness,   Data Review: I have personally reviewed the laboratory data and studies available.  F/u labs ordered Unresulted Labs (From admission, onward)     Start     Ordered   12/05/24 0500  Lipase, blood  Tomorrow morning,   R       Question:  Specimen collection method  Answer:  IV Team=IV Team collect   12/04/24 0757   12/05/24 0500  CBC with Differential/Platelet  Tomorrow morning,   R       Question:  Specimen collection method  Answer:  IV Team=IV Team collect   12/04/24 0757   12/05/24 0500  Comprehensive metabolic panel with GFR  Tomorrow morning,   R       Question:  Specimen collection method  Answer:  IV Team=IV Team collect   12/04/24 0757   12/04/24 1400  Basic metabolic panel  with GFR  Once-Timed,   TIMED       Question:  Specimen collection method  Answer:  IV Team=IV Team collect   12/04/24 0757            Signed, Chapman Rota, MD [facility] 12/04/2024  "

## 2024-12-04 NOTE — Consult Note (Signed)
 Referring Provider: Dr. Franky Primary Care Physician:  Karolee Faden, MD Primary Gastroenterologist:  Sampson  Reason for Consultation:  Pancreatitis  HPI: Cindy [name] is a 34 y.o. 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 Lipase 590, TB 0.2, ALP 151, AST 16, ALT 9. Noncontrast CT negative for pancreatic inflammation. Denies previous episodes of pancreatitis. Denies alcohol. Reports that she feels a lot better this morning without abdominal pain, nausea or vomiting. Hungry.  Past Medical History:  Diagnosis Date   Adenocarcinoma of colon (HCC) 05/26/2013   Bilateral flank pain    due to ureteral stents   Chronic hypokalemia    CKD (chronic kidney disease), stage II    Colon cancer (HCC) DX  2014---  ONCOLOGIST AT [facility]   DX RIGHT ACSECDING CARCINOMA IN BACKGROUND FAP AND SEVERE MALNUTRITION-- Stage 2A  (pT3, N0, M0)  s/p subtotal colecotmy and completed protectomy and ileostomy w/ revision   Complication of anesthesia    post op aspiration pneumonia w/ lap. appy 12-23-2007 (emergent ruptured appendix)   FAP (familial adenomatous polyposis) 09/30/2014   Heart murmur    History of acute renal failure    2015;  2016   History of fetal demise, not currently pregnant    03-22-2013  stillborn at 24 wks   History of kidney stones    History of sepsis    admitted 12-03-2016 (post-op day 2 w/ bilateral ureteral stents and stone manipluation) discharged 12-06-2016  for urosepsis   Hypomagnesemia    Ileostomy in place [facility])    Iron  deficiency anemia    Nephrolithiasis    BILATERAL    Normocytic anemia    Renal cyst, left    PER CT 11-12-2016   Urgency of urination     Past Surgical History:  Procedure Laterality Date   ABDOMINAL SURGERY     COLONOSCOPY N/A 05/24/2013   Procedure:  COLONOSCOPY;  Surgeon: Norleen JAYSON Hint, MD;  Location: [facility] ENDOSCOPY;  Service: Endoscopy;  Laterality: N/A;   COMPLETION PROTECTOMY AND REVISION ILEOSTOMY/ LYSIS ADHESIONS  07-28-2014    [facility]   CYSTOSCOPY W/ RETROGRADES Right 12/15/2016   Procedure: CYSTOSCOPY WITH RETROGRADE PYELOGRAM;  Surgeon: Ricardo Likens, MD;  Location: [facility];  Service: Urology;  Laterality: Right;   CYSTOSCOPY W/ URETERAL STENT PLACEMENT Bilateral 11/01/2016   Procedure: CYSTOSCOPY WITH BILATERAL RETROGRADE PYELOGRAM BILATERAL URETERAL STENT PLACEMENT;  Surgeon: Ricardo Likens, MD;  Location: [facility];  Service: Urology;  Laterality: Bilateral;   CYSTOSCOPY W/ URETERAL STENT PLACEMENT Right 12/15/2016   Procedure: CYSTOSCOPY WITH STENT REPLACEMENT;  Surgeon: Ricardo Likens, MD;  Location: [facility];  Service: Urology;  Laterality: Right;   CYSTOSCOPY W/ URETERAL STENT REMOVAL Bilateral 12/15/2016   Procedure: CYSTOSCOPY WITH STENT REMOVAL;  Surgeon: Ricardo Likens, MD;  Location[facility];  Service: Urology;  Laterality: Bilateral;   CYSTOSCOPY WITH URETEROSCOPY Bilateral 12/01/2016   Procedure: FIRST STAGE CYSTOSCOPY WITH URETEROSCOPY,  STONE MANIPULATION and BASKETRY, STENT EXCHANGE;  Surgeon: Ricardo Likens, MD;  Location: [facility];  Service: Urology;  Laterality: Bilateral;   CYSTOSCOPY WITH URETEROSCOPY Right 12/15/2016   Procedure: SECOND STAGE -CYSTOSCOPY WITH URETEROSCOPY AND STONE EXTRACTION WITH BASKET;  Surgeon: Ricardo Likens, MD;  Location: [facility];  Service: Urology;  Laterality: Right;   HOLMIUM LASER APPLICATION Bilateral 12/01/2016  Procedure: HOLMIUM LASER APPLICATION;  Surgeon: Ricardo Likens, MD;  Location: [facility];  Service: Urology;  Laterality: Bilateral;   HOLMIUM LASER APPLICATION Right 12/15/2016   Procedure: HOLMIUM LASER APPLICATION;  Surgeon: Ricardo Likens, MD;  Location: [facility];  Service: Urology;  Laterality: Right;   I & D EXTREMITY Left 11/14/2013   Procedure: IRRIGATION AND DEBRIDEMENT LEFT LONG FINGER;  Surgeon: Franky JONELLE Curia, MD;  Location: [facility];  Service: Orthopedics;  Laterality: Left;   IM NAILING TIBIA Left 08/23/2010   LAPAROSCOPIC APPENDECTOMY  12/23/2007   LEG RECONSTRUCTION USING FASCIAL FLAP  ~ 2009   SUBTOTAL COLECTOMY /  RESECTION ENBLOC DISTAL ILEUM/  ILEOSTOMY  07/ 2014  [facility]   TRANSTHORACIC ECHOCARDIOGRAM  11/11/2011   ef 55-60%/  mild MR/  trivial PR and TR/  PASP 29-79mmHg/  trivial pericardial effusion identified    Prior to Admission medications  Medication Sig Start Date End Date Taking? Authorizing Provider  famotidine  (PEPCID ) 40 MG/5ML suspension Take 40 mg by mouth daily as needed for heartburn or indigestion.    [provider]  IMODIUM  A-D 1 MG/7.5ML solution Take 30 mLs by mouth in the morning, at noon, in the evening, and at bedtime.    [provider]  potassium chloride  20 MEQ/15ML (10%) SOLN Take 30.6667 mEq by mouth. 04/11/24   [provider]  potassium chloride  SA (KLOR-CON  M) 20 MEQ tablet Take 1 tablet (20 mEq total) by mouth 3 (three) times daily. 02/23/24 03/24/24  Laurence Locus, DO  sodium bicarbonate  325 MG tablet Take 325 mg by mouth daily.    [provider]  sodium bicarbonate  650 MG tablet Take 650 mg by mouth. 04/11/24   [provider]    Scheduled Meds:  Chlorhexidine  Gluconate Cloth  6 each Topical Daily   heparin   5,000 Units Subcutaneous Q8H   [START ON 12/05/2024] influenza vac split trivalent PF  0.5 mL Intramuscular Tomorrow-1000   loperamide  HCl  4 mg Oral TID PC & HS   potassium chloride   40 mEq Oral Q2H   sodium chloride  flush  10-40 mL Intracatheter Q12H   Continuous Infusions:  cefTRIAXone  (ROCEPHIN )  IV 1 g (12/04/24 0421)   doxycycline  (VIBRAMYCIN ) IV 100 mg (12/04/24 0124)   lactated ringers  125 mL/hr at 12/04/24 0923    PRN Meds:.fentaNYL  (SUBLIMAZE ) injection, LORazepam , prochlorperazine   Allergies as of 12/03/2024 - Review Complete 12/03/2024  Allergen Reaction Noted   Citrus Hives 07/10/2014   Food Anaphylaxis and Other (See Comments) 09/29/2016   Iron  Palpitations and Other (See Comments) 07/28/2016   Iron  sucrose Anaphylaxis 02/24/2021   Orange oil Anaphylaxis, Hives, and Swelling 07/16/2014   Vancomycin  Anaphylaxis 08/21/2020   Venofer [ferric oxide] Anaphylaxis 07/03/2016   Soap Hives, Itching, and Other (See Comments) 01/14/2015   Sulfamethoxazole -trimethoprim  Nausea And Vomiting and Other (See Comments) 11/09/2016   Levaquin  [levofloxacin ] Hives 01/14/2015   Morphine  Hives, Swelling, and Other (See Comments) 11/24/2016   Sulfa  antibiotics Other (See Comments) 09/02/2017    Family History  Adopted: Yes  Problem Relation Age of Onset   ALS Mother    Healthy Father    Alcohol abuse Neg Hx    Arthritis Neg Hx    Asthma Neg Hx    Birth defects Neg Hx    Cancer Neg Hx    COPD Neg Hx    Depression Neg Hx    Diabetes Neg Hx    Drug abuse Neg Hx  Early death Neg Hx    Hearing loss Neg Hx    Heart disease Neg Hx    Hyperlipidemia Neg Hx    Hypertension Neg Hx    Kidney disease Neg Hx    Learning disabilities Neg Hx    Mental illness Neg Hx    Mental retardation Neg Hx    Miscarriages / Stillbirths Neg Hx    Stroke Neg Hx    Vision loss Neg Hx     Social History   Socioeconomic History   Marital status: Single    Spouse name: Not on file   Number of children: Not on file   Years of education: Not on file   Highest education level: Not on file  Occupational History   Occupation: Unemployed  Tobacco Use   Smoking status: Never   Smokeless tobacco: Never  Vaping Use   Vaping status: Never Used  Substance and Sexual Activity   Alcohol use: No   Drug use: No   Sexual activity: Not on file  Other Topics Concern   Not on file  Social History Narrative   ** Merged  History Encounter **       Lives with her father.  Unemployed.  Previously worked at [employer].   Social Drivers of Health   Tobacco Use: Low Risk (12/04/2024)   Patient History    Smoking Tobacco Use: Never    Smokeless Tobacco Use: Never    Passive Exposure: Not on file  Financial Resource Strain: Not on file  Food Insecurity: No Food Insecurity (12/03/2024)   Epic    Worried About Programme Researcher, Broadcasting/film/video in the Last Year: Never true    Ran Out of Food in the Last Year: Never true  Transportation Needs: No Transportation Needs (12/03/2024)   Epic    Lack of Transportation (Medical): No    Lack of Transportation (Non-Medical): No  Physical Activity: Not on file  Stress: Not on file  Social Connections: Socially Integrated (02/22/2024)   Social Connection and Isolation Panel    Frequency of Communication with Friends and Family: More than three times a week    Frequency of Social Gatherings with Friends and Family: More than three times a week    Attends Religious Services: 1 to 4 times per year    Active Member of [employer] or Organizations: Yes    Attends Banker Meetings: More than 4 times per year    Marital Status: Living with partner  Intimate Partner Violence: Not At Risk (12/03/2024)   Epic    Fear of Current or Ex-Partner: No    Emotionally Abused: No    Physically Abused: No    Sexually Abused: No  Depression (PHQ2-9): Not on file  Alcohol Screen: Not on file  Housing: Low Risk (12/03/2024)   Epic    Unable to Pay for Housing in the Last Year: No    Number of Times Moved in the Last Year: 0    Homeless in the Last Year: No  Utilities: Not At Risk (12/03/2024)   Epic    Threatened with loss of utilities: No  Health Literacy: Not on file    Review of Systems: All negative except as stated above in HPI.  Physical Exam: Vital signs: Vitals:   12/04/24 0450 12/04/24 0907  BP: 101/64 99/67  Pulse: 98 92  Resp: 17   Temp: 98.1 F (36.7 C) 97.8 F (36.6 C)   SpO2: 100% 100%     General:  Lethargic, thin, multiple tattoos, pleasant, no acute distress  Head: normocephalic, atraumatic Eyes: anicteric sclera ENT: oropharynx clear Neck: supple, nontender Lungs:  Clear throughout to auscultation.   No wheezes, crackles, or rhonchi. No acute distress. Heart:  Regular rate and rhythm; no murmurs, clicks, rubs,  or gallops. Abdomen: soft, nontender, nondistended, +BS  Rectal:  Deferred Ext: no edema  GI:  Lab Results: Recent Labs    12/03/24 1600 12/04/24 0127  WBC 9.5 9.5  HGB 9.3* 7.6*  HCT 29.4* 24.5*  PLT 481* 372   BMET Recent Labs    12/03/24 1600 12/04/24 0127  NA 131* 133*  K 3.2* 2.8*  CL 95* 102  CO2 19* 20*  GLUCOSE 101* 99  BUN 34* 28*  CREATININE 2.55* 2.24*  CALCIUM  9.3 8.6*   LFT Recent Labs    12/04/24 0127  PROT 6.3*  ALBUMIN  3.5  AST 15  ALT 7  ALKPHOS 119  BILITOT 0.2   PT/INR No results for input(s): LABPROT, INR in the last 72 hours.   Impression/Plan: Acute pancreatitis - clinically improving. Full liquids this morning and if tolerates then change to soft diet for dinner. History of FAP and colon cancer - s/p subtotal colectomy with end ileostomy. Home today or tomorrow from GI standpoint. F/U with [facility] who she has seen in the past. Will sign off. Call if questions.    LOS: 0 days   Jerrell JAYSON Sol  12/04/2024, 10:13 AM  Questions please call [phone]

## 2024-12-04 NOTE — H&P (Signed)
 " History and Physical    Cindy [name] FMW:[id number] DOB: [birth year] DOA: 12/03/2024  Patient coming from: Home.  Chief Complaint: Abdominal pain with nausea vomiting.  HPI: Cindy [name] is a 34 y.o. female with history of familial adenomatous polyposis related colon cancer status post subtotal colectomy end ileostomy with history of short-bowel syndrome with chronic kidney disease stage IIIa baseline creatinine around 2 with hypokalemia has been experiencing severe abdominal pain mostly in the epigastric area with multiple episodes of nausea vomiting over the last 24 hours.  Denies drinking alcohol.  ED Course: In the ER patient's labs show elevated lipase of 590 creatinine of 2.5 bicarb of 19 potassium of 3.2 sodium 131 hemoglobin 9.3.  CT abdomen pelvis was showing thing acute intra-abdominal.  There were features concerning for bronchopneumonia.  Patient was started on fluids pain relief medications and admitted for further management of possible acute pancreatitis.  Review of Systems: As per HPI, rest all negative.   Past Medical History:  Diagnosis Date   Adenocarcinoma of colon (HCC) 05/26/2013   Bilateral flank pain    due to ureteral stents   Chronic hypokalemia    CKD (chronic kidney disease), stage II    Colon cancer (HCC) DX  2014---  ONCOLOGIST AT [facility]   DX RIGHT ACSECDING CARCINOMA IN BACKGROUND FAP AND SEVERE MALNUTRITION-- Stage 2A  (pT3, N0, M0)  s/p subtotal colecotmy and completed protectomy and ileostomy w/ revision   Complication of anesthesia    post op aspiration pneumonia w/ lap. appy 12-23-2007 (emergent ruptured appendix)   FAP (familial adenomatous polyposis) 09/30/2014   Heart murmur    History of acute renal failure    2015;  2016   History of fetal demise, not currently pregnant    03-22-2013  stillborn at 24 wks   History of kidney stones    History of sepsis    admitted 12-03-2016 (post-op day 2 w/ bilateral ureteral stents and stone  manipluation) discharged 12-06-2016  for urosepsis   Hypomagnesemia    Ileostomy in place [facility])    Iron  deficiency anemia    Nephrolithiasis    BILATERAL    Normocytic anemia    Renal cyst, left    PER CT 11-12-2016   Urgency of urination     Past Surgical History:  Procedure Laterality Date   ABDOMINAL SURGERY     COLONOSCOPY N/A 05/24/2013   Procedure: COLONOSCOPY;  Surgeon: Norleen JAYSON Hint, MD;  Location: [facility] ENDOSCOPY;  Service: Endoscopy;  Laterality: N/A;   COMPLETION PROTECTOMY AND REVISION ILEOSTOMY/ LYSIS ADHESIONS  07-28-2014    [facility]   CYSTOSCOPY W/ RETROGRADES Right 12/15/2016   Procedure: CYSTOSCOPY WITH RETROGRADE PYELOGRAM;  Surgeon: Ricardo Likens, MD;  Location: [facility];  Service: Urology;  Laterality: Right;   CYSTOSCOPY W/ URETERAL STENT PLACEMENT Bilateral 11/01/2016   Procedure: CYSTOSCOPY WITH BILATERAL RETROGRADE PYELOGRAM BILATERAL URETERAL STENT PLACEMENT;  Surgeon: Ricardo Likens, MD;  Location: [facility];  Service: Urology;  Laterality: Bilateral;   CYSTOSCOPY W/ URETERAL STENT PLACEMENT Right 12/15/2016   Procedure: CYSTOSCOPY WITH STENT REPLACEMENT;  Surgeon: Ricardo Likens, MD;  Location: [facility];  Service: Urology;  Laterality: Right;   CYSTOSCOPY W/ URETERAL STENT REMOVAL Bilateral 12/15/2016   Procedure: CYSTOSCOPY WITH STENT REMOVAL;  Surgeon: Ricardo Likens, MD;  Location: [facility];  Service: Urology;  Laterality: Bilateral;   CYSTOSCOPY WITH URETEROSCOPY Bilateral 12/01/2016   Procedure: FIRST STAGE CYSTOSCOPY WITH URETEROSCOPY,  STONE MANIPULATION and BASKETRY, STENT  EXCHANGE;  Surgeon: Ricardo Likens, MD;  Location: [facility];  Service: Urology;  Laterality: Bilateral;   CYSTOSCOPY WITH URETEROSCOPY Right 12/15/2016   Procedure: SECOND STAGE -CYSTOSCOPY WITH URETEROSCOPY AND STONE EXTRACTION WITH BASKET;  Surgeon: Ricardo Likens, MD;  Location: [facility];  Service:  Urology;  Laterality: Right;   HOLMIUM LASER APPLICATION Bilateral 12/01/2016   Procedure: HOLMIUM LASER APPLICATION;  Surgeon: Ricardo Likens, MD;  Location: [facility];  Service: Urology;  Laterality: Bilateral;   HOLMIUM LASER APPLICATION Right 12/15/2016   Procedure: HOLMIUM LASER APPLICATION;  Surgeon: Ricardo Likens, MD;  Location: [facility];  Service: Urology;  Laterality: Right;   I & D EXTREMITY Left 11/14/2013   Procedure: IRRIGATION AND DEBRIDEMENT LEFT LONG FINGER;  Surgeon: Franky JONELLE Curia, MD;  Location: [facility];  Service: Orthopedics;  Laterality: Left;   IM NAILING TIBIA Left 08/23/2010   LAPAROSCOPIC APPENDECTOMY  12/23/2007   LEG RECONSTRUCTION USING FASCIAL FLAP  ~ 2009   SUBTOTAL COLECTOMY /  RESECTION ENBLOC DISTAL ILEUM/  ILEOSTOMY  07/ 2014  [facility]   TRANSTHORACIC ECHOCARDIOGRAM  11/11/2011   ef 55-60%/  mild MR/  trivial PR and TR/  PASP 29-47mmHg/  trivial pericardial effusion identified     reports that she has never smoked. She has never used smokeless tobacco. She reports that she does not drink alcohol and does not use drugs.  Allergies[1]  Family History  Adopted: Yes  Problem Relation Age of Onset   ALS Mother    Healthy Father    Alcohol abuse Neg Hx    Arthritis Neg Hx    Asthma Neg Hx    Birth defects Neg Hx    Cancer Neg Hx    COPD Neg Hx    Depression Neg Hx    Diabetes Neg Hx    Drug abuse Neg Hx    Early death Neg Hx    Hearing loss Neg Hx    Heart disease Neg Hx    Hyperlipidemia Neg Hx    Hypertension Neg Hx    Kidney disease Neg Hx    Learning disabilities Neg Hx    Mental illness Neg Hx    Mental retardation Neg Hx    Miscarriages / Stillbirths Neg Hx    Stroke Neg Hx    Vision loss Neg Hx     Prior to Admission medications  Medication Sig Start Date End Date Taking? Authorizing Provider  famotidine  (PEPCID ) 40 MG/5ML suspension Take 40 mg by mouth daily as needed for heartburn or  indigestion.    [provider]  IMODIUM  A-D 1 MG/7.5ML solution Take 30 mLs by mouth in the morning, at noon, in the evening, and at bedtime.    [provider]  potassium chloride  20 MEQ/15ML (10%) SOLN Take 30.6667 mEq by mouth. 04/11/24   [provider]  potassium chloride  SA (KLOR-CON  M) 20 MEQ tablet Take 1 tablet (20 mEq total) by mouth 3 (three) times daily. 02/23/24 03/24/24  Laurence Locus, DO  sodium bicarbonate  325 MG tablet Take 325 mg by mouth daily.    [provider]  sodium bicarbonate  650 MG tablet Take 650 mg by mouth. 04/11/24   [provider]    Physical Exam: Constitutional: Moderately built and nourished. Vitals:   12/03/24 1830 12/03/24 1922 12/03/24 2007 12/03/24 2346  BP: (!) 88/56 110/70  109/67  Pulse: 73 (!) 101  77  Resp: 15   17  Temp:   98.5 F (36.9 C) 97.9 F (36.6 C)  TempSrc:   Oral Oral  SpO2: 99% 100%  100%   Eyes: Anicteric no pallor. ENMT: No discharge from the ears eyes nose or mouth. Neck: No mass felt.  No neck rigidity. Respiratory: No rhonchi or crepitations. Cardiovascular: S1-S2 heard. Abdomen: Soft nontender bowel sound present. Musculoskeletal: No edema. Skin: No rash. Neurologic: Alert awake oriented time place and person.  Moves all extremities. Psychiatric: Appears normal.  Normal affect.   Labs on Admission: I have personally reviewed following labs and imaging studies  CBC: Recent Labs  Lab 12/03/24 1600  WBC 9.5  HGB 9.3*  HCT 29.4*  MCV 67.9*  PLT 481*   Basic Metabolic Panel: Recent Labs  Lab 12/03/24 1600  NA 131*  K 3.2*  CL 95*  CO2 19*  GLUCOSE 101*  BUN 34*  CREATININE 2.55*  CALCIUM  9.3   GFR: CrCl cannot be calculated (Unknown ideal weight.). Liver Function Tests: Recent Labs  Lab 12/03/24 1600  AST 16  ALT 9  ALKPHOS 151*  BILITOT 0.2  PROT 8.1  ALBUMIN  4.4   Recent Labs  Lab 12/03/24 1600  LIPASE 590*   No results for input(s): AMMONIA  in the last 168 hours. Coagulation Profile: No results for input(s): INR, PROTIME in the last 168 hours. Cardiac Enzymes: No results for input(s): CKTOTAL, CKMB, CKMBINDEX, TROPONINI in the last 168 hours. BNP (last 3 results) No results for input(s): PROBNP in the last 8760 hours. HbA1C: No results for input(s): HGBA1C in the last 72 hours. CBG: No results for input(s): GLUCAP in the last 168 hours. Lipid Profile: No results for input(s): CHOL, HDL, LDLCALC, TRIG, CHOLHDL, LDLDIRECT in the last 72 hours. Thyroid Function Tests: No results for input(s): TSH, T4TOTAL, FREET4, T3FREE, THYROIDAB in the last 72 hours. Anemia Panel: No results for input(s): VITAMINB12, FOLATE, FERRITIN, TIBC, IRON , RETICCTPCT in the last 72 hours. Urine analysis:    Component Value Date/Time   COLORURINE YELLOW 12/03/2024 1206   APPEARANCEUR CLEAR 12/03/2024 1206   LABSPEC 1.020 12/03/2024 1206   PHURINE 6.0 12/03/2024 1206   GLUCOSEU NEGATIVE 12/03/2024 1206   HGBUR NEGATIVE 12/03/2024 1206   BILIRUBINUR NEGATIVE 12/03/2024 1206   KETONESUR NEGATIVE 12/03/2024 1206   PROTEINUR TRACE (A) 12/03/2024 1206   UROBILINOGEN 0.2 07/29/2015 0320   NITRITE NEGATIVE 12/03/2024 1206   LEUKOCYTESUR NEGATIVE 12/03/2024 1206   Sepsis Labs: @LABRCNTIP (procalcitonin:4,lacticidven:4) )No results found for this or any previous visit (from the past 240 hours).   Radiological Exams on Admission: DG Chest Portable 1 View Result Date: 12/03/2024 EXAM: 1 VIEW(S) XRAY OF THE CHEST 12/03/2024 05:44:00 PM COMPARISON: 01/10/2024 CLINICAL HISTORY: pain FINDINGS: LINES, TUBES AND DEVICES: Right IJ Port-A-Cath in place with tip near the superior cavoatrial junction. LUNGS AND PLEURA: Left basilar atelectasis. No pleural effusion. No pneumothorax. HEART AND MEDIASTINUM: No acute abnormality of the cardiac and mediastinal silhouettes. BONES AND SOFT TISSUES: No acute osseous  abnormality. IMPRESSION: 1. Left basilar atelectasis. Electronically signed by: Greig Pique MD MD 12/03/2024 05:55 PM EST RP Workstation: [id number]   CT ABDOMEN PELVIS WO CONTRAST Result Date: 12/03/2024 EXAM: CT ABDOMEN AND PELVIS WITHOUT CONTRAST 12/03/2024 05:12:48 PM TECHNIQUE: CT of the abdomen and pelvis was performed without the administration of intravenous contrast. Multiplanar reformatted images are provided for review. Automated exposure control, iterative reconstruction, and/or weight-based adjustment of the mA/kV was utilized to reduce the radiation dose to as low as reasonably achievable. COMPARISON: 02/22/2024 CLINICAL HISTORY:  Abdominal pain, acute, nonlocalized. FINDINGS: LOWER CHEST: Multiple small clustered ground glass airspace opacities in the left lower lobe. Subsegmental atelectasis in the lingula and left lower lobe. LIVER: Focal fatty infiltration along the falciform ligament. GALLBLADDER AND BILE DUCTS: Gallbladder is unremarkable. No biliary ductal dilatation. SPLEEN: No acute abnormality. PANCREAS: No acute abnormality. ADRENAL GLANDS: No acute abnormality. KIDNEYS, URETERS AND BLADDER: Unchanged 2 mm left renal cyst. No stones in the kidneys or ureters. No hydronephrosis. No perinephric or periureteral stranding. Decompressed urinary bladder. Per consensus, no follow-up is needed for simple Bosniak type 1 and 2 renal cysts, unless the patient has a malignancy history or risk factors. GI AND BOWEL: Postsurgical changes consistent with a subtotal colectomy. Right lower quadrant end ileostomy. Stomach demonstrates no acute abnormality. There is no bowel obstruction. PERITONEUM AND RETROPERITONEUM: No ascites. No free air. VASCULATURE: Aorta is normal in caliber. LYMPH NODES: No lymphadenopathy. REPRODUCTIVE ORGANS: The uterus and ovaries are within normal limits for patient's age. BONES AND SOFT TISSUES: No acute osseous abnormality. No focal soft tissue abnormality. IMPRESSION: 1. No  acute findings in the abdomen or pelvis. 2. Multiple small clustered ground glass airspace opacities in the left lower lobe, worrisome for developing bronchopneumonia . Electronically signed by: Rogelia Myers MD MD 12/03/2024 05:24 PM EST RP Workstation: [id number]     Assessment/Plan Principal Problem:   Acute pancreatitis Active Problems:   Microcytic anemia   CKD (chronic kidney disease) stage 4, GFR 15-29 ml/min (HCC) - baseline Scr 2.4-2.8   FAP (familial adenomatous polyposis)   Normocytic anemia   CAP (community acquired pneumonia)    Abdominal pain concerning for acute pancreatitis -    patient's symptoms of severe abdominal pain with nausea vomiting elevated lipase are concerning for acute pancreatitis.  Will keep patient on clear liquid diet and on IV fluids and pain relief medications.  Advance diet as tolerated.  Cause for pancreatitis not clear.  Denies drinking alcohol.  No signs of any gallstones at this time.  Check triglyceride levels. Possible pneumonia will start patient empiric antibiotics check procalcitonin.  If procalcitonin is negative may discontinue antibiotics. Acute on chronic kidney disease stage III baseline creatinine is usually around 2.  It is around 2.5 today with hypokalemia.  Continue IV fluids replace potassium.  Follow metabolic panel. History of familial adenomatous polyposis related colon cancer status post subtotal colectomy and end ileostomy.  Has a history of short-bowel syndrome.  On chronic Imodium .  Continue hydration. Anemia appears to be chronic follow CBC.  DVT prophylaxis: Heparin . Code Status: Full code. Family Communication: Discussed with patient. Disposition Plan: Medical floor. Consults called: None. Admission status: Observation.         [1]  Allergies Allergen Reactions   Citrus Hives   Food Anaphylaxis and Other (See Comments)    Orange juice   Iron  Palpitations and Other (See Comments)    Heart stops  **pt has  tolerated IV Fe**   Iron  Sucrose Anaphylaxis    She had anaphylaxis to venofier but was given ferrumoxytol in allergy clinic by Dr. Vincente and tolerated well in 2020   Orange Oil Anaphylaxis, Hives and Swelling    Ex: orange juice, oranges but orange dye and lemonade ok   Vancomycin  Anaphylaxis   Venofer [Ferric Oxide] Anaphylaxis   Soap Hives, Itching and Other (See Comments)    Dove soap   Sulfamethoxazole -Trimethoprim  Nausea And Vomiting and Other (See Comments)    Patient developed AKI that appears to have been from bactrim . She  might be able to take again if watched closely and it is absolutely needed - ie there was no anaphylaxis or allergic rash.   Levaquin  [Levofloxacin ] Hives   Morphine  Hives, Swelling and Other (See Comments)    When given IV, swelling around site   Sulfa  Antibiotics Other (See Comments)    Dehydration    "

## 2024-12-04 NOTE — TOC Initial Note (Addendum)
 Transition of Care [facility]) - Initial/Assessment Note    Patient Details  Name: Cindy [name] MRN: [id number] Date of Birth: Apr 01, 1991  [facility] CM/SW Contact:    Alfonse JONELLE Rex, RN Phone Number: 12/04/2024, 1:58 PM  Clinical Narrative: Admitted from home, PCP and insurance on file, family contacts on file.  GI following. INPT CM will follow for dc needs                   Expected Discharge Plan: Home/Self Care     Patient Goals and CMS Choice Patient states their goals for this hospitalization and ongoing recovery are:: return home          Expected Discharge Plan and Services       Living arrangements for the past 2 months: Apartment                                      Prior Living Arrangements/Services Living arrangements for the past 2 months: Apartment   Patient language and need for interpreter reviewed:: Yes Do you feel safe going back to the place where you live?: Yes      Need for Family Participation in Patient Care: Yes (Comment) Care giver support system in place?: Yes (comment)   Criminal Activity/Legal Involvement Pertinent to Current Situation/Hospitalization: No - Comment as needed  Activities of Daily Living   ADL Screening (condition at time of admission) Independently performs ADLs?: Yes (appropriate for developmental age) Is the patient deaf or have difficulty hearing?: No Does the patient have difficulty seeing, even when wearing glasses/contacts?: No Does the patient have difficulty concentrating, remembering, or making decisions?: No  Permission Sought/Granted                  Emotional Assessment       Orientation: : Oriented to Self, Oriented to Place, Oriented to  Time, Oriented to Situation Alcohol / Substance Use: Not Applicable Psych Involvement: No (comment)  Admission diagnosis:  Acute pancreatitis [K85.90] Abdominal pain, unspecified abdominal location [R10.9] Patient Active Problem List    Diagnosis Date Noted   CAP (community acquired pneumonia) 12/04/2024   Acute pancreatitis 12/03/2024   UTI (urinary tract infection) 02/23/2024   Chronic metabolic acidosis 01/10/2024   Chronic hyponatremia - baseline Na 130 07/18/2020   Prolonged QT interval 07/18/2020   Hypomagnesemia 07/18/2020   Hypophosphatemia 07/18/2020   Acute kidney injury superimposed on chronic kidney disease 09/02/2017   CKD (chronic kidney disease) stage 4, GFR 15-29 ml/min (HCC) - baseline Scr 2.4-2.8 12/03/2016   Normocytic anemia 05/02/2015   Hypotension due to hypovolemia    High output ileostomy (HCC) 09/30/2014   FAP (familial adenomatous polyposis) 09/30/2014   Nephrolithiasis 09/30/2014   Microcytic anemia 04/25/2013   Hypokalemia 04/25/2013   PCP:  Karolee Faden, MD Pharmacy:   [id number] - [place], [place] - [address] [place] [address] Phone: [phone] Fax: [phone]     Social Drivers of Health (SDOH) Social History: SDOH Screenings   Food Insecurity: No Food Insecurity (12/03/2024)  Housing: Low Risk (12/03/2024)  Transportation Needs: No Transportation Needs (12/03/2024)  Utilities: Not At Risk (12/03/2024)  Social Connections: Socially Integrated (02/22/2024)  Tobacco Use: Low Risk (12/04/2024)   SDOH Interventions:     Readmission Risk Interventions    12/04/2024    1:57 PM  Readmission Risk Prevention Plan  Transportation Screening Complete  PCP or Specialist Appt within 3-5 Days Complete  HRI or Home Care Consult Complete  Social Work Consult for Recovery Care Planning/Counseling Complete  Palliative Care Screening Not Applicable  Medication Review Ocean[employer]) Complete

## 2024-12-04 NOTE — Progress Notes (Signed)
 Pt has refused heparin  that is ordered. She stated understanding of the use of heparin , as nurse re-educated her again. Sent message to Lavanda Horns, NP of pt refusing medication.

## 2024-12-04 NOTE — Plan of Care (Signed)
  Problem: Health Behavior/Discharge Planning: Goal: Ability to manage health-related needs will improve Outcome: Progressing   Problem: Activity: Goal: Risk for activity intolerance will decrease Outcome: Progressing   Problem: Nutrition: Goal: Adequate nutrition will be maintained Outcome: Progressing   

## 2024-12-05 ENCOUNTER — Other Ambulatory Visit (HOSPITAL_COMMUNITY): Payer: Self-pay

## 2024-12-05 DIAGNOSIS — K859 Acute pancreatitis without necrosis or infection, unspecified: Secondary | ICD-10-CM | POA: Diagnosis not present

## 2024-12-05 LAB — CBC WITH DIFFERENTIAL/PLATELET
Abs Immature Granulocytes: 0.03 K/uL (ref 0.00–0.07)
Basophils Absolute: 0 K/uL (ref 0.0–0.1)
Basophils Relative: 0 %
Eosinophils Absolute: 0.2 K/uL (ref 0.0–0.5)
Eosinophils Relative: 4 %
HCT: 20.9 % — ABNORMAL LOW (ref 36.0–46.0)
Hemoglobin: 6.3 g/dL — CL (ref 12.0–15.0)
Immature Granulocytes: 1 %
Lymphocytes Relative: 23 %
Lymphs Abs: 1.4 K/uL (ref 0.7–4.0)
MCH: 21.6 pg — ABNORMAL LOW (ref 26.0–34.0)
MCHC: 30.1 g/dL (ref 30.0–36.0)
MCV: 71.6 fL — ABNORMAL LOW (ref 80.0–100.0)
Monocytes Absolute: 0.6 K/uL (ref 0.1–1.0)
Monocytes Relative: 10 %
Neutro Abs: 3.9 K/uL (ref 1.7–7.7)
Neutrophils Relative %: 62 %
Platelets: 263 K/uL (ref 150–400)
RBC: 2.92 MIL/uL — ABNORMAL LOW (ref 3.87–5.11)
RDW: 15.6 % — ABNORMAL HIGH (ref 11.5–15.5)
WBC: 6.1 K/uL (ref 4.0–10.5)
nRBC: 0 % (ref 0.0–0.2)

## 2024-12-05 LAB — COMPREHENSIVE METABOLIC PANEL WITH GFR
ALT: 6 U/L (ref 0–44)
AST: 14 U/L — ABNORMAL LOW (ref 15–41)
Albumin: 3.1 g/dL — ABNORMAL LOW (ref 3.5–5.0)
Alkaline Phosphatase: 93 U/L (ref 38–126)
Anion gap: 8 (ref 5–15)
BUN: 17 mg/dL (ref 6–20)
CO2: 20 mmol/L — ABNORMAL LOW (ref 22–32)
Calcium: 8.6 mg/dL — ABNORMAL LOW (ref 8.9–10.3)
Chloride: 109 mmol/L (ref 98–111)
Creatinine, Ser: 1.9 mg/dL — ABNORMAL HIGH (ref 0.44–1.00)
GFR, Estimated: 35 mL/min — ABNORMAL LOW
Glucose, Bld: 100 mg/dL — ABNORMAL HIGH (ref 70–99)
Potassium: 3.8 mmol/L (ref 3.5–5.1)
Sodium: 137 mmol/L (ref 135–145)
Total Bilirubin: <0.2 mg/dL (ref 0.0–1.2)
Total Protein: 5.4 g/dL — ABNORMAL LOW (ref 6.5–8.1)

## 2024-12-05 LAB — HEMOGLOBIN AND HEMATOCRIT, BLOOD
HCT: 21.1 % — ABNORMAL LOW (ref 36.0–46.0)
Hemoglobin: 6.4 g/dL — CL (ref 12.0–15.0)

## 2024-12-05 LAB — LIPASE, BLOOD: Lipase: 98 U/L — ABNORMAL HIGH (ref 11–51)

## 2024-12-05 LAB — PREPARE RBC (CROSSMATCH)

## 2024-12-05 MED ORDER — SODIUM CHLORIDE 0.9% IV SOLUTION: Freq: Once | INTRAVENOUS | Status: AC

## 2024-12-05 MED ORDER — AMOXICILLIN-POT CLAVULANATE 875-125 MG PO TABS
1.0000 | ORAL_TABLET | Freq: Two times a day (BID) | ORAL | 0 refills | Status: AC
  Filled 2024-12-05: qty 6, 3d supply, fill #0

## 2024-12-05 MED ORDER — HEPARIN SOD (PORK) LOCK FLUSH 100 UNIT/ML IV SOLN
500.0000 [IU] | INTRAVENOUS | Status: AC | PRN
  Administered 2024-12-05: 500 [IU]
  Filled 2024-12-05: qty 5

## 2024-12-05 NOTE — Progress Notes (Signed)
 Lab staff called and informed nurse of current H/H values: 6.3/20.9. Messaged Lavanda Horns, NP of this. Orders entered by her.

## 2024-12-05 NOTE — Progress Notes (Signed)
 I Cindy [name]) updated Blood Bank request form certifying information on the tube, slip, and armband match as requested.

## 2024-12-05 NOTE — Progress Notes (Signed)
 Fellow staff nurse informed me that the lab called and shared with her that the type and screen was missing information from when the IV team drew the blood. Will send them a message regarding this.

## 2024-12-05 NOTE — Discharge Summary (Signed)
 "  Physician Discharge Summary  Cindy [name] FMW:[id number] DOB: 1991/11/04 DOA: 12/03/2024  PCP: Karolee Faden, MD  Admit date: 12/03/2024 Discharge date: 12/05/2024  Admitted from: Home Discharge disposition: Home  Recommendations at discharge:  Complete course of antibiotics with oral Augmentin .   Continue to monitor for any obvious blood loss or black stool Follow-up with your GI doctor at [facility] in 1 to 2 weeks.  Subjective: Patient was seen and examined this morning. Lying in bed.  Not in distress pulmonary pressure feels better.  Abdomen pain improving.  Tolerating diet. Denies any blood loss. Afebrile, hemodynamically stable Labs this morning with lipase level better at 98, creatinine improving at 1.9 hemoglobin down to 6.3 confirmed with repeat check.  Brief narrative: Cindy [name] is a 34 y.o. 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 Denies using alcohol.  In the ED, patient is afebrile, hemodynamically stable with blood pressure in the low normal range, breathing on room air Initial labs with WBC count 9.5, hemoglobin 9.3, sodium 131, potassium 3.2, BUN/creatinine 30/2.55, lipase 590, AST, ALT, bilirubin normal alk phos mildly elevated 151, lactic acid normal  CT abdomen pelvis did not show any acute finding in the abdomen pelvis but suggested multiple small clustered ground glass opacities in the left lower lobe worrisome for developing bronchopneumonia. Patient was started on IV fluid Admitted to [facility]  Hospital course Acute pancreatitis Presented with severe epigastric abdominal pain with multiple episodes of nausea and vomiting. Lipase level was  elevated. Symptoms concerning for acute pancreatitis however CT scan did not suggest pancreatitis. Given the severity of symptoms, currently on conservative management with IV fluid, IV analgesics, emetics Abdominal pain has improved. Tolerating soft diet Lipase level improving, trend as below. Recent Labs  Lab 12/03/24 1600 12/04/24 0830 12/05/24 0413  LIPASE 590* 166* 98*   Acute anemia Chronic microcytic anemia  Chronic iron  deficiency anemia Her hemoglobin dropped to 6.4 today. Baseline between 7 and 8. No active bleeding. Denies any black stool in the ostomy recently. Abdominal pain has improved.  Drop in hemoglobin could be hemodilution probably because of aggressive hydration for pancreatitis.  She already has established GI with [facility].  I had a long conversation with patient this morning about it.   States that she had last endoscopy done at [facility] for evaluation of anemia and did not have any stomach ulcers at that time.  She is agreeable to 1 unit of PRBC transfusion today.  She wants to go home this afternoon and follow-up with GI at [facility].   I have communicated this plan with Dr. Dianna who saw her yesterday. Recent Labs    09/10/24 1013 12/03/24 1600 12/04/24 0127 12/05/24 0413 12/05/24 0624  HGB 7.8* 9.3* 7.6* 6.3* 6.4*  MCV 74.4* 67.9* 70.0* 71.6*  --    Possible bronchopneumonia CT scan suggested multiple small clustered ground glass opacities in the left lower lobe worrisome for bronchopneumonia Procalcitonin level mildly elevated  Patient denies any respiratory symptoms however. On admission, patient was started on IV Rocephin  and doxycycline . Will discharge on 3 more days of oral Augmentin .  AKI on CKD 3 Severe hypokalemia Hyponatremia Baseline creatinine around 2.  Presented with creatinine elevated 2.5.  Improved to baseline with IV fluid.  Potassium level improved with replacement. 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  Continue hydration. Patient follows up with GI at [facility].  Goals of care   Code Status: Full Code   Diet:  Diet Order             Diet general           DIET DYS 3 Room service appropriate? Yes; Fluid consistency: Thin  Diet effective now                   Nutritional status:  There is no height or weight on file to calculate BMI.       Wounds:  -    Discharge Medications:   Allergies as of 12/05/2024       Reactions   Firvanq  [vancomycin ] Anaphylaxis   Iron  Palpitations, Other (See Comments)   Heart stopped Tolerated Feraheme  (ferumoxytol ) at allergy clinic during 2020 and tolerated well   Orange (diagnostic) Anaphylaxis, Hives   Including orange juice, oil   Venofer [iron  Sucrose] Anaphylaxis   Tolerated Feraheme  (ferumoxytol ) at allergy clinic during 2020 and tolerated well   Bactrim  [sulfamethoxazole -trimethoprim ] Nausea And Vomiting, Other (See Comments)   Patient developed AKI that appears to have been from Bactrim . She might be able to take again if watched closely and it is absolutely needed   Soap Hives, Itching, Other (See Comments)   Dove soap   Levaquin  [levofloxacin ] Hives   Ms Contin  [morphine ] Hives, Swelling, Other (See Comments)   Swelling around IV site - reaction occurred with IV administration, unknown about tablet formulations   Sulfa  Antibiotics Other (See Comments)   Dehydration        Medication List     STOP taking these medications    POTASSIUM PO       TAKE these medications    amoxicillin -clavulanate 875-125 MG tablet Commonly known as: AUGMENTIN  Take 1 tablet by mouth 2 (two) times daily for  3 days.   Imodium  A-D 1 MG/7.5ML solution Generic drug: loperamide  HCl Take 30 mLs by mouth in the morning, at noon, in the evening, and at bedtime.   SODIUM BICARBONATE  PO Take 1 tablet by mouth daily.         Follow ups:    Follow-up Information     Karolee Faden, MD Follow up.   Specialty: Internal Medicine Contact information: [facility] [place] [address] [phone]                 Discharge Instructions:   Discharge Instructions     Call MD for:  difficulty breathing, headache or visual disturbances   Complete by: As directed    Call MD for:  extreme fatigue   Complete by: As directed    Call MD for:  hives   Complete by: As directed    Call MD for:  persistant dizziness or light-headedness   Complete by: As directed    Call MD for:  persistant nausea and vomiting   Complete by: As directed    Call MD for:  severe uncontrolled pain   Complete by: As directed    Call MD for:  temperature >100.4   Complete by: As directed    Diet general   Complete by: As directed    Discharge instructions   Complete by: As directed    Recommendations at discharge:   Complete course of antibiotics with oral Augmentin .    Continue to monitor for any obvious blood loss or black stool  Follow-up with your GI doctor at [facility] in 1 to 2 weeks.  General discharge instructions: Follow with Primary MD Karolee Faden, MD in 7 days  Please request your PCP  to go over your hospital tests, procedures, radiology results at the follow up. Please get your medicines reviewed and adjusted.  Your PCP may decide to repeat certain labs or tests as needed. Do not drive, operate heavy machinery, perform activities at heights, swimming or participation in water  activities or provide baby sitting services if your were admitted for syncope or siezures until you have seen by Primary MD or a Neurologist and advised to do so again. [place]  Controlled Substance  Reporting System database was reviewed. Do not drive, operate heavy machinery, perform activities at heights, swim, participate in water  activities or provide baby-sitting services while on medications for pain, sleep and mood until your outpatient physician has reevaluated you and advised to do so again.  You are strongly recommended to comply with the dose, frequency and duration of prescribed medications. Activity: As tolerated with Full fall precautions use walker/cane & assistance as needed Avoid using any recreational substances like cigarette, tobacco, alcohol, or non-prescribed drug. If you experience worsening of your admission symptoms, develop shortness of breath, life threatening emergency, suicidal or homicidal thoughts you must seek medical attention immediately by calling 911 or calling your MD immediately  if symptoms less severe. You must read complete instructions/literature along with all the possible adverse reactions/side effects for all the medicines you take and that have been prescribed to you. Take any new medicine only after you have completely understood and accepted all the possible adverse reactions/side effects.  Wear Seat belts while driving. You were cared for by a hospitalist during your hospital stay. If you have any questions about your discharge medications or the care you received while you were in the hospital after you are discharged, you can call the unit and ask to speak with the hospitalist or the covering physician. Once you are discharged, your primary care physician will handle any further medical issues. Please note that NO REFILLS for any discharge medications will be authorized once you are discharged, as it is imperative that you return to your primary care physician (or establish a relationship with a primary care physician if you do not have one).   Increase activity slowly   Complete by: As directed        Discharge Exam:   Vitals:   12/04/24 2120  12/05/24 0608 12/05/24 1030 12/05/24 1102  BP: (!) 93/46 (!) 103/59 116/66 122/73  Pulse: 87 85 (!) 108 (!) 110  Resp: 15 16 18 18   Temp: 98.6 F (37 C) 98 F (36.7 C) 98.3 F (36.8 C) 98.3 F (36.8 C)  TempSrc: Oral Oral Oral   SpO2: 100% 95% 100%     There is no height or weight on file to calculate BMI.   General exam: Pleasant, young African-American female Skin: No rashes, lesions or ulcers. HEENT: Atraumatic, normocephalic, no obvious bleeding Lungs: Clear to auscultation bilaterally,  CVS:  S1, S2, no murmur,   GI/Abd: Soft, significantly improved epigastric tenderness, nondistended, bowel sound present, ileostomy bag with liquid stool. CNS: Alert, awake, oriented x 3 Psychiatry: Mood appropriate Extremities: No pedal edema, no calf tenderness,    The results of significant diagnostics from this hospitalization (including imaging, microbiology, ancillary and laboratory) are listed below for reference.    Procedures and Diagnostic Studies:   DG Chest Portable 1 View Result Date: 12/03/2024 EXAM: 1 VIEW(S) XRAY OF THE CHEST 12/03/2024 05:44:00 PM COMPARISON: 01/10/2024 CLINICAL HISTORY: pain FINDINGS: LINES, TUBES AND DEVICES: Right IJ Port-A-Cath in place with tip near the superior cavoatrial junction. LUNGS AND PLEURA: Left basilar atelectasis. No pleural effusion. No pneumothorax. HEART AND MEDIASTINUM: No acute abnormality of the cardiac and mediastinal silhouettes. BONES AND SOFT TISSUES: No acute osseous abnormality. IMPRESSION: 1. Left basilar atelectasis. Electronically signed by: Greig Pique MD MD 12/03/2024 05:55 PM EST RP Workstation: [id number]   CT ABDOMEN PELVIS WO CONTRAST Result Date: 12/03/2024 EXAM: CT ABDOMEN AND PELVIS WITHOUT CONTRAST 12/03/2024 05:12:48 PM TECHNIQUE: CT of the abdomen and pelvis was performed without the administration of intravenous contrast. Multiplanar reformatted images are provided for review. Automated exposure control, iterative  reconstruction, and/or weight-based adjustment of the mA/kV was utilized to reduce the radiation dose to as low as reasonably achievable. COMPARISON: 02/22/2024 CLINICAL HISTORY: Abdominal pain, acute, nonlocalized. FINDINGS: LOWER CHEST: Multiple small clustered ground glass airspace opacities in the left lower lobe. Subsegmental atelectasis in the lingula and left lower lobe. LIVER: Focal fatty infiltration along the falciform ligament. GALLBLADDER AND BILE DUCTS: Gallbladder is unremarkable. No biliary ductal dilatation. SPLEEN: No acute abnormality. PANCREAS: No acute abnormality. ADRENAL GLANDS: No acute abnormality. KIDNEYS, URETERS AND BLADDER: Unchanged 2 mm left renal cyst. No stones in the kidneys or ureters. No hydronephrosis. No perinephric or periureteral stranding. Decompressed urinary bladder. Per consensus, no follow-up is needed for simple Bosniak type 1 and 2 renal cysts, unless the patient has a malignancy history or risk factors. GI AND BOWEL: Postsurgical changes consistent with a subtotal colectomy. Right lower quadrant end ileostomy. Stomach demonstrates no acute abnormality. There is no bowel obstruction. PERITONEUM AND RETROPERITONEUM: No ascites. No free air. VASCULATURE: Aorta is normal in caliber. LYMPH NODES: No lymphadenopathy. REPRODUCTIVE ORGANS: The uterus and ovaries are within normal limits for patient's age. BONES AND SOFT TISSUES: No acute osseous abnormality. No focal soft tissue abnormality. IMPRESSION: 1. No acute findings in the abdomen or pelvis. 2. Multiple small clustered ground glass airspace opacities in the left lower lobe, worrisome for developing bronchopneumonia . Electronically signed by: Rogelia Myers MD MD 12/03/2024 05:24 PM EST RP Workstation: [id number]     Labs:   Basic Metabolic Panel: Recent Labs  Lab 12/03/24 1600 12/04/24 0127 12/04/24 1747 12/05/24 0413  NA 131* 133* 137 137  K 3.2* 2.8* 3.4* 3.8  CL 95* 102 107 109  CO2 19* 20* 20* 20*   GLUCOSE 101* 99 93 100*  BUN 34* 28* 21* 17  CREATININE 2.55* 2.24* 2.14* 1.90*  CALCIUM  9.3 8.6* 8.0* 8.6*   GFR CrCl cannot be calculated (Unknown ideal weight.). Liver Function Tests: Recent Labs  Lab 12/03/24 1600 12/04/24 0127 12/05/24 0413  AST 16 15 14*  ALT 9 7 6   ALKPHOS 151* 119 93  BILITOT 0.2 0.2 <0.2  PROT 8.1 6.3* 5.4*  ALBUMIN  4.4 3.5 3.1*   Recent Labs  Lab 12/03/24 1600 12/04/24 0830 12/05/24 0413  LIPASE 590* 166* 98*   No results for input(s): AMMONIA  in the last 168 hours. Coagulation profile No results for input(s): INR, PROTIME in the last 168 hours.  CBC: Recent Labs  Lab 12/03/24 1600 12/04/24 0127 12/05/24 0413 12/05/24 0624  WBC 9.5 9.5 6.1  --   NEUTROABS  --   --  3.9  --   HGB 9.3* 7.6* 6.3* 6.4*  HCT 29.4* 24.5* 20.9* 21.1*  MCV 67.9* 70.0* 71.6*  --   PLT 481* 372 263  --    Cardiac Enzymes: No results for input(s): CKTOTAL, CKMB, CKMBINDEX, TROPONINI in the last 168 hours. BNP: Invalid input(s): POCBNP CBG: No results for input(s): GLUCAP in the last 168 hours. D-Dimer No results for input(s): DDIMER in the last 72 hours. Hgb A1c No results for input(s): HGBA1C in the last 72 hours. Lipid Profile Recent Labs    12/04/24 0535  TRIG 123   Thyroid function studies No results for input(s): TSH, T4TOTAL, T3FREE, THYROIDAB in the last 72 hours.  Invalid input(s): FREET3 Anemia work up No results for input(s): VITAMINB12, FOLATE, FERRITIN, TIBC, IRON , RETICCTPCT in the last 72 hours. Microbiology No results found for this or any previous visit (from the past 240 hours).  Time coordinating discharge: 45 minutes  Signed: Luvena Wentling  [facility] 12/05/2024, 12:56 PM   "

## 2024-12-05 NOTE — Progress Notes (Signed)
 Discharge med in  a secure bag delivered to patient by this RN

## 2024-12-05 NOTE — Plan of Care (Signed)

## 2024-12-05 NOTE — Progress Notes (Signed)
Nurse reviewed discharge instructions with pt.  Pt verbalized understanding of discharge instructions, follow up appointments and new medications.  No concerns at time of discharge. 

## 2024-12-08 LAB — TYPE AND SCREEN
ABO/RH(D): A POS
Antibody Screen: NEGATIVE
Unit division: 0

## 2024-12-08 LAB — BPAM RBC
Blood Product Expiration Date: 202602082359
ISSUE DATE / TIME: 202601091037
Unit Type and Rh: 6200
